# Patient Record
Sex: Male | Born: 1940 | Race: White | Hispanic: No | Marital: Married | Smoking: Former smoker
Health system: Southern US, Community
[De-identification: ages and names within clinical notes are randomized; demographics above are authoritative.]

## PROBLEM LIST (undated history)

## (undated) DIAGNOSIS — I340 Nonrheumatic mitral (valve) insufficiency: Secondary | ICD-10-CM

## (undated) DIAGNOSIS — E785 Hyperlipidemia, unspecified: Secondary | ICD-10-CM

## (undated) DIAGNOSIS — I350 Nonrheumatic aortic (valve) stenosis: Secondary | ICD-10-CM

## (undated) DIAGNOSIS — I2510 Atherosclerotic heart disease of native coronary artery without angina pectoris: Secondary | ICD-10-CM

## (undated) DIAGNOSIS — I10 Essential (primary) hypertension: Secondary | ICD-10-CM

## (undated) DIAGNOSIS — M1990 Unspecified osteoarthritis, unspecified site: Secondary | ICD-10-CM

## (undated) DIAGNOSIS — E119 Type 2 diabetes mellitus without complications: Secondary | ICD-10-CM

## (undated) DIAGNOSIS — K219 Gastro-esophageal reflux disease without esophagitis: Secondary | ICD-10-CM

## (undated) DIAGNOSIS — K259 Gastric ulcer, unspecified as acute or chronic, without hemorrhage or perforation: Secondary | ICD-10-CM

## (undated) DIAGNOSIS — I219 Acute myocardial infarction, unspecified: Secondary | ICD-10-CM

## (undated) DIAGNOSIS — D509 Iron deficiency anemia, unspecified: Secondary | ICD-10-CM

## (undated) DIAGNOSIS — I5030 Unspecified diastolic (congestive) heart failure: Secondary | ICD-10-CM

## (undated) DIAGNOSIS — F419 Anxiety disorder, unspecified: Secondary | ICD-10-CM

## (undated) DIAGNOSIS — K2970 Gastritis, unspecified, without bleeding: Secondary | ICD-10-CM

## (undated) DIAGNOSIS — G8929 Other chronic pain: Secondary | ICD-10-CM

## (undated) DIAGNOSIS — E782 Mixed hyperlipidemia: Secondary | ICD-10-CM

## (undated) DIAGNOSIS — N183 Chronic kidney disease, stage 3 unspecified: Secondary | ICD-10-CM

## (undated) DIAGNOSIS — N200 Calculus of kidney: Secondary | ICD-10-CM

## (undated) HISTORY — PX: OTHER SURGICAL HISTORY: SHX169

## (undated) HISTORY — DX: Nonrheumatic aortic (valve) stenosis: I35.0

## (undated) HISTORY — DX: Nonrheumatic mitral (valve) insufficiency: I34.0

## (undated) HISTORY — DX: Unspecified osteoarthritis, unspecified site: M19.90

## (undated) HISTORY — DX: Atherosclerotic heart disease of native coronary artery without angina pectoris: I25.10

## (undated) HISTORY — DX: Iron deficiency anemia, unspecified: D50.9

## (undated) HISTORY — DX: Gastro-esophageal reflux disease without esophagitis: K21.9

## (undated) HISTORY — DX: Gastritis, unspecified, without bleeding: K29.70

## (undated) HISTORY — PX: APPENDECTOMY: SHX54

## (undated) HISTORY — DX: Mixed hyperlipidemia: E78.2

## (undated) HISTORY — DX: Calculus of kidney: N20.0

## (undated) HISTORY — DX: Essential (primary) hypertension: I10

## (undated) HISTORY — DX: Hyperlipidemia, unspecified: E78.5

## (undated) HISTORY — DX: Unspecified diastolic (congestive) heart failure: I50.30

## (undated) HISTORY — DX: Chronic kidney disease, stage 3 unspecified: N18.30

## (undated) HISTORY — DX: Anxiety disorder, unspecified: F41.9

## (undated) HISTORY — DX: Other chronic pain: G89.29

## (undated) HISTORY — DX: Type 2 diabetes mellitus without complications: E11.9

---

## 2001-12-15 ENCOUNTER — Ambulatory Visit (HOSPITAL_COMMUNITY): Admission: RE | Admit: 2001-12-15 | Discharge: 2001-12-17 | Payer: Self-pay | Admitting: Cardiology

## 2001-12-15 ENCOUNTER — Encounter: Payer: Self-pay | Admitting: Cardiology

## 2001-12-16 ENCOUNTER — Encounter: Payer: Self-pay | Admitting: Cardiology

## 2002-01-02 HISTORY — PX: CARDIAC CATHETERIZATION: SHX172

## 2004-07-04 ENCOUNTER — Ambulatory Visit: Payer: Self-pay | Admitting: Cardiology

## 2005-06-30 ENCOUNTER — Ambulatory Visit: Payer: Self-pay | Admitting: Cardiology

## 2006-12-09 ENCOUNTER — Ambulatory Visit: Payer: Self-pay | Admitting: Cardiology

## 2006-12-20 ENCOUNTER — Encounter: Payer: Self-pay | Admitting: Physician Assistant

## 2006-12-20 ENCOUNTER — Ambulatory Visit: Payer: Self-pay | Admitting: Cardiology

## 2007-05-05 HISTORY — PX: OTHER SURGICAL HISTORY: SHX169

## 2007-12-09 ENCOUNTER — Ambulatory Visit: Payer: Self-pay | Admitting: Cardiology

## 2007-12-26 ENCOUNTER — Inpatient Hospital Stay (HOSPITAL_COMMUNITY): Admission: RE | Admit: 2007-12-26 | Discharge: 2007-12-29 | Payer: Self-pay | Admitting: Orthopedic Surgery

## 2008-02-20 ENCOUNTER — Encounter: Admission: RE | Admit: 2008-02-20 | Discharge: 2008-02-20 | Payer: Self-pay | Admitting: Orthopedic Surgery

## 2008-05-10 ENCOUNTER — Encounter (INDEPENDENT_AMBULATORY_CARE_PROVIDER_SITE_OTHER): Payer: Self-pay | Admitting: Orthopedic Surgery

## 2008-05-10 ENCOUNTER — Ambulatory Visit (HOSPITAL_COMMUNITY): Admission: RE | Admit: 2008-05-10 | Discharge: 2008-05-10 | Payer: Self-pay | Admitting: Orthopedic Surgery

## 2008-05-10 ENCOUNTER — Ambulatory Visit: Payer: Self-pay | Admitting: Vascular Surgery

## 2008-05-16 ENCOUNTER — Encounter: Payer: Self-pay | Admitting: Cardiology

## 2008-05-23 ENCOUNTER — Ambulatory Visit: Payer: Self-pay | Admitting: Gastroenterology

## 2008-06-01 ENCOUNTER — Ambulatory Visit: Payer: Self-pay | Admitting: Internal Medicine

## 2008-06-01 ENCOUNTER — Encounter: Payer: Self-pay | Admitting: Internal Medicine

## 2008-06-01 ENCOUNTER — Ambulatory Visit (HOSPITAL_COMMUNITY): Admission: RE | Admit: 2008-06-01 | Discharge: 2008-06-01 | Payer: Self-pay | Admitting: Internal Medicine

## 2008-06-11 ENCOUNTER — Ambulatory Visit (HOSPITAL_COMMUNITY): Admission: RE | Admit: 2008-06-11 | Discharge: 2008-06-11 | Payer: Self-pay | Admitting: Internal Medicine

## 2008-06-14 ENCOUNTER — Encounter: Payer: Self-pay | Admitting: Cardiology

## 2008-06-28 ENCOUNTER — Inpatient Hospital Stay (HOSPITAL_COMMUNITY): Admission: AD | Admit: 2008-06-28 | Discharge: 2008-06-30 | Payer: Self-pay | Admitting: Orthopedic Surgery

## 2008-07-03 ENCOUNTER — Encounter: Payer: Self-pay | Admitting: Cardiology

## 2008-07-23 ENCOUNTER — Encounter: Payer: Self-pay | Admitting: Cardiology

## 2008-07-26 ENCOUNTER — Encounter: Payer: Self-pay | Admitting: Cardiology

## 2008-07-30 ENCOUNTER — Encounter: Payer: Self-pay | Admitting: Cardiology

## 2008-08-17 ENCOUNTER — Encounter (INDEPENDENT_AMBULATORY_CARE_PROVIDER_SITE_OTHER): Payer: Self-pay | Admitting: *Deleted

## 2008-09-04 ENCOUNTER — Ambulatory Visit: Payer: Self-pay | Admitting: Cardiology

## 2008-11-12 ENCOUNTER — Inpatient Hospital Stay (HOSPITAL_COMMUNITY): Admission: RE | Admit: 2008-11-12 | Discharge: 2008-11-16 | Payer: Self-pay | Admitting: Orthopedic Surgery

## 2008-12-07 ENCOUNTER — Encounter: Payer: Self-pay | Admitting: Internal Medicine

## 2008-12-10 ENCOUNTER — Encounter: Payer: Self-pay | Admitting: Cardiology

## 2008-12-17 ENCOUNTER — Encounter: Payer: Self-pay | Admitting: Cardiology

## 2008-12-19 ENCOUNTER — Encounter: Payer: Self-pay | Admitting: Cardiology

## 2008-12-31 ENCOUNTER — Ambulatory Visit: Payer: Self-pay | Admitting: Infectious Diseases

## 2008-12-31 ENCOUNTER — Inpatient Hospital Stay (HOSPITAL_COMMUNITY): Admission: RE | Admit: 2008-12-31 | Discharge: 2009-01-08 | Payer: Self-pay | Admitting: Orthopedic Surgery

## 2008-12-31 ENCOUNTER — Encounter (INDEPENDENT_AMBULATORY_CARE_PROVIDER_SITE_OTHER): Payer: Self-pay | Admitting: Orthopedic Surgery

## 2009-01-04 ENCOUNTER — Encounter: Payer: Self-pay | Admitting: Internal Medicine

## 2009-01-11 ENCOUNTER — Encounter: Payer: Self-pay | Admitting: Cardiology

## 2009-02-01 ENCOUNTER — Encounter: Payer: Self-pay | Admitting: Cardiology

## 2009-02-04 ENCOUNTER — Encounter: Payer: Self-pay | Admitting: Cardiology

## 2009-02-08 ENCOUNTER — Encounter: Payer: Self-pay | Admitting: Cardiology

## 2009-02-18 DIAGNOSIS — I350 Nonrheumatic aortic (valve) stenosis: Secondary | ICD-10-CM | POA: Insufficient documentation

## 2009-04-03 ENCOUNTER — Ambulatory Visit: Payer: Self-pay | Admitting: Cardiology

## 2009-04-03 DIAGNOSIS — E782 Mixed hyperlipidemia: Secondary | ICD-10-CM | POA: Insufficient documentation

## 2009-04-03 DIAGNOSIS — I2510 Atherosclerotic heart disease of native coronary artery without angina pectoris: Secondary | ICD-10-CM | POA: Insufficient documentation

## 2009-04-03 DIAGNOSIS — I10 Essential (primary) hypertension: Secondary | ICD-10-CM | POA: Insufficient documentation

## 2009-04-05 ENCOUNTER — Encounter: Payer: Self-pay | Admitting: Cardiology

## 2009-04-10 ENCOUNTER — Telehealth (INDEPENDENT_AMBULATORY_CARE_PROVIDER_SITE_OTHER): Payer: Self-pay | Admitting: *Deleted

## 2010-08-08 LAB — CBC
HCT: 22.7 % — ABNORMAL LOW (ref 39.0–52.0)
HCT: 23.4 % — ABNORMAL LOW (ref 39.0–52.0)
HCT: 23.4 % — ABNORMAL LOW (ref 39.0–52.0)
HCT: 28 % — ABNORMAL LOW (ref 39.0–52.0)
HCT: 30.2 % — ABNORMAL LOW (ref 39.0–52.0)
Hemoglobin: 10.4 g/dL — ABNORMAL LOW (ref 13.0–17.0)
Hemoglobin: 7.7 g/dL — CL (ref 13.0–17.0)
Hemoglobin: 8.1 g/dL — ABNORMAL LOW (ref 13.0–17.0)
Hemoglobin: 8.2 g/dL — ABNORMAL LOW (ref 13.0–17.0)
Hemoglobin: 9.5 g/dL — ABNORMAL LOW (ref 13.0–17.0)
MCHC: 33.9 g/dL (ref 30.0–36.0)
MCHC: 34.7 g/dL (ref 30.0–36.0)
MCV: 91 fL (ref 78.0–100.0)
MCV: 91.2 fL (ref 78.0–100.0)
MCV: 93.2 fL (ref 78.0–100.0)
Platelets: 169 10*3/uL (ref 150–400)
Platelets: 187 10*3/uL (ref 150–400)
RBC: 2.79 MIL/uL — ABNORMAL LOW (ref 4.22–5.81)
RBC: 3.08 MIL/uL — ABNORMAL LOW (ref 4.22–5.81)
RDW: 15.3 % (ref 11.5–15.5)
RDW: 15.9 % — ABNORMAL HIGH (ref 11.5–15.5)
RDW: 16.2 % — ABNORMAL HIGH (ref 11.5–15.5)
RDW: 16.4 % — ABNORMAL HIGH (ref 11.5–15.5)
WBC: 4.8 10*3/uL (ref 4.0–10.5)
WBC: 5.7 10*3/uL (ref 4.0–10.5)
WBC: 7.5 10*3/uL (ref 4.0–10.5)

## 2010-08-08 LAB — GLUCOSE, CAPILLARY
Glucose-Capillary: 105 mg/dL — ABNORMAL HIGH (ref 70–99)
Glucose-Capillary: 111 mg/dL — ABNORMAL HIGH (ref 70–99)
Glucose-Capillary: 115 mg/dL — ABNORMAL HIGH (ref 70–99)
Glucose-Capillary: 121 mg/dL — ABNORMAL HIGH (ref 70–99)
Glucose-Capillary: 122 mg/dL — ABNORMAL HIGH (ref 70–99)
Glucose-Capillary: 143 mg/dL — ABNORMAL HIGH (ref 70–99)
Glucose-Capillary: 148 mg/dL — ABNORMAL HIGH (ref 70–99)
Glucose-Capillary: 154 mg/dL — ABNORMAL HIGH (ref 70–99)
Glucose-Capillary: 165 mg/dL — ABNORMAL HIGH (ref 70–99)
Glucose-Capillary: 173 mg/dL — ABNORMAL HIGH (ref 70–99)
Glucose-Capillary: 183 mg/dL — ABNORMAL HIGH (ref 70–99)
Glucose-Capillary: 78 mg/dL (ref 70–99)
Glucose-Capillary: 83 mg/dL (ref 70–99)
Glucose-Capillary: 98 mg/dL (ref 70–99)

## 2010-08-08 LAB — CROSSMATCH: ABO/RH(D): AB NEG

## 2010-08-08 LAB — BASIC METABOLIC PANEL
BUN: 10 mg/dL (ref 6–23)
BUN: 12 mg/dL (ref 6–23)
BUN: 17 mg/dL (ref 6–23)
CO2: 27 mEq/L (ref 19–32)
Calcium: 8.5 mg/dL (ref 8.4–10.5)
Calcium: 8.8 mg/dL (ref 8.4–10.5)
Calcium: 8.8 mg/dL (ref 8.4–10.5)
Chloride: 105 mEq/L (ref 96–112)
Chloride: 107 mEq/L (ref 96–112)
Chloride: 108 mEq/L (ref 96–112)
Creatinine, Ser: 0.87 mg/dL (ref 0.4–1.5)
GFR calc Af Amer: >60 mL/min (ref >60)
GFR calc Af Amer: >60 mL/min (ref >60)
GFR calc Af Amer: >60 mL/min (ref >60)
GFR calc non Af Amer: >60 mL/min (ref >60)
GFR calc non Af Amer: >60 mL/min (ref >60)
GFR calc non Af Amer: >60 mL/min (ref >60)
Glucose, Bld: 100 mg/dL — ABNORMAL HIGH (ref 70–99)
Glucose, Bld: 140 mg/dL — ABNORMAL HIGH (ref 70–99)
Potassium: 4.1 mEq/L (ref 3.5–5.1)
Potassium: 4.1 mEq/L (ref 3.5–5.1)
Potassium: 4.4 mEq/L (ref 3.5–5.1)
Potassium: 4.6 mEq/L (ref 3.5–5.1)
Sodium: 137 mEq/L (ref 135–145)
Sodium: 138 mEq/L (ref 135–145)
Sodium: 139 mEq/L (ref 135–145)
Sodium: 140 mEq/L (ref 135–145)

## 2010-08-08 LAB — PROTIME-INR
INR: 1.1 (ref 0.00–1.49)
INR: 1.1 (ref 0.00–1.49)
INR: 2.9 — ABNORMAL HIGH (ref 0.00–1.49)
Prothrombin Time: 14.2 seconds (ref 11.6–15.2)
Prothrombin Time: 29.9 seconds — ABNORMAL HIGH (ref 11.6–15.2)

## 2010-08-08 LAB — PREPARE RBC (CROSSMATCH)

## 2010-08-08 LAB — APTT: aPTT: 42 seconds — ABNORMAL HIGH (ref 24–37)

## 2010-08-09 LAB — BASIC METABOLIC PANEL
BUN: 26 mg/dL — ABNORMAL HIGH (ref 6–23)
Chloride: 105 mEq/L (ref 96–112)
Creatinine, Ser: 1.19 mg/dL (ref 0.4–1.5)
Glucose, Bld: 120 mg/dL — ABNORMAL HIGH (ref 70–99)

## 2010-08-09 LAB — GLUCOSE, CAPILLARY
Glucose-Capillary: 114 mg/dL — ABNORMAL HIGH (ref 70–99)
Glucose-Capillary: 169 mg/dL — ABNORMAL HIGH (ref 70–99)
Glucose-Capillary: 171 mg/dL — ABNORMAL HIGH (ref 70–99)
Glucose-Capillary: 94 mg/dL (ref 70–99)
Glucose-Capillary: 99 mg/dL (ref 70–99)
Glucose-Capillary: 99 mg/dL (ref 70–99)

## 2010-08-09 LAB — TYPE AND SCREEN

## 2010-08-09 LAB — URINALYSIS, ROUTINE W REFLEX MICROSCOPIC
Ketones, ur: NEGATIVE mg/dL
Nitrite: NEGATIVE
Protein, ur: NEGATIVE mg/dL
Urobilinogen, UA: 0.2 mg/dL (ref 0.0–1.0)
pH: 5.5 (ref 5.0–8.0)

## 2010-08-09 LAB — ANAEROBIC CULTURE

## 2010-08-09 LAB — CBC
HCT: 24.4 % — ABNORMAL LOW (ref 39.0–52.0)
MCHC: 33.7 g/dL (ref 30.0–36.0)
MCHC: 33.7 g/dL (ref 30.0–36.0)
MCV: 89.9 fL (ref 78.0–100.0)
MCV: 90.2 fL (ref 78.0–100.0)
Platelets: 244 10*3/uL (ref 150–400)
RBC: 3.71 MIL/uL — ABNORMAL LOW (ref 4.22–5.81)
RDW: 17 % — ABNORMAL HIGH (ref 11.5–15.5)
WBC: 8.8 10*3/uL (ref 4.0–10.5)

## 2010-08-09 LAB — COMPREHENSIVE METABOLIC PANEL
AST: 21 U/L (ref 0–37)
CO2: 31 mEq/L (ref 19–32)
Calcium: 9.4 mg/dL (ref 8.4–10.5)
Creatinine, Ser: 1.03 mg/dL (ref 0.4–1.5)
GFR calc Af Amer: >60 mL/min (ref >60)
GFR calc non Af Amer: >60 mL/min (ref >60)

## 2010-08-09 LAB — BODY FLUID CULTURE

## 2010-08-09 LAB — APTT: aPTT: 29 seconds (ref 24–37)

## 2010-08-09 LAB — PROTIME-INR: Prothrombin Time: 13 seconds (ref 11.6–15.2)

## 2010-08-09 LAB — GRAM STAIN

## 2010-08-10 LAB — CBC
HCT: 23 % — ABNORMAL LOW (ref 39.0–52.0)
HCT: 28.9 % — ABNORMAL LOW (ref 39.0–52.0)
Hemoglobin: 9.1 g/dL — ABNORMAL LOW (ref 13.0–17.0)
Hemoglobin: 9.6 g/dL — ABNORMAL LOW (ref 13.0–17.0)
MCHC: 33.1 g/dL (ref 30.0–36.0)
MCHC: 33.6 g/dL (ref 30.0–36.0)
MCHC: 34 g/dL (ref 30.0–36.0)
MCV: 84.8 fL (ref 78.0–100.0)
MCV: 85.8 fL (ref 78.0–100.0)
Platelets: 290 10*3/uL (ref 150–400)
Platelets: 341 10*3/uL (ref 150–400)
RBC: 3.46 MIL/uL — ABNORMAL LOW (ref 4.22–5.81)
RDW: 17 % — ABNORMAL HIGH (ref 11.5–15.5)
RDW: 17.3 % — ABNORMAL HIGH (ref 11.5–15.5)
WBC: 7.9 10*3/uL (ref 4.0–10.5)
WBC: 8.3 10*3/uL (ref 4.0–10.5)

## 2010-08-10 LAB — CROSSMATCH

## 2010-08-10 LAB — PROTIME-INR
INR: 1.1 (ref 0.00–1.49)
INR: 2.6 — ABNORMAL HIGH (ref 0.00–1.49)
Prothrombin Time: 33.2 seconds — ABNORMAL HIGH (ref 11.6–15.2)
Prothrombin Time: 14.2 seconds (ref 11.6–15.2)
Prothrombin Time: 15.3 seconds — ABNORMAL HIGH (ref 11.6–15.2)
Prothrombin Time: 29.6 seconds — ABNORMAL HIGH (ref 11.6–15.2)

## 2010-08-10 LAB — URINALYSIS, ROUTINE W REFLEX MICROSCOPIC
Bilirubin Urine: NEGATIVE
Hgb urine dipstick: NEGATIVE
Ketones, ur: NEGATIVE mg/dL
Protein, ur: NEGATIVE mg/dL
Urobilinogen, UA: 0.2 mg/dL (ref 0.0–1.0)
pH: 5.5 (ref 5.0–8.0)

## 2010-08-10 LAB — GLUCOSE, CAPILLARY
Glucose-Capillary: 100 mg/dL — ABNORMAL HIGH (ref 70–99)
Glucose-Capillary: 123 mg/dL — ABNORMAL HIGH (ref 70–99)
Glucose-Capillary: 152 mg/dL — ABNORMAL HIGH (ref 70–99)
Glucose-Capillary: 157 mg/dL — ABNORMAL HIGH (ref 70–99)
Glucose-Capillary: 180 mg/dL — ABNORMAL HIGH (ref 70–99)

## 2010-08-10 LAB — COMPREHENSIVE METABOLIC PANEL
Alkaline Phosphatase: 101 U/L (ref 39–117)
BUN: 26 mg/dL — ABNORMAL HIGH (ref 6–23)
CO2: 29 mEq/L (ref 19–32)
Calcium: 9.2 mg/dL (ref 8.4–10.5)
Chloride: 102 mEq/L (ref 96–112)
Creatinine, Ser: 0.95 mg/dL (ref 0.4–1.5)
GFR calc non Af Amer: >60 mL/min (ref >60)
Glucose, Bld: 226 mg/dL — ABNORMAL HIGH (ref 70–99)
Potassium: 5 mEq/L (ref 3.5–5.1)
Sodium: 139 mEq/L (ref 135–145)
Total Bilirubin: 0.3 mg/dL (ref 0.3–1.2)
Total Protein: 6.9 g/dL (ref 6.0–8.3)

## 2010-08-10 LAB — BASIC METABOLIC PANEL
BUN: 13 mg/dL (ref 6–23)
BUN: 15 mg/dL (ref 6–23)
CO2: 28 mEq/L (ref 19–32)
CO2: 29 mEq/L (ref 19–32)
Calcium: 8.9 mg/dL (ref 8.4–10.5)
Calcium: 9.3 mg/dL (ref 8.4–10.5)
Chloride: 105 mEq/L (ref 96–112)
Creatinine, Ser: 0.78 mg/dL (ref 0.4–1.5)
Creatinine, Ser: 0.97 mg/dL (ref 0.4–1.5)
GFR calc Af Amer: >60 mL/min (ref >60)
GFR calc non Af Amer: >60 mL/min (ref >60)
Glucose, Bld: 144 mg/dL — ABNORMAL HIGH (ref 70–99)
Glucose, Bld: 86 mg/dL (ref 70–99)
Potassium: 4 mEq/L (ref 3.5–5.1)

## 2010-08-10 LAB — ANAEROBIC CULTURE

## 2010-08-10 LAB — CULTURE, ROUTINE-ABSCESS

## 2010-08-10 LAB — TISSUE CULTURE

## 2010-08-10 LAB — VANCOMYCIN, TROUGH: Vancomycin Tr: 11.6 ug/mL (ref 10.0–20.0)

## 2010-08-18 LAB — GLUCOSE, CAPILLARY: Glucose-Capillary: 131 mg/dL — ABNORMAL HIGH (ref 70–99)

## 2010-08-19 LAB — CBC
HCT: 28 % — ABNORMAL LOW (ref 39.0–52.0)
Hemoglobin: 8 g/dL — ABNORMAL LOW (ref 13.0–17.0)
Hemoglobin: 9.3 g/dL — ABNORMAL LOW (ref 13.0–17.0)
Hemoglobin: 9.6 g/dL — ABNORMAL LOW (ref 13.0–17.0)
MCHC: 32.5 g/dL (ref 30.0–36.0)
MCHC: 32.5 g/dL (ref 30.0–36.0)
MCV: 80.5 fL (ref 78.0–100.0)
MCV: 82 fL (ref 78.0–100.0)
Platelets: 314 10*3/uL (ref 150–400)
Platelets: 398 10*3/uL (ref 150–400)
RBC: 3.04 MIL/uL — ABNORMAL LOW (ref 4.22–5.81)
RDW: 19 % — ABNORMAL HIGH (ref 11.5–15.5)
RDW: 19.1 % — ABNORMAL HIGH (ref 11.5–15.5)
RDW: 20.2 % — ABNORMAL HIGH (ref 11.5–15.5)
WBC: 8.1 10*3/uL (ref 4.0–10.5)

## 2010-08-19 LAB — BASIC METABOLIC PANEL
BUN: 14 mg/dL (ref 6–23)
CO2: 28 mEq/L (ref 19–32)
CO2: 29 mEq/L (ref 19–32)
Calcium: 8.7 mg/dL (ref 8.4–10.5)
Calcium: 8.7 mg/dL (ref 8.4–10.5)
Chloride: 101 mEq/L (ref 96–112)
Chloride: 103 mEq/L (ref 96–112)
Creatinine, Ser: 0.85 mg/dL (ref 0.4–1.5)
Creatinine, Ser: 0.88 mg/dL (ref 0.4–1.5)
GFR calc Af Amer: >60 mL/min (ref >60)
GFR calc non Af Amer: >60 mL/min (ref >60)
GFR calc non Af Amer: >60 mL/min (ref >60)
Glucose, Bld: 66 mg/dL — ABNORMAL LOW (ref 70–99)
Glucose, Bld: 82 mg/dL (ref 70–99)
Potassium: 3.9 mEq/L (ref 3.5–5.1)
Sodium: 136 mEq/L (ref 135–145)
Sodium: 136 mEq/L (ref 135–145)

## 2010-08-19 LAB — COMPREHENSIVE METABOLIC PANEL
AST: 15 U/L (ref 0–37)
Albumin: 2.9 g/dL — ABNORMAL LOW (ref 3.5–5.2)
BUN: 18 mg/dL (ref 6–23)
Calcium: 9.4 mg/dL (ref 8.4–10.5)
Creatinine, Ser: 0.84 mg/dL (ref 0.4–1.5)
GFR calc Af Amer: >60 mL/min (ref >60)
GFR calc non Af Amer: >60 mL/min (ref >60)

## 2010-08-19 LAB — WOUND CULTURE: Culture: NO GROWTH

## 2010-08-19 LAB — CROSSMATCH: ABO/RH(D): AB NEG

## 2010-08-19 LAB — URINALYSIS, ROUTINE W REFLEX MICROSCOPIC
Bilirubin Urine: NEGATIVE
Hgb urine dipstick: NEGATIVE
Ketones, ur: NEGATIVE mg/dL
Protein, ur: NEGATIVE mg/dL
Urobilinogen, UA: 0.2 mg/dL (ref 0.0–1.0)

## 2010-08-19 LAB — GLUCOSE, CAPILLARY
Glucose-Capillary: 101 mg/dL — ABNORMAL HIGH (ref 70–99)
Glucose-Capillary: 140 mg/dL — ABNORMAL HIGH (ref 70–99)
Glucose-Capillary: 161 mg/dL — ABNORMAL HIGH (ref 70–99)
Glucose-Capillary: 163 mg/dL — ABNORMAL HIGH (ref 70–99)
Glucose-Capillary: 76 mg/dL (ref 70–99)
Glucose-Capillary: 89 mg/dL (ref 70–99)
Glucose-Capillary: 92 mg/dL (ref 70–99)

## 2010-08-19 LAB — ANAEROBIC CULTURE

## 2010-08-19 LAB — PROTIME-INR: INR: 1.1 (ref 0.00–1.49)

## 2010-08-19 LAB — APTT: aPTT: 46 seconds — ABNORMAL HIGH (ref 24–37)

## 2010-08-19 LAB — HEMOCCULT GUIAC POC 1CARD (OFFICE): Fecal Occult Bld: NEGATIVE

## 2010-08-19 LAB — CREATININE, SERUM
Creatinine, Ser: 0.92 mg/dL (ref 0.4–1.5)
GFR calc Af Amer: >60 mL/min (ref >60)

## 2010-09-16 NOTE — Assessment & Plan Note (Signed)
[facility]                          [facility] OFF[facility]   Bradley Sheppard                     MRN:          [id number]  DATE:12/09/2006                            DOB:          07-Dec-1940    PRIMARY CARDIOLOGIST:  Dr. Simona Huh.   PRIMARY CARE PHYSICIAN:  Dr. Doreen Beam.   REASON FOR APPOINTMENT:  Routine follow up.   HISTORY OF PRESENT ILLNESS:  [name] is a very pleasant 70 year old  male patient with a history of multivessel coronary artery disease  status post drug eluting stent placement to the mid-LAD and proximal RCA  in August 2003, who returns to the office today for follow up. Over the  last year, he has been doing well without any complaints of chest  discomfort or significant shortness of breath. He does not some  occasional indigestion. He denies any exertional chest heaviness or  tightness. He denies any syncope or near-syncope. He denies any  palpitations. He denies any orthopnea, paroxysmal nocturnal dyspnea, or  pedal edema. He does have significant problems with osteoarthritis and  has been told by multiple doctors that he needs a knee replacement on  the left. He is still unsure whether or not he wants to pursue this. He  has had some cramping as well and Dr. Sherril Croon is apparently checking lab  work. Dr. Sherril Croon follows his lipid management. Apparently, the patient has  been told that he may be taken off of his statin should his cholesterol  panel return normal.   CURRENT MEDICATIONS:  1. Metoprolol 50 mg b.i.d.  2. Altace 5 mg daily.  3. Glucotrol XL 5 mg b.i.d.  4. Vytorin 10/40 mg daily.  5. Metformin 500 mg b.i.d.  6. Aspirin 81 mg daily.  7. Actos 45 mg daily.   ALLERGIES:  No known drug allergies.   PHYSICAL EXAMINATION:  GENERAL:  He is a well developed, well nourished  male in no distress.  VITAL SIGNS:  Blood pressure 138/76, pulse 63, weight 222 pounds.  HEENT:  Normal.  NECK:  Without JVD.  LYMPH:  Without lymphadenopathy.  CAROTIDS:  Without bruits bilaterally.  CARDIAC:  Normal S1, S2. Regular rate and rhythm with a short soft  systolic murmur heard best at the right upper sternal border.  LUNGS:  Clear to auscultation bilaterally without wheezing, rhonchi, or  rales.  ABDOMEN:  Soft and nontender with normal active bowel sounds. No  organomegaly.  EXTREMITIES:  Without edema.  CALVES:  Soft and nontender.  SKIN:  Warm and dry.  NEUROLOGIC:  He is awake, alert, and oriented x3. Cranial nerves II-XII  are intact.   Electrocardiogram shows sinus rhythm with a heart rate of 61. Left axis  deviation PACs. First degree AV block with a PR interval of 222  milliseconds.   IMPRESSION:  1. Coronary artery disease.      a.     Status post Cypher drug eluting stent placement to the mid-       LAD and proximal RCA in August 2003.      b.     Residual  CAD at that time, LAD proximal 30%, circumflex       proximal 50% to 60%, RCA mid to distal 30% - treated medically.      c.     Last Cardiolite study 2005: Partially reversible defect in       the anterior apical and intraseptal wall consistent with prior       infarct or scar with some residual ischemia - low risk study -       medical therapy.  2. Preserved LV function.  3. Diabetes mellitus.  4. Treated dyslipidemia.  5. Osteoarthritis.  6. Systolic murmur.   PLAN:  The patient presents to the office today for routine follow up.  Overall, he is doing well. He does have a systolic murmur. He apparently  has had some thickening of his mitral valve in the past. He also tells  me that he has been having some cramping and Dr. Sherril Croon plans on some  blood work to follow up on that. At this point in time we plan to:  1. Proceed with Adenosine Cardiolite testing to ensure patency of his      stents.  2. 2D echocardiogram to further evaluate his systolic murmur.  3. Follow up in one year or sooner prn.  4. I explained to the  patient that he should remain on statin therapy      given his history of coronary disease. If his cramping should stop      once his statin is stopped, then he should follow up with Dr. Sherril Croon      to consider a different type of statin. If his cramping does not      abate      with discontinuation of his statin, then he should go back on the      Vytorin. Further follow up on his lipids will be per Dr. Sherril Croon.      Bradley Newcomer, PA-C  Electronically Signed      Bradley Sidle, MD  Electronically Signed   SW/MedQ  DD: 12/09/2006  DT: 12/10/2006  Job #: [id number]   cc:   Doreen Beam

## 2010-09-16 NOTE — Assessment & Plan Note (Signed)
[facility]                          [facility] NOTE   DAKIN, MADANI                     MRN:          [id number]  DATE:09/04/2008                            DOB:          March 09, 1941    PRIMARY CARE PHYSICIAN:  Doreen Beam, MD.   ORTHOPEDIC SURGEON:  Ollen Gross, M.D.   REASON FOR VISIT:  Routine cardiac followup.   HISTORY OF PRESENT ILLNESS:  [name] was last seen in August 2009.  He has subsequently undergone left total knee replacement including a  followup revision this year due to infection.  He continues to have  close follow up with Dr. Lequita Halt and had some recent swelling in his  knee, although no fevers or chills.  From a cardiac perspective, he is  not reporting any problems of angina or limiting breathlessness.  He has  been taking his medicines regularly.  He denies any major sense of  palpitations or dizziness.  He is due to follow up with labs over the  next few weeks of Dr. Sherril Croon to have his lipids reassessed.  He continues  on Vytorin.   ALLERGIES:  No known drug allergies.   MEDICATIONS:  1. Metoprolol 50 mg p.o. b.i.d.  2. Metformin 500 mg p.o. b.i.d.  3. Aspirin 81 mg p.o. daily.  4. Actos 45 mg p.o. daily.  5. Altace 10 mg p.o. daily.  6. Glucotrol XL 5 mg tabs p.o. q.a.m. and 1 tablet p.o. q.p.m.  7. Vytorin 10/20 mg p.o. daily.  8. Cyclobenzaprine 10 mg p.o. b.i.d.  9. Ferrous sulfate 325 mg p.o. t.i.d.  10.Lorazepam p.r.n.  11.Hydrocodone p.r.n.  12.Sublingual nitroglycerin 0.4 mg p.r.n.  13.Cyclobenzaprine p.r.n.   REVIEW OF SYSTEMS:  As outlined above.  He has had some fatigue.  He  also had some problems with recurrent anemia, mainly in the setting of  his knee infection and reportedly underwent endoscopy and colonoscopy  with Dr. Benard Rink back in January without major bleeding source.  He had  some polyps removed at that time.  He has had no melena or hematochezia.  Otherwise reviewed  negative.   PHYSICAL EXAMINATION:  VITAL SIGNS:  Blood pressure today is 129/64,  heart rate is 61, weight 210 pounds, which is down from 220, his last  visit.  GENERAL:  The patient is comfortable, chronically ill-appearing, in no  acute distress.  NECK:  Reveals no elevated jugular venous pressure.  No loud bruits.  No  thyromegaly is noted.  LUNGS:  Clear with diminished breath sounds.  CARDIAC:  Reveals a regular rate and rhythm, 2/6 systolic murmur at the  base, occasional ectopic beats.  No S3 or gallop.  ABDOMEN:  Soft and  nontender.  EXTREMITIES:  Exhibit trace edema, left greater than right.  Knee joint  edema is noted.  Some venous stasis.  Distal pulses are 1+.  SKIN:  Warm  and dry.  MUSCULOSKELETAL:  No kyphosis noted.  NEUROPSYCHIATRIC:  Alert and oriented x3.  Affect is appropriate.   IMPRESSION AND RECOMMENDATIONS:  1. Multivessel cardiovascular disease status post drug-eluting stent  placement to the mid left anterior descending and proximal right      coronary artery in August 2003.  Cardiolite in August 2008      demonstrated no active ischemia and the patient remained      symptomatically stable.  We will plan to continue medical therapy.      Refills were given for sublingual nitroglycerin as his present      bottle is old (has not used any of these).  Blood pressure and      heart rate are fairly well-controlled at this time.  2. Hyperlipidemia, on Vytorin.  The patient is due for followup lipid      profile and liver function tests.  He is planning to see Dr. Sherril Croon      over the next few weeks.     [name], MD  Electronically Signed    SGM/MedQ  DD: 09/04/2008  DT: 09/05/2008  Job #: [id number]   cc:   Doreen Beam, MD  Ollen Gross, M.D.

## 2010-09-16 NOTE — Assessment & Plan Note (Signed)
[facility]                          [facility] NOTE   Bradley Sheppard, Bradley Sheppard                     MRN:          [id number]  DATE:12/09/2007                            DOB:          [birth year]    PRIMARY CARE PHYSICIAN:  Doreen Beam, MD   ORTHOPEDIC SURGEON:  Ollen Gross, MD   REASON FOR VISIT:  Preoperative evaluation.   HISTORY OF PRESENT ILLNESS:  Bradley Sheppard was last seen in August 2008.  He has a history of multivessel cardiovascular disease, status post drug-  eluting stent placement to the mid left anterior descending and proximal  right coronary artery in August 2003.  Following his last visit, he was  referred for ischemic evaluation and underwent an adenosine Cardiolite,  which demonstrated no significant evidence of ischemia with an overall  normal ejection fraction of 52%.  Echocardiography at that time also  revealed normal ventricular systolic function with mild-to-moderate left  ventricular hypertrophy and mild aortic valve sclerosis without  stenosis.  Symptomatically, he has been stable without any obvious  angina or heart failure symptoms.  He does feel a sense of palpitations  sometimes in the evenings when he is still but nothing with exertion.  He has had no dizziness or syncope.  His electrocardiogram shows sinus  rhythm with atrial bigeminy, and this has been noted previously by prior  tracings.  Otherwise, no significant ST-T wave changes are noted  compared to 2008.  He is being considered for a left total knee  replacement and has anesthesia followup pending with surgery date  scheduled for December 26, 2007.  He has been compliant with his  medications.   ALLERGIES:  No known drug allergies.   MEDICATIONS:  1. Metoprolol 50 mg p.o. b.i.d.  2. Metformin 500 mg p.o. b.i.d.  3. Aspirin 81 mg p.o. daily.  4. Actos 45 mg p.o. daily.  5. Altace 10 mg p.o. daily.  6. Glucotrol XL 10 mg p.o. q.a.m. and 5 mg p.o.  q.p.m.  7. Vytorin 10/20 mg p.o. daily.  8. Sublingual nitroglycerin p.r.n.   REVIEW OF SYSTEMS:  As described in history of present illness.  He has  chronic knee pain which limits his ambulation, general fatigue.  No  dizziness or syncope.  Otherwise negative.   PHYSICAL EXAMINATION:  VITAL SIGNS:  Blood pressure is 138/73, heart  rate is 62, and weight is 222 pounds.  GENERAL:  The patient is comfortable in no acute distress.  HEENT:  Conjunctiva is normal.  Oropharynx clear.  NECK:  Supple.  No loud bruits.  LUNGS:  Clear with diminished breath sounds.  CARDIAC:  Regular rate with ectopic beats, soft systolic murmur,  preserved second heart sound.  No S3 gallop.  ABDOMEN:  Soft, nontender.  EXTREMITIES:  No frank pitting edema.   IMPRESSION AND RECOMMENDATIONS:  1. History of multivessel cardiovascular disease, status post drug-      eluting stent placement to the mid left anterior descending and      proximal right coronary artery in August 2003.  A followup      Cardiolite showed  no ischemia or scar with overall normal ejection      fraction as of August 2008.  He has had no new symptoms since that      time and I would expect that he should be able to proceed with left      knee replacement with an acceptable perioperative cardiac risk.      His medical regimen is reasonable and includes a beta-blocker which      I would clearly continue.  He has had atrial bigeminy noted and      telemetry monitoring could be considered in the perioperative      period particularly if he manifests an irregular heartbeat.  Mode      of anesthesia is not yet determined, I presume either general or      epidural.  Our cardiology service can certainly see him as an      inpatient if required.  His surgery is scheduled for the December 26, 2007, at [facility].  2. Otherwise we will plan general cardiology followup over the next 6      months.     [name], MD   Electronically Signed    SGM/MedQ  DD: 12/09/2007  DT: 12/10/2007  Job #: [id number]   cc:   Doreen Beam, MD  Ollen Gross, M.D.

## 2010-09-16 NOTE — Discharge Summary (Signed)
NAME:  OSCAR, HANK NO.:  [id number]   MEDICAL RECORD NO.:  [id number]          PATIENT TYPE:  INP   LOCATION:  1607                         FACILITY:  [facility]   PHYSICIAN:  Ollen Gross, M.D.    DATE OF BIRTH:  03-Apr-1941   DATE OF ADMISSION:  11/12/2008  DATE OF DISCHARGE:  11/16/2008                               DISCHARGE SUMMARY   ADMITTING DIAGNOSES:  1. Infected left total knee arthroplasty.  2. Hypertension.  3. Multivessel coronary arterial disease.  4. Cardiac murmur.  5. Hypercholesterolemia.  6. Past history of gastric ulcerations secondary to non-steroidal anti-      inflammatory drugs.  7. Diabetes.  8. History of renal calculi.   DISCHARGE DIAGNOSES:  1. Infected left total knee arthroplasty, status post left knee      resection arthroplasty with placement of antibiotic spacer.  2. Postoperative acute blood loss anemia.  3. Status post transfusion without sequelae.   Remaining discharge diagnoses same as admitting diagnoses.   PROCEDURE:  November 12, 2008 left knee resection arthroplasty with a  placement of antibiotic spacer.   SURGEON:  Dr. Lequita Halt.   ASSISTANT:  Avel Peace, PA-C.   Surgery performed under general anesthesia.  Tourniquet time 60 minutes.   CONSULTS:  None.   BRIEF HISTORY:  Mr. Kemler is a 70 year old male well-known to Dr.  Lequita Halt, unfortunately having an infected left total knee.  He had  undergone an I and D and poly swap in the past but had a recurrence of  his infection.  He has now presented for resection arthroplasty.   LABORATORY DATA:  Preoperative CBC showed a hemoglobin low at 9.6,  hematocrit of 28.9, white cell count 7.9, platelets 353.  PT/INR  preoperative 14.2/1 with PTT of 48.  Chem panel on admission:  Elevated  glucose at 226, mildly elevated BUN at 26, remaining Chem panel within  normal limits.  Serial CBCs were followed, hemoglobin dropped down to  9.1 then got as low as 7.8, given 2  units of blood.  Port transfusion  hemoglobin back up to 10.1 with crit of 30.  Serial ProTimes followed  per Coumadin protocol, last known PT/INR 33.2 and 3.  Serial BMETs were  followed, checked for three days, BMETs all within normal limits.  Glucose actually came down to a normal level at 99.  Electrolytes  remained within normal limits.   EKG November 06, 2008:  Normal sinus rhythm, premature atrial complexes, left  anterior fascicular block, no significant change since last tracing,  confirmed by Dr. Rollene Rotunda.   HOSPITAL COURSE:  The patient admitted to [facility], taken to  OR and underwent above-stated procedure without complication.  The  patient tolerated the procedure well, later transferred to the recovery  room orthopedic floor.  Started back on his IV antibiotics  postoperative.  We did order a PICC line to be placed because he would  need home antibiotics.  Blood pressure meds were restarted.  Did have a  history of heart disease, so sublingual nitro was added as a p.r.n.  Was  started back  on his home medications.  We did hold his metformin for at  least 48 hours to make sure he was eating and drinking well.  He had a  history of anemia and the hemoglobin was low coming in but he was stable  postoperative day one, we did put him on iron supplementation.  Because  he is a resection arthroplasty we made sure we did no motion and no  bending since he had an antibiotic spacer in his knee.  Started getting  up out of bed on day one, actually walked about 20 feet on day one.  By  day two the patient was very drowsy, awakens easily but wife states he  usually got that when his blood count was down.  He had a previous  history of anemia and his blood count was low as 7.8 so we did give him  2 units of blood, tolerated the blood well.  Dressing was changed also  that day.  He had some soft tissue subcutaneous swelling but no intra-  articular large effusion.  Incision  was healing well.  __________ drain  and the pain pump which was placed at the time of surgery was moved  without difficulty.  He did receive the blood and tolerated the blood  very well, by day three he looked much better and is back to his  baseline, much more alert and in good spirits.  Hemoglobin is back up to  10 after a transfusion.  Incision looked good, staples intact.  His INR  was already up to therapeutic.  He still having difficulty with his  transfer so we discussed possible need for a short-term skilled  facility.  He was in agreement with this so on postoperative day three  of November 15, 2008 we did order social work to start looking into a  skilled facility.  By the following day of postoperative day four, November 16, 2008, the patient was doing well.  He was ambulating well but still  having difficulty with transfers and initial mobility within the room.  It was felt he would be a good candidate for a skilled nurse facility  and wanted to look into certain facilities.  At time of dictation bed  was being sought.  If a bed did become available on Friday the 16th we  were going to go ahead allow the patient be transferred at that time,  otherwise will hold his transfer pending bed availability.   DISCHARGE PLAN:  Tentative date of discharge today's date, November 16, 2008, pending on bed.   DISCHARGE DIAGNOSES:  Please see above.   DISCHARGE MEDICATIONS:  Current medications include:  1. Vancomycin protocol, currently receiving 1 gram every 12 hours.  He      will need to be on IV antibiotics in the form of vancomycin for 6      weeks via PICC line.  2. Coumadin protocol, please titrate the Coumadin level for a target      INR between 2 and 3.  He needs to be on Coumadin for a minimum of 3      weeks postoperative from the date of surgery of November 12, 2008.  3. Colace 100 mg p.o. b.i.d.  4. Ramipril 10 mg p.o. q.a.m.  5. Metoprolol 50 mg p.o. b.i.d.  6. Vytorin 10/20 p.o.  nightly.  7. Actos 45 mg p.o. daily.  8. Glipizide XL, he is on 10 mg in the a.m. and 5 mg in the  p.m.  9. He can resume his metformin 500 mg p.o. b.i.d.  10.Ativan 1 mg p.o. nightly p.r.n.  11.Nu-Iron 150 mg p.o. daily for 3 weeks then discontinue the Nu-Iron.  12.Prevacid 30 mg p.o. q.a.m.  13.Tylenol 325 one or two every 4 - 6 hours as needed for mild pain,      temp or headache.  14.Laxative of choice.  15.Enema of choice.  16.Robaxin 500 mg p.o. q.6 - 8 hours p.r.n. spasm.  17.Percocet 5 mg one or two every 4 hours as needed for pain.  18.Sublingual nitro 0.4 q.5 minutes p.r.n. chest pain.   DIET:  Low cholesterol, heart healthy diet.   ACTIVITY:  He can be weightbearing as tolerated to the left lower  extremity, although he has an antibiotic spacer in so there is no mobile  knee, so NO MOTION AND NO BENDING TO THE KNEE.  Again no motion and no  bending to the knee.  He must wear the knee immobilizer at all times.  The only reason to loosen that up is for hygiene and bathing.  Do not  submerge the incision under water.  He may shower.  He may be  weightbearing as tolerated to the left lower extremity but again no  bending and no motion.  He may do straight leg raises, bed exercises,  but weightbearing as tolerated within the knee immobilizer itself.   FOLLOWUP:  He needs to follow up with Dr. Lequita Halt in the office next  Thursday on July 22, please contact the office at [phone] to help  arrange appointment, follow-up and transfer this patient over for  further care.   DISPOSITION:  Pending at this time of dictation, we are waiting on bed  offers.   CONDITION ON DISCHARGE:  If bed is available may allow transfer since he  is improving.      [name], P.A.C.      Ollen Gross, M.D.  Electronically Signed    ALP/MEDQ  D:  11/16/2008  T:  11/16/2008  Job:  [id number]   cc:   Ollen Gross, M.D.  Fax: [phone]   [name], MD  [phone]  [place], [place] [address]   Doreen Beam, MD  Fax: [phone]   Skilled Nursing Facility of Choice

## 2010-09-16 NOTE — Op Note (Signed)
NAME:  Bradley Sheppard, Bradley Sheppard NO.:  [id number]   MEDICAL RECORD NO.:  [id number]          PATIENT TYPE:  OIB   LOCATION:  1607                         FACILITY:  [facility]   PHYSICIAN:  Ollen Gross, M.D.    DATE OF BIRTH:  [birth year]   DATE OF PROCEDURE:  06/27/2008  DATE OF DISCHARGE:                               OPERATIVE REPORT   PREOPERATIVE DIAGNOSIS:  Infected left total knee arthroplasty.   POSTOPERATIVE DIAGNOSIS:  Infected left total knee arthroplasty.   PROCEDURE:  Left knee arthrotomy, irrigation and debridement and  polyethylene exchange.   SURGEON:  Dr. Lequita Halt.   ASSISTANT:  Avel Peace, PA-C.   ANESTHESIA:  General.   ESTIMATED BLOOD LOSS:  Minimal.   DRAIN:  Hemovac x1.   TOURNIQUET TIME:  29 minutes at 300 mmHg.   COMPLICATIONS:  None.   CONDITION:  Stable to recovery.   CLINICAL NOTE:  Bradley Sheppard is a 70 year old male who had a left total  knee arthroplasty done on December 26, 2007.  He did very well initially  and recently he has developed pain and swelling.  He has had an  aspiration which was negative for bacteria, but had an elevated white  count.  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  The workup has been completed and he continues to  complain of pain and swelling.  He presents now for arthrotomy,  irrigation and debridement and polyethylene exchange.   PROCEDURE IN DETAIL:  After the successful administration of general  anesthetic, a tourniquet was placed on his left thigh and his left lower  extremity prepped and draped in the usual sterile fashion.  The  extremity was wrapped in Esmarch, knee flexed, tourniquet inflated to  300 mmHg.  A midline incision was made with a 10 blade through  subcutaneous tissue to the level of the extensor mechanism.  A fresh  blade was used make a medial parapatellar arthrotomy.  He had a lot of  yellowish fluid in the joint which was sent for gram stain,  cultures and  sensitivity.  There was also some necrotic debris.  I did a thorough  synovectomy and scar excision medially, laterally and superiorly.  The  soft tissue in proximal and medial tibia was subperiosteally elevated to  the joint line with the knife and into the semimembranosus bursa with a  Cobb elevator.  I then flexed the knee 90 degrees, subluxed the patella  laterally and removed the tibial polyethylene.  I then did a synovectomy  on the posterior tissues of the knee.  All the tissue had a healthy  appearance that was left.  The components were tested and were very  stable in the bone.  There was no loosening.  I then thoroughly  irrigated the joint with 3 liters of saline using pulsatile lavage.  I  then subluxed tibia forward and replaced the polyethylene insert which  was a size 5 with a 15 mm thickness.  We reduced the knee and he had  great stability, anterior-posterior and varus-valgus  throughout full  range of motion.  He had full extension.  I inspected the joint and no  other signs of any inflamed or abnormal tissues.  We then closed the  arthrotomy over a Hemovac drain with interrupted #1 PDS.  Flexion  against gravity was about 125 degrees.  We released the tourniquet for  total time of about 29 minutes.  The subcu was then closed with  interrupted 2-0 Vicryl and the skin with staples.  The drains was hooked  to suction.  The incision cleaned and dried and a bulky sterile dressing  applied.  He was placed into a knee immobilizer, awakened and  transported to recovery in stable condition.      Ollen Gross, M.D.  Electronically Signed     FA/MEDQ  D:  06/27/2008  T:  06/28/2008  Job:  [id number]

## 2010-09-16 NOTE — Op Note (Signed)
NAME:  Bradley Sheppard, Bradley Sheppard NO.:  [id number]   MEDICAL RECORD NO.:  [id number]          PATIENT TYPE:  INP   LOCATION:  0001                         FACILITY:  [facility]   PHYSICIAN:  Ollen Gross, M.D.    DATE OF BIRTH:  12-23-1940   DATE OF PROCEDURE:  12/31/2008  DATE OF DISCHARGE:                               OPERATIVE REPORT   PREOPERATIVE DIAGNOSIS:  Infected left total knee, status post resection  arthroplasty.   POSTOPERATIVE DIAGNOSIS:  Infected left knee.   PROCEDURE:  Repeat irrigation and debridement of left knee, with  exchange of antibiotic spacer.   SURGEON:  Ollen Gross, M.D.   ASSISTANT:  [name], P.A.C.   ANESTHESIA:  General, with postoperative Marcaine pain pump.   BLOOD LOSS:  Minimal.   DRAIN:  Hemovac times one.   TOURNIQUET TIME:  69 minutes at 300 mmHg.   COMPLICATIONS:  None.  Condition stable to recovery room.   BRIEF CLINICAL NOTE:  Bradley Sheppard is a 70 year old male with a long,  complex history regards to his left knee.  Basically he underwent a  resection arthroplasty of his left knee back on November 12, 2008.  Clinically he has eradicated this infection, based on clinical  evaluation and lab work.  He presents now for reimplantation of left  total knee arthroplasty.   PROCEDURE IN DETAIL:  After successful initiation of general anesthetic,  a tourniquet was placed high on his left thigh and the left lower  extremity was prepped and draped in the usual sterile fashion.  The  extremity was wrapped in Esmarch and tourniquet inflated to 300 mmHg.  A  midline incision was made with a 10 blade through subcutaneous tissue to  the level of the extensor mechanism.  A fresh blade was used to make a  medial parapatellar arthrotomy.  There was a very small amount of fluid  in the joint, with the appearance of old hematoma.  There was absolutely  no sign of infection in the joint.  The tissue shows thickened scar, but  no  evidence of any purulent tissue.  The fluid was sent for stat Gram  stain, culture and sensitivity.  We also sent 3 synovial specimens, one  from the medial side joint, one lateral, and one posterior.  The spacer  was easily removed.  There was minimal bone loss.  I resected a lot of  scar from underneath the quad tendon.  I subperiosteally elevated the  tissue off the femur to recreate the medial and lateral gutters.  The  knee was very stiff, but I was able to sublux the patella laterally.  We  then debrided a lot of fibrous tissue down to normal-appearing bone.  I  used a starter reamer to recreate the femoral and tibial canal, and then  thoroughly irrigated both canals.  On the femoral side we reamed to 22  mm; the tibial side to 13 mm.   There was some bone loss centrally in the tibia.  I did ream with the  proximal reamer through a size 5 base plate, in order to  recreate the  proximal entry point to the canal.  I then placed the extramedullary  cutting guide and resected a couple of millimeters off of the tibia back  to normal looking bone.  I placed the sleeve, starting with a 29 sleeve  (which did not have good purchase); but a 37 sleeve had excellent  purchase and rotational stability.  We then did a trial size 5 MBT tray,  with a 13 x 30 stem extension and a 37 sleeve.  This had excellent fit  on the tibia.   The femur was then addressed.  The 22 reamer was placed to serve as  intramedullary cutting guide.  Then 5-degree left alignment guide was  placed, to resect a millimeter off the distal surface.  On the medial  side we achieved bone through the distal portion of the block, we had to  go to 4 mm to get bone on the lateral side, thus needing a 4 mm distal  lateral augment.  The size 5 AP block was then placed in the +2  position, to effectively raise the stem and lower the anterior flange of  the prosthesis down onto the anterior femur.  We based our peritomy  rectangular  flexion space at 90 degrees of flexion.  The anterior and  posterior cuts were made, with minimal bone resected.  A revision block  was then placed and the chamfer cuts were made, as well as the  intercondylar cut at [facility].   We placed a trial prosthesis on the tibial side.  Once again, a size 5  MBT revision tray with 13 x 30 stem extension and 37 sleeve; and on the  femoral side, a size 5 PC3 femur with a 22 x 75 stem extension and a +2  position; 5 degree left valgus and a 4 mm distal lateral augment.  Per  checking with the spacers, we did a 15-mm insert.  This allowed for full  extension with excellent varus-valgus, and the anterior-posterior  balance throughout the full range of motion.  Full extension was  achieved.   We then addressed the patella.  The residual thickness was about 18 mm.  I cut down to 14 mm.  I placed a 41 template through the lug holes;  placed the trial patella, and then I closed the tendon with towel clips  and it tracked normally.   At this point we received the results of the Gram stain.  It showed rare  WBCs and a rare, occasional gram-positive bacteria.  It was not present  in change or clusters.  I called the lab and spoke directly with them.  They said this was a rare bacteria, but they did not see any grouping in  the bacteria.  I requested a second person to review the slide, and that  person concurred with the first.  We also received the frozen section  report back, and the frozen section showed no acute inflammation in any  of the 3 specimens.   Given his clinical data, I felt that the Gram stain result is probably  indicative of  contaminant or incorrect read.  However, given his  history, I decided against implanting the prosthesis in this setting  today.  I decided to redo the spacer and wait until final cultures all  come back.  If the final culture comes back negative, then we will bring  him back later in the week and do the  reimplantation.   We thoroughly irrigated  the joint with 3 liters saline with pulsatile  lavage.  While doing this, the cement was mixed for the spacer, which  was 3 batches of gentamicin-impregnated cement.  Once it was ready for  molding. I molded out the spacer and then placed it into the joint.  This filled up the extension space well.  I irrigated some more and then  closed with a running #1 PDS over a Hemovac drain.  The subcutaneous was  closed with interrupted 2-0 Vicryl and the skin with staples.  Catheter  for the Marcaine pain pump was placed, and the pump was initiated.  The  patient  was then placed into a bulky sterile dressing, knee immobilizer,  awakened and transferred to recovery in stable condition.   Again, plan is to await final cultures, and if they come back negative  we will do the reimplantation later in the week.      Ollen Gross, M.D.  Electronically Signed     FA/MEDQ  D:  12/31/2008  T:  12/31/2008  Job:  [id number]

## 2010-09-16 NOTE — Consult Note (Signed)
NAME:  Bradley Sheppard, Bradley Sheppard              ACCOUNT NO.:  [id number]   MEDICAL RECORD NO.:  [id number]          PATIENT TYPE:  AMB   LOCATION:  DAY                           FACILITY:  [facility]   PHYSICIAN:  R. Roetta Sessions, M.D. DATE OF BIRTH:  [birth year]   DATE OF CONSULTATION:  05/23/2008  DATE OF DISCHARGE:                                 CONSULTATION   REASON FOR CONSULTATION:  Colonoscopy.   PHYSICIAN REQUESTING CONSULTATION:  Doreen Beam, MD.   HISTORY OF PRESENT ILLNESS:  Bradley Sheppard is a 70 year old gentleman who  presents today for consideration of colonoscopy.  He has had anemia  requiring transfusion on 2 separate occasions.  He received 2 units back  on May 18, 2008, and 2 units in December 2009.  The patient states  that he was doing well up until the time of his left knee replacement in  August 2009.  He states for 3-4 weeks, he was recovering nicely but ever  since that time he has had problems with knee pain and swelling.  He has  had fluid drawn off on 1 occasion, and was told that it was negative for  infection.  They have been contemplating taking the prosthesis out and  redoing his knee.  He has had anorexia and a 20-pound weight loss.  Back  in November, he was found to have anemia with hemoglobin 9.4, hematocrit  28.3, MCV 82.8, platelets 630,000.  His BUN was 19, creatinine 1.2,  total bilirubin 0.1, alkaline phosphatase 92, SGOT 21, SGPT 18, albumin  3, TSH 0.76.  Vitamin B12 level normal.  I do not have all the labs, but  apparently in December, his hemoglobin had dropped and he was given 2  units of blood, which brought his hemoglobin back up to 10, according to  the wife.  She states his hemoglobin dropped again prior to his last  transfusion in the 8 range.  On May 18, 2008, his hemoglobin was 9.9  and MCV 79.7.  Platelet count down to 401,000 at the time.  He saw Dr.  Despina Hick last week for a swollen knee and had another tap done.  He is  waiting on the  results and is going to follow up with him tomorrow.   The patient denies any melena or rectal bleeding.  He has not had any  Hemoccult stool.  Denies any abdominal pain.  He complains of loss of  appetite.  Denies nausea, vomiting, dysphagia, odynophagia, or  heartburn.   CURRENT MEDICATIONS:  1. Glucotrol XL 5 mg 2 in the morning and one in the evening.  2. Actos 45 mg daily.  3. Metoprolol 50 mg b.i.d.  4. Oxycodone 5/325 mg every 8 hours as needed.  5. Vytorin 10/20 mg daily.  6. Lorazepam 1 mg nightly.  7. Flexeril 10 mg 1-2 daily.  8. Altace 10 mg daily.  9. Ferrous sulfate 325 mg t.i.d.  10.Nitrostat 0.4 mg sublingual p.r.n.  11.Metformin 500 mg b.i.d.  12.Aspirin 81 mg daily.   ALLERGIES:  No known drug allergies.   PAST MEDICAL HISTORY:  1. Diabetes mellitus.  2. Hypertension.  3. Hypercholesterolemia.  4. Anxiety.  5. History of CAD, status post coronary artery stenting in 2003.  6. History of GI bleed due to peptic ulcer disease.  According to the      patient, it was secondary to NSAIDs and possible H. pylori.  She      describes him taking antibiotics.  7. History of nephrolithiasis.  8. Osteoarthritis, left knee.  9. 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  10.Status post appendectomy.   FAMILY HISTORY:  Negative for colorectal cancer.  Mother died with  aneurysm.  Father had stroke and blood clots.   SOCIAL HISTORY:  He is married with 2 children.  He is retired.  He  chews tobacco.  No alcohol use.   REVIEW OF SYSTEMS:  See HPI for GI.  CONSTITUTIONAL:  See HPI for weight  loss.  CARDIOPULMONARY:  He denies shortness of breath, palpitations,  chest pain, or cough.  GENITOURINARY:  Denies dysuria or hematuria.   PHYSICAL EXAMINATION:  VITAL SIGNS:  Weight 202, height 6 feet, temp  98.5, blood pressure 102/66.  GENERAL:  Pleasant, elderly Caucasian male in no acute distress.  He  appears fatigued.  SKIN:  Warm and dry.  No  jaundice.  HEENT:  Sclerae nonicteric.  Oropharyngeal mucosa moist and pink.  No  lesions, erythema, or exudate.  NECK:  No lymphadenopathy or thyromegaly.  CHEST:  Lungs are clear to auscultation.  CARDIAC:  Regular rate and rhythm.  Normal S1 and S2.  No murmurs, rubs,  or gallops.  ABDOMEN:  Positive bowel sounds.  Abdomen is soft.  Liver edges  palpable.  Six fingerbreadths below the right costal margin and  midclavicular line.  I did not appreciate the spleen.  Abdomen is  nontender.  No abdominal bruits or hernias.  LOWER EXTREMITIES:  Trace edema bilaterally.  Left knee is normal in  color, slightly edematous, and warm to touch.   IMPRESSION:  The patient is a 70 year old gentleman with anemia  requiring 4 units of packed red blood cells over the course of last  couple of months.  Notably, preoperatively in August his hemoglobin was  in the 11 range.  Postoperatively, it was 8.9.  He has had no evidence  of overt GI bleeding.  There is no significant stool to check for  Hemoccult today.  Secretions were negative.  Only GI complaint is  anorexia as well as weight loss.  He has never had a colonoscopy.  He  does have history of peptic ulcer disease with bleeding related to  NSAIDs, plus or minus H. pylori based on description.  Would be  concerned about the possibility of occult GI bleeding.  Other  possibilities include underlying malignancy.  His liver edge was easily  palpable on exam.  No evidence of splenomegaly.  He will likely need to  have a radiological evaluation at some point in the near future.   PLAN:  1. Colonoscopy and EGD with Dr. Jena Gauss.  I have discussed this case      with Dr. Jena Gauss and he is in agreement with this plan.  I have      discussed the risks, alternatives, and benefits with regards to,      but not limited to the risk of reaction to medication, bleeding,      infection, and perforation, and the patient is agreeable to      proceed.  2. Would  advise on PPI therapy.  3. Based on endoscopic evaluation, the patient may need to have CT to      further evaluate weight loss.  His liver edge is somewhat prominent      today, would also recommend minimal abdominal ultrasound.  We will      make further recommendations after procedures.  4. If he develops progressive weakness and shortness of breath, blood      in his stool, black stools, he will go to the emergency department      or notify our office immediately.      Tana Coast, P.AJonathon Bellows, M.D.  Electronically Signed    LL/MEDQ  D:  05/23/2008  T:  05/24/2008  Job:  [id number]

## 2010-09-16 NOTE — Op Note (Signed)
NAME:  Bradley Sheppard, Bradley Sheppard NO.:  [id number]   MEDICAL RECORD NO.:  [id number]          PATIENT TYPE:  INP   LOCATION:  1604                         [facility]ACILITY:  [facility]   PHYSICIAN:  Ollen Gross, M.D.    DATE OF BIRTH:  22-Jul-1940   DATE OF PROCEDURE:  12/26/2007  DATE OF DISCHARGE:                               OPERATIVE REPORT   PREOPERATIVE DIAGNOSIS:  Osteoarthritis left knee.   POSTOPERATIVE DIAGNOSIS:  Osteoarthritis left knee.   PROCEDURE:  Left total knee arthroplasty.   SURGEON:  Ollen Gross, MD.   ASSISTANT:  Arlyn Leak, PA-C.   ANESTHESIA:  Spinal with Duramorph.   ESTIMATED BLOOD LOSS:  Minimal.   DRAIN:  None.   TOURNIQUET TIME:  44 minutes at 300 mmHg.   COMPLICATIONS:  None.   CONDITION:  Stable to recovery.   BRIEF CLINICAL NOTE:  Bradley Sheppard is a 70 year old male with end-stage  arthritis of the left knee with progressively worsening pain and  dysfunction.  He has failed nonoperative management and presents now for  total knee arthroplasty.   PROCEDURE IN DETAIL:  After the successful administration of spinal  anesthetic, a tourniquet is placed high on his left thigh and left lower  extremity prepped and draped in the usual sterile fashion.  Extremities  wrapped in Esmarch, knee flexed, tourniquet inflated to 300 mmHg.  Midline incision is made with a 10-blade through the subcutaneous tissue  to the level of the extensor mechanism.  A medial parapatellar  arthrotomy was made.  Soft tissue over the proximal medial tibia  subperiosteally elevated through the joint line with a knife and into  the semimembranosus bursa with a Cobb elevator.  Soft tissue over the  proximal lateral tibia is elevated with attention being paid to avoid  the patellar tendon on tibial tubercle.  Patella was everted, knee  flexed 90 degrees and ACL and PCL removed.  Drill was used to create a  starting hole on the distal femur and the canal was thoroughly  irrigated.  The 5 degrees left valgus alignment guide is placed  referencing off the posterior condyles, rotation is marked and the block  pinned to remove 11 mm of the distal femur.  I took 11 because of a  preop flexion contracture.  A distal femoral resection was made with an  oscillating saw.  A sizing block is placed, size 5 is most appropriate.  Rotation is marked off the epicondylar axis.  A size 5 cutting block is  placed and the anterior, posterior and chamfer cuts made.   The tibia subluxed forward and the menisci are removed.  The  extramedullary tibial alignment guide is placed referencing proximally  at the medial aspect of the tibial tubercle and distally along the  second metatarsal axis and tibial crest.  A block is pinned to remove 2  mm off the more deficient medial side.  It was a pretty significant  medial defect.  Tibial resection is made with an oscillating saw.  A  size 5 is most appropriate tibial component and the proximal tibia is  prepared with  the modular drill and keel punch for a size 5.  Femoral  preparation is completed with the intercondylar cut.   A size 5 mobile bearing tibial trial, a size 5 posterior stabilized  femoral trial and a 12.5 mm posterior stabilized rotating platform  insert trial are placed.  With the 12.5 there was a tiny bit of varus-  valgus plane extension so we went to a 15 which allowed for full  extension with excellent varus-valgus anterior and posterior balance  throughout full range of motion.  The patella was then everted and  thickness measured to be 27 mm.  A freehand resection is made with an  oscillating saw.  The 41 template is placed, lug holes are drilled,  trial patella is placed and it tracks normally.  Osteophytes are removed  off the posterior femur with the trial in place.  All trials are removed  and the cut bone surfaces prepared with pulsatile lavage.  Cement is  mixed and once ready for implantation the size 5  mobile bearing tibial  tray, size 5 posterior stabilized femur and 41 patella are cemented into  place and then the patella was held with a clamp.  A trial 15-mm insert  is placed, knee held in full extension and all extruded cement removed.  When the cement was fully hardened, then the permanent 15 mm posterior  stabilized rotating platform insert is placed into the tibial tray.  Wounds copiously irrigated saline solution.  FloSeal was injected on the  posterior capsule, medial and lateral gutters and suprapatellar area.  A  moist sponge is placed and the tourniquet released, total time of 44  minutes.  The sponge is held for 2 minutes then removed.  Minimal  bleeding was encountered.  That which is encountered is stopped with  electrocautery.  The wound is again copiously irrigated saline solution  and extensor mechanism closed with interrupted #1 PDS.  Flexion against  gravity is 135 degrees.  Subcu is closed with interrupted #2-0 Vicryl  and subcuticular running #4-0 Monocryl.  The incisions is cleaned and  dried and Steri-Strips and a bulky sterile dressing applied.  He is then  placed into a knee immobilizer, awakened and transported to recovery in  stable condition.      Ollen Gross, M.D.  Electronically Signed     FA/MEDQ  D:  12/26/2007  T:  12/26/2007  Job:  [id number]

## 2010-09-16 NOTE — Op Note (Signed)
NAME:  Bradley Sheppard, Bradley Sheppard NO.:  [id number]   MEDICAL RECORD NO.:  [id number]          PATIENT TYPE:  INP   LOCATION:  1607                         FACILITY:  [facility]   PHYSICIAN:  Ollen Gross, M.D.    DATE OF BIRTH:  1940-11-04   DATE OF PROCEDURE:  11/12/2008  DATE OF DISCHARGE:                               OPERATIVE REPORT   PREOPERATIVE DIAGNOSIS:  Infected left total knee arthroplasty.   POSTOPERATIVE DIAGNOSES:  Infected left total knee arthroplasty.   PROCEDURE:  Left knee resection arthroplasty with antibiotic spacer  placement.   SURGEON:  Dr. Lequita Halt.   ASSISTANT:  Avel Peace, PA-C.   ANESTHESIA:  General.   ESTIMATED BLOOD LOSS:  Minimal.   DRAIN:  None.   TOURNIQUET TIME:  Sixty minutes at 300 mmHg.   COMPLICATIONS:  None.   CONDITION:  Stable to recovery.   CLINICAL NOTE:  Bradley Sheppard is a 70 year old male with a complex history  in regards to his left total knee.  A primary total knee done proximally  a year ago.  He did well initially and then started to develop pain and  swelling.  He had a I and D with antibiotic spacer placement.  He did  fairly well initially and then over the past several months has had  increased pain and swelling, consistent with recurrent infection.  He  presents now for resection arthroplasty.   PROCEDURE IN DETAIL:  After the successful administration of general  anesthetic, a tourniquet was placed high on the left thigh and the left  lower extremity prepped and draped in the usual sterile fashion.  The  extremity was wrapped in an Esmarch, knee flexed and tourniquet inflated  to 300 mmHg.  A midline incision was made with a 10 blade through  subcutaneous tissue to the level of the extensor mechanism.  A fresh  blade was used to make a medial parapatellar arthrotomy.  Minimal fluid  was encountered in the joint.  There was thick scar tissue and some  hypertrophic synovium present.  The scar tissue was  excised under the  medial and lateral gutters.  Superomedial, I incised into the scarring.  A pocket of pus came out.  This was sent for Gram stain.  After thorough  scar debridement was performed, then the soft tissue over the proximal  and medial tibia was subperiosteally elevated to the joint line with the  knife and into the semimembranosus bursa with a Cobb elevator.  The soft  tissue laterally was also elevated.  The scar laterally was removed.  I  was then able to flex the knee and remove the tibial polyethylene.  We  then used osteotomes to disrupt the interface between the femoral  component and bone.  It was disrupted and removed with minimal bone  loss.  The cement was also removed.  We then subluxed the tibia forward  and placed retractors.  The oscillating saw was used to disrupt the  interface between the tibia and the component.  The components then  easily removed.  Under the component there was a large cyst  full of  purulent looking material.  This was also sent for Gram stain, culture  and sensitivity.  We thoroughly curetted out the cyst and removed all  the cement from the tibia.  I then reamed out the tibial and femoral  canals and thoroughly irrigated both.  Soft tissue debridement was then  performed on the posterior synovium.  We then thoroughly irrigated the  wound with 3 liters of saline with pulsatile lavage.  I also removed the  patella component with the saw.  We then mixed three batches of cement  with 3 grams of vancomycin and 3.6 grams of tobramycin.  Once the cement  was ready for molding, then I molded it to the appropriate configuration  of the extension space.  After the cement was fully hardened and the  heat was completely out of the spacer, then I placed the spacer into the  knee.  I thoroughly irrigated again and then placed a Hemovac drain.  The arthrotomy was then closed with interrupted #1 PDS.  There was no  range of motion in the knee with the  spacer in.  It was held in full  extension and remained there.  I then released the tourniquet with a  total time of 60 minutes.  Minimal bleeding was encountered.  The subcu  was then closed with interrupted 2-0 Vicryl and the skin with staples.  The catheter for the Marcaine pain pump was placed and the pump  initiated.  The Hemovac drain was hooked to suction.  The incision was  then cleaned and dried and a bulky sterile dressing applied.  He was  placed into a knee immobilizer, awakened and transported to recovery in  stable condition.      Ollen Gross, M.D.  Electronically Signed     FA/MEDQ  D:  11/12/2008  T:  11/13/2008  Job:  [id number]

## 2010-09-16 NOTE — H&P (Signed)
NAME:  Bradley Sheppard, Bradley Sheppard NO.:  [id number]   MEDICAL RECORD NO.:  [id number]          PATIENT TYPE:  INP   LOCATION:  NA                           FACILITY:  [facility]   PHYSICIAN:  Ollen Gross, M.D.    DATE OF BIRTH:  1940/11/29   DATE OF ADMISSION:  11/12/2008  DATE OF DISCHARGE:                              HISTORY & PHYSICAL   DATE OF OFFICE VISIT HISTORY AND PHYSICAL:  November 08, 2008.   CHIEF COMPLAINT:  Left knee pain.   HISTORY OF PRESENT ILLNESS:  The patient is a 70 year old male well-  known to Dr. Ollen Gross having previously undergone knee replacement  back in August 2009.  He, unfortunately, had to undergo an I and D with  poly exchange back in February earlier this year due to an infection.  He has had a reaccumulation of fluid and continued pain and felt to be  chronically infected at this point.  It is felt he would best be served  by undergoing resection arthroplasty with placement of antibiotic  spacer, subsequently admitted to the hospital.   ALLERGIES:  No known drug allergies.   CURRENT MEDICATIONS:  Did not have list, will bring to the hospital.   PAST MEDICAL HISTORY:  1. Hypertension.  2. Multivessel coronary arterial disease.  3. Cardiac murmur.  4. Hypercholesterolemia.  5. Past history of gastric ulceration secondary to NSAIDs.  6. Diabetes.  7. History of renal calculi.   PAST SURGICAL HISTORY:  Left knee replacement and then an I and D left  knee with poly exchange, appendectomy.  He also had a cardiac  catheterization back in August 2003 with 2 stents.   FAMILY HISTORY:  Father with stroke and blood clots.  Mother with  aneurysm.   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  Past smoker,  chews tobacco now.  Two children.  No alcohol.   REVIEW OF SYSTEMS:  GENERAL:  No fevers, chills, night sweats.  NEURO:  No seizures, syncope, paralysis.  RESPIRATORY:  No shortness of breath,  productive cough or hemoptysis.   CARDIOVASCULAR:  No chest pain, angina  or orthopnea.  GI: A little bit of constipation.  No nausea, vomiting,  no diarrhea.  GU:  No dysuria, hematuria or discharge.  MUSCULOSKELETAL:  Joint pain with infected left knee.   PHYSICAL EXAMINATION:  VITAL SIGNS:  Pulse 60, respirations 14, blood  pressure 128/60.  GENERAL:  A 70 year old white male well-nourished, well-developed, in no  acute distress, alert and oriented, cooperative, accompanied by his  wife.  HEENT:  Normocephalic, atraumatic.  Pupils round, reactive.  EOMs  intact.  NECK:  Supple with a trace left bruit and also a right bruit noted.  CHEST:  Clear anterior and posterior chest walls.  No rhonchi, rales or  wheezing.  HEART:  Regular rhythm with a grade 2/6 systolic ejection murmur best  heard over aortic and pulmonic point.  ABDOMEN:  Soft, nontender.  Bowel sounds present.  RECTAL/BREASTS/GENITALIA:  Not done, not pertinent to present illness.  EXTREMITIES:  Left knee is positive for an infusion, warm to  touch,  range of motion 5-110.   IMPRESSION:  Infected left total knee arthroplasty.   PLAN:  The patient admitted to [facility].  Will undergo a  resection arthroplasty with placement of antibiotic spacer.  The surgery  is to be performed by Ollen Gross.  Patient has been seen preop by Dr.  Sherril Croon and felt to be stable for surgery.      [name], P.A.C.      Ollen Gross, M.D.  Electronically Signed    ALP/MEDQ  D:  11/11/2008  T:  11/11/2008  Job:  [id number]   cc:   Ollen Gross, M.D.  Fax: [phone]   [name], MD  [phone] [address]  [place], [place] [address]   Doreen Beam, MD  Fax: [phone]

## 2010-09-16 NOTE — Op Note (Signed)
NAME:  Bradley Sheppard, Bradley Sheppard NO.:  [id number]   MEDICAL RECORD NO.:  [id number]          PATIENT TYPE:  AMB   LOCATION:  DAY                           FACILITY:  [facility]   PHYSICIAN:  R. Roetta Sessions, M.D. DATE OF BIRTH:  October 21, 1940   DATE OF PROCEDURE:  06/01/2008  DATE OF DISCHARGE:                               OPERATIVE REPORT   PROCEDURE:  Esophagogastroduodenoscopy, diagnostic, followed by  ileocolonoscopy, snare polypectomy biopsy.   INDICATIONS FOR PROCEDURE:  A 70 year old gentleman with recent  anorexia, 20-pound weight-loss and recurrent anemia requiring multiple  units packed red blood cells.  However, there has been no evidence of GI  bleeding.  He has never had his lower GI tract evaluated.  He has a  distant history of peptic ulcer disease.  EGD and colonoscopy now being  done.  Risks and benefits of this approach were discussed with the  patient previously and again at the bedside.  Risks, benefits,  alternatives and limitations have been discussed, questions answered,  all parties agreeable.   PROCEDURE NOTE:  O2 saturation, blood pressure, pulse, respiration  monitored throughout the entire procedure.  Conscious sedation, Versed 7  mg IV, Demerol 125 mg IV in divided doses.  Cetacaine spray for topical  oropharyngeal anesthesia.  Instrument:  Pentax video chip system.   FINDINGS:  Esophagogastroduodenoscopic examination of tubular esophagus  revealed no mucosal abnormalities.  The EG junction was easily  traversed.  On entering the stomach, the gastric cavity was empty,  insufflated well with air.  Thorough examination of the gastric mucosa,  including retroflexion in the proximal stomach.  Esophagogastric  junction demonstrated only a small hiatal hernia.  Pylorus was patent,  easily traversed.  Examination of the bulb, second and third portion  revealed no abnormalities.   Therapeutic/diagnostic maneuvers performed:  None.   The patient tolerated  procedure well and was prepared for colonoscopy.   Digital rectal exam revealed no abnormalities.   FINDINGS:  The prep was suboptimal but adequate.  Colon:  Colonic mucosa  was surveyed from the rectosigmoid junction to the left transverse,  right colon to the appendiceal orifice, ileocecal valve and cecum.  These structures were well-seen and photographed for the record.  Terminal ileum was intubated to 10 cm from this level.  Scope was slowly  and cautiously withdrawn.  All previously-mentioned  mucosal surfaces  were again seen.  The colon was somewhat elongated and tortuous.  The  patient had scattered left-sided diverticula.  The patient had a single  diminutive polyp and a single pedunculated 8-mm polyp in the distal  sigmoid segments.  The diminutive polyp was cold biopsy-removed.  The  large polyp was removed with hot-snare technique, recovered through the  scope.  Remainder of the colonic mucosa and terminal ileal mucosa  appeared normal.  The scope was pulled down to the rectum where thorough  examination of the rectal mucosa, including retroflexed view of the anal  verge, demonstrated no abnormalities.  The patient tolerated the  procedure well and was reacted in endoscopy.   IMPRESSION:  1. Normal rectum.  2. Left-sided  diverticula.  3. Sigmoid polyps, resected as described above.  4. Remainder of colonic mucosa and terminal mucosa appeared normal.  5. The patient did have a long redundant colon.   Patient's findings are somewhat reassuring, but failed to provide an  adequate explanation as to cause of anorexia, weight loss and  transfusion requirements.   RECOMMENDATIONS:  1. From a GI perspective, they wish to go ahead and do abdominal and      pelvic CT scan to further evaluate his symptoms.  2. No aspirin or arthritis medications for 5 days.  3. Follow-up on pathology.  4. Further recommendations to follow.      Jonathon Bellows, M.D.  Electronically  Signed     RMR/MEDQ  D:  06/01/2008  T:  06/01/2008  Job:  [id number]   cc:   Doreen Beam, MD  Fax: [phone]

## 2010-09-19 NOTE — Cardiovascular Report (Signed)
   NAME:  Bradley Sheppard, Bradley Sheppard NO.:  [id number]   MEDICAL RECORD NO.:  [id number]                   PATIENT TYPE:  OIB   LOCATION:  6526                                 FACILITY:  [facility]   PHYSICIAN:  Everardo Beals. Juanda Chance, M.D. [facility]           DATE OF BIRTH:  1940-09-11   DATE OF PROCEDURE:  12/16/2001  DATE OF DISCHARGE:                              CARDIAC CATHETERIZATION   PROCEDURE PERFORMED:  Percutaneous coronary intervention.   CLINICAL HISTORY:  The patient is 70 years old and was referred by Dr.  Doyne Keel to Dr. Andee Lineman with accelerated angina. Dr. Andee Lineman arranged for him  to come down to [facility] and he was studied yesterday by Dr. Diona Browner,  who found a 90% lesion in the mid LAD, 60-70% lesion in the proximal  circumflex artery and 80% lesion in the proximal right coronary artery. We  intervened on the LAD yesterday and brought him back today for staged  intervention on the right coronary artery.   DESCRIPTION OF PROCEDURE:  The procedure was performed via the left femoral  artery using an arterial sheath and 6 French preformed coronary catheters.  A front wall arterial puncture was performed and Omnipaque contrast was  used.  The patient was given Angiomax bolus and infusion. We used a 6 [place] guiding catheter with side holes and a short BMW wire. We crossed the  lesion in the proximal right coronary artery with the wire without much  difficulty. We direct stented with a 3.0 x 13 mm Cypher stent deploying this  with one inflation of 14 atmospheres for 45 seconds.  Repeat diagnostic  studies were then performed through the guiding catheter. The patient  tolerated the procedure well and left the laboratory in satisfactory  condition.   RESULTS:  Initially the stenosis in the proximal right coronary artery was  estimated at 80%.  Following stenting, this improved to less than 10%.  There was no dissection seen.    CONCLUSION:  Successful  stenting of the proximal right coronary artery  stenosis with improvement in percent diameter narrowing from 80% to less  than 10%.   DISPOSITION:  The patient was returned to the postangioplasty unit for  further observation.                                                  Bruce Elvera Lennox Juanda Chance, M.D. [facility]    BRB/MEDQ  D:  12/16/2001  T:  12/19/2001  Job:  [id number]   cc:   Wende Crease, M.D.  [facility]   Learta Codding, M.D. [facility]   Cardiopulmonary Laboratory

## 2010-09-19 NOTE — Cardiovascular Report (Signed)
NAME:  Bradley Sheppard, Bradley Sheppard NO.:  [id number]   MEDICAL RECORD NO.:  [id number]                   PATIENT TYPE:  OIB   LOCATION:  6526                                 FACILITY:  [facility]   PHYSICIAN:  [name], M.D. [facility]        DATE OF BIRTH:  [birth year]   DATE OF PROCEDURE:  12/15/2001  DATE OF DISCHARGE:  12/17/2001                              CARDIAC CATHETERIZATION   PRIMARY CARE PHYSICIAN:  Wende Crease, M.D.   CARDIOLOGIST:  Learta Codding, M.D.   INDICATIONS:  The patient is a 70 year old with a history of dyslipidemia,  type 2 diabetes mellitus, and osteoarthritis, who presents with symptoms  consistent with unstable angina. He is referred for cardiac catheterization  to define the coronary anatomy and access for potential revascularization  options.   PROCEDURES PERFORMED:  1. Left heart catheterization.  2. Selective coronary angiography.  3. Left ventriculography.   ACCESS AND EQUIPMENT:  The area about the right femoral artery was  anesthetized with 1% lidocaine and a 6 French sheath was placed in the right  femoral artery via the modified Seldinger technique. Standard preformed 6  [place] and JR4 catheters were used for selective coronary angiography, a  6 French angled pigtail catheter was used for left heart catheterization and  left ventriculography.  All exchanges were made over a wire.  The patient  tolerated the procedure well without immediate complications.   HEMODYNAMICS:  1. Left ventricle 150/14 mmHg.  2. Aorta 150/75 mmHg.   ANGIOGRAPHIC FINDINGS:  1. Left main coronary artery is free of significant flow-limiting coronary     atherosclerosis.  2. Left anterior descending has a 30% proximal stenosis and a fairly focal     85% mid vessel stenosis. Coronary flow is TIMI-3. There is essentially     one large first diagonal branch.  3. The circumflex coronary artery has a proximal 50-60% stenosis. There is a  proximal obtuse marginal branch that is essentially ramus intermedius.  4. The right coronary artery is dominant with a 60-70% proximal stenosis as     well as 30% more mid to distal stenosis.   LEFT VENTRICULOGRAPHY:  Left ventriculography in the RAO projection reveals  an ejection fraction of 50-55% without focal wall motion abnormalities and  no significant mitral regurgitation.   DIAGNOSES:  1. Multivessel coronary artery disease, most significant in the left     anterior descending and right coronary arteries as outlined above.  2. Normal left ventricular ejection fraction of 50-55%.   RECOMMENDATIONS:  Discussed the films with Dr. Juanda Chance. Will plan  percutaneous intervention to treat the left anterior descending and most  likely the right coronary artery as well.  [name], M.D. [facility]    SGM/MEDQ  D:  12/15/2001  T:  12/19/2001  Job:  [id number]

## 2010-09-19 NOTE — Discharge Summary (Signed)
NAME:  Bradley Sheppard, Bradley Sheppard NO.:  [id number]   MEDICAL RECORD NO.:  [id number]          PATIENT TYPE:  INP   LOCATION:  1607                         FACILITY:  [facility]   PHYSICIAN:  Ollen Gross, M.D.    DATE OF BIRTH:  04-05-1941   DATE OF ADMISSION:  06/27/2008  DATE OF DISCHARGE:  06/30/2008                               DISCHARGE SUMMARY   ADMISSION DIAGNOSES:  1. Infected left total knee.  2. Hypertension.  3. Coronary artery disease.  4. Cardiac murmur.  5. Elevated cholesterol.  6. Gastric ulceration due to NSAID use.  7. Diabetes.  8. History of renal calculi.  9. History of anemia.   DISCHARGE DIAGNOSES:  1. Infected left total knee status post left knee arthrotomy,      irrigation and debridement, poly exchange.  2. Acute blood-loss anemia.  3. Status post transfusion without sequelae.  4. Hypertension.  5. Coronary artery disease.  6. Cardiac murmur.  7. Elevated cholesterol.  8. Gastric ulceration due to NSAID use.  9. Diabetes.  10.History of renal calculi.  11.History of anemia.   PROCEDURE:  June 27, 2008, left knee arthrotomy, irrigation and  debridement, poly exchange.  Surgeon Dr. Lequita Halt, assistance Avel Peace, P.A.-C.  Anesthesia general endotracheal.  Tourniquet time 29  minutes.   CONSULTS:  None.   BRIEF HISTORY:  Bradley Sheppard is a 70 year old male with left knee done in  August 2009 and originally did well but developed severe pain and  swelling.  Aspiration was negative for bacteria but had elevated white  count.  Plan for an I and D workup.   LABORATORY:  Preoperative CBC showed a low hemoglobin at 8.8 with of  hematocrit 27.1, white cell count 8.1, platelets 398.  Postoperative  hemoglobin down to 8, given 2 units of blood, post-transfusion  hemoglobin 9.6, last noted H and H 9.3 and 28.0.  Hemoccult blood was  negative.  PT/PTT preoperative 14.5 and 46, respectively, with INR 1.1.  Chem panel on admission:  Low  albumin of 2.9, slightly elevated glucose  of 123.  Serial BMETS were followed.  Electrolytes remained within  normal limits.  Vancomycin trough taken once on June 29, 2008,  showed a trough level of 10.2.  Preoperative UA negative.  Blood group  type AB negative.  Wound culture:  Smear showed no organisms.  Culture  was no growth.  Anaerobe.  Culture smear showed no organisms, no  anaerobes isolated.   HOSPITAL COURSE:  The patient admitted to [facility], taken to  the OR, underwent above-stated procedure, __________ patient tolerated  the procedure well, later transferred to the recovery room and the  orthopedic floor.  He was given 2 units of blood because of the low  hemoglobin which was noted on admission.  We performed guaiac of his  stools and added GI protective medications.  He had more of an acute on  chronic-type blood-loss anemia following his surgery.  He actually had  good output, though, and his pressure was stable.  We added a PICC line  because of the infected knee, felt  he would need antibiotics.  By day 2,  he was doing a little bit better.  Hemoglobin was back up.  We got  discharge planning involved for home antibiotics.  Blood pressure was  stable.  Incision looked good.  We pulled the drain on day 2 when the  dressing was changed.  Started getting up out of bed, walking with  therapy.  By day 3, he was doing well.  Pressure was stable.  Hemoglobin  was stable and we discharged home.   PLAN:  1. Patient was discharged home on June 30, 2008.  2. Discharge diagnoses:  Please see above.  3. Discharge medications:  Percocet, vancomycin, has cyclobenzaprine      at home, and I told him to take aspirin.   FOLLOW-UP:  On March 9.   ACTIVITY:  Weightbearing as tolerated, total knee protocol.   DISPOSITION:  Home.   CONDITION ON DISCHARGE:  Improved.      [name], P.A.C.      Ollen Gross, M.D.  Electronically Signed     ALP/MEDQ  D:  07/28/2008  T:  07/28/2008  Job:  [id number]

## 2010-09-19 NOTE — Cardiovascular Report (Signed)
NAME:  MALIKYE, REPPOND NO.:  [id number]   MEDICAL RECORD NO.:  [id number]                   PATIENT TYPE:  OIB   LOCATION:  2855                                 FACILITY:  [facility]   PHYSICIAN:  Everardo Beals. Juanda Chance, M.D. [facility]           DATE OF BIRTH:  1940-10-06   DATE OF PROCEDURE:  12/15/2001  DATE OF DISCHARGE:                              CARDIAC CATHETERIZATION   PROCEDURE PERFORMED:  Percutaneous coronary intervention.   CLINICAL HISTORY:  The patient is 70 years old and has no prior history of  known heart disease and presented with symptoms of unstable angina. He was  seen by Dr. Andee Lineman in [place] and transferred down here for evaluation.  Diagnostic study was performed by Dr. Diona Browner, who found a 90% stenosis in  the mid LAD and a 70% stenosis in the circumflex artery and an 80% stenosis  in the proximal right coronary artery. Our plan was to do the LAD today and  stage the right coronary artery for tomorrow.   DESCRIPTION OF PROCEDURE:  The procedure was performed via the right femoral  artery.  The patient was given Angiomax bolus and infusion and was given 300  mg of Plavix. We used a Q4, 7 [place] guiding catheter with side holes and a  short Patriot wire.  We crossed the lesion in the mid LAD with some  difficulty because there was marked tortuosity and the wire went into side  branches.  We pre-dilated with a 2.75 x 12 mm Quantum Maverick performing  one inflation of 4 atmospheres for 30 seconds. We then deployed at 2.5 x 13  mm Cypher stent deploying this with one inflation of 11 atmospheres for 30  seconds. Repeat diagnostic studies were then performed through the guiding  catheter. The patient tolerated the procedure well and left the laboratory  in satisfactory condition.   RESULTS:  Initially the stenosis in the mid LAD was estimated at 90% and was  located just after and just before sharp bends in the vessel. Following  stenting this  improved to 0%. There was no dissection seen.   CONCLUSIONS:  Successful stenting of the mid left anterior descending artery  lesion with a Cypher stent with improvement in percent diameter narrowing  from 90% to 0%.   DISPOSITION:  The patient was returned to the postangioplasty unit.  We will  plan intervention on the right coronary artery tomorrow.                                                     Bruce Elvera Lennox Juanda Chance, M.D. [facility]    BRB/MEDQ  D:  12/15/2001  T:  12/19/2001  Job:  [id number]   cc:   Wende Crease, M.D.  [facility]  Learta Codding, M.D. [facility]   [name], M.D. [facility]   Cardiopulmonary Laboratory

## 2010-09-19 NOTE — H&P (Signed)
NAME:  Bradley Sheppard, BLANK NO.:  [id number]   MEDICAL RECORD NO.:  [id number]          PATIENT TYPE:  INP   LOCATION:  NA                           FACILITY:  [facility]   PHYSICIAN:  Bradley Sheppard, M.D.    DATE OF BIRTH:  Sep 30, 1940   DATE OF ADMISSION:  12/31/2008  DATE OF DISCHARGE:                              HISTORY & PHYSICAL   CHIEF COMPLAINT:  Resection of left total knee secondary to infection.   HISTORY OF PRESENT ILLNESS:  Patient is a 70 year old male, well known  to Dr. Homero Fellers Sheppard.  He has previously undergone a left total knee and  had been seen by Dr. Lequita Sheppard and underwent and I and D in the past.  He  unfortunately continued to have recurrence of his infection and had a  resection done in July of this year.  He has finished his antibiotics  and now presents to have reimplantation of the prosthesis.   ALLERGIES:  NO KNOWN DRUG ALLERGIES.   CURRENT MEDICATIONS:  1. Metoprolol 50 daily.  2. Ramipril 10 mg daily.  3. Glipizide XL 5 mg 2 tabs in the morning, 1 tab at night.  4. Cyclobenzaprine 10 p.o. t.i.d. p.r.n.  5. Actos 45 mg daily.  6. Oxycodone 5 mg as needed for pain.  7. Ferrous sulfate 325 p.o. daily.  8. Vytorin 10/20 p.o. daily.  9. Metformin 500 mg twice a day.  10.Nitro 0.4 sublingual as directed.  11.Aspirin 81 mg daily.  12.Lorazepam 1 mg at night.  13.Prevacid 30 mg p.o. q.a.m.  14.Folic acid 1 mg p.o. q.a.m.   PAST MEDICAL HISTORY:  1. Hypertension.  2. Multivessel coronary arterial disease.  3. Cardiac murmur.  4. Hypercholesterolemia.  5. Past history of gastric ulceration secondary to NSAIDs.  6. Diabetes mellitus.  7. History of renal calculi.   PAST SURGICAL HISTORY:  1. Left total knee arthroplasty.  2. I and D of left total knee with poly exchange.  3. Appendectomy.  4. Cardiac catheterization, August 2003, with 2 stents.  5. Recent resection arthroplasty of left total knee.   FAMILY HISTORY:  Father history of  stroke, blood clots.  Mother with  aneurysm.   SOCIAL HISTORY:  Married.  Worked in Chief Strategy Officer.  Past smoker.  Chews tobacco.  Two children.  No alcohol.   REVIEW OF SYSTEMS:  GENERAL:  No fevers, chills, or night sweats.  Some  fatigue.  NEURO:  No seizure, syncope, or paralysis.  RESPIRATORY:  A  little bit of shortness breath on exertion but no shortness of breath at  rest.  No productive cough or hemoptysis.  CARDIOVASCULAR:  No chest  pain, angina, or orthopnea.  GI:  No nausea, vomiting, diarrhea, or  constipation.  GU:  No dysuria, hematuria, or discharge.  MUSCULOSKELETAL:  Knee pain.   PHYSICAL EXAM:  VITAL SIGNS:  Pulse 52.  Respirations 14.  Blood  pressure 138/60.  GENERAL:  A 70 year old white male, well nourished, well developed, no  acute distress.  He is alert, oriented, and cooperative.  Accompanied by  his wife.  HEENT:  Normocephalic,  atraumatic.  Pupils equal, round, and reactive.  EOMs intact.  NECK:  Supple.  CHEST:  Clear anterior posterior chest wall.  There were no rhonchi,  rales, or wheezing.  HEART:  Regular rhythm but a bradycardic rhythm.  Pulse 52.  Systolic  ejection murmur, grade 2/6, over the left sternal border.  ABDOMEN:  Soft, nontender.  Bowel sounds present.  RECTAL:  Not done, no pertinent to present illness.  BREASTS:  Not done, no pertinent to present illness.  GENITALIA:  Not done, no pertinent to present illness.  EXTREMITIES:  Left knee incision is well healed.  Motor function intact.  Move foot and toes well on exam.  Sensation is intact.   IMPRESSION:  Resection arthroplasty of left total knee secondary to  infection.   PLAN:  Patient admitted to [facility] to undergo a  reimplantation of the left total knee.  Surgery will be performed by Dr.  Ollen Sheppard.      Bradley Sheppard, P.A.C.      Bradley Sheppard, M.D.  Electronically Signed    ALP/MEDQ  D:  12/30/2008  T:  12/30/2008  Job:  [id number]    cc:   Bradley Sheppard, M.D.  Fax: [phone]   [name], MD  [phone] [address]  [place], [place] [address]   Doreen Beam, MD  Fax: [phone]

## 2010-09-19 NOTE — H&P (Signed)
NAME:  Bradley Sheppard, Bradley Sheppard NO.:  [id number]   MEDICAL RECORD NO.:  [id number]          PATIENT TYPE:  INP   LOCATION:  1604                         FACILITY:  [facility]   PHYSICIAN:  Ollen Gross, M.D.    DATE OF BIRTH:  1940/07/04   DATE OF ADMISSION:  12/26/2007  DATE OF DISCHARGE:  12/29/2007                              HISTORY & PHYSICAL   CHIEF COMPLAINT:  Left knee pain.   HISTORY OF PRESENT ILLNESS:  The patient is 70 year old male was seen in  second opinion for left knee pain has been ongoing for quite some time  now.  Is found have end-stage arthritis, progressive in nature.  Felt to  be a good candidate for surgery.  Risks, benefits discussed.  The  patient admitted to hospital.   ALLERGIES:  No known drug allergies.   CURRENT MEDICATIONS:  Metformin, Glucotrol, cyclobenzaprine, lorazepam,  metoprolol, Actos, hydrocodone, Vytorin, Altace, low-dose aspirin.   PAST MEDICAL HISTORY:  Hypertension, multivessel coronary arterial  disease, cardiac murmur, elevated cholesterol.  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   PAST SURGICAL HISTORY:  Appendix, cardiac catheterization with stenting  times 2, EGD for gastrointestinal bleed x2.   FAMILY HISTORY:  Father with stroke and blood clots.  Mother with  aneurysm.   SOCIAL HISTORY:  Married, textile employment, smokeless tobacco use.  No  alcohol.  Two children.   REVIEW OF SYSTEMS:  GENERAL:  No fevers, chills, night sweats.  NEURO:  No seizures, syncope, or paralysis.  RESPIRATORY:  No shortness breath,  productive cough or hemoptysis.  CARDIOVASCULAR:  Occasional  palpitations.  No chest pain, angina, orthopnea.  GI: No nausea,  vomiting or diarrhea, diarrhea, constipation.  GU: No dysuria, hematuria  discharge.  MUSCULOSKELETAL:  Left knee.   PHYSICAL EXAMINATION:  VITAL SIGNS:  Pulse 64, respirations 12, blood  pressure 118/54.  GENERAL:  70 year old  white male well-nourished, well-developed, tall  frame no acute distress, accompanied by his wife.  HEENT: Normocephalic, atraumatic.  Pupils are reactive.  Oropharynx  clear without dentition.  EOMs intact.  NECK:  Supple.  No bruits.  CHEST:  Clear.  HEART:  Regular rate and rhythm with a crescendo-decrescendo type murmur  grade 2/6 to 3/6 systolic ejection murmur noted.  ABDOMEN:  Soft, slightly round.  Bowel sounds present.  RECTAL, BREASTS, GENITALIA:  Not done, not pertinent to present illness.  EXTREMITIES:  Left knee slight varus no effusion.  Range of motion of 5-  150.  No instability.   IMPRESSION:  Osteoarthritis left knee.   PLAN:  The patient admitted to [facility] to undergo a left  total knee arthroplasty.  Surgery will be performed by Dr. Ollen Gross.      [name], P.A.C.      Ollen Gross, M.D.  Electronically Signed    ALP/MEDQ  D:  01/22/2008  T:  01/24/2008  Job:  [id number]   cc:   Doreen Beam, MD  Fax: [phone]   [name], MD  [phone]  [place], [place]  [address]   Ollen Gross, M.D.  Fax: [phone]
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
## 2010-09-19 NOTE — Discharge Summary (Signed)
NAME:  KALUB, MORILLO NO.:  [id number]   MEDICAL RECORD NO.:  [id number]          PATIENT TYPE:  INP   LOCATION:  1604                         FACILITY:  [facility]   PHYSICIAN:  Ollen Gross, M.D.    DATE OF BIRTH:  1941/01/29   DATE OF ADMISSION:  12/26/2007  DATE OF DISCHARGE:  12/29/2007                               DISCHARGE SUMMARY   ADMISSION DIAGNOSES:  1. Osteoarthritis of the left knee.  2. Hypertension.  3. Hypercholesterolemia.  4. Coronary arterial disease status post coronary stenting.  5. History of gastrointestinal ulcerations.  6. History of gastrointestinal bleed secondary to nonsteroidal      antiinflammatory drugs.  7. Diabetes mellitus.  8. History of renal calculi.  9. History of anemia.   DISCHARGE DIAGNOSES:  1. Osteoarthritis of the left knee status post left total knee      replacement arthroplasty.  2. Mild postoperative blood loss anemia.  3. Mild postoperative hyponatremia, improved.  4. Mild hyperkalemia, improved.  5. Hypertension.  6. Hypercholesterolemia.  7. Coronary arterial disease status post coronary stenting.  8. History of gastrointestinal ulcerations.  9. History of gastrointestinal bleed secondary to nonsteroidal      antiinflammatory drugs.  10.Diabetes mellitus.  11.History of renal calculi.  12.History of anemia.   PROCEDURE:  December 26, 2007, left total knee.   SURGEON:  Ollen Gross, M.D.   ASSISTANT:  Jamelle Rushing, P.A.   ANESTHESIA:  Spinal with Duramorph.   CONSULTATIONS:  None.   BRIEF HISTORY:  Mr. Cerone is a 70 year old male with end-stage  arthritis of the left knee, progressive worsening pain and dysfunction,  failed medical management, now presents for total knee arthroplasty.   LABORATORY DATA:  Preop CBC showed hemoglobin 11.1, hematocrit 33.6,  white cell count 6.5, platelets 223, postop hemoglobin 10.1 and then  99.2, last H&H 8.9 and 26.2.  PT/PTT preop 12.8 and 34 respectively.  INR 1.0.  Serial pro-times followed.  PT/INR 30.5 and 2.7.  Chem panel  on admission all within normal limits.  Serial BMETs were followed.  Sodium did drop from 143 down to 132, back up to 137, potassium went up  from 4.1 to 5.2, back down to 5.1, glucose went up from 90 to 250, back  down to 139, creatinine did elevate a little bit from 1.1 to 1.6, back  down to 1.1.  Preop UA was negative.  Blood group type AB negative.   DIAGNOSTICS:  1. Two-view chest December 20, 2007:  No evidence of acute cardiac or      pulmonary process. 2.  EKG dated December 09, 2007:  Sinus rhythm with      marked sinus arrhythmia, left axis deviation, minimal voltage      criteria for LVH, confirmed.   HOSPITAL COURSE:  The patient was admitted to [facility] and  tolerated the procedure well.  Later transferred to the recovery room on  the orthopedic floor.  Started on PCA and p.o. analgesic pain control  following surgery.  Doing pretty well on the evening of surgery.  On the  morning of  day 1, did have a little bit of bloody drainage on his  dressing, so we changed that and the incision looked good.  No active  drainage.  Potassium was up a little bit to 5.2, questionable hemolysis  happened, so we rechecked the BMET.  Potassium came back down.  His  glucose was elevated with known diabetic, so that was monitored.  He had  excellent output.  Started getting up out of bed with therapy by day 2.  Serial glucose was down.  Potassium was back down to a normal level.  Did have a drop in his sodium down to 132 which was felt to be due to  delusional component.  Discontinued the fluids.  Rechecked a BMET.  Iron  was added because the hemoglobin was down to 9.2. Despite his anemia, he  was actually doing pretty well with therapy walking about 60 feet.  Incision was healing well.  Continued to progress well.  By day 3, he  was walking well, meeting goals, tolerating his meds and was discharged  home.    DISPOSITION:  The patient was discharged home by December 29, 2007.   DISCHARGE MEDICATIONS:  1. Coumadin.  2. Nu-Iron.  3. Percocet.  4. Flexeril.   FOLLOW UP:  Follow up in 2 weeks.   ACTIVITY:  Weightbearing as tolerated, total knee protocol left lower  extremity.  Home health PT and home health nursing.   DIET:  Diabetic diet, heart-healthy diet.   CONDITION ON DISCHARGE:  Improving.      [name], P.A.C.      Ollen Gross, M.D.  Electronically Signed    ALP/MEDQ  D:  01/26/2008  T:  01/27/2008  Job:  [id number]   cc:   Ollen Gross, M.D.  Fax: [phone]   [name], MD  [phone]  [place], [place] [address]   Doreen Beam, MD  Fax: [phone]
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
## 2010-11-07 ENCOUNTER — Encounter: Payer: Self-pay | Admitting: Cardiology

## 2012-06-24 ENCOUNTER — Encounter: Payer: Self-pay | Admitting: *Deleted

## 2012-06-24 ENCOUNTER — Telehealth: Payer: Self-pay | Admitting: Cardiology

## 2012-06-24 ENCOUNTER — Ambulatory Visit (INDEPENDENT_AMBULATORY_CARE_PROVIDER_SITE_OTHER): Payer: Medicare Other | Admitting: Cardiology

## 2012-06-24 ENCOUNTER — Other Ambulatory Visit: Payer: Self-pay | Admitting: *Deleted

## 2012-06-24 ENCOUNTER — Encounter: Payer: Self-pay | Admitting: Cardiology

## 2012-06-24 VITALS — BP 160/84 | HR 62 | Ht 73.0 in | Wt 234.0 lb

## 2012-06-24 DIAGNOSIS — I359 Nonrheumatic aortic valve disorder, unspecified: Secondary | ICD-10-CM

## 2012-06-24 DIAGNOSIS — I10 Essential (primary) hypertension: Secondary | ICD-10-CM

## 2012-06-24 DIAGNOSIS — I2510 Atherosclerotic heart disease of native coronary artery without angina pectoris: Secondary | ICD-10-CM

## 2012-06-24 DIAGNOSIS — I200 Unstable angina: Secondary | ICD-10-CM

## 2012-06-24 DIAGNOSIS — I350 Nonrheumatic aortic (valve) stenosis: Secondary | ICD-10-CM

## 2012-06-24 DIAGNOSIS — E782 Mixed hyperlipidemia: Secondary | ICD-10-CM

## 2012-06-24 NOTE — Telephone Encounter (Signed)
Lexiscan Myoview Weight 234  Diagnosis: 414.01, 424.1, 411.1  Thursday, June 30, 2012 @ [facility]

## 2012-06-24 NOTE — Assessment & Plan Note (Signed)
On statin therapy, followed by Dr. Sherril Croon.

## 2012-06-24 NOTE — Assessment & Plan Note (Signed)
Followed by Dr. Sherril Croon. He reports compliance with his medications. Blood pressure is elevated today.

## 2012-06-24 NOTE — Assessment & Plan Note (Signed)
Very mild by recent echocardiogram.

## 2012-06-24 NOTE — Patient Instructions (Addendum)

## 2012-06-24 NOTE — Assessment & Plan Note (Signed)
Recent episodes of angina in January, ECG abnormal but relatively nonspecific. LV function remains normal by recent followup echocardiogram. He has not had any ischemic evaluation since 2008. Nitroglycerin is now refilled and fresh. He has been hesitant to use Imdur. Plan is to proceed with a Lexiscan Cardiolite to better define the presence and distribution of ischemia, then decide whether we continue observation on medical therapy or pursue further invasive evaluation.

## 2012-06-24 NOTE — Progress Notes (Signed)
Clinical Summary [name] is a 72 y.o.male presenting for a followup visit. He has not been seen in the clinic since December 2010. He is here with his wife today. He is referred back by Dr. Sherril Croon, had 3 episodes of angina in January. He did not use nitroglycerin, states he has had no recurrence since then. Somewhat unusual by his report, in that he typically has done well without any angina symptoms at baseline.   Recent echocardiogram at [facility] in January reports LVEF 55-60%, mild left atrial enlargement, thickened mitral valve with mild mitral regurgitation, minimal aortic stenosis, trace tricuspid regurgitation.  Adenosine Cardiolite from August 2008 revealed no significant ST segment change, normal perfusion with LVEF 52%. He has not had any followup ischemic testing since then.  ECG today shows sinus rhythm with left anterior fascicular block, poor anterior R wave progression, rule out anterior infarct pattern.  No Known Allergies  Current Outpatient Prescriptions  Medication Sig Dispense Refill  . aspirin 81 MG tablet Take 81 mg by mouth daily.        Marland Kitchen glipiZIDE (GLUCOTROL XL) 5 MG 24 hr tablet Take 5 mg by mouth daily. 2 tabs every morning; and 1 tab at bedtime       . isosorbide mononitrate (IMDUR) 30 MG 24 hr tablet Take 30 mg by mouth as needed.       Marland Kitchen LORazepam (ATIVAN) 1 MG tablet Take 1 mg by mouth at bedtime.        . metFORMIN (GLUMETZA) 500 MG (MOD) 24 hr tablet Take 500 mg by mouth 2 (two) times daily.        . methocarbamol (ROBAXIN) 500 MG tablet Take 500 mg by mouth 2 (two) times daily as needed.      . metoprolol (LOPRESSOR) 50 MG tablet Take 50 mg by mouth 2 (two) times daily.        . nitroGLYCERIN (NITROSTAT) 0.4 MG SL tablet Place 0.4 mg under the tongue every 5 (five) minutes as needed.        . pioglitazone (ACTOS) 45 MG tablet Take 45 mg by mouth daily.        . ramipril (ALTACE) 10 MG tablet Take 10 mg by mouth 2 (two) times daily.       .  simvastatin (ZOCOR) 40 MG tablet Take 40 mg by mouth every evening.       No current facility-administered medications for this visit.    Past Medical History  Diagnosis Date  . Coronary atherosclerosis of native coronary artery     Multvessel, DES to LAD and RCA 8/03  . DM2 (diabetes mellitus, type 2)   . GERD (gastroesophageal reflux disease)   . HLD (hyperlipidemia)   . Asthma   . Arthritis   . Gastritis     Related to NSAIDs  . Nephrolithiasis     Social History Mr. Oyster reports that he has quit smoking. His smoking use included Cigarettes. He smoked 0.00 packs per day. His smokeless tobacco use includes Chew. Mr. Gallentine reports that he does not drink alcohol.  Review of Systems No palpitations. Has chronic discomfort in his left knee and limited range of motion. Denies any falls, dizziness, syncope. Otherwise negative.  Physical Examination Filed Vitals:   06/24/12 1357  BP: 160/84  Pulse: 62   Filed Weights   06/24/12 1357  Weight: 234 lb (106.142 kg)   No acute distress. HEENT: Conjunctiva and lids normal, oropharynx clear. Neck: Supple, no elevated  JVP or carotid bruits, no thyromegaly. Lungs: Clear to auscultation, nonlabored breathing at rest. Cardiac: Regular rate and rhythm, no S3, 2/6 systolic murmur, no pericardial rub. Abdomen: Soft, nontender, bowel sounds present. Extremities: Trace edema, distal pulses 2+. Limited range of motion left knee. Skin: Warm and dry. Musculoskeletal: No kyphosis. Neuropsychiatric: Alert and oriented x3, affect grossly appropriate.   Problem List and Plan   CORONARY ATHEROSCLEROSIS NATIVE CORONARY ARTERY Recent episodes of angina in January, ECG abnormal but relatively nonspecific. LV function remains normal by recent followup echocardiogram. He has not had any ischemic evaluation since 2008. Nitroglycerin is now refilled and fresh. He has been hesitant to use Imdur. Plan is to proceed with a Lexiscan Cardiolite to  better define the presence and distribution of ischemia, then decide whether we continue observation on medical therapy or pursue further invasive evaluation.  ESSENTIAL HYPERTENSION, BENIGN Followed by Dr. Sherril Croon. He reports compliance with his medications. Blood pressure is elevated today.  MIXED HYPERLIPIDEMIA On statin therapy, followed by Dr. Sherril Croon.  Aortic stenosis Very mild by recent echocardiogram.    [name], M.D., F.A.C.C.

## 2012-06-27 NOTE — Telephone Encounter (Signed)
Auth # [id number] exp 07/26/12

## 2012-06-30 DIAGNOSIS — I2510 Atherosclerotic heart disease of native coronary artery without angina pectoris: Secondary | ICD-10-CM

## 2012-07-11 ENCOUNTER — Telehealth: Payer: Self-pay | Admitting: Cardiology

## 2012-07-11 NOTE — Telephone Encounter (Signed)
Copy forwarded to PMD - Vyas.

## 2012-07-11 NOTE — Telephone Encounter (Signed)
Patient called and asking for results of stress test, ok to leave on answering machine.

## 2012-07-11 NOTE — Telephone Encounter (Signed)
Notes Recorded by Lesle Chris, LPN on 1/61/0960 at 12:02 PM Patient and wife notified. ------  Notes Recorded by [name], MD on 07/06/2012 at 8:30 AM Reviewed. Study is consistent with history of CAD, scar within the inferolateral wall with mild to moderate peri-infarct ischemia and normal LVEF 57%. Expect that he may well have intermittent angina based on this. If he remains stable without progressive symptoms, we can continue medical therapy for now. If his angina is worsening, cardiac catheterization can be discussed.

## 2012-07-11 NOTE — Telephone Encounter (Signed)
Patient also requesting that stress test result be sent to Dr. Sherril Croon his PCP.

## 2015-04-23 ENCOUNTER — Telehealth: Payer: Self-pay | Admitting: Internal Medicine

## 2015-04-23 NOTE — Telephone Encounter (Signed)
RECALL FOR TCS °

## 2015-04-23 NOTE — Telephone Encounter (Signed)
Letter in mail

## 2015-06-18 DIAGNOSIS — E1165 Type 2 diabetes mellitus with hyperglycemia: Secondary | ICD-10-CM | POA: Diagnosis not present

## 2015-06-18 DIAGNOSIS — F419 Anxiety disorder, unspecified: Secondary | ICD-10-CM | POA: Diagnosis not present

## 2015-06-18 DIAGNOSIS — I10 Essential (primary) hypertension: Secondary | ICD-10-CM | POA: Diagnosis not present

## 2015-07-11 DIAGNOSIS — M179 Osteoarthritis of knee, unspecified: Secondary | ICD-10-CM | POA: Diagnosis not present

## 2015-07-11 DIAGNOSIS — E1165 Type 2 diabetes mellitus with hyperglycemia: Secondary | ICD-10-CM | POA: Diagnosis not present

## 2015-09-27 DIAGNOSIS — M159 Polyosteoarthritis, unspecified: Secondary | ICD-10-CM | POA: Diagnosis not present

## 2015-09-27 DIAGNOSIS — E119 Type 2 diabetes mellitus without complications: Secondary | ICD-10-CM | POA: Diagnosis not present

## 2015-09-27 DIAGNOSIS — I10 Essential (primary) hypertension: Secondary | ICD-10-CM | POA: Diagnosis not present

## 2015-09-29 DIAGNOSIS — Z6833 Body mass index (BMI) 33.0-33.9, adult: Secondary | ICD-10-CM | POA: Diagnosis not present

## 2015-09-29 DIAGNOSIS — Z299 Encounter for prophylactic measures, unspecified: Secondary | ICD-10-CM | POA: Diagnosis not present

## 2015-09-29 DIAGNOSIS — Z1211 Encounter for screening for malignant neoplasm of colon: Secondary | ICD-10-CM | POA: Diagnosis not present

## 2015-09-29 DIAGNOSIS — Z1389 Encounter for screening for other disorder: Secondary | ICD-10-CM | POA: Diagnosis not present

## 2015-09-29 DIAGNOSIS — Z7189 Other specified counseling: Secondary | ICD-10-CM | POA: Diagnosis not present

## 2015-09-29 DIAGNOSIS — Z0000 Encounter for general adult medical examination without abnormal findings: Secondary | ICD-10-CM | POA: Diagnosis not present

## 2015-10-14 DIAGNOSIS — Z789 Other specified health status: Secondary | ICD-10-CM | POA: Diagnosis not present

## 2015-10-14 DIAGNOSIS — E1165 Type 2 diabetes mellitus with hyperglycemia: Secondary | ICD-10-CM | POA: Diagnosis not present

## 2015-10-14 DIAGNOSIS — I10 Essential (primary) hypertension: Secondary | ICD-10-CM | POA: Diagnosis not present

## 2015-10-14 DIAGNOSIS — M1710 Unilateral primary osteoarthritis, unspecified knee: Secondary | ICD-10-CM | POA: Diagnosis not present

## 2015-10-30 DIAGNOSIS — Z7189 Other specified counseling: Secondary | ICD-10-CM | POA: Diagnosis not present

## 2015-10-30 DIAGNOSIS — Z0000 Encounter for general adult medical examination without abnormal findings: Secondary | ICD-10-CM | POA: Diagnosis not present

## 2015-10-30 DIAGNOSIS — Z79899 Other long term (current) drug therapy: Secondary | ICD-10-CM | POA: Diagnosis not present

## 2015-10-30 DIAGNOSIS — E7800 Pure hypercholesterolemia, unspecified: Secondary | ICD-10-CM | POA: Diagnosis not present

## 2015-10-30 DIAGNOSIS — Z1389 Encounter for screening for other disorder: Secondary | ICD-10-CM | POA: Diagnosis not present

## 2015-10-30 DIAGNOSIS — R5383 Other fatigue: Secondary | ICD-10-CM | POA: Diagnosis not present

## 2015-10-30 DIAGNOSIS — Z299 Encounter for prophylactic measures, unspecified: Secondary | ICD-10-CM | POA: Diagnosis not present

## 2015-10-30 DIAGNOSIS — Z125 Encounter for screening for malignant neoplasm of prostate: Secondary | ICD-10-CM | POA: Diagnosis not present

## 2015-10-30 DIAGNOSIS — Z1211 Encounter for screening for malignant neoplasm of colon: Secondary | ICD-10-CM | POA: Diagnosis not present

## 2015-10-30 DIAGNOSIS — Z6833 Body mass index (BMI) 33.0-33.9, adult: Secondary | ICD-10-CM | POA: Diagnosis not present

## 2015-11-22 DIAGNOSIS — I10 Essential (primary) hypertension: Secondary | ICD-10-CM | POA: Diagnosis not present

## 2015-11-22 DIAGNOSIS — E119 Type 2 diabetes mellitus without complications: Secondary | ICD-10-CM | POA: Diagnosis not present

## 2015-11-22 DIAGNOSIS — M159 Polyosteoarthritis, unspecified: Secondary | ICD-10-CM | POA: Diagnosis not present

## 2015-12-13 DIAGNOSIS — M79606 Pain in leg, unspecified: Secondary | ICD-10-CM | POA: Diagnosis not present

## 2015-12-13 DIAGNOSIS — E1165 Type 2 diabetes mellitus with hyperglycemia: Secondary | ICD-10-CM | POA: Diagnosis not present

## 2015-12-13 DIAGNOSIS — F419 Anxiety disorder, unspecified: Secondary | ICD-10-CM | POA: Diagnosis not present

## 2015-12-13 DIAGNOSIS — E118 Type 2 diabetes mellitus with unspecified complications: Secondary | ICD-10-CM | POA: Diagnosis not present

## 2016-01-17 DIAGNOSIS — R252 Cramp and spasm: Secondary | ICD-10-CM | POA: Diagnosis not present

## 2016-01-17 DIAGNOSIS — S80811A Abrasion, right lower leg, initial encounter: Secondary | ICD-10-CM | POA: Diagnosis not present

## 2016-01-17 DIAGNOSIS — S90822A Blister (nonthermal), left foot, initial encounter: Secondary | ICD-10-CM | POA: Diagnosis not present

## 2016-01-17 DIAGNOSIS — D649 Anemia, unspecified: Secondary | ICD-10-CM | POA: Diagnosis not present

## 2016-01-21 DIAGNOSIS — D509 Iron deficiency anemia, unspecified: Secondary | ICD-10-CM | POA: Diagnosis not present

## 2016-01-21 DIAGNOSIS — E1165 Type 2 diabetes mellitus with hyperglycemia: Secondary | ICD-10-CM | POA: Diagnosis not present

## 2016-01-21 DIAGNOSIS — D649 Anemia, unspecified: Secondary | ICD-10-CM | POA: Diagnosis not present

## 2016-02-20 ENCOUNTER — Ambulatory Visit (INDEPENDENT_AMBULATORY_CARE_PROVIDER_SITE_OTHER): Payer: PPO | Admitting: Podiatry

## 2016-02-20 ENCOUNTER — Encounter: Payer: Self-pay | Admitting: Podiatry

## 2016-02-20 VITALS — Resp 16 | Ht 73.0 in | Wt 240.0 lb

## 2016-02-20 DIAGNOSIS — L84 Corns and callosities: Secondary | ICD-10-CM | POA: Diagnosis not present

## 2016-02-20 DIAGNOSIS — L600 Ingrowing nail: Secondary | ICD-10-CM | POA: Diagnosis not present

## 2016-02-20 NOTE — Progress Notes (Signed)
   Subjective:    Patient ID: KHA ORBIN, male    DOB: 09-29-1940, 75 y.o.   MRN: [id number]  HPI Chief Complaint  Patient presents with  . Spot on skin    Left foot-medial (near ach); pt stated, "Dr thinks could be a blister; needs to be checked"; pt diabetic type 2; sugar=124 this am; A1C=7.0      Review of Systems  Constitutional: Positive for activity change and fatigue.  HENT: Positive for hearing loss.   Musculoskeletal: Positive for arthralgias, gait problem and myalgias.  Hematological: Bruises/bleeds easily.  All other systems reviewed and are negative.      Objective:   Physical Exam        Assessment & Plan:

## 2016-02-24 NOTE — Progress Notes (Signed)
Subjective:     Patient ID: Bradley Sheppard, male   DOB: [birth year], 75 y.o.   MRN: [id number]  HPI patient presents with a small lesion plantar aspect of the left arch that he is concerned could be something that is a problem because of his health history   Review of Systems  All other systems reviewed and are negative.      Objective:   Physical Exam  Constitutional: He is oriented to person, place, and time.  Cardiovascular: Intact distal pulses.   Musculoskeletal: Normal range of motion.  Neurological: He is oriented to person, place, and time.  Skin: Skin is warm and dry.  Nursing note and vitals reviewed.  neurovascular status found to be intact with muscle strength within normal limits for his age with moderate range of motion loss. There is a small lesion plantar aspect left arch localized in nature with no proximal edema erythema or drainage noted and it measures approximate 4 mm x 4 mm     Assessment:     Small lesion left arch localized in nature    Plan:     H&P x-ray reviewed condition discussed debridement accomplished was found to be surface with no indications of drainage underneath it. I did applied Band-Aid instructions on watching it daily if any changes were to occur to reappoint immediately  X-ray report indicated mild calcification but no indications of abscess formation with moderate arthritis noted

## 2016-03-13 DIAGNOSIS — Z299 Encounter for prophylactic measures, unspecified: Secondary | ICD-10-CM | POA: Diagnosis not present

## 2016-03-13 DIAGNOSIS — D509 Iron deficiency anemia, unspecified: Secondary | ICD-10-CM | POA: Diagnosis not present

## 2016-03-13 DIAGNOSIS — Z6832 Body mass index (BMI) 32.0-32.9, adult: Secondary | ICD-10-CM | POA: Diagnosis not present

## 2016-03-13 DIAGNOSIS — Z713 Dietary counseling and surveillance: Secondary | ICD-10-CM | POA: Diagnosis not present

## 2016-03-13 DIAGNOSIS — M25569 Pain in unspecified knee: Secondary | ICD-10-CM | POA: Diagnosis not present

## 2016-04-06 DIAGNOSIS — M159 Polyosteoarthritis, unspecified: Secondary | ICD-10-CM | POA: Diagnosis not present

## 2016-04-06 DIAGNOSIS — I10 Essential (primary) hypertension: Secondary | ICD-10-CM | POA: Diagnosis not present

## 2016-04-06 DIAGNOSIS — E119 Type 2 diabetes mellitus without complications: Secondary | ICD-10-CM | POA: Diagnosis not present

## 2016-04-30 DIAGNOSIS — Z299 Encounter for prophylactic measures, unspecified: Secondary | ICD-10-CM | POA: Diagnosis not present

## 2016-04-30 DIAGNOSIS — E1165 Type 2 diabetes mellitus with hyperglycemia: Secondary | ICD-10-CM | POA: Diagnosis not present

## 2016-04-30 DIAGNOSIS — D649 Anemia, unspecified: Secondary | ICD-10-CM | POA: Diagnosis not present

## 2016-04-30 DIAGNOSIS — Z6832 Body mass index (BMI) 32.0-32.9, adult: Secondary | ICD-10-CM | POA: Diagnosis not present

## 2016-04-30 DIAGNOSIS — I10 Essential (primary) hypertension: Secondary | ICD-10-CM | POA: Diagnosis not present

## 2016-05-08 DIAGNOSIS — Z6834 Body mass index (BMI) 34.0-34.9, adult: Secondary | ICD-10-CM | POA: Diagnosis not present

## 2016-05-08 DIAGNOSIS — Z299 Encounter for prophylactic measures, unspecified: Secondary | ICD-10-CM | POA: Diagnosis not present

## 2016-05-08 DIAGNOSIS — Z713 Dietary counseling and surveillance: Secondary | ICD-10-CM | POA: Diagnosis not present

## 2016-05-08 DIAGNOSIS — J069 Acute upper respiratory infection, unspecified: Secondary | ICD-10-CM | POA: Diagnosis not present

## 2016-05-08 DIAGNOSIS — Z789 Other specified health status: Secondary | ICD-10-CM | POA: Diagnosis not present

## 2016-05-11 DIAGNOSIS — E119 Type 2 diabetes mellitus without complications: Secondary | ICD-10-CM | POA: Diagnosis not present

## 2016-05-11 DIAGNOSIS — M159 Polyosteoarthritis, unspecified: Secondary | ICD-10-CM | POA: Diagnosis not present

## 2016-05-11 DIAGNOSIS — I10 Essential (primary) hypertension: Secondary | ICD-10-CM | POA: Diagnosis not present

## 2016-06-12 DIAGNOSIS — F419 Anxiety disorder, unspecified: Secondary | ICD-10-CM | POA: Diagnosis not present

## 2016-06-12 DIAGNOSIS — Z299 Encounter for prophylactic measures, unspecified: Secondary | ICD-10-CM | POA: Diagnosis not present

## 2016-06-12 DIAGNOSIS — D509 Iron deficiency anemia, unspecified: Secondary | ICD-10-CM | POA: Diagnosis not present

## 2016-06-12 DIAGNOSIS — E1165 Type 2 diabetes mellitus with hyperglycemia: Secondary | ICD-10-CM | POA: Diagnosis not present

## 2016-06-12 DIAGNOSIS — Z713 Dietary counseling and surveillance: Secondary | ICD-10-CM | POA: Diagnosis not present

## 2016-06-12 DIAGNOSIS — I10 Essential (primary) hypertension: Secondary | ICD-10-CM | POA: Diagnosis not present

## 2016-06-12 DIAGNOSIS — Z6834 Body mass index (BMI) 34.0-34.9, adult: Secondary | ICD-10-CM | POA: Diagnosis not present

## 2016-08-03 DIAGNOSIS — Z6833 Body mass index (BMI) 33.0-33.9, adult: Secondary | ICD-10-CM | POA: Diagnosis not present

## 2016-08-03 DIAGNOSIS — Z299 Encounter for prophylactic measures, unspecified: Secondary | ICD-10-CM | POA: Diagnosis not present

## 2016-08-03 DIAGNOSIS — E1165 Type 2 diabetes mellitus with hyperglycemia: Secondary | ICD-10-CM | POA: Diagnosis not present

## 2016-08-03 DIAGNOSIS — Z713 Dietary counseling and surveillance: Secondary | ICD-10-CM | POA: Diagnosis not present

## 2016-08-03 DIAGNOSIS — I2510 Atherosclerotic heart disease of native coronary artery without angina pectoris: Secondary | ICD-10-CM | POA: Diagnosis not present

## 2016-08-03 DIAGNOSIS — I340 Nonrheumatic mitral (valve) insufficiency: Secondary | ICD-10-CM | POA: Diagnosis not present

## 2016-08-03 DIAGNOSIS — F419 Anxiety disorder, unspecified: Secondary | ICD-10-CM | POA: Diagnosis not present

## 2016-08-03 DIAGNOSIS — R5383 Other fatigue: Secondary | ICD-10-CM | POA: Diagnosis not present

## 2016-08-03 DIAGNOSIS — I10 Essential (primary) hypertension: Secondary | ICD-10-CM | POA: Diagnosis not present

## 2016-08-25 DIAGNOSIS — E119 Type 2 diabetes mellitus without complications: Secondary | ICD-10-CM | POA: Diagnosis not present

## 2016-08-25 DIAGNOSIS — I10 Essential (primary) hypertension: Secondary | ICD-10-CM | POA: Diagnosis not present

## 2016-08-25 DIAGNOSIS — M159 Polyosteoarthritis, unspecified: Secondary | ICD-10-CM | POA: Diagnosis not present

## 2016-09-24 DIAGNOSIS — I10 Essential (primary) hypertension: Secondary | ICD-10-CM | POA: Diagnosis not present

## 2016-09-24 DIAGNOSIS — M159 Polyosteoarthritis, unspecified: Secondary | ICD-10-CM | POA: Diagnosis not present

## 2016-09-24 DIAGNOSIS — E119 Type 2 diabetes mellitus without complications: Secondary | ICD-10-CM | POA: Diagnosis not present

## 2016-11-09 DIAGNOSIS — F419 Anxiety disorder, unspecified: Secondary | ICD-10-CM | POA: Diagnosis not present

## 2016-11-09 DIAGNOSIS — Z125 Encounter for screening for malignant neoplasm of prostate: Secondary | ICD-10-CM | POA: Diagnosis not present

## 2016-11-09 DIAGNOSIS — Z299 Encounter for prophylactic measures, unspecified: Secondary | ICD-10-CM | POA: Diagnosis not present

## 2016-11-09 DIAGNOSIS — I2510 Atherosclerotic heart disease of native coronary artery without angina pectoris: Secondary | ICD-10-CM | POA: Diagnosis not present

## 2016-11-09 DIAGNOSIS — Z6833 Body mass index (BMI) 33.0-33.9, adult: Secondary | ICD-10-CM | POA: Diagnosis not present

## 2016-11-09 DIAGNOSIS — I10 Essential (primary) hypertension: Secondary | ICD-10-CM | POA: Diagnosis not present

## 2016-11-09 DIAGNOSIS — Z1211 Encounter for screening for malignant neoplasm of colon: Secondary | ICD-10-CM | POA: Diagnosis not present

## 2016-11-09 DIAGNOSIS — Z1389 Encounter for screening for other disorder: Secondary | ICD-10-CM | POA: Diagnosis not present

## 2016-11-09 DIAGNOSIS — R5383 Other fatigue: Secondary | ICD-10-CM | POA: Diagnosis not present

## 2016-11-09 DIAGNOSIS — Z7189 Other specified counseling: Secondary | ICD-10-CM | POA: Diagnosis not present

## 2016-11-09 DIAGNOSIS — E1165 Type 2 diabetes mellitus with hyperglycemia: Secondary | ICD-10-CM | POA: Diagnosis not present

## 2016-11-09 DIAGNOSIS — Z0000 Encounter for general adult medical examination without abnormal findings: Secondary | ICD-10-CM | POA: Diagnosis not present

## 2016-11-09 DIAGNOSIS — E7800 Pure hypercholesterolemia, unspecified: Secondary | ICD-10-CM | POA: Diagnosis not present

## 2016-11-09 DIAGNOSIS — Z79899 Other long term (current) drug therapy: Secondary | ICD-10-CM | POA: Diagnosis not present

## 2016-12-11 DIAGNOSIS — Z299 Encounter for prophylactic measures, unspecified: Secondary | ICD-10-CM | POA: Diagnosis not present

## 2016-12-11 DIAGNOSIS — I340 Nonrheumatic mitral (valve) insufficiency: Secondary | ICD-10-CM | POA: Diagnosis not present

## 2016-12-11 DIAGNOSIS — E1165 Type 2 diabetes mellitus with hyperglycemia: Secondary | ICD-10-CM | POA: Diagnosis not present

## 2016-12-11 DIAGNOSIS — Z6833 Body mass index (BMI) 33.0-33.9, adult: Secondary | ICD-10-CM | POA: Diagnosis not present

## 2016-12-11 DIAGNOSIS — I10 Essential (primary) hypertension: Secondary | ICD-10-CM | POA: Diagnosis not present

## 2016-12-11 DIAGNOSIS — F419 Anxiety disorder, unspecified: Secondary | ICD-10-CM | POA: Diagnosis not present

## 2016-12-11 DIAGNOSIS — E7800 Pure hypercholesterolemia, unspecified: Secondary | ICD-10-CM | POA: Diagnosis not present

## 2016-12-11 DIAGNOSIS — M25561 Pain in right knee: Secondary | ICD-10-CM | POA: Diagnosis not present

## 2016-12-11 DIAGNOSIS — I2510 Atherosclerotic heart disease of native coronary artery without angina pectoris: Secondary | ICD-10-CM | POA: Diagnosis not present

## 2016-12-11 DIAGNOSIS — G4700 Insomnia, unspecified: Secondary | ICD-10-CM | POA: Diagnosis not present

## 2016-12-22 DIAGNOSIS — Z299 Encounter for prophylactic measures, unspecified: Secondary | ICD-10-CM | POA: Diagnosis not present

## 2016-12-22 DIAGNOSIS — I10 Essential (primary) hypertension: Secondary | ICD-10-CM | POA: Diagnosis not present

## 2016-12-22 DIAGNOSIS — E7800 Pure hypercholesterolemia, unspecified: Secondary | ICD-10-CM | POA: Diagnosis not present

## 2016-12-22 DIAGNOSIS — E1165 Type 2 diabetes mellitus with hyperglycemia: Secondary | ICD-10-CM | POA: Diagnosis not present

## 2016-12-22 DIAGNOSIS — Z713 Dietary counseling and surveillance: Secondary | ICD-10-CM | POA: Diagnosis not present

## 2016-12-22 DIAGNOSIS — I2510 Atherosclerotic heart disease of native coronary artery without angina pectoris: Secondary | ICD-10-CM | POA: Diagnosis not present

## 2016-12-22 DIAGNOSIS — M1990 Unspecified osteoarthritis, unspecified site: Secondary | ICD-10-CM | POA: Diagnosis not present

## 2016-12-22 DIAGNOSIS — F419 Anxiety disorder, unspecified: Secondary | ICD-10-CM | POA: Diagnosis not present

## 2017-01-12 DIAGNOSIS — I781 Nevus, non-neoplastic: Secondary | ICD-10-CM | POA: Diagnosis not present

## 2017-01-12 DIAGNOSIS — Z299 Encounter for prophylactic measures, unspecified: Secondary | ICD-10-CM | POA: Diagnosis not present

## 2017-01-12 DIAGNOSIS — D649 Anemia, unspecified: Secondary | ICD-10-CM | POA: Diagnosis not present

## 2017-01-12 DIAGNOSIS — D492 Neoplasm of unspecified behavior of bone, soft tissue, and skin: Secondary | ICD-10-CM | POA: Diagnosis not present

## 2017-01-12 DIAGNOSIS — Z6832 Body mass index (BMI) 32.0-32.9, adult: Secondary | ICD-10-CM | POA: Diagnosis not present

## 2017-01-13 DIAGNOSIS — E119 Type 2 diabetes mellitus without complications: Secondary | ICD-10-CM | POA: Diagnosis not present

## 2017-01-13 DIAGNOSIS — I2510 Atherosclerotic heart disease of native coronary artery without angina pectoris: Secondary | ICD-10-CM | POA: Diagnosis not present

## 2017-01-13 DIAGNOSIS — M159 Polyosteoarthritis, unspecified: Secondary | ICD-10-CM | POA: Diagnosis not present

## 2017-01-13 DIAGNOSIS — I10 Essential (primary) hypertension: Secondary | ICD-10-CM | POA: Diagnosis not present

## 2017-03-03 DIAGNOSIS — M79605 Pain in left leg: Secondary | ICD-10-CM | POA: Diagnosis not present

## 2017-03-03 DIAGNOSIS — L723 Sebaceous cyst: Secondary | ICD-10-CM | POA: Diagnosis not present

## 2017-03-03 DIAGNOSIS — E7800 Pure hypercholesterolemia, unspecified: Secondary | ICD-10-CM | POA: Diagnosis not present

## 2017-03-03 DIAGNOSIS — Z6833 Body mass index (BMI) 33.0-33.9, adult: Secondary | ICD-10-CM | POA: Diagnosis not present

## 2017-03-03 DIAGNOSIS — M79604 Pain in right leg: Secondary | ICD-10-CM | POA: Diagnosis not present

## 2017-03-03 DIAGNOSIS — E1165 Type 2 diabetes mellitus with hyperglycemia: Secondary | ICD-10-CM | POA: Diagnosis not present

## 2017-03-03 DIAGNOSIS — I10 Essential (primary) hypertension: Secondary | ICD-10-CM | POA: Diagnosis not present

## 2017-03-03 DIAGNOSIS — B353 Tinea pedis: Secondary | ICD-10-CM | POA: Diagnosis not present

## 2017-03-03 DIAGNOSIS — Z299 Encounter for prophylactic measures, unspecified: Secondary | ICD-10-CM | POA: Diagnosis not present

## 2017-03-03 DIAGNOSIS — F419 Anxiety disorder, unspecified: Secondary | ICD-10-CM | POA: Diagnosis not present

## 2017-03-11 ENCOUNTER — Ambulatory Visit: Payer: PPO | Admitting: Podiatry

## 2017-03-11 ENCOUNTER — Encounter: Payer: Self-pay | Admitting: Podiatry

## 2017-03-11 DIAGNOSIS — L309 Dermatitis, unspecified: Secondary | ICD-10-CM

## 2017-03-11 NOTE — Progress Notes (Signed)
Subjective:    Patient ID: Bradley Sheppard, male   DOB: 76 y.o.   MRN: [id number]   HPI patient presents with wife concerned about discoloration of the right second digit stating he's been taking medicine topically given to him by his family physician    ROS      Objective:  Physical Exam neurovascular status intact with a red irritated area on the medial side of the right second digit with no proximal edema erythema or no drainage associated with it     Assessment:    Probability for dermatitis or mild localized fungal infection of the right second toe     Plan:   Topical includes antifungal and steroidal cream and he will continue to apply that and if it should change all her want him to see dermatologist or it should not improve. If any drainage or proximal swelling should occur he will see me

## 2017-03-15 DIAGNOSIS — M79604 Pain in right leg: Secondary | ICD-10-CM | POA: Diagnosis not present

## 2017-03-17 DIAGNOSIS — I2510 Atherosclerotic heart disease of native coronary artery without angina pectoris: Secondary | ICD-10-CM | POA: Diagnosis not present

## 2017-03-17 DIAGNOSIS — I10 Essential (primary) hypertension: Secondary | ICD-10-CM | POA: Diagnosis not present

## 2017-03-17 DIAGNOSIS — E119 Type 2 diabetes mellitus without complications: Secondary | ICD-10-CM | POA: Diagnosis not present

## 2017-03-17 DIAGNOSIS — M159 Polyosteoarthritis, unspecified: Secondary | ICD-10-CM | POA: Diagnosis not present

## 2017-04-07 DIAGNOSIS — M25511 Pain in right shoulder: Secondary | ICD-10-CM | POA: Diagnosis not present

## 2017-04-07 DIAGNOSIS — D509 Iron deficiency anemia, unspecified: Secondary | ICD-10-CM | POA: Diagnosis not present

## 2017-04-07 DIAGNOSIS — I10 Essential (primary) hypertension: Secondary | ICD-10-CM | POA: Diagnosis not present

## 2017-04-07 DIAGNOSIS — E1165 Type 2 diabetes mellitus with hyperglycemia: Secondary | ICD-10-CM | POA: Diagnosis not present

## 2017-04-07 DIAGNOSIS — Z299 Encounter for prophylactic measures, unspecified: Secondary | ICD-10-CM | POA: Diagnosis not present

## 2017-04-07 DIAGNOSIS — Z6832 Body mass index (BMI) 32.0-32.9, adult: Secondary | ICD-10-CM | POA: Diagnosis not present

## 2017-04-07 DIAGNOSIS — E7800 Pure hypercholesterolemia, unspecified: Secondary | ICD-10-CM | POA: Diagnosis not present

## 2017-07-02 DIAGNOSIS — J069 Acute upper respiratory infection, unspecified: Secondary | ICD-10-CM | POA: Diagnosis not present

## 2017-07-02 DIAGNOSIS — I10 Essential (primary) hypertension: Secondary | ICD-10-CM | POA: Diagnosis not present

## 2017-07-02 DIAGNOSIS — Z6833 Body mass index (BMI) 33.0-33.9, adult: Secondary | ICD-10-CM | POA: Diagnosis not present

## 2017-07-02 DIAGNOSIS — Z299 Encounter for prophylactic measures, unspecified: Secondary | ICD-10-CM | POA: Diagnosis not present

## 2017-07-02 DIAGNOSIS — Z713 Dietary counseling and surveillance: Secondary | ICD-10-CM | POA: Diagnosis not present

## 2017-07-13 DIAGNOSIS — D649 Anemia, unspecified: Secondary | ICD-10-CM | POA: Diagnosis not present

## 2017-07-13 DIAGNOSIS — E7800 Pure hypercholesterolemia, unspecified: Secondary | ICD-10-CM | POA: Diagnosis not present

## 2017-07-13 DIAGNOSIS — Z299 Encounter for prophylactic measures, unspecified: Secondary | ICD-10-CM | POA: Diagnosis not present

## 2017-07-13 DIAGNOSIS — E1165 Type 2 diabetes mellitus with hyperglycemia: Secondary | ICD-10-CM | POA: Diagnosis not present

## 2017-07-13 DIAGNOSIS — F419 Anxiety disorder, unspecified: Secondary | ICD-10-CM | POA: Diagnosis not present

## 2017-07-13 DIAGNOSIS — Z6833 Body mass index (BMI) 33.0-33.9, adult: Secondary | ICD-10-CM | POA: Diagnosis not present

## 2017-07-13 DIAGNOSIS — I2510 Atherosclerotic heart disease of native coronary artery without angina pectoris: Secondary | ICD-10-CM | POA: Diagnosis not present

## 2017-07-13 DIAGNOSIS — I10 Essential (primary) hypertension: Secondary | ICD-10-CM | POA: Diagnosis not present

## 2017-10-18 DIAGNOSIS — I340 Nonrheumatic mitral (valve) insufficiency: Secondary | ICD-10-CM | POA: Diagnosis not present

## 2017-10-18 DIAGNOSIS — I10 Essential (primary) hypertension: Secondary | ICD-10-CM | POA: Diagnosis not present

## 2017-10-18 DIAGNOSIS — M1710 Unilateral primary osteoarthritis, unspecified knee: Secondary | ICD-10-CM | POA: Diagnosis not present

## 2017-10-18 DIAGNOSIS — Z6833 Body mass index (BMI) 33.0-33.9, adult: Secondary | ICD-10-CM | POA: Diagnosis not present

## 2017-10-18 DIAGNOSIS — D649 Anemia, unspecified: Secondary | ICD-10-CM | POA: Diagnosis not present

## 2017-10-18 DIAGNOSIS — Z299 Encounter for prophylactic measures, unspecified: Secondary | ICD-10-CM | POA: Diagnosis not present

## 2017-10-18 DIAGNOSIS — E1165 Type 2 diabetes mellitus with hyperglycemia: Secondary | ICD-10-CM | POA: Diagnosis not present

## 2017-10-28 ENCOUNTER — Ambulatory Visit: Payer: PPO | Admitting: Podiatry

## 2017-10-28 ENCOUNTER — Ambulatory Visit: Payer: PPO | Admitting: Orthotics

## 2017-10-28 DIAGNOSIS — L309 Dermatitis, unspecified: Secondary | ICD-10-CM

## 2017-10-28 DIAGNOSIS — L84 Corns and callosities: Secondary | ICD-10-CM

## 2017-10-28 NOTE — Progress Notes (Signed)
Patient seen today to be evaluated for LLD; however, patient hasn't seen doctor in quite awhile and I could not determine if there was actually a LLD or hip mal alignment.  Talked with Lynn Ito in PT and she suggested his PCP send a script over and she would do complete eval.

## 2017-10-29 NOTE — Progress Notes (Signed)
Patient was seen today for orthotic adjustment

## 2017-11-02 DIAGNOSIS — M25462 Effusion, left knee: Secondary | ICD-10-CM | POA: Diagnosis not present

## 2017-11-02 DIAGNOSIS — M25562 Pain in left knee: Secondary | ICD-10-CM | POA: Diagnosis not present

## 2017-11-02 DIAGNOSIS — R269 Unspecified abnormalities of gait and mobility: Secondary | ICD-10-CM | POA: Diagnosis not present

## 2017-11-02 DIAGNOSIS — M25662 Stiffness of left knee, not elsewhere classified: Secondary | ICD-10-CM | POA: Diagnosis not present

## 2017-11-02 DIAGNOSIS — M6281 Muscle weakness (generalized): Secondary | ICD-10-CM | POA: Diagnosis not present

## 2017-11-02 DIAGNOSIS — R296 Repeated falls: Secondary | ICD-10-CM | POA: Diagnosis not present

## 2017-11-02 DIAGNOSIS — M25561 Pain in right knee: Secondary | ICD-10-CM | POA: Diagnosis not present

## 2017-11-02 DIAGNOSIS — I10 Essential (primary) hypertension: Secondary | ICD-10-CM | POA: Diagnosis not present

## 2017-11-02 DIAGNOSIS — E118 Type 2 diabetes mellitus with unspecified complications: Secondary | ICD-10-CM | POA: Diagnosis not present

## 2017-11-08 DIAGNOSIS — M25462 Effusion, left knee: Secondary | ICD-10-CM | POA: Diagnosis not present

## 2017-11-08 DIAGNOSIS — M25561 Pain in right knee: Secondary | ICD-10-CM | POA: Diagnosis not present

## 2017-11-08 DIAGNOSIS — M25662 Stiffness of left knee, not elsewhere classified: Secondary | ICD-10-CM | POA: Diagnosis not present

## 2017-11-08 DIAGNOSIS — R296 Repeated falls: Secondary | ICD-10-CM | POA: Diagnosis not present

## 2017-11-08 DIAGNOSIS — I10 Essential (primary) hypertension: Secondary | ICD-10-CM | POA: Diagnosis not present

## 2017-11-08 DIAGNOSIS — E118 Type 2 diabetes mellitus with unspecified complications: Secondary | ICD-10-CM | POA: Diagnosis not present

## 2017-11-08 DIAGNOSIS — R269 Unspecified abnormalities of gait and mobility: Secondary | ICD-10-CM | POA: Diagnosis not present

## 2017-11-08 DIAGNOSIS — M6281 Muscle weakness (generalized): Secondary | ICD-10-CM | POA: Diagnosis not present

## 2017-11-08 DIAGNOSIS — M25562 Pain in left knee: Secondary | ICD-10-CM | POA: Diagnosis not present

## 2017-11-09 DIAGNOSIS — E118 Type 2 diabetes mellitus with unspecified complications: Secondary | ICD-10-CM | POA: Diagnosis not present

## 2017-11-09 DIAGNOSIS — M25662 Stiffness of left knee, not elsewhere classified: Secondary | ICD-10-CM | POA: Diagnosis not present

## 2017-11-09 DIAGNOSIS — M6281 Muscle weakness (generalized): Secondary | ICD-10-CM | POA: Diagnosis not present

## 2017-11-09 DIAGNOSIS — M25561 Pain in right knee: Secondary | ICD-10-CM | POA: Diagnosis not present

## 2017-11-09 DIAGNOSIS — M25562 Pain in left knee: Secondary | ICD-10-CM | POA: Diagnosis not present

## 2017-11-09 DIAGNOSIS — R296 Repeated falls: Secondary | ICD-10-CM | POA: Diagnosis not present

## 2017-11-09 DIAGNOSIS — M25462 Effusion, left knee: Secondary | ICD-10-CM | POA: Diagnosis not present

## 2017-11-09 DIAGNOSIS — I10 Essential (primary) hypertension: Secondary | ICD-10-CM | POA: Diagnosis not present

## 2017-11-09 DIAGNOSIS — R269 Unspecified abnormalities of gait and mobility: Secondary | ICD-10-CM | POA: Diagnosis not present

## 2017-11-11 DIAGNOSIS — R296 Repeated falls: Secondary | ICD-10-CM | POA: Diagnosis not present

## 2017-11-11 DIAGNOSIS — I10 Essential (primary) hypertension: Secondary | ICD-10-CM | POA: Diagnosis not present

## 2017-11-11 DIAGNOSIS — M25562 Pain in left knee: Secondary | ICD-10-CM | POA: Diagnosis not present

## 2017-11-11 DIAGNOSIS — E118 Type 2 diabetes mellitus with unspecified complications: Secondary | ICD-10-CM | POA: Diagnosis not present

## 2017-11-11 DIAGNOSIS — M6281 Muscle weakness (generalized): Secondary | ICD-10-CM | POA: Diagnosis not present

## 2017-11-11 DIAGNOSIS — R269 Unspecified abnormalities of gait and mobility: Secondary | ICD-10-CM | POA: Diagnosis not present

## 2017-11-11 DIAGNOSIS — M25561 Pain in right knee: Secondary | ICD-10-CM | POA: Diagnosis not present

## 2017-11-11 DIAGNOSIS — M25462 Effusion, left knee: Secondary | ICD-10-CM | POA: Diagnosis not present

## 2017-11-11 DIAGNOSIS — M25662 Stiffness of left knee, not elsewhere classified: Secondary | ICD-10-CM | POA: Diagnosis not present

## 2017-11-15 DIAGNOSIS — I2510 Atherosclerotic heart disease of native coronary artery without angina pectoris: Secondary | ICD-10-CM | POA: Diagnosis not present

## 2017-11-15 DIAGNOSIS — Z0000 Encounter for general adult medical examination without abnormal findings: Secondary | ICD-10-CM | POA: Diagnosis not present

## 2017-11-15 DIAGNOSIS — Z1211 Encounter for screening for malignant neoplasm of colon: Secondary | ICD-10-CM | POA: Diagnosis not present

## 2017-11-15 DIAGNOSIS — E7800 Pure hypercholesterolemia, unspecified: Secondary | ICD-10-CM | POA: Diagnosis not present

## 2017-11-15 DIAGNOSIS — M25511 Pain in right shoulder: Secondary | ICD-10-CM | POA: Diagnosis not present

## 2017-11-15 DIAGNOSIS — R5383 Other fatigue: Secondary | ICD-10-CM | POA: Diagnosis not present

## 2017-11-15 DIAGNOSIS — Z299 Encounter for prophylactic measures, unspecified: Secondary | ICD-10-CM | POA: Diagnosis not present

## 2017-11-15 DIAGNOSIS — I10 Essential (primary) hypertension: Secondary | ICD-10-CM | POA: Diagnosis not present

## 2017-11-15 DIAGNOSIS — M1710 Unilateral primary osteoarthritis, unspecified knee: Secondary | ICD-10-CM | POA: Diagnosis not present

## 2017-11-15 DIAGNOSIS — Z1331 Encounter for screening for depression: Secondary | ICD-10-CM | POA: Diagnosis not present

## 2017-11-15 DIAGNOSIS — Z7189 Other specified counseling: Secondary | ICD-10-CM | POA: Diagnosis not present

## 2017-11-15 DIAGNOSIS — F17210 Nicotine dependence, cigarettes, uncomplicated: Secondary | ICD-10-CM | POA: Diagnosis not present

## 2017-11-15 DIAGNOSIS — Z1339 Encounter for screening examination for other mental health and behavioral disorders: Secondary | ICD-10-CM | POA: Diagnosis not present

## 2017-11-15 DIAGNOSIS — Z6833 Body mass index (BMI) 33.0-33.9, adult: Secondary | ICD-10-CM | POA: Diagnosis not present

## 2017-11-15 DIAGNOSIS — Z125 Encounter for screening for malignant neoplasm of prostate: Secondary | ICD-10-CM | POA: Diagnosis not present

## 2017-11-15 DIAGNOSIS — Z79899 Other long term (current) drug therapy: Secondary | ICD-10-CM | POA: Diagnosis not present

## 2017-11-16 DIAGNOSIS — E118 Type 2 diabetes mellitus with unspecified complications: Secondary | ICD-10-CM | POA: Diagnosis not present

## 2017-11-16 DIAGNOSIS — I10 Essential (primary) hypertension: Secondary | ICD-10-CM | POA: Diagnosis not present

## 2017-11-16 DIAGNOSIS — M25562 Pain in left knee: Secondary | ICD-10-CM | POA: Diagnosis not present

## 2017-11-16 DIAGNOSIS — M25462 Effusion, left knee: Secondary | ICD-10-CM | POA: Diagnosis not present

## 2017-11-16 DIAGNOSIS — R296 Repeated falls: Secondary | ICD-10-CM | POA: Diagnosis not present

## 2017-11-16 DIAGNOSIS — R269 Unspecified abnormalities of gait and mobility: Secondary | ICD-10-CM | POA: Diagnosis not present

## 2017-11-16 DIAGNOSIS — M25662 Stiffness of left knee, not elsewhere classified: Secondary | ICD-10-CM | POA: Diagnosis not present

## 2017-11-16 DIAGNOSIS — M25561 Pain in right knee: Secondary | ICD-10-CM | POA: Diagnosis not present

## 2017-11-16 DIAGNOSIS — M6281 Muscle weakness (generalized): Secondary | ICD-10-CM | POA: Diagnosis not present

## 2017-11-17 DIAGNOSIS — M6281 Muscle weakness (generalized): Secondary | ICD-10-CM | POA: Diagnosis not present

## 2017-11-17 DIAGNOSIS — R269 Unspecified abnormalities of gait and mobility: Secondary | ICD-10-CM | POA: Diagnosis not present

## 2017-11-17 DIAGNOSIS — R296 Repeated falls: Secondary | ICD-10-CM | POA: Diagnosis not present

## 2017-11-17 DIAGNOSIS — M25462 Effusion, left knee: Secondary | ICD-10-CM | POA: Diagnosis not present

## 2017-11-17 DIAGNOSIS — E118 Type 2 diabetes mellitus with unspecified complications: Secondary | ICD-10-CM | POA: Diagnosis not present

## 2017-11-17 DIAGNOSIS — M25562 Pain in left knee: Secondary | ICD-10-CM | POA: Diagnosis not present

## 2017-11-17 DIAGNOSIS — M25662 Stiffness of left knee, not elsewhere classified: Secondary | ICD-10-CM | POA: Diagnosis not present

## 2017-11-17 DIAGNOSIS — M25561 Pain in right knee: Secondary | ICD-10-CM | POA: Diagnosis not present

## 2017-11-17 DIAGNOSIS — I10 Essential (primary) hypertension: Secondary | ICD-10-CM | POA: Diagnosis not present

## 2017-11-18 DIAGNOSIS — R269 Unspecified abnormalities of gait and mobility: Secondary | ICD-10-CM | POA: Diagnosis not present

## 2017-11-18 DIAGNOSIS — M25662 Stiffness of left knee, not elsewhere classified: Secondary | ICD-10-CM | POA: Diagnosis not present

## 2017-11-18 DIAGNOSIS — M25462 Effusion, left knee: Secondary | ICD-10-CM | POA: Diagnosis not present

## 2017-11-18 DIAGNOSIS — M25562 Pain in left knee: Secondary | ICD-10-CM | POA: Diagnosis not present

## 2017-11-23 DIAGNOSIS — I10 Essential (primary) hypertension: Secondary | ICD-10-CM | POA: Diagnosis not present

## 2017-11-23 DIAGNOSIS — M25462 Effusion, left knee: Secondary | ICD-10-CM | POA: Diagnosis not present

## 2017-11-23 DIAGNOSIS — M25562 Pain in left knee: Secondary | ICD-10-CM | POA: Diagnosis not present

## 2017-11-23 DIAGNOSIS — R269 Unspecified abnormalities of gait and mobility: Secondary | ICD-10-CM | POA: Diagnosis not present

## 2017-11-23 DIAGNOSIS — M6281 Muscle weakness (generalized): Secondary | ICD-10-CM | POA: Diagnosis not present

## 2017-11-23 DIAGNOSIS — M25561 Pain in right knee: Secondary | ICD-10-CM | POA: Diagnosis not present

## 2017-11-23 DIAGNOSIS — M25662 Stiffness of left knee, not elsewhere classified: Secondary | ICD-10-CM | POA: Diagnosis not present

## 2017-11-23 DIAGNOSIS — R296 Repeated falls: Secondary | ICD-10-CM | POA: Diagnosis not present

## 2017-11-23 DIAGNOSIS — E118 Type 2 diabetes mellitus with unspecified complications: Secondary | ICD-10-CM | POA: Diagnosis not present

## 2017-11-25 DIAGNOSIS — R296 Repeated falls: Secondary | ICD-10-CM | POA: Diagnosis not present

## 2017-11-25 DIAGNOSIS — M25662 Stiffness of left knee, not elsewhere classified: Secondary | ICD-10-CM | POA: Diagnosis not present

## 2017-11-25 DIAGNOSIS — M25561 Pain in right knee: Secondary | ICD-10-CM | POA: Diagnosis not present

## 2017-11-25 DIAGNOSIS — I10 Essential (primary) hypertension: Secondary | ICD-10-CM | POA: Diagnosis not present

## 2017-11-25 DIAGNOSIS — R269 Unspecified abnormalities of gait and mobility: Secondary | ICD-10-CM | POA: Diagnosis not present

## 2017-11-25 DIAGNOSIS — M25462 Effusion, left knee: Secondary | ICD-10-CM | POA: Diagnosis not present

## 2017-11-25 DIAGNOSIS — M25562 Pain in left knee: Secondary | ICD-10-CM | POA: Diagnosis not present

## 2017-11-25 DIAGNOSIS — M6281 Muscle weakness (generalized): Secondary | ICD-10-CM | POA: Diagnosis not present

## 2017-11-25 DIAGNOSIS — E118 Type 2 diabetes mellitus with unspecified complications: Secondary | ICD-10-CM | POA: Diagnosis not present

## 2017-11-26 DIAGNOSIS — M6281 Muscle weakness (generalized): Secondary | ICD-10-CM | POA: Diagnosis not present

## 2017-11-26 DIAGNOSIS — M25462 Effusion, left knee: Secondary | ICD-10-CM | POA: Diagnosis not present

## 2017-11-26 DIAGNOSIS — E118 Type 2 diabetes mellitus with unspecified complications: Secondary | ICD-10-CM | POA: Diagnosis not present

## 2017-11-26 DIAGNOSIS — I10 Essential (primary) hypertension: Secondary | ICD-10-CM | POA: Diagnosis not present

## 2017-11-26 DIAGNOSIS — R269 Unspecified abnormalities of gait and mobility: Secondary | ICD-10-CM | POA: Diagnosis not present

## 2017-11-26 DIAGNOSIS — R296 Repeated falls: Secondary | ICD-10-CM | POA: Diagnosis not present

## 2017-11-26 DIAGNOSIS — M25561 Pain in right knee: Secondary | ICD-10-CM | POA: Diagnosis not present

## 2017-11-26 DIAGNOSIS — M25562 Pain in left knee: Secondary | ICD-10-CM | POA: Diagnosis not present

## 2017-11-26 DIAGNOSIS — M25662 Stiffness of left knee, not elsewhere classified: Secondary | ICD-10-CM | POA: Diagnosis not present

## 2017-11-29 DIAGNOSIS — M6281 Muscle weakness (generalized): Secondary | ICD-10-CM | POA: Diagnosis not present

## 2017-11-29 DIAGNOSIS — Z7689 Persons encountering health services in other specified circumstances: Secondary | ICD-10-CM | POA: Diagnosis not present

## 2017-11-29 DIAGNOSIS — M25562 Pain in left knee: Secondary | ICD-10-CM | POA: Diagnosis not present

## 2017-11-29 DIAGNOSIS — R296 Repeated falls: Secondary | ICD-10-CM | POA: Diagnosis not present

## 2017-11-29 DIAGNOSIS — E118 Type 2 diabetes mellitus with unspecified complications: Secondary | ICD-10-CM | POA: Diagnosis not present

## 2017-11-29 DIAGNOSIS — I10 Essential (primary) hypertension: Secondary | ICD-10-CM | POA: Diagnosis not present

## 2017-11-29 DIAGNOSIS — R269 Unspecified abnormalities of gait and mobility: Secondary | ICD-10-CM | POA: Diagnosis not present

## 2017-11-29 DIAGNOSIS — M25462 Effusion, left knee: Secondary | ICD-10-CM | POA: Diagnosis not present

## 2017-11-29 DIAGNOSIS — M25561 Pain in right knee: Secondary | ICD-10-CM | POA: Diagnosis not present

## 2017-11-29 DIAGNOSIS — M25662 Stiffness of left knee, not elsewhere classified: Secondary | ICD-10-CM | POA: Diagnosis not present

## 2017-12-01 DIAGNOSIS — M25561 Pain in right knee: Secondary | ICD-10-CM | POA: Diagnosis not present

## 2017-12-01 DIAGNOSIS — M25462 Effusion, left knee: Secondary | ICD-10-CM | POA: Diagnosis not present

## 2017-12-01 DIAGNOSIS — E118 Type 2 diabetes mellitus with unspecified complications: Secondary | ICD-10-CM | POA: Diagnosis not present

## 2017-12-01 DIAGNOSIS — R296 Repeated falls: Secondary | ICD-10-CM | POA: Diagnosis not present

## 2017-12-01 DIAGNOSIS — M25562 Pain in left knee: Secondary | ICD-10-CM | POA: Diagnosis not present

## 2017-12-01 DIAGNOSIS — I10 Essential (primary) hypertension: Secondary | ICD-10-CM | POA: Diagnosis not present

## 2017-12-01 DIAGNOSIS — R269 Unspecified abnormalities of gait and mobility: Secondary | ICD-10-CM | POA: Diagnosis not present

## 2017-12-01 DIAGNOSIS — M25662 Stiffness of left knee, not elsewhere classified: Secondary | ICD-10-CM | POA: Diagnosis not present

## 2017-12-01 DIAGNOSIS — M6281 Muscle weakness (generalized): Secondary | ICD-10-CM | POA: Diagnosis not present

## 2017-12-06 DIAGNOSIS — M6281 Muscle weakness (generalized): Secondary | ICD-10-CM | POA: Diagnosis not present

## 2017-12-06 DIAGNOSIS — M25562 Pain in left knee: Secondary | ICD-10-CM | POA: Diagnosis not present

## 2017-12-06 DIAGNOSIS — M25462 Effusion, left knee: Secondary | ICD-10-CM | POA: Diagnosis not present

## 2017-12-06 DIAGNOSIS — E118 Type 2 diabetes mellitus with unspecified complications: Secondary | ICD-10-CM | POA: Diagnosis not present

## 2017-12-06 DIAGNOSIS — M25561 Pain in right knee: Secondary | ICD-10-CM | POA: Diagnosis not present

## 2017-12-06 DIAGNOSIS — R269 Unspecified abnormalities of gait and mobility: Secondary | ICD-10-CM | POA: Diagnosis not present

## 2017-12-06 DIAGNOSIS — M25662 Stiffness of left knee, not elsewhere classified: Secondary | ICD-10-CM | POA: Diagnosis not present

## 2017-12-06 DIAGNOSIS — R296 Repeated falls: Secondary | ICD-10-CM | POA: Diagnosis not present

## 2017-12-06 DIAGNOSIS — I10 Essential (primary) hypertension: Secondary | ICD-10-CM | POA: Diagnosis not present

## 2017-12-09 DIAGNOSIS — M25561 Pain in right knee: Secondary | ICD-10-CM | POA: Diagnosis not present

## 2017-12-09 DIAGNOSIS — I10 Essential (primary) hypertension: Secondary | ICD-10-CM | POA: Diagnosis not present

## 2017-12-09 DIAGNOSIS — M25462 Effusion, left knee: Secondary | ICD-10-CM | POA: Diagnosis not present

## 2017-12-09 DIAGNOSIS — R269 Unspecified abnormalities of gait and mobility: Secondary | ICD-10-CM | POA: Diagnosis not present

## 2017-12-09 DIAGNOSIS — M25562 Pain in left knee: Secondary | ICD-10-CM | POA: Diagnosis not present

## 2017-12-09 DIAGNOSIS — R296 Repeated falls: Secondary | ICD-10-CM | POA: Diagnosis not present

## 2017-12-09 DIAGNOSIS — M6281 Muscle weakness (generalized): Secondary | ICD-10-CM | POA: Diagnosis not present

## 2017-12-09 DIAGNOSIS — M25662 Stiffness of left knee, not elsewhere classified: Secondary | ICD-10-CM | POA: Diagnosis not present

## 2017-12-09 DIAGNOSIS — E118 Type 2 diabetes mellitus with unspecified complications: Secondary | ICD-10-CM | POA: Diagnosis not present

## 2017-12-15 DIAGNOSIS — F419 Anxiety disorder, unspecified: Secondary | ICD-10-CM | POA: Diagnosis not present

## 2017-12-15 DIAGNOSIS — I10 Essential (primary) hypertension: Secondary | ICD-10-CM | POA: Diagnosis not present

## 2017-12-15 DIAGNOSIS — Z299 Encounter for prophylactic measures, unspecified: Secondary | ICD-10-CM | POA: Diagnosis not present

## 2017-12-15 DIAGNOSIS — M25561 Pain in right knee: Secondary | ICD-10-CM | POA: Diagnosis not present

## 2017-12-15 DIAGNOSIS — Z6833 Body mass index (BMI) 33.0-33.9, adult: Secondary | ICD-10-CM | POA: Diagnosis not present

## 2017-12-15 DIAGNOSIS — E1165 Type 2 diabetes mellitus with hyperglycemia: Secondary | ICD-10-CM | POA: Diagnosis not present

## 2017-12-15 DIAGNOSIS — E7800 Pure hypercholesterolemia, unspecified: Secondary | ICD-10-CM | POA: Diagnosis not present

## 2018-01-05 DIAGNOSIS — M1710 Unilateral primary osteoarthritis, unspecified knee: Secondary | ICD-10-CM | POA: Diagnosis not present

## 2018-01-05 DIAGNOSIS — I2510 Atherosclerotic heart disease of native coronary artery without angina pectoris: Secondary | ICD-10-CM | POA: Diagnosis not present

## 2018-01-05 DIAGNOSIS — Z299 Encounter for prophylactic measures, unspecified: Secondary | ICD-10-CM | POA: Diagnosis not present

## 2018-01-05 DIAGNOSIS — M25561 Pain in right knee: Secondary | ICD-10-CM | POA: Diagnosis not present

## 2018-01-05 DIAGNOSIS — M1990 Unspecified osteoarthritis, unspecified site: Secondary | ICD-10-CM | POA: Diagnosis not present

## 2018-02-07 DIAGNOSIS — Z713 Dietary counseling and surveillance: Secondary | ICD-10-CM | POA: Diagnosis not present

## 2018-02-07 DIAGNOSIS — D649 Anemia, unspecified: Secondary | ICD-10-CM | POA: Diagnosis not present

## 2018-02-07 DIAGNOSIS — E1165 Type 2 diabetes mellitus with hyperglycemia: Secondary | ICD-10-CM | POA: Diagnosis not present

## 2018-02-07 DIAGNOSIS — Z6833 Body mass index (BMI) 33.0-33.9, adult: Secondary | ICD-10-CM | POA: Diagnosis not present

## 2018-02-07 DIAGNOSIS — I10 Essential (primary) hypertension: Secondary | ICD-10-CM | POA: Diagnosis not present

## 2018-02-25 DIAGNOSIS — Z6833 Body mass index (BMI) 33.0-33.9, adult: Secondary | ICD-10-CM | POA: Diagnosis not present

## 2018-02-25 DIAGNOSIS — J069 Acute upper respiratory infection, unspecified: Secondary | ICD-10-CM | POA: Diagnosis not present

## 2018-02-25 DIAGNOSIS — Z87891 Personal history of nicotine dependence: Secondary | ICD-10-CM | POA: Diagnosis not present

## 2018-02-25 DIAGNOSIS — E1165 Type 2 diabetes mellitus with hyperglycemia: Secondary | ICD-10-CM | POA: Diagnosis not present

## 2018-02-25 DIAGNOSIS — I10 Essential (primary) hypertension: Secondary | ICD-10-CM | POA: Diagnosis not present

## 2018-02-25 DIAGNOSIS — Z299 Encounter for prophylactic measures, unspecified: Secondary | ICD-10-CM | POA: Diagnosis not present

## 2018-03-02 DIAGNOSIS — Z6833 Body mass index (BMI) 33.0-33.9, adult: Secondary | ICD-10-CM | POA: Diagnosis not present

## 2018-03-02 DIAGNOSIS — E1165 Type 2 diabetes mellitus with hyperglycemia: Secondary | ICD-10-CM | POA: Diagnosis not present

## 2018-03-02 DIAGNOSIS — I10 Essential (primary) hypertension: Secondary | ICD-10-CM | POA: Diagnosis not present

## 2018-03-02 DIAGNOSIS — I2510 Atherosclerotic heart disease of native coronary artery without angina pectoris: Secondary | ICD-10-CM | POA: Diagnosis not present

## 2018-03-02 DIAGNOSIS — Z299 Encounter for prophylactic measures, unspecified: Secondary | ICD-10-CM | POA: Diagnosis not present

## 2018-03-02 DIAGNOSIS — J069 Acute upper respiratory infection, unspecified: Secondary | ICD-10-CM | POA: Diagnosis not present

## 2018-03-09 DIAGNOSIS — E1165 Type 2 diabetes mellitus with hyperglycemia: Secondary | ICD-10-CM | POA: Diagnosis not present

## 2018-03-09 DIAGNOSIS — I10 Essential (primary) hypertension: Secondary | ICD-10-CM | POA: Diagnosis not present

## 2018-03-09 DIAGNOSIS — Z299 Encounter for prophylactic measures, unspecified: Secondary | ICD-10-CM | POA: Diagnosis not present

## 2018-03-09 DIAGNOSIS — Z6833 Body mass index (BMI) 33.0-33.9, adult: Secondary | ICD-10-CM | POA: Diagnosis not present

## 2018-03-16 DIAGNOSIS — E1165 Type 2 diabetes mellitus with hyperglycemia: Secondary | ICD-10-CM | POA: Diagnosis not present

## 2018-03-16 DIAGNOSIS — Z299 Encounter for prophylactic measures, unspecified: Secondary | ICD-10-CM | POA: Diagnosis not present

## 2018-03-16 DIAGNOSIS — R011 Cardiac murmur, unspecified: Secondary | ICD-10-CM | POA: Diagnosis not present

## 2018-03-16 DIAGNOSIS — Z6832 Body mass index (BMI) 32.0-32.9, adult: Secondary | ICD-10-CM | POA: Diagnosis not present

## 2018-03-16 DIAGNOSIS — R0989 Other specified symptoms and signs involving the circulatory and respiratory systems: Secondary | ICD-10-CM | POA: Diagnosis not present

## 2018-03-16 DIAGNOSIS — I10 Essential (primary) hypertension: Secondary | ICD-10-CM | POA: Diagnosis not present

## 2018-03-25 DIAGNOSIS — R010 Benign and innocent cardiac murmurs: Secondary | ICD-10-CM | POA: Diagnosis not present

## 2018-03-25 DIAGNOSIS — I6529 Occlusion and stenosis of unspecified carotid artery: Secondary | ICD-10-CM | POA: Diagnosis not present

## 2018-03-25 DIAGNOSIS — Z0389 Encounter for observation for other suspected diseases and conditions ruled out: Secondary | ICD-10-CM | POA: Diagnosis not present

## 2018-03-28 DIAGNOSIS — Z79899 Other long term (current) drug therapy: Secondary | ICD-10-CM | POA: Diagnosis not present

## 2018-03-28 DIAGNOSIS — E7800 Pure hypercholesterolemia, unspecified: Secondary | ICD-10-CM | POA: Diagnosis not present

## 2018-03-28 DIAGNOSIS — I10 Essential (primary) hypertension: Secondary | ICD-10-CM | POA: Diagnosis not present

## 2018-03-28 DIAGNOSIS — E1165 Type 2 diabetes mellitus with hyperglycemia: Secondary | ICD-10-CM | POA: Diagnosis not present

## 2018-03-28 DIAGNOSIS — D509 Iron deficiency anemia, unspecified: Secondary | ICD-10-CM | POA: Diagnosis not present

## 2018-03-28 DIAGNOSIS — Z299 Encounter for prophylactic measures, unspecified: Secondary | ICD-10-CM | POA: Diagnosis not present

## 2018-03-28 DIAGNOSIS — Z6832 Body mass index (BMI) 32.0-32.9, adult: Secondary | ICD-10-CM | POA: Diagnosis not present

## 2018-04-11 DIAGNOSIS — I10 Essential (primary) hypertension: Secondary | ICD-10-CM | POA: Diagnosis not present

## 2018-04-11 DIAGNOSIS — H6120 Impacted cerumen, unspecified ear: Secondary | ICD-10-CM | POA: Diagnosis not present

## 2018-04-11 DIAGNOSIS — Z299 Encounter for prophylactic measures, unspecified: Secondary | ICD-10-CM | POA: Diagnosis not present

## 2018-04-11 DIAGNOSIS — Z2821 Immunization not carried out because of patient refusal: Secondary | ICD-10-CM | POA: Diagnosis not present

## 2018-04-11 DIAGNOSIS — Z6832 Body mass index (BMI) 32.0-32.9, adult: Secondary | ICD-10-CM | POA: Diagnosis not present

## 2018-05-16 DIAGNOSIS — E1165 Type 2 diabetes mellitus with hyperglycemia: Secondary | ICD-10-CM | POA: Diagnosis not present

## 2018-05-16 DIAGNOSIS — Z6832 Body mass index (BMI) 32.0-32.9, adult: Secondary | ICD-10-CM | POA: Diagnosis not present

## 2018-05-16 DIAGNOSIS — Z299 Encounter for prophylactic measures, unspecified: Secondary | ICD-10-CM | POA: Diagnosis not present

## 2018-05-16 DIAGNOSIS — D649 Anemia, unspecified: Secondary | ICD-10-CM | POA: Diagnosis not present

## 2018-05-16 DIAGNOSIS — I10 Essential (primary) hypertension: Secondary | ICD-10-CM | POA: Diagnosis not present

## 2018-05-16 DIAGNOSIS — I340 Nonrheumatic mitral (valve) insufficiency: Secondary | ICD-10-CM | POA: Diagnosis not present

## 2018-06-06 DIAGNOSIS — Z299 Encounter for prophylactic measures, unspecified: Secondary | ICD-10-CM | POA: Diagnosis not present

## 2018-06-06 DIAGNOSIS — I10 Essential (primary) hypertension: Secondary | ICD-10-CM | POA: Diagnosis not present

## 2018-06-06 DIAGNOSIS — Z6832 Body mass index (BMI) 32.0-32.9, adult: Secondary | ICD-10-CM | POA: Diagnosis not present

## 2018-06-06 DIAGNOSIS — K5900 Constipation, unspecified: Secondary | ICD-10-CM | POA: Diagnosis not present

## 2018-06-06 DIAGNOSIS — E1165 Type 2 diabetes mellitus with hyperglycemia: Secondary | ICD-10-CM | POA: Diagnosis not present

## 2018-06-16 DIAGNOSIS — Z6832 Body mass index (BMI) 32.0-32.9, adult: Secondary | ICD-10-CM | POA: Diagnosis not present

## 2018-06-16 DIAGNOSIS — I340 Nonrheumatic mitral (valve) insufficiency: Secondary | ICD-10-CM | POA: Diagnosis not present

## 2018-06-16 DIAGNOSIS — I10 Essential (primary) hypertension: Secondary | ICD-10-CM | POA: Diagnosis not present

## 2018-06-16 DIAGNOSIS — E1165 Type 2 diabetes mellitus with hyperglycemia: Secondary | ICD-10-CM | POA: Diagnosis not present

## 2018-06-16 DIAGNOSIS — Z299 Encounter for prophylactic measures, unspecified: Secondary | ICD-10-CM | POA: Diagnosis not present

## 2018-06-16 DIAGNOSIS — I2510 Atherosclerotic heart disease of native coronary artery without angina pectoris: Secondary | ICD-10-CM | POA: Diagnosis not present

## 2018-06-29 DIAGNOSIS — H905 Unspecified sensorineural hearing loss: Secondary | ICD-10-CM | POA: Diagnosis not present

## 2018-07-12 DIAGNOSIS — H905 Unspecified sensorineural hearing loss: Secondary | ICD-10-CM | POA: Diagnosis not present

## 2018-07-18 DIAGNOSIS — D649 Anemia, unspecified: Secondary | ICD-10-CM | POA: Diagnosis not present

## 2018-09-06 DIAGNOSIS — I340 Nonrheumatic mitral (valve) insufficiency: Secondary | ICD-10-CM | POA: Diagnosis not present

## 2018-09-06 DIAGNOSIS — Z299 Encounter for prophylactic measures, unspecified: Secondary | ICD-10-CM | POA: Diagnosis not present

## 2018-09-06 DIAGNOSIS — I10 Essential (primary) hypertension: Secondary | ICD-10-CM | POA: Diagnosis not present

## 2018-09-06 DIAGNOSIS — I2510 Atherosclerotic heart disease of native coronary artery without angina pectoris: Secondary | ICD-10-CM | POA: Diagnosis not present

## 2018-09-06 DIAGNOSIS — Z6832 Body mass index (BMI) 32.0-32.9, adult: Secondary | ICD-10-CM | POA: Diagnosis not present

## 2018-09-06 DIAGNOSIS — E1165 Type 2 diabetes mellitus with hyperglycemia: Secondary | ICD-10-CM | POA: Diagnosis not present

## 2018-09-06 DIAGNOSIS — D649 Anemia, unspecified: Secondary | ICD-10-CM | POA: Diagnosis not present

## 2018-09-30 DIAGNOSIS — I10 Essential (primary) hypertension: Secondary | ICD-10-CM | POA: Diagnosis not present

## 2018-10-18 DIAGNOSIS — I10 Essential (primary) hypertension: Secondary | ICD-10-CM | POA: Diagnosis not present

## 2018-11-22 DIAGNOSIS — D509 Iron deficiency anemia, unspecified: Secondary | ICD-10-CM | POA: Diagnosis not present

## 2018-11-22 DIAGNOSIS — Z1211 Encounter for screening for malignant neoplasm of colon: Secondary | ICD-10-CM | POA: Diagnosis not present

## 2018-11-22 DIAGNOSIS — Z125 Encounter for screening for malignant neoplasm of prostate: Secondary | ICD-10-CM | POA: Diagnosis not present

## 2018-11-22 DIAGNOSIS — Z1331 Encounter for screening for depression: Secondary | ICD-10-CM | POA: Diagnosis not present

## 2018-11-22 DIAGNOSIS — E1165 Type 2 diabetes mellitus with hyperglycemia: Secondary | ICD-10-CM | POA: Diagnosis not present

## 2018-11-22 DIAGNOSIS — Z6832 Body mass index (BMI) 32.0-32.9, adult: Secondary | ICD-10-CM | POA: Diagnosis not present

## 2018-11-22 DIAGNOSIS — E7800 Pure hypercholesterolemia, unspecified: Secondary | ICD-10-CM | POA: Diagnosis not present

## 2018-11-22 DIAGNOSIS — I10 Essential (primary) hypertension: Secondary | ICD-10-CM | POA: Diagnosis not present

## 2018-11-22 DIAGNOSIS — Z1339 Encounter for screening examination for other mental health and behavioral disorders: Secondary | ICD-10-CM | POA: Diagnosis not present

## 2018-11-22 DIAGNOSIS — Z7189 Other specified counseling: Secondary | ICD-10-CM | POA: Diagnosis not present

## 2018-11-22 DIAGNOSIS — F419 Anxiety disorder, unspecified: Secondary | ICD-10-CM | POA: Diagnosis not present

## 2018-11-22 DIAGNOSIS — Z0000 Encounter for general adult medical examination without abnormal findings: Secondary | ICD-10-CM | POA: Diagnosis not present

## 2018-11-22 DIAGNOSIS — Z79899 Other long term (current) drug therapy: Secondary | ICD-10-CM | POA: Diagnosis not present

## 2018-11-22 DIAGNOSIS — Z299 Encounter for prophylactic measures, unspecified: Secondary | ICD-10-CM | POA: Diagnosis not present

## 2018-12-01 DIAGNOSIS — I10 Essential (primary) hypertension: Secondary | ICD-10-CM | POA: Diagnosis not present

## 2018-12-14 DIAGNOSIS — Z299 Encounter for prophylactic measures, unspecified: Secondary | ICD-10-CM | POA: Diagnosis not present

## 2018-12-14 DIAGNOSIS — N400 Enlarged prostate without lower urinary tract symptoms: Secondary | ICD-10-CM | POA: Diagnosis not present

## 2018-12-14 DIAGNOSIS — I10 Essential (primary) hypertension: Secondary | ICD-10-CM | POA: Diagnosis not present

## 2018-12-14 DIAGNOSIS — E1165 Type 2 diabetes mellitus with hyperglycemia: Secondary | ICD-10-CM | POA: Diagnosis not present

## 2018-12-14 DIAGNOSIS — I2510 Atherosclerotic heart disease of native coronary artery without angina pectoris: Secondary | ICD-10-CM | POA: Diagnosis not present

## 2018-12-14 DIAGNOSIS — Z6832 Body mass index (BMI) 32.0-32.9, adult: Secondary | ICD-10-CM | POA: Diagnosis not present

## 2018-12-29 DIAGNOSIS — I10 Essential (primary) hypertension: Secondary | ICD-10-CM | POA: Diagnosis not present

## 2019-01-02 DIAGNOSIS — E119 Type 2 diabetes mellitus without complications: Secondary | ICD-10-CM | POA: Diagnosis not present

## 2019-01-02 DIAGNOSIS — E7800 Pure hypercholesterolemia, unspecified: Secondary | ICD-10-CM | POA: Diagnosis not present

## 2019-01-02 DIAGNOSIS — F419 Anxiety disorder, unspecified: Secondary | ICD-10-CM | POA: Diagnosis not present

## 2019-01-02 DIAGNOSIS — I10 Essential (primary) hypertension: Secondary | ICD-10-CM | POA: Diagnosis not present

## 2019-01-26 DIAGNOSIS — I10 Essential (primary) hypertension: Secondary | ICD-10-CM | POA: Diagnosis not present

## 2019-01-30 DIAGNOSIS — D649 Anemia, unspecified: Secondary | ICD-10-CM | POA: Diagnosis not present

## 2019-02-08 DIAGNOSIS — Z6832 Body mass index (BMI) 32.0-32.9, adult: Secondary | ICD-10-CM | POA: Diagnosis not present

## 2019-02-08 DIAGNOSIS — E1165 Type 2 diabetes mellitus with hyperglycemia: Secondary | ICD-10-CM | POA: Diagnosis not present

## 2019-02-08 DIAGNOSIS — I10 Essential (primary) hypertension: Secondary | ICD-10-CM | POA: Diagnosis not present

## 2019-02-08 DIAGNOSIS — Z299 Encounter for prophylactic measures, unspecified: Secondary | ICD-10-CM | POA: Diagnosis not present

## 2019-02-08 DIAGNOSIS — E7800 Pure hypercholesterolemia, unspecified: Secondary | ICD-10-CM | POA: Diagnosis not present

## 2019-02-27 DIAGNOSIS — I10 Essential (primary) hypertension: Secondary | ICD-10-CM | POA: Diagnosis not present

## 2019-03-16 DIAGNOSIS — I10 Essential (primary) hypertension: Secondary | ICD-10-CM | POA: Diagnosis not present

## 2019-03-16 DIAGNOSIS — D509 Iron deficiency anemia, unspecified: Secondary | ICD-10-CM | POA: Diagnosis not present

## 2019-03-16 DIAGNOSIS — Z6832 Body mass index (BMI) 32.0-32.9, adult: Secondary | ICD-10-CM | POA: Diagnosis not present

## 2019-03-16 DIAGNOSIS — E1165 Type 2 diabetes mellitus with hyperglycemia: Secondary | ICD-10-CM | POA: Diagnosis not present

## 2019-03-16 DIAGNOSIS — F419 Anxiety disorder, unspecified: Secondary | ICD-10-CM | POA: Diagnosis not present

## 2019-03-16 DIAGNOSIS — R5383 Other fatigue: Secondary | ICD-10-CM | POA: Diagnosis not present

## 2019-03-16 DIAGNOSIS — Z79899 Other long term (current) drug therapy: Secondary | ICD-10-CM | POA: Diagnosis not present

## 2019-03-16 DIAGNOSIS — Z299 Encounter for prophylactic measures, unspecified: Secondary | ICD-10-CM | POA: Diagnosis not present

## 2019-03-27 DIAGNOSIS — B9681 Helicobacter pylori [H. pylori] as the cause of diseases classified elsewhere: Secondary | ICD-10-CM | POA: Insufficient documentation

## 2019-03-27 DIAGNOSIS — D638 Anemia in other chronic diseases classified elsewhere: Secondary | ICD-10-CM | POA: Insufficient documentation

## 2019-03-27 DIAGNOSIS — N183 Chronic kidney disease, stage 3 unspecified: Secondary | ICD-10-CM | POA: Diagnosis not present

## 2019-03-27 DIAGNOSIS — D649 Anemia, unspecified: Secondary | ICD-10-CM | POA: Diagnosis not present

## 2019-03-27 DIAGNOSIS — I10 Essential (primary) hypertension: Secondary | ICD-10-CM | POA: Diagnosis not present

## 2019-03-27 DIAGNOSIS — D126 Benign neoplasm of colon, unspecified: Secondary | ICD-10-CM | POA: Diagnosis not present

## 2019-03-27 DIAGNOSIS — K279 Peptic ulcer, site unspecified, unspecified as acute or chronic, without hemorrhage or perforation: Secondary | ICD-10-CM | POA: Diagnosis not present

## 2019-04-04 DIAGNOSIS — D500 Iron deficiency anemia secondary to blood loss (chronic): Secondary | ICD-10-CM | POA: Diagnosis not present

## 2019-04-05 DIAGNOSIS — D500 Iron deficiency anemia secondary to blood loss (chronic): Secondary | ICD-10-CM | POA: Diagnosis not present

## 2019-04-11 DIAGNOSIS — D500 Iron deficiency anemia secondary to blood loss (chronic): Secondary | ICD-10-CM | POA: Diagnosis not present

## 2019-04-19 DIAGNOSIS — D500 Iron deficiency anemia secondary to blood loss (chronic): Secondary | ICD-10-CM | POA: Diagnosis not present

## 2019-04-19 DIAGNOSIS — I10 Essential (primary) hypertension: Secondary | ICD-10-CM | POA: Diagnosis not present

## 2019-04-19 DIAGNOSIS — D126 Benign neoplasm of colon, unspecified: Secondary | ICD-10-CM | POA: Diagnosis not present

## 2019-04-19 DIAGNOSIS — N183 Chronic kidney disease, stage 3 unspecified: Secondary | ICD-10-CM | POA: Diagnosis not present

## 2019-05-24 DIAGNOSIS — N183 Chronic kidney disease, stage 3 unspecified: Secondary | ICD-10-CM | POA: Diagnosis not present

## 2019-05-24 DIAGNOSIS — I10 Essential (primary) hypertension: Secondary | ICD-10-CM | POA: Diagnosis not present

## 2019-05-24 DIAGNOSIS — D500 Iron deficiency anemia secondary to blood loss (chronic): Secondary | ICD-10-CM | POA: Diagnosis not present

## 2019-05-31 DIAGNOSIS — D638 Anemia in other chronic diseases classified elsewhere: Secondary | ICD-10-CM | POA: Diagnosis not present

## 2019-05-31 DIAGNOSIS — D631 Anemia in chronic kidney disease: Secondary | ICD-10-CM | POA: Diagnosis not present

## 2019-05-31 DIAGNOSIS — N189 Chronic kidney disease, unspecified: Secondary | ICD-10-CM | POA: Diagnosis not present

## 2019-06-02 DIAGNOSIS — I10 Essential (primary) hypertension: Secondary | ICD-10-CM | POA: Diagnosis not present

## 2019-06-08 DIAGNOSIS — F1320 Sedative, hypnotic or anxiolytic dependence, uncomplicated: Secondary | ICD-10-CM | POA: Diagnosis not present

## 2019-06-08 DIAGNOSIS — F419 Anxiety disorder, unspecified: Secondary | ICD-10-CM | POA: Diagnosis not present

## 2019-06-08 DIAGNOSIS — I10 Essential (primary) hypertension: Secondary | ICD-10-CM | POA: Diagnosis not present

## 2019-06-08 DIAGNOSIS — E1165 Type 2 diabetes mellitus with hyperglycemia: Secondary | ICD-10-CM | POA: Diagnosis not present

## 2019-06-08 DIAGNOSIS — Z6832 Body mass index (BMI) 32.0-32.9, adult: Secondary | ICD-10-CM | POA: Diagnosis not present

## 2019-06-08 DIAGNOSIS — Z299 Encounter for prophylactic measures, unspecified: Secondary | ICD-10-CM | POA: Diagnosis not present

## 2019-06-29 DIAGNOSIS — E1165 Type 2 diabetes mellitus with hyperglycemia: Secondary | ICD-10-CM | POA: Diagnosis not present

## 2019-06-29 DIAGNOSIS — Z713 Dietary counseling and surveillance: Secondary | ICD-10-CM | POA: Diagnosis not present

## 2019-06-29 DIAGNOSIS — I10 Essential (primary) hypertension: Secondary | ICD-10-CM | POA: Diagnosis not present

## 2019-06-29 DIAGNOSIS — Z6831 Body mass index (BMI) 31.0-31.9, adult: Secondary | ICD-10-CM | POA: Diagnosis not present

## 2019-06-29 DIAGNOSIS — Z299 Encounter for prophylactic measures, unspecified: Secondary | ICD-10-CM | POA: Diagnosis not present

## 2019-08-01 DIAGNOSIS — I10 Essential (primary) hypertension: Secondary | ICD-10-CM | POA: Diagnosis not present

## 2019-09-01 DIAGNOSIS — I10 Essential (primary) hypertension: Secondary | ICD-10-CM | POA: Diagnosis not present

## 2019-10-01 DIAGNOSIS — I10 Essential (primary) hypertension: Secondary | ICD-10-CM | POA: Diagnosis not present

## 2019-10-04 DIAGNOSIS — E1165 Type 2 diabetes mellitus with hyperglycemia: Secondary | ICD-10-CM | POA: Diagnosis not present

## 2019-10-04 DIAGNOSIS — I10 Essential (primary) hypertension: Secondary | ICD-10-CM | POA: Diagnosis not present

## 2019-10-04 DIAGNOSIS — Z6831 Body mass index (BMI) 31.0-31.9, adult: Secondary | ICD-10-CM | POA: Diagnosis not present

## 2019-10-04 DIAGNOSIS — F1320 Sedative, hypnotic or anxiolytic dependence, uncomplicated: Secondary | ICD-10-CM | POA: Diagnosis not present

## 2019-10-04 DIAGNOSIS — E7800 Pure hypercholesterolemia, unspecified: Secondary | ICD-10-CM | POA: Diagnosis not present

## 2019-10-04 DIAGNOSIS — E1140 Type 2 diabetes mellitus with diabetic neuropathy, unspecified: Secondary | ICD-10-CM | POA: Diagnosis not present

## 2019-10-04 DIAGNOSIS — D649 Anemia, unspecified: Secondary | ICD-10-CM | POA: Diagnosis not present

## 2019-10-04 DIAGNOSIS — Z299 Encounter for prophylactic measures, unspecified: Secondary | ICD-10-CM | POA: Diagnosis not present

## 2019-10-10 DIAGNOSIS — E1159 Type 2 diabetes mellitus with other circulatory complications: Secondary | ICD-10-CM | POA: Diagnosis not present

## 2019-10-10 DIAGNOSIS — M79604 Pain in right leg: Secondary | ICD-10-CM | POA: Diagnosis not present

## 2019-10-10 DIAGNOSIS — M79605 Pain in left leg: Secondary | ICD-10-CM | POA: Diagnosis not present

## 2019-10-10 DIAGNOSIS — E1140 Type 2 diabetes mellitus with diabetic neuropathy, unspecified: Secondary | ICD-10-CM | POA: Diagnosis not present

## 2019-10-25 DIAGNOSIS — E1165 Type 2 diabetes mellitus with hyperglycemia: Secondary | ICD-10-CM | POA: Diagnosis not present

## 2019-10-25 DIAGNOSIS — Z299 Encounter for prophylactic measures, unspecified: Secondary | ICD-10-CM | POA: Diagnosis not present

## 2019-10-25 DIAGNOSIS — Z789 Other specified health status: Secondary | ICD-10-CM | POA: Diagnosis not present

## 2019-10-25 DIAGNOSIS — E1142 Type 2 diabetes mellitus with diabetic polyneuropathy: Secondary | ICD-10-CM | POA: Diagnosis not present

## 2019-10-25 DIAGNOSIS — I10 Essential (primary) hypertension: Secondary | ICD-10-CM | POA: Diagnosis not present

## 2019-10-25 DIAGNOSIS — E1122 Type 2 diabetes mellitus with diabetic chronic kidney disease: Secondary | ICD-10-CM | POA: Diagnosis not present

## 2019-10-25 DIAGNOSIS — N183 Chronic kidney disease, stage 3 unspecified: Secondary | ICD-10-CM | POA: Diagnosis not present

## 2019-11-01 DIAGNOSIS — I10 Essential (primary) hypertension: Secondary | ICD-10-CM | POA: Diagnosis not present

## 2019-12-01 DIAGNOSIS — I10 Essential (primary) hypertension: Secondary | ICD-10-CM | POA: Diagnosis not present

## 2019-12-05 DIAGNOSIS — E7800 Pure hypercholesterolemia, unspecified: Secondary | ICD-10-CM | POA: Diagnosis not present

## 2019-12-05 DIAGNOSIS — Z1331 Encounter for screening for depression: Secondary | ICD-10-CM | POA: Diagnosis not present

## 2019-12-05 DIAGNOSIS — Z125 Encounter for screening for malignant neoplasm of prostate: Secondary | ICD-10-CM | POA: Diagnosis not present

## 2019-12-05 DIAGNOSIS — I10 Essential (primary) hypertension: Secondary | ICD-10-CM | POA: Diagnosis not present

## 2019-12-05 DIAGNOSIS — Z7189 Other specified counseling: Secondary | ICD-10-CM | POA: Diagnosis not present

## 2019-12-05 DIAGNOSIS — Z1339 Encounter for screening examination for other mental health and behavioral disorders: Secondary | ICD-10-CM | POA: Diagnosis not present

## 2019-12-05 DIAGNOSIS — F419 Anxiety disorder, unspecified: Secondary | ICD-10-CM | POA: Diagnosis not present

## 2019-12-05 DIAGNOSIS — R5383 Other fatigue: Secondary | ICD-10-CM | POA: Diagnosis not present

## 2019-12-05 DIAGNOSIS — Z1211 Encounter for screening for malignant neoplasm of colon: Secondary | ICD-10-CM | POA: Diagnosis not present

## 2019-12-05 DIAGNOSIS — Z79899 Other long term (current) drug therapy: Secondary | ICD-10-CM | POA: Diagnosis not present

## 2019-12-05 DIAGNOSIS — Z299 Encounter for prophylactic measures, unspecified: Secondary | ICD-10-CM | POA: Diagnosis not present

## 2019-12-05 DIAGNOSIS — Z6830 Body mass index (BMI) 30.0-30.9, adult: Secondary | ICD-10-CM | POA: Diagnosis not present

## 2019-12-05 DIAGNOSIS — Z0000 Encounter for general adult medical examination without abnormal findings: Secondary | ICD-10-CM | POA: Diagnosis not present

## 2019-12-12 DIAGNOSIS — F419 Anxiety disorder, unspecified: Secondary | ICD-10-CM | POA: Diagnosis not present

## 2019-12-12 DIAGNOSIS — W57XXXA Bitten or stung by nonvenomous insect and other nonvenomous arthropods, initial encounter: Secondary | ICD-10-CM | POA: Diagnosis not present

## 2019-12-12 DIAGNOSIS — I10 Essential (primary) hypertension: Secondary | ICD-10-CM | POA: Diagnosis not present

## 2019-12-12 DIAGNOSIS — E1140 Type 2 diabetes mellitus with diabetic neuropathy, unspecified: Secondary | ICD-10-CM | POA: Diagnosis not present

## 2019-12-12 DIAGNOSIS — I700 Atherosclerosis of aorta: Secondary | ICD-10-CM | POA: Diagnosis not present

## 2019-12-12 DIAGNOSIS — Z299 Encounter for prophylactic measures, unspecified: Secondary | ICD-10-CM | POA: Diagnosis not present

## 2020-01-02 DIAGNOSIS — I10 Essential (primary) hypertension: Secondary | ICD-10-CM | POA: Diagnosis not present

## 2020-01-11 DIAGNOSIS — E1165 Type 2 diabetes mellitus with hyperglycemia: Secondary | ICD-10-CM | POA: Diagnosis not present

## 2020-01-11 DIAGNOSIS — I10 Essential (primary) hypertension: Secondary | ICD-10-CM | POA: Diagnosis not present

## 2020-01-11 DIAGNOSIS — N183 Chronic kidney disease, stage 3 unspecified: Secondary | ICD-10-CM | POA: Diagnosis not present

## 2020-01-11 DIAGNOSIS — Z299 Encounter for prophylactic measures, unspecified: Secondary | ICD-10-CM | POA: Diagnosis not present

## 2020-01-11 DIAGNOSIS — E1122 Type 2 diabetes mellitus with diabetic chronic kidney disease: Secondary | ICD-10-CM | POA: Diagnosis not present

## 2020-01-11 DIAGNOSIS — E1142 Type 2 diabetes mellitus with diabetic polyneuropathy: Secondary | ICD-10-CM | POA: Diagnosis not present

## 2020-02-01 DIAGNOSIS — I10 Essential (primary) hypertension: Secondary | ICD-10-CM | POA: Diagnosis not present

## 2020-03-02 DIAGNOSIS — I10 Essential (primary) hypertension: Secondary | ICD-10-CM | POA: Diagnosis not present

## 2020-03-07 DIAGNOSIS — R079 Chest pain, unspecified: Secondary | ICD-10-CM | POA: Diagnosis not present

## 2020-03-07 DIAGNOSIS — Z299 Encounter for prophylactic measures, unspecified: Secondary | ICD-10-CM | POA: Diagnosis not present

## 2020-03-07 DIAGNOSIS — I10 Essential (primary) hypertension: Secondary | ICD-10-CM | POA: Diagnosis not present

## 2020-04-02 DIAGNOSIS — I10 Essential (primary) hypertension: Secondary | ICD-10-CM | POA: Diagnosis not present

## 2020-04-22 DIAGNOSIS — N183 Chronic kidney disease, stage 3 unspecified: Secondary | ICD-10-CM | POA: Diagnosis not present

## 2020-04-22 DIAGNOSIS — I10 Essential (primary) hypertension: Secondary | ICD-10-CM | POA: Diagnosis not present

## 2020-04-22 DIAGNOSIS — E1122 Type 2 diabetes mellitus with diabetic chronic kidney disease: Secondary | ICD-10-CM | POA: Diagnosis not present

## 2020-04-22 DIAGNOSIS — Z299 Encounter for prophylactic measures, unspecified: Secondary | ICD-10-CM | POA: Diagnosis not present

## 2020-04-22 DIAGNOSIS — E1165 Type 2 diabetes mellitus with hyperglycemia: Secondary | ICD-10-CM | POA: Diagnosis not present

## 2020-04-22 DIAGNOSIS — M79604 Pain in right leg: Secondary | ICD-10-CM | POA: Diagnosis not present

## 2020-05-02 DIAGNOSIS — I10 Essential (primary) hypertension: Secondary | ICD-10-CM | POA: Diagnosis not present

## 2020-05-10 ENCOUNTER — Encounter: Payer: Self-pay | Admitting: *Deleted

## 2020-05-13 ENCOUNTER — Ambulatory Visit: Payer: Self-pay | Admitting: Cardiology

## 2020-05-21 DIAGNOSIS — Z299 Encounter for prophylactic measures, unspecified: Secondary | ICD-10-CM | POA: Diagnosis not present

## 2020-05-21 DIAGNOSIS — U071 COVID-19: Secondary | ICD-10-CM | POA: Diagnosis not present

## 2020-05-21 DIAGNOSIS — J3489 Other specified disorders of nose and nasal sinuses: Secondary | ICD-10-CM | POA: Diagnosis not present

## 2020-05-21 DIAGNOSIS — F17210 Nicotine dependence, cigarettes, uncomplicated: Secondary | ICD-10-CM | POA: Diagnosis not present

## 2020-05-21 DIAGNOSIS — I10 Essential (primary) hypertension: Secondary | ICD-10-CM | POA: Diagnosis not present

## 2020-05-21 DIAGNOSIS — E1165 Type 2 diabetes mellitus with hyperglycemia: Secondary | ICD-10-CM | POA: Diagnosis not present

## 2020-06-03 DIAGNOSIS — B001 Herpesviral vesicular dermatitis: Secondary | ICD-10-CM | POA: Diagnosis not present

## 2020-06-03 DIAGNOSIS — U071 COVID-19: Secondary | ICD-10-CM | POA: Diagnosis not present

## 2020-06-03 DIAGNOSIS — I10 Essential (primary) hypertension: Secondary | ICD-10-CM | POA: Diagnosis not present

## 2020-06-03 DIAGNOSIS — F419 Anxiety disorder, unspecified: Secondary | ICD-10-CM | POA: Diagnosis not present

## 2020-06-03 DIAGNOSIS — Z299 Encounter for prophylactic measures, unspecified: Secondary | ICD-10-CM | POA: Diagnosis not present

## 2020-06-17 ENCOUNTER — Encounter: Payer: Self-pay | Admitting: Cardiology

## 2020-06-17 NOTE — Progress Notes (Signed)
Cardiology Office Note  Date: 06/18/2020   ID: Eberardo, Demello 03/18/1941, MRN [id number]  PCP:  [name], MD  Cardiologist:  Rozann Lesches, MD Electrophysiologist:  None   Chief Complaint  Patient presents with  . Chest Pain    History of Present Illness: THELBERT GARTIN is an 80 y.o. male referred for cardiology consultation by Dr. Woody Seller for the evaluation of atypical chest pain.  He was last seen in our office back in 2014. He is here today with his wife. The episode of chest discomfort he describes occurred after he fell one day while using a walking stick, the walking stick struck him in the chest and he was sore subsequent to that. Symptoms not at all similar to prior angina. This has completely resolved.  I reviewed his cardiac history, detailed below. He did have an ECG done at [facility] which is reviewed below, similar to previous tracings.  I reviewed his medications which are fairly well-rounded from a cardiac perspective. He reports compliance with therapy. Blood pressure and heart rate are well controlled today. His last LDL was 78 in Aug 2021.  Remote echocardiogram from 2014 indicated mildly stenotic aortic valve, mild mitral regurgitation. We discussed obtaining a follow-up study.  Past Medical History:  Diagnosis Date  . Anxiety   . Arthritis   . Asthma   . Chronic pain   . CKD (chronic kidney disease) stage 3, GFR 30-59 ml/min (HCC)   . Coronary atherosclerosis of native coronary artery    Multvessel, DES to LAD and RCA 8/03; EF 65% by echo 11/2014  . DM2 (diabetes mellitus, type 2) ([place])   . Essential hypertension   . Gastritis    Related to NSAIDs  . GERD (gastroesophageal reflux disease)   . Iron deficiency anemia   . Mitral valve regurgitation   . Mixed hyperlipidemia   . Nephrolithiasis     Past Surgical History:  Procedure Laterality Date  . APPENDECTOMY    . CARDIAC CATHETERIZATION  01/2002   90% obstruction in  proximal LAD, 60 % obstruction in proximal circumflex and 70%  in proximal RCA. LAD & RCA stented  . L knee replacement     Subsequent infectino requiring resectino arthroplasty with incision and drainage 8/10, and reimplantizathion arthroplasty, 9/20    Current Outpatient Medications  Medication Sig Dispense Refill  . aspirin 81 MG tablet Take 81 mg by mouth daily.    . Carvedilol (COREG PO) Take by mouth in the morning and at bedtime.    . chlorthalidone (HYGROTON) 25 MG tablet Take 25 mg by mouth daily.    Marland Kitchen glipiZIDE (GLUCOTROL XL) 5 MG 24 hr tablet Take 10 mg by mouth daily. 2 tabs every morning; and 1 tab at bedtime    . IRON PO Take 1 tablet by mouth daily.    Marland Kitchen LORazepam (ATIVAN) 1 MG tablet Take 1 mg by mouth at bedtime.    . metFORMIN (GLUMETZA) 500 MG (MOD) 24 hr tablet Take 500 mg by mouth 2 (two) times daily.    . nitroGLYCERIN (NITROSTAT) 0.4 MG SL tablet Place 0.4 mg under the tongue every 5 (five) minutes as needed.    . pioglitazone (ACTOS) 45 MG tablet Take 45 mg by mouth daily.    . ramipril (ALTACE) 10 MG tablet Take 10 mg by mouth 2 (two) times daily.    . rosuvastatin (CRESTOR) 20 MG tablet Take 20 mg by mouth daily.    Marland Kitchen  tiZANidine (ZANAFLEX) 2 MG tablet      No current facility-administered medications for this visit.   Allergies:  Prednisone   Social History: The patient  reports that he has quit smoking. His smoking use included cigarettes. His smokeless tobacco use includes chew. He reports that he does not drink alcohol and does not use drugs.   Family History: The patient's family history includes Aneurysm in his mother; Diabetes in his mother; Stroke in his father and another family member.   ROS: Unsteady, uses walking sticks and also a walker.  Physical Exam: VS:  BP (!) 122/58   Pulse 68   Ht 6\' 1"  (1.854 m)   Wt 207 lb (93.9 kg)   SpO2 98%   BMI 27.31 kg/m , BMI Body mass index is 27.31 kg/m.  Wt Readings from Last 3 Encounters:  06/18/20 207  lb (93.9 kg)  02/20/16 240 lb (108.9 kg)  06/24/12 234 lb (106.1 kg)    General: Elderly male, appears comfortable at rest. Using a walker today. HEENT: Conjunctiva and lids normal, wearing a mask. Neck: Supple, no elevated JVP or carotid bruits, no thyromegaly. Lungs: Clear to auscultation, nonlabored breathing at rest. Cardiac: Regular rate and rhythm, no S3, 9-4/5 basal systolic murmur, no pericardial rub. Abdomen: Soft, nontender, bowel sounds present. Extremities: Mild ankle edema, right greater than left, distal pulses 2+. Skin: Warm and dry. Musculoskeletal: Kyphosis noted. Neuropsychiatric: Alert and oriented x3, affect grossly appropriate. Hearing loss evident.  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  Rule out old anterior infarct pattern.  Recent Labwork:  August 2021: Hemoglobin 10.3, platelets 234, TSH 2.29, cholesterol 138, triglycerides 136, HDL 36, LDL 78, BUN 49, creatinine 1.61, potassium 5.0, AST 22, ALT 15 January 2020: Hemoglobin A1c 7.3%  Other Studies Reviewed Today:  Echocardiogram 05/09/2012 [facility]): Possible mild LV enlargement with LVEF 55 to 60%, normal RV contraction, mild left atrial enlargement, mildly to moderately thickened mitral valve with mild mitral regurgitation, minimally stenotic aortic valve, trace tricuspid regurgitation, no pericardial effusion.  Assessment and Plan:  1. Transient atypical chest pain as described above, musculoskeletal in etiology and resolved. ECG chronically abnormal.  2. Multivessel CAD status post DES to the LAD and RCA in 2003. Reports no clear-cut angina at this time on medical therapy. Plan to continue medical therapy and observation at this point. He is on aspirin, Coreg, chlorthalidone, Altace, and Crestor.  3. Murmur and history of mild aortic stenosis as of 2014. Follow-up  echocardiogram will be obtained to assess progression.  4. Mixed hyperlipidemia, on Crestor. Last LDL 78.  Medication Adjustments/Labs and Tests Ordered: Current medicines are reviewed at length with the patient today.  Concerns regarding medicines are outlined above.   Tests Ordered: Orders Placed This Encounter  Procedures  . ECHOCARDIOGRAM COMPLETE    Medication Changes: No orders of the defined types were placed in this encounter.   Disposition:  Follow up 6 months in the [place] office.  Signed, Satira Sark, MD, [facility], [facility], [facility] 06/18/2020 9:30 AM    [place] [facility] [place], [place], [place] [address] Phone: [phone]; Fax: [phone]

## 2020-06-18 ENCOUNTER — Encounter: Payer: Self-pay | Admitting: Cardiology

## 2020-06-18 ENCOUNTER — Ambulatory Visit: Payer: PPO | Admitting: Cardiology

## 2020-06-18 VITALS — BP 122/58 | HR 68 | Ht 73.0 in | Wt 207.0 lb

## 2020-06-18 DIAGNOSIS — I25119 Atherosclerotic heart disease of native coronary artery with unspecified angina pectoris: Secondary | ICD-10-CM

## 2020-06-18 DIAGNOSIS — R011 Cardiac murmur, unspecified: Secondary | ICD-10-CM

## 2020-06-18 DIAGNOSIS — E782 Mixed hyperlipidemia: Secondary | ICD-10-CM

## 2020-06-18 NOTE — Patient Instructions (Addendum)

## 2020-07-01 DIAGNOSIS — I10 Essential (primary) hypertension: Secondary | ICD-10-CM | POA: Diagnosis not present

## 2020-07-09 ENCOUNTER — Other Ambulatory Visit: Payer: Self-pay

## 2020-07-09 ENCOUNTER — Ambulatory Visit (INDEPENDENT_AMBULATORY_CARE_PROVIDER_SITE_OTHER): Payer: PPO

## 2020-07-09 DIAGNOSIS — R011 Cardiac murmur, unspecified: Secondary | ICD-10-CM

## 2020-07-09 DIAGNOSIS — I25119 Atherosclerotic heart disease of native coronary artery with unspecified angina pectoris: Secondary | ICD-10-CM

## 2020-07-09 LAB — ECHOCARDIOGRAM COMPLETE
AR max vel: 1.56 cm2
AV Area VTI: 1.69 cm2
AV Area mean vel: 1.59 cm2
AV Mean grad: 13.4 mmHg
AV Peak grad: 26.1 mmHg
Ao pk vel: 2.55 m/s
Area-P 1/2: 2.79 cm2
Calc EF: 63.5 %
MV M vel: 1.33 m/s
MV Peak grad: 7.1 mmHg
S' Lateral: 2.58 cm
Single Plane A2C EF: 56.7 %
Single Plane A4C EF: 69.2 %

## 2020-07-11 ENCOUNTER — Telehealth: Payer: Self-pay | Admitting: *Deleted

## 2020-07-11 NOTE — Telephone Encounter (Signed)
Patient informed. Copy sent to PCP °

## 2020-07-11 NOTE — Telephone Encounter (Signed)
-----   Message from Satira Sark, MD sent at 07/09/2020  5:57 PM EST ----- Results reviewed.  The EF remains normal at 60 to 65%.  Aortic valve calcified and only mildly stenotic.  Continue with current medications and follow-up plan as scheduled.

## 2020-07-29 DIAGNOSIS — E1122 Type 2 diabetes mellitus with diabetic chronic kidney disease: Secondary | ICD-10-CM | POA: Diagnosis not present

## 2020-07-29 DIAGNOSIS — D649 Anemia, unspecified: Secondary | ICD-10-CM | POA: Diagnosis not present

## 2020-07-29 DIAGNOSIS — E1142 Type 2 diabetes mellitus with diabetic polyneuropathy: Secondary | ICD-10-CM | POA: Diagnosis not present

## 2020-07-29 DIAGNOSIS — I10 Essential (primary) hypertension: Secondary | ICD-10-CM | POA: Diagnosis not present

## 2020-07-29 DIAGNOSIS — Z299 Encounter for prophylactic measures, unspecified: Secondary | ICD-10-CM | POA: Diagnosis not present

## 2020-07-29 DIAGNOSIS — M25551 Pain in right hip: Secondary | ICD-10-CM | POA: Diagnosis not present

## 2020-07-29 DIAGNOSIS — E1165 Type 2 diabetes mellitus with hyperglycemia: Secondary | ICD-10-CM | POA: Diagnosis not present

## 2020-08-01 DIAGNOSIS — I10 Essential (primary) hypertension: Secondary | ICD-10-CM | POA: Diagnosis not present

## 2020-08-02 DIAGNOSIS — M25561 Pain in right knee: Secondary | ICD-10-CM | POA: Diagnosis not present

## 2020-08-02 DIAGNOSIS — M25551 Pain in right hip: Secondary | ICD-10-CM | POA: Diagnosis not present

## 2020-08-05 DIAGNOSIS — Z299 Encounter for prophylactic measures, unspecified: Secondary | ICD-10-CM | POA: Diagnosis not present

## 2020-08-05 DIAGNOSIS — I10 Essential (primary) hypertension: Secondary | ICD-10-CM | POA: Diagnosis not present

## 2020-08-05 DIAGNOSIS — H6123 Impacted cerumen, bilateral: Secondary | ICD-10-CM | POA: Diagnosis not present

## 2020-08-30 DIAGNOSIS — I10 Essential (primary) hypertension: Secondary | ICD-10-CM | POA: Diagnosis not present

## 2020-09-09 DIAGNOSIS — E1165 Type 2 diabetes mellitus with hyperglycemia: Secondary | ICD-10-CM | POA: Diagnosis not present

## 2020-09-09 DIAGNOSIS — I10 Essential (primary) hypertension: Secondary | ICD-10-CM | POA: Diagnosis not present

## 2020-09-09 DIAGNOSIS — F1320 Sedative, hypnotic or anxiolytic dependence, uncomplicated: Secondary | ICD-10-CM | POA: Diagnosis not present

## 2020-09-09 DIAGNOSIS — Z299 Encounter for prophylactic measures, unspecified: Secondary | ICD-10-CM | POA: Diagnosis not present

## 2020-09-09 DIAGNOSIS — I25119 Atherosclerotic heart disease of native coronary artery with unspecified angina pectoris: Secondary | ICD-10-CM | POA: Diagnosis not present

## 2020-09-09 DIAGNOSIS — D509 Iron deficiency anemia, unspecified: Secondary | ICD-10-CM | POA: Diagnosis not present

## 2020-10-01 DIAGNOSIS — M2351 Chronic instability of knee, right knee: Secondary | ICD-10-CM | POA: Diagnosis not present

## 2020-10-01 DIAGNOSIS — I10 Essential (primary) hypertension: Secondary | ICD-10-CM | POA: Diagnosis not present

## 2020-10-31 DIAGNOSIS — I10 Essential (primary) hypertension: Secondary | ICD-10-CM | POA: Diagnosis not present

## 2020-11-18 DIAGNOSIS — D509 Iron deficiency anemia, unspecified: Secondary | ICD-10-CM | POA: Diagnosis not present

## 2020-11-18 DIAGNOSIS — Z299 Encounter for prophylactic measures, unspecified: Secondary | ICD-10-CM | POA: Diagnosis not present

## 2020-11-18 DIAGNOSIS — I10 Essential (primary) hypertension: Secondary | ICD-10-CM | POA: Diagnosis not present

## 2020-11-18 DIAGNOSIS — F419 Anxiety disorder, unspecified: Secondary | ICD-10-CM | POA: Diagnosis not present

## 2020-11-18 DIAGNOSIS — E1165 Type 2 diabetes mellitus with hyperglycemia: Secondary | ICD-10-CM | POA: Diagnosis not present

## 2020-11-29 DIAGNOSIS — I10 Essential (primary) hypertension: Secondary | ICD-10-CM | POA: Diagnosis not present

## 2020-12-04 DIAGNOSIS — Z299 Encounter for prophylactic measures, unspecified: Secondary | ICD-10-CM | POA: Diagnosis not present

## 2020-12-04 DIAGNOSIS — E7800 Pure hypercholesterolemia, unspecified: Secondary | ICD-10-CM | POA: Diagnosis not present

## 2020-12-04 DIAGNOSIS — Z6829 Body mass index (BMI) 29.0-29.9, adult: Secondary | ICD-10-CM | POA: Diagnosis not present

## 2020-12-04 DIAGNOSIS — Z125 Encounter for screening for malignant neoplasm of prostate: Secondary | ICD-10-CM | POA: Diagnosis not present

## 2020-12-04 DIAGNOSIS — Z1339 Encounter for screening examination for other mental health and behavioral disorders: Secondary | ICD-10-CM | POA: Diagnosis not present

## 2020-12-04 DIAGNOSIS — Z1331 Encounter for screening for depression: Secondary | ICD-10-CM | POA: Diagnosis not present

## 2020-12-04 DIAGNOSIS — Z0000 Encounter for general adult medical examination without abnormal findings: Secondary | ICD-10-CM | POA: Diagnosis not present

## 2020-12-04 DIAGNOSIS — R5383 Other fatigue: Secondary | ICD-10-CM | POA: Diagnosis not present

## 2020-12-04 DIAGNOSIS — Z79899 Other long term (current) drug therapy: Secondary | ICD-10-CM | POA: Diagnosis not present

## 2020-12-04 DIAGNOSIS — Z7189 Other specified counseling: Secondary | ICD-10-CM | POA: Diagnosis not present

## 2020-12-25 ENCOUNTER — Encounter: Payer: Self-pay | Admitting: Cardiology

## 2020-12-25 ENCOUNTER — Ambulatory Visit: Payer: PPO | Admitting: Cardiology

## 2020-12-25 VITALS — BP 110/60 | HR 88 | Ht 73.0 in | Wt 206.2 lb

## 2020-12-25 DIAGNOSIS — I25119 Atherosclerotic heart disease of native coronary artery with unspecified angina pectoris: Secondary | ICD-10-CM

## 2020-12-25 DIAGNOSIS — R0989 Other specified symptoms and signs involving the circulatory and respiratory systems: Secondary | ICD-10-CM

## 2020-12-25 DIAGNOSIS — I350 Nonrheumatic aortic (valve) stenosis: Secondary | ICD-10-CM | POA: Diagnosis not present

## 2020-12-25 NOTE — Patient Instructions (Signed)
Medication Instructions:  Your physician recommends that you continue on your current medications as directed. Please refer to the Current Medication list given to you today.  Labwork: none  Testing/Procedures: Your physician has requested that you have a carotid duplex. This test is an ultrasound of the carotid arteries in your neck. It looks at blood flow through these arteries that supply the brain with blood. Allow one hour for this exam. There are no restrictions or special instructions.  Follow-Up: Your physician recommends that you schedule a follow-up appointment in: 6 months  Any Other Special Instructions Will Be Listed Below (If Applicable).  If you need a refill on your cardiac medications before your next appointment, please call your pharmacy. 

## 2020-12-25 NOTE — Progress Notes (Signed)
Cardiology Office Note  Date: 12/25/2020   ID: Bradley, Sheppard 11/05/1940, MRN [id number]  PCP:  [name], MD  Cardiologist:  Rozann Lesches, MD Electrophysiologist:  None   Chief Complaint  Patient presents with   Cardiac follow-up    History of Present Illness: Bradley Sheppard is an 80 y.o. male last seen in February.  He is here today with his wife for a follow-up visit.  Since last encounter he has not noted any angina symptoms.  Has a lot of difficulty getting around due to chronic leg and hip pain.  He is using a walker today.  Echocardiogram in March revealed LVEF 60 to 65% with moderate LVH, severe left atrial enlargement, and mild calcific aortic stenosis.  We discussed this today.  I reviewed his medications which are stable from a cardiac perspective and outlined below.  Past Medical History:  Diagnosis Date   Anxiety    Arthritis    Asthma    Chronic pain    CKD (chronic kidney disease) stage 3, GFR 30-59 ml/min (HCC)    Coronary atherosclerosis of native coronary artery    Multvessel, DES to LAD and RCA 8/03; EF 65% by echo 11/2014   DM2 (diabetes mellitus, type 2) ([place])    Essential hypertension    Gastritis    Related to NSAIDs   GERD (gastroesophageal reflux disease)    Iron deficiency anemia    Mitral valve regurgitation    Mixed hyperlipidemia    Nephrolithiasis     Past Surgical History:  Procedure Laterality Date   APPENDECTOMY     CARDIAC CATHETERIZATION  01/2002   90% obstruction in proximal LAD, 60 % obstruction in proximal circumflex and 70%  in proximal RCA. LAD & RCA stented   L knee replacement     Subsequent infectino requiring resectino arthroplasty with incision and drainage 8/10, and reimplantizathion arthroplasty, 9/20    Current Outpatient Medications  Medication Sig Dispense Refill   aspirin 81 MG tablet Take 81 mg by mouth daily.     Carvedilol (COREG PO) Take by mouth in the morning and at bedtime.      carvedilol (COREG) 12.5 MG tablet Take 12.5 mg by mouth 2 (two) times daily.     chlorthalidone (HYGROTON) 25 MG tablet Take 25 mg by mouth daily.     glipiZIDE (GLUCOTROL XL) 10 MG 24 hr tablet Take 10 mg by mouth 2 (two) times daily.     IRON PO Take 1 tablet by mouth 2 (two) times daily.     LORazepam (ATIVAN) 1 MG tablet Take 1 mg by mouth at bedtime.     metFORMIN (GLUMETZA) 500 MG (MOD) 24 hr tablet Take 1,000 mg by mouth 2 (two) times daily.     nitroGLYCERIN (NITROSTAT) 0.4 MG SL tablet Place 0.4 mg under the tongue every 5 (five) minutes as needed.     pioglitazone (ACTOS) 45 MG tablet Take 45 mg by mouth daily.     ramipril (ALTACE) 10 MG tablet Take 10 mg by mouth 2 (two) times daily.     rosuvastatin (CRESTOR) 20 MG tablet Take 20 mg by mouth daily.     Semaglutide (OZEMPIC, 0.25 OR 0.5 MG/DOSE, [place]) Inject 0.25 mLs into the skin once a week.     tiZANidine (ZANAFLEX) 2 MG tablet      valsartan (DIOVAN) 320 MG tablet Take 320 mg by mouth daily.     No current facility-administered medications for this  visit.   Allergies:  Prednisone   ROS: No palpitations or syncope.  Physical Exam: VS:  BP 110/60   Pulse 88   Ht '6\' 1"'$  (1.854 m)   Wt 206 lb 3.2 oz (93.5 kg)   SpO2 98%   BMI 27.20 kg/m , BMI Body mass index is 27.2 kg/m.  Wt Readings from Last 3 Encounters:  12/25/20 206 lb 3.2 oz (93.5 kg)  06/18/20 207 lb (93.9 kg)  02/20/16 240 lb (108.9 kg)    General: Elderly male, appears comfortable at rest. HEENT: Conjunctiva and lids normal, wearing a mask. Neck: Supple, no elevated JVP, soft carotid bruits, no thyromegaly. Lungs: Clear to auscultation, nonlabored breathing at rest. Cardiac: Regular rate and rhythm, no S3, 2/6 systolic murmur, no pericardial rub. Extremities: No pitting edema.  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  Recent Labwork:  August 2021: Hemoglobin 10.3, platelets 234, TSH 2.29, cholesterol 138, triglycerides 136, HDL 36, LDL 78, BUN 49, creatinine 1.61, potassium 5.0, AST 22, ALT 15 January 2020: Hemoglobin A1c 7.3%  Other Studies Reviewed Today:  Echocardiogram 07/09/2020:  1. Left ventricular ejection fraction, by estimation, is 60 to 65%. The  left ventricle has normal function. The left ventricle has no regional  wall motion abnormalities. There is moderate left ventricular hypertrophy.  Left ventricular diastolic  parameters are indeterminate. Elevated left atrial pressure.   2. Right ventricular systolic function is normal. The right ventricular  size is normal.   3. Left atrial size was severely dilated.   4. Right atrial size was mild to moderately dilated.   5. The pericardial effusion is circumferential.   6. The mitral valve is normal in structure. No evidence of mitral valve  regurgitation. No evidence of mitral stenosis.   7. The aortic valve is tricuspid. There is mild calcification of the  aortic valve. There is mild thickening of the aortic valve. Aortic valve  regurgitation is not visualized. Mild aortic valve stenosis. Aortic valve  mean gradient measures 13.4 mmHg.  Aortic valve peak gradient measures 26.1 mmHg. Aortic valve area, by VTI  measures 1.69 cm.   8. The inferior vena cava is normal in size with greater than 50%  respiratory variability, suggesting right atrial pressure of 3 mmHg.   Assessment and Plan:  1.  Mild calcific aortic stenosis by recent echocardiogram with mean gradient 13 mmHg.  Asymptomatic.  Continue observation  2.  Multivessel CAD status post DES to the LAD and RCA in 2003.  No active angina at this time.  Continue medical therapy and observation.  He is on aspirin, Altace, Coreg, chlorthalidone and Crestor.  3.  Mixed hyperlipidemia, continues on Crestor.  4.  Carotid bruits, check carotid Dopplers.  Medication  Adjustments/Labs and Tests Ordered: Current medicines are reviewed at length with the patient today.  Concerns regarding medicines are outlined above.   Tests Ordered: Orders Placed This Encounter  Procedures   VAS US CAROTID     Medication Changes: No orders of the defined types were placed in this encounter.   Disposition:  Follow up  6 months.  Signed, Satira Sark, MD, [facility] 12/25/2020 3:54 PM    [place] [facility] [place], [place], [place] [address] Phone: [phone]; Fax: [phone]

## 2021-01-01 DIAGNOSIS — I10 Essential (primary) hypertension: Secondary | ICD-10-CM | POA: Diagnosis not present

## 2021-01-15 ENCOUNTER — Other Ambulatory Visit: Payer: Self-pay

## 2021-01-15 ENCOUNTER — Ambulatory Visit (INDEPENDENT_AMBULATORY_CARE_PROVIDER_SITE_OTHER): Payer: PPO

## 2021-01-15 DIAGNOSIS — R0989 Other specified symptoms and signs involving the circulatory and respiratory systems: Secondary | ICD-10-CM

## 2021-01-16 ENCOUNTER — Telehealth: Payer: Self-pay | Admitting: *Deleted

## 2021-01-16 NOTE — Telephone Encounter (Signed)
-----   Message from Satira Sark, MD sent at 01/16/2021  9:52 AM EDT ----- Results reviewed.  Mild bilateral ICA atherosclerosis.  Continue statin therapy.

## 2021-01-16 NOTE — Telephone Encounter (Signed)
Patient informed. Copy sent to PCP °

## 2021-01-31 DIAGNOSIS — I10 Essential (primary) hypertension: Secondary | ICD-10-CM | POA: Diagnosis not present

## 2021-02-24 DIAGNOSIS — Z2821 Immunization not carried out because of patient refusal: Secondary | ICD-10-CM | POA: Diagnosis not present

## 2021-02-24 DIAGNOSIS — M79604 Pain in right leg: Secondary | ICD-10-CM | POA: Diagnosis not present

## 2021-02-24 DIAGNOSIS — M79605 Pain in left leg: Secondary | ICD-10-CM | POA: Diagnosis not present

## 2021-02-24 DIAGNOSIS — I10 Essential (primary) hypertension: Secondary | ICD-10-CM | POA: Diagnosis not present

## 2021-02-24 DIAGNOSIS — E1165 Type 2 diabetes mellitus with hyperglycemia: Secondary | ICD-10-CM | POA: Diagnosis not present

## 2021-02-24 DIAGNOSIS — D649 Anemia, unspecified: Secondary | ICD-10-CM | POA: Diagnosis not present

## 2021-02-24 DIAGNOSIS — Z299 Encounter for prophylactic measures, unspecified: Secondary | ICD-10-CM | POA: Diagnosis not present

## 2021-03-03 DIAGNOSIS — I10 Essential (primary) hypertension: Secondary | ICD-10-CM | POA: Diagnosis not present

## 2021-04-02 DIAGNOSIS — I10 Essential (primary) hypertension: Secondary | ICD-10-CM | POA: Diagnosis not present

## 2021-05-02 DIAGNOSIS — I10 Essential (primary) hypertension: Secondary | ICD-10-CM | POA: Diagnosis not present

## 2021-05-26 DIAGNOSIS — Z79899 Other long term (current) drug therapy: Secondary | ICD-10-CM | POA: Diagnosis not present

## 2021-05-26 DIAGNOSIS — M179 Osteoarthritis of knee, unspecified: Secondary | ICD-10-CM | POA: Diagnosis not present

## 2021-05-26 DIAGNOSIS — R5383 Other fatigue: Secondary | ICD-10-CM | POA: Diagnosis not present

## 2021-05-26 DIAGNOSIS — I10 Essential (primary) hypertension: Secondary | ICD-10-CM | POA: Diagnosis not present

## 2021-05-26 DIAGNOSIS — Z299 Encounter for prophylactic measures, unspecified: Secondary | ICD-10-CM | POA: Diagnosis not present

## 2021-05-26 DIAGNOSIS — R296 Repeated falls: Secondary | ICD-10-CM | POA: Diagnosis not present

## 2021-05-30 DIAGNOSIS — L723 Sebaceous cyst: Secondary | ICD-10-CM | POA: Diagnosis not present

## 2021-05-30 DIAGNOSIS — E1142 Type 2 diabetes mellitus with diabetic polyneuropathy: Secondary | ICD-10-CM | POA: Diagnosis not present

## 2021-05-30 DIAGNOSIS — D509 Iron deficiency anemia, unspecified: Secondary | ICD-10-CM | POA: Diagnosis not present

## 2021-05-30 DIAGNOSIS — M6289 Other specified disorders of muscle: Secondary | ICD-10-CM | POA: Diagnosis not present

## 2021-05-30 DIAGNOSIS — F1320 Sedative, hypnotic or anxiolytic dependence, uncomplicated: Secondary | ICD-10-CM | POA: Diagnosis not present

## 2021-05-30 DIAGNOSIS — E1122 Type 2 diabetes mellitus with diabetic chronic kidney disease: Secondary | ICD-10-CM | POA: Diagnosis not present

## 2021-05-30 DIAGNOSIS — M169 Osteoarthritis of hip, unspecified: Secondary | ICD-10-CM | POA: Diagnosis not present

## 2021-05-30 DIAGNOSIS — I340 Nonrheumatic mitral (valve) insufficiency: Secondary | ICD-10-CM | POA: Diagnosis not present

## 2021-05-30 DIAGNOSIS — G4709 Other insomnia: Secondary | ICD-10-CM | POA: Diagnosis not present

## 2021-05-30 DIAGNOSIS — E7800 Pure hypercholesterolemia, unspecified: Secondary | ICD-10-CM | POA: Diagnosis not present

## 2021-05-30 DIAGNOSIS — R296 Repeated falls: Secondary | ICD-10-CM | POA: Diagnosis not present

## 2021-05-30 DIAGNOSIS — I2510 Atherosclerotic heart disease of native coronary artery without angina pectoris: Secondary | ICD-10-CM | POA: Diagnosis not present

## 2021-05-30 DIAGNOSIS — M25562 Pain in left knee: Secondary | ICD-10-CM | POA: Diagnosis not present

## 2021-05-30 DIAGNOSIS — J309 Allergic rhinitis, unspecified: Secondary | ICD-10-CM | POA: Diagnosis not present

## 2021-05-30 DIAGNOSIS — M1711 Unilateral primary osteoarthritis, right knee: Secondary | ICD-10-CM | POA: Diagnosis not present

## 2021-05-30 DIAGNOSIS — F419 Anxiety disorder, unspecified: Secondary | ICD-10-CM | POA: Diagnosis not present

## 2021-05-30 DIAGNOSIS — B353 Tinea pedis: Secondary | ICD-10-CM | POA: Diagnosis not present

## 2021-05-30 DIAGNOSIS — N400 Enlarged prostate without lower urinary tract symptoms: Secondary | ICD-10-CM | POA: Diagnosis not present

## 2021-05-30 DIAGNOSIS — M791 Myalgia, unspecified site: Secondary | ICD-10-CM | POA: Diagnosis not present

## 2021-05-30 DIAGNOSIS — G8929 Other chronic pain: Secondary | ICD-10-CM | POA: Diagnosis not present

## 2021-05-30 DIAGNOSIS — I700 Atherosclerosis of aorta: Secondary | ICD-10-CM | POA: Diagnosis not present

## 2021-05-30 DIAGNOSIS — H6120 Impacted cerumen, unspecified ear: Secondary | ICD-10-CM | POA: Diagnosis not present

## 2021-05-30 DIAGNOSIS — D631 Anemia in chronic kidney disease: Secondary | ICD-10-CM | POA: Diagnosis not present

## 2021-05-30 DIAGNOSIS — N189 Chronic kidney disease, unspecified: Secondary | ICD-10-CM | POA: Diagnosis not present

## 2021-05-30 DIAGNOSIS — I10 Essential (primary) hypertension: Secondary | ICD-10-CM | POA: Diagnosis not present

## 2021-06-01 DIAGNOSIS — I10 Essential (primary) hypertension: Secondary | ICD-10-CM | POA: Diagnosis not present

## 2021-06-03 DIAGNOSIS — F1320 Sedative, hypnotic or anxiolytic dependence, uncomplicated: Secondary | ICD-10-CM | POA: Diagnosis not present

## 2021-06-03 DIAGNOSIS — L723 Sebaceous cyst: Secondary | ICD-10-CM | POA: Diagnosis not present

## 2021-06-03 DIAGNOSIS — I2510 Atherosclerotic heart disease of native coronary artery without angina pectoris: Secondary | ICD-10-CM | POA: Diagnosis not present

## 2021-06-03 DIAGNOSIS — G8929 Other chronic pain: Secondary | ICD-10-CM | POA: Diagnosis not present

## 2021-06-03 DIAGNOSIS — E1122 Type 2 diabetes mellitus with diabetic chronic kidney disease: Secondary | ICD-10-CM | POA: Diagnosis not present

## 2021-06-03 DIAGNOSIS — M169 Osteoarthritis of hip, unspecified: Secondary | ICD-10-CM | POA: Diagnosis not present

## 2021-06-03 DIAGNOSIS — E7800 Pure hypercholesterolemia, unspecified: Secondary | ICD-10-CM | POA: Diagnosis not present

## 2021-06-03 DIAGNOSIS — E1142 Type 2 diabetes mellitus with diabetic polyneuropathy: Secondary | ICD-10-CM | POA: Diagnosis not present

## 2021-06-03 DIAGNOSIS — F419 Anxiety disorder, unspecified: Secondary | ICD-10-CM | POA: Diagnosis not present

## 2021-06-03 DIAGNOSIS — M791 Myalgia, unspecified site: Secondary | ICD-10-CM | POA: Diagnosis not present

## 2021-06-03 DIAGNOSIS — D509 Iron deficiency anemia, unspecified: Secondary | ICD-10-CM | POA: Diagnosis not present

## 2021-06-03 DIAGNOSIS — N400 Enlarged prostate without lower urinary tract symptoms: Secondary | ICD-10-CM | POA: Diagnosis not present

## 2021-06-03 DIAGNOSIS — I700 Atherosclerosis of aorta: Secondary | ICD-10-CM | POA: Diagnosis not present

## 2021-06-03 DIAGNOSIS — J309 Allergic rhinitis, unspecified: Secondary | ICD-10-CM | POA: Diagnosis not present

## 2021-06-03 DIAGNOSIS — G4709 Other insomnia: Secondary | ICD-10-CM | POA: Diagnosis not present

## 2021-06-03 DIAGNOSIS — N189 Chronic kidney disease, unspecified: Secondary | ICD-10-CM | POA: Diagnosis not present

## 2021-06-03 DIAGNOSIS — R296 Repeated falls: Secondary | ICD-10-CM | POA: Diagnosis not present

## 2021-06-03 DIAGNOSIS — I340 Nonrheumatic mitral (valve) insufficiency: Secondary | ICD-10-CM | POA: Diagnosis not present

## 2021-06-03 DIAGNOSIS — H6120 Impacted cerumen, unspecified ear: Secondary | ICD-10-CM | POA: Diagnosis not present

## 2021-06-03 DIAGNOSIS — M1711 Unilateral primary osteoarthritis, right knee: Secondary | ICD-10-CM | POA: Diagnosis not present

## 2021-06-03 DIAGNOSIS — B353 Tinea pedis: Secondary | ICD-10-CM | POA: Diagnosis not present

## 2021-06-03 DIAGNOSIS — M25562 Pain in left knee: Secondary | ICD-10-CM | POA: Diagnosis not present

## 2021-06-03 DIAGNOSIS — D631 Anemia in chronic kidney disease: Secondary | ICD-10-CM | POA: Diagnosis not present

## 2021-06-03 DIAGNOSIS — I10 Essential (primary) hypertension: Secondary | ICD-10-CM | POA: Diagnosis not present

## 2021-06-05 DIAGNOSIS — G8929 Other chronic pain: Secondary | ICD-10-CM | POA: Diagnosis not present

## 2021-06-05 DIAGNOSIS — N400 Enlarged prostate without lower urinary tract symptoms: Secondary | ICD-10-CM | POA: Diagnosis not present

## 2021-06-05 DIAGNOSIS — L723 Sebaceous cyst: Secondary | ICD-10-CM | POA: Diagnosis not present

## 2021-06-05 DIAGNOSIS — M791 Myalgia, unspecified site: Secondary | ICD-10-CM | POA: Diagnosis not present

## 2021-06-05 DIAGNOSIS — E1122 Type 2 diabetes mellitus with diabetic chronic kidney disease: Secondary | ICD-10-CM | POA: Diagnosis not present

## 2021-06-05 DIAGNOSIS — F419 Anxiety disorder, unspecified: Secondary | ICD-10-CM | POA: Diagnosis not present

## 2021-06-05 DIAGNOSIS — J309 Allergic rhinitis, unspecified: Secondary | ICD-10-CM | POA: Diagnosis not present

## 2021-06-05 DIAGNOSIS — M169 Osteoarthritis of hip, unspecified: Secondary | ICD-10-CM | POA: Diagnosis not present

## 2021-06-05 DIAGNOSIS — I2510 Atherosclerotic heart disease of native coronary artery without angina pectoris: Secondary | ICD-10-CM | POA: Diagnosis not present

## 2021-06-05 DIAGNOSIS — B353 Tinea pedis: Secondary | ICD-10-CM | POA: Diagnosis not present

## 2021-06-05 DIAGNOSIS — D631 Anemia in chronic kidney disease: Secondary | ICD-10-CM | POA: Diagnosis not present

## 2021-06-05 DIAGNOSIS — E1142 Type 2 diabetes mellitus with diabetic polyneuropathy: Secondary | ICD-10-CM | POA: Diagnosis not present

## 2021-06-05 DIAGNOSIS — E7800 Pure hypercholesterolemia, unspecified: Secondary | ICD-10-CM | POA: Diagnosis not present

## 2021-06-05 DIAGNOSIS — I340 Nonrheumatic mitral (valve) insufficiency: Secondary | ICD-10-CM | POA: Diagnosis not present

## 2021-06-05 DIAGNOSIS — I10 Essential (primary) hypertension: Secondary | ICD-10-CM | POA: Diagnosis not present

## 2021-06-05 DIAGNOSIS — D509 Iron deficiency anemia, unspecified: Secondary | ICD-10-CM | POA: Diagnosis not present

## 2021-06-05 DIAGNOSIS — R296 Repeated falls: Secondary | ICD-10-CM | POA: Diagnosis not present

## 2021-06-05 DIAGNOSIS — I700 Atherosclerosis of aorta: Secondary | ICD-10-CM | POA: Diagnosis not present

## 2021-06-05 DIAGNOSIS — N189 Chronic kidney disease, unspecified: Secondary | ICD-10-CM | POA: Diagnosis not present

## 2021-06-05 DIAGNOSIS — F1320 Sedative, hypnotic or anxiolytic dependence, uncomplicated: Secondary | ICD-10-CM | POA: Diagnosis not present

## 2021-06-05 DIAGNOSIS — M1711 Unilateral primary osteoarthritis, right knee: Secondary | ICD-10-CM | POA: Diagnosis not present

## 2021-06-05 DIAGNOSIS — H6120 Impacted cerumen, unspecified ear: Secondary | ICD-10-CM | POA: Diagnosis not present

## 2021-06-05 DIAGNOSIS — G4709 Other insomnia: Secondary | ICD-10-CM | POA: Diagnosis not present

## 2021-06-05 DIAGNOSIS — M25562 Pain in left knee: Secondary | ICD-10-CM | POA: Diagnosis not present

## 2021-06-06 DIAGNOSIS — I2510 Atherosclerotic heart disease of native coronary artery without angina pectoris: Secondary | ICD-10-CM | POA: Diagnosis not present

## 2021-06-06 DIAGNOSIS — M1711 Unilateral primary osteoarthritis, right knee: Secondary | ICD-10-CM | POA: Diagnosis not present

## 2021-06-06 DIAGNOSIS — F1320 Sedative, hypnotic or anxiolytic dependence, uncomplicated: Secondary | ICD-10-CM | POA: Diagnosis not present

## 2021-06-06 DIAGNOSIS — G4709 Other insomnia: Secondary | ICD-10-CM | POA: Diagnosis not present

## 2021-06-06 DIAGNOSIS — L723 Sebaceous cyst: Secondary | ICD-10-CM | POA: Diagnosis not present

## 2021-06-06 DIAGNOSIS — N189 Chronic kidney disease, unspecified: Secondary | ICD-10-CM | POA: Diagnosis not present

## 2021-06-06 DIAGNOSIS — I10 Essential (primary) hypertension: Secondary | ICD-10-CM | POA: Diagnosis not present

## 2021-06-06 DIAGNOSIS — D631 Anemia in chronic kidney disease: Secondary | ICD-10-CM | POA: Diagnosis not present

## 2021-06-06 DIAGNOSIS — I340 Nonrheumatic mitral (valve) insufficiency: Secondary | ICD-10-CM | POA: Diagnosis not present

## 2021-06-06 DIAGNOSIS — M791 Myalgia, unspecified site: Secondary | ICD-10-CM | POA: Diagnosis not present

## 2021-06-06 DIAGNOSIS — M169 Osteoarthritis of hip, unspecified: Secondary | ICD-10-CM | POA: Diagnosis not present

## 2021-06-06 DIAGNOSIS — M25562 Pain in left knee: Secondary | ICD-10-CM | POA: Diagnosis not present

## 2021-06-06 DIAGNOSIS — E7800 Pure hypercholesterolemia, unspecified: Secondary | ICD-10-CM | POA: Diagnosis not present

## 2021-06-06 DIAGNOSIS — N400 Enlarged prostate without lower urinary tract symptoms: Secondary | ICD-10-CM | POA: Diagnosis not present

## 2021-06-06 DIAGNOSIS — F419 Anxiety disorder, unspecified: Secondary | ICD-10-CM | POA: Diagnosis not present

## 2021-06-06 DIAGNOSIS — I700 Atherosclerosis of aorta: Secondary | ICD-10-CM | POA: Diagnosis not present

## 2021-06-06 DIAGNOSIS — J309 Allergic rhinitis, unspecified: Secondary | ICD-10-CM | POA: Diagnosis not present

## 2021-06-06 DIAGNOSIS — R296 Repeated falls: Secondary | ICD-10-CM | POA: Diagnosis not present

## 2021-06-06 DIAGNOSIS — G8929 Other chronic pain: Secondary | ICD-10-CM | POA: Diagnosis not present

## 2021-06-06 DIAGNOSIS — E1142 Type 2 diabetes mellitus with diabetic polyneuropathy: Secondary | ICD-10-CM | POA: Diagnosis not present

## 2021-06-06 DIAGNOSIS — E1122 Type 2 diabetes mellitus with diabetic chronic kidney disease: Secondary | ICD-10-CM | POA: Diagnosis not present

## 2021-06-06 DIAGNOSIS — H6120 Impacted cerumen, unspecified ear: Secondary | ICD-10-CM | POA: Diagnosis not present

## 2021-06-06 DIAGNOSIS — D509 Iron deficiency anemia, unspecified: Secondary | ICD-10-CM | POA: Diagnosis not present

## 2021-06-06 DIAGNOSIS — B353 Tinea pedis: Secondary | ICD-10-CM | POA: Diagnosis not present

## 2021-06-12 DIAGNOSIS — D649 Anemia, unspecified: Secondary | ICD-10-CM | POA: Diagnosis not present

## 2021-06-12 DIAGNOSIS — Z79899 Other long term (current) drug therapy: Secondary | ICD-10-CM | POA: Diagnosis not present

## 2021-06-17 DIAGNOSIS — B353 Tinea pedis: Secondary | ICD-10-CM | POA: Diagnosis not present

## 2021-06-17 DIAGNOSIS — D509 Iron deficiency anemia, unspecified: Secondary | ICD-10-CM | POA: Diagnosis not present

## 2021-06-17 DIAGNOSIS — M169 Osteoarthritis of hip, unspecified: Secondary | ICD-10-CM | POA: Diagnosis not present

## 2021-06-17 DIAGNOSIS — M791 Myalgia, unspecified site: Secondary | ICD-10-CM | POA: Diagnosis not present

## 2021-06-17 DIAGNOSIS — G4709 Other insomnia: Secondary | ICD-10-CM | POA: Diagnosis not present

## 2021-06-17 DIAGNOSIS — D631 Anemia in chronic kidney disease: Secondary | ICD-10-CM | POA: Diagnosis not present

## 2021-06-17 DIAGNOSIS — L723 Sebaceous cyst: Secondary | ICD-10-CM | POA: Diagnosis not present

## 2021-06-17 DIAGNOSIS — E7800 Pure hypercholesterolemia, unspecified: Secondary | ICD-10-CM | POA: Diagnosis not present

## 2021-06-17 DIAGNOSIS — N189 Chronic kidney disease, unspecified: Secondary | ICD-10-CM | POA: Diagnosis not present

## 2021-06-17 DIAGNOSIS — I10 Essential (primary) hypertension: Secondary | ICD-10-CM | POA: Diagnosis not present

## 2021-06-17 DIAGNOSIS — N400 Enlarged prostate without lower urinary tract symptoms: Secondary | ICD-10-CM | POA: Diagnosis not present

## 2021-06-17 DIAGNOSIS — J309 Allergic rhinitis, unspecified: Secondary | ICD-10-CM | POA: Diagnosis not present

## 2021-06-17 DIAGNOSIS — M1711 Unilateral primary osteoarthritis, right knee: Secondary | ICD-10-CM | POA: Diagnosis not present

## 2021-06-17 DIAGNOSIS — M25562 Pain in left knee: Secondary | ICD-10-CM | POA: Diagnosis not present

## 2021-06-17 DIAGNOSIS — E1122 Type 2 diabetes mellitus with diabetic chronic kidney disease: Secondary | ICD-10-CM | POA: Diagnosis not present

## 2021-06-17 DIAGNOSIS — F1320 Sedative, hypnotic or anxiolytic dependence, uncomplicated: Secondary | ICD-10-CM | POA: Diagnosis not present

## 2021-06-17 DIAGNOSIS — E1142 Type 2 diabetes mellitus with diabetic polyneuropathy: Secondary | ICD-10-CM | POA: Diagnosis not present

## 2021-06-17 DIAGNOSIS — H6120 Impacted cerumen, unspecified ear: Secondary | ICD-10-CM | POA: Diagnosis not present

## 2021-06-17 DIAGNOSIS — I700 Atherosclerosis of aorta: Secondary | ICD-10-CM | POA: Diagnosis not present

## 2021-06-17 DIAGNOSIS — I340 Nonrheumatic mitral (valve) insufficiency: Secondary | ICD-10-CM | POA: Diagnosis not present

## 2021-06-17 DIAGNOSIS — G8929 Other chronic pain: Secondary | ICD-10-CM | POA: Diagnosis not present

## 2021-06-17 DIAGNOSIS — F419 Anxiety disorder, unspecified: Secondary | ICD-10-CM | POA: Diagnosis not present

## 2021-06-17 DIAGNOSIS — R296 Repeated falls: Secondary | ICD-10-CM | POA: Diagnosis not present

## 2021-06-17 DIAGNOSIS — I2510 Atherosclerotic heart disease of native coronary artery without angina pectoris: Secondary | ICD-10-CM | POA: Diagnosis not present

## 2021-06-18 DIAGNOSIS — H6120 Impacted cerumen, unspecified ear: Secondary | ICD-10-CM | POA: Diagnosis not present

## 2021-06-18 DIAGNOSIS — D631 Anemia in chronic kidney disease: Secondary | ICD-10-CM | POA: Diagnosis not present

## 2021-06-18 DIAGNOSIS — I340 Nonrheumatic mitral (valve) insufficiency: Secondary | ICD-10-CM | POA: Diagnosis not present

## 2021-06-18 DIAGNOSIS — R296 Repeated falls: Secondary | ICD-10-CM | POA: Diagnosis not present

## 2021-06-18 DIAGNOSIS — G8929 Other chronic pain: Secondary | ICD-10-CM | POA: Diagnosis not present

## 2021-06-18 DIAGNOSIS — B353 Tinea pedis: Secondary | ICD-10-CM | POA: Diagnosis not present

## 2021-06-18 DIAGNOSIS — E7800 Pure hypercholesterolemia, unspecified: Secondary | ICD-10-CM | POA: Diagnosis not present

## 2021-06-18 DIAGNOSIS — I10 Essential (primary) hypertension: Secondary | ICD-10-CM | POA: Diagnosis not present

## 2021-06-18 DIAGNOSIS — E1142 Type 2 diabetes mellitus with diabetic polyneuropathy: Secondary | ICD-10-CM | POA: Diagnosis not present

## 2021-06-18 DIAGNOSIS — M1711 Unilateral primary osteoarthritis, right knee: Secondary | ICD-10-CM | POA: Diagnosis not present

## 2021-06-18 DIAGNOSIS — E1122 Type 2 diabetes mellitus with diabetic chronic kidney disease: Secondary | ICD-10-CM | POA: Diagnosis not present

## 2021-06-18 DIAGNOSIS — G4709 Other insomnia: Secondary | ICD-10-CM | POA: Diagnosis not present

## 2021-06-18 DIAGNOSIS — F1320 Sedative, hypnotic or anxiolytic dependence, uncomplicated: Secondary | ICD-10-CM | POA: Diagnosis not present

## 2021-06-18 DIAGNOSIS — M791 Myalgia, unspecified site: Secondary | ICD-10-CM | POA: Diagnosis not present

## 2021-06-18 DIAGNOSIS — J309 Allergic rhinitis, unspecified: Secondary | ICD-10-CM | POA: Diagnosis not present

## 2021-06-18 DIAGNOSIS — D509 Iron deficiency anemia, unspecified: Secondary | ICD-10-CM | POA: Diagnosis not present

## 2021-06-18 DIAGNOSIS — I700 Atherosclerosis of aorta: Secondary | ICD-10-CM | POA: Diagnosis not present

## 2021-06-18 DIAGNOSIS — F419 Anxiety disorder, unspecified: Secondary | ICD-10-CM | POA: Diagnosis not present

## 2021-06-18 DIAGNOSIS — M169 Osteoarthritis of hip, unspecified: Secondary | ICD-10-CM | POA: Diagnosis not present

## 2021-06-18 DIAGNOSIS — M25562 Pain in left knee: Secondary | ICD-10-CM | POA: Diagnosis not present

## 2021-06-18 DIAGNOSIS — N400 Enlarged prostate without lower urinary tract symptoms: Secondary | ICD-10-CM | POA: Diagnosis not present

## 2021-06-18 DIAGNOSIS — L723 Sebaceous cyst: Secondary | ICD-10-CM | POA: Diagnosis not present

## 2021-06-18 DIAGNOSIS — I2510 Atherosclerotic heart disease of native coronary artery without angina pectoris: Secondary | ICD-10-CM | POA: Diagnosis not present

## 2021-06-18 DIAGNOSIS — N189 Chronic kidney disease, unspecified: Secondary | ICD-10-CM | POA: Diagnosis not present

## 2021-06-24 DIAGNOSIS — I340 Nonrheumatic mitral (valve) insufficiency: Secondary | ICD-10-CM | POA: Diagnosis not present

## 2021-06-24 DIAGNOSIS — D631 Anemia in chronic kidney disease: Secondary | ICD-10-CM | POA: Diagnosis not present

## 2021-06-24 DIAGNOSIS — M791 Myalgia, unspecified site: Secondary | ICD-10-CM | POA: Diagnosis not present

## 2021-06-24 DIAGNOSIS — E1142 Type 2 diabetes mellitus with diabetic polyneuropathy: Secondary | ICD-10-CM | POA: Diagnosis not present

## 2021-06-24 DIAGNOSIS — I700 Atherosclerosis of aorta: Secondary | ICD-10-CM | POA: Diagnosis not present

## 2021-06-24 DIAGNOSIS — B353 Tinea pedis: Secondary | ICD-10-CM | POA: Diagnosis not present

## 2021-06-24 DIAGNOSIS — M169 Osteoarthritis of hip, unspecified: Secondary | ICD-10-CM | POA: Diagnosis not present

## 2021-06-24 DIAGNOSIS — H6120 Impacted cerumen, unspecified ear: Secondary | ICD-10-CM | POA: Diagnosis not present

## 2021-06-24 DIAGNOSIS — F419 Anxiety disorder, unspecified: Secondary | ICD-10-CM | POA: Diagnosis not present

## 2021-06-24 DIAGNOSIS — I2510 Atherosclerotic heart disease of native coronary artery without angina pectoris: Secondary | ICD-10-CM | POA: Diagnosis not present

## 2021-06-24 DIAGNOSIS — L723 Sebaceous cyst: Secondary | ICD-10-CM | POA: Diagnosis not present

## 2021-06-24 DIAGNOSIS — N189 Chronic kidney disease, unspecified: Secondary | ICD-10-CM | POA: Diagnosis not present

## 2021-06-24 DIAGNOSIS — J309 Allergic rhinitis, unspecified: Secondary | ICD-10-CM | POA: Diagnosis not present

## 2021-06-24 DIAGNOSIS — F1320 Sedative, hypnotic or anxiolytic dependence, uncomplicated: Secondary | ICD-10-CM | POA: Diagnosis not present

## 2021-06-24 DIAGNOSIS — E1122 Type 2 diabetes mellitus with diabetic chronic kidney disease: Secondary | ICD-10-CM | POA: Diagnosis not present

## 2021-06-24 DIAGNOSIS — E7800 Pure hypercholesterolemia, unspecified: Secondary | ICD-10-CM | POA: Diagnosis not present

## 2021-06-24 DIAGNOSIS — M25562 Pain in left knee: Secondary | ICD-10-CM | POA: Diagnosis not present

## 2021-06-24 DIAGNOSIS — M1711 Unilateral primary osteoarthritis, right knee: Secondary | ICD-10-CM | POA: Diagnosis not present

## 2021-06-24 DIAGNOSIS — R296 Repeated falls: Secondary | ICD-10-CM | POA: Diagnosis not present

## 2021-06-24 DIAGNOSIS — G4709 Other insomnia: Secondary | ICD-10-CM | POA: Diagnosis not present

## 2021-06-24 DIAGNOSIS — G8929 Other chronic pain: Secondary | ICD-10-CM | POA: Diagnosis not present

## 2021-06-24 DIAGNOSIS — N400 Enlarged prostate without lower urinary tract symptoms: Secondary | ICD-10-CM | POA: Diagnosis not present

## 2021-06-24 DIAGNOSIS — I10 Essential (primary) hypertension: Secondary | ICD-10-CM | POA: Diagnosis not present

## 2021-06-24 DIAGNOSIS — D509 Iron deficiency anemia, unspecified: Secondary | ICD-10-CM | POA: Diagnosis not present

## 2021-06-30 ENCOUNTER — Ambulatory Visit (INDEPENDENT_AMBULATORY_CARE_PROVIDER_SITE_OTHER): Payer: PPO | Admitting: Cardiology

## 2021-06-30 ENCOUNTER — Other Ambulatory Visit: Payer: Self-pay

## 2021-06-30 ENCOUNTER — Encounter: Payer: Self-pay | Admitting: Cardiology

## 2021-06-30 VITALS — BP 132/60 | HR 61 | Ht 72.0 in | Wt 214.0 lb

## 2021-06-30 DIAGNOSIS — E782 Mixed hyperlipidemia: Secondary | ICD-10-CM | POA: Diagnosis not present

## 2021-06-30 DIAGNOSIS — N1832 Chronic kidney disease, stage 3b: Secondary | ICD-10-CM

## 2021-06-30 DIAGNOSIS — I25119 Atherosclerotic heart disease of native coronary artery with unspecified angina pectoris: Secondary | ICD-10-CM | POA: Diagnosis not present

## 2021-06-30 DIAGNOSIS — I10 Essential (primary) hypertension: Secondary | ICD-10-CM | POA: Diagnosis not present

## 2021-06-30 MED ORDER — NITROGLYCERIN 0.4 MG SL SUBL: 0.4000 mg | SUBLINGUAL_TABLET | SUBLINGUAL | 3 refills | Status: DC | PRN

## 2021-06-30 NOTE — Patient Instructions (Signed)
Medication Instructions:  Continue all current medications.   Labwork: none  Testing/Procedures: none  Follow-Up: 6 months   Any Other Special Instructions Will Be Listed Below (If Applicable).   If you need a refill on your cardiac medications before your next appointment, please call your pharmacy.  

## 2021-06-30 NOTE — Progress Notes (Signed)
Cardiology Office Note  Date: 06/30/2021   ID: Bradley Sheppard, Bradley Sheppard March 05, 1941, MRN [id number]  PCP:  [name], MD  Cardiologist:  Rozann Lesches, MD Electrophysiologist:  None   Chief Complaint  Patient presents with   Cardiac follow-up    History of Present Illness: Bradley Sheppard is an 81 y.o. male last seen in August 2022.  He is here today with his wife and daughter for a follow-up visit.  He does not report any angina or nitroglycerin use, but has had relative weakness and difficulty ambulating.  Currently in a wheelchair today.  He has had home PT to try to improve his strength.  Compounding the problem is anemia, his most recent hemoglobin was 9.1.  He has dark stools but is on oral iron supplements.  He has been seen by hematology in the past for iron infusions through [facility].  I reviewed his cardiac regimen which is outlined below.  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  I reviewed the remainder of his lab work which is noted below.  Past Medical History:  Diagnosis Date   Anxiety    Arthritis    Asthma    Chronic pain    CKD (chronic kidney disease) stage 3, GFR 30-59 ml/min (HCC)    Coronary atherosclerosis of native coronary artery    Multvessel, DES to LAD and RCA 8/03; EF 65% by echo 11/2014   DM2 (diabetes mellitus, type 2) ([place])    Essential hypertension    Gastritis    Related to NSAIDs   GERD (gastroesophageal reflux disease)    Iron deficiency anemia    Mitral valve regurgitation    Mixed hyperlipidemia    Nephrolithiasis     Past Surgical History:  Procedure Laterality Date   APPENDECTOMY     CARDIAC CATHETERIZATION  01/2002   90% obstruction in proximal LAD, 60 % obstruction in proximal circumflex and 70%  in proximal RCA. LAD & RCA stented   L knee replacement     Subsequent infectino requiring resectino  arthroplasty with incision and drainage 8/10, and reimplantizathion arthroplasty, 9/20    Current Outpatient Medications  Medication Sig Dispense Refill   aspirin 81 MG tablet Take 81 mg by mouth daily.     carvedilol (COREG) 12.5 MG tablet Take 18.75 mg by mouth 2 (two) times daily.     chlorthalidone (HYGROTON) 25 MG tablet Take 25 mg by mouth daily.     glipiZIDE (GLUCOTROL XL) 10 MG 24 hr tablet Take 10 mg by mouth 2 (two) times daily.     IRON PO Take 1 tablet by mouth 2 (two) times daily.     LORazepam (ATIVAN) 1 MG tablet Take 1 mg by mouth at bedtime.     metFORMIN (GLUMETZA) 500 MG (MOD) 24 hr tablet Take 1,000 mg by mouth. Take 2 tabs in the morning and 1 tab at night     pioglitazone (ACTOS) 45 MG tablet Take 45 mg by mouth daily.     ramipril (ALTACE) 10 MG tablet Take 10 mg by mouth 2 (two) times daily.     rosuvastatin (CRESTOR) 20 MG tablet Take 20 mg by mouth daily.     Semaglutide (OZEMPIC, 0.25 OR 0.5 MG/DOSE, [place]) Inject 0.25 mLs into the skin once a week.     tiZANidine (ZANAFLEX) 2 MG tablet      valsartan (  DIOVAN) 320 MG tablet Take 320 mg by mouth daily.     nitroGLYCERIN (NITROSTAT) 0.4 MG SL tablet Place 1 tablet (0.4 mg total) under the tongue every 5 (five) minutes as needed. 25 tablet 3   No current facility-administered medications for this visit.   Allergies:  Prednisone   ROS: Chronic hearing loss.  Physical Exam: VS:  BP 132/60    Pulse 61    Ht 6' (1.829 m)    Wt 214 lb (97.1 kg)    SpO2 97%    BMI 29.02 kg/m , BMI Body mass index is 29.02 kg/m.  Wt Readings from Last 3 Encounters:  06/30/21 214 lb (97.1 kg)  12/25/20 206 lb 3.2 oz (93.5 kg)  06/18/20 207 lb (93.9 kg)    General: Patient appears comfortable at rest. HEENT: Conjunctiva and lids normal, wearing a mask. Neck: Supple, no elevated JVP or carotid bruits, no thyromegaly. Lungs: Clear to auscultation, nonlabored breathing at rest. Cardiac: Regular rate and rhythm, no S3, 2/6 systolic  murmur. Extremities: Chronic appearing leg edema.  ECG:  An ECG dated 03/07/2020 was personally reviewed today and demonstrated:  Sinus rhythm with IVCD/left anterior fascicular block, nonspecific ST changes, rule out old anterior infarct pattern.  Recent Labwork:  August 2021: Hemoglobin 10.3, platelets 234, TSH 2.29, cholesterol 138, triglycerides 136, HDL 36, LDL 78, BUN 49, creatinine 1.61, potassium 5.0, AST 22, ALT 15 January 2020: Hemoglobin A1c 7.3% January 2023: BUN 39, creatinine 1.56, potassium 4.8, AST 20, ALT 10, Hgb 10, platelets 229 February 2023: Hgb 9.1, platelets 218, BUN 36, creatinine 1.40, potassium 4.7  Other Studies Reviewed Today:  Echocardiogram 07/09/2020:  1. Left ventricular ejection fraction, by estimation, is 60 to 65%. The  left ventricle has normal function. The left ventricle has no regional  wall motion abnormalities. There is moderate left ventricular hypertrophy.  Left ventricular diastolic  parameters are indeterminate. Elevated left atrial pressure.   2. Right ventricular systolic function is normal. The right ventricular  size is normal.   3. Left atrial size was severely dilated.   4. Right atrial size was mild to moderately dilated.   5. The pericardial effusion is circumferential.   6. The mitral valve is normal in structure. No evidence of mitral valve  regurgitation. No evidence of mitral stenosis.   7. The aortic valve is tricuspid. There is mild calcification of the  aortic valve. There is mild thickening of the aortic valve. Aortic valve  regurgitation is not visualized. Mild aortic valve stenosis. Aortic valve  mean gradient measures 13.4 mmHg.  Aortic valve peak gradient measures 26.1 mmHg. Aortic valve area, by VTI  measures 1.69 cm.   8. The inferior vena cava is normal in size with greater than 50%  respiratory variability, suggesting right atrial pressure of 3 mmHg.   Carotid Dopplers 01/15/2021: Summary:  Right Carotid:  Velocities in the right ICA are consistent with a 1-39%  stenosis.   Left Carotid: Velocities in the left ICA are consistent with a 1-39%  stenosis.   Vertebrals:  Bilateral vertebral arteries demonstrate antegrade flow.  Subclavians: Normal flow hemodynamics were seen in bilateral subclavian               arteries.   Assessment and Plan:  1.  Multivessel CAD status post DES to the LAD and RCA in 2003.  No reported angina symptoms, or refill for old bottle of nitroglycerin.  ECG reviewed and stable.  Continue aspirin (would take after food), Coreg,  Diovan, and Crestor.  2.  Mild calcific aortic stenosis, no change in cardiac murmur.  Echocardiogram most recently performed in March 2022.  Continue observation for now.  3.  Mixed hyperlipidemia, he continues on Crestor.  4.  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  Medication Adjustments/Labs and Tests Ordered: Current medicines are reviewed at length with the patient today.  Concerns regarding medicines are outlined above.   Tests Ordered: Orders Placed This Encounter  Procedures   EKG 12-Lead    Medication Changes: Meds ordered this encounter  Medications   nitroGLYCERIN (NITROSTAT) 0.4 MG SL tablet    Sig: Place 1 tablet (0.4 mg total) under the tongue every 5 (five) minutes as needed.    Dispense:  25 tablet    Refill:  3    Disposition:  Follow up  6 months.  Signed, Satira Sark, MD, [facility] 06/30/2021 12:01 PM    [place] at [place], [place],  [address] Phone: [phone]; Fax: [phone]

## 2021-07-01 DIAGNOSIS — I10 Essential (primary) hypertension: Secondary | ICD-10-CM | POA: Diagnosis not present

## 2021-07-02 DIAGNOSIS — E1165 Type 2 diabetes mellitus with hyperglycemia: Secondary | ICD-10-CM | POA: Diagnosis not present

## 2021-07-02 DIAGNOSIS — I25119 Atherosclerotic heart disease of native coronary artery with unspecified angina pectoris: Secondary | ICD-10-CM | POA: Diagnosis not present

## 2021-07-02 DIAGNOSIS — I10 Essential (primary) hypertension: Secondary | ICD-10-CM | POA: Diagnosis not present

## 2021-07-02 DIAGNOSIS — Z299 Encounter for prophylactic measures, unspecified: Secondary | ICD-10-CM | POA: Diagnosis not present

## 2021-07-02 DIAGNOSIS — D649 Anemia, unspecified: Secondary | ICD-10-CM | POA: Diagnosis not present

## 2021-07-03 DIAGNOSIS — G4709 Other insomnia: Secondary | ICD-10-CM | POA: Diagnosis not present

## 2021-07-03 DIAGNOSIS — J309 Allergic rhinitis, unspecified: Secondary | ICD-10-CM | POA: Diagnosis not present

## 2021-07-03 DIAGNOSIS — R296 Repeated falls: Secondary | ICD-10-CM | POA: Diagnosis not present

## 2021-07-03 DIAGNOSIS — N400 Enlarged prostate without lower urinary tract symptoms: Secondary | ICD-10-CM | POA: Diagnosis not present

## 2021-07-03 DIAGNOSIS — L723 Sebaceous cyst: Secondary | ICD-10-CM | POA: Diagnosis not present

## 2021-07-03 DIAGNOSIS — F419 Anxiety disorder, unspecified: Secondary | ICD-10-CM | POA: Diagnosis not present

## 2021-07-03 DIAGNOSIS — M25562 Pain in left knee: Secondary | ICD-10-CM | POA: Diagnosis not present

## 2021-07-03 DIAGNOSIS — B353 Tinea pedis: Secondary | ICD-10-CM | POA: Diagnosis not present

## 2021-07-03 DIAGNOSIS — I10 Essential (primary) hypertension: Secondary | ICD-10-CM | POA: Diagnosis not present

## 2021-07-03 DIAGNOSIS — E7800 Pure hypercholesterolemia, unspecified: Secondary | ICD-10-CM | POA: Diagnosis not present

## 2021-07-03 DIAGNOSIS — N189 Chronic kidney disease, unspecified: Secondary | ICD-10-CM | POA: Diagnosis not present

## 2021-07-03 DIAGNOSIS — M791 Myalgia, unspecified site: Secondary | ICD-10-CM | POA: Diagnosis not present

## 2021-07-03 DIAGNOSIS — D631 Anemia in chronic kidney disease: Secondary | ICD-10-CM | POA: Diagnosis not present

## 2021-07-03 DIAGNOSIS — G8929 Other chronic pain: Secondary | ICD-10-CM | POA: Diagnosis not present

## 2021-07-03 DIAGNOSIS — I700 Atherosclerosis of aorta: Secondary | ICD-10-CM | POA: Diagnosis not present

## 2021-07-03 DIAGNOSIS — F1320 Sedative, hypnotic or anxiolytic dependence, uncomplicated: Secondary | ICD-10-CM | POA: Diagnosis not present

## 2021-07-03 DIAGNOSIS — H6120 Impacted cerumen, unspecified ear: Secondary | ICD-10-CM | POA: Diagnosis not present

## 2021-07-03 DIAGNOSIS — D509 Iron deficiency anemia, unspecified: Secondary | ICD-10-CM | POA: Diagnosis not present

## 2021-07-03 DIAGNOSIS — M1711 Unilateral primary osteoarthritis, right knee: Secondary | ICD-10-CM | POA: Diagnosis not present

## 2021-07-03 DIAGNOSIS — E1142 Type 2 diabetes mellitus with diabetic polyneuropathy: Secondary | ICD-10-CM | POA: Diagnosis not present

## 2021-07-03 DIAGNOSIS — I340 Nonrheumatic mitral (valve) insufficiency: Secondary | ICD-10-CM | POA: Diagnosis not present

## 2021-07-03 DIAGNOSIS — M169 Osteoarthritis of hip, unspecified: Secondary | ICD-10-CM | POA: Diagnosis not present

## 2021-07-03 DIAGNOSIS — E1122 Type 2 diabetes mellitus with diabetic chronic kidney disease: Secondary | ICD-10-CM | POA: Diagnosis not present

## 2021-07-03 DIAGNOSIS — I2510 Atherosclerotic heart disease of native coronary artery without angina pectoris: Secondary | ICD-10-CM | POA: Diagnosis not present

## 2021-07-11 ENCOUNTER — Inpatient Hospital Stay (HOSPITAL_COMMUNITY): Payer: PPO | Attending: Hematology | Admitting: Hematology

## 2021-07-11 ENCOUNTER — Other Ambulatory Visit: Payer: Self-pay

## 2021-07-11 ENCOUNTER — Inpatient Hospital Stay (HOSPITAL_COMMUNITY): Payer: PPO

## 2021-07-11 DIAGNOSIS — K921 Melena: Secondary | ICD-10-CM | POA: Insufficient documentation

## 2021-07-11 DIAGNOSIS — N189 Chronic kidney disease, unspecified: Secondary | ICD-10-CM | POA: Insufficient documentation

## 2021-07-11 DIAGNOSIS — D649 Anemia, unspecified: Secondary | ICD-10-CM | POA: Diagnosis not present

## 2021-07-11 DIAGNOSIS — F17220 Nicotine dependence, chewing tobacco, uncomplicated: Secondary | ICD-10-CM | POA: Diagnosis not present

## 2021-07-11 DIAGNOSIS — N182 Chronic kidney disease, stage 2 (mild): Secondary | ICD-10-CM | POA: Insufficient documentation

## 2021-07-11 DIAGNOSIS — K5900 Constipation, unspecified: Secondary | ICD-10-CM | POA: Insufficient documentation

## 2021-07-11 DIAGNOSIS — Z96652 Presence of left artificial knee joint: Secondary | ICD-10-CM | POA: Insufficient documentation

## 2021-07-11 LAB — IRON AND TIBC
Iron: 55 ug/dL (ref 45–182)
Saturation Ratios: 14 % — ABNORMAL LOW (ref 17.9–39.5)
TIBC: 406 ug/dL (ref 250–450)
UIBC: 351 ug/dL

## 2021-07-11 LAB — RETICULOCYTES
Immature Retic Fract: 13.5 % (ref 2.3–15.9)
RBC.: 3.15 MIL/uL — ABNORMAL LOW (ref 4.22–5.81)
Retic Count, Absolute: 31.8 10*3/uL (ref 19.0–186.0)
Retic Ct Pct: 1 % (ref 0.4–3.1)

## 2021-07-11 LAB — CBC WITH DIFFERENTIAL/PLATELET
Abs Immature Granulocytes: 0.02 10*3/uL (ref 0.00–0.07)
Basophils Absolute: 0 10*3/uL (ref 0.0–0.1)
Basophils Relative: 0 %
Eosinophils Absolute: 0.2 10*3/uL (ref 0.0–0.5)
Eosinophils Relative: 2 %
HCT: 29.4 % — ABNORMAL LOW (ref 39.0–52.0)
Hemoglobin: 9.5 g/dL — ABNORMAL LOW (ref 13.0–17.0)
Immature Granulocytes: 0 %
Lymphocytes Relative: 31 %
Lymphs Abs: 2.5 10*3/uL (ref 0.7–4.0)
MCH: 30.7 pg (ref 26.0–34.0)
MCHC: 32.3 g/dL (ref 30.0–36.0)
MCV: 95.1 fL (ref 80.0–100.0)
Monocytes Absolute: 0.5 10*3/uL (ref 0.1–1.0)
Monocytes Relative: 7 %
Neutro Abs: 4.8 10*3/uL (ref 1.7–7.7)
Neutrophils Relative %: 60 %
Platelets: 215 10*3/uL (ref 150–400)
RBC: 3.09 MIL/uL — ABNORMAL LOW (ref 4.22–5.81)
RDW: 14 % (ref 11.5–15.5)
WBC: 8.1 10*3/uL (ref 4.0–10.5)
nRBC: 0 % (ref 0.0–0.2)

## 2021-07-11 LAB — LACTATE DEHYDROGENASE: LDH: 169 U/L (ref 98–192)

## 2021-07-11 LAB — FERRITIN: Ferritin: 13 ng/mL — ABNORMAL LOW (ref 24–336)

## 2021-07-11 LAB — FOLATE: Folate: 11.5 ng/mL (ref >5.9)

## 2021-07-11 LAB — VITAMIN B12: Vitamin B-12: 263 pg/mL (ref 180–914)

## 2021-07-11 NOTE — Progress Notes (Signed)
[place] [address], [place] [address]   CLINIC:  Medical Oncology/Hematology  Patient Care Team: [name], MD as PCP - General (Internal Medicine) Satira Sark, MD as PCP - Cardiology (Cardiology) Derek Jack, MD as Medical Oncologist (Hematology)  CHIEF COMPLAINTS/PURPOSE OF CONSULTATION:  Evaluation of anemia  HISTORY OF PRESENTING ILLNESS:  Bradley Sheppard 81 y.o. male is here because of evaluation of anemia, at the request of Dr. Woody Seller.  Today he reports feeling good, and he is accompanied by his wife. 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 He had a blood transfusion at the time of his hospitalization for his knee replacement in 2009. He denies hematochezia. He is taking an iron tablet BID from which he experiences occasional constipation and black stools. He reports he received 2 previous iron infusions in [place]. Prior to retirement he worked at a [employer]. He currently uses chewing tobacco, and he quit smoking in 1984. He denies family history of cancer.    MEDICAL HISTORY:  Past Medical History:  Diagnosis Date   Anxiety    Arthritis    Asthma    Chronic pain    CKD (chronic kidney disease) stage 3, GFR 30-59 ml/min (HCC)    Coronary atherosclerosis of native coronary artery    Multvessel, DES to LAD and RCA 8/03; EF 65% by echo 11/2014   DM2 (diabetes mellitus, type 2) ([place])    Essential hypertension    Gastritis    Related to NSAIDs   GERD (gastroesophageal reflux disease)    Iron deficiency anemia    Mitral valve regurgitation    Mixed hyperlipidemia    Nephrolithiasis     SURGICAL HISTORY: Past Surgical History:  Procedure Laterality Date   APPENDECTOMY     CARDIAC CATHETERIZATION  01/2002   90% obstruction in proximal LAD, 60 % obstruction in proximal circumflex and 70%  in proximal RCA. LAD & RCA stented    L knee replacement     Subsequent infectino requiring resectino arthroplasty with incision and drainage 8/10, and reimplantizathion arthroplasty, 9/20    SOCIAL HISTORY: Social History   Socioeconomic History   Marital status: Married    Spouse name: Not on file   Number of children: Not on file   Years of education: Not on file   Highest education level: Not on file  Occupational History   Not on file  Tobacco Use   Smoking status: Former    Types: Cigarettes   Smokeless tobacco: Current    Types: Chew  Substance and Sexual Activity   Alcohol use: No   Drug use: No   Sexual activity: Not on file  Other Topics Concern   Not on file  Social History Narrative   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  MEDICATIONS:  Current Outpatient Medications  Medication Sig Dispense Refill   amLODipine (NORVASC) 10 MG tablet Take 10 mg by mouth daily.     aspirin 81 MG tablet Take 81 mg by mouth daily.     carvedilol (COREG) 12.5 MG tablet Take 18.75 mg by mouth 2 (two) times daily.     chlorthalidone (HYGROTON) 25 MG tablet Take 25 mg by mouth daily.     glipiZIDE (GLUCOTROL XL) 10 MG 24 hr tablet Take 10 mg by mouth 2 (two) times daily.     IRON PO Take 1 tablet by mouth 2 (two) times daily.     LORazepam (ATIVAN) 1 MG tablet Take 1 mg by mouth at bedtime.     metFORMIN (GLUMETZA) 500 MG (MOD) 24 hr tablet Take 1,000 mg by mouth. Take 2 tabs in the morning and 1 tab at night     nitroGLYCERIN (NITROSTAT) 0.4 MG SL tablet Place 1  tablet (0.4 mg total) under the tongue every 5 (five) minutes as needed. 25 tablet 3   pioglitazone (ACTOS) 45 MG tablet Take 45 mg by mouth daily.     ramipril (ALTACE) 10 MG tablet Take 10 mg by mouth 2 (two) times daily.     rosuvastatin (CRESTOR) 20 MG tablet Take 20 mg by mouth daily.     Semaglutide (OZEMPIC, 0.25 OR 0.5 MG/DOSE, [place]) Inject 0.25 mLs into the skin once a week.     tiZANidine (ZANAFLEX) 2 MG tablet      valsartan (DIOVAN) 320 MG tablet Take 320 mg by mouth daily.     No current facility-administered medications for this visit.    REVIEW OF SYSTEMS:   Review of Systems  Constitutional:  Negative for appetite change and fatigue.  Musculoskeletal:  Positive for arthralgias (6/10 L + R legs and shoulders).  Neurological:  Positive for numbness.  Hematological:  Bruises/bleeds easily.  All other systems reviewed and are negative.   PHYSICAL EXAMINATION: ECOG PERFORMANCE STATUS: 2 - Symptomatic, <50% confined to bed  Vitals:   07/11/21 1113  BP: (!) 124/58  Pulse: 64  Resp: 18  Temp: 97.6 F (36.4 C)  SpO2: 100%   Filed Weights   07/11/21 1113  Weight: 212 lb 8 oz (96.4 kg)   Physical Exam Vitals reviewed.  Constitutional:      Appearance: Normal appearance.     Comments: In wheelchair  Cardiovascular:     Rate and Rhythm: Normal rate and regular rhythm.     Pulses: Normal pulses.     Heart sounds: Normal heart sounds.  Pulmonary:     Effort: Pulmonary effort is normal.     Breath sounds: Normal breath sounds.  Abdominal:     Palpations: Abdomen is soft. There is no hepatomegaly, splenomegaly or mass.     Tenderness: There is no abdominal tenderness.  Musculoskeletal:     Right lower leg: 2+ Edema present.     Left lower leg: 2+ Edema present.  Lymphadenopathy:     Cervical: No cervical adenopathy.     Right cervical: No superficial cervical adenopathy.    Left cervical: No superficial cervical adenopathy.     Upper Body:     Right upper body:  No supraclavicular adenopathy.     Left upper body: No supraclavicular adenopathy.  Neurological:     General: No focal deficit present.     Mental Status: He is alert and oriented to person, place, and time.  Psychiatric:        Mood and Affect: Mood normal.        Behavior: Behavior normal.     LABORATORY DATA:  I have reviewed the data as listed No results found for this or any previous visit (from the past 2160 hour(s)).  RADIOGRAPHIC STUDIES: I have personally reviewed the radiological images as listed and agreed with the findings in the report. No results found.  ASSESSMENT:  Normocytic anemia: - Patient seen at the request of Dr. Woody Seller - CBC on 06/12/2021 with hemoglobin 9.1, MCV 94 with normal white count and platelet count.  Creatinine was 1.4. - CBC on 05/26/2021: Hemoglobin-10, creatinine 1.56 - CBC on 02/24/2021: Hemoglobin-9.6 - He reports that he has received Feraheme in December 2020 in [place] without any major problems. - He is currently taking iron tablet twice daily.  Mild constipation. - Last colonoscopy in 2010 with polyps.   Social/family history: - He lives at home with his wife.  He walks with help of walker since his left knee replacement and multiple infections following it. 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 - He quit smoking in 1984.  But chews tobacco at this time. - No family history of cancers.   PLAN:  Normocytic anemia: - Normocytic anemia likely from relative iron deficiency and CKD. - We will repeat his CBC today, check ferritin and iron panel.  We will also check K44, folic acid, MMA, copper. - We will check for bone marrow infiltrative process including SPEP, free light chains and immunofixation. - We will follow-up with a phone visit in 1 to 2 weeks.  Based on his iron indices, he might need parenteral iron therapy.   All questions were answered. The patient knows to call the clinic with any problems, questions or  concerns.  Derek Jack, MD 07/11/21 11:40 AM  [place] [phone]   I, Thana Ates, am acting as a scribe for Dr. Derek Jack.  I, Derek Jack MD, have reviewed the above documentation for accuracy and completeness, and I agree with the above.

## 2021-07-11 NOTE — Patient Instructions (Addendum)
[place] at [facility] ?Discharge Instructions ? ?You were seen and examined today by Dr. Delton Coombes. Dr. Delton Coombes is a hematologist, meaning that he specializes in blood abnormalities. Dr. Delton Coombes discussed your past medical history, family history of cancers/blood conditions and the events that led to you being here today. ? ?You were referred to Dr. Delton Coombes due to anemia (low blood counts).  ? ?Dr. Delton Coombes has recommended additional lab work today in an attempt to identify the cause of your anemia. ? ?Follow-up as scheduled. ? ? ?Thank you for choosing [place] at [facility] to provide your oncology and hematology care.  To afford each patient quality time with our provider, please arrive at least 15 minutes before your scheduled appointment time.  ? ?If you have a lab appointment with the [facility] please come in thru the Main Entrance and check in at the main information desk. ? ?You need to re-schedule your appointment should you arrive 10 or more minutes late.  We strive to give you quality time with our providers, and arriving late affects you and other patients whose appointments are after yours.  Also, if you no show three or more times for appointments you may be dismissed from the clinic at the providers discretion.     ?Again, thank you for choosing [facility].  Our hope is that these requests will decrease the amount of time that you wait before being seen by our physicians.       ?_____________________________________________________________ ? ?Should you have questions after your visit to [facility], please contact our office at [phone] and follow the prompts.  Our office hours are 8:00 a.m. and 4:30 p.m. Monday - Friday.  Please note that voicemails left after 4:00 p.m. may not be returned until the following business day.  We are closed weekends and major holidays.  You do have access to a nurse  24-7, just call the main number to the clinic [phone] and do not press any options, hold on the line and a nurse will answer the phone.   ? ?For prescription refill requests, have your pharmacy contact our office and allow 72 hours.   ? ?Due to Covid, you will need to wear a mask upon entering the hospital. If you do not have a mask, a mask will be given to you at the Main Entrance upon arrival. For doctor visits, patients may have 1 support person age 65 or older with them. For treatment visits, patients can not have anyone with them due to social distancing guidelines and our immunocompromised population.  ? ? ? ?

## 2021-07-14 LAB — PROTEIN ELECTROPHORESIS, SERUM
A/G Ratio: 0.9 (ref 0.7–1.7)
Albumin ELP: 3.3 g/dL (ref 2.9–4.4)
Alpha-1-Globulin: 0.2 g/dL (ref 0.0–0.4)
Alpha-2-Globulin: 0.8 g/dL (ref 0.4–1.0)
Beta Globulin: 0.8 g/dL (ref 0.7–1.3)
Gamma Globulin: 1.9 g/dL — ABNORMAL HIGH (ref 0.4–1.8)
Globulin, Total: 3.8 g/dL (ref 2.2–3.9)
M-Spike, %: 1.4 g/dL — ABNORMAL HIGH
Total Protein ELP: 7.1 g/dL (ref 6.0–8.5)

## 2021-07-14 LAB — KAPPA/LAMBDA LIGHT CHAINS
Kappa free light chain: 313.4 mg/L — ABNORMAL HIGH (ref 3.3–19.4)
Kappa, lambda light chain ratio: 17.61 — ABNORMAL HIGH (ref 0.26–1.65)
Lambda free light chains: 17.8 mg/L (ref 5.7–26.3)

## 2021-07-14 LAB — METHYLMALONIC ACID, SERUM: Methylmalonic Acid, Quantitative: 643 nmol/L — ABNORMAL HIGH (ref 0–378)

## 2021-07-14 LAB — COPPER, SERUM: Copper: 103 ug/dL (ref 69–132)

## 2021-07-16 LAB — IMMUNOFIXATION ELECTROPHORESIS
IgA: 74 mg/dL (ref 61–437)
IgG (Immunoglobin G), Serum: 2224 mg/dL — ABNORMAL HIGH (ref 603–1613)
IgM (Immunoglobulin M), Srm: 44 mg/dL (ref 15–143)
Total Protein ELP: 7.3 g/dL (ref 6.0–8.5)

## 2021-07-27 NOTE — Progress Notes (Signed)
? ?[facility] ?[address] ?[place], [place] [address] ? ? ?CLINIC:  ?Medical Oncology/Hematology ? ?PCP:  ?[name], MD ?[place] ?[place] [address] ?[phone] ? ? ?REASON FOR VISIT:  ?Follow-up for normocytic anemia ? ?CURRENT THERAPY: Under work-up ? ?INTERVAL HISTORY:  ?Bradley Sheppard 81 y.o. male returns for routine follow-up of his normocytic anemia.  He was seen for initial consultation by Dr. Delton Coombes on 07/11/2021. ? ?At today's visit, he reports feeling fair.  He has not had any changes since his original visit with Dr. Delton Coombes 2 weeks ago. ? ?He has occasional black stools, which he wonders if they are from his iron tablet.  He denies any frank rectal bleeding.  He is symptomatic with fatigue and dyspnea on exertion.  He denies any pica, lightheadedness, or syncope.  He continues to take iron tablet twice daily. ?He reports bilateral leg pain described as a burning pain.  He has never been diagnosed with any neuropathy.  He denies any fever, chills, night sweats, unintentional weight loss. ? ?He has 40% energy and 100% appetite. He endorses that he is maintaining a stable weight. ? ? ?REVIEW OF SYSTEMS:  ?Review of Systems  ?Constitutional:  Positive for fatigue. Negative for appetite change, chills, diaphoresis, fever and unexpected weight change.  ?HENT:   Negative for lump/mass and nosebleeds.   ?Eyes:  Negative for eye problems.  ?Respiratory:  Positive for shortness of breath (with exeriton). Negative for cough and hemoptysis.   ?Cardiovascular:  Negative for chest pain, leg swelling and palpitations.  ?Gastrointestinal:  Positive for constipation (at times). Negative for abdominal pain, blood in stool, diarrhea, nausea and vomiting.  ?Genitourinary:  Negative for hematuria.   ?Musculoskeletal:  Positive for arthralgias.  ?Skin: Negative.   ?Neurological:  Positive for numbness (tingling in hands at times). Negative for dizziness, headaches and light-headedness.   ?Hematological:  Does not bruise/bleed easily.   ? ? ?PAST MEDICAL/SURGICAL HISTORY:  ?Past Medical History:  ?Diagnosis Date  ? Anxiety   ? Arthritis   ? Asthma   ? Chronic pain   ? CKD (chronic kidney disease) stage 3, GFR 30-59 ml/min (HCC)   ? Coronary atherosclerosis of native coronary artery   ? Multvessel, DES to LAD and RCA 8/03; EF 65% by echo 11/2014  ? DM2 (diabetes mellitus, type 2) ([place])   ? Essential hypertension   ? Gastritis   ? Related to NSAIDs  ? GERD (gastroesophageal reflux disease)   ? Iron deficiency anemia   ? Mitral valve regurgitation   ? Mixed hyperlipidemia   ? Nephrolithiasis   ? ?Past Surgical History:  ?Procedure Laterality Date  ? APPENDECTOMY    ? CARDIAC CATHETERIZATION  01/2002  ? 90% obstruction in proximal LAD, 60 % obstruction in proximal circumflex and 70%  in proximal RCA. LAD & RCA stented  ? L knee replacement    ? Subsequent infectino requiring resectino arthroplasty with incision and drainage 8/10, and reimplantizathion arthroplasty, 9/20  ? ? ? ?SOCIAL HISTORY:  ?Social History  ? ?Socioeconomic History  ? Marital status: Married  ?  Spouse name: Not on file  ? Number of children: Not on file  ? Years of education: Not on file  ? Highest education level: Not on file  ?Occupational History  ? Not on file  ?Tobacco Use  ? Smoking status: Former  ?  Types: Cigarettes  ? Smokeless tobacco: Current  ?  Types: Chew  ?Substance and Sexual Activity  ?  Alcohol use: No  ? Drug use: No  ? Sexual activity: Not on file  ?Other Topics Concern  ? Not on file  ?Social History Narrative  ? Full time- textiles  ?   ?   ? ?Social Determinants of Health  ? ?Financial Resource Strain: Not on file  ?Food Insecurity: Not on file  ?Transportation Needs: Not on file  ?Physical Activity: Not on file  ?Stress: Not on file  ?Social Connections: Not on file  ?Intimate Partner Violence: Not on file  ? ? ?FAMILY HISTORY:  ?Family History  ?Problem Relation Age of Onset  ? Stroke Other   ? Diabetes  Mother   ? Aneurysm Mother   ?     Brain  ? Stroke Father   ? ? ?CURRENT MEDICATIONS:  ?Outpatient Encounter Medications as of 07/28/2021  ?Medication Sig Note  ? amLODipine (NORVASC) 10 MG tablet Take 10 mg by mouth daily.   ? aspirin 81 MG tablet Take 81 mg by mouth daily.   ? carvedilol (COREG) 12.5 MG tablet Take 18.75 mg by mouth 2 (two) times daily.   ? chlorthalidone (HYGROTON) 25 MG tablet Take 25 mg by mouth daily.   ? glipiZIDE (GLUCOTROL XL) 10 MG 24 hr tablet Take 10 mg by mouth 2 (two) times daily.   ? IRON PO Take 1 tablet by mouth 2 (two) times daily.   ? LORazepam (ATIVAN) 1 MG tablet Take 1 mg by mouth at bedtime.   ? metFORMIN (GLUMETZA) 500 MG (MOD) 24 hr tablet Take 1,000 mg by mouth. Take 2 tabs in the morning and 1 tab at night   ? nitroGLYCERIN (NITROSTAT) 0.4 MG SL tablet Place 1 tablet (0.4 mg total) under the tongue every 5 (five) minutes as needed.   ? pioglitazone (ACTOS) 45 MG tablet Take 45 mg by mouth daily.   ? ramipril (ALTACE) 10 MG tablet Take 10 mg by mouth 2 (two) times daily.   ? rosuvastatin (CRESTOR) 20 MG tablet Take 20 mg by mouth daily.   ? Semaglutide (OZEMPIC, 0.25 OR 0.5 MG/DOSE, [place]) Inject 0.25 mLs into the skin once a week.   ? tiZANidine (ZANAFLEX) 2 MG tablet  02/20/2016: Received from: External Pharmacy  ? valsartan (DIOVAN) 320 MG tablet Take 320 mg by mouth daily.   ? ?No facility-administered encounter medications on file as of 07/28/2021.  ? ? ?ALLERGIES:  ?Allergies  ?Allergen Reactions  ? Prednisone   ?  "makes him feel faint"  ? ? ? ?PHYSICAL EXAM:  ?ECOG PERFORMANCE STATUS: 2 - Symptomatic, <50% confined to bed ? ?There were no vitals filed for this visit. ?There were no vitals filed for this visit. ?Physical Exam ?Constitutional:   ?   Appearance: Normal appearance.  ?   Comments: Presents in wheelchair  ?HENT:  ?   Head: Normocephalic and atraumatic.  ?   Ears:  ?   Comments: Hard of hearing ?   Mouth/Throat:  ?   Mouth: Mucous membranes are moist.  ?Eyes:   ?   Extraocular Movements: Extraocular movements intact.  ?   Pupils: Pupils are equal, round, and reactive to light.  ?Cardiovascular:  ?   Rate and Rhythm: Normal rate and regular rhythm.  ?   Pulses: Normal pulses.  ?   Heart sounds: Normal heart sounds.  ?Pulmonary:  ?   Effort: Pulmonary effort is normal.  ?   Breath sounds: Normal breath sounds.  ?Abdominal:  ?   General: Bowel sounds  are normal.  ?   Palpations: Abdomen is soft.  ?   Tenderness: There is no abdominal tenderness.  ?Musculoskeletal:     ?   General: No swelling.  ?   Right lower leg: Edema (2+ pitting edema, chronic) present.  ?   Left lower leg: Edema (2+ pitting edema, chronic) present.  ?Lymphadenopathy:  ?   Cervical: No cervical adenopathy.  ?Skin: ?   General: Skin is warm and dry.  ?Neurological:  ?   General: No focal deficit present.  ?   Mental Status: He is alert and oriented to person, place, and time.  ?Psychiatric:     ?   Mood and Affect: Mood normal.     ?   Behavior: Behavior normal.  ? ? ? ?LABORATORY DATA:  ?I have reviewed the labs as listed.  ?CBC ?   ?Component Value Date/Time  ? WBC 8.1 07/11/2021 1152  ? RBC 3.15 (L) 07/11/2021 1152  ? RBC 3.09 (L) 07/11/2021 1152  ? HGB 9.5 (L) 07/11/2021 1152  ? HCT 29.4 (L) 07/11/2021 1152  ? PLT 215 07/11/2021 1152  ? MCV 95.1 07/11/2021 1152  ? MCH 30.7 07/11/2021 1152  ? MCHC 32.3 07/11/2021 1152  ? RDW 14.0 07/11/2021 1152  ? LYMPHSABS 2.5 07/11/2021 1152  ? MONOABS 0.5 07/11/2021 1152  ? EOSABS 0.2 07/11/2021 1152  ? BASOSABS 0.0 07/11/2021 1152  ? ? ?  Latest Ref Rng & Units 01/06/2009  ?  5:00 AM 01/05/2009  ?  5:20 AM 01/04/2009  ?  6:23 AM  ?CMP  ?Glucose 70 - 99 mg/dL 94   140   100    ?BUN 6 - 23 mg/dL '10   11   12    '$ ?Creatinine 0.4 - 1.5 mg/dL 0.89   0.82   0.85    ?Sodium 135 - 145 mEq/L 140   137   138    ?Potassium 3.5 - 5.1 mEq/L 4.1   4.4   4.1    ?Chloride 96 - 112 mEq/L 105   106   104    ?CO2 19 - 32 mEq/L '29   27   27    '$ ?Calcium 8.4 - 10.5 mg/dL 8.6   8.5   8.8     ? ? ?DIAGNOSTIC IMAGING:  ?I have independently reviewed the relevant imaging and discussed with the patient. ? ? ?ASSESSMENT & PLAN: ?1.  Normocytic anemia: ?- Patient seen at the request of Dr. Woody Seller ?-- Prior labs sho

## 2021-07-28 ENCOUNTER — Other Ambulatory Visit: Payer: Self-pay

## 2021-07-28 ENCOUNTER — Other Ambulatory Visit (HOSPITAL_COMMUNITY): Payer: Self-pay | Admitting: Physician Assistant

## 2021-07-28 ENCOUNTER — Inpatient Hospital Stay (HOSPITAL_BASED_OUTPATIENT_CLINIC_OR_DEPARTMENT_OTHER): Payer: PPO | Admitting: Physician Assistant

## 2021-07-28 ENCOUNTER — Encounter (HOSPITAL_COMMUNITY): Payer: PPO | Admitting: Hematology

## 2021-07-28 ENCOUNTER — Encounter (HOSPITAL_COMMUNITY): Payer: Self-pay | Admitting: Physician Assistant

## 2021-07-28 ENCOUNTER — Inpatient Hospital Stay (HOSPITAL_COMMUNITY): Payer: PPO

## 2021-07-28 VITALS — BP 139/61 | HR 66 | Temp 97.0°F | Resp 18 | Ht 72.0 in | Wt 207.2 lb

## 2021-07-28 DIAGNOSIS — D509 Iron deficiency anemia, unspecified: Secondary | ICD-10-CM | POA: Insufficient documentation

## 2021-07-28 DIAGNOSIS — D631 Anemia in chronic kidney disease: Secondary | ICD-10-CM | POA: Diagnosis not present

## 2021-07-28 DIAGNOSIS — N189 Chronic kidney disease, unspecified: Secondary | ICD-10-CM | POA: Diagnosis not present

## 2021-07-28 DIAGNOSIS — D508 Other iron deficiency anemias: Secondary | ICD-10-CM

## 2021-07-28 DIAGNOSIS — D472 Monoclonal gammopathy: Secondary | ICD-10-CM

## 2021-07-28 DIAGNOSIS — N1832 Chronic kidney disease, stage 3b: Secondary | ICD-10-CM | POA: Diagnosis not present

## 2021-07-28 DIAGNOSIS — E538 Deficiency of other specified B group vitamins: Secondary | ICD-10-CM

## 2021-07-28 HISTORY — DX: Iron deficiency anemia, unspecified: D50.9

## 2021-07-28 LAB — COMPREHENSIVE METABOLIC PANEL
ALT: 13 U/L (ref 0–44)
AST: 24 U/L (ref 15–41)
Albumin: 3.5 g/dL (ref 3.5–5.0)
Alkaline Phosphatase: 65 U/L (ref 38–126)
Anion gap: 6 (ref 5–15)
BUN: 40 mg/dL — ABNORMAL HIGH (ref 8–23)
CO2: 23 mmol/L (ref 22–32)
Calcium: 9.1 mg/dL (ref 8.9–10.3)
Chloride: 110 mmol/L (ref 98–111)
Creatinine, Ser: 1.53 mg/dL — ABNORMAL HIGH (ref 0.61–1.24)
GFR, Estimated: 45 mL/min — ABNORMAL LOW (ref >60)
Glucose, Bld: 234 mg/dL — ABNORMAL HIGH (ref 70–99)
Potassium: 4.3 mmol/L (ref 3.5–5.1)
Sodium: 139 mmol/L (ref 135–145)
Total Bilirubin: 0.4 mg/dL (ref 0.3–1.2)
Total Protein: 7.6 g/dL (ref 6.5–8.1)

## 2021-07-28 LAB — LACTATE DEHYDROGENASE: LDH: 160 U/L (ref 98–192)

## 2021-07-28 NOTE — Patient Instructions (Signed)
[facility] at [facility] ?Discharge Instructions ? ?You were seen today by Tarri Abernethy PA-C for your anemia. ? ?ANEMIA: Your low blood levels are being caused by a variety of different things. ?LOW IRON: Continue taking your iron tablets daily.  We will schedule you for IV iron x2 doses.  We we will also check stool cards x3 to see if you are losing blood in your bowel movements. ?LOW B12: Start taking vitamin B12 over-the-counter supplement (500 mcg daily). ?CHRONIC KIDNEY DISEASE: When your kidneys do not work as well as they used to, it is common for patients to have some anemia as well.  If your blood levels do not get better after taking iron and B12, we may need to start you on shots to improve your blood counts. ? 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  We will check some additional tests, including x-rays of your whole body and 24-hour urine study.  This will help [place] to see if you have any signs of active cancer at this time. ? ?FOLLOW-UP APPOINTMENT: Office visit in 2-3 weeks to discuss results. ? ? ?Thank you for choosing [place] at [facility] to provide your oncology and hematology care.  To afford each patient quality time with our provider, please arrive at least 15 minutes before your scheduled appointment time.  ? ?If you have a lab appointment with the [facility] please come in thru the Main Entrance and check in at the main information desk. ? ?You need to re-schedule your appointment should you arrive 10 or more minutes late.  We strive to give you quality time with our providers, and arriving late affects you and other patients whose appointments are after yours.  Also, if you no show three or more times for appointments you may be dismissed from the clinic at the providers discretion.     ?Again, thank  you for choosing [facility].  Our hope is that these requests will decrease the amount of time that you wait before being seen by our physicians.       ?_____________________________________________________________ ? ?Should you have questions after your visit to [facility], please contact our office at [phone] and follow the prompts.  Our office hours are 8:00 a.m. and 4:30 p.m. Monday - Friday.  Please note that voicemails left after 4:00 p.m. may not be returned until the following business day.  We are closed weekends and major holidays.  You do have access to a nurse 24-7, just call the main number to the clinic [phone] and do not press any options, hold on the line and a nurse will answer the phone.   ? ?For prescription refill requests, have your pharmacy contact our office and allow 72 hours.   ? ?Due to Covid, you will need to wear a mask upon entering the hospital. If you do not have a mask, a mask will be given to you at the Main Entrance upon arrival. For doctor visits, patients may have 1 support person age 32 or older with them. For treatment visits, patients can not have anyone with them due to social distancing guidelines and our immunocompromised population.  ? ? ? ?

## 2021-07-29 LAB — BETA 2 MICROGLOBULIN, SERUM: Beta-2 Microglobulin: 4.4 mg/L — ABNORMAL HIGH (ref 0.6–2.4)

## 2021-07-30 ENCOUNTER — Other Ambulatory Visit (HOSPITAL_COMMUNITY): Payer: Self-pay

## 2021-07-30 DIAGNOSIS — N189 Chronic kidney disease, unspecified: Secondary | ICD-10-CM | POA: Diagnosis not present

## 2021-07-30 DIAGNOSIS — D472 Monoclonal gammopathy: Secondary | ICD-10-CM

## 2021-08-01 ENCOUNTER — Inpatient Hospital Stay (HOSPITAL_COMMUNITY): Payer: PPO

## 2021-08-01 ENCOUNTER — Ambulatory Visit (HOSPITAL_COMMUNITY)
Admission: RE | Admit: 2021-08-01 | Discharge: 2021-08-01 | Disposition: A | Discharge status: Home / Self Care | Payer: PPO | Source: Ambulatory Visit | Attending: Physician Assistant | Admitting: Physician Assistant

## 2021-08-01 VITALS — BP 134/59 | HR 61 | Temp 97.7°F | Resp 16

## 2021-08-01 DIAGNOSIS — D472 Monoclonal gammopathy: Secondary | ICD-10-CM | POA: Insufficient documentation

## 2021-08-01 DIAGNOSIS — N189 Chronic kidney disease, unspecified: Secondary | ICD-10-CM | POA: Diagnosis not present

## 2021-08-01 DIAGNOSIS — R937 Abnormal findings on diagnostic imaging of other parts of musculoskeletal system: Secondary | ICD-10-CM | POA: Diagnosis not present

## 2021-08-01 DIAGNOSIS — D508 Other iron deficiency anemias: Secondary | ICD-10-CM

## 2021-08-01 LAB — UPEP/UIFE/LIGHT CHAINS/TP, 24-HR UR
% BETA, Urine: 21 %
ALPHA 1 URINE: 4.4 %
Albumin, U: 49.4 %
Alpha 2, Urine: 8.9 %
Free Kappa Lt Chains,Ur: 124.81 mg/L — ABNORMAL HIGH (ref 1.17–86.46)
Free Kappa/Lambda Ratio: 18.41 — ABNORMAL HIGH (ref 1.83–14.26)
Free Lambda Lt Chains,Ur: 6.78 mg/L (ref 0.27–15.21)
GAMMA GLOBULIN URINE: 16.3 %
M-SPIKE %, Urine: 4.6 % — ABNORMAL HIGH
M-Spike, Mg/24 Hr: 15 mg/24 hr — ABNORMAL HIGH
Total Protein, Urine-Ur/day: 322 mg/24 hr — ABNORMAL HIGH (ref 30–150)
Total Protein, Urine: 21.8 mg/dL
Total Volume: 1475

## 2021-08-01 MED ORDER — SODIUM CHLORIDE 0.9 % IV SOLN: Freq: Once | INTRAVENOUS | Status: AC

## 2021-08-01 MED ORDER — LORATADINE 10 MG PO TABS
10.0000 mg | ORAL_TABLET | Freq: Once | ORAL | Status: AC
  Administered 2021-08-01: 10 mg via ORAL
  Filled 2021-08-01: qty 1

## 2021-08-01 MED ORDER — ACETAMINOPHEN 325 MG PO TABS
650.0000 mg | ORAL_TABLET | Freq: Once | ORAL | Status: AC
  Administered 2021-08-01: 650 mg via ORAL
  Filled 2021-08-01: qty 2

## 2021-08-01 MED ORDER — SODIUM CHLORIDE 0.9 % IV SOLN
510.0000 mg | Freq: Once | INTRAVENOUS | Status: AC
  Administered 2021-08-01: 510 mg via INTRAVENOUS
  Filled 2021-08-01: qty 510

## 2021-08-01 NOTE — Patient Instructions (Signed)
[facility]  Discharge Instructions: °Thank you for choosing [facility] to provide your oncology and hematology care.  °If you have a lab appointment with the Cancer Center, please come in thru the Main Entrance and check in at the main information desk. ° °Wear comfortable clothing and clothing appropriate for easy access to any Portacath or PICC line.  ° °We strive to give you quality time with your provider. You may need to reschedule your appointment if you arrive late (15 or more minutes).  Arriving late affects you and other patients whose appointments are after yours.  Also, if you miss three or more appointments without notifying the office, you may be dismissed from the clinic at the provider’s discretion.    °  °For prescription refill requests, have your pharmacy contact our office and allow 72 hours for refills to be completed.   ° °Today you received the following chemotherapy and/or immunotherapy agents Feraheme    °  °To help prevent nausea and vomiting after your treatment, we encourage you to take your nausea medication as directed. ° °BELOW ARE SYMPTOMS THAT SHOULD BE REPORTED IMMEDIATELY: °*FEVER GREATER THAN 100.4 F (38 °C) OR HIGHER °*CHILLS OR SWEATING °*NAUSEA AND VOMITING THAT IS NOT CONTROLLED WITH YOUR NAUSEA MEDICATION °*UNUSUAL SHORTNESS OF BREATH °*UNUSUAL BRUISING OR BLEEDING °*URINARY PROBLEMS (pain or burning when urinating, or frequent urination) °*BOWEL PROBLEMS (unusual diarrhea, constipation, pain near the anus) °TENDERNESS IN MOUTH AND THROAT WITH OR WITHOUT PRESENCE OF ULCERS (sore throat, sores in mouth, or a toothache) °UNUSUAL RASH, SWELLING OR PAIN  °UNUSUAL VAGINAL DISCHARGE OR ITCHING  ° °Items with * indicate a potential emergency and should be followed up as soon as possible or go to the Emergency Department if any problems should occur. ° °Please show the CHEMOTHERAPY ALERT CARD or IMMUNOTHERAPY ALERT CARD at check-in to the Emergency  Department and triage nurse. ° °Should you have questions after your visit or need to cancel or reschedule your appointment, please contact [facility] [phone]  and follow the prompts.  Office hours are 8:00 a.m. to 4:30 p.m. Monday - Friday. Please note that voicemails left after 4:00 p.m. may not be returned until the following business day.  We are closed weekends and major holidays. You have access to a nurse at all times for urgent questions. Please call the main number to the clinic [phone] and follow the prompts. ° °For any non-urgent questions, you may also contact your provider using MyChart. We now offer e-Visits for anyone 18 and older to request care online for non-urgent symptoms. For details visit [email or web address]. °  °Also download the MyChart app! Go to the app store, search "MyChart", open the app, select [facility], and log in with your MyChart username and password. ° °Due to Covid, a mask is required upon entering the hospital/clinic. If you do not have a mask, one will be given to you upon arrival. For doctor visits, patients may have 1 support person aged 18 or older with them. For treatment visits, patients cannot have anyone with them due to current Covid guidelines and our immunocompromised population.  °

## 2021-08-01 NOTE — Progress Notes (Signed)
Patient presents today for Feraheme infusion per providers order.  Vital signs WNL.  Patient has no new complaints at this time.    Peripheral IV started and blood return noted pre and post infusion.  Feraheme infusion  given today per MD orders.  Stable during infusion without adverse affects.  Vital signs stable.  No complaints at this time.  Discharge from clinic ambulatory in stable condition.  Alert and oriented X 3.  Follow up with [facility] as scheduled.  

## 2021-08-04 ENCOUNTER — Encounter (HOSPITAL_COMMUNITY): Payer: Self-pay | Admitting: Hematology

## 2021-08-04 ENCOUNTER — Other Ambulatory Visit (HOSPITAL_COMMUNITY): Payer: Self-pay

## 2021-08-04 DIAGNOSIS — D649 Anemia, unspecified: Secondary | ICD-10-CM | POA: Diagnosis not present

## 2021-08-04 DIAGNOSIS — M79605 Pain in left leg: Secondary | ICD-10-CM | POA: Diagnosis not present

## 2021-08-04 DIAGNOSIS — M79604 Pain in right leg: Secondary | ICD-10-CM | POA: Diagnosis not present

## 2021-08-04 DIAGNOSIS — Z87891 Personal history of nicotine dependence: Secondary | ICD-10-CM | POA: Insufficient documentation

## 2021-08-04 DIAGNOSIS — N189 Chronic kidney disease, unspecified: Secondary | ICD-10-CM | POA: Insufficient documentation

## 2021-08-04 DIAGNOSIS — D472 Monoclonal gammopathy: Secondary | ICD-10-CM | POA: Diagnosis not present

## 2021-08-04 DIAGNOSIS — R5383 Other fatigue: Secondary | ICD-10-CM | POA: Diagnosis not present

## 2021-08-04 DIAGNOSIS — D508 Other iron deficiency anemias: Secondary | ICD-10-CM

## 2021-08-04 DIAGNOSIS — E538 Deficiency of other specified B group vitamins: Secondary | ICD-10-CM | POA: Insufficient documentation

## 2021-08-04 LAB — OCCULT BLOOD X 1 CARD TO LAB, STOOL
Fecal Occult Bld: NEGATIVE
Fecal Occult Bld: NEGATIVE
Fecal Occult Bld: POSITIVE — AB

## 2021-08-08 NOTE — Progress Notes (Signed)
? ?[facility] ?[address] ?[place], [place] [address] ? ? ?CLINIC:  ?Medical Oncology/Hematology ? ?PCP:  ?[name], MD ?[place] ?[place] [address] ?[phone] ? ? ?REASON FOR VISIT:  ?Follow-up for normocytic anemia and MGUS ? ?CURRENT THERAPY: Under work-up ? ?INTERVAL HISTORY:  ?Bradley Sheppard 81 y.o. 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  He was last seen by Tarri Abernethy PA-C on 07/28/2021. ? ?At today's visit, he reports that he has not had any major changes since his office visit 2 weeks ago. ?He does report some mildly improved energy after his first IV iron infusion, with energy about 60%. ?He continues to have dyspnea on exertion. ?He has occasional black stools.  He denies any frank rectal bleeding. ?He denies any pica, lightheadedness, or syncope.   ?He continues to take iron tablet twice daily. ?He reports bilateral leg pain described as a burning pain.  He has never been diagnosed with any neuropathy.   ?He denies any fever, chills, night sweats, unintentional weight loss. ? ?He has 60% energy and 100% appetite. He endorses that he is maintaining a stable weight. ? ? ?REVIEW OF SYSTEMS:  ?Review of Systems  ?Constitutional:  Positive for fatigue. Negative for appetite change, chills, diaphoresis, fever and unexpected weight change.  ?HENT:   Negative for lump/mass and nosebleeds.   ?Eyes:  Negative for eye problems.  ?Respiratory:  Positive for shortness of breath (with exertion). Negative for cough and hemoptysis.   ?Cardiovascular:  Negative for chest pain, leg swelling and palpitations.  ?Gastrointestinal:  Negative for abdominal pain, blood in stool, constipation, diarrhea, nausea and vomiting.  ?Genitourinary:  Negative for hematuria.   ?Musculoskeletal:  Positive for arthralgias.  ?Skin: Negative.   ?Neurological:  Positive for numbness. Negative for dizziness, headaches and light-headedness.  ?Hematological:  Does not bruise/bleed easily.    ? ? ?PAST MEDICAL/SURGICAL HISTORY:  ?Past Medical History:  ?Diagnosis Date  ? Anxiety   ? Arthritis   ? Asthma   ? Chronic pain   ? CKD (chronic kidney disease) stage 3, GFR 30-59 ml/min (HCC)   ? Coronary atherosclerosis of native coronary artery   ? Multvessel, DES to LAD and RCA 8/03; EF 65% by echo 11/2014  ? DM2 (diabetes mellitus, type 2) ([place])   ? Essential hypertension   ? Gastritis   ? Related to NSAIDs  ? GERD (gastroesophageal reflux disease)   ? Iron deficiency anemia   ? Iron deficiency anemia 07/28/2021  ? Mitral valve regurgitation   ? Mixed hyperlipidemia   ? Nephrolithiasis   ? ?Past Surgical History:  ?Procedure Laterality Date  ? APPENDECTOMY    ? CARDIAC CATHETERIZATION  01/2002  ? 90% obstruction in proximal LAD, 60 % obstruction in proximal circumflex and 70%  in proximal RCA. LAD & RCA stented  ? L knee replacement    ? Subsequent infectino requiring resectino arthroplasty with incision and drainage 8/10, and reimplantizathion arthroplasty, 9/20  ? ? ? ?SOCIAL HISTORY:  ?Social History  ? ?Socioeconomic History  ? Marital status: Married  ?  Spouse name: Not on file  ? Number of children: Not on file  ? Years of education: Not on file  ? Highest education level: Not on file  ?Occupational History  ? Not on file  ?Tobacco Use  ? Smoking status: Former  ?  Types: Cigarettes  ? Smokeless tobacco: Current  ?  Types: Chew  ?Substance and Sexual Activity  ?  Alcohol use: No  ? Drug use: No  ? Sexual activity: Not on file  ?Other Topics Concern  ? Not on file  ?Social History Narrative  ? Full time- textiles  ?   ?   ? ?Social Determinants of Health  ? ?Financial Resource Strain: Not on file  ?Food Insecurity: Not on file  ?Transportation Needs: Not on file  ?Physical Activity: Not on file  ?Stress: Not on file  ?Social Connections: Not on file  ?Intimate Partner Violence: Not on file  ? ? ?FAMILY HISTORY:  ?Family History  ?Problem Relation Age of Onset  ? Stroke Other   ? Diabetes Mother   ?  Aneurysm Mother   ?     Brain  ? Stroke Father   ? ? ?CURRENT MEDICATIONS:  ?Outpatient Encounter Medications as of 08/11/2021  ?Medication Sig Note  ? amLODipine (NORVASC) 10 MG tablet Take 10 mg by mouth daily.   ? aspirin 81 MG tablet Take 81 mg by mouth daily.   ? carvedilol (COREG) 12.5 MG tablet Take 18.75 mg by mouth 2 (two) times daily.   ? chlorthalidone (HYGROTON) 25 MG tablet Take 25 mg by mouth daily.   ? glipiZIDE (GLUCOTROL XL) 10 MG 24 hr tablet Take 10 mg by mouth 2 (two) times daily.   ? IRON PO Take 1 tablet by mouth 2 (two) times daily.   ? LORazepam (ATIVAN) 1 MG tablet Take 1 mg by mouth at bedtime.   ? metFORMIN (GLUMETZA) 500 MG (MOD) 24 hr tablet Take 1,000 mg by mouth. Take 2 tabs in the morning and 1 tab at night   ? nitroGLYCERIN (NITROSTAT) 0.4 MG SL tablet Place 1 tablet (0.4 mg total) under the tongue every 5 (five) minutes as needed.   ? pioglitazone (ACTOS) 45 MG tablet Take 45 mg by mouth daily.   ? ramipril (ALTACE) 10 MG tablet Take 10 mg by mouth 2 (two) times daily.   ? rosuvastatin (CRESTOR) 20 MG tablet Take 20 mg by mouth daily.   ? Semaglutide (OZEMPIC, 0.25 OR 0.5 MG/DOSE, [place]) Inject 0.25 mLs into the skin once a week.   ? tiZANidine (ZANAFLEX) 2 MG tablet  02/20/2016: Received from: External Pharmacy  ? valsartan (DIOVAN) 320 MG tablet Take 320 mg by mouth daily.   ? ?No facility-administered encounter medications on file as of 08/11/2021.  ? ? ?ALLERGIES:  ?Allergies  ?Allergen Reactions  ? Prednisone   ?  "makes him feel faint"  ? ? ? ?PHYSICAL EXAM:  ?ECOG PERFORMANCE STATUS: 2 - Symptomatic, <50% confined to bed ? ?There were no vitals filed for this visit. ?There were no vitals filed for this visit. ?Physical Exam ?Constitutional:   ?   Appearance: Normal appearance.  ?   Comments: Presents in wheelchair  ?HENT:  ?   Head: Normocephalic and atraumatic.  ?   Ears:  ?   Comments: Hard of hearing ?   Mouth/Throat:  ?   Mouth: Mucous membranes are moist.  ?Eyes:  ?    Extraocular Movements: Extraocular movements intact.  ?   Pupils: Pupils are equal, round, and reactive to light.  ?Cardiovascular:  ?   Rate and Rhythm: Normal rate and regular rhythm.  ?   Pulses: Normal pulses.  ?   Heart sounds: Normal heart sounds.  ?Pulmonary:  ?   Effort: Pulmonary effort is normal.  ?   Breath sounds: Normal breath sounds.  ?Abdominal:  ?   General: Bowel sounds  are normal.  ?   Palpations: Abdomen is soft.  ?   Tenderness: There is no abdominal tenderness.  ?Musculoskeletal:     ?   General: No swelling.  ?   Right lower leg: Edema (2+ pitting edema, chronic) present.  ?   Left lower leg: Edema (2+ pitting edema, chronic) present.  ?Lymphadenopathy:  ?   Cervical: No cervical adenopathy.  ?Skin: ?   General: Skin is warm and dry.  ?Neurological:  ?   General: No focal deficit present.  ?   Mental Status: He is alert and oriented to person, place, and time.  ?Psychiatric:     ?   Mood and Affect: Mood normal.     ?   Behavior: Behavior normal.  ? ? ? ?LABORATORY DATA:  ?I have reviewed the labs as listed.  ?CBC ?   ?Component Value Date/Time  ? WBC 8.1 07/11/2021 1152  ? RBC 3.15 (L) 07/11/2021 1152  ? RBC 3.09 (L) 07/11/2021 1152  ? HGB 9.5 (L) 07/11/2021 1152  ? HCT 29.4 (L) 07/11/2021 1152  ? PLT 215 07/11/2021 1152  ? MCV 95.1 07/11/2021 1152  ? MCH 30.7 07/11/2021 1152  ? MCHC 32.3 07/11/2021 1152  ? RDW 14.0 07/11/2021 1152  ? LYMPHSABS 2.5 07/11/2021 1152  ? MONOABS 0.5 07/11/2021 1152  ? EOSABS 0.2 07/11/2021 1152  ? BASOSABS 0.0 07/11/2021 1152  ? ? ?  Latest Ref Rng & Units 07/28/2021  ?  1:58 PM 01/06/2009  ?  5:00 AM 01/05/2009  ?  5:20 AM  ?CMP  ?Glucose 70 - 99 mg/dL 234   94   140    ?BUN 8 - 23 mg/dL 40   10   11    ?Creatinine 0.61 - 1.24 mg/dL 1.53   0.89   0.82    ?Sodium 135 - 145 mmol/L 139   140   137    ?Potassium 3.5 - 5.1 mmol/L 4.3   4.1   4.4    ?Chloride 98 - 111 mmol/L 110   105   106    ?CO2 22 - 32 mmol/L '23   29   27    '$ ?Calcium 8.9 - 10.3 mg/dL 9.1   8.6   8.5     ?Total Protein 6.5 - 8.1 g/dL 7.6      ?Total Bilirubin 0.3 - 1.2 mg/dL 0.4      ?Alkaline Phos 38 - 126 U/L 65      ?AST 15 - 41 U/L 24      ?ALT 0 - 44 U/L 13      ? ? ?DIAGNOSTIC IMAGING:  ?I have independen

## 2021-08-11 ENCOUNTER — Inpatient Hospital Stay (HOSPITAL_COMMUNITY): Payer: PPO | Attending: Hematology | Admitting: Physician Assistant

## 2021-08-11 ENCOUNTER — Inpatient Hospital Stay (HOSPITAL_COMMUNITY): Payer: PPO

## 2021-08-11 VITALS — BP 133/63 | HR 58 | Temp 96.7°F | Resp 18 | Ht 72.0 in | Wt 198.2 lb

## 2021-08-11 DIAGNOSIS — D508 Other iron deficiency anemias: Secondary | ICD-10-CM | POA: Diagnosis not present

## 2021-08-11 DIAGNOSIS — N189 Chronic kidney disease, unspecified: Secondary | ICD-10-CM | POA: Diagnosis not present

## 2021-08-11 DIAGNOSIS — D472 Monoclonal gammopathy: Secondary | ICD-10-CM | POA: Diagnosis not present

## 2021-08-11 DIAGNOSIS — M899 Disorder of bone, unspecified: Secondary | ICD-10-CM

## 2021-08-11 MED ORDER — SODIUM CHLORIDE 0.9 % IV SOLN
510.0000 mg | Freq: Once | INTRAVENOUS | Status: AC
  Administered 2021-08-11: 510 mg via INTRAVENOUS
  Filled 2021-08-11: qty 17

## 2021-08-11 MED ORDER — LORATADINE 10 MG PO TABS
10.0000 mg | ORAL_TABLET | Freq: Once | ORAL | Status: AC
  Administered 2021-08-11: 10 mg via ORAL
  Filled 2021-08-11: qty 1

## 2021-08-11 MED ORDER — SODIUM CHLORIDE 0.9 % IV SOLN: Freq: Once | INTRAVENOUS | Status: AC

## 2021-08-11 MED ORDER — ACETAMINOPHEN 325 MG PO TABS
650.0000 mg | ORAL_TABLET | Freq: Once | ORAL | Status: AC
  Administered 2021-08-11: 650 mg via ORAL
  Filled 2021-08-11: qty 2

## 2021-08-11 NOTE — Progress Notes (Signed)
Patient presents today for Feraheme infusion and follow up appointment with R. Pennington PA. Vital signs stable. Patient denies any side effects from his last iron infusion. Patient has no complaints today. MAR reviewed.  ? ?Feraheme given today per MD orders. Tolerated infusion without adverse affects. Vital signs stable. No complaints at this time. Discharged from clinic by wheel chair in stable condition. Alert and oriented x 3. F/U with [facility] as scheduled.   ?

## 2021-08-11 NOTE — Patient Instructions (Signed)
[facility]  Discharge Instructions: °Thank you for choosing [facility] to provide your oncology and hematology care.  °If you have a lab appointment with the Cancer Center, please come in thru the Main Entrance and check in at the main information desk. ° °Wear comfortable clothing and clothing appropriate for easy access to any Portacath or PICC line.  ° °We strive to give you quality time with your provider. You may need to reschedule your appointment if you arrive late (15 or more minutes).  Arriving late affects you and other patients whose appointments are after yours.  Also, if you miss three or more appointments without notifying the office, you may be dismissed from the clinic at the provider’s discretion.    °  °For prescription refill requests, have your pharmacy contact our office and allow 72 hours for refills to be completed.   ° °Today you received the following chemotherapy and/or immunotherapy agents Feraheme    °  °To help prevent nausea and vomiting after your treatment, we encourage you to take your nausea medication as directed. ° °BELOW ARE SYMPTOMS THAT SHOULD BE REPORTED IMMEDIATELY: °*FEVER GREATER THAN 100.4 F (38 °C) OR HIGHER °*CHILLS OR SWEATING °*NAUSEA AND VOMITING THAT IS NOT CONTROLLED WITH YOUR NAUSEA MEDICATION °*UNUSUAL SHORTNESS OF BREATH °*UNUSUAL BRUISING OR BLEEDING °*URINARY PROBLEMS (pain or burning when urinating, or frequent urination) °*BOWEL PROBLEMS (unusual diarrhea, constipation, pain near the anus) °TENDERNESS IN MOUTH AND THROAT WITH OR WITHOUT PRESENCE OF ULCERS (sore throat, sores in mouth, or a toothache) °UNUSUAL RASH, SWELLING OR PAIN  °UNUSUAL VAGINAL DISCHARGE OR ITCHING  ° °Items with * indicate a potential emergency and should be followed up as soon as possible or go to the Emergency Department if any problems should occur. ° °Please show the CHEMOTHERAPY ALERT CARD or IMMUNOTHERAPY ALERT CARD at check-in to the Emergency  Department and triage nurse. ° °Should you have questions after your visit or need to cancel or reschedule your appointment, please contact [facility] [phone]  and follow the prompts.  Office hours are 8:00 a.m. to 4:30 p.m. Monday - Friday. Please note that voicemails left after 4:00 p.m. may not be returned until the following business day.  We are closed weekends and major holidays. You have access to a nurse at all times for urgent questions. Please call the main number to the clinic [phone] and follow the prompts. ° °For any non-urgent questions, you may also contact your provider using MyChart. We now offer e-Visits for anyone 18 and older to request care online for non-urgent symptoms. For details visit [email or web address]. °  °Also download the MyChart app! Go to the app store, search "MyChart", open the app, select [facility], and log in with your MyChart username and password. ° °Due to Covid, a mask is required upon entering the hospital/clinic. If you do not have a mask, one will be given to you upon arrival. For doctor visits, patients may have 1 support person aged 18 or older with them. For treatment visits, patients cannot have anyone with them due to current Covid guidelines and our immunocompromised population.  °

## 2021-08-11 NOTE — Patient Instructions (Signed)
[place] at [facility] ?Discharge Instructions ? ?You were seen today by Tarri Abernethy PA-C for your follow-up visit. ? ?Your labs and other tests show some findings that are concerning for possible cancer called multiple myeloma.  We will need to check some additional tests to see if you have this type of cancer (multiple myeloma): ?Bone marrow biopsy ?PET scan ?You will have a follow-up visit with Dr. Delton Coombes after these tests are completed to discuss results, your diagnosis, and next steps in your treatment. ? ?You have completed your IV iron treatments for now.  We will recheck your iron levels in June 2023. ? ?Your stool was positive for trace amounts of blood, which means you may be having some bleeding from your stomach or intestines.  We will refer you to see gastroenterologist (stomach doctor) in the next few months. ? ?Continue to take your B12 supplement.  We will check this again in about 6 months. ? ?-------------------------- ? ? ?Thank you for choosing [place] at [facility] to provide your oncology and hematology care.  To afford each patient quality time with our provider, please arrive at least 15 minutes before your scheduled appointment time.  ? ?If you have a lab appointment with the [facility] please come in thru the Main Entrance and check in at the main information desk. ? ?You need to re-schedule your appointment should you arrive 10 or more minutes late.  We strive to give you quality time with our providers, and arriving late affects you and other patients whose appointments are after yours.  Also, if you no show three or more times for appointments you may be dismissed from the clinic at the providers discretion.     ?Again, thank you for choosing [facility].  Our hope is that these requests will decrease the amount of time that you wait before being seen by our physicians.        ?_____________________________________________________________ ? ?Should you have questions after your visit to [facility], please contact our office at [phone] and follow the prompts.  Our office hours are 8:00 a.m. and 4:30 p.m. Monday - Friday.  Please note that voicemails left after 4:00 p.m. may not be returned until the following business day.  We are closed weekends and major holidays.  You do have access to a nurse 24-7, just call the main number to the clinic [phone] and do not press any options, hold on the line and a nurse will answer the phone.   ? ?For prescription refill requests, have your pharmacy contact our office and allow 72 hours.   ? ?Due to Covid, you will need to wear a mask upon entering the hospital. If you do not have a mask, a mask will be given to you at the Main Entrance upon arrival. For doctor visits, patients may have 1 support person age 79 or older with them. For treatment visits, patients can not have anyone with them due to social distancing guidelines and our immunocompromised population.  ? ? ? ?

## 2021-08-14 ENCOUNTER — Encounter: Payer: Self-pay | Admitting: Internal Medicine

## 2021-08-26 ENCOUNTER — Encounter (HOSPITAL_COMMUNITY): Payer: Self-pay | Admitting: Hematology

## 2021-08-28 ENCOUNTER — Encounter (HOSPITAL_COMMUNITY)
Admission: RE | Admit: 2021-08-28 | Discharge: 2021-08-28 | Disposition: A | Discharge status: Home / Self Care | Payer: PPO | Source: Ambulatory Visit | Attending: Physician Assistant | Admitting: Physician Assistant

## 2021-08-28 DIAGNOSIS — M899 Disorder of bone, unspecified: Secondary | ICD-10-CM | POA: Insufficient documentation

## 2021-08-28 DIAGNOSIS — C9000 Multiple myeloma not having achieved remission: Secondary | ICD-10-CM | POA: Diagnosis not present

## 2021-08-28 DIAGNOSIS — D472 Monoclonal gammopathy: Secondary | ICD-10-CM | POA: Insufficient documentation

## 2021-08-28 MED ORDER — FLUDEOXYGLUCOSE F - 18 (FDG) INJECTION
9.7000 | Freq: Once | INTRAVENOUS | Status: AC | PRN
  Administered 2021-08-28: 9.7 via INTRAVENOUS

## 2021-09-01 ENCOUNTER — Other Ambulatory Visit: Payer: Self-pay | Admitting: Radiology

## 2021-09-02 ENCOUNTER — Other Ambulatory Visit: Payer: Self-pay

## 2021-09-02 ENCOUNTER — Ambulatory Visit (HOSPITAL_COMMUNITY)
Admission: RE | Admit: 2021-09-02 | Discharge: 2021-09-02 | Disposition: A | Discharge status: Home / Self Care | Payer: PPO | Source: Ambulatory Visit | Attending: Physician Assistant | Admitting: Physician Assistant

## 2021-09-02 ENCOUNTER — Encounter (HOSPITAL_COMMUNITY): Payer: Self-pay

## 2021-09-02 DIAGNOSIS — E785 Hyperlipidemia, unspecified: Secondary | ICD-10-CM | POA: Insufficient documentation

## 2021-09-02 DIAGNOSIS — F419 Anxiety disorder, unspecified: Secondary | ICD-10-CM | POA: Diagnosis not present

## 2021-09-02 DIAGNOSIS — I129 Hypertensive chronic kidney disease with stage 1 through stage 4 chronic kidney disease, or unspecified chronic kidney disease: Secondary | ICD-10-CM | POA: Diagnosis not present

## 2021-09-02 DIAGNOSIS — M899 Disorder of bone, unspecified: Secondary | ICD-10-CM | POA: Insufficient documentation

## 2021-09-02 DIAGNOSIS — Z833 Family history of diabetes mellitus: Secondary | ICD-10-CM | POA: Diagnosis not present

## 2021-09-02 DIAGNOSIS — N183 Chronic kidney disease, stage 3 unspecified: Secondary | ICD-10-CM | POA: Insufficient documentation

## 2021-09-02 DIAGNOSIS — D509 Iron deficiency anemia, unspecified: Secondary | ICD-10-CM | POA: Insufficient documentation

## 2021-09-02 DIAGNOSIS — Z794 Long term (current) use of insulin: Secondary | ICD-10-CM | POA: Diagnosis not present

## 2021-09-02 DIAGNOSIS — D649 Anemia, unspecified: Secondary | ICD-10-CM | POA: Diagnosis not present

## 2021-09-02 DIAGNOSIS — H9190 Unspecified hearing loss, unspecified ear: Secondary | ICD-10-CM | POA: Insufficient documentation

## 2021-09-02 DIAGNOSIS — J45909 Unspecified asthma, uncomplicated: Secondary | ICD-10-CM | POA: Diagnosis not present

## 2021-09-02 DIAGNOSIS — D631 Anemia in chronic kidney disease: Secondary | ICD-10-CM | POA: Diagnosis not present

## 2021-09-02 DIAGNOSIS — I340 Nonrheumatic mitral (valve) insufficiency: Secondary | ICD-10-CM | POA: Diagnosis not present

## 2021-09-02 DIAGNOSIS — C9000 Multiple myeloma not having achieved remission: Secondary | ICD-10-CM | POA: Diagnosis not present

## 2021-09-02 DIAGNOSIS — E1140 Type 2 diabetes mellitus with diabetic neuropathy, unspecified: Secondary | ICD-10-CM | POA: Diagnosis not present

## 2021-09-02 DIAGNOSIS — Z7984 Long term (current) use of oral hypoglycemic drugs: Secondary | ICD-10-CM | POA: Insufficient documentation

## 2021-09-02 DIAGNOSIS — Z7985 Long-term (current) use of injectable non-insulin antidiabetic drugs: Secondary | ICD-10-CM | POA: Diagnosis not present

## 2021-09-02 DIAGNOSIS — R531 Weakness: Secondary | ICD-10-CM | POA: Insufficient documentation

## 2021-09-02 DIAGNOSIS — E782 Mixed hyperlipidemia: Secondary | ICD-10-CM | POA: Diagnosis not present

## 2021-09-02 DIAGNOSIS — I2510 Atherosclerotic heart disease of native coronary artery without angina pectoris: Secondary | ICD-10-CM | POA: Diagnosis not present

## 2021-09-02 DIAGNOSIS — K2970 Gastritis, unspecified, without bleeding: Secondary | ICD-10-CM | POA: Diagnosis not present

## 2021-09-02 DIAGNOSIS — C8519 Unspecified B-cell lymphoma, extranodal and solid organ sites: Secondary | ICD-10-CM | POA: Diagnosis not present

## 2021-09-02 DIAGNOSIS — M1990 Unspecified osteoarthritis, unspecified site: Secondary | ICD-10-CM | POA: Insufficient documentation

## 2021-09-02 DIAGNOSIS — Z955 Presence of coronary angioplasty implant and graft: Secondary | ICD-10-CM | POA: Diagnosis not present

## 2021-09-02 DIAGNOSIS — D472 Monoclonal gammopathy: Secondary | ICD-10-CM | POA: Diagnosis not present

## 2021-09-02 DIAGNOSIS — Z87442 Personal history of urinary calculi: Secondary | ICD-10-CM | POA: Diagnosis not present

## 2021-09-02 DIAGNOSIS — K219 Gastro-esophageal reflux disease without esophagitis: Secondary | ICD-10-CM | POA: Insufficient documentation

## 2021-09-02 DIAGNOSIS — M898X8 Other specified disorders of bone, other site: Secondary | ICD-10-CM | POA: Diagnosis not present

## 2021-09-02 DIAGNOSIS — E1122 Type 2 diabetes mellitus with diabetic chronic kidney disease: Secondary | ICD-10-CM | POA: Diagnosis not present

## 2021-09-02 DIAGNOSIS — R600 Localized edema: Secondary | ICD-10-CM | POA: Insufficient documentation

## 2021-09-02 LAB — CBC WITH DIFFERENTIAL/PLATELET
Abs Immature Granulocytes: 0.02 10*3/uL (ref 0.00–0.07)
Basophils Absolute: 0 10*3/uL (ref 0.0–0.1)
Basophils Relative: 1 %
Eosinophils Absolute: 0.2 10*3/uL (ref 0.0–0.5)
Eosinophils Relative: 2 %
HCT: 32.8 % — ABNORMAL LOW (ref 39.0–52.0)
Hemoglobin: 10.7 g/dL — ABNORMAL LOW (ref 13.0–17.0)
Immature Granulocytes: 0 %
Lymphocytes Relative: 37 %
Lymphs Abs: 2.7 10*3/uL (ref 0.7–4.0)
MCH: 30.7 pg (ref 26.0–34.0)
MCHC: 32.6 g/dL (ref 30.0–36.0)
MCV: 94 fL (ref 80.0–100.0)
Monocytes Absolute: 0.6 10*3/uL (ref 0.1–1.0)
Monocytes Relative: 9 %
Neutro Abs: 3.7 10*3/uL (ref 1.7–7.7)
Neutrophils Relative %: 51 %
Platelets: 226 10*3/uL (ref 150–400)
RBC: 3.49 MIL/uL — ABNORMAL LOW (ref 4.22–5.81)
RDW: 15.3 % (ref 11.5–15.5)
WBC: 7.2 10*3/uL (ref 4.0–10.5)
nRBC: 0 % (ref 0.0–0.2)

## 2021-09-02 LAB — GLUCOSE, CAPILLARY: Glucose-Capillary: 92 mg/dL (ref 70–99)

## 2021-09-02 MED ORDER — NALOXONE HCL 0.4 MG/ML IJ SOLN
INTRAMUSCULAR | Status: AC
  Filled 2021-09-02: qty 1

## 2021-09-02 MED ORDER — FENTANYL CITRATE (PF) 100 MCG/2ML IJ SOLN
INTRAMUSCULAR | Status: AC | PRN
  Administered 2021-09-02: 25 ug via INTRAVENOUS

## 2021-09-02 MED ORDER — MIDAZOLAM HCL 2 MG/2ML IJ SOLN
INTRAMUSCULAR | Status: AC
  Filled 2021-09-02: qty 2

## 2021-09-02 MED ORDER — LIDOCAINE HCL 1 % IJ SOLN
INTRAMUSCULAR | Status: AC | PRN
  Administered 2021-09-02: 10 mL via INTRADERMAL

## 2021-09-02 MED ORDER — FLUMAZENIL 0.5 MG/5ML IV SOLN
INTRAVENOUS | Status: DC
Start: 2021-09-02 — End: 2021-09-02
  Filled 2021-09-02: qty 5

## 2021-09-02 MED ORDER — FENTANYL CITRATE (PF) 100 MCG/2ML IJ SOLN
INTRAMUSCULAR | Status: AC
  Filled 2021-09-02: qty 2

## 2021-09-02 MED ORDER — MIDAZOLAM HCL 2 MG/2ML IJ SOLN
INTRAMUSCULAR | Status: AC | PRN
  Administered 2021-09-02: .5 mg via INTRAVENOUS

## 2021-09-02 MED ORDER — SODIUM CHLORIDE 0.9 % IV SOLN: INTRAVENOUS | Status: DC

## 2021-09-02 NOTE — Consult Note (Signed)
? ?Chief Complaint: ?Patient was seen in consultation today for CT guided bone marrow biopsy ? ?Referring Physician(s): ?Katragadda,S ? ?Supervising Physician: Michaelle Birks ? ?Patient Status: [place] ? ?History of Present Illness: ?Bradley Sheppard is an 81 y.o. 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  He presents today for CT-guided bone marrow biopsy for further evaluation/rule out myeloma. ? ?Past Medical History:  ?Diagnosis Date  ? Anxiety   ? Arthritis   ? Asthma   ? Chronic pain   ? CKD (chronic kidney disease) stage 3, GFR 30-59 ml/min (HCC)   ? Coronary atherosclerosis of native coronary artery   ? Multvessel, DES to LAD and RCA 8/03; EF 65% by echo 11/2014  ? DM2 (diabetes mellitus, type 2) ([employer])   ? Essential hypertension   ? Gastritis   ? Related to NSAIDs  ? GERD (gastroesophageal reflux disease)   ? Iron deficiency anemia   ? Iron deficiency anemia 07/28/2021  ? Mitral valve regurgitation   ? Mixed hyperlipidemia   ? Nephrolithiasis   ? ? ?Past Surgical History:  ?Procedure Laterality Date  ? APPENDECTOMY    ? CARDIAC CATHETERIZATION  01/2002  ? 90% obstruction in proximal LAD, 60 % obstruction in proximal circumflex and 70%  in proximal RCA. LAD & RCA stented  ? L knee replacement    ? Subsequent infectino requiring resectino arthroplasty with incision and drainage 8/10, and reimplantizathion arthroplasty, 9/20  ? ? ?Allergies: ?Prednisone ? ?Medications: ?Prior to Admission medications   ?Medication Sig Start Date End Date Taking? Authorizing Provider  ?amLODipine (NORVASC) 10 MG tablet Take 10 mg by mouth daily. 06/13/21  Yes [provider]  ?aspirin 81 MG tablet Take 81 mg by mouth daily.   Yes [provider]  ?carvedilol (COREG) 12.5 MG tablet Take 18.75 mg by mouth 2 (two) times daily. 12/19/20  Yes [provider]  ?chlorthalidone (HYGROTON) 25 MG tablet Take 25 mg by mouth daily.   Yes [provider]  ?glipiZIDE (GLUCOTROL XL) 10 MG 24 hr tablet Take 10 mg by mouth 2 (two) times daily. 12/19/20  Yes [provider]  ?IRON PO Take 1 tablet by mouth 2 (two) times daily.   Yes [provider]  ?Lancets (ONETOUCH DELICA PLUS [id number]) [place] Apply topically. 08/07/21  Yes [provider]  ?LORazepam (ATIVAN) 1 MG tablet Take 1 mg by mouth at bedtime.   Yes [provider]  ?metFORMIN (GLUMETZA) 500 MG (MOD) 24 hr tablet Take 1,000 mg by mouth. Take 2 tabs in the morning and 1 tab at night   Yes [provider]  ?[facility] VERIO test strip 2 (two) times daily. use for testing 08/07/21  Yes [provider]  ?pioglitazone (ACTOS) 45 MG tablet Take 45 mg by mouth daily.   Yes [provider]  ?ramipril (ALTACE) 10 MG tablet Take 10 mg by mouth 2 (two) times daily.   Yes [provider]  ?rosuvastatin (CRESTOR) 20 MG tablet Take 20 mg by mouth daily.   Yes [provider]  ?Semaglutide (OZEMPIC, 0.25 OR 0.5 MG/DOSE, [place]) Inject 0.25 mLs into the skin once a week.   Yes [provider]  ?tiZANidine (ZANAFLEX) 2 MG tablet  01/17/16  Yes [provider]  ?valsartan (DIOVAN) 320 MG  tablet Take 320 mg by mouth daily. 12/19/20  Yes [provider]  ?nitroGLYCERIN (NITROSTAT) 0.4 MG SL tablet Place 1 tablet (0.4 mg total) under the tongue every 5 (five) minutes as needed. 06/30/21   Satira Sark, MD  ?  ? ?Family History  ?Problem Relation Age of Onset  ? Stroke Other   ? Diabetes Mother   ? Aneurysm Mother   ?     Brain  ? Stroke Father   ? ? ?Social History  ? ?Socioeconomic History  ? Marital status: Married  ?  Spouse name: Not on file  ? Number of children: Not on file  ? Years of education: Not on file  ?  Highest education level: Not on file  ?Occupational History  ? Not on file  ?Tobacco Use  ? Smoking status: Former  ?  Types: Cigarettes  ? Smokeless tobacco: Current  ?  Types: Chew  ?Substance and Sexual Activity  ? Alcohol use: No  ? Drug use: No  ? Sexual activity: Not on file  ?Other Topics Concern  ? Not on file  ?Social History Narrative  ? Full time- textiles  ?   ?   ? ?Social Determinants of Health  ? ?Financial Resource Strain: Not on file  ?Food Insecurity: Not on file  ?Transportation Needs: Not on file  ?Physical Activity: Not on file  ?Stress: Not on file  ?Social Connections: Not on file  ? ? ? ? ?Review of Systems currently denies fever, headache, chest pain, dyspnea, cough, back pain, nausea, vomiting or visible bleeding.  He does have visional abdominal/leg/shoulder discomfort; he does have mobility issues-lower extremity weakness-neuropathy and uses a walker to assist with ambulation.  Hard of hearing. ? ?Vital Signs: ?BP (!) 164/80   Pulse 68   Temp 97.6 ?F (36.4 ?C) (Oral)   Resp 18   SpO2 100%  ? ?Physical Exam awake, answering simple questions okay; chest clear to auscultation bilaterally.  Heart with regular rate and rhythm.  Abdomen soft, positive bowel sounds, some mild diffuse tenderness to palpation.  Bilateral pretibial edema noted. ? ?Imaging: ?NM PET Image Initial (PI) Whole Body ? ?Result Date: 08/29/2021 ?CLINICAL DATA:  Initial treatment strategy for multiple myeloma. EXAM: NUCLEAR MEDICINE PET WHOLE BODY TECHNIQUE: 9.7 mCi F-18 FDG was injected intravenously. Full-ring PET imaging was performed from the head to foot after the radiotracer. CT data was obtained and used for attenuation correction and anatomic localization. Fasting blood glucose: 109 mg/dl COMPARISON:  Radiographic bone survey dated 08/01/2021 FINDINGS: Mediastinal blood pool activity: SUV max 1.9 HEAD/NECK: No significant abnormal hypermetabolic activity in this region. The small lucencies noted on radiography  appear to involve the inner table of the calvarial vertex and seem more consistent with benign arachnoid granulations rather than myelomatous lesions. Incidental CT findings: Questionable hypodensity inferiorly in the left frontal lobe for example on image 37 series 3, probably beam hardening artifact, underlying lesion difficult completely exclude. Correlate with patient history in determining whether dedicated imaging of the brain is recommended. Bilateral common carotid atherosclerotic calcification. CHEST: No significant abnormal hypermetabolic activity in this region. Incidental CT findings: Coronary, aortic arch, and branch vessel atherosclerotic vascular disease. Mild cardiomegaly. Aortic valve calcification. Nonspecific subpleural reticulation in both lungs, fibrosis not excluded. ABDOMEN/PELVIS: Abnormal accentuated metabolic activity in the left side of the lower rectum extending to the anorectal junction, appearance concerning for possible tumor although peristaltic activity is not totally excluded. Maximum SUV 34.5. Scattered physiologic activity in large and  small bowel. Incidental CT findings: Substantial atherosclerosis of the abdominal aorta and its branches including the proximal celiac trunk. Sigmoid colon diverticulosis. SKELETON: Accentuated activity associated with these severe arthropathy of both glenohumeral joints. Accentuated activity along some of the margins of the femoral component of the left total knee prosthesis, likely benign. No hypermetabolic lytic lesions characteristic of active myeloma identified. No hypermetabolic activity in the left proximal radial diaphysis discourse spine to the tiny lucent lesions suggested on radiography. No definite hypermetabolic activity along the calvarial vertex. Incidental CT findings: Substantial levoconvex thoracolumbar scoliosis with rotary component. Considerable right greater than left degenerative hip arthropathy. Severe glenohumeral  arthropathy is noted above. EXTREMITIES: No significant abnormal hypermetabolic activity in this region. Incidental CT findings: Lipoma along the margin of the right biceps, image 156 series 3. IMPRESSION: 1. Promine

## 2021-09-02 NOTE — Procedures (Signed)
Vascular and Interventional Radiology Procedure Note ? ?Patient: Bradley Sheppard ?DOB: 1940/09/18 ?Medical Record Number: [id number] ?Note Date/Time: 09/02/21 9:23 AM  ? ?Performing Physician: Michaelle Birks, MD ?Assistant(s): None ? ?Diagnosis: MM ? ?Procedure: BONE MARROW ASPIRATION and BIOPSY ? ?Anesthesia: Conscious Sedation ?Complications: None ?Estimated Blood Loss: Minimal ?Specimens: Sent for Pathology ? ?Findings:  ?Successful CT-guided bone marrow biopsy ?A total of 2 cores were obtained. ?Hemostasis of the tract was achieved using Manual Pressure. ? ?Plan: Bed rest for 1 hours. ? ?See detailed procedure note with images in PACS. ?The patient tolerated the procedure well without incident or complication and was returned to Recovery in stable condition.  ? ? ?Michaelle Birks, MD ?[facility] ?[facility] ? ? ?Pager. [phone] ?Clinic. [phone]  ?

## 2021-09-02 NOTE — Discharge Instructions (Signed)

## 2021-09-04 LAB — SURGICAL PATHOLOGY

## 2021-09-09 ENCOUNTER — Encounter: Payer: Self-pay | Admitting: Gastroenterology

## 2021-09-09 ENCOUNTER — Ambulatory Visit (INDEPENDENT_AMBULATORY_CARE_PROVIDER_SITE_OTHER): Payer: PPO | Admitting: Gastroenterology

## 2021-09-09 VITALS — BP 110/60 | HR 68 | Temp 97.8°F | Ht 72.0 in | Wt 204.0 lb

## 2021-09-09 DIAGNOSIS — R948 Abnormal results of function studies of other organs and systems: Secondary | ICD-10-CM | POA: Insufficient documentation

## 2021-09-09 DIAGNOSIS — D509 Iron deficiency anemia, unspecified: Secondary | ICD-10-CM | POA: Diagnosis not present

## 2021-09-09 DIAGNOSIS — R195 Other fecal abnormalities: Secondary | ICD-10-CM | POA: Diagnosis not present

## 2021-09-09 DIAGNOSIS — K6289 Other specified diseases of anus and rectum: Secondary | ICD-10-CM | POA: Diagnosis not present

## 2021-09-09 NOTE — Patient Instructions (Signed)
On exam today, you have blood in the stool and I can feel abnormal area in the rectum that may correspond with the abnormal area on the PET scan.  ?Keep appointment with Dr. Delton Coombes for next week. I will be in touch with you after that appointment and after I have communicated findings from today's visit with him. I anticipate the need to move forward with a colonoscopy in the near future.  ?

## 2021-09-09 NOTE — Progress Notes (Signed)
? ? ? ?GI Office Note   ? ?Referring Provider: [name], MD ?Primary Care Physician:  [name], MD  ?Primary Gastroenterologist: Garfield Cornea, MD ? ? ?Chief Complaint  ? ?Chief Complaint  ?Patient presents with  ? Anemia  ? ? ? ?History of Present Illness  ? ?Bradley Sheppard is a 81 y.o. male presenting today with request of Tarri Abernethy PA-C for further evaluation of IDA (has received IV iron in the past).  Patient has a history of normocytic anemia and MGUS, previously seen by hematology at [facility], recently transferring care to [facility].  ? ?Labs from March 2023 showed hemoglobin 9.5 (2 years ago was 10.8), hematocrit 29.4, MCV 95.1, iron 55, TIBC 406, iron saturations 14%, ferritin 13.  B12 263, elevated methylmalonic acid of 643, folate 11.5.  Hemoglobin last week was 10.7. Occult blood positive stool.   ? 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 Recent work up with M-spike, [employer], IgG kappa.Metabolic bone survey March 2022: 4 rounded lucencies in the calvarium and 1 possible rounded lucency in the proximal left radial diaphysis concerning for lytic lesions consistent with history of possible myeloma. PET scan April 2023.  Prominent accentuated metabolic activity in the left lower rectum and anorectal region, malignancy not excluded.  No findings of active hypermetabolic lytic lesion in the skeleton to indicate active myeloma.Bone marrow biopsy completed Sep 02, 2021, patient follows up with Dr. Delton Coombes for results next week. 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 Also receiving iv iron infusions.  ? ?Today:  ambulates with walker. Biggest complaints for him are fatigue and limited mobility. 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 Right knee has been given him a fit for a long time. Has had home PT. Has had few falls in the past. Using a regular walker and doing better with stability.   ? ?From GI standpoint, he has had some issues with postprandial stools, can have incontinence. Gets urge to go but can't get there in time. Sometimes feels like has to have BM but then doesn't. BM every day to every few days. No hard stools. Rarely has to take a laxatives. Stools are "black" but he says he was told it was from oral iron. Has been on oral iron for one year and stools black for that length of time. No brbpr. No Pepto use. No heartburn usually unless drinks OJ. No dysphagia. No abdominal pain. Feels weak. No lightheadedness or dizzy. Back in March, DOE with PT, walking inside the house. His weight has been stable over the last one year.  ? ?He states he has not used NSAIDs since he had an ulcer on EGD by Dr. Lindalou Hose years ago.  ? ?  ?EGD January 2010: ?-Small hiatal hernia ? ?Colonoscopy January 2010: ?-Prep suboptimal but adequate ?-Left-sided diverticula ?-Sigmoid polyps removed, tubular adenoma and hyperplastic polyps. ?-Redundant colon ?-Normal TI ?-Surveillance colonoscopy January 2017 ? ? ? ?Medications  ? ?Current Outpatient Medications  ?Medication Sig Dispense Refill  ? amLODipine (NORVASC) 10 MG tablet Take 10 mg by mouth daily.    ? aspirin 81 MG tablet Take 81 mg by mouth daily.    ? carvedilol (COREG) 12.5 MG tablet Take 18.75 mg by mouth 2 (two) times daily.    ? chlorthalidone (HYGROTON) 25 MG tablet Take 25 mg by mouth daily.    ? glipiZIDE (GLUCOTROL XL) 10 MG 24 hr tablet Take 10 mg by mouth 2 (  two) times daily.    ? IRON PO Take 1 tablet by mouth 2 (two) times daily.    ? Lancets (ONETOUCH DELICA PLUS [id number]) [place] Apply topically.    ? LORazepam (ATIVAN) 1 MG tablet Take 1 mg by mouth at bedtime.    ? metFORMIN (GLUMETZA) 500 MG (MOD) 24 hr tablet Take 1,000 mg by mouth. Take 2 tabs in the morning and 1 tab at night    ? nitroGLYCERIN (NITROSTAT) 0.4 MG SL tablet Place 1 tablet (0.4 mg total) under the tongue every 5 (five) minutes as needed. 25 tablet 3  ? ONETOUCH VERIO test  strip 2 (two) times daily. use for testing    ? pioglitazone (ACTOS) 45 MG tablet Take 45 mg by mouth daily.    ? ramipril (ALTACE) 10 MG tablet Take 10 mg by mouth 2 (two) times daily.    ? rosuvastatin (CRESTOR) 20 MG tablet Take 20 mg by mouth daily.    ? Semaglutide (OZEMPIC, 0.25 OR 0.5 MG/DOSE, [place]) Inject 0.25 mLs into the skin once a week.    ? tiZANidine (ZANAFLEX) 2 MG tablet     ? valsartan (DIOVAN) 320 MG tablet Take 320 mg by mouth daily.    ? ?No current facility-administered medications for this visit.  ? ? ?Allergies  ? ?Allergies as of 09/09/2021 - Review Complete 09/09/2021  ?Allergen Reaction Noted  ? Prednisone  06/18/2020  ? ? ?Past Medical History  ? ?Past Medical History:  ?Diagnosis Date  ? Anxiety   ? Arthritis   ? Asthma   ? Chronic pain   ? CKD (chronic kidney disease) stage 3, GFR 30-59 ml/min (HCC)   ? Coronary atherosclerosis of native coronary artery   ? Multvessel, DES to LAD and RCA 8/03; EF 65% by echo 11/2014  ? DM2 (diabetes mellitus, type 2) ([place])   ? Essential hypertension   ? Gastritis   ? Related to NSAIDs  ? GERD (gastroesophageal reflux disease)   ? Iron deficiency anemia   ? Iron deficiency anemia 07/28/2021  ? Mitral valve regurgitation   ? Mixed hyperlipidemia   ? Nephrolithiasis   ? ? ?Past Surgical History  ? ?Past Surgical History:  ?Procedure Laterality Date  ? APPENDECTOMY    ? CARDIAC CATHETERIZATION  01/2002  ? 90% obstruction in proximal LAD, 60 % obstruction in proximal circumflex and 70%  in proximal RCA. LAD & RCA stented  ? L knee replacement    ? Subsequent infectino requiring resectino arthroplasty with incision and drainage 8/10, and reimplantizathion arthroplasty, 9/20  ? ? ?Past Family History  ? ?Family History  ?Problem Relation Age of Onset  ? Stroke Other   ? Diabetes Mother   ? Aneurysm Mother   ?     Brain  ? Stroke Father   ? ? ?Past Social History  ? ?Social History  ? ?Socioeconomic History  ? Marital status: Married  ?  Spouse name: Not on file  ?  Number of children: Not on file  ? Years of education: Not on file  ? Highest education level: Not on file  ?Occupational History  ? Not on file  ?Tobacco Use  ? Smoking status: Former  ?  Types: Cigarettes  ? Smokeless tobacco: Current  ?  Types: Chew  ?Substance and Sexual Activity  ? Alcohol use: No  ? Drug use: No  ? Sexual activity: Not Currently  ?Other Topics Concern  ? Not on file  ?Social History Narrative  ?  Full time- textiles  ?   ?   ? ?Social Determinants of Health  ? ?Financial Resource Strain: Not on file  ?Food Insecurity: Not on file  ?Transportation Needs: Not on file  ?Physical Activity: Not on file  ?Stress: Not on file  ?Social Connections: Not on file  ?Intimate Partner Violence: Not on file  ? ? ?Review of Systems  ? ? ?General: Negative for anorexia, weight loss, fever, chills. See hpi ?Eyes: Negative for vision changes.  ?ENT: Negative for hoarseness, difficulty swallowing , nasal congestion. ?CV: Negative for chest pain, angina, palpitations, dyspnea on exertion, peripheral edema.  ?Respiratory: Negative for dyspnea at rest, dyspnea on exertion, cough, sputum, wheezing.  ?GI: See history of present illness. ?GU:  Negative for dysuria, hematuria, urinary incontinence, urinary frequency, nocturnal urination.  ?MS: joint pain + especially knees. Negative for low back pain.  ?Derm: Negative for rash or itching.  ?Neuro: Negative for weakness, abnormal sensation, seizure, frequent headaches, memory loss,  ?confusion. See hpi ?Psych: Negative for anxiety, depression, suicidal ideation, hallucinations.  ?Endo: Negative for unusual weight change.  ?Heme: Negative for bruising or bleeding. ?Allergy: Negative for rash or hives. ? ?Physical Exam  ? ?BP 110/60 (BP Location: Right Arm, Patient Position: Sitting, Cuff Size: Large)   Pulse 68   Temp 97.8 ?F (36.6 ?C) (Oral)   Ht 6' (1.829 m)   Wt 204 lb (92.5 kg)   SpO2 99%   BMI 27.67 kg/m?  ?  ?General: Frail male, hard of hearing, in no acute  distress. Accompanied by wife.  ?Head: Normocephalic, atraumatic.   ?Eyes: Conjunctiva pale, no icterus. ?Mouth: Oropharyngeal mucosa moist and pink , no lesions erythema or exudate. ?Neck: Supple without thyromega

## 2021-09-09 NOTE — H&P (View-Only) (Signed)
GI Office Note    Referring Provider: [name], MD Primary Care Physician:  [name], MD  Primary Gastroenterologist: Garfield Cornea, MD   Chief Complaint   Chief Complaint  Patient presents with   Anemia     History of Present Illness   Bradley Sheppard is a 81 y.o. male presenting today with request of Tarri Abernethy PA-C for further evaluation of IDA (has received IV iron in the past).  Patient has a history of normocytic anemia and MGUS, previously seen by hematology at [facility], recently transferring care to [facility].   Labs from March 2023 showed hemoglobin 9.5 (2 years ago was 10.8), hematocrit 29.4, MCV 95.1, iron 55, TIBC 406, iron saturations 14%, ferritin 13.  B12 263, elevated methylmalonic acid of 643, folate 11.5.  Hemoglobin last week was 10.7. Occult blood positive stool.    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 Recent work up with M-spike, [employer], IgG kappa.Metabolic bone survey March 2022: 4 rounded lucencies in the calvarium and 1 possible rounded lucency in the proximal left radial diaphysis concerning for lytic lesions consistent with history of possible myeloma. PET scan April 2023.  Prominent accentuated metabolic activity in the left lower rectum and anorectal region, malignancy not excluded.  No findings of active hypermetabolic lytic lesion in the skeleton to indicate active myeloma.Bone marrow biopsy completed Sep 02, 2021, patient follows up with Dr. Delton Coombes for results next week. 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 Also receiving iv iron infusions.   Today:  ambulates with walker. Biggest complaints for him are fatigue and limited mobility. 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 Right knee has been given him a fit for a long time. Has had home PT. Has had few falls in the past. Using a regular walker and doing better with stability.    From GI standpoint, he has had some issues with postprandial stools, can have incontinence. Gets urge to go but can't get there in time. Sometimes feels like has to have BM but then doesn't. BM every day to every few days. No hard stools. Rarely has to take a laxatives. Stools are "black" but he says he was told it was from oral iron. Has been on oral iron for one year and stools black for that length of time. No brbpr. No Pepto use. No heartburn usually unless drinks OJ. No dysphagia. No abdominal pain. Feels weak. No lightheadedness or dizzy. Back in March, DOE with PT, walking inside the house. His weight has been stable over the last one year.   He states he has not used NSAIDs since he had an ulcer on EGD by Dr. Lindalou Hose years ago.     EGD January 2010: -Small hiatal hernia  Colonoscopy January 2010: -Prep suboptimal but adequate -Left-sided diverticula -Sigmoid polyps removed, tubular adenoma and hyperplastic polyps. -Redundant colon -Normal TI -Surveillance colonoscopy January 2017    Medications   Current Outpatient Medications  Medication Sig Dispense Refill   amLODipine (NORVASC) 10 MG tablet Take 10 mg by mouth daily.     aspirin 81 MG tablet Take 81 mg by mouth daily.     carvedilol (COREG) 12.5 MG tablet Take 18.75 mg by mouth 2 (two) times daily.     chlorthalidone (HYGROTON) 25 MG tablet Take 25 mg by mouth daily.     glipiZIDE (GLUCOTROL XL) 10 MG 24 hr tablet Take 10 mg by mouth 2 (  two) times daily.     IRON PO Take 1 tablet by mouth 2 (two) times daily.     Lancets (ONETOUCH DELICA PLUS [id number]) MISC Apply topically.     LORazepam (ATIVAN) 1 MG tablet Take 1 mg by mouth at bedtime.     metFORMIN (GLUMETZA) 500 MG (MOD) 24 hr tablet Take 1,000 mg by mouth. Take 2 tabs in the morning and 1 tab at night     nitroGLYCERIN (NITROSTAT) 0.4 MG SL tablet Place 1 tablet (0.4 mg total) under the tongue every 5 (five) minutes as needed. 25 tablet 3   ONETOUCH VERIO test  strip 2 (two) times daily. use for testing     pioglitazone (ACTOS) 45 MG tablet Take 45 mg by mouth daily.     ramipril (ALTACE) 10 MG tablet Take 10 mg by mouth 2 (two) times daily.     rosuvastatin (CRESTOR) 20 MG tablet Take 20 mg by mouth daily.     Semaglutide (OZEMPIC, 0.25 OR 0.5 MG/DOSE, [place]) Inject 0.25 mLs into the skin once a week.     tiZANidine (ZANAFLEX) 2 MG tablet      valsartan (DIOVAN) 320 MG tablet Take 320 mg by mouth daily.     No current facility-administered medications for this visit.    Allergies   Allergies as of 09/09/2021 - Review Complete 09/09/2021  Allergen Reaction Noted   Prednisone  06/18/2020    Past Medical History   Past Medical History:  Diagnosis Date   Anxiety    Arthritis    Asthma    Chronic pain    CKD (chronic kidney disease) stage 3, GFR 30-59 ml/min (HCC)    Coronary atherosclerosis of native coronary artery    Multvessel, DES to LAD and RCA 8/03; EF 65% by echo 11/2014   DM2 (diabetes mellitus, type 2) ([place])    Essential hypertension    Gastritis    Related to NSAIDs   GERD (gastroesophageal reflux disease)    Iron deficiency anemia    Iron deficiency anemia 07/28/2021   Mitral valve regurgitation    Mixed hyperlipidemia    Nephrolithiasis     Past Surgical History   Past Surgical History:  Procedure Laterality Date   APPENDECTOMY     CARDIAC CATHETERIZATION  01/2002   90% obstruction in proximal LAD, 60 % obstruction in proximal circumflex and 70%  in proximal RCA. LAD & RCA stented   L knee replacement     Subsequent infectino requiring resectino arthroplasty with incision and drainage 8/10, and reimplantizathion arthroplasty, 9/20    Past Family History   Family History  Problem Relation Age of Onset   Stroke Other    Diabetes Mother    Aneurysm Mother        Brain   Stroke Father     Past Social History   Social History   Socioeconomic History   Marital status: Married    Spouse name: Not on file    Number of children: Not on file   Years of education: Not on file   Highest education level: Not on file  Occupational History   Not on file  Tobacco Use   Smoking status: Former    Types: Cigarettes   Smokeless tobacco: Current    Types: Chew  Substance and Sexual Activity   Alcohol use: No   Drug use: No   Sexual activity: Not Currently  Other Topics Concern   Not on file  Social History Narrative  Full time- textiles         Social Determinants of Health   Financial Resource Strain: Not on file  Food Insecurity: Not on file  Transportation Needs: Not on file  Physical Activity: Not on file  Stress: Not on file  Social Connections: Not on file  Intimate Partner Violence: Not on file    Review of Systems    General: Negative for anorexia, weight loss, fever, chills. See hpi Eyes: Negative for vision changes.  ENT: Negative for hoarseness, difficulty swallowing , nasal congestion. CV: Negative for chest pain, angina, palpitations, dyspnea on exertion, peripheral edema.  Respiratory: Negative for dyspnea at rest, dyspnea on exertion, cough, sputum, wheezing.  GI: See history of present illness. GU:  Negative for dysuria, hematuria, urinary incontinence, urinary frequency, nocturnal urination.  MS: joint pain + especially knees. Negative for low back pain.  Derm: Negative for rash or itching.  Neuro: Negative for weakness, abnormal sensation, seizure, frequent headaches, memory loss,  confusion. See hpi Psych: Negative for anxiety, depression, suicidal ideation, hallucinations.  Endo: Negative for unusual weight change.  Heme: Negative for bruising or bleeding. Allergy: Negative for rash or hives.  Physical Exam   BP 110/60 (BP Location: Right Arm, Patient Position: Sitting, Cuff Size: Large)   Pulse 68   Temp 97.8 F (36.6 C) (Oral)   Ht 6' (1.829 m)   Wt 204 lb (92.5 kg)   SpO2 99%   BMI 27.67 kg/m    General: Frail male, hard of hearing, in no acute  distress. Accompanied by wife.  Head: Normocephalic, atraumatic.   Eyes: Conjunctiva pale, no icterus. Mouth: Oropharyngeal mucosa moist and pink , no lesions erythema or exudate. Neck: Supple without thyromegaly, masses, or lymphadenopathy.  Lungs: Clear to auscultation bilaterally.  Heart: Regular rate and rhythm, no rubs or gallops. 2/6 SEM Abdomen: Bowel sounds are normal, nontender, nondistended, no hepatosplenomegaly or masses,  no abdominal bruits or hernia, no rebound or guarding.   Rectal: no external lesions. Upon entering the anal canal patient had discomfort making exam difficult, on the left lateral side, there appeared to be polypoid mass palpated. Secretions are dark and heme +.  Extremities: 1-2+ bilateral lower extremity edema. Limited mobility of left knee.   Neuro: Alert and oriented x 4 , grossly normal neurologically.  Skin: Warm and dry, no rash or jaundice.   Psych: Alert and cooperative, normal mood and affect.  Labs   Lab Results  Component Value Date   WBC 7.2 09/02/2021   HGB 10.7 (L) 09/02/2021   HCT 32.8 (L) 09/02/2021   MCV 94.0 09/02/2021   PLT 226 09/02/2021   Lab Results  Component Value Date   CREATININE 1.53 (H) 07/28/2021   BUN 40 (H) 07/28/2021   NA 139 07/28/2021   K 4.3 07/28/2021   CL 110 07/28/2021   CO2 23 07/28/2021   Lab Results  Component Value Date   ALT 13 07/28/2021   AST 24 07/28/2021   ALKPHOS 65 07/28/2021   BILITOT 0.4 07/28/2021   Lab Results  Component Value Date   IRON 55 07/11/2021   TIBC 406 07/11/2021   FERRITIN 13 (L) 07/11/2021   Lab Results  Component Value Date   VITAMINB12 263 07/11/2021   Lab Results  Component Value Date   FOLATE 11.5 07/11/2021    Imaging Studies   NM PET Image Initial (PI) Whole Body  Result Date: 08/29/2021 CLINICAL DATA:  Initial treatment strategy for multiple myeloma. EXAM: NUCLEAR MEDICINE  PET WHOLE BODY TECHNIQUE: 9.7 mCi F-18 FDG was injected intravenously. Full-ring  PET imaging was performed from the head to foot after the radiotracer. CT data was obtained and used for attenuation correction and anatomic localization. Fasting blood glucose: 109 mg/dl COMPARISON:  Radiographic bone survey dated 08/01/2021 FINDINGS: Mediastinal blood pool activity: SUV max 1.9 HEAD/NECK: No significant abnormal hypermetabolic activity in this region. The small lucencies noted on radiography appear to involve the inner table of the calvarial vertex and seem more consistent with benign arachnoid granulations rather than myelomatous lesions. Incidental CT findings: Questionable hypodensity inferiorly in the left frontal lobe for example on image 37 series 3, probably beam hardening artifact, underlying lesion difficult completely exclude. Correlate with patient history in determining whether dedicated imaging of the brain is recommended. Bilateral common carotid atherosclerotic calcification. CHEST: No significant abnormal hypermetabolic activity in this region. Incidental CT findings: Coronary, aortic arch, and branch vessel atherosclerotic vascular disease. Mild cardiomegaly. Aortic valve calcification. Nonspecific subpleural reticulation in both lungs, fibrosis not excluded. ABDOMEN/PELVIS: Abnormal accentuated metabolic activity in the left side of the lower rectum extending to the anorectal junction, appearance concerning for possible tumor although peristaltic activity is not totally excluded. Maximum SUV 34.5. Scattered physiologic activity in large and small bowel. Incidental CT findings: Substantial atherosclerosis of the abdominal aorta and its branches including the proximal celiac trunk. Sigmoid colon diverticulosis. SKELETON: Accentuated activity associated with these severe arthropathy of both glenohumeral joints. Accentuated activity along some of the margins of the femoral component of the left total knee prosthesis, likely benign. No hypermetabolic lytic lesions characteristic of  active myeloma identified. No hypermetabolic activity in the left proximal radial diaphysis discourse spine to the tiny lucent lesions suggested on radiography. No definite hypermetabolic activity along the calvarial vertex. Incidental CT findings: Substantial levoconvex thoracolumbar scoliosis with rotary component. Considerable right greater than left degenerative hip arthropathy. Severe glenohumeral arthropathy is noted above. EXTREMITIES: No significant abnormal hypermetabolic activity in this region. Incidental CT findings: Lipoma along the margin of the right biceps, image 156 series 3. IMPRESSION: 1. Prominent accentuated metabolic activity in the left lower rectum and anorectal region. Malignancy is not excluded. Assessment for lower rectal malignancy by digital rectal exam and/or endoscopy recommended. 2. No findings of active hypermetabolic lytic lesion in the skeleton to indicate active myeloma. 3. Hypodensity inferiorly in the left frontal lobe is probably from beam hardening artifact on the CT data. An underlying lesion is considered less likely, but if patient has unexplained neurologic symptoms or if otherwise clinically warranted, dedicated brain imaging by dedicated CT or MRI might be considered. 4. Extensive atherosclerosis including the abdominal aorta, coronary arteries, and aortic branches including the celiac trunk. Aortic Atherosclerosis (ICD10-I70.0). 5. Nonspecific subpleural reticulation in both lungs, potentially from fibrosis. 6. 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 7. Levoconvex thoracolumbar scoliosis with rotary component. 8. 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 Electronically Signed   By: Van Clines M.D.   On: 08/29/2021 09:00   CT Biopsy  Result Date: 09/02/2021 INDICATION: Multiple myeloma. EXAM: CT GUIDED BONE MARROW ASPIRATION AND CORE BIOPSY RADIATION DOSE REDUCTION: This exam was performed according to the  departmental dose-optimization program which includes automated exposure control, adjustment of the mA and/or kV according to patient size and/or use of iterative reconstruction technique. MEDICATIONS: None. ANESTHESIA/SEDATION: Moderate (conscious) sedation was employed during this procedure. A total of 1 milligrams versed and 50 micrograms fentanyl were administered intravenously. The patient's level of consciousness and vital signs were  monitored continuously by radiology nursing throughout the procedure under my direct supervision. Total monitored sedation time: 19 minutes FLUOROSCOPY TIME:  CT dose in mGy was not provided. COMPLICATIONS: None immediate. Estimated blood loss: <5 mL PROCEDURE: Informed written consent was obtained from the the patient and/or patient's representative after a thorough discussion of the procedural risks, benefits and alternatives. All questions were addressed. Maximal Sterile Barrier Technique was utilized including caps, mask, sterile gowns, sterile gloves, sterile drape, hand hygiene and skin antiseptic. A timeout was performed prior to the initiation of the procedure. The patient was positioned prone and non-contrast localization CT was performed of the pelvis to demonstrate the iliac marrow spaces. Maximal barrier sterile technique utilized including caps, mask, sterile gowns, sterile gloves, large sterile drape, hand hygiene, and chlorhexidine prep. Under sterile conditions and local anesthesia, an 11 gauge coaxial bone biopsy needle was advanced into the RIGHT iliac marrow space. Needle position was confirmed with CT imaging. Initially, bone marrow aspiration was performed. Next, the 11 gauge outer cannula was utilized to obtain a 2 iliac bone marrow core biopsy. Needle was removed. Hemostasis was obtained with compression. The patient tolerated the procedure well. Samples were prepared with the cytotechnologist. IMPRESSION: Successful CT-guided bone marrow aspiration and  biopsy, as above. Michaelle Birks, MD [facility] Electronically Signed   By: Michaelle Birks M.D.   On: 09/02/2021 17:44   CT BONE MARROW BIOPSY & ASPIRATION  Result Date: 09/02/2021 INDICATION: Multiple myeloma. EXAM: CT GUIDED BONE MARROW ASPIRATION AND CORE BIOPSY RADIATION DOSE REDUCTION: This exam was performed according to the departmental dose-optimization program which includes automated exposure control, adjustment of the mA and/or kV according to patient size and/or use of iterative reconstruction technique. MEDICATIONS: None. ANESTHESIA/SEDATION: Moderate (conscious) sedation was employed during this procedure. A total of 1 milligrams versed and 50 micrograms fentanyl were administered intravenously. The patient's level of consciousness and vital signs were monitored continuously by radiology nursing throughout the procedure under my direct supervision. Total monitored sedation time: 19 minutes FLUOROSCOPY TIME:  CT dose in mGy was not provided. COMPLICATIONS: None immediate. Estimated blood loss: <5 mL PROCEDURE: Informed written consent was obtained from the the patient and/or patient's representative after a thorough discussion of the procedural risks, benefits and alternatives. All questions were addressed. Maximal Sterile Barrier Technique was utilized including caps, mask, sterile gowns, sterile gloves, sterile drape, hand hygiene and skin antiseptic. A timeout was performed prior to the initiation of the procedure. The patient was positioned prone and non-contrast localization CT was performed of the pelvis to demonstrate the iliac marrow spaces. Maximal barrier sterile technique utilized including caps, mask, sterile gowns, sterile gloves, large sterile drape, hand hygiene, and chlorhexidine prep. Under sterile conditions and local anesthesia, an 11 gauge coaxial bone biopsy needle was advanced into the RIGHT iliac marrow space. Needle  position was confirmed with CT imaging. Initially, bone marrow aspiration was performed. Next, the 11 gauge outer cannula was utilized to obtain a 2 iliac bone marrow core biopsy. Needle was removed. Hemostasis was obtained with compression. The patient tolerated the procedure well. Samples were prepared with the cytotechnologist. IMPRESSION: Successful CT-guided bone marrow aspiration and biopsy, as above. Michaelle Birks, MD [facility] Electronically Signed   By: Michaelle Birks M.D.   On: 09/02/2021 17:44    Assessment   Very pleasant 81 year old male with multiple comorbidities including stage III chronic kidney disease, CAD status post remote stents, diabetes, mild calcific  aortic stenosis presenting for further evaluation of IDA, heme positive stools, dark stools on iron, abnormal PET scan involving the rectum/anal rectal region.  Patient followed by Dr. Delton Coombes recently (previously hematology/oncology in [place]) with history of normocytic anemia requiring iron infusions, prior history of MGUS/IgG kappa with suspicious lytic lesions on skeletal survey recently completing PET scan and bone marrow biopsy as outlined above.  Patient has not received bone marrow biopsy results yet, scheduled for follow-up next week with Dr. Delton Coombes.  IDA/heme positive stool/dark stools on iron: Patient has been on oral iron for more than a year per his report.  He has received several iron infusions.  Stools are Hemoccult positive.  Recent PET scan with prominent accentuated metabolic activity in the left lower rectum and anorectal region, malignancy not excluded. Rectal exam concerning for possible mass/polypoid lesion.  Patient noted change in bowels and difficulty controlling stools.    PLAN   Anticipate colonoscopy in the near future to further evaluate iron deficiency, heme positive stool, possible rectal mass, abnormal PET scan of the rectum.  Patient would  like to see Dr. Delton Coombes regarding bone marrow biopsy results before pursuing but he is open to colonoscopy as needed.   Laureen Ochs. Bobby Rumpf, [place], [facility]

## 2021-09-10 ENCOUNTER — Encounter (HOSPITAL_COMMUNITY): Payer: Self-pay | Admitting: Hematology

## 2021-09-11 ENCOUNTER — Encounter (HOSPITAL_COMMUNITY): Payer: Self-pay | Admitting: Hematology

## 2021-09-15 ENCOUNTER — Encounter (HOSPITAL_COMMUNITY): Payer: Self-pay | Admitting: Hematology

## 2021-09-16 ENCOUNTER — Inpatient Hospital Stay (HOSPITAL_COMMUNITY): Payer: PPO | Attending: Hematology | Admitting: Hematology

## 2021-09-16 VITALS — BP 125/57 | HR 71 | Temp 98.3°F | Resp 18 | Ht 72.0 in | Wt 203.5 lb

## 2021-09-16 DIAGNOSIS — N189 Chronic kidney disease, unspecified: Secondary | ICD-10-CM | POA: Diagnosis not present

## 2021-09-16 DIAGNOSIS — R195 Other fecal abnormalities: Secondary | ICD-10-CM | POA: Diagnosis not present

## 2021-09-16 DIAGNOSIS — E538 Deficiency of other specified B group vitamins: Secondary | ICD-10-CM | POA: Diagnosis not present

## 2021-09-16 DIAGNOSIS — G8929 Other chronic pain: Secondary | ICD-10-CM | POA: Diagnosis not present

## 2021-09-16 DIAGNOSIS — D649 Anemia, unspecified: Secondary | ICD-10-CM | POA: Insufficient documentation

## 2021-09-16 DIAGNOSIS — C884 Extranodal marginal zone b-cell lymphoma of mucosa-associated lymphoid tissue (malt-lymphoma) not having achieved remission: Secondary | ICD-10-CM | POA: Insufficient documentation

## 2021-09-16 DIAGNOSIS — R197 Diarrhea, unspecified: Secondary | ICD-10-CM | POA: Diagnosis not present

## 2021-09-16 DIAGNOSIS — D472 Monoclonal gammopathy: Secondary | ICD-10-CM | POA: Diagnosis not present

## 2021-09-16 DIAGNOSIS — Z87891 Personal history of nicotine dependence: Secondary | ICD-10-CM | POA: Insufficient documentation

## 2021-09-16 DIAGNOSIS — M25561 Pain in right knee: Secondary | ICD-10-CM | POA: Insufficient documentation

## 2021-09-16 DIAGNOSIS — M79604 Pain in right leg: Secondary | ICD-10-CM | POA: Insufficient documentation

## 2021-09-16 DIAGNOSIS — R5383 Other fatigue: Secondary | ICD-10-CM | POA: Diagnosis not present

## 2021-09-16 NOTE — Patient Instructions (Signed)
[place] at [facility] ?Discharge Instructions ? ? ?You were seen and examined today by Dr. Delton Coombes. ? ?He reviewed the results of your bone marrow biopsy. It shows low grade lymphoma. ? ?We will refer you for colonoscopy and biopsy of rectal mass. ? ?We will refer you to a bone doctor for right hip and knee pain.  ? ?We will see you back in 4 weeks.  ? ? ?Thank you for choosing [place] at [facility] to provide your oncology and hematology care.  To afford each patient quality time with our provider, please arrive at least 15 minutes before your scheduled appointment time.  ? ?If you have a lab appointment with the [facility] please come in thru the Main Entrance and check in at the main information desk. ? ?You need to re-schedule your appointment should you arrive 10 or more minutes late.  We strive to give you quality time with our providers, and arriving late affects you and other patients whose appointments are after yours.  Also, if you no show three or more times for appointments you may be dismissed from the clinic at the providers discretion.     ?Again, thank you for choosing [facility].  Our hope is that these requests will decrease the amount of time that you wait before being seen by our physicians.       ?_____________________________________________________________ ? ?Should you have questions after your visit to [facility], please contact our office at [phone] and follow the prompts.  Our office hours are 8:00 a.m. and 4:30 p.m. Monday - Friday.  Please note that voicemails left after 4:00 p.m. may not be returned until the following business day.  We are closed weekends and major holidays.  You do have access to a nurse 24-7, just call the main number to the clinic [phone] and do not press any options, hold on the line and a nurse will answer the phone.   ? ?For prescription refill requests, have your  pharmacy contact our office and allow 72 hours.   ? ?Due to Covid, you will need to wear a mask upon entering the hospital. If you do not have a mask, a mask will be given to you at the Main Entrance upon arrival. For doctor visits, patients may have 1 support person age 65 or older with them. For treatment visits, patients can not have anyone with them due to social distancing guidelines and our immunocompromised population.  ? ?   ?

## 2021-09-16 NOTE — Progress Notes (Signed)
? ?[facility] ?[address] ?[place], [place] [address] ? ? ?CLINIC:  ?Medical Oncology/Hematology ? ?PCP:  ?[name], MD ?[place] / [place] [address]  ?[phone] ? ?REASON FOR VISIT:  ?Follow-up for normocytic anemia and MGUS ? ?PRIOR THERAPY: none ? ?CURRENT THERAPY: under work-up ? ?INTERVAL HISTORY:  ?Bradley Sheppard, a 81 y.o. 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 Bradley Sheppard was last seen on 07/11/2021. ? ?Today he reports feeling well. He reports burning pain in his shoulders as wells as his right knee and leg. He also reports diarrhea. His stools is dark and grey, and he denies hematochezia.  ? ?REVIEW OF SYSTEMS:  ?Review of Systems  ?Constitutional:  Positive for fatigue. Negative for appetite change.  ?Cardiovascular:  Positive for leg swelling (feet).  ?Gastrointestinal:  Positive for diarrhea.  ?Musculoskeletal:  Positive for arthralgias (8/10 shoulder).  ?Neurological:  Positive for numbness (fingers @ bedtime).  ?Psychiatric/Behavioral:  Positive for sleep disturbance.   ?All other systems reviewed and are negative. ? ?PAST MEDICAL/SURGICAL HISTORY:  ?Past Medical History:  ?Diagnosis Date  ? Anxiety   ? Arthritis   ? Asthma   ? Chronic pain   ? CKD (chronic kidney disease) stage 3, GFR 30-59 ml/min (HCC)   ? Coronary atherosclerosis of native coronary artery   ? Multvessel, DES to LAD and RCA 8/03; EF 65% by echo 11/2014  ? DM2 (diabetes mellitus, type 2) ([place])   ? Essential hypertension   ? Gastritis   ? Related to NSAIDs  ? GERD (gastroesophageal reflux disease)   ? Iron deficiency anemia   ? Iron deficiency anemia 07/28/2021  ? Mitral valve regurgitation   ? Mixed hyperlipidemia   ? Nephrolithiasis   ? ?Past Surgical History:  ?Procedure Laterality Date  ? APPENDECTOMY    ? CARDIAC CATHETERIZATION  01/2002  ? 90% obstruction in proximal LAD, 60 % obstruction in proximal circumflex and 70%  in proximal RCA. LAD & RCA stented  ? L knee  replacement    ? Subsequent infectino requiring resectino arthroplasty with incision and drainage 8/10, and reimplantizathion arthroplasty, 9/20. 5 total surgeries.  ? ? ?SOCIAL HISTORY:  ?Social History  ? ?Socioeconomic History  ? Marital status: Married  ?  Spouse name: Not on file  ? Number of children: Not on file  ? Years of education: Not on file  ? Highest education level: Not on file  ?Occupational History  ? Not on file  ?Tobacco Use  ? Smoking status: Former  ?  Types: Cigarettes  ? Smokeless tobacco: Current  ?  Types: Chew  ?Substance and Sexual Activity  ? Alcohol use: No  ? Drug use: No  ? Sexual activity: Not Currently  ?Other Topics Concern  ? Not on file  ?Social History Narrative  ? Full time- [employer]iles  ?   ?   ? ?Social Determinants of Health  ? ?Financial Resource Strain: Not on file  ?Food Insecurity: Not on file  ?Transportation Needs: Not on file  ?Physical Activity: Not on file  ?Stress: Not on file  ?Social Connections: Not on file  ?Intimate Partner Violence: Not on file  ? ? ?FAMILY HISTORY:  ?Family History  ?Problem Relation Age of Onset  ? Diabetes Mother   ? Aneurysm Mother   ?     Brain  ? Stroke Father   ? Stroke Other   ? Colon cancer Neg Hx   ? ? ?  CURRENT MEDICATIONS:  ?Current Outpatient Medications  ?Medication Sig Dispense Refill  ? amLODipine (NORVASC) 10 MG tablet Take 10 mg by mouth daily.    ? aspirin 81 MG tablet Take 81 mg by mouth daily.    ? carvedilol (COREG) 12.5 MG tablet Take 18.75 mg by mouth 2 (two) times daily.    ? chlorthalidone (HYGROTON) 25 MG tablet Take 25 mg by mouth daily.    ? glipiZIDE (GLUCOTROL XL) 10 MG 24 hr tablet Take 10 mg by mouth 2 (two) times daily.    ? IRON PO Take 1 tablet by mouth 2 (two) times daily.    ? Lancets (ONETOUCH DELICA PLUS [id number]) MISC Apply topically.    ? LORazepam (ATIVAN) 1 MG tablet Take 1 mg by mouth at bedtime.    ? metFORMIN (GLUMETZA) 500 MG (MOD) 24 hr tablet Take 1,000 mg by mouth. Take 2 tabs in the morning and  1 tab at night    ? nitroGLYCERIN (NITROSTAT) 0.4 MG SL tablet Place 1 tablet (0.4 mg total) under the tongue every 5 (five) minutes as needed. 25 tablet 3  ? ONETOUCH VERIO test strip 2 (two) times daily. use for testing    ? pioglitazone (ACTOS) 45 MG tablet Take 45 mg by mouth daily.    ? ramipril (ALTACE) 10 MG tablet Take 10 mg by mouth 2 (two) times daily.    ? rosuvastatin (CRESTOR) 20 MG tablet Take 20 mg by mouth daily.    ? Semaglutide (OZEMPIC, 0.25 OR 0.5 MG/DOSE, [place]) Inject 0.25 mLs into the skin once a week.    ? tiZANidine (ZANAFLEX) 2 MG tablet     ? valsartan (DIOVAN) 320 MG tablet Take 320 mg by mouth daily.    ? ?No current facility-administered medications for this visit.  ? ? ?ALLERGIES:  ?Allergies  ?Allergen Reactions  ? Prednisone   ?  "makes him feel faint"  ? ? ?PHYSICAL EXAM:  ?Performance status (ECOG): 2 - Symptomatic, <50% confined to bed ? ?There were no vitals filed for this visit. ?Wt Readings from Last 3 Encounters:  ?09/09/21 204 lb (92.5 kg)  ?08/11/21 198 lb 3.2 oz (89.9 kg)  ?07/28/21 207 lb 3.2 oz (94 kg)  ? ?Physical Exam ?Vitals reviewed.  ?Constitutional:   ?   Appearance: Normal appearance.  ?   Comments: In wheelchair  ?Cardiovascular:  ?   Rate and Rhythm: Normal rate and regular rhythm.  ?   Pulses: Normal pulses.  ?   Heart sounds: Normal heart sounds.  ?Pulmonary:  ?   Effort: Pulmonary effort is normal.  ?   Breath sounds: Normal breath sounds.  ?Neurological:  ?   General: No focal deficit present.  ?   Mental Status: He is alert and oriented to person, place, and time.  ?Psychiatric:     ?   Mood and Affect: Mood normal.     ?   Behavior: Behavior normal.  ? ? ?LABORATORY DATA:  ?I have reviewed the labs as listed.  ? ?  Latest Ref Rng & Units 09/02/2021  ?  7:57 AM 07/11/2021  ? 11:52 AM 01/07/2009  ?  4:45 AM  ?CBC  ?WBC 4.0 - 10.5 K/uL 7.2   8.1   5.6    ?Hemoglobin 13.0 - 17.0 g/dL 10.7   9.5   10.4 REPEATED TO VERIFY DELTA CHECK NOTED POST TRANSFUSION SPECIMEN     ?Hematocrit 39.0 - 52.0 % 32.8   29.4   30.2    ?  Platelets 150 - 400 K/uL 226   215   263    ? ? ?  Latest Ref Rng & Units 07/28/2021  ?  1:58 PM 01/06/2009  ?  5:00 AM 01/05/2009  ?  5:20 AM  ?CMP  ?Glucose 70 - 99 mg/dL 234   94   140    ?BUN 8 - 23 mg/dL 40   10   11    ?Creatinine 0.61 - 1.24 mg/dL 1.53   0.89   0.82    ?Sodium 135 - 145 mmol/L 139   140   137    ?Potassium 3.5 - 5.1 mmol/L 4.3   4.1   4.4    ?Chloride 98 - 111 mmol/L 110   105   106    ?CO2 22 - 32 mmol/L 23   29   27     ?Calcium 8.9 - 10.3 mg/dL 9.1   8.6   8.5    ?Total Protein 6.5 - 8.1 g/dL 7.6      ?Total Bilirubin 0.3 - 1.2 mg/dL 0.4      ?Alkaline Phos 38 - 126 U/L 65      ?AST 15 - 41 U/L 24      ?ALT 0 - 44 U/L 13      ? ?   ?Component Value Date/Time  ? RBC 3.49 (L) 09/02/2021 0757  ? MCV 94.0 09/02/2021 0757  ? MCH 30.7 09/02/2021 0757  ? MCHC 32.6 09/02/2021 0757  ? RDW 15.3 09/02/2021 0757  ? LYMPHSABS 2.7 09/02/2021 0757  ? MONOABS 0.6 09/02/2021 0757  ? EOSABS 0.2 09/02/2021 0757  ? BASOSABS 0.0 09/02/2021 0757  ? ? ?DIAGNOSTIC IMAGING:  ?I have independently reviewed the scans and discussed with the patient. ?NM PET Image Initial (PI) Whole Body ? ?Result Date: 08/29/2021 ?CLINICAL DATA:  Initial treatment strategy for multiple myeloma. EXAM: NUCLEAR MEDICINE PET WHOLE BODY TECHNIQUE: 9.7 mCi F-18 FDG was injected intravenously. Full-ring PET imaging was performed from the head to foot after the radiotracer. CT data was obtained and used for attenuation correction and anatomic localization. Fasting blood glucose: 109 mg/dl COMPARISON:  Radiographic bone survey dated 08/01/2021 FINDINGS: Mediastinal blood pool activity: SUV max 1.9 HEAD/NECK: No significant abnormal hypermetabolic activity in this region. The small lucencies noted on radiography appear to involve the inner table of the calvarial vertex and seem more consistent with benign arachnoid granulations rather than myelomatous lesions. Incidental CT findings: Questionable  hypodensity inferiorly in the left frontal lobe for example on image 37 series 3, probably beam hardening artifact, underlying lesion difficult completely exclude. Correlate with patient history in determining whet

## 2021-09-17 ENCOUNTER — Other Ambulatory Visit (HOSPITAL_COMMUNITY): Payer: Self-pay | Admitting: *Deleted

## 2021-09-17 ENCOUNTER — Telehealth: Payer: Self-pay | Admitting: Gastroenterology

## 2021-09-17 DIAGNOSIS — E538 Deficiency of other specified B group vitamins: Secondary | ICD-10-CM

## 2021-09-17 DIAGNOSIS — N1832 Chronic kidney disease, stage 3b: Secondary | ICD-10-CM

## 2021-09-17 DIAGNOSIS — D472 Monoclonal gammopathy: Secondary | ICD-10-CM

## 2021-09-17 MED ORDER — PEG 3350-KCL-NA BICARB-NACL 420 G PO SOLR: 4000.0000 mL | ORAL | 0 refills | Status: DC

## 2021-09-17 NOTE — Telephone Encounter (Signed)
Pre-op appt 10/21/21. Appt letter and procedure instructions mailed. ?

## 2021-09-17 NOTE — Telephone Encounter (Signed)
Routing to [employer] PA. ?

## 2021-09-17 NOTE — Telephone Encounter (Signed)
Called pt, spoke to him and wife, TCS scheduled for 10/23/21 (1st available) at 11:00am. Advised to hold iron and semaglutide for one week. Requested to be placed on cancellation list. Advised them we would call if we had cancellation in time for him to hold iron and semaglutide. Rx for prep sent to pharmacy. Orders entered. ?

## 2021-09-17 NOTE — Telephone Encounter (Addendum)
Please schedule colonoscopy with Dr. Gala Romney ASAP.  ASA 3. Need to be done to move forward with treatment options for lymphoma.  ? ?Dx: rectal mass, positive PET, new diagnosis of non-Hodgkin's lymphoma ? ?Hold iron one week. ?Hold semaglutide one week ?1.5 days of clear liquids ?Dulcolax '10mg'$  daily for 2 days before prep ?Needs trilyte due to renal issues, split all the day before.  ?Tap water enema.  ?Day of prep: glipizide '10mg'$  am only, metformin 1 tab in am only, actos 1/2 tablet. ?Am of prep: hold above ?

## 2021-09-17 NOTE — Telephone Encounter (Signed)
Patient has seen the cancer center physician and is now ready to go ahead with procedures  ?

## 2021-09-17 NOTE — Addendum Note (Signed)
Addended by: Hassan Rowan on: 09/17/2021 02:42 PM ? ? Modules accepted: Orders ? ?

## 2021-09-18 NOTE — Telephone Encounter (Signed)
Wife called back, pt did take iron this morning.   Dr. Gala Romney will it be ok for him to still have TCS 09/25/21? He was moved up few minutes ago and wife thought he had not taken iron for today.

## 2021-09-18 NOTE — Patient Instructions (Signed)
Bradley Sheppard  09/18/2021     '@PREFPERIOPPHARMACY'$ @   Your procedure is scheduled on  09/25/2021.   Report to [facility] at  0600  A.M.   Call this number if you have problems the morning of surgery:  [phone]   Remember:  Follow the diet and prep instructions given to you by the office.    Your last dose of iron and semaglutide should be 09/17/2021.     Take these medicines the morning of surgery with A SIP OF WATER                        amlodipine, carvedilol, zanaflex.     Do not wear jewelry, make-up or nail polish.  Do not wear lotions, powders, or perfumes, or deodorant.  Do not shave 48 hours prior to surgery.  Men may shave face and neck.  Do not bring valuables to the hospital.  [facility] is not responsible for any belongings or valuables.  Contacts, dentures or bridgework may not be worn into surgery.  Leave your suitcase in the car.  After surgery it may be brought to your room.  For patients admitted to the hospital, discharge time will be determined by your treatment team.  Patients discharged the day of surgery will not be allowed to drive home and must have someone with them for 24 hours.    Special instructions:   DO NOT smoke tobacco or vape for 24 hours before your procedure.  Please read over the following fact sheets that you were given. Anesthesia Post-op Instructions and Care and Recovery After Surgery      Colonoscopy, Adult, Care After The following information offers guidance on how to care for yourself after your procedure. Your health care provider may also give you more specific instructions. If you have problems or questions, contact your health care provider. What can I expect after the procedure? After the procedure, it is common to have: A small amount of blood in your stool for 24 hours after the procedure. Some gas. Mild cramping or bloating of your abdomen. Follow these instructions at home: Eating and  drinking  Drink enough fluid to keep your urine pale yellow. Follow instructions from your health care provider about eating or drinking restrictions. Resume your normal diet as told by your health care provider. Avoid heavy or fried foods that are hard to digest. Activity Rest as told by your health care provider. Avoid sitting for a long time without moving. Get up to take short walks every 1-2 hours. This is important to improve blood flow and breathing. Ask for help if you feel weak or unsteady. Return to your normal activities as told by your health care provider. Ask your health care provider what activities are safe for you. Managing cramping and bloating  Try walking around when you have cramps or feel bloated. If directed, apply heat to your abdomen as told by your health care provider. Use the heat source that your health care provider recommends, such as a moist heat pack or a heating pad. Place a towel between your skin and the heat source. Leave the heat on for 20-30 minutes. Remove the heat if your skin turns bright red. This is especially important if you are unable to feel pain, heat, or cold. You have a greater risk of getting burned. General instructions If you were given a sedative during the procedure, it can affect you  for several hours. Do not drive or operate machinery until your health care provider says that it is safe. For the first 24 hours after the procedure: Do not sign important documents. Do not drink alcohol. Do your regular daily activities at a slower pace than normal. Eat soft foods that are easy to digest. Take over-the-counter and prescription medicines only as told by your health care provider. Keep all follow-up visits. This is important. Contact a health care provider if: You have blood in your stool 2-3 days after the procedure. Get help right away if: You have more than a small spotting of blood in your stool. You have large blood clots in your  stool. You have swelling of your abdomen. You have nausea or vomiting. You have a fever. You have increasing pain in your abdomen that is not relieved with medicine. These symptoms may be an emergency. Get help right away. Call 911. Do not wait to see if the symptoms will go away. Do not drive yourself to the hospital. Summary After the procedure, it is common to have a small amount of blood in your stool. You may also have mild cramping and bloating of your abdomen. If you were given a sedative during the procedure, it can affect you for several hours. Do not drive or operate machinery until your health care provider says that it is safe. Get help right away if you have a lot of blood in your stool, nausea or vomiting, a fever, or increased pain in your abdomen. This information is not intended to replace advice given to you by your health care provider. Make sure you discuss any questions you have with your health care provider. Document Revised: 12/11/2020 Document Reviewed: 12/11/2020 Elsevier Patient Education  [place] After This sheet gives you information about how to care for yourself after your procedure. Your health care provider may also give you more specific instructions. If you have problems or questions, contact your health care provider. What can I expect after the procedure? After the procedure, it is common to have: Tiredness. Forgetfulness about what happened after the procedure. Impaired judgment for important decisions. Nausea or vomiting. Some difficulty with balance. Follow these instructions at home: For the time period you were told by your health care provider:     Rest as needed. Do not participate in activities where you could fall or become injured. Do not drive or use machinery. Do not drink alcohol. Do not take sleeping pills or medicines that cause drowsiness. Do not make important decisions or sign legal  documents. Do not take care of children on your own. Eating and drinking Follow the diet that is recommended by your health care provider. Drink enough fluid to keep your urine pale yellow. If you vomit: Drink water, juice, or soup when you can drink without vomiting. Make sure you have little or no nausea before eating solid foods. General instructions Have a responsible adult stay with you for the time you are told. It is important to have someone help care for you until you are awake and alert. Take over-the-counter and prescription medicines only as told by your health care provider. If you have sleep apnea, surgery and certain medicines can increase your risk for breathing problems. Follow instructions from your health care provider about wearing your sleep device: Anytime you are sleeping, including during daytime naps. While taking prescription pain medicines, sleeping medicines, or medicines that make you drowsy. Avoid smoking. Keep all  follow-up visits as told by your health care provider. This is important. Contact a health care provider if: You keep feeling nauseous or you keep vomiting. You feel light-headed. You are still sleepy or having trouble with balance after 24 hours. You develop a rash. You have a fever. You have redness or swelling around the IV site. Get help right away if: You have trouble breathing. You have new-onset confusion at home. Summary For several hours after your procedure, you may feel tired. You may also be forgetful and have poor judgment. Have a responsible adult stay with you for the time you are told. It is important to have someone help care for you until you are awake and alert. Rest as told. Do not drive or operate machinery. Do not drink alcohol or take sleeping pills. Get help right away if you have trouble breathing, or if you suddenly become confused. This information is not intended to replace advice given to you by your health care  provider. Make sure you discuss any questions you have with your health care provider. Document Revised: 03/25/2021 Document Reviewed: 03/23/2019 Elsevier Patient Education  [place].

## 2021-09-18 NOTE — Telephone Encounter (Signed)
Pre-op appt and procedure instructions faxed to [facility].

## 2021-09-18 NOTE — Telephone Encounter (Signed)
Called pt's wife, informed her ok to proceed with TCS. Informed her of pre-op appt 09/23/21 at 9:30am. Informed her he will need to take Dulcolax '10mg'$  09/22/21 and 09/23/21 and use tap water enema.

## 2021-09-18 NOTE — Telephone Encounter (Signed)
Had cancellation for procedure 09/25/21.  Called pt's wife, he hadn't taken iron today. Didn't take semaglutide yesterday d/t sugar being low. Last semaglutide injection was 09/10/21. TCS moved up to 09/25/21 at 7:30am. Advised her he should not take anymore iron or semaglutide until after procedure. Will fax new instructions to pharmacy for them to received when pick up prep. Endo scheduler informed.

## 2021-09-23 ENCOUNTER — Encounter (HOSPITAL_COMMUNITY)
Admission: RE | Admit: 2021-09-23 | Discharge: 2021-09-23 | Disposition: A | Discharge status: Home / Self Care | Payer: PPO | Source: Ambulatory Visit | Attending: Internal Medicine | Admitting: Internal Medicine

## 2021-09-23 ENCOUNTER — Encounter (HOSPITAL_COMMUNITY): Payer: Self-pay

## 2021-09-23 VITALS — BP 141/95 | HR 71 | Temp 98.3°F | Resp 18 | Ht 72.0 in | Wt 204.0 lb

## 2021-09-23 DIAGNOSIS — Z01812 Encounter for preprocedural laboratory examination: Secondary | ICD-10-CM | POA: Diagnosis not present

## 2021-09-23 DIAGNOSIS — D509 Iron deficiency anemia, unspecified: Secondary | ICD-10-CM | POA: Diagnosis not present

## 2021-09-23 DIAGNOSIS — N189 Chronic kidney disease, unspecified: Secondary | ICD-10-CM | POA: Insufficient documentation

## 2021-09-23 DIAGNOSIS — D649 Anemia, unspecified: Secondary | ICD-10-CM | POA: Insufficient documentation

## 2021-09-23 HISTORY — DX: Acute myocardial infarction, unspecified: I21.9

## 2021-09-23 LAB — CBC WITH DIFFERENTIAL/PLATELET
Abs Immature Granulocytes: 0.01 10*3/uL (ref 0.00–0.07)
Basophils Absolute: 0 10*3/uL (ref 0.0–0.1)
Basophils Relative: 0 %
Eosinophils Absolute: 0.2 10*3/uL (ref 0.0–0.5)
Eosinophils Relative: 3 %
HCT: 30.7 % — ABNORMAL LOW (ref 39.0–52.0)
Hemoglobin: 10 g/dL — ABNORMAL LOW (ref 13.0–17.0)
Immature Granulocytes: 0 %
Lymphocytes Relative: 38 %
Lymphs Abs: 2.9 10*3/uL (ref 0.7–4.0)
MCH: 31.1 pg (ref 26.0–34.0)
MCHC: 32.6 g/dL (ref 30.0–36.0)
MCV: 95.3 fL (ref 80.0–100.0)
Monocytes Absolute: 0.6 10*3/uL (ref 0.1–1.0)
Monocytes Relative: 7 %
Neutro Abs: 4 10*3/uL (ref 1.7–7.7)
Neutrophils Relative %: 52 %
Platelets: 232 10*3/uL (ref 150–400)
RBC: 3.22 MIL/uL — ABNORMAL LOW (ref 4.22–5.81)
RDW: 15.2 % (ref 11.5–15.5)
WBC: 7.7 10*3/uL (ref 4.0–10.5)
nRBC: 0 % (ref 0.0–0.2)

## 2021-09-23 LAB — BASIC METABOLIC PANEL
Anion gap: 5 (ref 5–15)
BUN: 42 mg/dL — ABNORMAL HIGH (ref 8–23)
CO2: 24 mmol/L (ref 22–32)
Calcium: 8.8 mg/dL — ABNORMAL LOW (ref 8.9–10.3)
Chloride: 111 mmol/L (ref 98–111)
Creatinine, Ser: 1.3 mg/dL — ABNORMAL HIGH (ref 0.61–1.24)
GFR, Estimated: 55 mL/min — ABNORMAL LOW (ref >60)
Glucose, Bld: 118 mg/dL — ABNORMAL HIGH (ref 70–99)
Potassium: 4.3 mmol/L (ref 3.5–5.1)
Sodium: 140 mmol/L (ref 135–145)

## 2021-09-25 ENCOUNTER — Encounter (HOSPITAL_COMMUNITY)
Admission: RE | Disposition: A | Discharge status: Home / Self Care | Payer: Self-pay | Source: Ambulatory Visit | Attending: Internal Medicine

## 2021-09-25 ENCOUNTER — Ambulatory Visit (HOSPITAL_COMMUNITY)
Admission: RE | Admit: 2021-09-25 | Discharge: 2021-09-25 | Disposition: A | Discharge status: Home / Self Care | Payer: PPO | Source: Ambulatory Visit | Attending: Internal Medicine | Admitting: Internal Medicine

## 2021-09-25 ENCOUNTER — Ambulatory Visit (HOSPITAL_COMMUNITY): Payer: PPO | Admitting: Anesthesiology

## 2021-09-25 ENCOUNTER — Ambulatory Visit (HOSPITAL_BASED_OUTPATIENT_CLINIC_OR_DEPARTMENT_OTHER): Payer: PPO | Admitting: Anesthesiology

## 2021-09-25 ENCOUNTER — Encounter (HOSPITAL_COMMUNITY): Payer: Self-pay | Admitting: Internal Medicine

## 2021-09-25 DIAGNOSIS — K5730 Diverticulosis of large intestine without perforation or abscess without bleeding: Secondary | ICD-10-CM | POA: Diagnosis not present

## 2021-09-25 DIAGNOSIS — R933 Abnormal findings on diagnostic imaging of other parts of digestive tract: Secondary | ICD-10-CM

## 2021-09-25 DIAGNOSIS — Z87891 Personal history of nicotine dependence: Secondary | ICD-10-CM | POA: Insufficient documentation

## 2021-09-25 DIAGNOSIS — D128 Benign neoplasm of rectum: Secondary | ICD-10-CM | POA: Insufficient documentation

## 2021-09-25 DIAGNOSIS — Z1211 Encounter for screening for malignant neoplasm of colon: Secondary | ICD-10-CM | POA: Insufficient documentation

## 2021-09-25 DIAGNOSIS — C8590 Non-Hodgkin lymphoma, unspecified, unspecified site: Secondary | ICD-10-CM | POA: Diagnosis not present

## 2021-09-25 DIAGNOSIS — I129 Hypertensive chronic kidney disease with stage 1 through stage 4 chronic kidney disease, or unspecified chronic kidney disease: Secondary | ICD-10-CM | POA: Insufficient documentation

## 2021-09-25 DIAGNOSIS — Z7984 Long term (current) use of oral hypoglycemic drugs: Secondary | ICD-10-CM | POA: Insufficient documentation

## 2021-09-25 DIAGNOSIS — C884 Extranodal marginal zone b-cell lymphoma of mucosa-associated lymphoid tissue (malt-lymphoma) not having achieved remission: Secondary | ICD-10-CM

## 2021-09-25 DIAGNOSIS — Z955 Presence of coronary angioplasty implant and graft: Secondary | ICD-10-CM | POA: Diagnosis not present

## 2021-09-25 DIAGNOSIS — I2510 Atherosclerotic heart disease of native coronary artery without angina pectoris: Secondary | ICD-10-CM | POA: Diagnosis not present

## 2021-09-25 DIAGNOSIS — M1990 Unspecified osteoarthritis, unspecified site: Secondary | ICD-10-CM | POA: Diagnosis not present

## 2021-09-25 DIAGNOSIS — E1122 Type 2 diabetes mellitus with diabetic chronic kidney disease: Secondary | ICD-10-CM | POA: Insufficient documentation

## 2021-09-25 DIAGNOSIS — R948 Abnormal results of function studies of other organs and systems: Secondary | ICD-10-CM

## 2021-09-25 DIAGNOSIS — D649 Anemia, unspecified: Secondary | ICD-10-CM

## 2021-09-25 DIAGNOSIS — J45909 Unspecified asthma, uncomplicated: Secondary | ICD-10-CM | POA: Insufficient documentation

## 2021-09-25 DIAGNOSIS — D509 Iron deficiency anemia, unspecified: Secondary | ICD-10-CM

## 2021-09-25 DIAGNOSIS — R195 Other fecal abnormalities: Secondary | ICD-10-CM | POA: Diagnosis not present

## 2021-09-25 DIAGNOSIS — I252 Old myocardial infarction: Secondary | ICD-10-CM | POA: Diagnosis not present

## 2021-09-25 DIAGNOSIS — K219 Gastro-esophageal reflux disease without esophagitis: Secondary | ICD-10-CM | POA: Insufficient documentation

## 2021-09-25 DIAGNOSIS — F419 Anxiety disorder, unspecified: Secondary | ICD-10-CM | POA: Insufficient documentation

## 2021-09-25 DIAGNOSIS — I350 Nonrheumatic aortic (valve) stenosis: Secondary | ICD-10-CM

## 2021-09-25 DIAGNOSIS — E782 Mixed hyperlipidemia: Secondary | ICD-10-CM

## 2021-09-25 DIAGNOSIS — N183 Chronic kidney disease, stage 3 unspecified: Secondary | ICD-10-CM | POA: Insufficient documentation

## 2021-09-25 DIAGNOSIS — R194 Change in bowel habit: Secondary | ICD-10-CM | POA: Diagnosis not present

## 2021-09-25 DIAGNOSIS — Z79899 Other long term (current) drug therapy: Secondary | ICD-10-CM | POA: Diagnosis not present

## 2021-09-25 DIAGNOSIS — I10 Essential (primary) hypertension: Secondary | ICD-10-CM | POA: Diagnosis not present

## 2021-09-25 DIAGNOSIS — N189 Chronic kidney disease, unspecified: Secondary | ICD-10-CM

## 2021-09-25 HISTORY — PX: COLONOSCOPY WITH PROPOFOL: SHX5780

## 2021-09-25 HISTORY — PX: BIOPSY: SHX5522

## 2021-09-25 LAB — GLUCOSE, CAPILLARY: Glucose-Capillary: 80 mg/dL (ref 70–99)

## 2021-09-25 SURGERY — COLONOSCOPY WITH PROPOFOL
Anesthesia: General

## 2021-09-25 MED ORDER — PROPOFOL 500 MG/50ML IV EMUL
INTRAVENOUS | Status: AC
  Filled 2021-09-25: qty 50

## 2021-09-25 MED ORDER — LACTATED RINGERS IV SOLN: INTRAVENOUS | Status: DC

## 2021-09-25 MED ORDER — EPHEDRINE SULFATE-NACL 50-0.9 MG/10ML-% IV SOSY
PREFILLED_SYRINGE | INTRAVENOUS | Status: DC | PRN
  Administered 2021-09-25 (×2): 5 mg via INTRAVENOUS

## 2021-09-25 MED ORDER — PROPOFOL 500 MG/50ML IV EMUL
INTRAVENOUS | Status: DC | PRN
Start: 2021-09-25 — End: 2021-09-25
  Administered 2021-09-25: 100 ug/kg/min via INTRAVENOUS

## 2021-09-25 MED ORDER — SIMETHICONE 40 MG/0.6ML PO SUSP
ORAL | Status: DC | PRN
  Administered 2021-09-25: .6 mL

## 2021-09-25 MED ORDER — EPHEDRINE 5 MG/ML INJ
INTRAVENOUS | Status: AC
  Filled 2021-09-25: qty 5

## 2021-09-25 MED ORDER — PROPOFOL 10 MG/ML IV BOLUS
INTRAVENOUS | Status: DC | PRN
  Administered 2021-09-25: 60 mg via INTRAVENOUS

## 2021-09-25 NOTE — Interval H&P Note (Signed)
History and Physical Interval Note:  09/25/2021 7:15 AM  Bradley Sheppard  has presented today for surgery, with the diagnosis of rectal mass, positive PET, new diagnosis of non-Hodgkin's lymphoma.  The various methods of treatment have been discussed with the patient and family. After consideration of risks, benefits and other options for treatment, the patient has consented to  Procedure(s) with comments: COLONOSCOPY WITH PROPOFOL (N/A) - 11:00am as a surgical intervention.  The patient's history has been reviewed, patient examined, no change in status, stable for surgery.  I have reviewed the patient's chart and labs.  Questions were answered to the patient's satisfaction.     Bradley Sheppard  No change.  Diagnostic colonoscopy today per plan.  The risks, benefits, limitations, alternatives and imponderables have been reviewed with the patient. Questions have been answered. All parties are agreeable.

## 2021-09-25 NOTE — Discharge Instructions (Addendum)
  Colonoscopy Discharge Instructions  Read the instructions outlined below and refer to this sheet in the next few weeks. These discharge instructions provide you with general information on caring for yourself after you leave the hospital. Your doctor may also give you specific instructions. While your treatment has been planned according to the most current medical practices available, unavoidable complications occasionally occur. If you have any problems or questions after discharge, call Dr. Gala Romney at [phone]. ACTIVITY You may resume your regular activity, but move at a slower pace for the next 24 hours.  Take frequent rest periods for the next 24 hours.  Walking will help get rid of the air and reduce the bloated feeling in your belly (abdomen).  No driving for 24 hours (because of the medicine (anesthesia) used during the test).   Do not sign any important legal documents or operate any machinery for 24 hours (because of the anesthesia used during the test).  NUTRITION Drink plenty of fluids.  You may resume your normal diet as instructed by your doctor.  Begin with a light meal and progress to your normal diet. Heavy or fried foods are harder to digest and may make you feel sick to your stomach (nauseated).  Avoid alcoholic beverages for 24 hours or as instructed.  MEDICATIONS You may resume your normal medications unless your doctor tells you otherwise.  WHAT YOU CAN EXPECT TODAY Some feelings of bloating in the abdomen.  Passage of more gas than usual.  Spotting of blood in your stool or on the toilet paper.  IF YOU HAD POLYPS REMOVED DURING THE COLONOSCOPY: No aspirin products for 7 days or as instructed.  No alcohol for 7 days or as instructed.  Eat a soft diet for the next 24 hours.  FINDING OUT THE RESULTS OF YOUR TEST Not all test results are available during your visit. If your test results are not back during the visit, make an appointment with your caregiver to find out the  results. Do not assume everything is normal if you have not heard from your caregiver or the medical facility. It is important for you to follow up on all of your test results.  SEEK IMMEDIATE MEDICAL ATTENTION IF: You have more than a spotting of blood in your stool.  Your belly is swollen (abdominal distention).  You are nauseated or vomiting.  You have a temperature over 101.  You have abdominal pain or discomfort that is severe or gets worse throughout the day.    Diverticulosis information provided  Rectal mass found-biopsied   you will experience some rectal bleeding over the next 24 hours or so.  It should not be  much but will be noticeable for your next few bowel movements   further recommendations to follow pending review of pathology report   I discussed my findings in person with the patient's spouse, Dave Mergen   And daughter.

## 2021-09-25 NOTE — Op Note (Addendum)
[facility] Patient Name: Bradley Sheppard Procedure Date: 09/25/2021 7:17 AM MRN: [id number] Date of Birth: [birth year] Attending MD: Norvel Richards , MD CSN: [id number] Age: 81 Admit Type: Outpatient Procedure:                Colonoscopy Indications:              Heme positive stool, Abnormal PET scan of the GI                            tract Providers:                Norvel Richards, MD, Janeece Riggers, RN, Randa Spike, Technician Referring MD:              Medicines:                Propofol per Anesthesia Complications:            No immediate complications. Estimated Blood Loss:     Estimated blood loss was minimal. Procedure:                Pre-Anesthesia Assessment:                           - Prior to the procedure, a History and Physical                            was performed, and patient medications and                            allergies were reviewed. The patient's tolerance of                            previous anesthesia was also reviewed. The risks                            and benefits of the procedure and the sedation                            options and risks were discussed with the patient.                            All questions were answered, and informed consent                            was obtained. Prior Anticoagulants: The patient has                            taken no previous anticoagulant or antiplatelet                            agents. ASA Grade Assessment: III - A patient with  severe systemic disease. After reviewing the risks                            and benefits, the patient was deemed in                            satisfactory condition to undergo the procedure.                           After obtaining informed consent, the colonoscope                            was passed under direct vision. Throughout the                            procedure, the patient's blood  pressure, pulse, and                            oxygen saturations were monitored continuously. The                            [id number]) scope was introduced through the                            anus and advanced to the the cecum, identified by                            appendiceal orifice and ileocecal valve. The                            colonoscopy was performed without difficulty. The                            patient tolerated the procedure well. The quality                            of the bowel preparation was adequate. Scope In: 7:40:41 AM Scope Out: 8:04:53 AM Scope Withdrawal Time: 0 hours 13 minutes 59 seconds  Total Procedure Duration: 0 hours 24 minutes 12 seconds  Findings:      The perianal and digital rectal examinations were normal.      Scattered medium-mouthed diverticula were found in the sigmoid colon and       descending colon. Redundant colon. Changing of the patient's position       and external abdominal pressure required to reach the cecum.      In the rectum, extending down to the anal verge, there was a 3x4       sonometer laterally spreading depressed lesion (please see on?face and       retroflexed views. Biopsies taken. Impression:               - Diverticulosis in the sigmoid colon and in the                            descending colon. Redundant and elongated colon                           -  Laterally spreading, depressed rectal lesion as                            described?status post biopsy Moderate Sedation:      Moderate (conscious) sedation was personally administered by an       anesthesia professional. The following parameters were monitored: oxygen       saturation, heart rate, blood pressure, respiratory rate, EKG, adequacy       of pulmonary ventilation, and response to care. Recommendation:           - Patient has a contact number available for                            emergencies. The signs and symptoms of potential                             delayed complications were discussed with the                            patient. Return to normal activities tomorrow.                            Written discharge instructions were provided to the                            patient.                           - Advance diet as tolerated. Follow-up on                            pathology. Follow-up with oncology. Procedure Code(s):        --- Professional ---                           [id number], Colonoscopy, flexible; diagnostic, including                            collection of specimen(s) by brushing or washing,                            when performed (separate procedure) Diagnosis Code(s):        --- Professional ---                           R19.5, Other fecal abnormalities                           K57.30, Diverticulosis of large intestine without                            perforation or abscess without bleeding                           R93.3, Abnormal findings on diagnostic imaging of  other parts of digestive tract CPT copyright 2019 American Medical Association. All rights reserved. The codes documented in this report are preliminary and upon coder review may  be revised to meet current compliance requirements. Cristopher Estimable. Tomi Paddock, MD Norvel Richards, MD 09/25/2021 8:19:28 AM This report has been signed electronically. Number of Addenda: 0

## 2021-09-25 NOTE — Anesthesia Postprocedure Evaluation (Signed)
Anesthesia Post Note  Patient: Bradley Sheppard  Procedure(s) Performed: COLONOSCOPY WITH PROPOFOL BIOPSY  Patient location during evaluation: Phase II Anesthesia Type: General Level of consciousness: awake and alert and oriented Pain management: pain level controlled Vital Signs Assessment: post-procedure vital signs reviewed and stable Respiratory status: spontaneous breathing, nonlabored ventilation and respiratory function stable Cardiovascular status: blood pressure returned to baseline and stable Postop Assessment: no apparent nausea or vomiting Anesthetic complications: no   No notable events documented.   Last Vitals:  Vitals:   09/25/21 0709 09/25/21 0814  BP: 129/71 (!) 134/49  Pulse: (!) 59 65  Resp: 14 (!) 22  Temp: 36.7 C (!) 36.4 C  SpO2: 100% 99%    Last Pain:  Vitals:   09/25/21 0814  TempSrc: Oral  PainSc: 0-No pain                 [name] C Zebastian Carico

## 2021-09-25 NOTE — Anesthesia Preprocedure Evaluation (Signed)
Anesthesia Evaluation  Patient identified by MRN, date of birth, ID band Patient awake    Reviewed: Allergy & Precautions, NPO status , Patient's Chart, lab work & pertinent test results  Airway Mallampati: II  TM Distance: >3 FB Neck ROM: Full    Dental  (+) Dental Advisory Given, Edentulous Upper, Edentulous Lower   Pulmonary asthma , former smoker,    Pulmonary exam normal breath sounds clear to auscultation       Cardiovascular Exercise Tolerance: Good hypertension, Pt. on medications + CAD, + Past MI and + Cardiac Stents   Rhythm:Regular Rate:Normal + Systolic murmurs    Neuro/Psych Anxiety negative neurological ROS  negative psych ROS   GI/Hepatic Neg liver ROS, GERD  Medicated and Controlled,  Endo/Other  diabetes, Well Controlled, Type 2, Oral Hypoglycemic Agents  Renal/GU Renal InsufficiencyRenal disease  negative genitourinary   Musculoskeletal  (+) Arthritis ,   Abdominal   Peds negative pediatric ROS (+)  Hematology  (+) Blood dyscrasia (MALT lymphoma), anemia ,   Anesthesia Other Findings   Reproductive/Obstetrics negative OB ROS                          Anesthesia Physical Anesthesia Plan  ASA: 3  Anesthesia Plan: General   Post-op Pain Management: Minimal or no pain anticipated   Induction: Intravenous  PONV Risk Score and Plan:   Airway Management Planned: Nasal Cannula and Natural Airway  Additional Equipment:   Intra-op Plan:   Post-operative Plan:   Informed Consent: I have reviewed the patients History and Physical, chart, labs and discussed the procedure including the risks, benefits and alternatives for the proposed anesthesia with the patient or authorized representative who has indicated his/her understanding and acceptance.     Dental advisory given  Plan Discussed with: CRNA and Surgeon  Anesthesia Plan Comments:         Anesthesia Quick  Evaluation

## 2021-09-25 NOTE — Transfer of Care (Signed)
Immediate Anesthesia Transfer of Care Note  Patient: TEL HEVIA  Procedure(s) Performed: COLONOSCOPY WITH PROPOFOL BIOPSY  Patient Location: PACU  Anesthesia Type:General  Level of Consciousness: awake, alert  and oriented  Airway & Oxygen Therapy: Patient Spontanous Breathing  Post-op Assessment: Report given to RN, Post -op Vital signs reviewed and stable, Patient moving all extremities X 4 and Patient able to stick tongue midline  Post vital signs: Reviewed  Last Vitals:  Vitals Value Taken Time  BP 134/49   Temp 97.8   Pulse 62   Resp 15   SpO2 99     Last Pain:  Vitals:   09/25/21 0709  TempSrc: Oral  PainSc: 6       Patients Stated Pain Goal: 7 (34/14/43 6016)  Complications: No notable events documented.

## 2021-09-26 LAB — SURGICAL PATHOLOGY

## 2021-10-01 ENCOUNTER — Other Ambulatory Visit: Payer: Self-pay | Admitting: *Deleted

## 2021-10-01 ENCOUNTER — Other Ambulatory Visit (HOSPITAL_COMMUNITY): Payer: Self-pay

## 2021-10-01 DIAGNOSIS — N1832 Chronic kidney disease, stage 3b: Secondary | ICD-10-CM

## 2021-10-01 DIAGNOSIS — K6289 Other specified diseases of anus and rectum: Secondary | ICD-10-CM

## 2021-10-01 DIAGNOSIS — D472 Monoclonal gammopathy: Secondary | ICD-10-CM

## 2021-10-01 NOTE — Progress Notes (Signed)
Bradley Jack, MD  Candis Musa, LPN; Sherilyn Banker, Shawna Orleans, Nigel Sloop Please order MRI of the pelvis (rectal cancer protocol) and schedule him and call and let him know that me and Dr. Gala Romney recommended.   Order placed per Dr. Tomie China order.

## 2021-10-02 ENCOUNTER — Telehealth: Payer: Self-pay | Admitting: *Deleted

## 2021-10-02 ENCOUNTER — Encounter (HOSPITAL_COMMUNITY): Payer: Self-pay | Admitting: Internal Medicine

## 2021-10-02 NOTE — Telephone Encounter (Signed)
   Pre-operative Risk Assessment    Patient Name: Bradley Sheppard  DOB: [birth year] MRN: [id number]      Request for Surgical Clearance    Procedure:  consider transanal resection   Date of Surgery:  Clearance TBD                                 Surgeon:  Michael Boston MD Surgeon's Group or Practice Name:  [facility] Phone number:  [phone] Fax number:  [phone]   Type of Clearance Requested:   - Medical    Type of Anesthesia:  General    Additional requests/questions:    SignedMarlou Sa   10/02/2021, 9:03 AM

## 2021-10-02 NOTE — Telephone Encounter (Signed)
Dr. Domenic Polite to review.  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  Patient has upcoming transanal resection procedure.  I called and spoke with the patient and his wife.  He is very hard of hearing.  He denies any recent chest pain, however he says he mainly rely on a walker to walk around.  The maximum distance he is able to walk is from room to room before he give out due to weakness and fatigue.  I do not think he can accomplish more than 4 METS of activity.  Patient has a history of CAD s/p DES to LAD and RCA in 2003, mild aortic stenosis, hyperlipidemia and CKD stage III.  He was last seen in February 2023 at which time he did not have any anginal symptom.  Last echocardiogram obtained on 07/09/2020 demonstrated EF 60 to 65%, no regional wall motion abnormality, severe LAE, normal RV, circumferential pericardial effusion, mild aortic stenosis.  Dr. Domenic Polite, does the patient require any further work-up prior to transanal tumor resection?  Please forward your response to P CV DIV PREOP

## 2021-10-02 NOTE — Telephone Encounter (Signed)
Patient is getting appointment to be evaluated for preop clearance.  I will remove the patient from the preop pool.

## 2021-10-02 NOTE — Telephone Encounter (Signed)
-----   Message from Satira Sark, MD sent at 10/02/2021  8:14 AM EDT ----- Regarding: RE: new rectal polrp - transanal excision? We will see if we can arrange a telephone encounter to at least discuss his cardiac situation and any potential symptoms prior to deciding about his surgical risk.  We have managed him conservatively in terms of medical therapy in the absence of progressive angina symptoms, although he has fairly low-level activity at baseline.  I expect his perioperative cardiac risk is at least intermediate which would not preclude surgery.  I will forward this to my nurse to see if we can get a visit set up with me or APP in our perioperative clearance pool. ----- Message ----- From: Michael Boston, MD Sent: 10/01/2021   5:25 PM EDT To: Illene Regulus, Mindy Mickle Asper, CMA, # Subject: new rectal polrp - transanal excision?         Seems reasonable to consider transanal resection under general anesthesia.  Usually outpatient surgery but might be overnight stay for him.  Possible TEM modified resection.  Lars Mage, my CCS CMA, please see if we can help facilitate this  He follows Dr. Domenic Polite with cardiology rather regularly.  Probably be cleared for surgery but would like to double check with Sam that he does not feel there is anything else need to be done.  Patient due for 46-monthfollow-up in August so why not get it now!  ----- Message ----- From: RDaneil Dolin MD Sent: 10/01/2021   2:58 PM EDT To: SMichael Boston MD, MCheron Every CMA, #  Discussed electronically with Dr. KDelton Coombeswho agrees with referral to Dr. GJohney Maine  Query candidate for trans anal resection vs other treatment.  Patient already aware of plan.  Lets please refer this nice gentleman to Dr. GJohney Mainein GCopefor consultation.

## 2021-10-02 NOTE — Telephone Encounter (Signed)
Appointment scheduled with Diona Browner NP at [place] office 10/06/2021 '@10'$ :05 am. Patient informed and verbalized understanding of plan.

## 2021-10-05 NOTE — Progress Notes (Unsigned)
Office Visit    Patient Name: Bradley Sheppard Date of Encounter: 10/06/2021  Primary Care Provider:  [name], MD Primary Cardiologist:  Rozann Lesches, MD  Chief Complaint    81 year old male with a history of CAD, mild aortic stenosis, bilateral carotid artery stenosis,hypertension, hyperlipidemia, CKD stage IIIb, type 2 diabetes, GERD, arthritis, iron deficiency anemia, non-Hodgkin's lymphoma and rectal tumor who presents for follow-up related to CAD, aortic stenosis, and for preoperative cardiac evaluation.  Past Medical History    Past Medical History:  Diagnosis Date   Anxiety    Arthritis    Asthma    Chronic pain    CKD (chronic kidney disease) stage 3, GFR 30-59 ml/min (HCC)    Coronary atherosclerosis of native coronary artery    Multvessel, DES to LAD and RCA 8/03; EF 65% by echo 11/2014   DM2 (diabetes mellitus, type 2) ([place])    Essential hypertension    Gastritis    Related to NSAIDs   GERD (gastroesophageal reflux disease)    Iron deficiency anemia    Iron deficiency anemia 07/28/2021   Mitral valve regurgitation    Mixed hyperlipidemia    Myocardial infarction [facility])    Nephrolithiasis    Past Surgical History:  Procedure Laterality Date   APPENDECTOMY     BIOPSY  09/25/2021   Procedure: BIOPSY;  Surgeon: Daneil Dolin, MD;  Location: [facility];  Service: Endoscopy;;   CARDIAC CATHETERIZATION  01/2002   90% obstruction in proximal LAD, 60 % obstruction in proximal circumflex and 70%  in proximal RCA. LAD & RCA stented  stents x 2   COLONOSCOPY WITH PROPOFOL N/A 09/25/2021   Procedure: COLONOSCOPY WITH PROPOFOL;  Surgeon: Daneil Dolin, MD;  Location: [facility];  Service: Endoscopy;  Laterality: N/A;  11:00am   L knee replacement  2009   Subsequent infectino requiring resectino arthroplasty with incision and drainage 8/10, and reimplantizathion arthroplasty, 9/20. 5 total surgeries.    Allergies  Allergies  Allergen Reactions    Prednisone     "makes him feel faint"    History of Present Illness    82 year old male with the above past medical history including CAD, mild aortic stenosis, bilateral carotid artery stenosis, hypertension, hyperlipidemia, CKD stage IIIb, type 2 diabetes, GERD, arthritis, iron deficiency anemia, non-Hodgkin's lymphoma, and rectal tumor.  He has a history of CAD s/p DES-LAD and RCA in 2003.  Lexiscan Myoview in 2014 consistent with history of CAD, revealed scar within the inferolateral wall with mild to moderate peri-infarct ischemia, EF 57%.  Most recent echocardiogram in March 2022 showed EF 60 to 65%, normal RV function, no RWMA, mild LVH, indeterminate LV diastolic parameters, mild aortic valve stenosis, mean gradient 13.4 mmHg.  Carotid Dopplers in September 2022 showed 1 to 39% B ICA stenosis.  He was last seen in the office on 06/30/2021 and was stable overall from a cardiac standpoint. He denied symptoms concerning for angina, though he did report difficulty ambulating in the setting of generalized weakness, thought to be exacerbated by iron deficiency anemia.  He was recently diagnosed with non-Hodgkin's lymphoma. 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 He is now pending transanal resection procedure with Dr. Remo Lipps gross of [facility] surgery, date TBD.  He presents today for follow-up and for preoperative cardiac evaluation accompanied by his wife and daughter.  Since his last visit he has been stable overall from a cardiac standpoint. He does have some dyspnea on exertion, however  this is not similar to his prior anginal equivalent.  Since October 2022, he has experienced overall physical deconditioning. This appears to be multifactorial in the setting of severe orthopedic issues (right hip lateral knee pain), iron deficiency anemia, non-Hodgkin's lymphoma.  He states that when he walks he feels like he will "give out." Overall, he denies symptoms concerning for angina, denies  edema, PND, orthopnea.   Home Medications    Current Outpatient Medications  Medication Sig Dispense Refill   acetaminophen (TYLENOL) 500 MG tablet Take 500 mg by mouth every 6 (six) hours as needed.     amLODipine (NORVASC) 10 MG tablet Take 10 mg by mouth daily.     aspirin 81 MG tablet Take 81 mg by mouth daily.     carvedilol (COREG) 12.5 MG tablet Take 18.75 mg by mouth 2 (two) times daily.     chlorthalidone (HYGROTON) 25 MG tablet Take 25 mg by mouth daily.     glipiZIDE (GLUCOTROL XL) 10 MG 24 hr tablet Take 10 mg by mouth 2 (two) times daily.     hydrALAZINE (APRESOLINE) 25 MG tablet Take 1 tablet (25 mg total) by mouth 3 (three) times daily. 270 tablet 3   IRON PO Take 1 tablet by mouth 2 (two) times daily.     Lancets (ONETOUCH DELICA PLUS [id number]) MISC Apply topically.     LORazepam (ATIVAN) 1 MG tablet Take 1 mg by mouth at bedtime.     metFORMIN (GLUCOPHAGE) 500 MG tablet Take 500-1,000 mg by mouth See admin instructions. Take 1000 mg in the morning and 500 mg at night     nitroGLYCERIN (NITROSTAT) 0.4 MG SL tablet Place 1 tablet (0.4 mg total) under the tongue every 5 (five) minutes as needed. 25 tablet 3   ONETOUCH VERIO test strip 2 (two) times daily. use for testing     pioglitazone (ACTOS) 45 MG tablet Take 45 mg by mouth daily.     polyethylene glycol-electrolytes (TRILYTE) 420 g solution Take 4,000 mLs by mouth as directed. 4000 mL 0   rosuvastatin (CRESTOR) 20 MG tablet Take 20 mg by mouth daily.     Semaglutide (OZEMPIC, 0.25 OR 0.5 MG/DOSE, [place]) Inject 0.25 mLs into the skin every Wednesday.     tiZANidine (ZANAFLEX) 2 MG tablet Take 2 mg by mouth 2 (two) times daily.     valsartan (DIOVAN) 320 MG tablet Take 320 mg by mouth daily.     vitamin B-12 (CYANOCOBALAMIN) 500 MCG tablet Take 500 mcg by mouth daily.     vitamin C (ASCORBIC ACID) 500 MG tablet Take 500 mg by mouth daily.     No current facility-administered medications for this visit.     Review of  Systems    He denies chest pain, palpitations, dyspnea, pnd, orthopnea, n, v, dizziness, syncope, edema, weight gain, or early satiety. All other systems reviewed and are otherwise negative except as noted above.   Physical Exam    VS:  BP (!) 148/67 (BP Location: Right Arm, Patient Position: Sitting, Cuff Size: Normal)   Pulse 70   Resp 20   Ht 6' (1.829 m)   Wt 207 lb (93.9 kg)   SpO2 99%   BMI 28.07 kg/m   GEN: Well nourished, well developed, in no acute distress. HEENT: normal. Neck: Supple, no JVD, carotid bruits, or masses. Cardiac: RRR, no murmurs, rubs, or gallops. No clubbing, cyanosis, edema.  Radials/DP/PT 2+ and equal bilaterally.  Respiratory:  Respirations regular and unlabored, clear  to auscultation bilaterally. GI: Soft, nontender, nondistended, BS + x 4. MS: no deformity or atrophy. Skin: warm and dry, no rash. Neuro:  Strength and sensation are intact. Psych: Normal affect.  Accessory Clinical Findings    ECG personally reviewed by me today -sinus rhythm, first-degree AV block, 70 bpm, LAD, LBBB- no acute changes.  Lab Results  Component Value Date   WBC 7.7 09/23/2021   HGB 10.0 (L) 09/23/2021   HCT 30.7 (L) 09/23/2021   MCV 95.3 09/23/2021   PLT 232 09/23/2021   Lab Results  Component Value Date   CREATININE 1.30 (H) 09/23/2021   BUN 42 (H) 09/23/2021   NA 140 09/23/2021   K 4.3 09/23/2021   CL 111 09/23/2021   CO2 24 09/23/2021   Lab Results  Component Value Date   ALT 13 07/28/2021   AST 24 07/28/2021   ALKPHOS 65 07/28/2021   BILITOT 0.4 07/28/2021   No results found for: CHOL, HDL, LDLCALC, LDLDIRECT, TRIG, CHOLHDL  No results found for: HGBA1C  Assessment & Plan    1. CAD: He has a history of CAD s/p DES-LAD and RCA in 2003. Lexiscan Myoview in 2014 consistent with history of CAD, revealed scar within the inferolateral wall with mild to moderate peri-infarct ischemia, EF 57%.  EKG today shows sinus rhythm with first-degree AV block,  70 bpm, LAD, LBBB. He does report some dyspnea on exertion, however, this is not similar to his prior anginal equivalent. Furthermore, he has experienced severe physical deconditioning in the setting of orthopedic issues, iron deficiency anemia, and non-Hodgkin's lymphoma. Continue conservative management of CAD with aspirin, amlodipine, carvedilol, chlorthalidone, valsartan, hydralazine as below, and Crestor.  2. Aortic valve stenosis: Echo in March 2022 showed EF 60 to 65%, normal RV function, no RWMA, mild LVH, indeterminate LV diastolic parameters, mild aortic valve stenosis, mean gradient 13.4 mmHg. Dependent bilateral lower extremity edema, otherwise euvolemic and well compensated on exam. Continue current medications as above. Consider repeat echocardiogram in 1 year, sooner if clinically indicated.  3. Carotid artery stenosis: Carotid Dopplers in September 2022 showed 1 to 39% B ICA stenosis. Asymptomatic.  Continue aspirin, Crestor.  No indication for repeat imaging at this time.  4. Hypertension: BP above goal in office today.  He is noted to be taking both ramipril and valsartan.  Will stop ramipril as concomitant therapy with ACEi/ARB not recommended. Will start hydralazine 25 mg tid. Continue to monitor home BP and report BP consistently > 140/80, SBP <110.  Otherwise, continue current antihypertensive regimen.   5. Hyperlipidemia: LDL was 64 in August 2022.  Continue aspirin, Crestor.  6. CKD stage IIIb: Creatinine was 1.30 on 09/23/2021. Stable.   7. Type 2 diabetes: A1c was 8.6 in March 2023.  Monitored and managed per PCP.  8. Preoperative cardiac evaluation: According to the Revised Cardiac Risk Index (RCRI), his Perioperative Risk of Major Cardiac Event is (%): 0.9. His Functional Capacity in METs is: 2.96 according to the [employer] Activity Status Index (DASI). Patient is at intermediate risk for surgery given past medical history, comorbidities. However, this is overall a low risk  surgery. He is unable to complete 4 METS, however, this has been his baseline since October 2023.  Decreased METS likely multifactorial in the setting of physical deconditioning secondary to orthopedic issues, iron deficiency anemia, and non-Hodgkin's lymphoma. He denies symptoms concerning for angina.  Discussed with Dr. Ellyn Hack, DOD, who does not recommend any additional testing prior to surgery. Additionally, per Dr. Myles Gip  telephone encounter dated 10/02/2021, he is at intermediate risk given cardiac history, however, this does "not preclude surgery." Therefore, based on ACC/AHA guidelines, patient would be at acceptable risk for the planned procedure without further cardiovascular testing. I will route this recommendation to the requesting party via Epic fax function.  9. Disposition: Follow-up as scheduled with Dr. Domenic Polite in September 2023.   Lenna Sciara, NP 10/06/2021, 1:23 PM

## 2021-10-06 ENCOUNTER — Encounter: Payer: Self-pay | Admitting: Nurse Practitioner

## 2021-10-06 ENCOUNTER — Ambulatory Visit (INDEPENDENT_AMBULATORY_CARE_PROVIDER_SITE_OTHER): Payer: PPO | Admitting: Nurse Practitioner

## 2021-10-06 VITALS — BP 148/67 | HR 70 | Resp 20 | Ht 72.0 in | Wt 207.0 lb

## 2021-10-06 DIAGNOSIS — E119 Type 2 diabetes mellitus without complications: Secondary | ICD-10-CM

## 2021-10-06 DIAGNOSIS — I350 Nonrheumatic aortic (valve) stenosis: Secondary | ICD-10-CM | POA: Diagnosis not present

## 2021-10-06 DIAGNOSIS — Z01810 Encounter for preprocedural cardiovascular examination: Secondary | ICD-10-CM | POA: Diagnosis not present

## 2021-10-06 DIAGNOSIS — N1832 Chronic kidney disease, stage 3b: Secondary | ICD-10-CM

## 2021-10-06 DIAGNOSIS — E785 Hyperlipidemia, unspecified: Secondary | ICD-10-CM

## 2021-10-06 DIAGNOSIS — I6523 Occlusion and stenosis of bilateral carotid arteries: Secondary | ICD-10-CM

## 2021-10-06 DIAGNOSIS — I10 Essential (primary) hypertension: Secondary | ICD-10-CM | POA: Diagnosis not present

## 2021-10-06 DIAGNOSIS — I2510 Atherosclerotic heart disease of native coronary artery without angina pectoris: Secondary | ICD-10-CM | POA: Diagnosis not present

## 2021-10-06 MED ORDER — HYDRALAZINE HCL 25 MG PO TABS: 25.0000 mg | ORAL_TABLET | Freq: Three times a day (TID) | ORAL | 3 refills | Status: DC

## 2021-10-06 NOTE — Patient Instructions (Signed)
Medication Instructions:  Stop Ramipril. Start Hydralazine 25 mg ( Take 1 Tablet Three Time Daily). *If you need a refill on your cardiac medications before your next appointment, please call your pharmacy*   Lab Work: No Labs If you have labs (blood work) drawn today and your tests are completely normal, you will receive your results only by: [place] (if you have MyChart) OR A paper copy in the mail If you have any lab test that is abnormal or we need to change your treatment, we will call you to review the results.   Testing/Procedures: No Testing   Follow-Up: At [facility], you and your health needs are our priority.  As part of our continuing mission to provide you with exceptional heart care, we have created designated Provider Care Teams.  These Care Teams include your primary Cardiologist (physician) and Advanced Practice Providers (APPs -  Physician Assistants and Nurse Practitioners) who all work together to provide you with the care you need, when you need it.  We recommend signing up for the patient portal called "MyChart".  Sign up information is provided on this After Visit Summary.  MyChart is used to connect with patients for Virtual Visits (Telemedicine).  Patients are able to view lab/test results, encounter notes, upcoming appointments, etc.  Non-urgent messages can be sent to your provider as well.   To learn more about what you can do with MyChart, go to [email or web address].    Your next appointment:   Keep Scheduled Appointment  The format for your next appointment:   In Person  Provider:   Rozann Lesches, MD    Other Instructions Monitor Blood Pressure . Above 140/80 Call office.  Important Information About Sugar

## 2021-10-07 ENCOUNTER — Inpatient Hospital Stay (HOSPITAL_COMMUNITY): Payer: PPO

## 2021-10-10 ENCOUNTER — Other Ambulatory Visit (HOSPITAL_BASED_OUTPATIENT_CLINIC_OR_DEPARTMENT_OTHER): Payer: PPO

## 2021-10-13 ENCOUNTER — Other Ambulatory Visit (HOSPITAL_COMMUNITY): Payer: PPO

## 2021-10-14 ENCOUNTER — Ambulatory Visit (HOSPITAL_COMMUNITY): Payer: PPO | Admitting: Hematology

## 2021-10-15 DIAGNOSIS — E1165 Type 2 diabetes mellitus with hyperglycemia: Secondary | ICD-10-CM | POA: Diagnosis not present

## 2021-10-15 DIAGNOSIS — Z299 Encounter for prophylactic measures, unspecified: Secondary | ICD-10-CM | POA: Diagnosis not present

## 2021-10-15 DIAGNOSIS — I10 Essential (primary) hypertension: Secondary | ICD-10-CM | POA: Diagnosis not present

## 2021-10-15 DIAGNOSIS — G479 Sleep disorder, unspecified: Secondary | ICD-10-CM | POA: Diagnosis not present

## 2021-10-15 DIAGNOSIS — D649 Anemia, unspecified: Secondary | ICD-10-CM | POA: Diagnosis not present

## 2021-10-15 DIAGNOSIS — M62838 Other muscle spasm: Secondary | ICD-10-CM | POA: Diagnosis not present

## 2021-10-15 DIAGNOSIS — I2510 Atherosclerotic heart disease of native coronary artery without angina pectoris: Secondary | ICD-10-CM | POA: Diagnosis not present

## 2021-10-21 ENCOUNTER — Other Ambulatory Visit (HOSPITAL_COMMUNITY): Payer: PPO

## 2021-10-22 ENCOUNTER — Other Ambulatory Visit (HOSPITAL_COMMUNITY): Payer: PPO

## 2021-10-29 ENCOUNTER — Ambulatory Visit (HOSPITAL_COMMUNITY): Payer: PPO | Admitting: Physician Assistant

## 2021-11-03 ENCOUNTER — Other Ambulatory Visit (HOSPITAL_COMMUNITY): Payer: PPO

## 2021-11-05 ENCOUNTER — Ambulatory Visit (HOSPITAL_COMMUNITY): Payer: PPO | Admitting: Hematology

## 2021-11-07 ENCOUNTER — Other Ambulatory Visit (HOSPITAL_BASED_OUTPATIENT_CLINIC_OR_DEPARTMENT_OTHER): Payer: PPO

## 2021-11-07 ENCOUNTER — Inpatient Hospital Stay (HOSPITAL_COMMUNITY): Payer: PPO | Attending: Hematology

## 2021-11-07 ENCOUNTER — Ambulatory Visit (HOSPITAL_COMMUNITY)
Admission: RE | Admit: 2021-11-07 | Discharge: 2021-11-07 | Disposition: A | Discharge status: Home / Self Care | Payer: PPO | Source: Ambulatory Visit | Attending: Hematology | Admitting: Hematology

## 2021-11-07 DIAGNOSIS — E538 Deficiency of other specified B group vitamins: Secondary | ICD-10-CM | POA: Insufficient documentation

## 2021-11-07 DIAGNOSIS — D492 Neoplasm of unspecified behavior of bone, soft tissue, and skin: Secondary | ICD-10-CM | POA: Diagnosis not present

## 2021-11-07 DIAGNOSIS — D472 Monoclonal gammopathy: Secondary | ICD-10-CM | POA: Diagnosis not present

## 2021-11-07 DIAGNOSIS — R590 Localized enlarged lymph nodes: Secondary | ICD-10-CM | POA: Diagnosis not present

## 2021-11-07 DIAGNOSIS — K5730 Diverticulosis of large intestine without perforation or abscess without bleeding: Secondary | ICD-10-CM | POA: Diagnosis not present

## 2021-11-07 DIAGNOSIS — N1832 Chronic kidney disease, stage 3b: Secondary | ICD-10-CM | POA: Insufficient documentation

## 2021-11-07 DIAGNOSIS — D631 Anemia in chronic kidney disease: Secondary | ICD-10-CM | POA: Insufficient documentation

## 2021-11-07 DIAGNOSIS — C20 Malignant neoplasm of rectum: Secondary | ICD-10-CM | POA: Diagnosis not present

## 2021-11-07 LAB — COMPREHENSIVE METABOLIC PANEL
ALT: 16 U/L (ref 0–44)
AST: 21 U/L (ref 15–41)
Albumin: 3.3 g/dL — ABNORMAL LOW (ref 3.5–5.0)
Alkaline Phosphatase: 78 U/L (ref 38–126)
Anion gap: 5 (ref 5–15)
BUN: 40 mg/dL — ABNORMAL HIGH (ref 8–23)
CO2: 23 mmol/L (ref 22–32)
Calcium: 8.8 mg/dL — ABNORMAL LOW (ref 8.9–10.3)
Chloride: 112 mmol/L — ABNORMAL HIGH (ref 98–111)
Creatinine, Ser: 1.28 mg/dL — ABNORMAL HIGH (ref 0.61–1.24)
GFR, Estimated: 56 mL/min — ABNORMAL LOW (ref >60)
Glucose, Bld: 158 mg/dL — ABNORMAL HIGH (ref 70–99)
Potassium: 4.2 mmol/L (ref 3.5–5.1)
Sodium: 140 mmol/L (ref 135–145)
Total Bilirubin: 0.5 mg/dL (ref 0.3–1.2)
Total Protein: 7.5 g/dL (ref 6.5–8.1)

## 2021-11-07 LAB — CBC WITH DIFFERENTIAL/PLATELET
Abs Immature Granulocytes: 0.03 10*3/uL (ref 0.00–0.07)
Basophils Absolute: 0 10*3/uL (ref 0.0–0.1)
Basophils Relative: 0 %
Eosinophils Absolute: 0.3 10*3/uL (ref 0.0–0.5)
Eosinophils Relative: 3 %
HCT: 29.5 % — ABNORMAL LOW (ref 39.0–52.0)
Hemoglobin: 9.5 g/dL — ABNORMAL LOW (ref 13.0–17.0)
Immature Granulocytes: 0 %
Lymphocytes Relative: 29 %
Lymphs Abs: 2.4 10*3/uL (ref 0.7–4.0)
MCH: 31.1 pg (ref 26.0–34.0)
MCHC: 32.2 g/dL (ref 30.0–36.0)
MCV: 96.7 fL (ref 80.0–100.0)
Monocytes Absolute: 0.7 10*3/uL (ref 0.1–1.0)
Monocytes Relative: 8 %
Neutro Abs: 4.8 10*3/uL (ref 1.7–7.7)
Neutrophils Relative %: 60 %
Platelets: 235 10*3/uL (ref 150–400)
RBC: 3.05 MIL/uL — ABNORMAL LOW (ref 4.22–5.81)
RDW: 14.6 % (ref 11.5–15.5)
WBC: 8.1 10*3/uL (ref 4.0–10.5)
nRBC: 0 % (ref 0.0–0.2)

## 2021-11-07 LAB — URIC ACID: Uric Acid, Serum: 7.1 mg/dL (ref 3.7–8.6)

## 2021-11-07 LAB — IRON AND TIBC
Iron: 44 ug/dL — ABNORMAL LOW (ref 45–182)
Saturation Ratios: 14 % — ABNORMAL LOW (ref 17.9–39.5)
TIBC: 312 ug/dL (ref 250–450)
UIBC: 268 ug/dL

## 2021-11-07 LAB — FERRITIN: Ferritin: 66 ng/mL (ref 24–336)

## 2021-11-07 LAB — LACTATE DEHYDROGENASE: LDH: 168 U/L (ref 98–192)

## 2021-11-11 ENCOUNTER — Ambulatory Visit: Payer: Self-pay | Admitting: Surgery

## 2021-11-11 DIAGNOSIS — R739 Hyperglycemia, unspecified: Secondary | ICD-10-CM

## 2021-11-11 DIAGNOSIS — K5900 Constipation, unspecified: Secondary | ICD-10-CM | POA: Diagnosis not present

## 2021-11-11 DIAGNOSIS — D128 Benign neoplasm of rectum: Secondary | ICD-10-CM | POA: Diagnosis not present

## 2021-11-11 DIAGNOSIS — R159 Full incontinence of feces: Secondary | ICD-10-CM | POA: Diagnosis not present

## 2021-11-12 ENCOUNTER — Inpatient Hospital Stay (HOSPITAL_COMMUNITY): Payer: PPO | Admitting: Hematology

## 2021-11-12 VITALS — BP 111/47 | HR 61 | Temp 97.5°F | Resp 16

## 2021-11-12 DIAGNOSIS — D649 Anemia, unspecified: Secondary | ICD-10-CM | POA: Insufficient documentation

## 2021-11-12 DIAGNOSIS — E538 Deficiency of other specified B group vitamins: Secondary | ICD-10-CM | POA: Diagnosis not present

## 2021-11-12 DIAGNOSIS — R5383 Other fatigue: Secondary | ICD-10-CM | POA: Diagnosis not present

## 2021-11-12 DIAGNOSIS — N1832 Chronic kidney disease, stage 3b: Secondary | ICD-10-CM | POA: Diagnosis not present

## 2021-11-12 DIAGNOSIS — M25561 Pain in right knee: Secondary | ICD-10-CM | POA: Insufficient documentation

## 2021-11-12 DIAGNOSIS — R0602 Shortness of breath: Secondary | ICD-10-CM | POA: Insufficient documentation

## 2021-11-12 DIAGNOSIS — R197 Diarrhea, unspecified: Secondary | ICD-10-CM | POA: Diagnosis not present

## 2021-11-12 DIAGNOSIS — N189 Chronic kidney disease, unspecified: Secondary | ICD-10-CM | POA: Insufficient documentation

## 2021-11-12 DIAGNOSIS — D472 Monoclonal gammopathy: Secondary | ICD-10-CM | POA: Insufficient documentation

## 2021-11-12 DIAGNOSIS — M79604 Pain in right leg: Secondary | ICD-10-CM | POA: Insufficient documentation

## 2021-11-12 DIAGNOSIS — Z87891 Personal history of nicotine dependence: Secondary | ICD-10-CM | POA: Diagnosis not present

## 2021-11-12 DIAGNOSIS — G8929 Other chronic pain: Secondary | ICD-10-CM | POA: Insufficient documentation

## 2021-11-12 DIAGNOSIS — D631 Anemia in chronic kidney disease: Secondary | ICD-10-CM | POA: Diagnosis not present

## 2021-11-12 NOTE — Progress Notes (Signed)
[place], [place] [address]   CLINIC:  Medical Oncology/Hematology  PCP:  [name], MD [address] / [place] [address]  [phone]  REASON FOR VISIT:  Follow-up for rectal mass, MALT lymphoma, normocytic anemia, and MGUS  PRIOR THERAPY: none  CURRENT THERAPY: under work-up  INTERVAL HISTORY:  [name], a 81 y.o. male, returns for routine follow-up for his rectal mass, MALT lymphoma, normocytic anemia, and MGUS. Bradley Sheppard was last seen on 09/16/2021.  Today he reports feeling well. He saw Dr. Johney Maine yesterday, and he reports he is being scheduled for surgery to remove the rectal mass. He reports rectal pain. He denies hematochezia.   REVIEW OF SYSTEMS:  Review of Systems  Constitutional:  Positive for fatigue. Negative for appetite change.  Respiratory:  Positive for shortness of breath.   Cardiovascular:  Positive for chest pain and leg swelling.  Gastrointestinal:  Positive for diarrhea and rectal pain. Negative for blood in stool.  Musculoskeletal:  Positive for arthralgias (6/10 legs and shoulders).  Neurological:  Positive for numbness (feet).  Psychiatric/Behavioral:  Positive for depression and sleep disturbance. The patient is nervous/anxious.   All other systems reviewed and are negative.   PAST MEDICAL/SURGICAL HISTORY:  Past Medical History:  Diagnosis Date   Anxiety    Arthritis    Asthma    Chronic pain    CKD (chronic kidney disease) stage 3, GFR 30-59 ml/min (HCC)    Coronary atherosclerosis of native coronary artery    Multvessel, DES to LAD and RCA 8/03; EF 65% by echo 11/2014   DM2 (diabetes mellitus, type 2) ([place])    Essential hypertension    Gastritis    Related to NSAIDs   GERD (gastroesophageal reflux disease)    Iron deficiency anemia    Iron deficiency anemia 07/28/2021   Mitral valve regurgitation    Mixed hyperlipidemia    Myocardial infarction [facility])    Nephrolithiasis    Past  Surgical History:  Procedure Laterality Date   APPENDECTOMY     BIOPSY  09/25/2021   Procedure: BIOPSY;  Surgeon: Daneil Dolin, MD;  Location: [facility];  Service: Endoscopy;;   CARDIAC CATHETERIZATION  01/2002   90% obstruction in proximal LAD, 60 % obstruction in proximal circumflex and 70%  in proximal RCA. LAD & RCA stented  stents x 2   COLONOSCOPY WITH PROPOFOL N/A 09/25/2021   Procedure: COLONOSCOPY WITH PROPOFOL;  Surgeon: Daneil Dolin, MD;  Location: [facility];  Service: Endoscopy;  Laterality: N/A;  11:00am   L knee replacement  2009   Subsequent infectino requiring resectino arthroplasty with incision and drainage 8/10, and reimplantizathion arthroplasty, 9/20. 5 total surgeries.    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         Social Determinants  of Health   Financial Resource Strain: Not on file  Food Insecurity: Not on file  Transportation Needs: Not on file  Physical Activity: Not on file  Stress: Not on file  Social Connections: Not on file  Intimate Partner Violence: Not on file    FAMILY HISTORY:  Family History  Problem Relation Age of Onset   Diabetes Mother    Aneurysm Mother        Brain   Stroke Father    Stroke Other    Colon cancer Neg Hx     CURRENT MEDICATIONS:  Current Outpatient Medications  Medication Sig Dispense Refill   acetaminophen (TYLENOL) 500 MG tablet Take 500 mg by mouth every 6 (six) hours as needed.      amLODipine (NORVASC) 10 MG tablet Take 10 mg by mouth daily.     aspirin 81 MG tablet Take 81 mg by mouth daily.     carvedilol (COREG) 12.5 MG tablet Take 18.75 mg by mouth 2 (two) times daily.     chlorthalidone (HYGROTON) 25 MG tablet Take 25 mg by mouth daily.     glipiZIDE (GLUCOTROL XL) 10 MG 24 hr tablet Take 10 mg by mouth 2 (two) times daily.     hydrALAZINE (APRESOLINE) 25 MG tablet Take 1 tablet (25 mg total) by mouth 3 (three) times daily. 270 tablet 3   IRON PO Take 1 tablet by mouth 2 (two) times daily.     Lancets (ONETOUCH DELICA PLUS [id number]) MISC Apply topically.     LORazepam (ATIVAN) 1 MG tablet Take 1 mg by mouth at bedtime.     metFORMIN (GLUCOPHAGE) 500 MG tablet Take 500-1,000 mg by mouth See admin instructions. Take 1000 mg in the morning and 500 mg at night     nitroGLYCERIN (NITROSTAT) 0.4 MG SL tablet Place 1 tablet (0.4 mg total) under the tongue every 5 (five) minutes as needed. 25 tablet 3   ONETOUCH VERIO test strip 2 (two) times daily. use for testing     pioglitazone (ACTOS) 45 MG tablet Take 45 mg by mouth daily.     polyethylene glycol-electrolytes (TRILYTE) 420 g solution Take 4,000 mLs by mouth as directed. 4000 mL 0   rosuvastatin (CRESTOR) 20 MG tablet Take 20 mg by mouth daily.     Semaglutide (OZEMPIC, 0.25 OR 0.5 MG/DOSE, [place]) Inject 0.25 mLs into the skin every Wednesday.     tiZANidine (ZANAFLEX) 2 MG tablet Take 2 mg by mouth 2 (two) times daily.     valsartan (DIOVAN) 320 MG tablet Take 320 mg by mouth daily.     vitamin B-12 (CYANOCOBALAMIN) 500 MCG tablet Take 500 mcg by mouth daily.     vitamin C (ASCORBIC ACID) 500 MG tablet Take 500 mg by mouth daily.     No current facility-administered medications for this visit.    ALLERGIES:  Allergies  Allergen Reactions   Prednisone     "makes him feel faint"    PHYSICAL EXAM:  Performance status (ECOG): 2 - Symptomatic, <50% confined to bed  Vitals:   11/12/21 1125  BP: (!) 111/47   Pulse: 61  Resp: 16  Temp: (!) 97.5 F (36.4 C)  SpO2: 97%   Wt Readings from Last 3 Encounters:  10/06/21 207 lb (93.9 kg)  09/25/21 203 lb (92.1 kg)  09/23/21 204 lb (92.5 kg)   Physical Exam Vitals reviewed.  Constitutional:      Appearance: Normal appearance.     Comments: In wheelchair  Cardiovascular:     Rate and Rhythm: Normal rate and regular rhythm.     Pulses: Normal pulses.     Heart sounds: Normal heart sounds.  Pulmonary:     Effort: Pulmonary effort is normal.     Breath sounds: Normal breath sounds.  Neurological:     General: No focal deficit present.     Mental Status: He is alert and oriented to person, place, and time.  Psychiatric:        Mood and Affect: Mood normal.        Behavior: Behavior normal.     LABORATORY DATA:  I have reviewed the labs as listed.     Latest Ref Rng & Units 11/07/2021   11:04 AM 09/23/2021    9:28 AM 09/02/2021    7:57 AM  CBC  WBC 4.0 - 10.5 K/uL 8.1  7.7  7.2   Hemoglobin 13.0 - 17.0 g/dL 9.5  10.0  10.7   Hematocrit 39.0 - 52.0 % 29.5  30.7  32.8   Platelets 150 - 400 K/uL 235  232  226       Latest Ref Rng & Units 11/07/2021   11:04 AM 09/23/2021    9:28 AM 07/28/2021    1:58 PM  CMP  Glucose 70 - 99 mg/dL 158  118  234   BUN 8 - 23 mg/dL 40  42  40   Creatinine 0.61 - 1.24 mg/dL 1.28  1.30  1.53   Sodium 135 - 145 mmol/L 140  140  139   Potassium 3.5 - 5.1 mmol/L 4.2  4.3  4.3   Chloride 98 - 111 mmol/L 112  111  110   CO2 22 - 32 mmol/L '23  24  23   '$ Calcium 8.9 - 10.3 mg/dL 8.8  8.8  9.1   Total Protein 6.5 - 8.1 g/dL 7.5   7.6   Total Bilirubin 0.3 - 1.2 mg/dL 0.5   0.4   Alkaline Phos 38 - 126 U/L 78   65   AST 15 - 41 U/L 21   24   ALT 0 - 44 U/L 16   13       Component Value Date/Time   RBC 3.05 (L) 11/07/2021 1104   MCV 96.7 11/07/2021 1104   MCH 31.1 11/07/2021 1104   MCHC 32.2 11/07/2021 1104   RDW 14.6 11/07/2021 1104   LYMPHSABS 2.4 11/07/2021 1104   MONOABS 0.7 11/07/2021 1104   EOSABS  0.3 11/07/2021 1104   BASOSABS 0.0 11/07/2021 1104    DIAGNOSTIC IMAGING:  I have independently reviewed the scans and discussed with the patient. MR PELVIS WO CM RECTAL CA STAGING  Result Date: 11/10/2021 CLINICAL DATA:  Rectal carcinoma.  Local staging. EXAM: MRI PELVIS WITHOUT CONTRAST TECHNIQUE: Multiplanar multisequence MR imaging of the pelvis was performed. No intravenous contrast was administered. Rectal ultrasound gel could not be administered as it was not tolerated by the patient. COMPARISON:  None Available. FINDINGS: TUMOR LOCATION Evaluation limited by lack of rectal distention by ultrasound gel. Tumor distance from Anal Verge/Skin Surface:  3.0 cm Tumor distance to Internal Anal Sphincter: 0 cm; tumor does contact the internal sphincter TUMOR DESCRIPTION Circumferential Extent: 100% Tumor Length: 3.0 cm T - CATEGORY Extension through Muscularis Propria: No= T1/T2 Shortest Distance of any tumor/node from Mesorectal Fascia: N/a Extramural Vascular Invasion/Tumor Thrombus: No Invasion of Anterior Peritoneal Reflection: No Involvement of Adjacent Organs or Pelvic Sidewall: No Levator Ani Involvement: No N -  CATEGORY Mesorectal Lymph Nodes >=45m: None=N0 Extra-mesorectal Lymphadenopathy: 2 mildly enlarged left external iliac lymph nodes are seen which measure 1.3 cm and 1.6 cm in maximum diameter. Other:  Sigmoid diverticulosis, without evidence of diverticulitis. 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 Distance from tumor to the internal anal sphincter is 0 cm. Electronically Signed   By: JMarlaine HindM.D.   On: 11/10/2021 09:22     ASSESSMENT:  Normocytic anemia: - Patient seen at the request of Dr. VWoody Seller- CBC on 06/12/2021 with hemoglobin 9.1, MCV 94 with normal white count and platelet count.  Creatinine was 1.4. - CBC on 05/26/2021:  Hemoglobin-10, creatinine 1.56 - CBC on 02/24/2021: Hemoglobin-9.6 - [place] - He is currently taking iron tablet twice daily.  Mild constipation. - Last colonoscopy in 2010 with polyps. - BMBX (09/02/2021): Non-Hodgkin's lymphoma, favoring MALT lymphoma.  MYD 88 was negative. - Cytogenetics: 420 XY. - Myeloma FISH panel: Negative. - FISH for low-grade lymphoma: Negative for BCL6 rearrangement, MALT1 rearrangement, t(11;14) and t(14;18).    Social/family history: - He lives at home with his wife.  He walks with help of walker since his left knee replacement and multiple infections following it. 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  But chews tobacco at this time. - No family history of cancers.   PLAN:  [name]: - BMBX on 09/02/2021 showed non-Hodgkin's lymphoma with differential highly likely being MALT lymphoma. - PET scan on 08/28/2021: Prominent metabolic activity in the left lower rectum and anorectal region no skeletal lesions no adenopathy. - He is being evaluated for rectal mass.  As he does not have any B symptoms, we will keep the management of more lymphoma on hold at this time until rectal mass issue resolved.  2.  Normocytic anemia: - Anemia from CKD and relative iron deficiency.  Status post Feraheme completed on 08/11/2021.  Latest hemoglobin is 9.5.  Ferritin is 66. - We will consider more parenteral iron after surgery.  3.  IgG kappa monoclonal gammopathy: - He has 1.4 g of IgG kappa monoclonal spike, likely from MALT lymphoma process.  PET scan did not show any lytic lesions.  4.  Rectal mass: - He underwent colonoscopy by Dr. RGala Romney  Rectal mass was seen.  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 - MRI of the pelvis shows rectal adenocarcinoma P stage T1/2 N0.  2 mildly  enlarged left external iliac lymph nodes noted. - He was evaluated by Dr. GJohney Maine  Transanal microsurgery is being planned. - I have recommended follow-up with me in 1 month after surgery.  Orders placed this encounter:  No orders of the defined types were placed in this encounter.    SDerek Jack MD [phone]  I, KThana Ates am acting as a scribe for Dr. SDerek Jack  I, SDerek JackMD, have reviewed the above documentation for accuracy and completeness, and I agree with the above.

## 2021-11-12 NOTE — Patient Instructions (Signed)
[place] at [facility] Discharge Instructions   You were seen and examined today by Dr. Delton Coombes.  He discussed the results of your lab work which are normal/stable.   Dr. Raliegh Ip agrees with the plan to proceed with surgery with Dr. Johney Maine. We will see you back 4 weeks after surgery to discuss treatment of your lymphoma.   Return as scheduled.    Thank you for choosing [place] at [facility] to provide your oncology and hematology care.  To afford each patient quality time with our provider, please arrive at least 15 minutes before your scheduled appointment time.   If you have a lab appointment with the [place] please come in thru the Main Entrance and check in at the main information desk.  You need to re-schedule your appointment should you arrive 10 or more minutes late.  We strive to give you quality time with our providers, and arriving late affects you and other patients whose appointments are after yours.  Also, if you no show three or more times for appointments you may be dismissed from the clinic at the providers discretion.     Again, thank you for choosing [facility].  Our hope is that these requests will decrease the amount of time that you wait before being seen by our physicians.       _____________________________________________________________  Should you have questions after your visit to [facility], please contact our office at [phone] and follow the prompts.  Our office hours are 8:00 a.m. and 4:30 p.m. Monday - Friday.  Please note that voicemails left after 4:00 p.m. may not be returned until the following business day.  We are closed weekends and major holidays.  You do have access to a nurse 24-7, just call the main number to the clinic [phone] and do not press any options, hold on the line and a nurse will answer the phone.    For prescription refill requests, have your  pharmacy contact our office and allow 72 hours.    Due to Covid, you will need to wear a mask upon entering the hospital. If you do not have a mask, a mask will be given to you at the Main Entrance upon arrival. For doctor visits, patients may have 1 support person age 47 or older with them. For treatment visits, patients can not have anyone with them due to social distancing guidelines and our immunocompromised population.

## 2021-11-14 DIAGNOSIS — C884 Extranodal marginal zone B-cell lymphoma of mucosa-associated lymphoid tissue [MALT-lymphoma]: Secondary | ICD-10-CM | POA: Diagnosis not present

## 2021-11-14 DIAGNOSIS — M62838 Other muscle spasm: Secondary | ICD-10-CM | POA: Diagnosis not present

## 2021-11-14 DIAGNOSIS — M179 Osteoarthritis of knee, unspecified: Secondary | ICD-10-CM | POA: Diagnosis not present

## 2021-11-23 ENCOUNTER — Emergency Department (HOSPITAL_COMMUNITY): Payer: PPO

## 2021-11-23 ENCOUNTER — Encounter (HOSPITAL_COMMUNITY): Payer: Self-pay | Admitting: Hematology

## 2021-11-23 ENCOUNTER — Inpatient Hospital Stay (HOSPITAL_COMMUNITY)
Admission: EM | Admit: 2021-11-23 | Discharge: 2021-12-01 | DRG: 291 | Disposition: A | Discharge status: Home Health | Payer: PPO | Attending: Internal Medicine | Admitting: Internal Medicine

## 2021-11-23 ENCOUNTER — Encounter (HOSPITAL_COMMUNITY): Payer: Self-pay

## 2021-11-23 ENCOUNTER — Other Ambulatory Visit: Payer: Self-pay

## 2021-11-23 DIAGNOSIS — I10 Essential (primary) hypertension: Secondary | ICD-10-CM | POA: Diagnosis not present

## 2021-11-23 DIAGNOSIS — D649 Anemia, unspecified: Secondary | ICD-10-CM

## 2021-11-23 DIAGNOSIS — F419 Anxiety disorder, unspecified: Secondary | ICD-10-CM | POA: Diagnosis present

## 2021-11-23 DIAGNOSIS — I339 Acute and subacute endocarditis, unspecified: Secondary | ICD-10-CM | POA: Diagnosis not present

## 2021-11-23 DIAGNOSIS — I472 Ventricular tachycardia, unspecified: Secondary | ICD-10-CM | POA: Diagnosis not present

## 2021-11-23 DIAGNOSIS — R778 Other specified abnormalities of plasma proteins: Secondary | ICD-10-CM

## 2021-11-23 DIAGNOSIS — I440 Atrioventricular block, first degree: Secondary | ICD-10-CM | POA: Diagnosis present

## 2021-11-23 DIAGNOSIS — K6289 Other specified diseases of anus and rectum: Secondary | ICD-10-CM | POA: Diagnosis present

## 2021-11-23 DIAGNOSIS — C884 Extranodal marginal zone b-cell lymphoma of mucosa-associated lymphoid tissue (malt-lymphoma) not having achieved remission: Secondary | ICD-10-CM | POA: Diagnosis present

## 2021-11-23 DIAGNOSIS — N179 Acute kidney failure, unspecified: Secondary | ICD-10-CM | POA: Diagnosis not present

## 2021-11-23 DIAGNOSIS — I700 Atherosclerosis of aorta: Secondary | ICD-10-CM | POA: Diagnosis not present

## 2021-11-23 DIAGNOSIS — J45909 Unspecified asthma, uncomplicated: Secondary | ICD-10-CM | POA: Diagnosis not present

## 2021-11-23 DIAGNOSIS — N182 Chronic kidney disease, stage 2 (mild): Secondary | ICD-10-CM

## 2021-11-23 DIAGNOSIS — I5031 Acute diastolic (congestive) heart failure: Secondary | ICD-10-CM | POA: Diagnosis not present

## 2021-11-23 DIAGNOSIS — N1832 Chronic kidney disease, stage 3b: Secondary | ICD-10-CM

## 2021-11-23 DIAGNOSIS — E8809 Other disorders of plasma-protein metabolism, not elsewhere classified: Secondary | ICD-10-CM | POA: Diagnosis present

## 2021-11-23 DIAGNOSIS — Z7982 Long term (current) use of aspirin: Secondary | ICD-10-CM

## 2021-11-23 DIAGNOSIS — J811 Chronic pulmonary edema: Secondary | ICD-10-CM | POA: Diagnosis not present

## 2021-11-23 DIAGNOSIS — D509 Iron deficiency anemia, unspecified: Secondary | ICD-10-CM | POA: Diagnosis present

## 2021-11-23 DIAGNOSIS — Z888 Allergy status to other drugs, medicaments and biological substances status: Secondary | ICD-10-CM

## 2021-11-23 DIAGNOSIS — I130 Hypertensive heart and chronic kidney disease with heart failure and stage 1 through stage 4 chronic kidney disease, or unspecified chronic kidney disease: Principal | ICD-10-CM | POA: Diagnosis present

## 2021-11-23 DIAGNOSIS — E782 Mixed hyperlipidemia: Secondary | ICD-10-CM | POA: Diagnosis not present

## 2021-11-23 DIAGNOSIS — I25119 Atherosclerotic heart disease of native coronary artery with unspecified angina pectoris: Secondary | ICD-10-CM | POA: Diagnosis not present

## 2021-11-23 DIAGNOSIS — I5033 Acute on chronic diastolic (congestive) heart failure: Secondary | ICD-10-CM | POA: Diagnosis not present

## 2021-11-23 DIAGNOSIS — I3139 Other pericardial effusion (noninflammatory): Secondary | ICD-10-CM | POA: Diagnosis present

## 2021-11-23 DIAGNOSIS — C8590 Non-Hodgkin lymphoma, unspecified, unspecified site: Secondary | ICD-10-CM | POA: Diagnosis not present

## 2021-11-23 DIAGNOSIS — L8990 Pressure ulcer of unspecified site, unspecified stage: Secondary | ICD-10-CM | POA: Insufficient documentation

## 2021-11-23 DIAGNOSIS — N289 Disorder of kidney and ureter, unspecified: Secondary | ICD-10-CM

## 2021-11-23 DIAGNOSIS — E1122 Type 2 diabetes mellitus with diabetic chronic kidney disease: Secondary | ICD-10-CM | POA: Diagnosis not present

## 2021-11-23 DIAGNOSIS — D631 Anemia in chronic kidney disease: Secondary | ICD-10-CM | POA: Diagnosis present

## 2021-11-23 DIAGNOSIS — I248 Other forms of acute ischemic heart disease: Secondary | ICD-10-CM | POA: Diagnosis present

## 2021-11-23 DIAGNOSIS — K219 Gastro-esophageal reflux disease without esophagitis: Secondary | ICD-10-CM | POA: Diagnosis present

## 2021-11-23 DIAGNOSIS — D508 Other iron deficiency anemias: Secondary | ICD-10-CM | POA: Diagnosis not present

## 2021-11-23 DIAGNOSIS — R0602 Shortness of breath: Secondary | ICD-10-CM | POA: Diagnosis not present

## 2021-11-23 DIAGNOSIS — Z955 Presence of coronary angioplasty implant and graft: Secondary | ICD-10-CM

## 2021-11-23 DIAGNOSIS — Z7984 Long term (current) use of oral hypoglycemic drugs: Secondary | ICD-10-CM

## 2021-11-23 DIAGNOSIS — I330 Acute and subacute infective endocarditis: Secondary | ICD-10-CM | POA: Diagnosis present

## 2021-11-23 DIAGNOSIS — I6523 Occlusion and stenosis of bilateral carotid arteries: Secondary | ICD-10-CM | POA: Diagnosis not present

## 2021-11-23 DIAGNOSIS — R590 Localized enlarged lymph nodes: Secondary | ICD-10-CM | POA: Diagnosis present

## 2021-11-23 DIAGNOSIS — M1990 Unspecified osteoarthritis, unspecified site: Secondary | ICD-10-CM | POA: Diagnosis present

## 2021-11-23 DIAGNOSIS — I252 Old myocardial infarction: Secondary | ICD-10-CM

## 2021-11-23 DIAGNOSIS — T502X5A Adverse effect of carbonic-anhydrase inhibitors, benzothiadiazides and other diuretics, initial encounter: Secondary | ICD-10-CM | POA: Diagnosis not present

## 2021-11-23 DIAGNOSIS — Z96652 Presence of left artificial knee joint: Secondary | ICD-10-CM | POA: Diagnosis present

## 2021-11-23 DIAGNOSIS — I110 Hypertensive heart disease with heart failure: Secondary | ICD-10-CM | POA: Diagnosis not present

## 2021-11-23 DIAGNOSIS — R109 Unspecified abdominal pain: Secondary | ICD-10-CM | POA: Diagnosis not present

## 2021-11-23 DIAGNOSIS — I2510 Atherosclerotic heart disease of native coronary artery without angina pectoris: Secondary | ICD-10-CM | POA: Diagnosis present

## 2021-11-23 DIAGNOSIS — R748 Abnormal levels of other serum enzymes: Secondary | ICD-10-CM | POA: Diagnosis not present

## 2021-11-23 DIAGNOSIS — Z823 Family history of stroke: Secondary | ICD-10-CM

## 2021-11-23 DIAGNOSIS — J90 Pleural effusion, not elsewhere classified: Secondary | ICD-10-CM | POA: Diagnosis not present

## 2021-11-23 DIAGNOSIS — Z87891 Personal history of nicotine dependence: Secondary | ICD-10-CM

## 2021-11-23 DIAGNOSIS — E1165 Type 2 diabetes mellitus with hyperglycemia: Secondary | ICD-10-CM | POA: Diagnosis present

## 2021-11-23 DIAGNOSIS — Z79899 Other long term (current) drug therapy: Secondary | ICD-10-CM

## 2021-11-23 DIAGNOSIS — Z87442 Personal history of urinary calculi: Secondary | ICD-10-CM

## 2021-11-23 DIAGNOSIS — J9811 Atelectasis: Secondary | ICD-10-CM | POA: Diagnosis not present

## 2021-11-23 DIAGNOSIS — R7401 Elevation of levels of liver transaminase levels: Secondary | ICD-10-CM | POA: Diagnosis present

## 2021-11-23 DIAGNOSIS — D128 Benign neoplasm of rectum: Secondary | ICD-10-CM | POA: Diagnosis present

## 2021-11-23 DIAGNOSIS — R846 Abnormal cytological findings in specimens from respiratory organs and thorax: Secondary | ICD-10-CM | POA: Diagnosis not present

## 2021-11-23 DIAGNOSIS — E11649 Type 2 diabetes mellitus with hypoglycemia without coma: Secondary | ICD-10-CM

## 2021-11-23 DIAGNOSIS — G8929 Other chronic pain: Secondary | ICD-10-CM | POA: Diagnosis present

## 2021-11-23 DIAGNOSIS — I509 Heart failure, unspecified: Secondary | ICD-10-CM | POA: Diagnosis not present

## 2021-11-23 DIAGNOSIS — R509 Fever, unspecified: Secondary | ICD-10-CM | POA: Diagnosis not present

## 2021-11-23 DIAGNOSIS — I350 Nonrheumatic aortic (valve) stenosis: Secondary | ICD-10-CM | POA: Diagnosis not present

## 2021-11-23 DIAGNOSIS — E119 Type 2 diabetes mellitus without complications: Secondary | ICD-10-CM

## 2021-11-23 DIAGNOSIS — I447 Left bundle-branch block, unspecified: Secondary | ICD-10-CM | POA: Diagnosis present

## 2021-11-23 DIAGNOSIS — I5032 Chronic diastolic (congestive) heart failure: Secondary | ICD-10-CM

## 2021-11-23 DIAGNOSIS — Z833 Family history of diabetes mellitus: Secondary | ICD-10-CM

## 2021-11-23 DIAGNOSIS — Z7401 Bed confinement status: Secondary | ICD-10-CM

## 2021-11-23 LAB — CBC WITH DIFFERENTIAL/PLATELET
Abs Immature Granulocytes: 0.05 10*3/uL (ref 0.00–0.07)
Basophils Absolute: 0 10*3/uL (ref 0.0–0.1)
Basophils Relative: 0 %
Eosinophils Absolute: 0.1 10*3/uL (ref 0.0–0.5)
Eosinophils Relative: 1 %
HCT: 30.1 % — ABNORMAL LOW (ref 39.0–52.0)
Hemoglobin: 9.8 g/dL — ABNORMAL LOW (ref 13.0–17.0)
Immature Granulocytes: 1 %
Lymphocytes Relative: 19 %
Lymphs Abs: 1.8 10*3/uL (ref 0.7–4.0)
MCH: 31.5 pg (ref 26.0–34.0)
MCHC: 32.6 g/dL (ref 30.0–36.0)
MCV: 96.8 fL (ref 80.0–100.0)
Monocytes Absolute: 0.9 10*3/uL (ref 0.1–1.0)
Monocytes Relative: 10 %
Neutro Abs: 6.6 10*3/uL (ref 1.7–7.7)
Neutrophils Relative %: 69 %
Platelets: 257 10*3/uL (ref 150–400)
RBC: 3.11 MIL/uL — ABNORMAL LOW (ref 4.22–5.81)
RDW: 13.8 % (ref 11.5–15.5)
WBC: 9.5 10*3/uL (ref 4.0–10.5)
nRBC: 0 % (ref 0.0–0.2)

## 2021-11-23 LAB — BASIC METABOLIC PANEL
Anion gap: 4 — ABNORMAL LOW (ref 5–15)
BUN: 48 mg/dL — ABNORMAL HIGH (ref 8–23)
CO2: 22 mmol/L (ref 22–32)
Calcium: 8.6 mg/dL — ABNORMAL LOW (ref 8.9–10.3)
Chloride: 111 mmol/L (ref 98–111)
Creatinine, Ser: 1.65 mg/dL — ABNORMAL HIGH (ref 0.61–1.24)
GFR, Estimated: 41 mL/min — ABNORMAL LOW (ref >60)
Glucose, Bld: 220 mg/dL — ABNORMAL HIGH (ref 70–99)
Potassium: 4.5 mmol/L (ref 3.5–5.1)
Sodium: 137 mmol/L (ref 135–145)

## 2021-11-23 LAB — PROTIME-INR
INR: 1.1 (ref 0.8–1.2)
Prothrombin Time: 13.6 seconds (ref 11.4–15.2)

## 2021-11-23 LAB — TROPONIN I (HIGH SENSITIVITY): Troponin I (High Sensitivity): 82 ng/L — ABNORMAL HIGH (ref <18)

## 2021-11-23 LAB — GLUCOSE, CAPILLARY
Glucose-Capillary: 197 mg/dL — ABNORMAL HIGH (ref 70–99)
Glucose-Capillary: 227 mg/dL — ABNORMAL HIGH (ref 70–99)
Glucose-Capillary: 241 mg/dL — ABNORMAL HIGH (ref 70–99)

## 2021-11-23 LAB — BRAIN NATRIURETIC PEPTIDE: B Natriuretic Peptide: 308 pg/mL — ABNORMAL HIGH (ref 0.0–100.0)

## 2021-11-23 LAB — D-DIMER, QUANTITATIVE: D-Dimer, Quant: 2.4 ug/mL-FEU — ABNORMAL HIGH (ref 0.00–0.50)

## 2021-11-23 LAB — HEMOGLOBIN A1C
Hgb A1c MFr Bld: 7.6 % — ABNORMAL HIGH (ref 4.8–5.6)
Mean Plasma Glucose: 171.42 mg/dL

## 2021-11-23 LAB — PHOSPHORUS: Phosphorus: 3.9 mg/dL (ref 2.5–4.6)

## 2021-11-23 LAB — MAGNESIUM: Magnesium: 2.1 mg/dL (ref 1.7–2.4)

## 2021-11-23 MED ORDER — SENNOSIDES-DOCUSATE SODIUM 8.6-50 MG PO TABS: 1.0000 | ORAL_TABLET | Freq: Every evening | ORAL | Status: DC | PRN

## 2021-11-23 MED ORDER — ACETAMINOPHEN 650 MG RE SUPP: 650.0000 mg | Freq: Four times a day (QID) | RECTAL | Status: DC | PRN

## 2021-11-23 MED ORDER — VITAMIN B-12 100 MCG PO TABS
500.0000 ug | ORAL_TABLET | Freq: Every day | ORAL | Status: DC
  Administered 2021-11-23 – 2021-12-01 (×9): 500 ug via ORAL
  Filled 2021-11-23 (×9): qty 5

## 2021-11-23 MED ORDER — SODIUM CHLORIDE 0.9% FLUSH
3.0000 mL | Freq: Two times a day (BID) | INTRAVENOUS | Status: DC
  Administered 2021-11-23: 3 mL via INTRAVENOUS

## 2021-11-23 MED ORDER — SODIUM CHLORIDE 0.9 % IV SOLN: 250.0000 mL | INTRAVENOUS | Status: DC | PRN

## 2021-11-23 MED ORDER — ONDANSETRON HCL 4 MG PO TABS: 4.0000 mg | ORAL_TABLET | Freq: Four times a day (QID) | ORAL | Status: DC | PRN

## 2021-11-23 MED ORDER — FERROUS SULFATE 325 (65 FE) MG PO TABS
325.0000 mg | ORAL_TABLET | Freq: Two times a day (BID) | ORAL | Status: DC
  Administered 2021-11-24 – 2021-12-01 (×15): 325 mg via ORAL
  Filled 2021-11-23 (×15): qty 1

## 2021-11-23 MED ORDER — ALBUMIN HUMAN 25 % IV SOLN
25.0000 g | Freq: Four times a day (QID) | INTRAVENOUS | Status: AC
  Administered 2021-11-23 (×2): 25 g via INTRAVENOUS
  Filled 2021-11-23 (×4): qty 100

## 2021-11-23 MED ORDER — SODIUM CHLORIDE 0.9 % IV SOLN
250.0000 mg | Freq: Once | INTRAVENOUS | Status: AC
  Administered 2021-11-23: 250 mg via INTRAVENOUS
  Filled 2021-11-23: qty 20

## 2021-11-23 MED ORDER — NITROGLYCERIN 0.4 MG SL SUBL: 0.4000 mg | SUBLINGUAL_TABLET | SUBLINGUAL | Status: DC | PRN

## 2021-11-23 MED ORDER — FUROSEMIDE 10 MG/ML IJ SOLN
40.0000 mg | Freq: Two times a day (BID) | INTRAMUSCULAR | Status: DC
  Administered 2021-11-23 – 2021-11-24 (×3): 40 mg via INTRAVENOUS
  Filled 2021-11-23 (×3): qty 4

## 2021-11-23 MED ORDER — INSULIN ASPART 100 UNIT/ML IJ SOLN
0.0000 [IU] | Freq: Three times a day (TID) | INTRAMUSCULAR | Status: DC
  Administered 2021-11-23: 3 [IU] via SUBCUTANEOUS
  Administered 2021-11-24: 5 [IU] via SUBCUTANEOUS
  Administered 2021-11-24: 1 [IU] via SUBCUTANEOUS
  Administered 2021-11-24: 2 [IU] via SUBCUTANEOUS
  Administered 2021-11-25: 5 [IU] via SUBCUTANEOUS
  Administered 2021-11-25: 1 [IU] via SUBCUTANEOUS
  Administered 2021-11-25 – 2021-11-26 (×2): 2 [IU] via SUBCUTANEOUS
  Administered 2021-11-26: 1 [IU] via SUBCUTANEOUS
  Administered 2021-11-26: 5 [IU] via SUBCUTANEOUS
  Administered 2021-11-27: 3 [IU] via SUBCUTANEOUS
  Administered 2021-11-27: 5 [IU] via SUBCUTANEOUS
  Administered 2021-11-27: 2 [IU] via SUBCUTANEOUS
  Administered 2021-11-28: 3 [IU] via SUBCUTANEOUS
  Administered 2021-11-28: 2 [IU] via SUBCUTANEOUS
  Administered 2021-11-28: 1 [IU] via SUBCUTANEOUS
  Administered 2021-11-29: 2 [IU] via SUBCUTANEOUS
  Administered 2021-11-29: 1 [IU] via SUBCUTANEOUS
  Administered 2021-11-29: 2 [IU] via SUBCUTANEOUS
  Administered 2021-11-30: 3 [IU] via SUBCUTANEOUS
  Administered 2021-11-30: 2 [IU] via SUBCUTANEOUS
  Administered 2021-11-30: 1 [IU] via SUBCUTANEOUS
  Administered 2021-12-01: 2 [IU] via SUBCUTANEOUS
  Administered 2021-12-01: 3 [IU] via SUBCUTANEOUS

## 2021-11-23 MED ORDER — HYDRALAZINE HCL 20 MG/ML IJ SOLN: 10.0000 mg | INTRAMUSCULAR | Status: DC | PRN

## 2021-11-23 MED ORDER — SODIUM CHLORIDE 0.9% FLUSH: 3.0000 mL | INTRAVENOUS | Status: DC | PRN

## 2021-11-23 MED ORDER — FLEET ENEMA 7-19 GM/118ML RE ENEM: 1.0000 | ENEMA | Freq: Once | RECTAL | Status: DC | PRN

## 2021-11-23 MED ORDER — ONDANSETRON HCL 4 MG/2ML IJ SOLN: 4.0000 mg | Freq: Four times a day (QID) | INTRAMUSCULAR | Status: DC | PRN

## 2021-11-23 MED ORDER — OXYCODONE HCL 5 MG PO TABS
5.0000 mg | ORAL_TABLET | ORAL | Status: DC | PRN
  Administered 2021-11-27 – 2021-11-28 (×4): 5 mg via ORAL
  Filled 2021-11-23 (×5): qty 1

## 2021-11-23 MED ORDER — IOHEXOL 350 MG/ML SOLN
60.0000 mL | Freq: Once | INTRAVENOUS | Status: AC | PRN
  Administered 2021-11-23: 60 mL via INTRAVENOUS

## 2021-11-23 MED ORDER — IPRATROPIUM BROMIDE 0.02 % IN SOLN
0.5000 mg | Freq: Four times a day (QID) | RESPIRATORY_TRACT | Status: DC | PRN
  Administered 2021-11-23 – 2021-11-24 (×2): 0.5 mg via RESPIRATORY_TRACT
  Filled 2021-11-23 (×2): qty 2.5

## 2021-11-23 MED ORDER — SODIUM CHLORIDE 0.9% FLUSH
3.0000 mL | Freq: Two times a day (BID) | INTRAVENOUS | Status: DC
  Administered 2021-11-23 – 2021-12-01 (×16): 3 mL via INTRAVENOUS

## 2021-11-23 MED ORDER — TRAZODONE HCL 50 MG PO TABS: 25.0000 mg | ORAL_TABLET | Freq: Every evening | ORAL | Status: DC | PRN

## 2021-11-23 MED ORDER — LEVALBUTEROL HCL 0.63 MG/3ML IN NEBU
0.6300 mg | INHALATION_SOLUTION | Freq: Four times a day (QID) | RESPIRATORY_TRACT | Status: DC | PRN
  Administered 2021-11-23 – 2021-11-24 (×2): 0.63 mg via RESPIRATORY_TRACT
  Filled 2021-11-23 (×2): qty 3

## 2021-11-23 MED ORDER — LORAZEPAM 1 MG PO TABS
1.0000 mg | ORAL_TABLET | Freq: Every day | ORAL | Status: DC
  Administered 2021-11-23 – 2021-11-30 (×7): 1 mg via ORAL
  Filled 2021-11-23 (×8): qty 1

## 2021-11-23 MED ORDER — FUROSEMIDE 10 MG/ML IJ SOLN
40.0000 mg | Freq: Once | INTRAMUSCULAR | Status: AC
  Administered 2021-11-23: 40 mg via INTRAVENOUS
  Filled 2021-11-23: qty 4

## 2021-11-23 MED ORDER — AMLODIPINE BESYLATE 5 MG PO TABS
10.0000 mg | ORAL_TABLET | Freq: Every day | ORAL | Status: DC
  Administered 2021-11-23 – 2021-11-28 (×6): 10 mg via ORAL
  Filled 2021-11-23 (×6): qty 2

## 2021-11-23 MED ORDER — HEPARIN SODIUM (PORCINE) 5000 UNIT/ML IJ SOLN
5000.0000 [IU] | Freq: Three times a day (TID) | INTRAMUSCULAR | Status: DC
Start: 2021-11-23 — End: 2021-12-01
  Administered 2021-11-23 – 2021-12-01 (×23): 5000 [IU] via SUBCUTANEOUS
  Filled 2021-11-23 (×23): qty 1

## 2021-11-23 MED ORDER — CARVEDILOL 3.125 MG PO TABS
18.7500 mg | ORAL_TABLET | Freq: Two times a day (BID) | ORAL | Status: DC
  Administered 2021-11-23 – 2021-11-29 (×13): 18.75 mg via ORAL
  Filled 2021-11-23 (×13): qty 2

## 2021-11-23 MED ORDER — HYDROMORPHONE HCL 1 MG/ML IJ SOLN: 0.5000 mg | INTRAMUSCULAR | Status: DC | PRN

## 2021-11-23 MED ORDER — ACETAMINOPHEN 325 MG PO TABS
650.0000 mg | ORAL_TABLET | Freq: Four times a day (QID) | ORAL | Status: DC | PRN
  Administered 2021-11-25 – 2021-11-28 (×3): 650 mg via ORAL
  Filled 2021-11-23 (×3): qty 2

## 2021-11-23 MED ORDER — ROSUVASTATIN CALCIUM 20 MG PO TABS
20.0000 mg | ORAL_TABLET | Freq: Every day | ORAL | Status: DC
Start: 2021-11-23 — End: 2021-11-30
  Administered 2021-11-23 – 2021-11-30 (×8): 20 mg via ORAL
  Filled 2021-11-23 (×8): qty 1

## 2021-11-23 MED ORDER — ASPIRIN 81 MG PO CHEW
81.0000 mg | CHEWABLE_TABLET | Freq: Every day | ORAL | Status: DC
Start: 2021-11-23 — End: 2021-12-01
  Administered 2021-11-23 – 2021-12-01 (×9): 81 mg via ORAL
  Filled 2021-11-23 (×9): qty 1

## 2021-11-23 MED ORDER — GLIPIZIDE ER 5 MG PO TB24
10.0000 mg | ORAL_TABLET | Freq: Two times a day (BID) | ORAL | Status: DC
  Administered 2021-11-23 – 2021-11-28 (×11): 10 mg via ORAL
  Filled 2021-11-23 (×12): qty 2

## 2021-11-23 MED ORDER — BISACODYL 5 MG PO TBEC: 5.0000 mg | DELAYED_RELEASE_TABLET | Freq: Every day | ORAL | Status: DC | PRN

## 2021-11-23 MED ORDER — DOCUSATE SODIUM 100 MG PO CAPS
100.0000 mg | ORAL_CAPSULE | Freq: Every day | ORAL | Status: DC
  Administered 2021-11-23 – 2021-12-01 (×9): 100 mg via ORAL
  Filled 2021-11-23 (×9): qty 1

## 2021-11-23 NOTE — Assessment & Plan Note (Addendum)
-   Stable,  resuming home medication Norvasc and Coreg with exception of hydralazine, valsartan...  -On IV Lasix>>po lasix -decreased dose coreg to 12.5 bid -will not restart hydralazine, ARB

## 2021-11-23 NOTE — Assessment & Plan Note (Addendum)
-  Status post ultrasound-guided thoracentesis 11/24/2021 Yielding 1.6 L of fluid, -Pleural fluid analysis pending  - CT angiogram reviewed moderate right> small left pleural effusion -May be contributing to shortness of breath

## 2021-11-23 NOTE — H&P (Addendum)
History and Physical   Patient: Bradley Sheppard                            PCP: [name], MD                    DOB: 1941-03-29            DOA: 11/23/2021 EZM:[id number]             DOS: 11/23/2021, 1:06 PM  Vyas, Costella Hatcher, MD  Patient coming from:   HOME  I have personally reviewed patient's medical records, in electronic medical records, including:  [place] [facility], and care everywhere.    Chief Complaint:   Chief Complaint  Patient presents with   Shortness of Breath    History of present illness:    Bradley Sheppard is a 81 year old male with extensive history of DM 2, HTN, HLD,MALT lymphoma, CAD presented to ED with chief complaint of shortness of breath -Patient reports shortness of breath has been going on for the past week, progressively getting worse especially with exertion. He denies any chest pain or cough.  Denies of having any recent illnesses States that he has CAD but has not been diagnosed with CHF.  And he is not O2 dependent.    ED course: Blood pressure 133/71, pulse 70, temperature 98.2 F (36.8 C), temperature source Oral, resp. rate 20, height 6' (1.829 m), weight 99.8 kg, SpO2 97 % on 2 L  CBC WBC 9.5, hemoglobin 9.8, CMP within normal limits with exception of BUN 48, creatinine 1.65, calcium 8.6, troponin 82 glucose 220 BNP 308, Total iron 44 D-dimer 2.4,  Chest x-ray: Moderate congestive heart failure CTA: 1. No CT evidence for acute pulmonary embolus. 2. Moderate right and small to moderate left pleural effusions with collapse/consolidation in both lower lobes. 3. Patchy bilateral ground-glass opacities, compatible with edema and/or infection. 4. Aortic Atherosclerosis      Patient Denies having: Fever, Chills, Cough, SOB, Chest Pain, Abd pain, N/V/D, headache, dizziness, lightheadedness,  Dysuria, Joint pain, rash, open wounds      Review of Systems: As per HPI, otherwise 10 point review of systems were negative.    ----------------------------------------------------------------------------------------------------------------------  Allergies  Allergen Reactions   Prednisone     "makes him feel faint"    Home MEDs:  Prior to Admission medications   Medication Sig Start Date End Date Taking? Authorizing Provider  acetaminophen (TYLENOL) 500 MG tablet Take 500 mg by mouth every 6 (six) hours as needed.    [provider]  amLODipine (NORVASC) 10 MG tablet Take 10 mg by mouth daily. 06/13/21   [provider]  aspirin 81 MG tablet Take 81 mg by mouth daily.    [provider]  carvedilol (COREG) 12.5 MG tablet Take 18.75 mg by mouth 2 (two) times daily. 12/19/20   [provider]  chlorthalidone (HYGROTON) 25 MG tablet Take 25 mg by mouth daily.    [provider]  glipiZIDE (GLUCOTROL XL) 10 MG 24 hr tablet Take 10 mg by mouth 2 (two) times daily. 12/19/20   [provider]  hydrALAZINE (APRESOLINE) 25 MG tablet Take 1 tablet (25 mg total) by mouth 3 (three) times daily. 10/06/21 01/04/22  Lenna Sciara, NP  IRON PO Take 1 tablet by mouth 2 (two) times daily.    [provider]  Lancets (ONETOUCH DELICA PLUS [id number]) [place] Apply topically.  08/07/21   [provider]  LORazepam (ATIVAN) 1 MG tablet Take 1 mg by mouth at bedtime.    [provider]  metFORMIN (GLUCOPHAGE) 500 MG tablet Take 500-1,000 mg by mouth See admin instructions. Take 1000 mg in the morning and 500 mg at night 08/16/21   [provider]  nitroGLYCERIN (NITROSTAT) 0.4 MG SL tablet Place 1 tablet (0.4 mg total) under the tongue every 5 (five) minutes as needed. 06/30/21   Satira Sark, MD  [facility] VERIO test strip 2 (two) times daily. use for testing 08/07/21   [provider]  pioglitazone (ACTOS) 45 MG tablet Take 45 mg by mouth daily.    [provider]  polyethylene glycol-electrolytes (TRILYTE) 420 g solution Take 4,000 mLs  by mouth as directed. 09/17/21   Rourk, Cristopher Estimable, MD  rosuvastatin (CRESTOR) 20 MG tablet Take 20 mg by mouth daily.    [provider]  Semaglutide (OZEMPIC, 0.25 OR 0.5 MG/DOSE, [facility]) Inject 0.25 mLs into the skin every Wednesday.    [provider]  valsartan (DIOVAN) 320 MG tablet Take 320 mg by mouth daily. 12/19/20   [provider]  vitamin B-12 (CYANOCOBALAMIN) 500 MCG tablet Take 500 mcg by mouth daily.    [provider]  vitamin C (ASCORBIC ACID) 500 MG tablet Take 500 mg by mouth daily.    [provider]    PRN MEDs: sodium chloride, acetaminophen **OR** acetaminophen, bisacodyl, hydrALAZINE, HYDROmorphone (DILAUDID) injection, ipratropium, levalbuterol, nitroGLYCERIN, ondansetron **OR** ondansetron (ZOFRAN) IV, oxyCODONE, senna-docusate, sodium chloride flush, sodium phosphate, traZODone  Past Medical History:  Diagnosis Date   Anxiety    Arthritis    Asthma    Chronic pain    CKD (chronic kidney disease) stage 3, GFR 30-59 ml/min (HCC)    Coronary atherosclerosis of native coronary artery    Multvessel, DES to LAD and RCA 8/03; EF 65% by echo 11/2014   DM2 (diabetes mellitus, type 2) ( [place])    Essential hypertension    Gastritis    Related to NSAIDs   GERD (gastroesophageal reflux disease)    Iron deficiency anemia    Iron deficiency anemia 07/28/2021   Mitral valve regurgitation    Mixed hyperlipidemia    Myocardial infarction [facility])    Nephrolithiasis     Past Surgical History:  Procedure Laterality Date   APPENDECTOMY     BIOPSY  09/25/2021   Procedure: BIOPSY;  Surgeon: Daneil Dolin, MD;  Location: [facility];  Service: Endoscopy;;   CARDIAC CATHETERIZATION  01/2002   90% obstruction in proximal LAD, 60 % obstruction in proximal circumflex and 70%  in proximal RCA. LAD & RCA stented  stents x 2   COLONOSCOPY WITH PROPOFOL N/A 09/25/2021   Procedure: COLONOSCOPY WITH PROPOFOL;  Surgeon: Daneil Dolin, MD;   Location: [facility];  Service: Endoscopy;  Laterality: N/A;  11:00am   L knee replacement  2009   Subsequent infectino requiring resectino arthroplasty with incision and drainage 8/10, and reimplantizathion arthroplasty, 9/20. 5 total surgeries.     reports that he has quit smoking. His smoking use included cigarettes. His smokeless tobacco use includes chew. He reports that he does not drink alcohol and does not use drugs.   Family History  Problem Relation Age of Onset   Diabetes Mother    Aneurysm Mother        Brain   Stroke Father    Stroke Other    Colon cancer Neg  Hx     Physical Exam:   Vitals:   11/23/21 1000 11/23/21 1015 11/23/21 1030 11/23/21 1120  BP: (!) 147/61  (!) 158/65 (!) 159/95  Pulse: 72 63 83 79  Resp:   20 19  Temp: 98.3 F (36.8 C)   97.9 F (36.6 C)  TempSrc: Oral   Oral  SpO2: 98% 99% 92% 98%  Weight:      Height:       Constitutional: NAD, calm, comfortable Eyes: PERRL, lids and conjunctivae normal ENMT: Mucous membranes are moist. Posterior pharynx clear of any exudate or lesions.Normal dentition.  Neck: normal, supple, no masses, no thyromegaly Respiratory: clear to auscultation bilaterally, no wheezing, no crackles. Normal respiratory effort. No accessory muscle use.  Cardiovascular: Regular rate and rhythm, no murmurs / rubs / gallops. +2 extremity edema. 2+ pedal pulses. No carotid bruits.  Abdomen: no tenderness, no masses palpated. No hepatosplenomegaly. Bowel sounds positive.  Musculoskeletal: no clubbing / cyanosis. No joint deformity upper and lower extremities. Good ROM, no contractures. Normal muscle tone.  Neurologic: CN II-XII grossly intact. Sensation intact, DTR normal. Strength 5/5 in all 4.  Psychiatric: Normal judgment and insight. Alert and oriented x 3. Normal mood.  Skin: no rashes, lesions, ulcers. No induration Decubitus/ulcers:  Wounds: per nursing documentation      Labs on admission:    I have personally  reviewed following labs and imaging studies  CBC: Recent Labs  Lab 11/23/21 0640  WBC 9.5  NEUTROABS 6.6  HGB 9.8*  HCT 30.1*  MCV 96.8  PLT 101   Basic Metabolic Panel: Recent Labs  Lab 11/23/21 0640  NA 137  K 4.5  CL 111  CO2 22  GLUCOSE 220*  BUN 48*  CREATININE 1.65*  CALCIUM 8.6*  MG 2.1  PHOS 3.9    Coagulation Profile: Recent Labs  Lab 11/23/21 0640  INR 1.1    Urine analysis:    Component Value Date/Time   COLORURINE YELLOW 12/26/2008 [place] 12/26/2008 0950   LABSPEC 1.016 12/26/2008 0950   PHURINE 5.5 12/26/2008 0950   GLUCOSEU NEGATIVE 12/26/2008 0950   HGBUR NEGATIVE 12/26/2008 0950   BILIRUBINUR NEGATIVE 12/26/2008 0950   KETONESUR NEGATIVE 12/26/2008 0950   PROTEINUR NEGATIVE 12/26/2008 0950   UROBILINOGEN 0.2 12/26/2008 0950   NITRITE NEGATIVE 12/26/2008 0950   LEUKOCYTESUR  12/26/2008 0950    NEGATIVE MICROSCOPIC NOT DONE ON URINES WITH NEGATIVE PROTEIN, BLOOD, LEUKOCYTES, NITRITE, OR GLUCOSE <1000 mg/dL.    Last A1C:  No results found for: "HGBA1C"   Radiologic Exams on Admission:   CT Angio Chest PE W and/or Wo Contrast  Result Date: 11/23/2021 CLINICAL DATA:  History of MALT lymphoma. Exertional dyspnea. Clinical concern for pulmonary embolus. EXAM: CT ANGIOGRAPHY CHEST WITH CONTRAST TECHNIQUE: Multidetector CT imaging of the chest was performed using the standard protocol during bolus administration of intravenous contrast. Multiplanar CT image reconstructions and MIPs were obtained to evaluate the vascular anatomy. RADIATION DOSE REDUCTION: This exam was performed according to the departmental dose-optimization program which includes automated exposure control, adjustment of the mA and/or kV according to patient size and/or use of iterative reconstruction technique. CONTRAST:  76m OMNIPAQUE IOHEXOL 350 MG/ML SOLN COMPARISON:  PET-CT 08/28/2021 FINDINGS: Cardiovascular: Heart is enlarged. Coronary artery  calcification is evident. Mild atherosclerotic calcification is noted in the wall of the thoracic aorta. There is no filling defect within the opacified pulmonary arteries to suggest the presence of an acute pulmonary embolus. Mediastinum/Nodes: No mediastinal  lymphadenopathy. There is no hilar lymphadenopathy. The esophagus has normal imaging features. There is no axillary lymphadenopathy. Lungs/Pleura: Fine architectural lung detail obscured by substantial breathing motion during image acquisition. Probable component of underlying emphysema. There is collapse/consolidation in both lower lobes with superimposed patchy bilateral ground-glass opacities. Moderate right and small to moderate left pleural effusions evident. Upper Abdomen: Unremarkable. Musculoskeletal: No worrisome lytic or sclerotic osseous abnormality. Review of the MIP images confirms the above findings. IMPRESSION: 1. No CT evidence for acute pulmonary embolus. 2. Moderate right and small to moderate left pleural effusions with collapse/consolidation in both lower lobes. 3. Patchy bilateral ground-glass opacities, compatible with edema and/or infection. 4. Aortic Atherosclerosis (ICD10-I70.0). Electronically Signed   By: Misty Stanley M.D.   On: 11/23/2021 08:59   DG Chest 2 View  Result Date: 11/23/2021 CLINICAL DATA:  Shortness of breath. EXAM: CHEST - 2 VIEW COMPARISON:  11/23/2021 FINDINGS: Stable cardiac enlargement. Aortic atherosclerotic calcifications. Small bilateral pleural effusions noted, right greater than left. Mild diffuse interstitial edema. No airspace consolidation. IMPRESSION: 1. Moderate congestive heart failure. Electronically Signed   By: Kerby Moors M.D.   On: 11/23/2021 07:08    EKG:   Independently reviewed.  Orders placed or performed during the hospital encounter of 11/23/21   ED EKG   ED EKG   EKG 12-Lead   EKG 12-Lead   EKG 12-Lead    ---------------------------------------------------------------------------------------------------------------------------------------    Assessment / Plan:   Principal Problem:   SOB (shortness of breath) Active Problems:   CHF (congestive heart failure), NYHA class I, acute, diastolic (HCC)   Bilateral pleural effusion   CKD (chronic kidney disease), stage II   DMII (diabetes mellitus, type 2) (HCC)   Rectal mass   Mixed hyperlipidemia   Essential hypertension, benign   CORONARY ATHEROSCLEROSIS NATIVE CORONARY ARTERY   Iron deficiency anemia   MALT lymphoma (HCC)   Assessment and Plan: * SOB (shortness of breath) - Likely due to new onset CHF and exacerbation, also bilateral pleural effusion -Elevated proBNP, a on CKD -CTA chest reviewed: No evidence of PE -Continue diuretics, as needed DuoNeb bronchodilators -Echocardiogram from March 2022 reviewed, ejection fraction preserved, LVH with dilated right atrial mild aortic stenosis -Continue supplemental oxygen, with plan to wean off oxygen Not oxygen dependent at baseline   Bilateral pleural effusion - CT angiogram reviewed moderate right> small left pleural effusion -We will attempt thoracentesis -May be contributing to shortness of breath  CHF (congestive heart failure), NYHA class I, acute, diastolic (HCC) -Since the be new onset -Presenting with shortness of breath, lower extremity edema -Elevated proBNP, in the setting of CKD Echo from 07/09/2020 reviewed, LVH, severely dilated right, ejection fraction 60,  mild AAS -Due to new onset of symptoms of CHF, repeating 2D echocardiogram>> -Consulting cardiology for evaluation -Recycle cardiac enzymes -Possible preserved ejection fraction diastolic failure -We will continue with IV diuretics 40 mg of Lasix twice daily -Daily weight -We will monitor I's and O's -We will monitor labs, kidney function - ReDs Clip reading      DMII (diabetes mellitus, type 2)  (HCC) - We will check CBG q. ACH S, with SSI coverage -Home medication of metformin, Ozempic, -Resuming Glucotrol -Anticipating discontinuing his home medication of Actos -Checking A1c  CKD (chronic kidney disease), stage II - Creatinine 1.65 >>> -BUN 48 -GFR 41 -Baseline creatinine this year 1.28- 1.53 Lab Results  Component Value Date   CREATININE 1.65 (H) 11/23/2021   CREATININE 1.28 (H) 11/07/2021   CREATININE 1.30 (  H) 09/23/2021   -Avoiding nephrotoxins -Unfortunately patient needs IV Lasix -We will continue monitor BUN/creatinine and GFR closely  Rectal mass -Being followed closely as an outpatient -Per patient and family planning for resection in August  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 iron supplements   CORONARY ATHEROSCLEROSIS NATIVE CORONARY ARTERY - Stable denies any chest pain -Continue as needed nitroglycerin, continue home medications of statins, beta-blocker and aspirin  Essential hypertension, benign - Stable resuming home medication Norvasc and Coreg with exception of hydralazine, valsartan... This patient will be on IV Lasix, and a on CKD  Mixed hyperlipidemia - Continue statins     Consults called:  None -------------------------------------------------------------------------------------------------------------------------------------------- DVT prophylaxis:  heparin injection 5,000 Units Start: 11/23/21 2200 TED hose Start: 11/23/21 0916 SCDs Start: 11/23/21 0915   Code Status:   Code Status: Full Code   Admission status: Patient will be admitted as Inpatient, with a greater than 2 midnight length of stay. Level of care: Telemetry   Family Communication:  none at bedside  (The above findings and plan of care has been discussed with patient in detail, the patient expressed understanding and agreement of above plan)   -----------------------------------------------------------------------------------------------------------------------------------------------  Disposition Plan:  Anticipated 1-2 days Status is: Inpatient Remains inpatient appropriate because:    --------------------------------------------------------------------------------------------------------------------------------------  Time spent: > than  77  Min.   SIGNED: Deatra James, MD, FHM. [facility],  Pager (Please use [email or web address] to page to text)  If 7PM-7AM, please contact night-coverage [email or web address],  11/23/2021, 1:06 PM

## 2021-11-23 NOTE — Progress Notes (Signed)
Pt arrived to room 339 via stretcher from the ED. Received report from [place], Therapist, sports. See assessment. Will continue to monitor.

## 2021-11-23 NOTE — ED Provider Notes (Signed)
Care transferred to me.  Patient's CT reviewed and there appears to be CHF but no obvious PE.  He is otherwise requiring 2 L of oxygen but is in no distress.  Was given Lasix by Dr. Roxanne Mins.  Will need admission and I have discussed with the hospitalist.   Sherwood Gambler, MD 11/23/21 [phone]

## 2021-11-23 NOTE — Assessment & Plan Note (Addendum)
-   Creatinine 1.65 >>> 1.92 -BUN 48, 49, 61 -GFR 41, 40, 34 -Baseline creatinine this year 1.28- 1.53 Lab Results  Component Value Date   CREATININE 1.92 (H) 11/25/2021   CREATININE 1.68 (H) 11/24/2021   CREATININE 1.65 (H) 11/23/2021    -Avoiding nephrotoxins -Unfortunately patient needs IV Lasix -We will continue monitor BUN/creatinine and GFR closely

## 2021-11-23 NOTE — ED Notes (Signed)
Pt passed swallow screen

## 2021-11-23 NOTE — ED Provider Notes (Signed)
[facility] EMERGENCY DEPARTMENT Provider Note   CSN: [id number] Arrival date & time: 11/23/21  5427     History  Chief Complaint  Patient presents with   Shortness of Breath    Bradley Sheppard is a 81 y.o. male.  The history is provided by the patient.  Shortness of Breath He has history of hypertension, hyperlipidemia, chronic kidney disease, MALT lymphoma, coronary artery disease and comes in complaining of exertional dyspnea over the last week which is getting worse.  He states that he gets out of breath just going from one room to the other.  He has noted some soreness in his chest when he breathes out.  He has had a cough which seems to get stuck in his chest.  He denies any chest heaviness, tightness, pressure.  He denies fever or chills.  He denies nausea or vomiting.  He has chronic edema which he thinks is somewhat worse.   Home Medications Prior to Admission medications   Medication Sig Start Date End Date Taking? Authorizing Provider  acetaminophen (TYLENOL) 500 MG tablet Take 500 mg by mouth every 6 (six) hours as needed.    [provider]  amLODipine (NORVASC) 10 MG tablet Take 10 mg by mouth daily. 06/13/21   [provider]  aspirin 81 MG tablet Take 81 mg by mouth daily.    [provider]  carvedilol (COREG) 12.5 MG tablet Take 18.75 mg by mouth 2 (two) times daily. 12/19/20   [provider]  chlorthalidone (HYGROTON) 25 MG tablet Take 25 mg by mouth daily.    [provider]  glipiZIDE (GLUCOTROL XL) 10 MG 24 hr tablet Take 10 mg by mouth 2 (two) times daily. 12/19/20   [provider]  hydrALAZINE (APRESOLINE) 25 MG tablet Take 1 tablet (25 mg total) by mouth 3 (three) times daily. 10/06/21 01/04/22  Lenna Sciara, NP  IRON PO Take 1 tablet by mouth 2 (two) times daily.    [provider]  Lancets (ONETOUCH DELICA PLUS [id number]) [place] Apply topically. 08/07/21   [provider]  LORazepam (ATIVAN)  1 MG tablet Take 1 mg by mouth at bedtime.    [provider]  metFORMIN (GLUCOPHAGE) 500 MG tablet Take 500-1,000 mg by mouth See admin instructions. Take 1000 mg in the morning and 500 mg at night 08/16/21   [provider]  nitroGLYCERIN (NITROSTAT) 0.4 MG SL tablet Place 1 tablet (0.4 mg total) under the tongue every 5 (five) minutes as needed. 06/30/21   Satira Sark, MD  [facility] VERIO test strip 2 (two) times daily. use for testing 08/07/21   [provider]  pioglitazone (ACTOS) 45 MG tablet Take 45 mg by mouth daily.    [provider]  polyethylene glycol-electrolytes (TRILYTE) 420 g solution Take 4,000 mLs by mouth as directed. 09/17/21   Rourk, Cristopher Estimable, MD  rosuvastatin (CRESTOR) 20 MG tablet Take 20 mg by mouth daily.    [provider]  Semaglutide (OZEMPIC, 0.25 OR 0.5 MG/DOSE, [place]) Inject 0.25 mLs into the skin every Wednesday.    [provider]  tiZANidine (ZANAFLEX) 2 MG tablet Take 2 mg by mouth 2 (two) times daily. 01/17/16   [provider]  valsartan (DIOVAN) 320 MG tablet Take 320 mg by mouth daily. 12/19/20   [provider]  vitamin B-12 (CYANOCOBALAMIN) 500 MCG tablet Take 500 mcg by mouth daily.    [provider]  vitamin C (ASCORBIC ACID) 500  MG tablet Take 500 mg by mouth daily.    [provider]      Allergies    Prednisone    Review of Systems   Review of Systems  Respiratory:  Positive for shortness of breath.   All other systems reviewed and are negative.   Physical Exam Updated Vital Signs BP (!) 157/75   Pulse 76   Temp 98.4 F (36.9 C) (Oral)   Resp 18   Ht 6' (1.829 m)   Wt 99.8 kg   SpO2 94%   BMI 29.84 kg/m  Physical Exam Vitals and nursing note reviewed.   81 year old male, resting comfortably and in no acute distress. Vital signs are significant for elevated blood pressure. Oxygen saturation is 94%, which is normal. Head is normocephalic and  atraumatic. PERRLA, EOMI. Oropharynx is clear. Neck is nontender and supple without adenopathy or JVD. Back is nontender and there is no CVA tenderness.  There is trace presacral edema. Lungs have bibasilar rales with a few scattered expiratory wheezes.  There are no rhonchi. Chest is nontender. Heart has regular rate and rhythm without murmur. Abdomen is soft, flat, nontender without masses or hepatosplenomegaly and peristalsis is normoactive. Extremities have 3+ pretibial edema, full range of motion is present. Skin is warm and dry without rash. Neurologic: Mental status is normal, cranial nerves are intact, moves all extremities equally.  ED Results / Procedures / Treatments   Labs (all labs ordered are listed, but only abnormal results are displayed) Labs Reviewed  CBC WITH DIFFERENTIAL/PLATELET - Abnormal; Notable for the following components:      Result Value   RBC 3.11 (*)    Hemoglobin 9.8 (*)    HCT 30.1 (*)    All other components within normal limits  BASIC METABOLIC PANEL - Abnormal; Notable for the following components:   Glucose, Bld 220 (*)    BUN 48 (*)    Creatinine, Ser 1.65 (*)    Calcium 8.6 (*)    GFR, Estimated 41 (*)    Anion gap 4 (*)    All other components within normal limits  BRAIN NATRIURETIC PEPTIDE - Abnormal; Notable for the following components:   B Natriuretic Peptide 308.0 (*)    All other components within normal limits  D-DIMER, QUANTITATIVE - Abnormal; Notable for the following components:   D-Dimer, Quant 2.40 (*)    All other components within normal limits  TROPONIN I (HIGH SENSITIVITY) - Abnormal; Notable for the following components:   Troponin I (High Sensitivity) 82 (*)    All other components within normal limits    EKG EKG Interpretation  Date/Time:  Sunday November 23 2021 06:35:57 EDT Ventricular Rate:  77 PR Interval:  254 QRS Duration: 128 QT Interval:  424 QTC Calculation: 480 R Axis:   -51 Text Interpretation: Sinus  or ectopic atrial rhythm Prolonged PR interval Left bundle branch block When compared with ECG of 11/06/2008, Premature atrial complexes are no longer present Left bundle branch block is now present First degree A-V block is now present Confirmed by Delora Fuel ([id number]) on 11/23/2021 6:40:26 AM  Radiology DG Chest 2 View  Result Date: 11/23/2021 CLINICAL DATA:  Shortness of breath. EXAM: CHEST - 2 VIEW COMPARISON:  11/23/2021 FINDINGS: Stable cardiac enlargement. Aortic atherosclerotic calcifications. Small bilateral pleural effusions noted, right greater than left. Mild diffuse interstitial edema. No airspace consolidation. IMPRESSION: 1. Moderate congestive heart failure. Electronically Signed   By: Kerby Moors M.D.   On:  11/23/2021 07:08    Procedures Procedures  Cardiac monitor shows normal sinus rhythm, per my interpretation.  Medications Ordered in ED Medications - No data to display  ED Course/ Medical Decision Making/ A&P                           Medical Decision Making Amount and/or Complexity of Data Reviewed Labs: ordered. Radiology: ordered.  Risk Prescription drug management.   Shortness of breath with rales and peripheral edema strongly suggestive of heart failure.  With lymphoma history, need to consider possible pulmonary embolism.  Doubt pneumonia.  I have reviewed his old records, and on 07/09/2020 he had an echocardiogram showing left ventricular ejection fraction of 60 to 33% but diastolic parameters were indeterminate.  Regardless of whether this is a heart failure exacerbation, he is clearly fluid overloaded.  I have ordered intravenous furosemide.  I have ordered a work-up including CBC, basic metabolic panel, troponin, BNP, D-dimer, chest x-ray.  I have reviewed and interpreted his ECG, and my interpretation is left bundle branch block which is new compared with 7/6/6 2010.  Chest x-ray shows cardiomegaly and pulmonary vascular congestion consistent with heart  failure.  I have independently viewed the images, and agree with radiologist's interpretation.  I have reviewed and interpreted the laboratory tests and my interpretation is stable anemia, stable renal insufficiency, elevated BNP consistent with heart failure, mildly elevated troponin which is felt to represent demand ischemia and not ACS.  D-dimer is elevated, I have ordered a CT angiogram of the chest to rule out pulmonary embolism.  Case is signed out to Dr. Regenia Skeeter.  Final Clinical Impression(s) / ED Diagnoses Final diagnoses:  Acute on chronic congestive heart failure, unspecified heart failure type ([place])  Elevated troponin  Renal insufficiency  Normochromic normocytic anemia    Rx / DC Orders ED Discharge Orders     None         Delora Fuel, MD 35/45/62 [phone]

## 2021-11-23 NOTE — Assessment & Plan Note (Signed)
-   Iron deficiency, 1 dose IV iron -P.o. iron supplements

## 2021-11-23 NOTE — Hospital Course (Addendum)
Bradley Sheppard is a 81 year old male with extensive history of DM 2, HTN, HLD,MALT lymphoma, CAD presented to ED with chief complaint of shortness of breath -Patient reports shortness of breath has been going on for the past week, progressively getting worse especially with exertion. He denies any chest pain or cough.  Denies of having any recent illnesses States that he has CAD but has not been diagnosed with CHF.  And he is not O2 dependent.    ED course: Blood pressure 133/71, pulse 70, temperature 98.2 F (36.8 C), temperature source Oral, resp. rate 20, height 6' (1.829 m), weight 99.8 kg, SpO2 97 % on 2 L  CBC WBC 9.5, hemoglobin 9.8, CMP within normal limits with exception of BUN 48, creatinine 1.65, calcium 8.6, troponin 82 glucose 220 BNP 308, Total iron 44 D-dimer 2.4,  Chest x-ray: Moderate congestive heart failure CTA: 1. No CT evidence for acute pulmonary embolus. 2. Moderate right and small to moderate left pleural effusions with collapse/consolidation in both lower lobes. 3. Patchy bilateral ground-glass opacities, compatible with edema and/or infection. 4. Aortic Atherosclerosis  Patient underwent Right thoracocentesis  (1.6L) and was started on IV lasix.  His breathing improved and he was weaned to RA.  He subsequently had left thora on 7/27 which removed 900 cc.  His discharge was delayed due development of abd pain.  CT of abd/pelvis was negative for acute findings.  Unfortunately, his discharge was delayed again due to development of fever up to 101.2 on the evening of 7/28.  He was started on Unasyn after discussion with pulmonary for high WBCs in his pleural fluid.  Pleural fluid cultures remained negative.  Blood cultures were obtained.

## 2021-11-23 NOTE — Assessment & Plan Note (Addendum)
-   We will check CBG q. ACH S, with SSI coverage -Home medication of metformin, Ozempic, -Resuming Glucotrol -Anticipating discontinuing his home medication of Actos -Checking A1c 7.6

## 2021-11-23 NOTE — Plan of Care (Signed)
  Problem: Clinical Measurements: Goal: Respiratory complications will improve Outcome: Progressing   Problem: Pain Managment: Goal: General experience of comfort will improve Outcome: Progressing   Problem: Safety: Goal: Ability to remain free from injury will improve Outcome: Progressing   Problem: Skin Integrity: Goal: Risk for impaired skin integrity will decrease Outcome: Progressing   

## 2021-11-23 NOTE — Assessment & Plan Note (Addendum)
-   Stable denies any chest pain -Continue as needed nitroglycerin, continue home medications of statins, beta-blocker and aspirin

## 2021-11-23 NOTE — ED Notes (Signed)
MD, Shahmehdi, notified of lab glucose level 220

## 2021-11-23 NOTE — Assessment & Plan Note (Signed)
-  Being followed closely as an outpatient -Per patient and family planning for resection in August

## 2021-11-23 NOTE — Assessment & Plan Note (Addendum)
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

## 2021-11-23 NOTE — Assessment & Plan Note (Addendum)
-   Currently stable -per Family request Dr. Delton Coombes was consulted -Appreciate his input and close follow-up

## 2021-11-23 NOTE — Assessment & Plan Note (Addendum)
-   Currently satting 100% on 2 L of oxygen - Likely due to new onset CHF and exacerbation, also bilateral pleural effusion  -Elevated proBNP, in the setting of CKD  -CTA chest reviewed: No evidence of PE -Continue diuretics, as needed DuoNeb bronchodilators  -Echocardiogram from March 2022 reviewed, ejection fraction preserved, LVH with dilated right atrial mild aortic stenosis  -Continue supplemental oxygen, with plan to wean off oxygen Not oxygen dependent at baseline  -Continue IV diuretics, as needed DuoNeb bronchodilators -Proceed with thoracentesis -Echocardiogram

## 2021-11-23 NOTE — ED Triage Notes (Signed)
Pt arrived via [facility] from home c/o worsening SOB on exertion. Pt reports discomfort in sternal chest when coughing. EMS report Pt was 93% on Room Air on scene.

## 2021-11-23 NOTE — ED Notes (Signed)
ED Provider at bedside. 

## 2021-11-23 NOTE — Assessment & Plan Note (Signed)
-  Continue statins ?

## 2021-11-24 ENCOUNTER — Inpatient Hospital Stay (HOSPITAL_COMMUNITY): Payer: PPO

## 2021-11-24 ENCOUNTER — Encounter (HOSPITAL_COMMUNITY): Payer: Self-pay | Admitting: Family Medicine

## 2021-11-24 ENCOUNTER — Other Ambulatory Visit (HOSPITAL_COMMUNITY): Payer: Self-pay | Admitting: *Deleted

## 2021-11-24 DIAGNOSIS — N1832 Chronic kidney disease, stage 3b: Secondary | ICD-10-CM

## 2021-11-24 DIAGNOSIS — R0602 Shortness of breath: Secondary | ICD-10-CM | POA: Diagnosis not present

## 2021-11-24 DIAGNOSIS — I339 Acute and subacute endocarditis, unspecified: Secondary | ICD-10-CM | POA: Diagnosis not present

## 2021-11-24 DIAGNOSIS — D649 Anemia, unspecified: Secondary | ICD-10-CM

## 2021-11-24 DIAGNOSIS — C884 Extranodal marginal zone B-cell lymphoma of mucosa-associated lymphoid tissue [MALT-lymphoma]: Secondary | ICD-10-CM

## 2021-11-24 DIAGNOSIS — K6289 Other specified diseases of anus and rectum: Secondary | ICD-10-CM

## 2021-11-24 DIAGNOSIS — J90 Pleural effusion, not elsewhere classified: Secondary | ICD-10-CM | POA: Diagnosis not present

## 2021-11-24 DIAGNOSIS — I25119 Atherosclerotic heart disease of native coronary artery with unspecified angina pectoris: Secondary | ICD-10-CM

## 2021-11-24 LAB — ECHOCARDIOGRAM COMPLETE
AR max vel: 1.33 cm2
AV Area VTI: 1.37 cm2
AV Area mean vel: 1.35 cm2
AV Mean grad: 18 mmHg
AV Peak grad: 33.4 mmHg
Ao pk vel: 2.89 m/s
Area-P 1/2: 3.03 cm2
Height: 72 in
S' Lateral: 2.7 cm
Weight: 3629.65 oz

## 2021-11-24 LAB — CBC
HCT: 27.1 % — ABNORMAL LOW (ref 39.0–52.0)
Hemoglobin: 8.7 g/dL — ABNORMAL LOW (ref 13.0–17.0)
MCH: 31 pg (ref 26.0–34.0)
MCHC: 32.1 g/dL (ref 30.0–36.0)
MCV: 96.4 fL (ref 80.0–100.0)
Platelets: 227 10*3/uL (ref 150–400)
RBC: 2.81 MIL/uL — ABNORMAL LOW (ref 4.22–5.81)
RDW: 13.6 % (ref 11.5–15.5)
WBC: 7 10*3/uL (ref 4.0–10.5)
nRBC: 0 % (ref 0.0–0.2)

## 2021-11-24 LAB — BODY FLUID CELL COUNT WITH DIFFERENTIAL
Eos, Fluid: 0 %
Lymphs, Fluid: 29 %
Monocyte-Macrophage-Serous Fluid: 8 % — ABNORMAL LOW (ref 50–90)
Neutrophil Count, Fluid: 63 % — ABNORMAL HIGH (ref 0–25)
Total Nucleated Cell Count, Fluid: 6234 cu mm — ABNORMAL HIGH (ref 0–1000)

## 2021-11-24 LAB — GRAM STAIN

## 2021-11-24 LAB — LACTATE DEHYDROGENASE, PLEURAL OR PERITONEAL FLUID: LD, Fluid: 171 U/L — ABNORMAL HIGH (ref 3–23)

## 2021-11-24 LAB — PROTEIN, PLEURAL OR PERITONEAL FLUID: Total protein, fluid: 3.6 g/dL

## 2021-11-24 LAB — LACTIC ACID, PLASMA: Lactic Acid, Venous: 0.6 mmol/L (ref 0.5–1.9)

## 2021-11-24 LAB — BRAIN NATRIURETIC PEPTIDE: B Natriuretic Peptide: 293 pg/mL — ABNORMAL HIGH (ref 0.0–100.0)

## 2021-11-24 LAB — TROPONIN I (HIGH SENSITIVITY): Troponin I (High Sensitivity): 95 ng/L — ABNORMAL HIGH (ref <18)

## 2021-11-24 LAB — BASIC METABOLIC PANEL
Anion gap: 6 (ref 5–15)
BUN: 49 mg/dL — ABNORMAL HIGH (ref 8–23)
CO2: 23 mmol/L (ref 22–32)
Calcium: 8.6 mg/dL — ABNORMAL LOW (ref 8.9–10.3)
Chloride: 109 mmol/L (ref 98–111)
Creatinine, Ser: 1.68 mg/dL — ABNORMAL HIGH (ref 0.61–1.24)
GFR, Estimated: 40 mL/min — ABNORMAL LOW (ref >60)
Glucose, Bld: 161 mg/dL — ABNORMAL HIGH (ref 70–99)
Potassium: 3.9 mmol/L (ref 3.5–5.1)
Sodium: 138 mmol/L (ref 135–145)

## 2021-11-24 LAB — APTT: aPTT: 34 seconds (ref 24–36)

## 2021-11-24 LAB — GLUCOSE, CAPILLARY
Glucose-Capillary: 146 mg/dL — ABNORMAL HIGH (ref 70–99)
Glucose-Capillary: 185 mg/dL — ABNORMAL HIGH (ref 70–99)
Glucose-Capillary: 285 mg/dL — ABNORMAL HIGH (ref 70–99)
Glucose-Capillary: 260 mg/dL — ABNORMAL HIGH (ref 70–99)

## 2021-11-24 LAB — PROTIME-INR
INR: 1.1 (ref 0.8–1.2)
Prothrombin Time: 14.2 seconds (ref 11.4–15.2)

## 2021-11-24 LAB — PROCALCITONIN: Procalcitonin: 0.2 ng/mL

## 2021-11-24 MED ORDER — LIDOCAINE HCL (PF) 2 % IJ SOLN
INTRAMUSCULAR | Status: AC
  Filled 2021-11-24: qty 10

## 2021-11-24 NOTE — Progress Notes (Signed)
*  PRELIMINARY RESULTS* Echocardiogram 2D Echocardiogram has been performed.  Samuel Germany 11/24/2021, 11:33 AM

## 2021-11-24 NOTE — Progress Notes (Signed)
   11/24/21 1445  ReDS Vest / Clip  Station Marker D  Ruler Value 37  ReDS Value Range < 36  ReDS Actual Value 35   Pt tolerated well, MD notified.

## 2021-11-24 NOTE — Inpatient Diabetes Management (Signed)
Inpatient Diabetes Program Recommendations  AACE/ADA: New Consensus Statement on Inpatient Glycemic Control   Target Ranges:  Prepandial:   less than 140 mg/dL      Peak postprandial:   less than 180 mg/dL (1-2 hours)      Critically ill patients:  140 - 180 mg/dL    Latest Reference Range & Units 11/24/21 07:30 11/24/21 11:16  Glucose-Capillary 70 - 99 mg/dL 146 (H) 185 (H)   Review of Glycemic Control  Diabetes history: DM2 Outpatient Diabetes medications: Glipizide XL 10 mg BID, Metformin 1000 mg AM, Metformin 500 mg QPM, Actos 45 mg QD Current orders for Inpatient glycemic control: Glipizide XL 10 mg BID, Novolog 0-9 units TID with meals  Inpatient Diabetes Program Recommendations:    Oral DM medications: Would recommend to discontinue Actos outpatient due to side effect of fluid retention and patient dx of CHF.  Thanks, Barnie Alderman, RN, MSN, [place] [facility] Coordinator Inpatient Diabetes Program [phone] (Team Pager from 8am to [place])

## 2021-11-24 NOTE — Progress Notes (Signed)
PT Cancellation Note  Patient Details Name: Bradley Sheppard MRN: [id number] DOB: 12/30/1940   Cancelled Treatment:    Reason Eval/Treat Not Completed: Patient not medically ready; spoke with RN who stated that patient had a busy day with testing/procedures and to hold PT evaluation for today.  2:06 PM, 11/24/21 Mearl Latin PT, DPT Physical Therapist at [facility]

## 2021-11-24 NOTE — Progress Notes (Signed)
PROGRESS NOTE    Patient: Bradley Sheppard                            PCP: [name], MD                    DOB: 1941/02/06            DOA: 11/23/2021 JME:[id number]             DOS: 11/24/2021, 10:19 AM   LOS: 1 day   Date of Service: The patient was seen and examined on 11/24/2021  Subjective:   The patient was seen and examined this morning, stable no acute distress, still on supplemental oxygen 2 L satting 100%.  Complain of shortness of breath with minimal exertion, still complaining of lower extremity edema  Brief Narrative:   Bradley Sheppard is a 81 year old male with extensive history of DM 2, HTN, HLD,MALT lymphoma, CAD presented to ED with chief complaint of shortness of breath -Patient reports shortness of breath has been going on for the past week, progressively getting worse especially with exertion. He denies any chest pain or cough.  Denies of having any recent illnesses States that he has CAD but has not been diagnosed with CHF.  And he is not O2 dependent.    ED course: Blood pressure 133/71, pulse 70, temperature 98.2 F (36.8 C), temperature source Oral, resp. rate 20, height 6' (1.829 m), weight 99.8 kg, SpO2 97 % on 2 L  CBC WBC 9.5, hemoglobin 9.8, CMP within normal limits with exception of BUN 48, creatinine 1.65, calcium 8.6, troponin 82 glucose 220 BNP 308, Total iron 44 D-dimer 2.4,  Chest x-ray: Moderate congestive heart failure CTA: 1. No CT evidence for acute pulmonary embolus. 2. Moderate right and small to moderate left pleural effusions with collapse/consolidation in both lower lobes. 3. Patchy bilateral ground-glass opacities, compatible with edema and/or infection. 4. Aortic Atherosclerosis      Assessment & Plan:   Principal Problem:   SOB (shortness of breath) Active Problems:   CHF (congestive heart failure), NYHA class I, acute, diastolic (HCC)   Bilateral pleural effusion   CKD (chronic kidney disease), stage II   DMII  (diabetes mellitus, type 2) (HCC)   Rectal mass   Mixed hyperlipidemia   Essential hypertension, benign   CORONARY ATHEROSCLEROSIS NATIVE CORONARY ARTERY   Iron deficiency anemia   MALT lymphoma (HCC)     Assessment and Plan: * SOB (shortness of breath) - Currently satting 100% on 2 L of oxygen - Likely due to new onset CHF and exacerbation, also bilateral pleural effusion  -Elevated proBNP, in the setting of CKD  -CTA chest reviewed: No evidence of PE -Continue diuretics, as needed DuoNeb bronchodilators  -Echocardiogram from March 2022 reviewed, ejection fraction preserved, LVH with dilated right atrial mild aortic stenosis  -Continue supplemental oxygen, with plan to wean off oxygen Not oxygen dependent at baseline  -Continue IV diuretics, as needed DuoNeb bronchodilators -Proceed with thoracentesis -Echocardiogram  Bilateral pleural effusion -Planning for ultrasound-guided thoracentesis today 11/24/2021 -With fluid analysis - CT angiogram reviewed moderate right> small left pleural effusion -May be contributing to shortness of breath  CHF (congestive heart failure), NYHA class I, acute, diastolic (HCC) -Possible new onset -appears to be preserved diastolic failure  -Presenting with shortness of breath, lower extremity edema -Elevated proBNP, in the setting of CKD Echo from 07/09/2020 reviewed, LVH, severely dilated right,  ejection fraction 60,  mild AAS  -Due to new onset of symptoms of CHF, repeating 2D - Echocardiogram>> -Consulting cardiology for evaluation -appreciate input -Recycle cardiac enzymes -Possible preserved ejection fraction diastolic failure -We will continue with IV diuretics 40 mg of Lasix twice daily -Daily weight: 102.9 kg -We will monitor I's and O's -We will monitor labs, kidney function - ReDs Clip reading      DMII (diabetes mellitus, type 2) (HCC) - We will check CBG q. ACH S, with SSI coverage -Home medication of metformin,  Ozempic, -Resuming Glucotrol -Anticipating discontinuing his home medication of Actos -Checking A1c 7.6  CKD (chronic kidney disease), stage II - Creatinine 1.65 >>> 1.68 -BUN 48, 49 -GFR 41, 40 -Baseline creatinine this year 1.28- 1.53 Lab Results  Component Value Date   CREATININE 1.68 (H) 11/24/2021   CREATININE 1.65 (H) 11/23/2021   CREATININE 1.28 (H) 11/07/2021    -Avoiding nephrotoxins -Unfortunately patient needs IV Lasix -We will continue monitor BUN/creatinine and GFR closely  Rectal mass -Being followed closely as an outpatient -Per patient and family planning for resection in August  MALT lymphoma (Bradley Sheppard) - Currently stable -Family requested Dr. Raliegh Sheppard to be consulted Consultation has been placed  Iron deficiency anemia - Iron deficiency, 1 dose IV iron -P.o. iron supplements   CORONARY ATHEROSCLEROSIS NATIVE CORONARY ARTERY - Stable denies any chest pain -Continue as needed nitroglycerin, continue home medications of statins, beta-blocker and aspirin  Essential hypertension, benign - Stable resuming home medication Norvasc and Coreg with exception of hydralazine, valsartan...  -will be on IV Lasix, and a on CKD  Mixed hyperlipidemia - Continue statins             ----------------------------------------------------------------------------------------------------------------------------------------------- Nutritional status:  The patient's BMI is: Body mass index is 30.77 kg/m. I agree with the assessment and plan as outlined   DVT prophylaxis:  heparin injection 5,000 Units Start: 11/23/21 2200 TED hose Start: 11/23/21 0916 SCDs Start: 11/23/21 0915   Code Status:   Code Status: Full Code  Family Communication: Daughter, wife present at bedside updated The above findings and plan of care has been discussed with patient (and family)  in detail,  they expressed understanding and agreement of above. -Advance care planning has been  discussed.   Admission status:   Status is: Inpatient Remains inpatient appropriate because: Needing supplemental oxygen, evaluation for hypoxia, edema, likely new onset CHF, will need echocardiogram, thoracentesis--evaluation consulted, cardiology     Procedures:   No admission procedures for hospital encounter.   Antimicrobials:  Anti-infectives (From admission, onward)    None        Medication:   amLODipine  10 mg Oral Daily   aspirin  81 mg Oral Daily   carvedilol  18.75 mg Oral BID WC   docusate sodium  100 mg Oral Daily   ferrous sulfate  325 mg Oral BID WC   furosemide  40 mg Intravenous Q12H   glipiZIDE  10 mg Oral BID WC   heparin  5,000 Units Subcutaneous Q8H   insulin aspart  0-9 Units Subcutaneous TID WC   LORazepam  1 mg Oral QHS   rosuvastatin  20 mg Oral Daily   sodium chloride flush  3 mL Intravenous Q12H   cyanocobalamin  500 mcg Oral Daily    acetaminophen **OR** acetaminophen, bisacodyl, hydrALAZINE, HYDROmorphone (DILAUDID) injection, ipratropium, levalbuterol, nitroGLYCERIN, ondansetron **OR** ondansetron (ZOFRAN) IV, oxyCODONE, senna-docusate, sodium phosphate, traZODone   Objective:   Vitals:   11/23/21 2313 11/23/21 2314 11/24/21  0416 11/24/21 0540  BP: (!) 141/62   (!) 141/59  Pulse: 70 72  70  Resp: 17   19  Temp: 98.8 F (37.1 C)   98.2 F (36.8 C)  TempSrc: Oral   Oral  SpO2: 93% 94% 95% 100%  Weight:   102.9 kg   Height:        Intake/Output Summary (Last 24 hours) at 11/24/2021 1019 Last data filed at 11/24/2021 0400 Gross per 24 hour  Intake 604.91 ml  Output 1600 ml  Net -995.09 ml   Filed Weights   11/23/21 9798 11/24/21 0416  Weight: 99.8 kg 102.9 kg     Examination:   Physical Exam  Constitution:  SOB but Alert, cooperative, no distress,  Appears calm and comfortable  Psychiatric:   Normal and stable mood and affect, cognition intact,   HEENT:        Normocephalic, PERRL, otherwise with in Normal limits   Chest:         Chest symmetric Cardio vascular:  S1/S2, RRR, No murmure, No Rubs or Gallops  pulmonary: Clear to auscultation bilaterally, respirations unlabored, negative wheezes / crackles Abdomen: Soft, non-tender, non-distended, bowel sounds,no masses, no organomegaly Muscular skeletal: Limited exam - in bed, able to move all 4 extremities,   Neuro: CNII-XII intact. , normal motor and sensation, reflexes intact  Extremities: +2  pitting edema lower extremities, +2 pulses  Skin: Dry, warm to touch, negative for any Rashes, No open wounds Wounds: per nursing documentation   ------------------------------------------------------------------------------------------------------------------------------------------    LABs:     Latest Ref Rng & Units 11/24/2021    4:53 AM 11/23/2021    6:40 AM 11/07/2021   11:04 AM  CBC  WBC 4.0 - 10.5 K/uL 7.0  9.5  8.1   Hemoglobin 13.0 - 17.0 g/dL 8.7  9.8  9.5   Hematocrit 39.0 - 52.0 % 27.1  30.1  29.5   Platelets 150 - 400 K/uL 227  257  235       Latest Ref Rng & Units 11/24/2021    4:53 AM 11/23/2021    6:40 AM 11/07/2021   11:04 AM  CMP  Glucose 70 - 99 mg/dL 161  220  158   BUN 8 - 23 mg/dL 49  48  40   Creatinine 0.61 - 1.24 mg/dL 1.68  1.65  1.28   Sodium 135 - 145 mmol/L 138  137  140   Potassium 3.5 - 5.1 mmol/L 3.9  4.5  4.2   Chloride 98 - 111 mmol/L 109  111  112   CO2 22 - 32 mmol/L '23  22  23   '$ Calcium 8.9 - 10.3 mg/dL 8.6  8.6  8.8   Total Protein 6.5 - 8.1 g/dL   7.5   Total Bilirubin 0.3 - 1.2 mg/dL   0.5   Alkaline Phos 38 - 126 U/L   78   AST 15 - 41 U/L   21   ALT 0 - 44 U/L   16        Micro Results No results found for this or any previous visit (from the past 240 hour(s)).  Radiology Reports DG CHEST PORT 1 VIEW  Result Date: 11/24/2021 CLINICAL DATA:  Worsening shortness of breath with exertion. EXAM: PORTABLE CHEST 1 VIEW COMPARISON:  Chest radiograph 1 day prior FINDINGS: The cardiomediastinal silhouette  is stable. Moderate right larger than left pleural effusions are again seen with patchy opacities in the lower lobes, right worse  than left. There is vascular congestion and mild pulmonary interstitial edema. Overall, aeration appears slightly worsened in the right lower lobe compared to the study from 1 day prior. There is no pneumothorax The bones are stable. IMPRESSION: Moderate right larger than left pleural effusions with adjacent opacities in the lower lobes, worsened in the right base compared to the study from 1 day prior. Electronically Signed   By: Valetta Mole M.D.   On: 11/24/2021 08:16    SIGNED: Deatra James, MD, FHM. [facility],  Pager (please use [email or web address] to page/text) Please use Epic Secure Chat for non-urgent communication (7AM-7PM)  If 7PM-7AM, please contact night-coverage [email or web address], 11/24/2021, 10:19 AM

## 2021-11-24 NOTE — Consult Note (Signed)
[facility] Consultation Oncology  Name: Bradley Sheppard      MRN: [id number]    Location: V893/Y101-75  Date: 11/24/2021 Time:7:06 PM   REFERRING PHYSICIAN: Dr. Roger Shelter  REASON FOR CONSULT: MALT lymphoma and rectal mass    HISTORY OF PRESENT ILLNESS: Bradley Sheppard is a 81 year old very pleasant white male who is known to me from office visits.  He was initially seen by [place] for normocytic anemia and a bone marrow biopsy on 09/02/2021 showed non-Hodgkin's lymphoma favoring MALT lymphoma.  Further staging PET scan on 08/28/2021 showed prominent metabolic activity in the left lower rectum and anorectal region with no other adenopathy or bone lesions.  He is being scheduled for surgery for rectal lesion by Dr. Johney Maine.  Patient was admitted to the hospital on 11/23/2021 with difficulty breathing.  Chest x-ray on admission showed moderate congestive heart failure.  CT angiogram showed no evidence of pulmonary embolism.  Moderate right and small to moderate left pleural effusion with collapse/consolidation in both lower lobes.  Patchy bilateral groundglass opacities compatible with edema/infection.  He was started on diuresis.  He had thoracentesis done today with 1.6 L of fluid removed.  His son is at bedside.  Patient reports that he has been feeling slightly better.  He remains on oxygen via nasal cannula.  PAST MEDICAL HISTORY:   Past Medical History:  Diagnosis Date   Anxiety    Arthritis    Asthma    Chronic pain    CKD (chronic kidney disease) stage 3, GFR 30-59 ml/min (HCC)    Coronary atherosclerosis of native coronary artery    Multvessel, DES to LAD and RCA 8/03; EF 65% by echo 11/2014   DM2 (diabetes mellitus, type 2) ([place])    Essential hypertension    Gastritis    Related to NSAIDs   GERD (gastroesophageal reflux disease)    Iron deficiency anemia    Iron deficiency anemia 07/28/2021   Mitral valve regurgitation    Mixed hyperlipidemia    Myocardial infarction ([place])     Nephrolithiasis     ALLERGIES: Allergies  Allergen Reactions   Prednisone Other (See Comments)    "makes him feel faint"      MEDICATIONS: I have reviewed the patient's current medications.     PAST SURGICAL HISTORY Past Surgical History:  Procedure Laterality Date   APPENDECTOMY     BIOPSY  09/25/2021   Procedure: BIOPSY;  Surgeon: Daneil Dolin, MD;  [facility]: [facility];  Service: Endoscopy;;   CARDIAC CATHETERIZATION  01/2002   90% obstruction in proximal LAD, 60 % obstruction in proximal circumflex and 70%  in proximal RCA. LAD & RCA stented  stents x 2   COLONOSCOPY WITH PROPOFOL N/A 09/25/2021   Procedure: COLONOSCOPY WITH PROPOFOL;  Surgeon: Daneil Dolin, MD;  Location: [facility];  Service: Endoscopy;  Laterality: N/A;  11:00am   L knee replacement  2009   Subsequent infectino requiring resectino arthroplasty with incision and drainage 8/10, and reimplantizathion arthroplasty, 9/20. 5 total surgeries.    FAMILY HISTORY: Family History  Problem Relation Age of Onset   Diabetes Mother    Aneurysm Mother        Brain   Stroke Father    Stroke Other    Colon cancer Neg Hx     SOCIAL HISTORY:  reports that he has quit smoking. His smoking use included cigarettes. His smokeless tobacco use includes chew. He reports that he does not drink alcohol and  does not use drugs.  PERFORMANCE STATUS: The patient's performance status is 2 - Symptomatic, <50% confined to bed  PHYSICAL EXAM: Most Recent Vital Signs: Blood pressure (!) 130/55, pulse 66, temperature 98.4 F (36.9 C), resp. rate 18, height 6' (1.829 m), weight 226 lb 13.7 oz (102.9 kg), SpO2 98 %. BP (!) 130/55 (BP Location: Left Arm)   Pulse 66   Temp 98.4 F (36.9 C)   Resp 18   Ht 6' (1.829 m)   Wt 226 lb 13.7 oz (102.9 kg)   SpO2 98%   BMI 30.77 kg/m  General appearance: alert, cooperative, and appears stated age Extremities:  2+ edema bilaterally.  LABORATORY DATA:  Results for orders  placed or performed during the hospital encounter of 11/23/21 (from the past 48 hour(s))  CBC with Differential     Status: Abnormal   Collection Time: 11/23/21  6:40 AM  Result Value Ref Range   WBC 9.5 4.0 - 10.5 K/uL   RBC 3.11 (L) 4.22 - 5.81 MIL/uL   Hemoglobin 9.8 (L) 13.0 - 17.0 g/dL   HCT 30.1 (L) 39.0 - 52.0 %   MCV 96.8 80.0 - 100.0 fL   MCH 31.5 26.0 - 34.0 pg   MCHC 32.6 30.0 - 36.0 g/dL   RDW 13.8 11.5 - 15.5 %   Platelets 257 150 - 400 K/uL   nRBC 0.0 0.0 - 0.2 %   Neutrophils Relative % 69 %   Neutro Abs 6.6 1.7 - 7.7 K/uL   Lymphocytes Relative 19 %   Lymphs Abs 1.8 0.7 - 4.0 K/uL   Monocytes Relative 10 %   Monocytes Absolute 0.9 0.1 - 1.0 K/uL   Eosinophils Relative 1 %   Eosinophils Absolute 0.1 0.0 - 0.5 K/uL   Basophils Relative 0 %   Basophils Absolute 0.0 0.0 - 0.1 K/uL   Immature Granulocytes 1 %   Abs Immature Granulocytes 0.05 0.00 - 0.07 K/uL    Comment: Performed at [facility], [address], [place], [place] [address]  Basic metabolic panel     Status: Abnormal   Collection Time: 11/23/21  6:40 AM  Result Value Ref Range   Sodium 137 135 - 145 mmol/L   Potassium 4.5 3.5 - 5.1 mmol/L   Chloride 111 98 - 111 mmol/L   CO2 22 22 - 32 mmol/L   Glucose, Bld 220 (H) 70 - 99 mg/dL    Comment: Glucose reference range applies only to samples taken after fasting for at least 8 hours.   BUN 48 (H) 8 - 23 mg/dL   Creatinine, Ser 1.65 (H) 0.61 - 1.24 mg/dL   Calcium 8.6 (L) 8.9 - 10.3 mg/dL   GFR, Estimated 41 (L) >60 mL/min    Comment: (NOTE) Calculated using the CKD-EPI Creatinine Equation (2021)    Anion gap 4 (L) 5 - 15    Comment: Performed at [facility], [address], [place], [place] [address]  Troponin I (High Sensitivity)     Status: Abnormal   Collection Time: 11/23/21  6:40 AM  Result Value Ref Range   Troponin I (High Sensitivity) 82 (H) <18 ng/L    Comment: (NOTE) Elevated high sensitivity troponin I (hsTnI) values and significant   changes across serial measurements may suggest ACS but many other  chronic and acute conditions are known to elevate hsTnI results.  Refer to the "Links" section for chest pain algorithms and additional  guidance. Performed at [facility], [address], [place],  [place] [address]   Brain natriuretic peptide     Status: Abnormal   Collection Time: 11/23/21  6:40 AM  Result Value Ref Range   B Natriuretic Peptide 308.0 (H) 0.0 - 100.0 pg/mL    Comment: Performed at [facility], [address], [place], [place] [address]  D-dimer, quantitative     Status: Abnormal   Collection Time: 11/23/21  6:40 AM  Result Value Ref Range   D-Dimer, Quant 2.40 (H) 0.00 - 0.50 ug/mL-FEU    Comment: (NOTE) At the manufacturer cut-off value of 0.5 g/mL FEU, this assay has a negative predictive value of 95-100%.This assay is intended for use in conjunction with a clinical pretest probability (PTP) assessment model to exclude pulmonary embolism (PE) and deep venous thrombosis (DVT) in outpatients suspected of PE or DVT. Results should be correlated with clinical presentation. Performed at [facility], [address], [place], [place] [address]   Magnesium     Status: None   Collection Time: 11/23/21  6:40 AM  Result Value Ref Range   Magnesium 2.1 1.7 - 2.4 mg/dL    Comment: Performed at [facility], [address] [place], [place] [address]  Phosphorus     Status: None   Collection Time: 11/23/21  6:40 AM  Result Value Ref Range   Phosphorus 3.9 2.5 - 4.6 mg/dL    Comment: Performed at [facility], [address] [place], [place] [address]  Protime-INR     Status: None   Collection Time: 11/23/21  6:40 AM  Result Value Ref Range   Prothrombin Time 13.6 11.4 - 15.2 seconds   INR 1.1 0.8 - 1.2    Comment: (NOTE) INR goal varies based on device and disease states. Performed at [facility], [address], [place], [place] [address]   Hemoglobin A1c     Status: Abnormal   Collection Time:  11/23/21  6:40 AM  Result Value Ref Range   Hgb A1c MFr Bld 7.6 (H) 4.8 - 5.6 %    Comment: (NOTE) Pre diabetes:          5.7%-6.4%  Diabetes:              >6.4%  Glycemic control for   <7.0% adults with diabetes    Mean Plasma Glucose 171.42 mg/dL    Comment: Performed at [facility] [address] [place], [place] [address]  Glucose, capillary     Status: Abnormal   Collection Time: 11/23/21 11:56 AM  Result Value Ref Range   Glucose-Capillary 197 (H) 70 - 99 mg/dL    Comment: Glucose reference range applies only to samples taken after fasting for at least 8 hours.  Glucose, capillary     Status: Abnormal   Collection Time: 11/23/21  3:57 PM  Result Value Ref Range   Glucose-Capillary 227 (H) 70 - 99 mg/dL    Comment: Glucose reference range applies only to samples taken after fasting for at least 8 hours.  Glucose, capillary     Status: Abnormal   Collection Time: 11/23/21  9:47 PM  Result Value Ref Range   Glucose-Capillary 241 (H) 70 - 99 mg/dL    Comment: Glucose reference range applies only to samples taken after fasting for at least 8 hours.  Basic metabolic panel     Status: Abnormal   Collection Time: 11/24/21  4:53 AM  Result Value Ref Range   Sodium 138 135 - 145 mmol/L   Potassium 3.9 3.5 - 5.1 mmol/L  Chloride 109 98 - 111 mmol/L   CO2 23 22 - 32 mmol/L   Glucose, Bld 161 (H) 70 - 99 mg/dL    Comment: Glucose reference range applies only to samples taken after fasting for at least 8 hours.   BUN 49 (H) 8 - 23 mg/dL   Creatinine, Ser 1.68 (H) 0.61 - 1.24 mg/dL   Calcium 8.6 (L) 8.9 - 10.3 mg/dL   GFR, Estimated 40 (L) >60 mL/min    Comment: (NOTE) Calculated using the CKD-EPI Creatinine Equation (2021)    Anion gap 6 5 - 15    Comment: Performed at [facility], [address], [place], [place] [address]  CBC     Status: Abnormal   Collection Time: 11/24/21  4:53 AM  Result Value Ref Range   WBC 7.0 4.0 - 10.5 K/uL   RBC 2.81 (L) 4.22 - 5.81  MIL/uL   Hemoglobin 8.7 (L) 13.0 - 17.0 g/dL   HCT 27.1 (L) 39.0 - 52.0 %   MCV 96.4 80.0 - 100.0 fL   MCH 31.0 26.0 - 34.0 pg   MCHC 32.1 30.0 - 36.0 g/dL   RDW 13.6 11.5 - 15.5 %   Platelets 227 150 - 400 K/uL   nRBC 0.0 0.0 - 0.2 %    Comment: Performed at [facility], [address], [place], [place] [address]  Protime-INR     Status: None   Collection Time: 11/24/21  4:53 AM  Result Value Ref Range   Prothrombin Time 14.2 11.4 - 15.2 seconds   INR 1.1 0.8 - 1.2    Comment: (NOTE) INR goal varies based on device and disease states. Performed at [facility], [address], [place], [place] [address]   APTT     Status: None   Collection Time: 11/24/21  4:53 AM  Result Value Ref Range   aPTT 34 24 - 36 seconds    Comment: Performed at [facility], [address] [place],  [address]  Procalcitonin - Baseline     Status: None   Collection Time: 11/24/21  4:53 AM  Result Value Ref Range   Procalcitonin 0.20 ng/mL    Comment:        Interpretation: PCT (Procalcitonin) <= 0.5 ng/mL: Systemic infection (sepsis) is not likely. Local bacterial infection is possible. (NOTE)       Sepsis PCT Algorithm           Lower Respiratory Tract                                      Infection PCT Algorithm    ----------------------------     ----------------------------         PCT < 0.25 ng/mL                PCT < 0.10 ng/mL          Strongly encourage             Strongly discourage   discontinuation of antibiotics    initiation of antibiotics    ----------------------------     -----------------------------       PCT 0.25 - 0.50 ng/mL            PCT 0.10 - 0.25 ng/mL               OR       >80% decrease in PCT  Discourage initiation of                                            antibiotics      Encourage discontinuation           of antibiotics    ----------------------------     -----------------------------         PCT >= 0.50 ng/mL              PCT 0.26 - 0.50  ng/mL               AND        <80% decrease in PCT             Encourage initiation of                                             antibiotics       Encourage continuation           of antibiotics    ----------------------------     -----------------------------        PCT >= 0.50 ng/mL                  PCT > 0.50 ng/mL               AND         increase in PCT                  Strongly encourage                                      initiation of antibiotics    Strongly encourage escalation           of antibiotics                                     -----------------------------                                           PCT <= 0.25 ng/mL                                                 OR                                        > 80% decrease in PCT                                      Discontinue / Do not initiate  antibiotics  Performed at [facility], [address], [place], [place] [address]   Brain natriuretic peptide     Status: Abnormal   Collection Time: 11/24/21  4:54 AM  Result Value Ref Range   B Natriuretic Peptide 293.0 (H) 0.0 - 100.0 pg/mL    Comment: Performed at [facility], [address] [place], [place] [address]  Glucose, capillary     Status: Abnormal   Collection Time: 11/24/21  7:30 AM  Result Value Ref Range   Glucose-Capillary 146 (H) 70 - 99 mg/dL    Comment: Glucose reference range applies only to samples taken after fasting for at least 8 hours.  Lactic acid, plasma     Status: None   Collection Time: 11/24/21  7:41 AM  Result Value Ref Range   Lactic Acid, Venous 0.6 0.5 - 1.9 mmol/L    Comment: Performed at [facility], [address], [place], [place] [address]  Glucose, capillary     Status: Abnormal   Collection Time: 11/24/21 11:16 AM  Result Value Ref Range   Glucose-Capillary 185 (H) 70 - 99 mg/dL    Comment: Glucose reference range applies only to samples taken after fasting for at least 8  hours.  Troponin I (High Sensitivity)     Status: Abnormal   Collection Time: 11/24/21 11:35 AM  Result Value Ref Range   Troponin I (High Sensitivity) 95 (H) <18 ng/L    Comment: (NOTE) Elevated high sensitivity troponin I (hsTnI) values and significant  changes across serial measurements may suggest ACS but many other  chronic and acute conditions are known to elevate hsTnI results.  Refer to the "Links" section for chest pain algorithms and additional  guidance. Performed at [facility], [address] [place], [place] [address]   Body fluid cell count with differential     Status: Abnormal   Collection Time: 11/24/21 12:40 PM  Result Value Ref Range   Fluid Type-FCT PLEURAL    Color, Fluid YELLOW (A) YELLOW   Appearance, Fluid HAZY (A) CLEAR   Total Nucleated Cell Count, Fluid 6,234 (H) 0 - 1,000 cu mm   Neutrophil Count, Fluid 63 (H) 0 - 25 %   Lymphs, Fluid 29 %   Monocyte-Macrophage-Serous Fluid 8 (L) 50 - 90 %   Eos, Fluid 0 %   Other Cells, Fluid OTHER CELLS IDENTIFIED AS MESOTHELIAL CELLS %    Comment: Performed at [facility], [address], [place], [place] [address]  Gram stain     Status: None   Collection Time: 11/24/21 12:40 PM   Specimen: Pleura  Result Value Ref Range   Specimen Description PLEURAL    Special Requests PLEURAL    Gram Stain      WBC PRESENT,BOTH PMN AND MONONUCLEAR NO ORGANISMS SEEN CYTOSPIN SMEAR Performed at [facility], [address], [place], [place] [address]    Report Status 11/24/2021 FINAL   Culture, body fluid w Gram Stain-bottle     Status: None (Preliminary result)   Collection Time: 11/24/21 12:40 PM   Specimen: Pleura  Result Value Ref Range   Specimen Description PLEURAL    Special Requests      BOTTLES DRAWN AEROBIC AND ANAEROBIC Blood Culture results may not be optimal due to an excessive volume of blood received in culture bottles Performed at [facility], [address] [place], [place] [address]    Culture  PENDING    Report Status PENDING   Glucose, capillary  Status: Abnormal   Collection Time: 11/24/21  4:30 PM  Result Value Ref Range   Glucose-Capillary 285 (H) 70 - 99 mg/dL    Comment: Glucose reference range applies only to samples taken after fasting for at least 8 hours.      RADIOGRAPHY: DG Chest 1 View  Result Date: 11/24/2021 CLINICAL DATA:  RIGHT pleural effusion post thoracentesis EXAM: CHEST  1 VIEW COMPARISON:  11/24/2021 FINDINGS: Enlargement of cardiac silhouette with pulmonary vascular congestion. Markedly decreased RIGHT pleural effusion and basilar atelectasis. Residual small LEFT pleural effusion and basilar atelectasis. Interstitial infiltrate consistent with pulmonary edema/CHF. Atherosclerotic calcification aorta. No pneumothorax following thoracentesis. Osseous demineralization. IMPRESSION: Mild CHF with decreased RIGHT pleural effusion and basilar atelectasis post thoracentesis. No pneumothorax following RIGHT thoracentesis. Electronically Signed   By: Lavonia Dana M.D.   On: 11/24/2021 13:20   US THORACENTESIS ASP PLEURAL SPACE W/IMG GUIDE  Result Date: 11/24/2021 Lavonia Dana, MD     11/24/2021  1:19 PM PreOperative Dx: RIGHT pleural effusion Postoperative Dx: RIGHT pleural effusion Procedure:   US guided RIGHT thoracentesis Radiologist:  Thornton Papas Anesthesia:  10 ml of 1% lidocaine Specimen:  1.6 L of sl cloudy yellow colored fluid EBL:   < 1 ml Complications: None    ECHOCARDIOGRAM COMPLETE  Result Date: 11/24/2021    ECHOCARDIOGRAM REPORT   Patient Name:   Bradley Sheppard Date of Exam: 11/24/2021 Medical Rec #:  [id number]        Height:       72.0 in Accession #:    [id number]       Weight:       226.9 lb Date of Birth:  29-Apr-1941        BSA:          2.248 m Patient Age:    65 years         BP:           141/59 mmHg Patient Gender: M                HR:           62 bpm. Exam Location:  [facility] Procedure: 2D Echo, Cardiac Doppler and Color Doppler Indications:    I33.9  SBE  History:        Patient has prior history of Echocardiogram examinations, most                 recent 07/09/2020. CHF, CAD and Previous Myocardial Infarction,                 Aortic Valve Disease, Signs/Symptoms:Murmur; Risk                 Factors:Hypertension, Dyslipidemia, Former Smoker and Diabetes.                 Hx of CKD.  Sonographer:    Alvino Chapel RCS Referring Phys: [place]  1. Left ventricular ejection fraction, by estimation, is 65 to 70%. The left ventricle has normal function. The left ventricle has no regional wall motion abnormalities. There is moderate concentric left ventricular hypertrophy. Left ventricular diastolic parameters are consistent with Grade II diastolic dysfunction (pseudonormalization). Elevated left ventricular end-diastolic pressure.  2. Right ventricular systolic function is normal. The right ventricular size is normal. Tricuspid regurgitation signal is inadequate for assessing PA pressure.  3. Left atrial size was severely dilated.  4. Right atrial size was moderately dilated.  5. A small pericardial effusion  is present. The pericardial effusion is posterior to the left ventricle and anterior to the right ventricle. There is no evidence of cardiac tamponade.  6. The mitral valve is degenerative. Trivial mitral valve regurgitation.  7. The aortic valve is tricuspid with restricted motion of the left coronary cusp. There is moderate calcification of the aortic valve. Moderate aortic valve stenosis. Aortic valve mean gradient measures 18.0 mmHg. Dimentionless index 0.44.  8. The inferior vena cava is dilated in size with >50% respiratory variability, suggesting right atrial pressure of 8 mmHg. Comparison(s): Prior images reviewed side by side. LVEF vigorous with moderate diastolic dysfunction and increased LV filling pressure. Moderate aortic stenosis, mean gradient has increased from 13 mmHg to 18 mmHg. FINDINGS  Left Ventricle: Left ventricular  ejection fraction, by estimation, is 65 to 70%. The left ventricle has normal function. The left ventricle has no regional wall motion abnormalities. The left ventricular internal cavity size was normal in size. There is  moderate concentric left ventricular hypertrophy. Left ventricular diastolic parameters are consistent with Grade II diastolic dysfunction (pseudonormalization). Elevated left ventricular end-diastolic pressure. Right Ventricle: The right ventricular size is normal. No increase in right ventricular wall thickness. Right ventricular systolic function is normal. Tricuspid regurgitation signal is inadequate for assessing PA pressure. Left Atrium: Left atrial size was severely dilated. Right Atrium: Right atrial size was moderately dilated. Pericardium: A small pericardial effusion is present. The pericardial effusion is posterior to the left ventricle and anterior to the right ventricle. There is no evidence of cardiac tamponade. Mitral Valve: The mitral valve is degenerative in appearance. There is mild thickening of the mitral valve leaflet(s). There is mild calcification of the mitral valve leaflet(s). Trivial mitral valve regurgitation. Tricuspid Valve: The tricuspid valve is grossly normal. Tricuspid valve regurgitation is trivial. Aortic Valve: The aortic valve is tricuspid. There is moderate calcification of the aortic valve. There is mild to moderate aortic valve annular calcification. Aortic valve regurgitation is not visualized. Moderate aortic stenosis is present. Aortic valve mean gradient measures 18.0 mmHg. Aortic valve peak gradient measures 33.4 mmHg. Aortic valve area, by VTI measures 1.37 cm. Pulmonic Valve: The pulmonic valve was grossly normal. Pulmonic valve regurgitation is trivial. Aorta: The aortic root is normal in size and structure. Venous: The inferior vena cava is dilated in size with greater than 50% respiratory variability, suggesting right atrial pressure of 8 mmHg.  IAS/Shunts: No atrial level shunt detected by color flow Doppler.  LEFT VENTRICLE PLAX 2D LVIDd:         4.40 cm   Diastology LVIDs:         2.70 cm   LV e' medial:    7.29 cm/s LV PW:         1.50 cm   LV E/e' medial:  18.9 LV IVS:        1.70 cm   LV e' lateral:   6.74 cm/s LVOT diam:     2.00 cm   LV E/e' lateral: 20.5 LV SV:         92 LV SV Index:   41 LVOT Area:     3.14 cm  RIGHT VENTRICLE RV S prime:     15.80 cm/s TAPSE (M-mode): 2.8 cm LEFT ATRIUM              Index        RIGHT ATRIUM           Index LA diam:  4.30 cm  1.91 cm/m   RA Area:     26.20 cm LA Vol (A2C):   99.3 ml  44.18 ml/m  RA Volume:   90.00 ml  40.04 ml/m LA Vol (A4C):   138.0 ml 61.39 ml/m LA Biplane Vol: 119.0 ml 52.94 ml/m  AORTIC VALVE AV Area (Vmax):    1.33 cm AV Area (Vmean):   1.35 cm AV Area (VTI):     1.37 cm AV Vmax:           289.00 cm/s AV Vmean:          199.000 cm/s AV VTI:            0.669 m AV Peak Grad:      33.4 mmHg AV Mean Grad:      18.0 mmHg LVOT Vmax:         122.33 cm/s LVOT Vmean:        85.400 cm/s LVOT VTI:          0.292 m LVOT/AV VTI ratio: 0.44  AORTA Ao Root diam: 3.70 cm MITRAL VALVE MV Area (PHT): 3.03 cm     SHUNTS MV Decel Time: 250 msec     Systemic VTI:  0.29 m MV E velocity: 138.00 cm/s  Systemic Diam: 2.00 cm MV A velocity: 77.10 cm/s MV E/A ratio:  1.79 Rozann Lesches MD Electronically signed by Rozann Lesches MD Signature Date/Time: 11/24/2021/11:51:56 AM    Final    DG CHEST PORT 1 VIEW  Result Date: 11/24/2021 CLINICAL DATA:  Worsening shortness of breath with exertion. EXAM: PORTABLE CHEST 1 VIEW COMPARISON:  Chest radiograph 1 day prior FINDINGS: The cardiomediastinal silhouette is stable. Moderate right larger than left pleural effusions are again seen with patchy opacities in the lower lobes, right worse than left. There is vascular congestion and mild pulmonary interstitial edema. Overall, aeration appears slightly worsened in the right lower lobe compared to the study  from 1 day prior. There is no pneumothorax The bones are stable. IMPRESSION: Moderate right larger than left pleural effusions with adjacent opacities in the lower lobes, worsened in the right base compared to the study from 1 day prior. Electronically Signed   By: Valetta Mole M.D.   On: 11/24/2021 08:16   CT Angio Chest PE W and/or Wo Contrast  Result Date: 11/23/2021 CLINICAL DATA:  History of MALT lymphoma. Exertional dyspnea. Clinical concern for pulmonary embolus. EXAM: CT ANGIOGRAPHY CHEST WITH CONTRAST TECHNIQUE: Multidetector CT imaging of the chest was performed using the standard protocol during bolus administration of intravenous contrast. Multiplanar CT image reconstructions and MIPs were obtained to evaluate the vascular anatomy. RADIATION DOSE REDUCTION: This exam was performed according to the departmental dose-optimization program which includes automated exposure control, adjustment of the mA and/or kV according to patient size and/or use of iterative reconstruction technique. CONTRAST:  6m OMNIPAQUE IOHEXOL 350 MG/ML SOLN COMPARISON:  PET-CT 08/28/2021 FINDINGS: Cardiovascular: Heart is enlarged. Coronary artery calcification is evident. Mild atherosclerotic calcification is noted in the wall of the thoracic aorta. There is no filling defect within the opacified pulmonary arteries to suggest the presence of an acute pulmonary embolus. Mediastinum/Nodes: No mediastinal lymphadenopathy. There is no hilar lymphadenopathy. The esophagus has normal imaging features. There is no axillary lymphadenopathy. Lungs/Pleura: Fine architectural lung detail obscured by substantial breathing motion during image acquisition. Probable component of underlying emphysema. There is collapse/consolidation in both lower lobes with superimposed patchy bilateral ground-glass opacities. Moderate right and small to moderate left pleural  effusions evident. Upper Abdomen: Unremarkable. Musculoskeletal: No worrisome lytic  or sclerotic osseous abnormality. Review of the MIP images confirms the above findings. IMPRESSION: 1. No CT evidence for acute pulmonary embolus. 2. Moderate right and small to moderate left pleural effusions with collapse/consolidation in both lower lobes. 3. Patchy bilateral ground-glass opacities, compatible with edema and/or infection. 4. Aortic Atherosclerosis (ICD10-I70.0). Electronically Signed   By: Misty Stanley M.D.   On: 11/23/2021 08:59   DG Chest 2 View  Result Date: 11/23/2021 CLINICAL DATA:  Shortness of breath. EXAM: CHEST - 2 VIEW COMPARISON:  11/23/2021 FINDINGS: Stable cardiac enlargement. Aortic atherosclerotic calcifications. Small bilateral pleural effusions noted, right greater than left. Mild diffuse interstitial edema. No airspace consolidation. IMPRESSION: 1. Moderate congestive heart failure. Electronically Signed   By: Kerby Moors M.D.   On: 11/23/2021 07:08        ASSESSMENT and PLAN:  1.  Bilateral pleural effusions: - He did not have any effusions on PET scan from 08/28/2021. - CT angiogram revealed moderate right and small left pleural effusion. - Right thoracentesis with 1.6 L of cloudy yellow-colored fluid removed.  Further testing is pending.  2.  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  3.  Rectal mass: - Colonoscopy by Dr. 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  2 mildly enlarged left external iliac lymph nodes noted. - He is scheduled for transanal microsurgery by Dr. Johney Maine end of August.  4.  Normocytic anemia: - Anemia from CKD and relative iron deficiency. - Last Feraheme on 08/11/2021.  Latest hemoglobin is 8.7. - We will do anemia work-up with labs tomorrow.  All questions were answered. The patient knows to call the clinic with any problems, questions or concerns. We can  certainly see the patient much sooner if necessary.    Derek Jack

## 2021-11-24 NOTE — Procedures (Signed)
PreOperative Dx: RIGHT pleural effusion Postoperative Dx: RIGHT pleural effusion Procedure:   US guided RIGHT thoracentesis Radiologist:  Thornton Papas Anesthesia:  10 ml of 1% lidocaine Specimen:  1.6 L of sl cloudy yellow colored fluid EBL:   < 1 ml Complications: None

## 2021-11-24 NOTE — Consult Note (Addendum)
Cardiology Consultation:   Patient ID: Bradley Sheppard MRN: [id number]; DOB: 03-27-1941  Admit date: 11/23/2021 Date of Consult: 11/24/2021  PCP:  [name], MD   [facility] Providers Cardiologist:  Rozann Lesches, MD        Patient Profile:   Bradley Sheppard is a 81 y.o. male with a hx of CAD, mild AS, bilateral carotid artery stenosis, HTN, HLD, CKD stage 3b, DM-2, GERD, arthritis, iron def anemia, non hodgkin's lymphoma and rectal tumor needing surgery who is being seen 11/24/2021 for the evaluation of shortness of breath at the request of Dr. Roger Shelter.  History of Present Illness:   Bradley Sheppard with above hx and CAD s/p DES-LAD and RCA in 2003.  Lexiscan Myoview in 2014 consistent with history of CAD, revealed scar within the inferolateral wall with mild to moderate peri-infarct ischemia, EF 57%.  Most recent echocardiogram in March 2022 showed EF 60 to 65%, normal RV function, no RWMA, mild LVH, indeterminate LV diastolic parameters, mild aortic valve stenosis, mean gradient 13.4 mmHg.  Carotid Dopplers in September 2022 showed 1 to 39% B ICA stenosis.   He was last seen in the office 10/06/21 for surgical clearance for rectal tumor.  On that visit he discussed weakness and difficulty ambulating with Rt hip and knee pain.  With walking he felt like he could give out.  He was cleared for surgery as intermediate risk though this would not preclude surgery.     On that visit was noted to be on ramipril and valsartan so the ramipril was stopped and hydralazine added.    Pt presented to ER 11/23/21 with increased DOE and cough with chest pain with coughing.     BNP 308, ddimer 2.4 Iron 44  CTA of chest neg for PE.  Mod Rt and small to mod Lt pl effusions Patchy bilateral ground-glass opacities, compatible with edema and/or infection.  PCXR today with  IMPRESSION: Moderate right larger than left pleural effusions with adjacent opacities in the lower lobes, worsened in the right  base compared to the study from 1 day prior.  EKG:  The EKG was personally reviewed and demonstrates:  SR with LBBB which was present 10/2021 and with incomplete LBBB on piror EKGs and 1st degree AV block with PR 254 ms Telemetry:  Telemetry was personally reviewed and demonstrates:  SR with rare PVCs and couplets.  Na 138 K+ 3.9 Cr 1.68 GFR 40   LFTs WNL though albumin 3.3  BNP 293  Hgb 8.7 WBC 7 plts 227  HS troponin 82 A1c 7.6  Lactic acid 0.6  BP 141/59 P 70 T 98.2 R 19 No chest pain except with cough but his family notes some chest discomfort shoulder to shoulder across chest.   He was so SOB he could not do much activity NYHA 3-4.  Today is better though still a lot of edema.  Past Medical History:  Diagnosis Date   Anxiety    Arthritis    Asthma    Chronic pain    CKD (chronic kidney disease) stage 3, GFR 30-59 ml/min (HCC)    Coronary atherosclerosis of native coronary artery    Multvessel, DES to LAD and RCA 8/03; EF 65% by echo 11/2014   DM2 (diabetes mellitus, type 2) ([place])    Essential hypertension    Gastritis    Related to NSAIDs   GERD (gastroesophageal reflux disease)    Iron deficiency anemia    Iron deficiency anemia 07/28/2021  Mitral valve regurgitation    Mixed hyperlipidemia    Myocardial infarction [facility])    Nephrolithiasis     Past Surgical History:  Procedure Laterality Date   APPENDECTOMY     BIOPSY  09/25/2021   Procedure: BIOPSY;  Surgeon: Daneil Dolin, MD;  Location: [facility];  Service: Endoscopy;;   CARDIAC CATHETERIZATION  01/2002   90% obstruction in proximal LAD, 60 % obstruction in proximal circumflex and 70%  in proximal RCA. LAD & RCA stented  stents x 2   COLONOSCOPY WITH PROPOFOL N/A 09/25/2021   Procedure: COLONOSCOPY WITH PROPOFOL;  Surgeon: Daneil Dolin, MD;  Location: [facility];  Service: Endoscopy;  Laterality: N/A;  11:00am   L knee replacement  2009   Subsequent infectino requiring resectino arthroplasty with  incision and drainage 8/10, and reimplantizathion arthroplasty, 9/20. 5 total surgeries.     Home Medications:  Prior to Admission medications   Medication Sig Start Date End Date Taking? Authorizing Provider  acetaminophen (TYLENOL) 500 MG tablet Take 500 mg by mouth every 6 (six) hours as needed.   Yes [provider]  amLODipine (NORVASC) 10 MG tablet Take 10 mg by mouth daily. 06/13/21  Yes [provider]  aspirin 81 MG tablet Take 81 mg by mouth daily.   Yes [provider]  carvedilol (COREG) 12.5 MG tablet Take 18.75 mg by mouth 2 (two) times daily. 12/19/20  Yes [provider]  chlorthalidone (HYGROTON) 25 MG tablet Take 25 mg by mouth daily.   Yes [provider]  glipiZIDE (GLUCOTROL XL) 10 MG 24 hr tablet Take 10 mg by mouth 2 (two) times daily. 12/19/20  Yes [provider]  hydrALAZINE (APRESOLINE) 25 MG tablet Take 1 tablet (25 mg total) by mouth 3 (three) times daily. 10/06/21 01/04/22 Yes Monge, Helane Gunther, NP  IRON PO Take 1 tablet by mouth 2 (two) times daily.   Yes [provider]  LORazepam (ATIVAN) 1 MG tablet Take 1 mg by mouth at bedtime.   Yes [provider]  metFORMIN (GLUCOPHAGE) 500 MG tablet Take 500-1,000 mg by mouth See admin instructions. Take 1000 mg in the morning and 500 mg at night 08/16/21  Yes [provider]  nitroGLYCERIN (NITROSTAT) 0.4 MG SL tablet Place 1 tablet (0.4 mg total) under the tongue every 5 (five) minutes as needed. 06/30/21  Yes Satira Sark, MD  pioglitazone (ACTOS) 45 MG tablet Take 45 mg by mouth daily.   Yes [provider]  ramipril (ALTACE) 10 MG capsule Take 10 mg by mouth 2 (two) times daily.   Yes [provider]  rosuvastatin (CRESTOR) 20 MG tablet Take 20 mg by mouth daily.   Yes [provider]  tiZANidine (ZANAFLEX) 2 MG tablet Take 2 mg by mouth 2 (two) times daily.   Yes [provider]  valsartan (DIOVAN) 320 MG  tablet Take 320 mg by mouth daily. 12/19/20  Yes [provider]  vitamin B-12 (CYANOCOBALAMIN) 500 MCG tablet Take 500 mcg by mouth daily.   Yes [provider]  vitamin C (ASCORBIC ACID) 500 MG tablet Take 500 mg by mouth daily.   Yes [provider]  polyethylene glycol-electrolytes (TRILYTE) 420 g solution Take 4,000 mLs by mouth as directed. Patient not taking: Reported on 11/23/2021 09/17/21   Daneil Dolin, MD  Semaglutide (OZEMPIC, 0.25 OR 0.5 MG/DOSE, [place]) Inject 0.25 mLs into the skin every Wednesday. Patient not taking: Reported on 11/23/2021  [provider]    Inpatient Medications: Scheduled Meds:  amLODipine  10 mg Oral Daily   aspirin  81 mg Oral Daily   carvedilol  18.75 mg Oral BID WC   docusate sodium  100 mg Oral Daily   ferrous sulfate  325 mg Oral BID WC   furosemide  40 mg Intravenous Q12H   glipiZIDE  10 mg Oral BID WC   heparin  5,000 Units Subcutaneous Q8H   insulin aspart  0-9 Units Subcutaneous TID WC   LORazepam  1 mg Oral QHS   rosuvastatin  20 mg Oral Daily   sodium chloride flush  3 mL Intravenous Q12H   cyanocobalamin  500 mcg Oral Daily   Continuous Infusions:  PRN Meds: acetaminophen **OR** acetaminophen, bisacodyl, hydrALAZINE, HYDROmorphone (DILAUDID) injection, ipratropium, levalbuterol, nitroGLYCERIN, ondansetron **OR** ondansetron (ZOFRAN) IV, oxyCODONE, senna-docusate, sodium phosphate, traZODone  Allergies:    Allergies  Allergen Reactions   Prednisone Other (See Comments)    "makes him feel faint"    Social History:   Social History   Tobacco Use   Smoking status: Former    Types: Cigarettes   Smokeless tobacco: Current    Types: Chew  Substance Use Topics   Alcohol use: No     Family History:    Family History  Problem Relation Age of Onset   Diabetes Mother    Aneurysm Mother        Brain   Stroke Father    Stroke Other    Colon cancer Neg Hx      ROS:  Please see the history  of present illness.  General:no colds or fevers, no weight changes Skin:no rashes or ulcers HEENT:no blurred vision, no congestion CV:see HPI PUL:see HPI GI:no diarrhea constipation or melena, no indigestion GU:no hematuria, no dysuria MS:no joint pain, no claudication Neuro:no syncope, no lightheadedness Endo:+ diabetes, no thyroid disease  All other ROS reviewed and negative.     Physical Exam/Data:   Vitals:   11/23/21 2313 11/23/21 2314 11/24/21 0416 11/24/21 0540  BP: (!) 141/62   (!) 141/59  Pulse: 70 72  70  Resp: 17   19  Temp: 98.8 F (37.1 C)   98.2 F (36.8 C)  TempSrc: Oral   Oral  SpO2: 93% 94% 95% 100%  Weight:   102.9 kg   Height:        Intake/Output Summary (Last 24 hours) at 11/24/2021 0907 Last data filed at 11/24/2021 0400 Gross per 24 hour  Intake 604.91 ml  Output 1600 ml  Net -995.09 ml      11/24/2021    4:16 AM 11/23/2021    6:32 AM 10/06/2021   10:09 AM  Last 3 Weights  Weight (lbs) 226 lb 13.7 oz 220 lb 207 lb  Weight (kg) 102.9 kg 99.791 kg 93.895 kg     Body mass index is 30.77 kg/m.  General:  Well nourished, well developed, in no acute distress sitting up in bed, + cough non productive now HEENT: normal Neck: no JVD sitting up Vascular: No carotid bruits; Distal pulses 2+ bilaterally Cardiac:  normal S1, S2; RRR; soft murmur, no gallup  Lungs:  clear to auscultation bilaterally, no wheezing, rhonchi or rales  Abd: soft, nontender, no hepatomegaly  Ext: 3-4+ lower ext edema Musculoskeletal:  No deformities, BUE and BLE strength normal and equal Skin: warm and dry  Neuro:  alert and oriented X 3, no focal abnormalities noted Psych:  Normal affect  Relevant CV Studies:  Echocardiogram 07/09/2020: IMPRESSIONS    1. Left ventricular ejection fraction, by estimation, is 60 to 65%. The  left ventricle has normal function. The left ventricle has no regional  wall motion abnormalities. There is moderate left ventricular hypertrophy.   Left ventricular diastolic  parameters are indeterminate. Elevated left atrial pressure.   2. Right ventricular systolic function is normal. The right ventricular  size is normal.   3. Left atrial size was severely dilated.   4. Right atrial size was mild to moderately dilated.   5. The pericardial effusion is circumferential.   6. The mitral valve is normal in structure. No evidence of mitral valve  regurgitation. No evidence of mitral stenosis.   7. The aortic valve is tricuspid. There is mild calcification of the  aortic valve. There is mild thickening of the aortic valve. Aortic valve  regurgitation is not visualized. Mild aortic valve stenosis. Aortic valve  mean gradient measures 13.4 mmHg.  Aortic valve peak gradient measures 26.1 mmHg. Aortic valve area, by VTI  measures 1.69 cm.   8. The inferior vena cava is normal in size with greater than 50%  respiratory variability, suggesting right atrial pressure of 3 mmHg.   Comparison(s): Echocardiogram done 05/20/12 showed an EF of 55-60% with  mild AS.   FINDINGS   Left Ventricle: Left ventricular ejection fraction, by estimation, is 60  to 65%. The left ventricle has normal function. The left ventricle has no  regional wall motion abnormalities. Global longitudinal strain performed  but not reported based on  interpreter judgement due to suboptimal tracking. The left ventricular  internal cavity size was normal in size. There is moderate left  ventricular hypertrophy. Left ventricular diastolic parameters are  indeterminate. Elevated left atrial pressure.   Right Ventricle: The right ventricular size is normal. No increase in  right ventricular wall thickness. Right ventricular systolic function is  normal.   Left Atrium: Left atrial size was severely dilated.   Right Atrium: Right atrial size was mild to moderately dilated.   Pericardium: Trivial pericardial effusion is present. The pericardial  effusion is  circumferential.   Mitral Valve: The mitral valve is normal in structure. No evidence of  mitral valve regurgitation. No evidence of mitral valve stenosis.   Tricuspid Valve: The tricuspid valve is normal in structure. Tricuspid  valve regurgitation is not demonstrated. No evidence of tricuspid  stenosis.   Aortic Valve: The aortic valve is tricuspid. There is mild calcification  of the aortic valve. There is mild thickening of the aortic valve. There  is mild aortic valve annular calcification. Aortic valve regurgitation is  not visualized. Mild aortic  stenosis is present. Aortic valve mean gradient measures 13.4 mmHg. Aortic  valve peak gradient measures 26.1 mmHg. Aortic valve area, by VTI measures  1.69 cm.   Pulmonic Valve: The pulmonic valve was not well visualized. Pulmonic valve  regurgitation is not visualized. No evidence of pulmonic stenosis.   Aorta: The aortic root is normal in size and structure.   Pulmonary Artery: Indeterminant PASP, inadequate TR jet.   Venous: The inferior vena cava is normal in size with greater than 50%  respiratory variability, suggesting right atrial pressure of 3 mmHg.   IAS/Shunts: No atrial level shunt detected by color flow Doppler.   Cardiac cath 2003   ANGIOGRAPHIC FINDINGS:  1. Left main coronary artery is free of significant flow-limiting coronary     atherosclerosis.  2. Left anterior descending has a 30%  proximal stenosis and a fairly focal     85% mid vessel stenosis. Coronary flow is TIMI-3. There is essentially     one large first diagonal branch.  3. The circumflex coronary artery has a proximal 50-60% stenosis. There is a     proximal obtuse marginal branch that is essentially ramus intermedius.  4. The right coronary artery is dominant with a 60-70% proximal stenosis as     well as 30% more mid to distal stenosis.    LEFT VENTRICULOGRAPHY:  Left ventriculography in the RAO projection reveals  an ejection fraction of  50-55% without focal wall motion abnormalities and  no significant mitral regurgitation.    DIAGNOSES:  1. Multivessel coronary artery disease, most significant in the left     anterior descending and right coronary arteries as outlined above.  2. Normal left ventricular ejection fraction of 50-55%.     RESULTS:  Initially the stenosis in the mid LAD was estimated at 90% and was  located just after and just before sharp bends in the vessel. Following  stenting this improved to 0%. There was no dissection seen.    CONCLUSIONS:  Successful stenting of the mid left anterior descending artery  lesion with a Cypher stent with improvement in percent diameter narrowing  from 90% to 0%.    DISPOSITION:  The patient was returned to the postangioplasty unit.  We will  plan intervention on the right coronary artery tomorrow.    CONCLUSION:  Successful stenting of the proximal right coronary artery  stenosis with improvement in percent diameter narrowing from 80% to less  than 10%.    DISPOSITION:  The patient was returned to the postangioplasty unit for  further observation  Laboratory Data:  High Sensitivity Troponin:   Recent Labs  Lab 11/23/21 0640  TROPONINIHS 82*     Chemistry Recent Labs  Lab 11/23/21 0640 11/24/21 0453  NA 137 138  K 4.5 3.9  CL 111 109  CO2 22 23  GLUCOSE 220* 161*  BUN 48* 49*  CREATININE 1.65* 1.68*  CALCIUM 8.6* 8.6*  MG 2.1  --   GFRNONAA 41* 40*  ANIONGAP 4* 6     Hematology Recent Labs  Lab 11/23/21 0640 11/24/21 0453  WBC 9.5 7.0  RBC 3.11* 2.81*  HGB 9.8* 8.7*  HCT 30.1* 27.1*  MCV 96.8 96.4  MCH 31.5 31.0  MCHC 32.6 32.1  RDW 13.8 13.6  PLT 257 227    BNP Recent Labs  Lab 11/23/21 0640 11/24/21 0454  BNP 308.0* 293.0*    DDimer  Recent Labs  Lab 11/23/21 0640  DDIMER 2.40*     Radiology/Studies:  DG CHEST PORT 1 VIEW  Result Date: 11/24/2021 CLINICAL DATA:  Worsening shortness of breath with exertion. EXAM:  PORTABLE CHEST 1 VIEW COMPARISON:  Chest radiograph 1 day prior FINDINGS: The cardiomediastinal silhouette is stable. Moderate right larger than left pleural effusions are again seen with patchy opacities in the lower lobes, right worse than left. There is vascular congestion and mild pulmonary interstitial edema. Overall, aeration appears slightly worsened in the right lower lobe compared to the study from 1 day prior. There is no pneumothorax The bones are stable. IMPRESSION: Moderate right larger than left pleural effusions with adjacent opacities in the lower lobes, worsened in the right base compared to the study from 1 day prior. Electronically Signed   By: Valetta Mole M.D.   On: 11/24/2021 08:16   CT Angio Chest PE W and/or  Wo Contrast  Result Date: 11/23/2021 CLINICAL DATA:  History of MALT lymphoma. Exertional dyspnea. Clinical concern for pulmonary embolus. EXAM: CT ANGIOGRAPHY CHEST WITH CONTRAST TECHNIQUE: Multidetector CT imaging of the chest was performed using the standard protocol during bolus administration of intravenous contrast. Multiplanar CT image reconstructions and MIPs were obtained to evaluate the vascular anatomy. RADIATION DOSE REDUCTION: This exam was performed according to the departmental dose-optimization program which includes automated exposure control, adjustment of the mA and/or kV according to patient size and/or use of iterative reconstruction technique. CONTRAST:  13m OMNIPAQUE IOHEXOL 350 MG/ML SOLN COMPARISON:  PET-CT 08/28/2021 FINDINGS: Cardiovascular: Heart is enlarged. Coronary artery calcification is evident. Mild atherosclerotic calcification is noted in the wall of the thoracic aorta. There is no filling defect within the opacified pulmonary arteries to suggest the presence of an acute pulmonary embolus. Mediastinum/Nodes: No mediastinal lymphadenopathy. There is no hilar lymphadenopathy. The esophagus has normal imaging features. There is no axillary  lymphadenopathy. Lungs/Pleura: Fine architectural lung detail obscured by substantial breathing motion during image acquisition. Probable component of underlying emphysema. There is collapse/consolidation in both lower lobes with superimposed patchy bilateral ground-glass opacities. Moderate right and small to moderate left pleural effusions evident. Upper Abdomen: Unremarkable. Musculoskeletal: No worrisome lytic or sclerotic osseous abnormality. Review of the MIP images confirms the above findings. IMPRESSION: 1. No CT evidence for acute pulmonary embolus. 2. Moderate right and small to moderate left pleural effusions with collapse/consolidation in both lower lobes. 3. Patchy bilateral ground-glass opacities, compatible with edema and/or infection. 4. Aortic Atherosclerosis (ICD10-I70.0). Electronically Signed   By: EMisty StanleyM.D.   On: 11/23/2021 08:59   DG Chest 2 View  Result Date: 11/23/2021 CLINICAL DATA:  Shortness of breath. EXAM: CHEST - 2 VIEW COMPARISON:  11/23/2021 FINDINGS: Stable cardiac enlargement. Aortic atherosclerotic calcifications. Small bilateral pleural effusions noted, right greater than left. Mild diffuse interstitial edema. No airspace consolidation. IMPRESSION: 1. Moderate congestive heart failure. Electronically Signed   By: TKerby MoorsM.D.   On: 11/23/2021 07:08     Assessment and Plan:   Acute on chronic shortness of breath with mildly elevated BNP and pleural effusions, echo pending, could be diastolic HF.  He is responding to lasix 40 mg BID IV--negative 995 since admit - wt went up, not sure how accurate. Continue lasix.  Still with large amount of lower ext edema.  Monitor Cr currently stable at 1.68  REDs measurement has been ordered.  Bilateral pl effusion for thoracentesis today.  CAD with hx in 2003 of stents to LAD and RCA.  He does have chest pain more with cough and increase of troponin to 85 which most likely reflects demand ischemia.  Will recheck one  more troponin. Continue ASA and BB CKD stage 3b, monitor with diuresis.  DM-2/Malt lymphoma/ rectal mass/Iron def anemia per IM (had been cleared for rectal mass dissection end of August will reassess by end of stay.  HLD continue statin.    Risk Assessment/Risk Scores:     HEAR Score (for undifferentiated chest pain):     New York Heart Association (NYHA) Functional Class NYHA Class III        For questions or updates, please contact [facility] Please consult [email or web address] for contact info under    Signed, LCecilie Kicks NP  11/24/2021 9:07 AM    Attending note:  Patient seen and examined.  I reviewed his recent records and discussed the case with Ms. Ingold NP, agree with her above findings.  Mr.  Jaggers presents with worsening shortness of breath over the last 6 months, acutely worse in the last few weeks and associated with weight gain, orthopnea, and intermittent cough.  He has a history of CAD status post DES to the LAD and RCA in 2003, normal LVEF as of March 2022, and mild aortic stenosis.  History also complicated by rectal mass pending surgical excision, multiple lymphoma, MGUS, and chronic anemia.  On examination this morning he is in no distress.  Afebrile, systolic blood pressure in the 140s.  Oxygen saturation mid to high 90s on 2 L nasal cannula.  Lungs exhibit decreased breath sounds at the bases.  Cardiac exam with RRR and 2/6 to 3/6 systolic murmur.  He has 2-3+ peripheral edema.  Pertinent lab work includes potassium 3.9, BUN 49, creatinine 1.68, BNP 293, high-sensitivity troponin I 82, hemoglobin 8.7, platelets 227.  Chest CTA shows no pulmonary embolus, moderate right sided and small to moderate left-sided pleural effusions with collapse/consolidation of the lower lobes, patchy bilateral groundglass opacities.  ECG shows sinus rhythm with prolonged PR interval and IVCD of left bundle branch block type.  Would plan to repeat echocardiogram to reassess  cardiac structure and function.  Possibly acute on chronic HFpEF uness there has been progressive LV dysfunction since last assessment, also need to evaluate RV function and pressures.  Doubt that aortic stenosis has progressed to the point of causing symptoms since last year.  Patient proceeding with thoracentesis, would assess for transudate versus exudate.  Continue IV Lasix for now with repeat BMET tomorrow.  Our service will continue to follow with you.  Satira Sark, M.D., F.A.C.C.

## 2021-11-24 NOTE — Progress Notes (Signed)
Thoracentesis complete no signs of distress. Patient being taken for post-thoracentesis chest xray.

## 2021-11-25 DIAGNOSIS — I350 Nonrheumatic aortic (valve) stenosis: Secondary | ICD-10-CM | POA: Diagnosis not present

## 2021-11-25 DIAGNOSIS — R0602 Shortness of breath: Secondary | ICD-10-CM | POA: Diagnosis not present

## 2021-11-25 DIAGNOSIS — I5031 Acute diastolic (congestive) heart failure: Secondary | ICD-10-CM

## 2021-11-25 DIAGNOSIS — J90 Pleural effusion, not elsewhere classified: Secondary | ICD-10-CM | POA: Diagnosis not present

## 2021-11-25 LAB — GLUCOSE, CAPILLARY
Glucose-Capillary: 142 mg/dL — ABNORMAL HIGH (ref 70–99)
Glucose-Capillary: 287 mg/dL — ABNORMAL HIGH (ref 70–99)
Glucose-Capillary: 186 mg/dL — ABNORMAL HIGH (ref 70–99)
Glucose-Capillary: 173 mg/dL — ABNORMAL HIGH (ref 70–99)

## 2021-11-25 LAB — CBC
HCT: 25.7 % — ABNORMAL LOW (ref 39.0–52.0)
Hemoglobin: 8.4 g/dL — ABNORMAL LOW (ref 13.0–17.0)
MCH: 31.5 pg (ref 26.0–34.0)
MCHC: 32.7 g/dL (ref 30.0–36.0)
MCV: 96.3 fL (ref 80.0–100.0)
Platelets: 209 10*3/uL (ref 150–400)
RBC: 2.67 MIL/uL — ABNORMAL LOW (ref 4.22–5.81)
RDW: 13.3 % (ref 11.5–15.5)
WBC: 6.5 10*3/uL (ref 4.0–10.5)
nRBC: 0 % (ref 0.0–0.2)

## 2021-11-25 LAB — BASIC METABOLIC PANEL
Anion gap: 6 (ref 5–15)
BUN: 61 mg/dL — ABNORMAL HIGH (ref 8–23)
CO2: 23 mmol/L (ref 22–32)
Calcium: 8.3 mg/dL — ABNORMAL LOW (ref 8.9–10.3)
Chloride: 108 mmol/L (ref 98–111)
Creatinine, Ser: 1.92 mg/dL — ABNORMAL HIGH (ref 0.61–1.24)
GFR, Estimated: 34 mL/min — ABNORMAL LOW (ref >60)
Glucose, Bld: 164 mg/dL — ABNORMAL HIGH (ref 70–99)
Potassium: 3.9 mmol/L (ref 3.5–5.1)
Sodium: 137 mmol/L (ref 135–145)

## 2021-11-25 LAB — LACTATE DEHYDROGENASE: LDH: 130 U/L (ref 98–192)

## 2021-11-25 LAB — RETICULOCYTES
Immature Retic Fract: 15.6 % (ref 2.3–15.9)
RBC.: 2.72 MIL/uL — ABNORMAL LOW (ref 4.22–5.81)
Retic Count, Absolute: 31.3 10*3/uL (ref 19.0–186.0)
Retic Ct Pct: 1.2 % (ref 0.4–3.1)

## 2021-11-25 LAB — ALBUMIN: Albumin: 2.9 g/dL — ABNORMAL LOW (ref 3.5–5.0)

## 2021-11-25 LAB — IRON AND TIBC
Iron: 22 ug/dL — ABNORMAL LOW (ref 45–182)
Saturation Ratios: 10 % — ABNORMAL LOW (ref 17.9–39.5)
TIBC: 222 ug/dL — ABNORMAL LOW (ref 250–450)
UIBC: 200 ug/dL

## 2021-11-25 LAB — BRAIN NATRIURETIC PEPTIDE: B Natriuretic Peptide: 222 pg/mL — ABNORMAL HIGH (ref 0.0–100.0)

## 2021-11-25 LAB — FERRITIN: Ferritin: 246 ng/mL (ref 24–336)

## 2021-11-25 MED ORDER — IRON DEXTRAN 50 MG/ML IJ SOLN
100.0000 mg | Freq: Once | INTRAMUSCULAR | Status: DC
Start: 2021-11-25 — End: 2021-11-25

## 2021-11-25 MED ORDER — SODIUM CHLORIDE 0.9 % IV SOLN
250.0000 mg | Freq: Once | INTRAVENOUS | Status: AC
  Administered 2021-11-25: 250 mg via INTRAVENOUS
  Filled 2021-11-25: qty 20

## 2021-11-25 MED ORDER — FUROSEMIDE 10 MG/ML IJ SOLN
40.0000 mg | Freq: Once | INTRAMUSCULAR | Status: AC
  Administered 2021-11-25: 40 mg via INTRAVENOUS
  Filled 2021-11-25: qty 4

## 2021-11-25 MED ORDER — FUROSEMIDE 40 MG PO TABS
40.0000 mg | ORAL_TABLET | Freq: Every day | ORAL | Status: DC
  Administered 2021-11-26 – 2021-12-01 (×6): 40 mg via ORAL
  Filled 2021-11-25 (×6): qty 1

## 2021-11-25 NOTE — Progress Notes (Signed)
PROGRESS NOTE    Patient: Bradley Sheppard                            PCP: [name], MD                    DOB: [birth year]            DOA: 11/23/2021 VVO:[id number]             DOS: 11/25/2021, 10:50 AM   LOS: 2 days   Date of Service: The patient was seen and examined on 11/25/2021  Subjective:   The patient was seen and examined this morning, improved shortness of breath, status post thoracentesis yesterday tolerated procedure well, 1.6 L of fluid from a right was removed Patient was as high as 3 L of oxygen continue to sat 98%, currently on room air satting 94%   Brief Narrative:   Bradley Sheppard is a 81 year old male with extensive history of DM 2, HTN, HLD,MALT lymphoma, CAD presented to ED with chief complaint of shortness of breath -Patient reports shortness of breath has been going on for the past week, progressively getting worse especially with exertion. He denies any chest pain or cough.  Denies of having any recent illnesses States that he has CAD but has not been diagnosed with CHF.  And he is not O2 dependent.    ED course: Blood pressure 133/71, pulse 70, temperature 98.2 F (36.8 C), temperature source Oral, resp. rate 20, height 6' (1.829 m), weight 99.8 kg, SpO2 97 % on 2 L  CBC WBC 9.5, hemoglobin 9.8, CMP within normal limits with exception of BUN 48, creatinine 1.65, calcium 8.6, troponin 82 glucose 220 BNP 308, Total iron 44 D-dimer 2.4,  Chest x-ray: Moderate congestive heart failure CTA: 1. No CT evidence for acute pulmonary embolus. 2. Moderate right and small to moderate left pleural effusions with collapse/consolidation in both lower lobes. 3. Patchy bilateral ground-glass opacities, compatible with edema and/or infection. 4. Aortic Atherosclerosis      Assessment & Plan:   Principal Problem:   SOB (shortness of breath) Active Problems:   CHF (congestive heart failure), NYHA class I, acute, diastolic (HCC)   Bilateral pleural  effusion   CKD (chronic kidney disease), stage II   DMII (diabetes mellitus, type 2) (HCC)   Rectal mass   Mixed hyperlipidemia   Essential hypertension, benign   CORONARY ATHEROSCLEROSIS NATIVE CORONARY ARTERY   Normochromic normocytic anemia   Iron deficiency anemia   MALT lymphoma (HCC)     Assessment and Plan: * SOB (shortness of breath) - Currently satting 100% on 2 L of oxygen - Likely due to new onset CHF and exacerbation, also bilateral pleural effusion  -Elevated proBNP, in the setting of CKD  -CTA chest reviewed: No evidence of PE -Continue diuretics, as needed DuoNeb bronchodilators  -Echocardiogram from March 2022 reviewed, ejection fraction preserved, LVH with dilated right atrial mild aortic stenosis  -Continue supplemental oxygen, with plan to wean off oxygen Not oxygen dependent at baseline  -Continue IV diuretics, as needed DuoNeb bronchodilators -Proceed with thoracentesis -Echocardiogram  Bilateral pleural effusion -Planning for ultrasound-guided thoracentesis today 11/24/2021 -With fluid analysis - CT angiogram reviewed moderate right> small left pleural effusion -May be contributing to shortness of breath  CHF (congestive heart failure), NYHA class I, acute, diastolic ([place]) -Possible new onset -appears to be preserved diastolic failure  -Presenting with shortness of breath,  lower extremity edema -Elevated proBNP, in the setting of CKD Echo from 07/09/2020 reviewed, LVH, severely dilated right, ejection fraction 60,  mild AAS  -Due to new onset of symptoms of CHF, repeating 2D - Echocardiogram>> -Consulting cardiology for evaluation -appreciate input -Recycle cardiac enzymes -Possible preserved ejection fraction diastolic failure -We will continue with IV diuretics 40 mg of Lasix twice daily -Daily weight: 102.9 kg -We will monitor I's and O's -We will monitor labs, kidney function - ReDs Clip reading      DMII (diabetes mellitus, type 2)  (HCC) - We will check CBG q. ACH S, with SSI coverage -Home medication of metformin, Ozempic, -Resuming Glucotrol -Anticipating discontinuing his home medication of Actos -Checking A1c 7.6  CKD (chronic kidney disease), stage II - Creatinine 1.65 >>> 1.68 -BUN 48, 49 -GFR 41, 40 -Baseline creatinine this year 1.28- 1.53 Lab Results  Component Value Date   CREATININE 1.68 (H) 11/24/2021   CREATININE 1.65 (H) 11/23/2021   CREATININE 1.28 (H) 11/07/2021    -Avoiding nephrotoxins -Unfortunately patient needs IV Lasix -We will continue monitor BUN/creatinine and GFR closely  Rectal mass -Being followed closely as an outpatient -Per patient and family planning for resection in August  MALT lymphoma ([place]) - Currently stable -Family requested Dr. Raliegh Ip to be consulted Consultation has been placed  Iron deficiency anemia - Iron deficiency, 1 dose IV iron -P.o. iron supplements   CORONARY ATHEROSCLEROSIS NATIVE CORONARY ARTERY - Stable denies any chest pain -Continue as needed nitroglycerin, continue home medications of statins, beta-blocker and aspirin  Essential hypertension, benign - Stable resuming home medication Norvasc and Coreg with exception of hydralazine, valsartan...  -will be on IV Lasix, and a on CKD  Mixed hyperlipidemia - Continue statins     ----------------------------------------------------------------------------------------------------------------------------------------------- Nutritional status:  The patient's BMI is: Body mass index is 29.78 kg/m. I agree with the assessment and plan as outlined   DVT prophylaxis:  heparin injection 5,000 Units Start: 11/23/21 2200 TED hose Start: 11/23/21 0916 SCDs Start: 11/23/21 0915   Code Status:   Code Status: Full Code  Family Communication: Daughter, wife present at bedside updated The above findings and plan of care has been discussed with patient (and family)  in detail,  they expressed  understanding and agreement of above. -Advance care planning has been discussed.   Admission status:   Status is: Inpatient Remains inpatient appropriate because: Needing supplemental oxygen, evaluation for hypoxia, edema, likely new onset CHF,   Patient remain in hospital for 1 more day due to SOB, volume overload from CHF, needing further diuresing -medication adjustment per cardiology recommendation, monitoring kidney function.  Disposition: Likely home in next 24 hours if patient remains stable   Procedures:   No admission procedures for hospital encounter.   Antimicrobials:  Anti-infectives (From admission, onward)    None        Medication:   amLODipine  10 mg Oral Daily   aspirin  81 mg Oral Daily   carvedilol  18.75 mg Oral BID WC   docusate sodium  100 mg Oral Daily   ferrous sulfate  325 mg Oral BID WC   furosemide  40 mg Intravenous Once   [START ON 11/26/2021] furosemide  40 mg Oral Daily   glipiZIDE  10 mg Oral BID WC   heparin  5,000 Units Subcutaneous Q8H   insulin aspart  0-9 Units Subcutaneous TID WC   LORazepam  1 mg Oral QHS   rosuvastatin  20 mg Oral Daily  sodium chloride flush  3 mL Intravenous Q12H   cyanocobalamin  500 mcg Oral Daily    acetaminophen **OR** acetaminophen, bisacodyl, hydrALAZINE, HYDROmorphone (DILAUDID) injection, ipratropium, levalbuterol, nitroGLYCERIN, ondansetron **OR** ondansetron (ZOFRAN) IV, oxyCODONE, senna-docusate, sodium phosphate, traZODone   Objective:   Vitals:   11/24/21 1403 11/24/21 2200 11/25/21 0500 11/25/21 1007  BP: (!) 130/55   136/60  Pulse: 66   68  Resp: 18   16  Temp: 98.4 F (36.9 C)   98 F (36.7 C)  TempSrc:      SpO2: 98% 98%  94%  Weight:   99.6 kg   Height:        Intake/Output Summary (Last 24 hours) at 11/25/2021 1050 Last data filed at 11/24/2021 2000 Gross per 24 hour  Intake --  Output 1500 ml  Net -1500 ml   Filed Weights   11/23/21 6269 11/24/21 0416 11/25/21 0500   Weight: 99.8 kg 102.9 kg 99.6 kg     Examination:   Physical Exam  Constitution:  SOB but Alert, cooperative, no distress,  Appears calm and comfortable  Psychiatric:   Normal and stable mood and affect, cognition intact,   HEENT:        Normocephalic, PERRL, otherwise with in Normal limits  Chest:         Chest symmetric Cardio vascular:  S1/S2, RRR, No murmure, No Rubs or Gallops  pulmonary: Clear to auscultation bilaterally, respirations unlabored, negative wheezes / crackles Abdomen: Soft, non-tender, non-distended, bowel sounds,no masses, no organomegaly Muscular skeletal: Limited exam - in bed, able to move all 4 extremities,   Neuro: CNII-XII intact. , normal motor and sensation, reflexes intact  Extremities: +2  pitting edema lower extremities, +2 pulses  Skin: Dry, warm to touch, negative for any Rashes, No open wounds Wounds: per nursing documentation   ------------------------------------------------------------------------------------------------------------------------------------------    LABs:     Latest Ref Rng & Units 11/25/2021    5:52 AM 11/24/2021    4:53 AM 11/23/2021    6:40 AM  CBC  WBC 4.0 - 10.5 K/uL 6.5  7.0  9.5   Hemoglobin 13.0 - 17.0 g/dL 8.4  8.7  9.8   Hematocrit 39.0 - 52.0 % 25.7  27.1  30.1   Platelets 150 - 400 K/uL 209  227  257       Latest Ref Rng & Units 11/25/2021    5:52 AM 11/24/2021    4:53 AM 11/23/2021    6:40 AM  CMP  Glucose 70 - 99 mg/dL 164  161  220   BUN 8 - 23 mg/dL 61  49  48   Creatinine 0.61 - 1.24 mg/dL 1.92  1.68  1.65   Sodium 135 - 145 mmol/L 137  138  137   Potassium 3.5 - 5.1 mmol/L 3.9  3.9  4.5   Chloride 98 - 111 mmol/L 108  109  111   CO2 22 - 32 mmol/L '23  23  22   '$ Calcium 8.9 - 10.3 mg/dL 8.3  8.6  8.6        Micro Results Recent Results (from the past 240 hour(s))  Gram stain     Status: None   Collection Time: 11/24/21 12:40 PM   Specimen: Pleura  Result Value Ref Range Status   Specimen  Description PLEURAL  Final   Special Requests PLEURAL  Final   Gram Stain   Final    WBC PRESENT,BOTH PMN AND MONONUCLEAR NO ORGANISMS SEEN CYTOSPIN SMEAR Performed  at [facility], [address] [place], [place] [address]    Report Status 11/24/2021 FINAL  Final  Culture, body fluid w Gram Stain-bottle     Status: None (Preliminary result)   Collection Time: 11/24/21 12:40 PM   Specimen: Pleura  Result Value Ref Range Status   Specimen Description PLEURAL  Final   Special Requests   Final    BOTTLES DRAWN AEROBIC AND ANAEROBIC Blood Culture results may not be optimal due to an excessive volume of blood received in culture bottles   Culture   Final    NO GROWTH < 12 HOURS Performed at [facility], [address], [place], [place] [address]    Report Status PENDING  Incomplete    Radiology Reports DG Chest 1 View  Result Date: 11/24/2021 CLINICAL DATA:  RIGHT pleural effusion post thoracentesis EXAM: CHEST  1 VIEW COMPARISON:  11/24/2021 FINDINGS: Enlargement of cardiac silhouette with pulmonary vascular congestion. Markedly decreased RIGHT pleural effusion and basilar atelectasis. Residual small LEFT pleural effusion and basilar atelectasis. Interstitial infiltrate consistent with pulmonary edema/CHF. Atherosclerotic calcification aorta. No pneumothorax following thoracentesis. Osseous demineralization. IMPRESSION: Mild CHF with decreased RIGHT pleural effusion and basilar atelectasis post thoracentesis. No pneumothorax following RIGHT thoracentesis. Electronically Signed   By: Lavonia Dana M.D.   On: 11/24/2021 13:20   US THORACENTESIS ASP PLEURAL SPACE W/IMG GUIDE  Result Date: 11/24/2021 Lavonia Dana, MD     11/24/2021  1:19 PM PreOperative Dx: RIGHT pleural effusion Postoperative Dx: RIGHT pleural effusion Procedure:   US guided RIGHT thoracentesis Radiologist:  Thornton Papas Anesthesia:  10 ml of 1% lidocaine Specimen:  1.6 L of sl cloudy yellow colored fluid EBL:   < 1 ml Complications:  None    ECHOCARDIOGRAM COMPLETE  Result Date: 11/24/2021    ECHOCARDIOGRAM REPORT   Patient Name:   TANIELA FELTUS Date of Exam: 11/24/2021 Medical Rec #:  [id number]        Height:       72.0 in Accession #:    [id number]       Weight:       226.9 lb Date of Birth:  August 09, 1940        BSA:          2.248 m Patient Age:    72 years         BP:           141/59 mmHg Patient Gender: M                HR:           62 bpm. Exam Location:  [facility] Procedure: 2D Echo, Cardiac Doppler and Color Doppler Indications:    I33.9 SBE  History:        Patient has prior history of Echocardiogram examinations, most                 recent 07/09/2020. CHF, CAD and Previous Myocardial Infarction,                 Aortic Valve Disease, Signs/Symptoms:Murmur; Risk                 Factors:Hypertension, Dyslipidemia, Former Smoker and Diabetes.                 Hx of CKD.  Sonographer:    Alvino Chapel RCS Referring Phys: [facility]  1. Left ventricular ejection fraction, by estimation, is 65 to 70%. The left ventricle has  normal function. The left ventricle has no regional wall motion abnormalities. There is moderate concentric left ventricular hypertrophy. Left ventricular diastolic parameters are consistent with Grade II diastolic dysfunction (pseudonormalization). Elevated left ventricular end-diastolic pressure.  2. Right ventricular systolic function is normal. The right ventricular size is normal. Tricuspid regurgitation signal is inadequate for assessing PA pressure.  3. Left atrial size was severely dilated.  4. Right atrial size was moderately dilated.  5. A small pericardial effusion is present. The pericardial effusion is posterior to the left ventricle and anterior to the right ventricle. There is no evidence of cardiac tamponade.  6. The mitral valve is degenerative. Trivial mitral valve regurgitation.  7. The aortic valve is tricuspid with restricted motion of the left coronary cusp. There is  moderate calcification of the aortic valve. Moderate aortic valve stenosis. Aortic valve mean gradient measures 18.0 mmHg. Dimentionless index 0.44.  8. The inferior vena cava is dilated in size with >50% respiratory variability, suggesting right atrial pressure of 8 mmHg. Comparison(s): Prior images reviewed side by side. LVEF vigorous with moderate diastolic dysfunction and increased LV filling pressure. Moderate aortic stenosis, mean gradient has increased from 13 mmHg to 18 mmHg. FINDINGS  Left Ventricle: Left ventricular ejection fraction, by estimation, is 65 to 70%. The left ventricle has normal function. The left ventricle has no regional wall motion abnormalities. The left ventricular internal cavity size was normal in size. There is  moderate concentric left ventricular hypertrophy. Left ventricular diastolic parameters are consistent with Grade II diastolic dysfunction (pseudonormalization). Elevated left ventricular end-diastolic pressure. Right Ventricle: The right ventricular size is normal. No increase in right ventricular wall thickness. Right ventricular systolic function is normal. Tricuspid regurgitation signal is inadequate for assessing PA pressure. Left Atrium: Left atrial size was severely dilated. Right Atrium: Right atrial size was moderately dilated. Pericardium: A small pericardial effusion is present. The pericardial effusion is posterior to the left ventricle and anterior to the right ventricle. There is no evidence of cardiac tamponade. Mitral Valve: The mitral valve is degenerative in appearance. There is mild thickening of the mitral valve leaflet(s). There is mild calcification of the mitral valve leaflet(s). Trivial mitral valve regurgitation. Tricuspid Valve: The tricuspid valve is grossly normal. Tricuspid valve regurgitation is trivial. Aortic Valve: The aortic valve is tricuspid. There is moderate calcification of the aortic valve. There is mild to moderate aortic valve annular  calcification. Aortic valve regurgitation is not visualized. Moderate aortic stenosis is present. Aortic valve mean gradient measures 18.0 mmHg. Aortic valve peak gradient measures 33.4 mmHg. Aortic valve area, by VTI measures 1.37 cm. Pulmonic Valve: The pulmonic valve was grossly normal. Pulmonic valve regurgitation is trivial. Aorta: The aortic root is normal in size and structure. Venous: The inferior vena cava is dilated in size with greater than 50% respiratory variability, suggesting right atrial pressure of 8 mmHg. IAS/Shunts: No atrial level shunt detected by color flow Doppler.  LEFT VENTRICLE PLAX 2D LVIDd:         4.40 cm   Diastology LVIDs:         2.70 cm   LV e' medial:    7.29 cm/s LV PW:         1.50 cm   LV E/e' medial:  18.9 LV IVS:        1.70 cm   LV e' lateral:   6.74 cm/s LVOT diam:     2.00 cm   LV E/e' lateral: 20.5 LV SV:  92 LV SV Index:   41 LVOT Area:     3.14 cm  RIGHT VENTRICLE RV S prime:     15.80 cm/s TAPSE (M-mode): 2.8 cm LEFT ATRIUM              Index        RIGHT ATRIUM           Index LA diam:        4.30 cm  1.91 cm/m   RA Area:     26.20 cm LA Vol (A2C):   99.3 ml  44.18 ml/m  RA Volume:   90.00 ml  40.04 ml/m LA Vol (A4C):   138.0 ml 61.39 ml/m LA Biplane Vol: 119.0 ml 52.94 ml/m  AORTIC VALVE AV Area (Vmax):    1.33 cm AV Area (Vmean):   1.35 cm AV Area (VTI):     1.37 cm AV Vmax:           289.00 cm/s AV Vmean:          199.000 cm/s AV VTI:            0.669 m AV Peak Grad:      33.4 mmHg AV Mean Grad:      18.0 mmHg LVOT Vmax:         122.33 cm/s LVOT Vmean:        85.400 cm/s LVOT VTI:          0.292 m LVOT/AV VTI ratio: 0.44  AORTA Ao Root diam: 3.70 cm MITRAL VALVE MV Area (PHT): 3.03 cm     SHUNTS MV Decel Time: 250 msec     Systemic VTI:  0.29 m MV E velocity: 138.00 cm/s  Systemic Diam: 2.00 cm MV A velocity: 77.10 cm/s MV E/A ratio:  1.79 Rozann Lesches MD Electronically signed by Rozann Lesches MD Signature Date/Time: 11/24/2021/11:51:56 AM     Final     SIGNED: Deatra James, MD, FHM. [facility],  Pager (please use [email or web address] to page/text) Please use Epic Secure Chat for non-urgent communication (7AM-7PM)  If 7PM-7AM, please contact night-coverage [email or web address], 11/25/2021, 10:50 AM

## 2021-11-25 NOTE — Progress Notes (Signed)
DAILY PROGRESS NOTE   Patient Name: Bradley Sheppard Date of Encounter: 11/25/2021 Cardiologist: Rozann Lesches, MD  Chief Complaint   Breathing better  Patient Profile   Bradley Sheppard is a 81 y.o. male with a hx of CAD, mild AS, bilateral carotid artery stenosis, HTN, HLD, CKD stage 3b, DM-2, GERD, arthritis, iron def anemia, non hodgkin's lymphoma and rectal tumor needing surgery who is being seen 11/24/2021 for the evaluation of shortness of breath at the request of Dr. Roger Shelter.  Subjective   S/p thoracentesis yesterday - net negative 4L (1.6L thoracentesis + 1.5L urine).  REDS vest 35 today. Weight is down to 99.8 kg (from 102.9 kg). BNP 222 today - creatinine 1.92 (up from 1.68). Noted to have an iron deficiency anemia. Echo yesterday shows LVEF 65-70%, grade 2 DD, severe LAE, moderate RAE, small pericardial effusion without tamponade. Moderate aortic stenosis - mean gradient 18 mmHg. Noted to have 7 beat run of NSVT overnight, asymptomatic.  Objective   Vitals:   11/24/21 1246 11/24/21 1403 11/24/21 2200 11/25/21 0500  BP: 134/63 (!) 130/55    Pulse: 62 66    Resp: 20 18    Temp:  98.4 F (36.9 C)    TempSrc:      SpO2: 98% 98% 98%   Weight:    99.6 kg  Height:        Intake/Output Summary (Last 24 hours) at 11/25/2021 0843 Last data filed at 11/24/2021 2000 Gross per 24 hour  Intake --  Output 1500 ml  Net -1500 ml   Filed Weights   11/23/21 6734 11/24/21 0416 11/25/21 0500  Weight: 99.8 kg 102.9 kg 99.6 kg    Physical Exam   General appearance: alert and no distress Neck: JVD - 3 cm above sternal notch, no carotid bruit, and thyroid not enlarged, symmetric, no tenderness/mass/nodules Lungs: diminished breath sounds bilaterally and rales RLL Heart: regular rate and rhythm Abdomen: soft, non-tender; bowel sounds normal; no masses,  no organomegaly Extremities: edema 2+ bilateral LE Pulses: 2+ and symmetric Skin: pale, warm, dry Neurologic: Grossly  normal Psych: Pleasant  Inpatient Medications    Scheduled Meds:  amLODipine  10 mg Oral Daily   aspirin  81 mg Oral Daily   carvedilol  18.75 mg Oral BID WC   docusate sodium  100 mg Oral Daily   ferrous sulfate  325 mg Oral BID WC   furosemide  40 mg Intravenous Q12H   glipiZIDE  10 mg Oral BID WC   heparin  5,000 Units Subcutaneous Q8H   insulin aspart  0-9 Units Subcutaneous TID WC   LORazepam  1 mg Oral QHS   rosuvastatin  20 mg Oral Daily   sodium chloride flush  3 mL Intravenous Q12H   cyanocobalamin  500 mcg Oral Daily    Continuous Infusions:  ferric gluconate (FERRLECIT) IVPB      PRN Meds: acetaminophen **OR** acetaminophen, bisacodyl, hydrALAZINE, HYDROmorphone (DILAUDID) injection, ipratropium, levalbuterol, nitroGLYCERIN, ondansetron **OR** ondansetron (ZOFRAN) IV, oxyCODONE, senna-docusate, sodium phosphate, traZODone   Labs   Results for orders placed or performed during the hospital encounter of 11/23/21 (from the past 48 hour(s))  Glucose, capillary     Status: Abnormal   Collection Time: 11/23/21 11:56 AM  Result Value Ref Range   Glucose-Capillary 197 (H) 70 - 99 mg/dL    Comment: Glucose reference range applies only to samples taken after fasting for at least 8 hours.  Glucose, capillary     Status: Abnormal  Collection Time: 11/23/21  3:57 PM  Result Value Ref Range   Glucose-Capillary 227 (H) 70 - 99 mg/dL    Comment: Glucose reference range applies only to samples taken after fasting for at least 8 hours.  Glucose, capillary     Status: Abnormal   Collection Time: 11/23/21  9:47 PM  Result Value Ref Range   Glucose-Capillary 241 (H) 70 - 99 mg/dL    Comment: Glucose reference range applies only to samples taken after fasting for at least 8 hours.  Basic metabolic panel     Status: Abnormal   Collection Time: 11/24/21  4:53 AM  Result Value Ref Range   Sodium 138 135 - 145 mmol/L   Potassium 3.9 3.5 - 5.1 mmol/L   Chloride 109 98 - 111 mmol/L    CO2 23 22 - 32 mmol/L   Glucose, Bld 161 (H) 70 - 99 mg/dL    Comment: Glucose reference range applies only to samples taken after fasting for at least 8 hours.   BUN 49 (H) 8 - 23 mg/dL   Creatinine, Ser 1.68 (H) 0.61 - 1.24 mg/dL   Calcium 8.6 (L) 8.9 - 10.3 mg/dL   GFR, Estimated 40 (L) >60 mL/min    Comment: (NOTE) Calculated using the CKD-EPI Creatinine Equation (2021)    Anion gap 6 5 - 15    Comment: Performed at [facility], [address] [place], [place] [address]  CBC     Status: Abnormal   Collection Time: 11/24/21  4:53 AM  Result Value Ref Range   WBC 7.0 4.0 - 10.5 K/uL   RBC 2.81 (L) 4.22 - 5.81 MIL/uL   Hemoglobin 8.7 (L) 13.0 - 17.0 g/dL   HCT 27.1 (L) 39.0 - 52.0 %   MCV 96.4 80.0 - 100.0 fL   MCH 31.0 26.0 - 34.0 pg   MCHC 32.1 30.0 - 36.0 g/dL   RDW 13.6 11.5 - 15.5 %   Platelets 227 150 - 400 K/uL   nRBC 0.0 0.0 - 0.2 %    Comment: Performed at [facility], [address] [place], [place] [address]  Protime-INR     Status: None   Collection Time: 11/24/21  4:53 AM  Result Value Ref Range   Prothrombin Time 14.2 11.4 - 15.2 seconds   INR 1.1 0.8 - 1.2    Comment: (NOTE) INR goal varies based on device and disease states. Performed at [facility], [address] [place], [place] [address]   APTT     Status: None   Collection Time: 11/24/21  4:53 AM  Result Value Ref Range   aPTT 34 24 - 36 seconds    Comment: Performed at [facility], [address] [place], [place] [address]  Procalcitonin - Baseline     Status: None   Collection Time: 11/24/21  4:53 AM  Result Value Ref Range   Procalcitonin 0.20 ng/mL    Comment:        Interpretation: PCT (Procalcitonin) <= 0.5 ng/mL: Systemic infection (sepsis) is not likely. Local bacterial infection is possible. (NOTE)       Sepsis PCT Algorithm           Lower Respiratory Tract                                      Infection PCT Algorithm    ----------------------------      ----------------------------  PCT < 0.25 ng/mL                PCT < 0.10 ng/mL          Strongly encourage             Strongly discourage   discontinuation of antibiotics    initiation of antibiotics    ----------------------------     -----------------------------       PCT 0.25 - 0.50 ng/mL            PCT 0.10 - 0.25 ng/mL               OR       >80% decrease in PCT            Discourage initiation of                                            antibiotics      Encourage discontinuation           of antibiotics    ----------------------------     -----------------------------         PCT >= 0.50 ng/mL              PCT 0.26 - 0.50 ng/mL               AND        <80% decrease in PCT             Encourage initiation of                                             antibiotics       Encourage continuation           of antibiotics    ----------------------------     -----------------------------        PCT >= 0.50 ng/mL                  PCT > 0.50 ng/mL               AND         increase in PCT                  Strongly encourage                                      initiation of antibiotics    Strongly encourage escalation           of antibiotics                                     -----------------------------                                           PCT <= 0.25 ng/mL  OR                                        > 80% decrease in PCT                                      Discontinue / Do not initiate                                             antibiotics  Performed at [facility], [address], [place], [place] [address]   Brain natriuretic peptide     Status: Abnormal   Collection Time: 11/24/21  4:54 AM  Result Value Ref Range   B Natriuretic Peptide 293.0 (H) 0.0 - 100.0 pg/mL    Comment: Performed at [facility], [address], [place], [place] [address]  Glucose, capillary     Status: Abnormal   Collection Time:  11/24/21  7:30 AM  Result Value Ref Range   Glucose-Capillary 146 (H) 70 - 99 mg/dL    Comment: Glucose reference range applies only to samples taken after fasting for at least 8 hours.  Lactic acid, plasma     Status: None   Collection Time: 11/24/21  7:41 AM  Result Value Ref Range   Lactic Acid, Venous 0.6 0.5 - 1.9 mmol/L    Comment: Performed at [facility], [address], [place], [place] [address]  Glucose, capillary     Status: Abnormal   Collection Time: 11/24/21 11:16 AM  Result Value Ref Range   Glucose-Capillary 185 (H) 70 - 99 mg/dL    Comment: Glucose reference range applies only to samples taken after fasting for at least 8 hours.  Troponin I (High Sensitivity)     Status: Abnormal   Collection Time: 11/24/21 11:35 AM  Result Value Ref Range   Troponin I (High Sensitivity) 95 (H) <18 ng/L    Comment: (NOTE) Elevated high sensitivity troponin I (hsTnI) values and significant  changes across serial measurements may suggest ACS but many other  chronic and acute conditions are known to elevate hsTnI results.  Refer to the "Links" section for chest pain algorithms and additional  guidance. Performed at [facility], [address], [place], [place] [address]   Body fluid cell count with differential     Status: Abnormal   Collection Time: 11/24/21 12:40 PM  Result Value Ref Range   Fluid Type-FCT PLEURAL    Color, Fluid YELLOW (A) YELLOW   Appearance, Fluid HAZY (A) CLEAR   Total Nucleated Cell Count, Fluid 6,234 (H) 0 - 1,000 cu mm   Neutrophil Count, Fluid 63 (H) 0 - 25 %   Lymphs, Fluid 29 %   Monocyte-Macrophage-Serous Fluid 8 (L) 50 - 90 %   Eos, Fluid 0 %   Other Cells, Fluid OTHER CELLS IDENTIFIED AS MESOTHELIAL CELLS %    Comment: Performed at [facility], [address], [place], [place] [address]  Lactate dehydrogenase (pleural or peritoneal fluid)     Status: Abnormal   Collection Time: 11/24/21 12:40 PM  Result Value Ref Range   LD, Fluid 171 (H) 3 - 23  U/L    Comment: (NOTE) Results should be  evaluated in conjunction with serum values    Fluid Type-FLDH PLEURAL     Comment: Performed at [facility], [address] [place], [place] [address] CORRECTED ON 07/24 AT 1257: PREVIOUSLY REPORTED AS Pleural Fld   Gram stain     Status: None   Collection Time: 11/24/21 12:40 PM   Specimen: Pleura  Result Value Ref Range   Specimen Description PLEURAL    Special Requests PLEURAL    Gram Stain      WBC PRESENT,BOTH PMN AND MONONUCLEAR NO ORGANISMS SEEN CYTOSPIN SMEAR Performed at [facility], [address], [place], [place] [address]    Report Status 11/24/2021 FINAL   Culture, body fluid w Gram Stain-bottle     Status: None (Preliminary result)   Collection Time: 11/24/21 12:40 PM   Specimen: Pleura  Result Value Ref Range   Specimen Description PLEURAL    Special Requests      BOTTLES DRAWN AEROBIC AND ANAEROBIC Blood Culture results may not be optimal due to an excessive volume of blood received in culture bottles   Culture      NO GROWTH < 12 HOURS Performed at [facility], [address], [place], [place] [address]    Report Status PENDING   Protein, pleural or peritoneal fluid     Status: None   Collection Time: 11/24/21 12:40 PM  Result Value Ref Range   Total protein, fluid 3.6 g/dL    Comment: (NOTE) No normal range established for this test Results should be evaluated in conjunction with serum values    Fluid Type-FTP PLEURAL     Comment: Performed at [facility], [address], [place], [place] [address]  Glucose, capillary     Status: Abnormal   Collection Time: 11/24/21  4:30 PM  Result Value Ref Range   Glucose-Capillary 285 (H) 70 - 99 mg/dL    Comment: Glucose reference range applies only to samples taken after fasting for at least 8 hours.  Glucose, capillary     Status: Abnormal   Collection Time: 11/24/21  9:27 PM  Result Value Ref Range   Glucose-Capillary 260 (H) 70 - 99 mg/dL    Comment: Glucose  reference range applies only to samples taken after fasting for at least 8 hours.  Basic metabolic panel     Status: Abnormal   Collection Time: 11/25/21  5:52 AM  Result Value Ref Range   Sodium 137 135 - 145 mmol/L   Potassium 3.9 3.5 - 5.1 mmol/L   Chloride 108 98 - 111 mmol/L   CO2 23 22 - 32 mmol/L   Glucose, Bld 164 (H) 70 - 99 mg/dL    Comment: Glucose reference range applies only to samples taken after fasting for at least 8 hours.   BUN 61 (H) 8 - 23 mg/dL   Creatinine, Ser 1.92 (H) 0.61 - 1.24 mg/dL   Calcium 8.3 (L) 8.9 - 10.3 mg/dL   GFR, Estimated 34 (L) >60 mL/min    Comment: (NOTE) Calculated using the CKD-EPI Creatinine Equation (2021)    Anion gap 6 5 - 15    Comment: Performed at [facility], [address], [place], [place] [address]  CBC     Status: Abnormal   Collection Time: 11/25/21  5:52 AM  Result Value Ref Range   WBC 6.5 4.0 - 10.5 K/uL   RBC 2.67 (L) 4.22 - 5.81 MIL/uL   Hemoglobin 8.4 (L) 13.0 - 17.0 g/dL   HCT 25.7 (L) 39.0 - 52.0 %  MCV 96.3 80.0 - 100.0 fL   MCH 31.5 26.0 - 34.0 pg   MCHC 32.7 30.0 - 36.0 g/dL   RDW 13.3 11.5 - 15.5 %   Platelets 209 150 - 400 K/uL   nRBC 0.0 0.0 - 0.2 %    Comment: Performed at [facility], [address] [place], [place] [address]  Brain natriuretic peptide     Status: Abnormal   Collection Time: 11/25/21  5:52 AM  Result Value Ref Range   B Natriuretic Peptide 222.0 (H) 0.0 - 100.0 pg/mL    Comment: Performed at [facility], [address] [place], [place] [address]  Lactate dehydrogenase     Status: None   Collection Time: 11/25/21  5:52 AM  Result Value Ref Range   LDH 130 98 - 192 U/L    Comment: Performed at [facility], [address], [place], [place] [address]  Reticulocytes     Status: Abnormal   Collection Time: 11/25/21  5:52 AM  Result Value Ref Range   Retic Ct Pct 1.2 0.4 - 3.1 %   RBC. 2.72 (L) 4.22 - 5.81 MIL/uL   Retic Count, Absolute 31.3 19.0 - 186.0 K/uL   Immature Retic Fract  15.6 2.3 - 15.9 %    Comment: Performed at [facility], [address] [place], [place] [address]  Ferritin     Status: None   Collection Time: 11/25/21  5:52 AM  Result Value Ref Range   Ferritin 246 24 - 336 ng/mL    Comment: Performed at [facility], [address] [place], [place] [address]  Iron and TIBC     Status: Abnormal   Collection Time: 11/25/21  5:52 AM  Result Value Ref Range   Iron 22 (L) 45 - 182 ug/dL   TIBC 222 (L) 250 - 450 ug/dL   Saturation Ratios 10 (L) 17.9 - 39.5 %   UIBC 200 ug/dL    Comment: Performed at [facility], [address], [place], [place] [address]  Albumin     Status: Abnormal   Collection Time: 11/25/21  5:52 AM  Result Value Ref Range   Albumin 2.9 (L) 3.5 - 5.0 g/dL    Comment: Performed at [facility], [address], [place], [place] [address]  Glucose, capillary     Status: Abnormal   Collection Time: 11/25/21  7:56 AM  Result Value Ref Range   Glucose-Capillary 142 (H) 70 - 99 mg/dL    Comment: Glucose reference range applies only to samples taken after fasting for at least 8 hours.    ECG   N/A  Telemetry   Sinus rhythm with NSVT - Personally Reviewed  Radiology    DG Chest 1 View  Result Date: 11/24/2021 CLINICAL DATA:  RIGHT pleural effusion post thoracentesis EXAM: CHEST  1 VIEW COMPARISON:  11/24/2021 FINDINGS: Enlargement of cardiac silhouette with pulmonary vascular congestion. Markedly decreased RIGHT pleural effusion and basilar atelectasis. Residual small LEFT pleural effusion and basilar atelectasis. Interstitial infiltrate consistent with pulmonary edema/CHF. Atherosclerotic calcification aorta. No pneumothorax following thoracentesis. Osseous demineralization. IMPRESSION: Mild CHF with decreased RIGHT pleural effusion and basilar atelectasis post thoracentesis. No pneumothorax following RIGHT thoracentesis. Electronically Signed   By: Lavonia Dana M.D.   On: 11/24/2021 13:20   US THORACENTESIS ASP PLEURAL SPACE W/IMG  GUIDE  Result Date: 11/24/2021 Lavonia Dana, MD     11/24/2021  1:19 PM PreOperative Dx: RIGHT pleural effusion Postoperative Dx: RIGHT pleural effusion Procedure:   US guided  RIGHT thoracentesis Radiologist:  Thornton Papas Anesthesia:  10 ml of 1% lidocaine Specimen:  1.6 L of sl cloudy yellow colored fluid EBL:   < 1 ml Complications: None    ECHOCARDIOGRAM COMPLETE  Result Date: 11/24/2021    ECHOCARDIOGRAM REPORT   Patient Name:   Bradley Sheppard Date of Exam: 11/24/2021 Medical Rec #:  [id number]        Height:       72.0 in Accession #:    [id number]       Weight:       226.9 lb Date of Birth:  06-24-1940        BSA:          2.248 m Patient Age:    25 years         BP:           141/59 mmHg Patient Gender: M                HR:           62 bpm. Exam Location:  [facility] Procedure: 2D Echo, Cardiac Doppler and Color Doppler Indications:    I33.9 SBE  History:        Patient has prior history of Echocardiogram examinations, most                 recent 07/09/2020. CHF, CAD and Previous Myocardial Infarction,                 Aortic Valve Disease, Signs/Symptoms:Murmur; Risk                 Factors:Hypertension, Dyslipidemia, Former Smoker and Diabetes.                 Hx of CKD.  Sonographer:    Alvino Chapel RCS Referring Phys: [place]  1. Left ventricular ejection fraction, by estimation, is 65 to 70%. The left ventricle has normal function. The left ventricle has no regional wall motion abnormalities. There is moderate concentric left ventricular hypertrophy. Left ventricular diastolic parameters are consistent with Grade II diastolic dysfunction (pseudonormalization). Elevated left ventricular end-diastolic pressure.  2. Right ventricular systolic function is normal. The right ventricular size is normal. Tricuspid regurgitation signal is inadequate for assessing PA pressure.  3. Left atrial size was severely dilated.  4. Right atrial size was moderately dilated.  5. A small pericardial  effusion is present. The pericardial effusion is posterior to the left ventricle and anterior to the right ventricle. There is no evidence of cardiac tamponade.  6. The mitral valve is degenerative. Trivial mitral valve regurgitation.  7. The aortic valve is tricuspid with restricted motion of the left coronary cusp. There is moderate calcification of the aortic valve. Moderate aortic valve stenosis. Aortic valve mean gradient measures 18.0 mmHg. Dimentionless index 0.44.  8. The inferior vena cava is dilated in size with >50% respiratory variability, suggesting right atrial pressure of 8 mmHg. Comparison(s): Prior images reviewed side by side. LVEF vigorous with moderate diastolic dysfunction and increased LV filling pressure. Moderate aortic stenosis, mean gradient has increased from 13 mmHg to 18 mmHg. FINDINGS  Left Ventricle: Left ventricular ejection fraction, by estimation, is 65 to 70%. The left ventricle has normal function. The left ventricle has no regional wall motion abnormalities. The left ventricular internal cavity size was normal in size. There is  moderate concentric left ventricular hypertrophy. Left ventricular diastolic parameters are consistent with Grade II diastolic dysfunction (pseudonormalization). Elevated  left ventricular end-diastolic pressure. Right Ventricle: The right ventricular size is normal. No increase in right ventricular wall thickness. Right ventricular systolic function is normal. Tricuspid regurgitation signal is inadequate for assessing PA pressure. Left Atrium: Left atrial size was severely dilated. Right Atrium: Right atrial size was moderately dilated. Pericardium: A small pericardial effusion is present. The pericardial effusion is posterior to the left ventricle and anterior to the right ventricle. There is no evidence of cardiac tamponade. Mitral Valve: The mitral valve is degenerative in appearance. There is mild thickening of the mitral valve leaflet(s). There is  mild calcification of the mitral valve leaflet(s). Trivial mitral valve regurgitation. Tricuspid Valve: The tricuspid valve is grossly normal. Tricuspid valve regurgitation is trivial. Aortic Valve: The aortic valve is tricuspid. There is moderate calcification of the aortic valve. There is mild to moderate aortic valve annular calcification. Aortic valve regurgitation is not visualized. Moderate aortic stenosis is present. Aortic valve mean gradient measures 18.0 mmHg. Aortic valve peak gradient measures 33.4 mmHg. Aortic valve area, by VTI measures 1.37 cm. Pulmonic Valve: The pulmonic valve was grossly normal. Pulmonic valve regurgitation is trivial. Aorta: The aortic root is normal in size and structure. Venous: The inferior vena cava is dilated in size with greater than 50% respiratory variability, suggesting right atrial pressure of 8 mmHg. IAS/Shunts: No atrial level shunt detected by color flow Doppler.  LEFT VENTRICLE PLAX 2D LVIDd:         4.40 cm   Diastology LVIDs:         2.70 cm   LV e' medial:    7.29 cm/s LV PW:         1.50 cm   LV E/e' medial:  18.9 LV IVS:        1.70 cm   LV e' lateral:   6.74 cm/s LVOT diam:     2.00 cm   LV E/e' lateral: 20.5 LV SV:         92 LV SV Index:   41 LVOT Area:     3.14 cm  RIGHT VENTRICLE RV S prime:     15.80 cm/s TAPSE (M-mode): 2.8 cm LEFT ATRIUM              Index        RIGHT ATRIUM           Index LA diam:        4.30 cm  1.91 cm/m   RA Area:     26.20 cm LA Vol (A2C):   99.3 ml  44.18 ml/m  RA Volume:   90.00 ml  40.04 ml/m LA Vol (A4C):   138.0 ml 61.39 ml/m LA Biplane Vol: 119.0 ml 52.94 ml/m  AORTIC VALVE AV Area (Vmax):    1.33 cm AV Area (Vmean):   1.35 cm AV Area (VTI):     1.37 cm AV Vmax:           289.00 cm/s AV Vmean:          199.000 cm/s AV VTI:            0.669 m AV Peak Grad:      33.4 mmHg AV Mean Grad:      18.0 mmHg LVOT Vmax:         122.33 cm/s LVOT Vmean:        85.400 cm/s LVOT VTI:          0.292 m LVOT/AV VTI ratio: 0.44   AORTA Ao Root  diam: 3.70 cm MITRAL VALVE MV Area (PHT): 3.03 cm     SHUNTS MV Decel Time: 250 msec     Systemic VTI:  0.29 m MV E velocity: 138.00 cm/s  Systemic Diam: 2.00 cm MV A velocity: 77.10 cm/s MV E/A ratio:  1.79 Rozann Lesches MD Electronically signed by Rozann Lesches MD Signature Date/Time: 11/24/2021/11:51:56 AM    Final    DG CHEST PORT 1 VIEW  Result Date: 11/24/2021 CLINICAL DATA:  Worsening shortness of breath with exertion. EXAM: PORTABLE CHEST 1 VIEW COMPARISON:  Chest radiograph 1 day prior FINDINGS: The cardiomediastinal silhouette is stable. Moderate right larger than left pleural effusions are again seen with patchy opacities in the lower lobes, right worse than left. There is vascular congestion and mild pulmonary interstitial edema. Overall, aeration appears slightly worsened in the right lower lobe compared to the study from 1 day prior. There is no pneumothorax The bones are stable. IMPRESSION: Moderate right larger than left pleural effusions with adjacent opacities in the lower lobes, worsened in the right base compared to the study from 1 day prior. Electronically Signed   By: Valetta Mole M.D.   On: 11/24/2021 08:16   CT Angio Chest PE W and/or Wo Contrast  Result Date: 11/23/2021 CLINICAL DATA:  History of MALT lymphoma. Exertional dyspnea. Clinical concern for pulmonary embolus. EXAM: CT ANGIOGRAPHY CHEST WITH CONTRAST TECHNIQUE: Multidetector CT imaging of the chest was performed using the standard protocol during bolus administration of intravenous contrast. Multiplanar CT image reconstructions and MIPs were obtained to evaluate the vascular anatomy. RADIATION DOSE REDUCTION: This exam was performed according to the departmental dose-optimization program which includes automated exposure control, adjustment of the mA and/or kV according to patient size and/or use of iterative reconstruction technique. CONTRAST:  1m OMNIPAQUE IOHEXOL 350 MG/ML SOLN COMPARISON:  PET-CT  08/28/2021 FINDINGS: Cardiovascular: Heart is enlarged. Coronary artery calcification is evident. Mild atherosclerotic calcification is noted in the wall of the thoracic aorta. There is no filling defect within the opacified pulmonary arteries to suggest the presence of an acute pulmonary embolus. Mediastinum/Nodes: No mediastinal lymphadenopathy. There is no hilar lymphadenopathy. The esophagus has normal imaging features. There is no axillary lymphadenopathy. Lungs/Pleura: Fine architectural lung detail obscured by substantial breathing motion during image acquisition. Probable component of underlying emphysema. There is collapse/consolidation in both lower lobes with superimposed patchy bilateral ground-glass opacities. Moderate right and small to moderate left pleural effusions evident. Upper Abdomen: Unremarkable. Musculoskeletal: No worrisome lytic or sclerotic osseous abnormality. Review of the MIP images confirms the above findings. IMPRESSION: 1. No CT evidence for acute pulmonary embolus. 2. Moderate right and small to moderate left pleural effusions with collapse/consolidation in both lower lobes. 3. Patchy bilateral ground-glass opacities, compatible with edema and/or infection. 4. Aortic Atherosclerosis (ICD10-I70.0). Electronically Signed   By: EMisty StanleyM.D.   On: 11/23/2021 08:59    Cardiac Studies   See echo  Assessment   Principal Problem:   SOB (shortness of breath) Active Problems:   Mixed hyperlipidemia   Essential hypertension, benign   CORONARY ATHEROSCLEROSIS NATIVE CORONARY ARTERY   Normochromic normocytic anemia   CKD (chronic kidney disease), stage II   Iron deficiency anemia   MALT lymphoma (HCC)   CHF (congestive heart failure), NYHA class I, acute, diastolic (HCC)   DMII (diabetes mellitus, type 2) (HCC)   Rectal mass   Bilateral pleural effusion  Acute diastolic congestive heart failure  Plan   Breathing is much better today - had 4L  negative yesterday  between urine and thoracentesis output. Creatinine is up - still volume overloaded - will decrease lasix to 40 mg daily x 1 this afternoon and transition to oral lasix 40 mg tomorrow. Echo shows normal LVEF, grade 2 DD and moderate AS - would be at increased risk for surgery, but not prohibitive given his rectal mass. Advised that they proceed as scheduled. Agree with IV iron for fe-deficiency anemia.   Time Spent Directly with Patient:  I have spent a total of 25 minutes with the patient reviewing hospital notes, telemetry, EKGs, labs and examining the patient as well as establishing an assessment and plan that was discussed personally with the patient.  > 50% of time was spent in direct patient care.  Length of Stay:  LOS: 2 days   Pixie Casino, MD, [facility], [place] Director of the [facility]  Cardi[facility] Reduction [facility] Diplomate of the American Board of Clinical Lipidology Attending Cardiologist  Direct Dial: [phone]  Fax: [phone]  Website:  [email or web address] 11/25/2021, 8:43 AM

## 2021-11-25 NOTE — Plan of Care (Signed)
  Problem: Acute Rehab PT Goals(only PT should resolve) Goal: Pt Will Go Supine/Side To Sit Outcome: Progressing Flowsheets (Taken 11/25/2021 1423) Pt will go Supine/Side to Sit: with min guard assist Goal: Pt Will Go Sit To Supine/Side Outcome: Progressing Flowsheets (Taken 11/25/2021 1423) Pt will go Sit to Supine/Side: with min guard assist Goal: Patient Will Transfer Sit To/From Stand Outcome: Progressing Flowsheets (Taken 11/25/2021 1423) Patient will transfer sit to/from stand: with minimal assist Goal: Pt Will Transfer Bed To Chair/Chair To Bed Outcome: Progressing Flowsheets (Taken 11/25/2021 1423) Pt will Transfer Bed to Chair/Chair to Bed: min guard assist Goal: Pt Will Ambulate Outcome: Progressing Flowsheets (Taken 11/25/2021 1423) Pt will Ambulate:  50 feet  with min guard assist  with rolling walker   Pamala Hurry D. Hartnett-Rands, MS, PT Per [place] [phone] 11/25/2021

## 2021-11-25 NOTE — Plan of Care (Signed)
  Problem: Acute Rehab OT Goals (only OT should resolve) Goal: Pt. Will Perform Grooming Flowsheets (Taken 11/25/2021 0958) Pt Will Perform Grooming:  with supervision  standing  with min guard assist Goal: Pt. Will Perform Upper Body Dressing Flowsheets (Taken 11/25/2021 0958) Pt Will Perform Upper Body Dressing:  with modified independence  sitting Goal: Pt. Will Transfer To Toilet Flowsheets (Taken 11/25/2021 [phone]) Pt Will Transfer to Toilet:  with min guard assist  stand pivot transfer Goal: Pt/Caregiver Will Perform Home Exercise Program Flowsheets (Taken 11/25/2021 [phone]) Pt/caregiver will Perform Home Exercise Program:  Increased ROM  Increased strength  Right Upper extremity  Left upper extremity  Independently  Bradley Sheppard OT, MOT

## 2021-11-25 NOTE — Care Management Important Message (Signed)
Important Message  Patient Details  Name: Bradley Sheppard MRN: [id number] Date of Birth: 29-Oct-1940   Medicare Important Message Given:  N/A - LOS <3 / Initial given by admissions     Tommy Medal 11/25/2021, 9:15 AM

## 2021-11-25 NOTE — TOC Initial Note (Signed)
Transition of Care [facility]) - Initial/Assessment Note    Patient Details  Name: Bradley Sheppard MRN: [id number] Date of Birth: 1940/10/16  Transition of Care [facility]) CM/SW Contact:    Ihor Gully, LCSW Phone Number: 11/25/2021, 2:07 PM  Clinical Narrative:                 Patient from home with wife. Uses a walker and cane in the home.Uses a wc in the community. Family takes medica appointments. PCP is with [facility]. Has been advised during this hospitalization to follow a heart healthy diet. Ms. Massenburg states that she will try to ensure that patient follows it.   Expected Discharge Plan: [place] Barriers to Discharge: Continued Medical Work up   Patient Goals and CMS Choice Patient states their goals for this hospitalization and ongoing recovery are:: return home.      Expected Discharge Plan and Services Expected Discharge Plan: [place] arrangements for the past 2 months: Single Family Home                                      Prior Living Arrangements/Services Living arrangements for the past 2 months: Single Family Home Lives with:: Spouse Patient language and need for interpreter reviewed:: Yes        Need for Family Participation in Patient Care: Yes (Comment) Care giver support system in place?: Yes (comment)   Criminal Activity/Legal Involvement Pertinent to Current Situation/Hospitalization: No - Comment as needed  Activities of Daily Living      Permission Sought/Granted Permission sought to share information with : Family Supports    Share Information with NAME: Peter Congo, wife           Emotional Assessment         Alcohol / Substance Use: Not Applicable Psych Involvement: No (comment)  Admission diagnosis:  SOB (shortness of breath) [R06.02] Renal insufficiency [N28.9] Elevated troponin [R77.8] Normochromic normocytic anemia [D64.9] Acute on chronic congestive  heart failure, unspecified heart failure type ([place]) [I50.9] Patient Active Problem List   Diagnosis Date Noted   SOB (shortness of breath) 11/23/2021   CHF (congestive heart failure), NYHA class I, acute, diastolic ([place]) 94/76/5465    Class: Acute   DMII (diabetes mellitus, type 2) ([place]) 11/23/2021   Rectal mass 11/23/2021    Class: Chronic   Bilateral pleural effusion 11/23/2021    Class: Acute   MALT lymphoma ([place]) 09/16/2021   Iron deficiency anemia 07/28/2021   CKD (chronic kidney disease), stage II 07/11/2021   Mixed hyperlipidemia 04/03/2009   Essential hypertension, benign 04/03/2009   CORONARY ATHEROSCLEROSIS NATIVE CORONARY ARTERY 04/03/2009   Aortic stenosis 02/18/2009   PCP:  [name], MD Pharmacy:   [facility], [place] [address] [place] [address] Phone: [phone] Fax: [phone]     Social Determinants of Health (SDOH) Interventions    Readmission Risk Interventions     No data to display

## 2021-11-25 NOTE — Evaluation (Signed)
Physical Therapy Evaluation Patient Details Name: Bradley Sheppard MRN: [id number] DOB: 05-18-1940 Today's Date: 11/25/2021  History of Present Illness  Bradley Sheppard is a 81 year old male with extensive history of DM 2, HTN, HLD,MALT lymphoma, CAD presented to ED with chief complaint of shortness of breath  -Patient reports shortness of breath has been going on for the past week, progressively getting worse especially with exertion.  He denies any chest pain or cough.  Denies of having any recent illnesses  States that he has CAD but has not been diagnosed with CHF.  And he is not O2 dependent. (per MD).   Clinical Impression  Patient sitting in chair with daughter present upon therapist arrival. Patient agreeable to participating in PT evaluation today. Patient requires min/mod assist to complete power up from chair and cues for step sequencing and hand placement with use of RW. Left knee flexion max approximately 40 degrees so staggered stance for sit to/from stand. Patient ambulates with slow, labored cadence with RW and moderate gait deviations. Patient limited by fatigue and patient's fear of his legs giving out on him unexpectedly as they have in the past resulting in 3 falls. Patient required min assist for bilateral lower extremities to be lifted in to bed with supine to sit. Patient on room air throughout session.  Patient would continue to benefit from skilled physical therapy in current environment and next venue to continue return to prior function and increase strength, endurance, balance, coordination, and functional mobility and gait skills.       Recommendations for follow up therapy are one component of a multi-disciplinary discharge planning process, led by the attending physician.  Recommendations may be updated based on patient status, additional functional criteria and insurance authorization.  Follow Up Recommendations Skilled nursing-short term rehab (<3 hours/day)       Assistance Recommended at Discharge Intermittent Supervision/Assistance  Patient can return home with the following  A lot of help with bathing/dressing/bathroom;Assistance with cooking/housework;Assist for transportation;Help with stairs or ramp for entrance;A lot of help with walking and/or transfers    Equipment Recommendations None recommended by PT  Recommendations for Other Services       Functional Status Assessment Patient has had a recent decline in their functional status and demonstrates the ability to make significant improvements in function in a reasonable and predictable amount of time.     Precautions / Restrictions Precautions Precautions: Fall Restrictions Weight Bearing Restrictions: No      Mobility  Bed Mobility   Bed Mobility: Sit to Supine       Sit to supine: Min assist   General bed mobility comments: assist for B LEs    Transfers Overall transfer level: Needs assistance Equipment used: Rolling walker (2 wheels) Transfers: Sit to/from Stand, Bed to chair/wheelchair/BSC Sit to Stand: Mod assist, Min assist   Step pivot transfers: Min assist       General transfer comment: min/mod assist to complete power up from chair; cues for step sequencing and hand placement with use of RW; left knee flexion max approximately 40 degrees so staggered stance for sit to/from stand    Ambulation/Gait Ambulation/Gait assistance: Min guard Gait Distance (Feet): 20 Feet Assistive device: Rolling walker (2 wheels) Gait Pattern/deviations: Step-to pattern, Decreased step length - right, Decreased stride length, Decreased stance time - left, Decreased weight shift to left, Trunk flexed, Wide base of support Gait velocity: decreased     General Gait Details: slow, labored cadence with RW; moderate gait deviations  as noted above; limited by fatigue and patient's fear of his legs giving out on him unexpectedly; on room air throughout  Stairs             Wheelchair Mobility    Modified Rankin (Stroke Patients Only)       Balance Overall balance assessment: Needs assistance Sitting-balance support: Bilateral upper extremity supported, Feet supported Sitting balance-Leahy Scale: Good Sitting balance - Comments: seated EOB   Standing balance support: Bilateral upper extremity supported, During functional activity, Reliant on assistive device for balance Standing balance-Leahy Scale: Poor Standing balance comment: fair/poor using RW         Pertinent Vitals/Pain Pain Assessment Pain Assessment: No/denies pain    Home Living Family/patient expects to be discharged to:: Private residence Living Arrangements: Spouse/significant other Available Help at Discharge: Available 24 hours/day;Family Type of Home: House Home Access: Stairs to enter Entrance Stairs-Rails:  (Post on R side going up) Entrance Stairs-Number of Steps: 1   Home Layout: One level Home Equipment: Conservation officer, nature (2 wheels);Rollator (4 wheels);BSC/3in1;Tub bench;Grab bars - toilet;Transport chair Additional Comments: Pt lives with wife with kids living by. Recently pt has been sleeping in recliner chair.    Prior Function Prior Level of Function : Needs assist;History of Falls (last six months)       Physical Assist : ADLs (physical)   ADLs (physical): Dressing;Toileting;IADLs;Bathing Mobility Comments: [place] ambulation with RW with PRN assistance from spouse. ADLs Comments: Assisted for bathing and dressing. Uses depends at home currently. Able to dress shirt but assist needed for most other areas.     Hand Dominance   Dominant Hand: Right    Extremity/Trunk Assessment   Upper Extremity Assessment Upper Extremity Assessment: Defer to OT evaluation    Lower Extremity Assessment Lower Extremity Assessment: Generalized weakness;LLE deficits/detail LLE Deficits / Details: knee max 40 degrees flexion; s/p 5 TKAs    Cervical / Trunk  Assessment Cervical / Trunk Assessment: Kyphotic  Communication   Communication: HOH  Cognition Arousal/Alertness: Awake/alert Behavior During Therapy: WFL for tasks assessed/performed Overall Cognitive Status: Within Functional Limits for tasks assessed          General Comments      Exercises     Assessment/Plan    PT Assessment Patient needs continued PT services  PT Problem List Decreased strength;Decreased mobility;Decreased activity tolerance;Decreased balance;Decreased knowledge of use of DME       PT Treatment Interventions DME instruction;Therapeutic exercise;Gait training;Balance training;Neuromuscular re-education;Functional mobility training;Cognitive remediation;Therapeutic activities;Patient/family education    PT Goals (Current goals can be found in the Care Plan section)  Acute Rehab PT Goals Patient Stated Goal: Get better and go home. PT Goal Formulation: With patient/family Time For Goal Achievement: 12/09/21 Potential to Achieve Goals: Fair    Frequency Min 3X/week        AM-PAC PT "6 Clicks" Mobility  Outcome Measure Help needed turning from your back to your side while in a flat bed without using bedrails?: A Little Help needed moving from lying on your back to sitting on the side of a flat bed without using bedrails?: A Little Help needed moving to and from a bed to a chair (including a wheelchair)?: A Little Help needed standing up from a chair using your arms (e.g., wheelchair or bedside chair)?: A Lot Help needed to walk in hospital room?: A Little Help needed climbing 3-5 steps with a railing? : A Lot 6 Click Score: 16    End of Session Equipment Utilized During Treatment:  Gait belt Activity Tolerance: Patient tolerated treatment well;Patient limited by fatigue Patient left: in bed;with call bell/phone within reach;with bed alarm set;with family/visitor present Nurse Communication: Mobility status PT Visit Diagnosis: Unsteadiness on feet  (R26.81);Other abnormalities of gait and mobility (R26.89);Repeated falls (R29.6);Muscle weakness (generalized) (M62.81)    Time: 1325-1400 PT Time Calculation (min) (ACUTE ONLY): 35 min   Charges:   PT Evaluation $PT Eval Low Complexity: 1 Low PT Treatments $Therapeutic Activity: 8-22 mins       Floria Raveling. Hartnett-Rands, MS, PT Per [place] [phone]  Pamala Hurry  Hartnett-Rands 11/25/2021, 2:17 PM

## 2021-11-25 NOTE — TOC Progression Note (Signed)
Transition of Care [facility]) - Progression Note    Patient Details  Name: Bradley Sheppard MRN: [id number] Date of Birth: 25-Feb-1941  [facility] CM/SW Contact  Ihor Gully, LCSW Phone Number: 11/25/2021, 3:41 PM  Clinical Narrative:    PT recommends SNF. Discussed recommendation with wife. She plans on speaking with patient prior to making a decision. TOC to follow up with spouse regarding discharge plan.   Expected Discharge Plan: [place] Barriers to Discharge: Continued Medical Work up  Expected Discharge Plan and Services Expected Discharge Plan: [place] arrangements for the past 2 months: Single Family Home                                       Social Determinants of Health (SDOH) Interventions    Readmission Risk Interventions     No data to display

## 2021-11-25 NOTE — Evaluation (Signed)
Occupational Therapy Evaluation Patient Details Name: Bradley Sheppard MRN: [id number] DOB: [birth year] Today's Date: 11/25/2021   History of Present Illness Bradley Sheppard is a 81 year old male with extensive history of DM 2, HTN, HLD,MALT lymphoma, CAD presented to ED with chief complaint of shortness of breath  -Patient reports shortness of breath has been going on for the past week, progressively getting worse especially with exertion.  He denies any chest pain or cough.  Denies of having any recent illnesses  States that he has CAD but has not been diagnosed with CHF.  And he is not O2 dependent. (per MD)   Clinical Impression   Pt agreeable to OT evaluation. Pt assist by spouse at baseline but reportedly able to transfer with RW and PRN assistance. This date pt needed Min A to pull to sit from supine position in bed. Moderate assist needed to boost to stand from EOB with RW. Pt removed from supplemental O2 and was able to stay at 95% SpO2 or above for duration of session. Once standing pt requires less assist but demonstrates very slow labored movement. SNF recommended due to pt fall history and level of assist specifically to boost and transfer from EOB to chair. If pt mobility status improves home health may be appropriate. Pt left in chair with call bell within reach and family present. Pt will benefit from continued OT in the hospital and recommended venue below to increase strength, balance, and endurance for safe ADL's.        Recommendations for follow up therapy are one component of a multi-disciplinary discharge planning process, led by the attending physician.  Recommendations may be updated based on patient status, additional functional criteria and insurance authorization.   Follow Up Recommendations  Skilled nursing-short term rehab (<3 hours/day)    Assistance Recommended at Discharge Frequent or constant Supervision/Assistance  Patient can return home with the following A  lot of help with walking and/or transfers;A lot of help with bathing/dressing/bathroom;Assistance with cooking/housework;Assist for transportation;Help with stairs or ramp for entrance    Functional Status Assessment  Patient has had a recent decline in their functional status and demonstrates the ability to make significant improvements in function in a reasonable and predictable amount of time.  Equipment Recommendations  None recommended by OT    Recommendations for Other Services       Precautions / Restrictions Precautions Precautions: Fall Restrictions Weight Bearing Restrictions: No      Mobility Bed Mobility Overal bed mobility: Needs Assistance Bed Mobility: Supine to Sit     Supine to sit: HOB elevated, Min assist     General bed mobility comments: HOB slightly elevated today. Single hand held assist to pull to sit.    Transfers Overall transfer level: Needs assistance Equipment used: Rolling walker (2 wheels) Transfers: Sit to/from Stand, Bed to chair/wheelchair/BSC Sit to Stand: Mod assist     Step pivot transfers: Min assist, Mod assist     General transfer comment: Moderate assist to boost from EOB with RW. Once in standing pt able to pivot to chair with more min to mod A via labored movement and extended time.      Balance Overall balance assessment: Needs assistance Sitting-balance support: Bilateral upper extremity supported, Feet supported Sitting balance-Leahy Scale: Good Sitting balance - Comments: seated EOB   Standing balance support: Bilateral upper extremity supported, During functional activity, Reliant on assistive device for balance Standing balance-Leahy Scale: Poor Standing balance comment: using RW  ADL either performed or assessed with clinical judgement   ADL Overall ADL's : Needs assistance/impaired     Grooming: Set up;Supervision/safety;Sitting   Upper Body Bathing: Min  guard;Sitting;Set up   Lower Body Bathing: Maximal assistance;Sitting/lateral leans   Upper Body Dressing : Supervision/safety;Set up   Lower Body Dressing: Maximal assistance;Sitting/lateral leans Lower Body Dressing Details (indicate cue type and reason): Assited in donning socks while seated at EOB. Toilet Transfer: Moderate assistance;Stand-pivot;Rolling walker (2 wheels) Toilet Transfer Details (indicate cue type and reason): Simulated via EOB to chair transfer using RW. Mod A for sit to stand and min to mod A for step pivot to chair. Toileting- Clothing Manipulation and Hygiene: Total assistance;Bed level Toileting - Clothing Manipulation Details (indicate cue type and reason): Pt wearing depends at home. [place] today.     Functional mobility during ADLs: Minimal assistance;Moderate assistance;Rolling walker (2 wheels) General ADL Comments: Generally weak with labored movement in standing.     Vision Baseline Vision/History: 1 Wears glasses Ability to See in Adequate Light: 1 Impaired Patient Visual Report: No change from baseline Vision Assessment?: No apparent visual deficits                Pertinent Vitals/Pain Pain Assessment Pain Assessment: Faces Faces Pain Scale: Hurts a little bit Pain Location: R abdominal area; B shoulders) Pain Descriptors / Indicators: Discomfort Pain Intervention(s): Limited activity within patient's tolerance, Monitored during session, Repositioned     Hand Dominance Right   Extremity/Trunk Assessment Upper Extremity Assessment Upper Extremity Assessment: Generalized weakness;LUE deficits/detail (History of arthritis. 3-/5 B shoulder flexion. History of arthritis.) LUE Deficits / Details: P/ROM shoulder flexion ~75% available range.   Lower Extremity Assessment Lower Extremity Assessment: Defer to PT evaluation   Cervical / Trunk Assessment Cervical / Trunk Assessment:  (Wife reports pt has scoliosis.)   Communication  Communication Communication: HOH   Cognition Arousal/Alertness: Awake/alert Behavior During Therapy: WFL for tasks assessed/performed Overall Cognitive Status: Within Functional Limits for tasks assessed                                                        Home Living Family/patient expects to be discharged to:: Private residence Living Arrangements: Spouse/significant other Available Help at Discharge: Available 24 hours/day;Family Type of Home: House Home Access: Stairs to enter [employer] of Steps: 1 Entrance Stairs-Rails:  (Post on R side going up) Home Layout: One level     Bathroom Shower/Tub: Teacher, early years/pre: Standard Bathroom Accessibility: No   Home Equipment: Conservation officer, nature (2 wheels);Rollator (4 wheels);BSC/3in1;Tub bench;Grab bars - toilet;Transport chair   Additional Comments: Pt lives with wife with kids living by. Recently pt has been sleeping in recliner chair.      Prior Functioning/Environment Prior Level of Function : Needs assist;History of Falls (last six months)       Physical Assist : ADLs (physical)   ADLs (physical): Dressing;Toileting;IADLs;Bathing Mobility Comments: [place] ambulation with RW with PRN assitance from spouse. ADLs Comments: Assisted for bathing and dressing. Uses depends at home currently. Able to dress shirt but assist needed for most other areas.        OT Problem List: Decreased strength;Decreased activity tolerance      OT Treatment/Interventions: Self-care/ADL training;Therapeutic exercise;Therapeutic activities;Patient/family education;Balance training;DME and/or AE instruction    OT Goals(Current goals can  be found in the care plan section) Acute Rehab OT Goals Patient Stated Goal: return home OT Goal Formulation: With patient Time For Goal Achievement: 12/09/21 Potential to Achieve Goals: Fair  OT Frequency: Min 2X/week                                    End of Session Equipment Utilized During Treatment: Rolling walker (2 wheels);Gait belt Nurse Communication: Mobility status  Activity Tolerance: Patient tolerated treatment well Patient left: in chair;with call bell/phone within reach;with family/visitor present  OT Visit Diagnosis: Unsteadiness on feet (R26.81);Other abnormalities of gait and mobility (R26.89);History of falling (Z91.81);Muscle weakness (generalized) (M62.81)                Time: 0825-0900 OT Time Calculation (min): 35 min Charges:  OT General Charges $OT Visit: 1 Visit OT Evaluation $OT Eval Low Complexity: 1 Low  Krysta [place]field OT, MOT  Larey Seat 11/25/2021, 9:53 AM

## 2021-11-26 DIAGNOSIS — I509 Heart failure, unspecified: Secondary | ICD-10-CM | POA: Diagnosis not present

## 2021-11-26 DIAGNOSIS — I5033 Acute on chronic diastolic (congestive) heart failure: Secondary | ICD-10-CM

## 2021-11-26 DIAGNOSIS — I5031 Acute diastolic (congestive) heart failure: Secondary | ICD-10-CM | POA: Diagnosis not present

## 2021-11-26 DIAGNOSIS — I2510 Atherosclerotic heart disease of native coronary artery without angina pectoris: Secondary | ICD-10-CM | POA: Diagnosis not present

## 2021-11-26 DIAGNOSIS — J90 Pleural effusion, not elsewhere classified: Secondary | ICD-10-CM | POA: Diagnosis not present

## 2021-11-26 DIAGNOSIS — N1832 Chronic kidney disease, stage 3b: Secondary | ICD-10-CM | POA: Diagnosis not present

## 2021-11-26 DIAGNOSIS — C884 Extranodal marginal zone B-cell lymphoma of mucosa-associated lymphoid tissue [MALT-lymphoma]: Secondary | ICD-10-CM | POA: Diagnosis not present

## 2021-11-26 DIAGNOSIS — R0602 Shortness of breath: Secondary | ICD-10-CM | POA: Diagnosis not present

## 2021-11-26 DIAGNOSIS — I5032 Chronic diastolic (congestive) heart failure: Secondary | ICD-10-CM

## 2021-11-26 LAB — CBC
HCT: 26.7 % — ABNORMAL LOW (ref 39.0–52.0)
Hemoglobin: 8.6 g/dL — ABNORMAL LOW (ref 13.0–17.0)
MCH: 30.9 pg (ref 26.0–34.0)
MCHC: 32.2 g/dL (ref 30.0–36.0)
MCV: 96 fL (ref 80.0–100.0)
Platelets: 210 10*3/uL (ref 150–400)
RBC: 2.78 MIL/uL — ABNORMAL LOW (ref 4.22–5.81)
RDW: 13.1 % (ref 11.5–15.5)
WBC: 6.4 10*3/uL (ref 4.0–10.5)
nRBC: 0 % (ref 0.0–0.2)

## 2021-11-26 LAB — BASIC METABOLIC PANEL
Anion gap: 9 (ref 5–15)
Anion gap: 7 (ref 5–15)
BUN: 67 mg/dL — ABNORMAL HIGH (ref 8–23)
BUN: 70 mg/dL — ABNORMAL HIGH (ref 8–23)
CO2: 22 mmol/L (ref 22–32)
CO2: 24 mmol/L (ref 22–32)
Calcium: 8.3 mg/dL — ABNORMAL LOW (ref 8.9–10.3)
Calcium: 8.3 mg/dL — ABNORMAL LOW (ref 8.9–10.3)
Chloride: 106 mmol/L (ref 98–111)
Chloride: 103 mmol/L (ref 98–111)
Creatinine, Ser: 1.99 mg/dL — ABNORMAL HIGH (ref 0.61–1.24)
Creatinine, Ser: 2.17 mg/dL — ABNORMAL HIGH (ref 0.61–1.24)
GFR, Estimated: 33 mL/min — ABNORMAL LOW (ref >60)
GFR, Estimated: 29 mL/min — ABNORMAL LOW (ref >60)
Glucose, Bld: 149 mg/dL — ABNORMAL HIGH (ref 70–99)
Glucose, Bld: 319 mg/dL — ABNORMAL HIGH (ref 70–99)
Potassium: 3.9 mmol/L (ref 3.5–5.1)
Potassium: 4.3 mmol/L (ref 3.5–5.1)
Sodium: 137 mmol/L (ref 135–145)
Sodium: 134 mmol/L — ABNORMAL LOW (ref 135–145)

## 2021-11-26 LAB — GLUCOSE, CAPILLARY
Glucose-Capillary: 190 mg/dL — ABNORMAL HIGH (ref 70–99)
Glucose-Capillary: 149 mg/dL — ABNORMAL HIGH (ref 70–99)
Glucose-Capillary: 197 mg/dL — ABNORMAL HIGH (ref 70–99)
Glucose-Capillary: 286 mg/dL — ABNORMAL HIGH (ref 70–99)
Glucose-Capillary: 288 mg/dL — ABNORMAL HIGH (ref 70–99)

## 2021-11-26 LAB — BRAIN NATRIURETIC PEPTIDE: B Natriuretic Peptide: 177 pg/mL — ABNORMAL HIGH (ref 0.0–100.0)

## 2021-11-26 LAB — CYTOLOGY - NON PAP

## 2021-11-26 MED ORDER — LIVING BETTER WITH HEART FAILURE BOOK: Freq: Once | Status: AC

## 2021-11-26 NOTE — Progress Notes (Signed)
Occupational Therapy Treatment Patient Details Name: Bradley Sheppard MRN: [id number] DOB: 05-13-1940 Today's Date: 11/26/2021   History of present illness Bradley Sheppard is a 81 year old male with extensive history of DM 2, HTN, HLD,MALT lymphoma, CAD presented to ED with chief complaint of shortness of breath  -Patient reports shortness of breath has been going on for the past week, progressively getting worse especially with exertion.  He denies any chest pain or cough.  Denies of having any recent illnesses  States that he has CAD but has not been diagnosed with CHF.  And he is not O2 dependent.   OT comments  Pt demonstrated improved bed mobility today with Min G assist needed overall. Mod A still needed to boost from EOB but sit to stand from recliner was improved to more min A using RW for a few reps today. Pt continues to demonstrate labored movement and extended time for step pivot to and from chair. Pt reported having a bowel movement while in the chair resulting in being placed back in bed with NT notified and arriving at end of session to assist cleaning up pt. Pt will benefit from continued OT in the hospital and recommended venue below to increase strength, balance, and endurance for safe ADL's.      Recommendations for follow up therapy are one component of a multi-disciplinary discharge planning process, led by the attending physician.  Recommendations may be updated based on patient status, additional functional criteria and insurance authorization.    Follow Up Recommendations  Skilled nursing-short term rehab (<3 hours/day)    Assistance Recommended at Discharge Frequent or constant Supervision/Assistance  Patient can return home with the following  A lot of help with walking and/or transfers;A lot of help with bathing/dressing/bathroom;Assistance with cooking/housework;Assist for transportation;Help with stairs or ramp for entrance   Equipment Recommendations  None  recommended by OT    Recommendations for Other Services      Precautions / Restrictions Precautions Precautions: Fall Restrictions Weight Bearing Restrictions: No       Mobility Bed Mobility   Bed Mobility: Sit to Supine, Supine to Sit     Supine to sit: HOB elevated, Min guard Sit to supine: Min guard   General bed mobility comments: Labored movement to lift B LE into bed.    Transfers Overall transfer level: Needs assistance Equipment used: Rolling walker (2 wheels) Transfers: Sit to/from Stand, Bed to chair/wheelchair/BSC Sit to Stand: Mod assist, Min assist     Step pivot transfers: Min assist     General transfer comment: Mod A to power up form EOB. More min A to power up from recliner for 3 reps today.     Balance Overall balance assessment: Needs assistance Sitting-balance support: Bilateral upper extremity supported, Feet supported Sitting balance-Bradley Sheppard Scale: Good Sitting balance - Comments: seated EOB   Standing balance support: Bilateral upper extremity supported, During functional activity, Reliant on assistive device for balance Standing balance-Bradley Sheppard Scale: Poor Standing balance comment: fair/poor using RW                           ADL either performed or assessed with clinical judgement   ADL                       Lower Body Dressing: Maximal assistance;Bed level Lower Body Dressing Details (indicate cue type and reason): Wife donned socks with pt lifting B LE to assist. Toilet Transfer:  Moderate assistance;Stand-pivot;Rolling walker (2 wheels) Toilet Transfer Details (indicate cue type and reason): Simulated via EOB to chair transfer using RW. Mod A for sit to stand and min to mod A for step pivot to chair.                  Cognition Arousal/Alertness: Awake/alert Behavior During Therapy: WFL for tasks assessed/performed Overall Cognitive Status: Within Functional Limits for tasks assessed                                                              Pertinent Vitals/ Pain       Pain Assessment Pain Assessment: Faces Faces Pain Scale: Hurts a little bit Pain Location: L abdominal area Pain Descriptors / Indicators: Discomfort Pain Intervention(s): Limited activity within patient's tolerance, Monitored during session, Repositioned                                                          Frequency  Min 2X/week        Progress Toward Goals  OT Goals(current goals can now be found in the care plan section)  Progress towards OT goals: Progressing toward goals  Acute Rehab OT Goals Patient Stated Goal: return home OT Goal Formulation: With patient Time For Goal Achievement: 12/09/21 Potential to Achieve Goals: Fair ADL Goals Pt Will Perform Grooming: with supervision;standing;with min guard assist Pt Will Perform Upper Body Dressing: with modified independence;sitting Pt Will Transfer to Toilet: with min guard assist;stand pivot transfer Pt/caregiver will Perform Home Exercise Program: Increased ROM;Increased strength;Right Upper extremity;Left upper extremity;Independently  Plan Discharge plan remains appropriate                                    End of Session Equipment Utilized During Treatment: Rolling walker (2 wheels);Gait belt  OT Visit Diagnosis: Unsteadiness on feet (R26.81);Other abnormalities of gait and mobility (R26.89);History of falling (Z91.81);Muscle weakness (generalized) (M62.81)   Activity Tolerance Patient tolerated treatment well   Patient Left in bed;with family/visitor present;with nursing/sitter in room   Nurse Communication Other (comment) (NT notfied that pt had a bowel movement when in chair and was placed on bed for easier clean up.)        Time: 2423-5361 OT Time Calculation (min): 17 min  Charges: OT General Charges $OT Visit: 1 Visit OT Treatments $Self Care/Home Management : 8-22  mins  Bradley Sheppard OT, MOT  Larey Seat 11/26/2021, 11:02 AM

## 2021-11-26 NOTE — Progress Notes (Signed)
Physical Therapy Treatment Patient Details Name: Bradley Sheppard MRN: [id number] DOB: 06-28-1940 Today's Date: 11/26/2021   History of Present Illness Bradley Sheppard is a 81 year old male with extensive history of DM 2, HTN, HLD,MALT lymphoma, CAD presented to ED with chief complaint of shortness of breath  -Patient reports shortness of breath has been going on for the past week, progressively getting worse especially with exertion.  He denies any chest pain or cough.  Denies of having any recent illnesses  States that he has CAD but has not been diagnosed with CHF.  And he is not O2 dependent.    PT Comments    PT and family does not want to go to SNF.    Recommendations for follow up therapy are one component of a multi-disciplinary discharge planning process, led by the attending physician.  Recommendations may be updated based on patient status, additional functional criteria and insurance authorization.  Follow Up Recommendations  Home health PT     Assistance Recommended at Discharge Intermittent Supervision/Assistance  Patient can return home with the following Assistance with cooking/housework;Assist for transportation;Help with stairs or ramp for entrance;A little help with walking and/or transfers;A little help with bathing/dressing/bathroom   Equipment Recommendations  None recommended by PT    Recommendations for Other Services  OT     Precautions / Restrictions Precautions Precautions: Fall Restrictions Weight Bearing Restrictions: No     Mobility  Bed Mobility Overal bed mobility: Needs Assistance Bed Mobility: Supine to Sit     Supine to sit: Supervision          Transfers Overall transfer level: Needs assistance Equipment used: Rolling walker (2 wheels) Transfers: Sit to/from Stand, Bed to chair/wheelchair/BSC Sit to Stand: Min assist   Step pivot transfers: Min guard            Ambulation/Gait Ambulation/Gait assistance: Min guard Gait  Distance (Feet): 24 Feet Assistive device: Rolling walker (2 wheels) Gait Pattern/deviations: Step-to pattern, Decreased step length - right, Decreased stride length, Decreased stance time - left, Decreased weight shift to left, Trunk flexed, Wide base of support              Cognition Arousal/Alertness: Awake/alert Behavior During Therapy: WFL for tasks assessed/performed Overall Cognitive Status: Within Functional Limits for tasks assessed                                          Exercises General Exercises - Lower Extremity Ankle Circles/Pumps: Both, 10 reps, Seated Long Arc Quad: Both, 10 reps Heel Slides: Seated Hip ABduction/ADduction: Both, 10 reps, Standing Mini-Sqauts: 10 reps        Pertinent Vitals/Pain Pain Assessment Faces Pain Scale: No hurt    Home Living    Lives with wife at home.  Minimal household ambulation with RW                      Prior Function   See above         PT Goals (current goals can now be found in the care plan section) Acute Rehab PT Goals Patient Stated Goal: Get better and go home. PT Goal Formulation: With patient/family Time For Goal Achievement: 12/09/21 Potential to Achieve Goals: Fair Progress towards PT goals: Progressing toward goals    Frequency    Min 3X/week      PT Plan Current plan remains  appropriate    Co-evaluation              AM-PAC PT "6 Clicks" Mobility   Outcome Measure  Help needed turning from your back to your side while in a flat bed without using bedrails?: A Little Help needed moving from lying on your back to sitting on the side of a flat bed without using bedrails?: A Little Help needed moving to and from a bed to a chair (including a wheelchair)?: A Little Help needed standing up from a chair using your arms (e.g., wheelchair or bedside chair)?: A Little Help needed to walk in hospital room?: A Little Help needed climbing 3-5 steps with a railing? : A  Lot 6 Click Score: 17    End of Session Equipment Utilized During Treatment: Gait belt Activity Tolerance: Patient tolerated treatment well;Patient limited by fatigue Patient left: with call bell/phone within reach;with family/visitor present;in chair Nurse Communication: Mobility status PT Visit Diagnosis: Unsteadiness on feet (R26.81);Other abnormalities of gait and mobility (R26.89);Repeated falls (R29.6);Muscle weakness (generalized) (M62.81)     Time: 1600-1640 PT Time Calculation (min) (ACUTE ONLY): 40 min  Charges:  $Gait Training: 8-22 mins $Therapeutic Exercise: 8-22 mins                      Bradley Sheppard, PT CLT [phone]  11/26/2021, 4:47 PM

## 2021-11-26 NOTE — TOC Progression Note (Signed)
Transition of Care [facility]) - Progression Note    Patient Details  Name: Bradley Sheppard MRN: [id number] Date of Birth: [birth year]  Transition of Care [facility]) CM/SW Contact  Ihor Gully, LCSW Phone Number: 11/26/2021, 12:56 PM  Clinical Narrative:    After much discussion with patient and family, they have decided that patient will go home with [facility] this time because he has rectal surgery planned for the end of next month and the physician advised that he may need rehab then. He does not want to use rehab days now and not have them after the surgery. Requests RW, BSC, and [place] services. Marjory Lies with [place] accepts for [facility] RN, PT, OT, aide and [facility] accepts referral for 3n1 and RW./    Expected Discharge Plan: [place] Barriers to Discharge: Continued Medical Work up  Expected Discharge Plan and Services Expected Discharge Plan: [place] arrangements for the past 2 months: [place]                 DME Arranged: 3-N-1, Walker rolling DME Agency: [employer] Date DME Agency Contacted: 11/26/21 Time DME Agency Contacted: [phone] Representative spoke with at DME Agency: [facility] M[facility] HH Arranged: RN, PT, OT, Nurse's Aide [place] Agency: [place] ([place]) Date [place]: 11/26/21 Time [place]: 1254 Representative spoke with at [place]: [place] ([place]) Interventions    Readmission Risk Interventions     No data to display

## 2021-11-26 NOTE — Progress Notes (Signed)
Progress Note  Patient Name: Bradley Sheppard Date of Encounter: 11/26/2021  [facility] Cardiologist: Rozann Lesches, MD   Subjective   Dyspnea and lower extremity edema have improved. No exertional chest pain or palpitations.   Inpatient Medications    Scheduled Meds:  amLODipine  10 mg Oral Daily   aspirin  81 mg Oral Daily   carvedilol  18.75 mg Oral BID WC   docusate sodium  100 mg Oral Daily   ferrous sulfate  325 mg Oral BID WC   furosemide  40 mg Oral Daily   glipiZIDE  10 mg Oral BID WC   heparin  5,000 Units Subcutaneous Q8H   insulin aspart  0-9 Units Subcutaneous TID WC   LORazepam  1 mg Oral QHS   rosuvastatin  20 mg Oral Daily   sodium chloride flush  3 mL Intravenous Q12H   cyanocobalamin  500 mcg Oral Daily   Continuous Infusions:  PRN Meds: acetaminophen **OR** acetaminophen, bisacodyl, hydrALAZINE, HYDROmorphone (DILAUDID) injection, ipratropium, levalbuterol, nitroGLYCERIN, ondansetron **OR** ondansetron (ZOFRAN) IV, oxyCODONE, senna-docusate, sodium phosphate, traZODone   Vital Signs    Vitals:   11/26/21 0439 11/26/21 0855 11/26/21 0857 11/26/21 0857  BP:   (!) 137/59 (!) 137/59  Pulse:  75  75  Resp:      Temp:      TempSrc:      SpO2:    94%  Weight: 100 kg     Height:        Intake/Output Summary (Last 24 hours) at 11/26/2021 0940 Last data filed at 11/26/2021 0434 Gross per 24 hour  Intake 270.14 ml  Output 1900 ml  Net -1629.86 ml      11/26/2021    4:39 AM 11/25/2021    5:00 AM 11/24/2021    4:16 AM  Last 3 Weights  Weight (lbs) 220 lb 7.4 oz 219 lb 9.3 oz 226 lb 13.7 oz  Weight (kg) 100 kg 99.6 kg 102.9 kg      Telemetry    NSR with intermittent sinus bradycardia, HR in 50's to 60's.  - Personally Reviewed  ECG    No new tracings.   Physical Exam   GEN: Appearing in no acute distress.   Neck: No JVD Cardiac: RRR. 2/6 SEM along RUSB.  Respiratory: Mild rales along bases.  GI: Soft, nontender, non-distended   MS: 1+ pitting edema; No deformity. Neuro:  Nonfocal  Psych: Normal affect   Labs    High Sensitivity Troponin:   Recent Labs  Lab 11/23/21 0640 11/24/21 1135  TROPONINIHS 82* 95*     Chemistry Recent Labs  Lab 11/23/21 0640 11/24/21 0453 11/25/21 0552 11/26/21 0602  NA 137 138 137 137  K 4.5 3.9 3.9 3.9  CL 111 109 108 106  CO2 '22 23 23 22  '$ GLUCOSE 220* 161* 164* 149*  BUN 48* 49* 61* 67*  CREATININE 1.65* 1.68* 1.92* 1.99*  CALCIUM 8.6* 8.6* 8.3* 8.3*  MG 2.1  --   --   --   ALBUMIN  --   --  2.9*  --   GFRNONAA 41* 40* 34* 33*  ANIONGAP 4* '6 6 9    '$ Lipids No results for input(s): "CHOL", "TRIG", "HDL", "LABVLDL", "LDLCALC", "CHOLHDL" in the last 168 hours.  Hematology Recent Labs  Lab 11/24/21 0453 11/25/21 0552 11/26/21 0602  WBC 7.0 6.5 6.4  RBC 2.81* 2.67*  2.72* 2.78*  HGB 8.7* 8.4* 8.6*  HCT 27.1* 25.7* 26.7*  MCV 96.4  96.3 96.0  MCH 31.0 31.5 30.9  MCHC 32.1 32.7 32.2  RDW 13.6 13.3 13.1  PLT 227 209 210   Thyroid No results for input(s): "TSH", "FREET4" in the last 168 hours.  BNP Recent Labs  Lab 11/24/21 0454 11/25/21 0552 11/26/21 0602  BNP 293.0* 222.0* 177.0*    DDimer  Recent Labs  Lab 11/23/21 0640  DDIMER 2.40*     Radiology    DG Chest 1 View  Result Date: 11/24/2021 CLINICAL DATA:  RIGHT pleural effusion post thoracentesis EXAM: CHEST  1 VIEW COMPARISON:  11/24/2021 FINDINGS: Enlargement of cardiac silhouette with pulmonary vascular congestion. Markedly decreased RIGHT pleural effusion and basilar atelectasis. Residual small LEFT pleural effusion and basilar atelectasis. Interstitial infiltrate consistent with pulmonary edema/CHF. Atherosclerotic calcification aorta. No pneumothorax following thoracentesis. Osseous demineralization. IMPRESSION: Mild CHF with decreased RIGHT pleural effusion and basilar atelectasis post thoracentesis. No pneumothorax following RIGHT thoracentesis. Electronically Signed   By: Lavonia Dana M.D.    On: 11/24/2021 13:20   US THORACENTESIS ASP PLEURAL SPACE W/IMG GUIDE  Result Date: 11/24/2021 INDICATION: RIGHT pleural effusion EXAM: ULTRASOUND GUIDED DIAGNOSTIC AND THERAPEUTIC RIGHT spine THORACENTESIS MEDICATIONS: None. COMPLICATIONS: None immediate. PROCEDURE: An ultrasound guided thoracentesis was thoroughly discussed with the patient and questions answered. The benefits, risks, alternatives and complications were also discussed. The patient understands and wishes to proceed with the procedure. Written consent was obtained. Ultrasound was performed to localize and mark an adequate pocket of fluid in the right chest. The area was then prepped and draped in the normal sterile fashion. 1% Lidocaine was used for local anesthesia. Under ultrasound guidance a 6 Fr Safe-T-Centesis catheter was introduced. Thoracentesis was performed. The catheter was removed and a dressing applied. FINDINGS: A total of approximately 1.6 L of slightly cloudy yellow RIGHT pleural fluid was removed. Samples were sent to the laboratory as requested by the clinical team. IMPRESSION: Successful ultrasound guided RIGHT thoracentesis yielding 1.6 L of pleural fluid. Electronically Signed   By: Lavonia Dana M.D.   On: 11/24/2021 13:19    Cardiac Studies   Echocardiogram: 11/24/2021 IMPRESSIONS     1. Left ventricular ejection fraction, by estimation, is 65 to 70%. The  left ventricle has normal function. The left ventricle has no regional  wall motion abnormalities. There is moderate concentric left ventricular  hypertrophy. Left ventricular  diastolic parameters are consistent with Grade II diastolic dysfunction  (pseudonormalization). Elevated left ventricular end-diastolic pressure.   2. Right ventricular systolic function is normal. The right ventricular  size is normal. Tricuspid regurgitation signal is inadequate for assessing  PA pressure.   3. Left atrial size was severely dilated.   4. Right atrial size was  moderately dilated.   5. A small pericardial effusion is present. The pericardial effusion is  posterior to the left ventricle and anterior to the right ventricle. There  is no evidence of cardiac tamponade.   6. The mitral valve is degenerative. Trivial mitral valve regurgitation.   7. The aortic valve is tricuspid with restricted motion of the left  coronary cusp. There is moderate calcification of the aortic valve.  Moderate aortic valve stenosis. Aortic valve mean gradient measures 18.0  mmHg. Dimentionless index 0.44.   8. The inferior vena cava is dilated in size with >50% respiratory  variability, suggesting right atrial pressure of 8 mmHg.   Patient Profile     81 y.o. male w/ PMH of CAD (s/p DES to LAD and RCA in 2003, NST in 2014 showing  mild to moderate pero-infarct ischemia), aortic stenosis, HTN, HLD, carotid artery stenosis, Stage 3 CKD, anemia and non-Hodgkin's lymphoma who is currently admitted for an acute CHF exacerbation.   Assessment & Plan   1. Acute HFpEF - BNP was elevated to 308 on admission and CXR showed moderate pleural effusions with CTA being negative for a PE. Echo this admission shows a preserved EF of 65-70% with no regional WMA but noted to have a small pericardial effusion.  - He did undergo a right thoracentesis on 7/24 with 1.6L of fluid removed. Has also responded well to IV Lasix with a recorded net output of -4.1 L thus far and weight at 220 lbs on most recent check. He has been switched to PO Lasix '40mg'$  daily and given he was on Chlorthalidone prior to admission, would remain off Chlorthalidone at discharge to avoid dual-diuretic therapy.   2. CAD - He had prior stents in 2003 and most recent ischemic evaluation was in 2014 as outlined above. Repeat echo this admission shows a preserved EF with no regional WMA. He was previously having chest pain with coughing but no specific angina. No plans for further ischemic testing this admission. - Remains on ASA  '81mg'$  daily, Coreg 18.'75mg'$  BID and Crestor '20mg'$  daily.   3. Aortic Stenosis - Moderate by repeat echo this admission. Continue to follow as an outpatient.   4. HLD - Followed by his PCP as an outpatient. Remains on Crestor '20mg'$  daily.   5. HTN - BP has been stable, at 137/59 on most recent check. Continue Amlodipine and Coreg. Would not further titrate Coreg given HR in the 50's at times. Chlorthalidone has been discontinued given initiation of Lasix.   6. Stage 3 CKD - Baseline creatinine ~ 1.4  - 1.5. Elevated to 1.99 today and he did receive IV Lasix yesterday. Has now been switched to PO dosing. Will need a repeat BMET at follow-up.   7. Anemia/MALT Lymphoma - Hgb at 8.6 today. Hematology following.    In discussion with Dr. Johnsie Cancel, no additional changes from a cardiac perspective at this time. Will arrange for follow-up with Dr. Domenic Polite or an APP prior to his surgery on 12/31/2021 for reassessment.      For questions or updates, please contact [place] Please consult [email or web address] for contact info under        Signed, Erma Heritage, PA-C  11/26/2021, 9:40 AM

## 2021-11-26 NOTE — Consult Note (Signed)
NAME:  Bradley Sheppard, MRN:  [id number], DOB:  1940-09-14, LOS: 3 ADMISSION DATE:  11/23/2021, CONSULTATION DATE:  11/26/2021  REFERRING MD:  Wilber Bihari CHIEF COMPLAINT: Exudative pleural effusion  History of Present Illness:  81 year old man admitted with shortness of breath for 1 week, especially on exertion , chest discomfort with coughing, 93% saturation on room air. Initial labs showed D-dimer 2.4, BNP 308, troponin 82, BUN/creatinine was 48/1.6. 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 Johney Maine Diabetes type 2 Hypertension  Significant Hospital Events: Including procedures, antibiotic start and stop dates in addition to other pertinent events   7/24 right thoracentesis, 1.6 L fluid 7/23 CT angiogram chest moderate right and small to moderate left effusion with patchy bilateral groundglass opacities  Interim History / Subjective:  Sleeping.  Wife at bedside Shortness of breath much improved   Objective   Blood pressure (!) 121/56, pulse 69, temperature 98.7 F (37.1 C), temperature source Oral, resp. rate 18, height 6' (1.829 m), weight 100 kg, SpO2 95 %.        Intake/Output Summary (Last 24 hours) at 11/26/2021 1449 Last data filed at 11/26/2021 1300 Gross per 24 hour  Intake 390.14 ml  Output 1900 ml  Net -1509.86 ml   Filed Weights   11/24/21 0416 11/25/21 0500 11/26/21 0439  Weight: 102.9 kg 99.6 kg 100 kg    Examination: General: Elderly man, resting in bed, no distress HENT: Mild pallor, no icterus, no lymphadenopathy, no JVD Lungs: Decreased breath sounds right  base, clear on left, no accessory muscle use Cardiovascular: S1-S2 regular, no murmur Abdomen: Soft, nontender, no paraspinal megaly Extremities: 1+ edema, no deformity Neuro: Alert, interactive, nonfoca   Labs show normal electrolytes, BNP 177, no leukocytosis, stable anemia  Chest x-ray shows improved bilateral effusions  Resolved Hospital Problem list     Assessment & Plan:  Bilateral pleural effusions right more than left -Appears to be neutrophilic exudate on thoracentesis -Cytology showed reactive mesothelial cells -Pleural fluid cultures negative -?  Pulmonary fibrosis  Unclear etiology , interestingly PET scan 08/2021 did not show any evidence of pleural hypermetabolism , so malignancy seems unlikely , he did have nonspecific subpleural reticulation raising the possibility of underlying fibrosis -At this point I do not think we have enough evidence to subject him to a pleural biopsy.  Would suggest follow-up with serial imaging to see if effusion recurs  HFrEF -Diuresis seems to be limited by renal function  New diagnosis of lymphoma/MALT Rectal polyp?  New primary, surgery is planned  Discussed plan of care with wife at the bedside  Best Practice (right click and "Reselect all SmartList Selections" daily)    Code Status:  full code Last date of multidisciplinary goals of care discussion [`NA]  Labs   CBC: Recent Labs  Lab 11/23/21 0640 11/24/21 0453 11/25/21 0552 11/26/21 0602  WBC 9.5 7.0 6.5 6.4  NEUTROABS 6.6  --   --   --   HGB 9.8* 8.7* 8.4* 8.6*  HCT 30.1* 27.1* 25.7* 26.7*  MCV 96.8 96.4 96.3 96.0  PLT 257 227 209 210  Basic Metabolic Panel: Recent Labs  Lab 11/23/21 0640 11/24/21 0453 11/25/21 0552 11/26/21 0602  NA 137 138 137 137  K 4.5 3.9 3.9 3.9  CL 111 109 108 106  CO2 22 23 23 22   GLUCOSE 220* 161* 164* 149*  BUN 48* 49* 61* 67*  CREATININE 1.65* 1.68* 1.92* 1.99*  CALCIUM 8.6* 8.6* 8.3* 8.3*  MG 2.1  --   --   --   PHOS  3.9  --   --   --    GFR: Estimated Creatinine Clearance: 34.5 mL/min (A) (by C-G formula based on SCr of 1.99 mg/dL (H)). Recent Labs  Lab 11/23/21 0640 11/24/21 0453 11/24/21 0741 11/25/21 0552 11/26/21 0602  PROCALCITON  --  0.20  --   --   --   WBC 9.5 7.0  --  6.5 6.4  LATICACIDVEN  --   --  0.6  --   --     Liver Function Tests: Recent Labs  Lab 11/25/21 0552  ALBUMIN 2.9*   No results for input(s): "LIPASE", "AMYLASE" in the last 168 hours. No results for input(s): "AMMONIA" in the last 168 hours.  ABG No results found for: "PHART", "PCO2ART", "PO2ART", "HCO3", "TCO2", "ACIDBASEDEF", "O2SAT"   Coagulation Profile: Recent Labs  Lab 11/23/21 0640 11/24/21 0453  INR 1.1 1.1    Cardiac Enzymes: No results for input(s): "CKTOTAL", "CKMB", "CKMBINDEX", "TROPONINI" in the last 168 hours.  HbA1C: Hgb A1c MFr Bld  Date/Time Value Ref Range Status  11/23/2021 06:40 AM 7.6 (H) 4.8 - 5.6 % Final    Comment:    (NOTE) Pre diabetes:          5.7%-6.4%  Diabetes:              >6.4%  Glycemic control for   <7.0% adults with diabetes     CBG: Recent Labs  Lab 11/25/21 1725 11/25/21 2109 11/26/21 0059 11/26/21 0732 11/26/21 1205  GLUCAP 186* 173* 190* 149* 197*    Review of Systems:   Shortness of breath Pedal edema Generalized weakness and fatigue  Past Medical History:  He,  has a past medical history of Anxiety, Arthritis, Asthma, Chronic pain, CKD (chronic kidney disease) stage 3, GFR 30-59 ml/min ([place]), Coronary atherosclerosis of native coronary artery, DM2 (diabetes mellitus, type 2) ([place]), Essential hypertension, Gastritis, GERD (gastroesophageal reflux disease), Iron deficiency anemia, Iron deficiency anemia (07/28/2021), Mitral valve regurgitation, Mixed hyperlipidemia, Myocardial infarction ([place]), and Nephrolithiasis.   Surgical History:   Past Surgical History:  Procedure Laterality Date   APPENDECTOMY     BIOPSY  09/25/2021   Procedure:  BIOPSY;  Surgeon: Daneil Dolin, MD;  Location: [facility];  Service: Endoscopy;;   CARDIAC CATHETERIZATION  01/2002   90% obstruction in proximal LAD, 60 % obstruction in proximal circumflex and 70%  in proximal RCA. LAD & RCA stented  stents x 2   COLONOSCOPY WITH PROPOFOL N/A 09/25/2021   Procedure: COLONOSCOPY WITH PROPOFOL;  Surgeon: Daneil Dolin, MD;  Location: [facility];  Service: Endoscopy;  Laterality: N/A;  11:00am   L knee replacement  2009   Subsequent infectino requiring resectino arthroplasty with incision and drainage 8/10, and reimplantizathion arthroplasty, 9/20. 5 total surgeries.     Social History:   reports that he has quit smoking. His smoking use included cigarettes. His smokeless tobacco use includes chew. He reports that he does not drink alcohol and does not use drugs.   Family History:  His family history  includes Aneurysm in his mother; Diabetes in his mother; Stroke in his father and another family member. There is no history of Colon cancer.   Allergies Allergies  Allergen Reactions   Prednisone Other (See Comments)    "makes him feel faint"     Home Medications  Prior to Admission medications   Medication Sig Start Date End Date Taking? Authorizing Provider  acetaminophen (TYLENOL) 500 MG tablet Take 500 mg by mouth every 6 (six) hours as needed.   Yes [provider]  amLODipine (NORVASC) 10 MG tablet Take 10 mg by mouth daily. 06/13/21  Yes [provider]  aspirin 81 MG tablet Take 81 mg by mouth daily.   Yes [provider]  carvedilol (COREG) 12.5 MG tablet Take 18.75 mg by mouth 2 (two) times daily. 12/19/20  Yes [provider]  chlorthalidone (HYGROTON) 25 MG tablet Take 25 mg by mouth daily.   Yes [provider]  glipiZIDE (GLUCOTROL XL) 10 MG 24 hr tablet Take 10 mg by mouth 2 (two) times daily. 12/19/20  Yes [provider]  hydrALAZINE (APRESOLINE) 25 MG tablet Take 1 tablet (25 mg  total) by mouth 3 (three) times daily. 10/06/21 01/04/22 Yes Monge, Helane Gunther, NP  IRON PO Take 1 tablet by mouth 2 (two) times daily.   Yes [provider]  LORazepam (ATIVAN) 1 MG tablet Take 1 mg by mouth at bedtime.   Yes [provider]  metFORMIN (GLUCOPHAGE) 500 MG tablet Take 500-1,000 mg by mouth See admin instructions. Take 1000 mg in the morning and 500 mg at night 08/16/21  Yes [provider]  nitroGLYCERIN (NITROSTAT) 0.4 MG SL tablet Place 1 tablet (0.4 mg total) under the tongue every 5 (five) minutes as needed. 06/30/21  Yes Satira Sark, MD  pioglitazone (ACTOS) 45 MG tablet Take 45 mg by mouth daily.   Yes [provider]  ramipril (ALTACE) 10 MG capsule Take 10 mg by mouth 2 (two) times daily.   Yes [provider]  rosuvastatin (CRESTOR) 20 MG tablet Take 20 mg by mouth daily.   Yes [provider]  tiZANidine (ZANAFLEX) 2 MG tablet Take 2 mg by mouth 2 (two) times daily.   Yes [provider]  valsartan (DIOVAN) 320 MG tablet Take 320 mg by mouth daily. 12/19/20  Yes [provider]  vitamin B-12 (CYANOCOBALAMIN) 500 MCG tablet Take 500 mcg by mouth daily.   Yes [provider]  vitamin C (ASCORBIC ACID) 500 MG tablet Take 500 mg by mouth daily.   Yes [provider]  polyethylene glycol-electrolytes (TRILYTE) 420 g solution Take 4,000 mLs by mouth as directed. Patient not taking: Reported on 11/23/2021 09/17/21   Daneil Dolin, MD  Semaglutide (OZEMPIC, 0.25 OR 0.5 MG/DOSE, [place]) Inject 0.25 mLs into the skin every Wednesday. Patient not taking: Reported on 11/23/2021    [provider]     Kara Mead MD. FCCP. [facility] Pager : [phone]  If no response to pager , please call [phone] until 7 pm After 7:00 pm call Elink  [phone]   11/26/2021

## 2021-11-26 NOTE — Progress Notes (Signed)
PROGRESS NOTE  Bradley Sheppard NID:[id number] DOB: 19-Apr-1941 DOA: 11/23/2021 PCP: [name], MD  Brief History:  Bradley Sheppard is a 81 year old male with extensive history of DM 2, HTN, HLD,MALT lymphoma, CAD presented to ED with chief complaint of shortness of breath -Patient reports shortness of breath has been going on for the past week, progressively getting worse especially with exertion. He denies any chest pain or cough.  Denies of having any recent illnesses States that he has CAD but has not been diagnosed with CHF.  And he is not O2 dependent.    ED course: Blood pressure 133/71, pulse 70, temperature 98.2 F (36.8 C), temperature source Oral, resp. rate 20, height 6' (1.829 m), weight 99.8 kg, SpO2 97 % on 2 L  CBC WBC 9.5, hemoglobin 9.8, CMP within normal limits with exception of BUN 48, creatinine 1.65, calcium 8.6, troponin 82 glucose 220 BNP 308, Total iron 44 D-dimer 2.4,  Chest x-ray: Moderate congestive heart failure CTA: 1. No CT evidence for acute pulmonary embolus. 2. Moderate right and small to moderate left pleural effusions with collapse/consolidation in both lower lobes. 3. Patchy bilateral ground-glass opacities, compatible with edema and/or infection. 4. Aortic Atherosclerosis     Assessment/Plan: Acute HFpEF - BNP was elevated to 308 on admission and CXR showed moderate pleural effusions with CTA being negative for a PE. 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 -Dr. 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 -Family requested Dr. Raliegh Ip to be consulted -appreciate input  HTN -continue coreg, amlodipine  HLD - Followed by his PCP as an outpatient. Remains on Crestor '20mg'$  daily.  CAD - prior stents in 2003 and most recent ischemic evaluation was in 2014 as outlined above. Repeat echo this admission shows a preserved EF with no regional WMA. He was previously having chest pain with coughing but no specific angina. No plans for further ischemic testing this admission. - Remains on ASA '81mg'$  daily, Coreg 18.'75mg'$  BID and Crestor '20mg'$  daily.   Aortic Stenosis - Moderate by repeat echo this admission. Continue to follow as an outpatient.       Family Communication:   spouse updated at bedside 7/26  Consultants:  med/onc, cardiology, pulm  Code Status:  FULL / DNR  DVT Prophylaxis:  Hopkins Heparin   Procedures: As Listed in Progress Note Above  Antibiotics: None      Subjective: Complains of generalized weakness.  Denies f/,c cp, n/v/d, abd pain  Objective: Vitals:   11/26/21 0855 11/26/21 0857 11/26/21 0857 11/26/21 1314  BP:  (!) 137/59 (!) 137/59 (!) 121/56  Pulse: 75  75 69  Resp:    18  Temp:    98.7 F (37.1 C)  TempSrc:    Oral  SpO2:   94% 95%  Weight:      Height:        Intake/Output Summary (Last 24 hours) at 11/26/2021 1438 Last data filed at 11/26/2021 1300 Gross per 24 hour  Intake 390.14 ml  Output 1900 ml  Net -1509.86 ml   Weight change: 0.4 kg Exam:  General:  Pt is alert, follows commands appropriately, not in acute distress HEENT: No icterus, No thrush, No neck mass,  [place]/AT Cardiovascular: RRR, S1/S2, no rubs, no gallops Respiratory: bibasilar crackles. No wheeze Abdomen: Soft/+BS, non tender, non distended, no guarding Extremities: 1  +LE  edema, No lymphangitis, No petechiae, No rashes, no synovitis   Data Reviewed: I have personally reviewed following labs and imaging studies Basic Metabolic Panel: Recent Labs  Lab 11/23/21 0640 11/24/21 0453 11/25/21 0552 11/26/21 0602  NA 137 138 137 137  K 4.5 3.9 3.9 3.9  CL 111 109 108 106  CO2 '22 23 23 22  '$ GLUCOSE 220* 161* 164* 149*  BUN 48* 49* 61* 67*  CREATININE 1.65* 1.68* 1.92* 1.99*  CALCIUM 8.6* 8.6* 8.3* 8.3*  MG 2.1  --   --   --   PHOS 3.9  --   --   --    Liver Function Tests: Recent Labs  Lab 11/25/21 0552  ALBUMIN 2.9*   No results for input(s): "LIPASE", "AMYLASE" in the last 168 hours. No results for input(s): "AMMONIA" in the last 168 hours. Coagulation Profile: Recent Labs  Lab 11/23/21 0640 11/24/21 0453  INR 1.1 1.1   CBC: Recent Labs  Lab 11/23/21 0640 11/24/21 0453 11/25/21 0552 11/26/21 0602  WBC 9.5 7.0 6.5 6.4  NEUTROABS 6.6  --   --   --   HGB 9.8* 8.7* 8.4* 8.6*  HCT 30.1* 27.1* 25.7* 26.7*  MCV 96.8 96.4 96.3 96.0  PLT 257 227 209 210   Cardiac Enzymes: No results for input(s): "CKTOTAL", "CKMB", "CKMBINDEX", "TROPONINI" in the last 168 hours. BNP: Invalid input(s): "POCBNP" CBG: Recent Labs  Lab 11/25/21 1725 11/25/21 2109 11/26/21 0059 11/26/21 0732 11/26/21 1205  GLUCAP 186* 173* 190* 149* 197*   HbA1C: No results for input(s): "HGBA1C" in the last 72 hours. Urine analysis:    Component Value Date/Time   COLORURINE YELLOW 12/26/2008 0950   APPEARANCEUR CLEAR 12/26/2008 0950   LABSPEC 1.016 12/26/2008 0950   PHURINE 5.5 12/26/2008 0950   GLUCOSEU NEGATIVE 12/26/2008 0950   HGBUR NEGATIVE 12/26/2008 0950   BILIRUBINUR NEGATIVE 12/26/2008 0950   KETONESUR NEGATIVE 12/26/2008 0950   PROTEINUR NEGATIVE 12/26/2008 0950    UROBILINOGEN 0.2 12/26/2008 0950   NITRITE NEGATIVE 12/26/2008 0950   LEUKOCYTESUR  12/26/2008 0950    NEGATIVE MICROSCOPIC NOT DONE ON URINES WITH NEGATIVE PROTEIN, BLOOD, LEUKOCYTES, NITRITE, OR GLUCOSE <1000 mg/dL.   Sepsis Labs: '@LABRCNTIP'$ (procalcitonin:4,lacticidven:4) ) Recent Results (from the past 240 hour(s))  Gram stain     Status: None   Collection Time: 11/24/21 12:40 PM   Specimen: Pleura  Result Value Ref Range Status   Specimen Description PLEURAL  Final   Special Requests PLEURAL  Final   Gram Stain   Final    WBC PRESENT,BOTH PMN AND MONONUCLEAR NO ORGANISMS SEEN CYTOSPIN SMEAR Performed at [facility], [address] [place], [place] [address]    Report Status 11/24/2021 FINAL  Final  Culture, body fluid w Gram Stain-bottle     Status: None (Preliminary result)   Collection Time: 11/24/21 12:40 PM  Specimen: Pleura  Result Value Ref Range Status   Specimen Description PLEURAL  Final   Special Requests   Final    BOTTLES DRAWN AEROBIC AND ANAEROBIC Blood Culture results may not be optimal due to an excessive volume of blood received in culture bottles   Culture   Final    NO GROWTH 2 DAYS Performed at [facility], [address], [place], [place] [address]    Report Status PENDING  Incomplete     Scheduled Meds:  amLODipine  10 mg Oral Daily   aspirin  81 mg Oral Daily   carvedilol  18.75 mg Oral BID WC   docusate sodium  100 mg Oral Daily   ferrous sulfate  325 mg Oral BID WC   furosemide  40 mg Oral Daily   glipiZIDE  10 mg Oral BID WC   heparin  5,000 Units Subcutaneous Q8H   insulin aspart  0-9 Units Subcutaneous TID WC   LORazepam  1 mg Oral QHS   rosuvastatin  20 mg Oral Daily   sodium chloride flush  3 mL Intravenous Q12H   cyanocobalamin  500 mcg Oral Daily   Continuous Infusions:  Procedures/Studies: DG Chest 1 View  Result Date: 11/24/2021 CLINICAL DATA:  RIGHT pleural effusion post thoracentesis EXAM: CHEST  1 VIEW COMPARISON:   11/24/2021 FINDINGS: Enlargement of cardiac silhouette with pulmonary vascular congestion. Markedly decreased RIGHT pleural effusion and basilar atelectasis. Residual small LEFT pleural effusion and basilar atelectasis. Interstitial infiltrate consistent with pulmonary edema/CHF. Atherosclerotic calcification aorta. No pneumothorax following thoracentesis. Osseous demineralization. IMPRESSION: Mild CHF with decreased RIGHT pleural effusion and basilar atelectasis post thoracentesis. No pneumothorax following RIGHT thoracentesis. Electronically Signed   By: Lavonia Dana M.D.   On: 11/24/2021 13:20   US THORACENTESIS ASP PLEURAL SPACE W/IMG GUIDE  Result Date: 11/24/2021 INDICATION: RIGHT pleural effusion EXAM: ULTRASOUND GUIDED DIAGNOSTIC AND THERAPEUTIC RIGHT spine THORACENTESIS MEDICATIONS: None. COMPLICATIONS: None immediate. PROCEDURE: An ultrasound guided thoracentesis was thoroughly discussed with the patient and questions answered. The benefits, risks, alternatives and complications were also discussed. The patient understands and wishes to proceed with the procedure. Written consent was obtained. Ultrasound was performed to localize and mark an adequate pocket of fluid in the right chest. The area was then prepped and draped in the normal sterile fashion. 1% Lidocaine was used for local anesthesia. Under ultrasound guidance a 6 Fr Safe-T-Centesis catheter was introduced. Thoracentesis was performed. The catheter was removed and a dressing applied. FINDINGS: A total of approximately 1.6 L of slightly cloudy yellow RIGHT pleural fluid was removed. Samples were sent to the laboratory as requested by the clinical team. IMPRESSION: Successful ultrasound guided RIGHT thoracentesis yielding 1.6 L of pleural fluid. Electronically Signed   By: Lavonia Dana M.D.   On: 11/24/2021 13:19   ECHOCARDIOGRAM COMPLETE  Result Date: 11/24/2021    ECHOCARDIOGRAM REPORT   Patient Name:   Bradley Sheppard Date of Exam:  11/24/2021 Medical Rec #:  [id number]        Height:       72.0 in Accession #:    [id number]       Weight:       226.9 lb Date of Birth:  07-26-1940        BSA:          2.248 m Patient Age:    20 years         BP:           141/59 mmHg  Patient Gender: M                HR:           62 bpm. Exam Location:  [facility] Procedure: 2D Echo, Cardiac Doppler and Color Doppler Indications:    I33.9 SBE  History:        Patient has prior history of Echocardiogram examinations, most                 recent 07/09/2020. CHF, CAD and Previous Myocardial Infarction,                 Aortic Valve Disease, Signs/Symptoms:Murmur; Risk                 Factors:Hypertension, Dyslipidemia, Former Smoker and Diabetes.                 Hx of CKD.  Sonographer:    Alvino Chapel RCS Referring Phys: [place]  1. Left ventricular ejection fraction, by estimation, is 65 to 70%. The left ventricle has normal function. The left ventricle has no regional wall motion abnormalities. There is moderate concentric left ventricular hypertrophy. Left ventricular diastolic parameters are consistent with Grade II diastolic dysfunction (pseudonormalization). Elevated left ventricular end-diastolic pressure.  2. Right ventricular systolic function is normal. The right ventricular size is normal. Tricuspid regurgitation signal is inadequate for assessing PA pressure.  3. Left atrial size was severely dilated.  4. Right atrial size was moderately dilated.  5. A small pericardial effusion is present. The pericardial effusion is posterior to the left ventricle and anterior to the right ventricle. There is no evidence of cardiac tamponade.  6. The mitral valve is degenerative. Trivial mitral valve regurgitation.  7. The aortic valve is tricuspid with restricted motion of the left coronary cusp. There is moderate calcification of the aortic valve. Moderate aortic valve stenosis. Aortic valve mean gradient measures 18.0 mmHg. Dimentionless  index 0.44.  8. The inferior vena cava is dilated in size with >50% respiratory variability, suggesting right atrial pressure of 8 mmHg. Comparison(s): Prior images reviewed side by side. LVEF vigorous with moderate diastolic dysfunction and increased LV filling pressure. Moderate aortic stenosis, mean gradient has increased from 13 mmHg to 18 mmHg. FINDINGS  Left Ventricle: Left ventricular ejection fraction, by estimation, is 65 to 70%. The left ventricle has normal function. The left ventricle has no regional wall motion abnormalities. The left ventricular internal cavity size was normal in size. There is  moderate concentric left ventricular hypertrophy. Left ventricular diastolic parameters are consistent with Grade II diastolic dysfunction (pseudonormalization). Elevated left ventricular end-diastolic pressure. Right Ventricle: The right ventricular size is normal. No increase in right ventricular wall thickness. Right ventricular systolic function is normal. Tricuspid regurgitation signal is inadequate for assessing PA pressure. Left Atrium: Left atrial size was severely dilated. Right Atrium: Right atrial size was moderately dilated. Pericardium: A small pericardial effusion is present. The pericardial effusion is posterior to the left ventricle and anterior to the right ventricle. There is no evidence of cardiac tamponade. Mitral Valve: The mitral valve is degenerative in appearance. There is mild thickening of the mitral valve leaflet(s). There is mild calcification of the mitral valve leaflet(s). Trivial mitral valve regurgitation. Tricuspid Valve: The tricuspid valve is grossly normal. Tricuspid valve regurgitation is trivial. Aortic Valve: The aortic valve is tricuspid. There is moderate calcification of the aortic valve. There is mild to moderate aortic valve annular calcification. Aortic valve regurgitation is  not visualized. Moderate aortic stenosis is present. Aortic valve mean gradient measures  18.0 mmHg. Aortic valve peak gradient measures 33.4 mmHg. Aortic valve area, by VTI measures 1.37 cm. Pulmonic Valve: The pulmonic valve was grossly normal. Pulmonic valve regurgitation is trivial. Aorta: The aortic root is normal in size and structure. Venous: The inferior vena cava is dilated in size with greater than 50% respiratory variability, suggesting right atrial pressure of 8 mmHg. IAS/Shunts: No atrial level shunt detected by color flow Doppler.  LEFT VENTRICLE PLAX 2D LVIDd:         4.40 cm   Diastology LVIDs:         2.70 cm   LV e' medial:    7.29 cm/s LV PW:         1.50 cm   LV E/e' medial:  18.9 LV IVS:        1.70 cm   LV e' lateral:   6.74 cm/s LVOT diam:     2.00 cm   LV E/e' lateral: 20.5 LV SV:         92 LV SV Index:   41 LVOT Area:     3.14 cm  RIGHT VENTRICLE RV S prime:     15.80 cm/s TAPSE (M-mode): 2.8 cm LEFT ATRIUM              Index        RIGHT ATRIUM           Index LA diam:        4.30 cm  1.91 cm/m   RA Area:     26.20 cm LA Vol (A2C):   99.3 ml  44.18 ml/m  RA Volume:   90.00 ml  40.04 ml/m LA Vol (A4C):   138.0 ml 61.39 ml/m LA Biplane Vol: 119.0 ml 52.94 ml/m  AORTIC VALVE AV Area (Vmax):    1.33 cm AV Area (Vmean):   1.35 cm AV Area (VTI):     1.37 cm AV Vmax:           289.00 cm/s AV Vmean:          199.000 cm/s AV VTI:            0.669 m AV Peak Grad:      33.4 mmHg AV Mean Grad:      18.0 mmHg LVOT Vmax:         122.33 cm/s LVOT Vmean:        85.400 cm/s LVOT VTI:          0.292 m LVOT/AV VTI ratio: 0.44  AORTA Ao Root diam: 3.70 cm MITRAL VALVE MV Area (PHT): 3.03 cm     SHUNTS MV Decel Time: 250 msec     Systemic VTI:  0.29 m MV E velocity: 138.00 cm/s  Systemic Diam: 2.00 cm MV A velocity: 77.10 cm/s MV E/A ratio:  1.79 Rozann Lesches MD Electronically signed by Rozann Lesches MD Signature Date/Time: 11/24/2021/11:51:56 AM    Final    DG CHEST PORT 1 VIEW  Result Date: 11/24/2021 CLINICAL DATA:  Worsening shortness of breath with exertion. EXAM: PORTABLE  CHEST 1 VIEW COMPARISON:  Chest radiograph 1 day prior FINDINGS: The cardiomediastinal silhouette is stable. Moderate right larger than left pleural effusions are again seen with patchy opacities in the lower lobes, right worse than left. There is vascular congestion and mild pulmonary interstitial edema. Overall, aeration appears slightly worsened in the right lower lobe compared to the study from 1  day prior. There is no pneumothorax The bones are stable. IMPRESSION: Moderate right larger than left pleural effusions with adjacent opacities in the lower lobes, worsened in the right base compared to the study from 1 day prior. Electronically Signed   By: Valetta Mole M.D.   On: 11/24/2021 08:16   CT Angio Chest PE W and/or Wo Contrast  Result Date: 11/23/2021 CLINICAL DATA:  History of MALT lymphoma. Exertional dyspnea. Clinical concern for pulmonary embolus. EXAM: CT ANGIOGRAPHY CHEST WITH CONTRAST TECHNIQUE: Multidetector CT imaging of the chest was performed using the standard protocol during bolus administration of intravenous contrast. Multiplanar CT image reconstructions and MIPs were obtained to evaluate the vascular anatomy. RADIATION DOSE REDUCTION: This exam was performed according to the departmental dose-optimization program which includes automated exposure control, adjustment of the mA and/or kV according to patient size and/or use of iterative reconstruction technique. CONTRAST:  30m OMNIPAQUE IOHEXOL 350 MG/ML SOLN COMPARISON:  PET-CT 08/28/2021 FINDINGS: Cardiovascular: Heart is enlarged. Coronary artery calcification is evident. Mild atherosclerotic calcification is noted in the wall of the thoracic aorta. There is no filling defect within the opacified pulmonary arteries to suggest the presence of an acute pulmonary embolus. Mediastinum/Nodes: No mediastinal lymphadenopathy. There is no hilar lymphadenopathy. The esophagus has normal imaging features. There is no axillary lymphadenopathy.  Lungs/Pleura: Fine architectural lung detail obscured by substantial breathing motion during image acquisition. Probable component of underlying emphysema. There is collapse/consolidation in both lower lobes with superimposed patchy bilateral ground-glass opacities. Moderate right and small to moderate left pleural effusions evident. Upper Abdomen: Unremarkable. Musculoskeletal: No worrisome lytic or sclerotic osseous abnormality. Review of the MIP images confirms the above findings. IMPRESSION: 1. No CT evidence for acute pulmonary embolus. 2. Moderate right and small to moderate left pleural effusions with collapse/consolidation in both lower lobes. 3. Patchy bilateral ground-glass opacities, compatible with edema and/or infection. 4. Aortic Atherosclerosis (ICD10-I70.0). Electronically Signed   By: EMisty StanleyM.D.   On: 11/23/2021 08:59   DG Chest 2 View  Result Date: 11/23/2021 CLINICAL DATA:  Shortness of breath. EXAM: CHEST - 2 VIEW COMPARISON:  11/23/2021 FINDINGS: Stable cardiac enlargement. Aortic atherosclerotic calcifications. Small bilateral pleural effusions noted, right greater than left. Mild diffuse interstitial edema. No airspace consolidation. IMPRESSION: 1. Moderate congestive heart failure. Electronically Signed   By: TKerby MoorsM.D.   On: 11/23/2021 07:08   MR PELVIS WO CM RECTAL CA STAGING  Result Date: 11/10/2021 CLINICAL DATA:  Rectal carcinoma.  Local staging. EXAM: MRI PELVIS WITHOUT CONTRAST TECHNIQUE: Multiplanar multisequence MR imaging of the pelvis was performed. No intravenous contrast was administered. Rectal ultrasound gel could not be administered as it was not tolerated by the patient. COMPARISON:  None Available. FINDINGS: TUMOR LOCATION Evaluation limited by lack of rectal distention by ultrasound gel. Tumor distance from Anal Verge/Skin Surface:  3.0 cm Tumor distance to Internal Anal Sphincter: 0 cm; tumor does contact the internal sphincter TUMOR DESCRIPTION  Circumferential Extent: 100% Tumor Length: 3.0 cm T - CATEGORY Extension through Muscularis Propria: No= T1/T2 Shortest Distance of any tumor/node from Mesorectal Fascia: N/a Extramural Vascular Invasion/Tumor Thrombus: No Invasion of Anterior Peritoneal Reflection: No Involvement of Adjacent Organs or Pelvic Sidewall: No Levator Ani Involvement: No N - CATEGORY Mesorectal Lymph Nodes >=561m None=N0 Extra-mesorectal Lymphadenopathy: 2 mildly enlarged left external iliac lymph nodes are seen which measure 1.3 cm and 1.6 cm in maximum diameter. Other:  Sigmoid diverticulosis, without evidence of diverticulitis. 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 Distance from tumor to the internal anal sphincter is 0 cm. Electronically Signed   By: Marlaine Hind M.D.   On: 11/10/2021 09:22    Orson Eva, DO  [facility]  If 7PM-7AM, please contact night-coverage [email or web address] Password [facility] 11/26/2021, 2:38 PM   LOS: 3 days

## 2021-11-27 ENCOUNTER — Inpatient Hospital Stay (HOSPITAL_COMMUNITY): Payer: PPO

## 2021-11-27 ENCOUNTER — Encounter (HOSPITAL_COMMUNITY): Payer: Self-pay | Admitting: Family Medicine

## 2021-11-27 ENCOUNTER — Other Ambulatory Visit: Payer: Self-pay

## 2021-11-27 DIAGNOSIS — D508 Other iron deficiency anemias: Secondary | ICD-10-CM

## 2021-11-27 DIAGNOSIS — I2510 Atherosclerotic heart disease of native coronary artery without angina pectoris: Secondary | ICD-10-CM | POA: Diagnosis not present

## 2021-11-27 DIAGNOSIS — J90 Pleural effusion, not elsewhere classified: Secondary | ICD-10-CM

## 2021-11-27 DIAGNOSIS — I5031 Acute diastolic (congestive) heart failure: Secondary | ICD-10-CM | POA: Diagnosis not present

## 2021-11-27 DIAGNOSIS — N182 Chronic kidney disease, stage 2 (mild): Secondary | ICD-10-CM | POA: Diagnosis not present

## 2021-11-27 LAB — GRAM STAIN: Gram Stain: NONE SEEN

## 2021-11-27 LAB — LACTATE DEHYDROGENASE, PLEURAL OR PERITONEAL FLUID: LD, Fluid: 275 U/L — ABNORMAL HIGH (ref 3–23)

## 2021-11-27 LAB — CBC
HCT: 25.7 % — ABNORMAL LOW (ref 39.0–52.0)
Hemoglobin: 8.3 g/dL — ABNORMAL LOW (ref 13.0–17.0)
MCH: 31 pg (ref 26.0–34.0)
MCHC: 32.3 g/dL (ref 30.0–36.0)
MCV: 95.9 fL (ref 80.0–100.0)
Platelets: 231 10*3/uL (ref 150–400)
RBC: 2.68 MIL/uL — ABNORMAL LOW (ref 4.22–5.81)
RDW: 13.2 % (ref 11.5–15.5)
WBC: 6.6 10*3/uL (ref 4.0–10.5)
nRBC: 0 % (ref 0.0–0.2)

## 2021-11-27 LAB — BODY FLUID CELL COUNT WITH DIFFERENTIAL
Eos, Fluid: 0 %
Lymphs, Fluid: 17 %
Monocyte-Macrophage-Serous Fluid: 26 % — ABNORMAL LOW (ref 50–90)
Neutrophil Count, Fluid: 57 % — ABNORMAL HIGH (ref 0–25)
Total Nucleated Cell Count, Fluid: 11500 cu mm — ABNORMAL HIGH (ref 0–1000)

## 2021-11-27 LAB — BASIC METABOLIC PANEL
Anion gap: 7 (ref 5–15)
BUN: 68 mg/dL — ABNORMAL HIGH (ref 8–23)
CO2: 24 mmol/L (ref 22–32)
Calcium: 8.3 mg/dL — ABNORMAL LOW (ref 8.9–10.3)
Chloride: 106 mmol/L (ref 98–111)
Creatinine, Ser: 1.97 mg/dL — ABNORMAL HIGH (ref 0.61–1.24)
GFR, Estimated: 33 mL/min — ABNORMAL LOW (ref >60)
Glucose, Bld: 173 mg/dL — ABNORMAL HIGH (ref 70–99)
Potassium: 3.7 mmol/L (ref 3.5–5.1)
Sodium: 137 mmol/L (ref 135–145)

## 2021-11-27 LAB — GLUCOSE, CAPILLARY
Glucose-Capillary: 172 mg/dL — ABNORMAL HIGH (ref 70–99)
Glucose-Capillary: 242 mg/dL — ABNORMAL HIGH (ref 70–99)
Glucose-Capillary: 251 mg/dL — ABNORMAL HIGH (ref 70–99)
Glucose-Capillary: 209 mg/dL — ABNORMAL HIGH (ref 70–99)

## 2021-11-27 LAB — PROTEIN, PLEURAL OR PERITONEAL FLUID: Total protein, fluid: 3.8 g/dL

## 2021-11-27 MED ORDER — IOHEXOL 9 MG/ML PO SOLN
ORAL | Status: AC
  Filled 2021-11-27: qty 1000

## 2021-11-27 MED ORDER — LIDOCAINE HCL (PF) 2 % IJ SOLN
INTRAMUSCULAR | Status: AC
  Filled 2021-11-27: qty 5

## 2021-11-27 MED ORDER — LIDOCAINE HCL (PF) 2 % IJ SOLN
INTRAMUSCULAR | Status: AC
  Administered 2021-11-27: 10 mL
  Filled 2021-11-27: qty 10

## 2021-11-27 NOTE — Inpatient Diabetes Management (Signed)
Inpatient Diabetes Program Recommendations  AACE/ADA: New Consensus Statement on Inpatient Glycemic Control   Target Ranges:  Prepandial:   less than 140 mg/dL      Peak postprandial:   less than 180 mg/dL (1-2 hours)      Critically ill patients:  140 - 180 mg/dL    Latest Reference Range & Units 11/26/21 07:32 11/26/21 12:05 11/26/21 16:29 11/26/21 21:31  Glucose-Capillary 70 - 99 mg/dL 149 (H) 197 (H) 286 (H) 288 (H)   Review of Glycemic Control  Diabetes history: DM2 Outpatient Diabetes medications: Glipizide XL 10 mg BID, Metformin 1000 mg AM, Metformin 500 mg QPM, Actos 45 mg QD Current orders for Inpatient glycemic control: Glipizide XL 10 mg BID, Novolog 0-9 units TID with meals  Inpatient Diabetes Program Recommendations:    Insulin: Please consider increasing Novolog correction to 0-15 units TID with meals and adding Novolog 0-5 units QHS.  Thanks, Barnie Alderman, RN, MSN, [place] Diabetes Coordinator Inpatient Diabetes Program [phone] (Team Pager from 8am to [place])

## 2021-11-27 NOTE — Progress Notes (Signed)
Left sided thoracentesis and post chest xray completed at this time and read by radiologist. 900 ML of left pleural fluid removed and sent to lab for processing. PT returned to inpatient bed assignment this time and verbalized understanding of post procedure instructions.

## 2021-11-27 NOTE — Care Management Important Message (Signed)
Important Message  Patient Details  Name: Bradley Sheppard MRN: [id number] Date of Birth: 1940/12/04   Medicare Important Message Given:  Yes     Tommy Medal 11/27/2021, 12:09 PM

## 2021-11-27 NOTE — Procedures (Signed)
PreOperative Dx: LEFT pleural effusion Postoperative Dx: LEFT pleural effusion Procedure:   US guided LEFt thoracentesis Radiologist:  Thornton Papas Anesthesia:  10 ml of 1% lidocaine Specimen:  900 mL of yellow colored fluid EBL:   < 1 ml Complications: None

## 2021-11-27 NOTE — Progress Notes (Signed)
PROGRESS NOTE  Bradley Sheppard PNT:[id number] DOB: [birth year] DOA: 11/23/2021 PCP: [name], MD  Brief History:  Bradley Sheppard is a 81 year old male with extensive history of DM 2, HTN, HLD,MALT lymphoma, CAD presented to ED with chief complaint of shortness of breath -Patient reports shortness of breath has been going on for the past week, progressively getting worse especially with exertion. He denies any chest pain or cough.  Denies of having any recent illnesses States that he has CAD but has not been diagnosed with CHF.  And he is not O2 dependent.    ED course: Blood pressure 133/71, pulse 70, temperature 98.2 F (36.8 C), temperature source Oral, resp. rate 20, height 6' (1.829 m), weight 99.8 kg, SpO2 97 % on 2 L  CBC WBC 9.5, hemoglobin 9.8, CMP within normal limits with exception of BUN 48, creatinine 1.65, calcium 8.6, troponin 82 glucose 220 BNP 308, Total iron 44 D-dimer 2.4,  Chest x-ray: Moderate congestive heart failure CTA: 1. No CT evidence for acute pulmonary embolus. 2. Moderate right and small to moderate left pleural effusions with collapse/consolidation in both lower lobes. 3. Patchy bilateral ground-glass opacities, compatible with edema and/or infection. 4. Aortic Atherosclerosis     Assessment/Plan: Acute HFpEF - BNP was elevated to 308 on admission and CXR showed moderate pleural effusions with CTA being negative for a PE. 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 -Dr. 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 -Family requested Dr. Raliegh Ip to be consulted -appreciate input   HTN -continue coreg, amlodipine   HLD - Followed by his PCP as an outpatient. Remains on Crestor '20mg'$  daily.   CAD - prior stents in 2003 and most recent ischemic evaluation was in 2014 as outlined above. Repeat echo this admission shows a preserved EF with no regional WMA. He was previously having chest pain with coughing but no specific angina. No plans for further ischemic testing this admission. - Remains on ASA '81mg'$  daily, Coreg 18.'75mg'$  BID and Crestor '20mg'$  daily.    Aortic Stenosis - Moderate by repeat echo this admission. Continue to follow as an outpatient.   Abdominal pain -CT abd/pelvis         Family Communication:   spouse, daughter, son updated 7/27  Consultants:  pulm  Code Status:  FULL   DVT Prophylaxis:  [place] Heparin    Procedures: As Listed in Progress Note Above  Antibiotics: None   Total time spent 55 minutes.  Greater than 50% spent face to face counseling and coordinating care.    Subjective: Patient denies fevers, chills, headache, chest pain, dyspnea, nausea, vomiting, diarrhea, abdominal pain, dysuria,  hematuria, hematochezia, and melena.   Objective: Vitals:   11/27/21 0829 11/27/21 0830 11/27/21 0831 11/27/21 1411  BP:  140/60 140/60 (!) 124/59  Pulse: 67 67  69  Resp:      Temp:      TempSrc:      SpO2:    92%  Weight:      Height:        Intake/Output Summary (Last 24  hours) at 11/27/2021 1538 Last data filed at 11/27/2021 1348 Gross per 24 hour  Intake 580 ml  Output --  Net 580 ml   Weight change: -8.464 kg Exam:  General:  Pt is alert, follows commands appropriately, not in acute distress HEENT: No icterus, No thrush, No neck mass, [place]/AT Cardiovascular: RRR, S1/S2, no rubs, no gallops Respiratory: bibasilar crackles. No wheeze Abdomen: Soft/+BS, non tender, non distended, no guarding Extremities: 1+LE edema, No lymphangitis, No petechiae, No rashes, no synovitis   Data Reviewed: I have personally reviewed following labs and imaging studies Basic Metabolic Panel: Recent Labs  Lab 11/23/21 0640 11/24/21 0453 11/25/21 0552 11/26/21 0602 11/26/21 1751 11/27/21 0605  NA 137 138 137 137 134* 137  K 4.5 3.9 3.9 3.9 4.3 3.7  CL 111 109 108 106 103 106  CO2 '22 23 23 22 24 24  '$ GLUCOSE 220* 161* 164* 149* 319* 173*  BUN 48* 49* 61* 67* 70* 68*  CREATININE 1.65* 1.68* 1.92* 1.99* 2.17* 1.97*  CALCIUM 8.6* 8.6* 8.3* 8.3* 8.3* 8.3*  MG 2.1  --   --   --   --   --   PHOS 3.9  --   --   --   --   --    Liver Function Tests: Recent Labs  Lab 11/25/21 0552  ALBUMIN 2.9*   No results for input(s): "LIPASE", "AMYLASE" in the last 168 hours. No results for input(s): "AMMONIA" in the last 168 hours. Coagulation Profile: Recent Labs  Lab 11/23/21 0640 11/24/21 0453  INR 1.1 1.1   CBC: Recent Labs  Lab 11/23/21 0640 11/24/21 0453 11/25/21 0552 11/26/21 0602 11/27/21 0605  WBC 9.5 7.0 6.5 6.4 6.6  NEUTROABS 6.6  --   --   --   --   HGB 9.8* 8.7* 8.4* 8.6* 8.3*  HCT 30.1* 27.1* 25.7* 26.7* 25.7*  MCV 96.8 96.4 96.3 96.0 95.9  PLT 257 227 209 210 231   Cardiac Enzymes: No results for input(s): "CKTOTAL", "CKMB", "CKMBINDEX", "TROPONINI" in the last 168 hours. BNP: Invalid input(s): "POCBNP" CBG: Recent Labs  Lab 11/26/21 1205 11/26/21 1629 11/26/21 2131 11/27/21 0733 11/27/21 1137  GLUCAP 197* 286* 288* 172* 242*    HbA1C: No results for input(s): "HGBA1C" in the last 72 hours. Urine analysis:    Component Value Date/Time   COLORURINE YELLOW 12/26/2008 0950   APPEARANCEUR CLEAR 12/26/2008 0950   LABSPEC 1.016 12/26/2008 0950   PHURINE 5.5 12/26/2008 0950   GLUCOSEU NEGATIVE 12/26/2008 0950   HGBUR NEGATIVE 12/26/2008 0950   BILIRUBINUR NEGATIVE 12/26/2008 0950   KETONESUR NEGATIVE 12/26/2008 0950   PROTEINUR NEGATIVE 12/26/2008 0950   UROBILINOGEN 0.2 12/26/2008 0950   NITRITE NEGATIVE 12/26/2008 0950   LEUKOCYTESUR  12/26/2008 0950    NEGATIVE MICROSCOPIC NOT DONE ON URINES WITH NEGATIVE PROTEIN, BLOOD, LEUKOCYTES, NITRITE, OR GLUCOSE <1000 mg/dL.   Sepsis Labs: '@LABRCNTIP'$ (procalcitonin:4,lacticidven:4) ) Recent Results (from the past 240 hour(s))  Gram stain     Status: None   Collection Time: 11/24/21 12:40 PM   Specimen: Pleura  Result Value Ref Range Status   Specimen Description PLEURAL  Final   Special Requests PLEURAL  Final   Gram Stain   Final    WBC PRESENT,BOTH PMN AND MONONUCLEAR NO ORGANISMS SEEN CYTOSPIN SMEAR Performed at [facility], [address] [place], [place] [address]    Report Status 11/24/2021 FINAL  Final  Culture, body fluid w Gram Stain-bottle     Status: None (Preliminary result)   Collection Time: 11/24/21 12:40 PM   Specimen: Pleura  Result Value Ref Range Status   Specimen Description PLEURAL  Final   Special Requests   Final    BOTTLES DRAWN AEROBIC AND ANAEROBIC Blood Culture results may not be optimal due to an excessive volume of blood received in culture bottles   Culture   Final    NO GROWTH 3 DAYS Performed at [facility], [address], [place], [place] [address]    Report Status PENDING  Incomplete     Scheduled Meds:  amLODipine  10 mg Oral Daily   aspirin  81 mg Oral Daily   carvedilol  18.75 mg Oral BID WC   docusate sodium  100 mg Oral Daily   ferrous sulfate  325 mg Oral BID WC   furosemide  40 mg Oral Daily   glipiZIDE   10 mg Oral BID WC   heparin  5,000 Units Subcutaneous Q8H   insulin aspart  0-9 Units Subcutaneous TID WC   iohexol       lidocaine HCl (PF)       lidocaine HCl (PF)       LORazepam  1 mg Oral QHS   rosuvastatin  20 mg Oral Daily   sodium chloride flush  3 mL Intravenous Q12H   cyanocobalamin  500 mcg Oral Daily   Continuous Infusions:  Procedures/Studies: DG CHEST PORT 1 VIEW  Result Date: 11/27/2021 CLINICAL DATA:  Pleural effusion. EXAM: PORTABLE CHEST 1 VIEW COMPARISON:  One-view chest x-ray 11/24/2021 FINDINGS: Heart is enlarged. Lung volumes are low. Bilateral pleural effusions are noted, left greater than right. The left pleural effusion has increased. Asymmetric left lower lobe airspace disease is present. Degenerative changes are noted at the shoulders. IMPRESSION: 1. Cardiomegaly without failure. 2. Bilateral pleural effusions, left greater than right. 3. Asymmetric left lower lobe airspace disease likely reflects atelectasis. Infection is not excluded. Electronically Signed   By: [place] Morelle M.D.   On: 11/27/2021 09:59   DG Chest 1 View  Result Date: 11/24/2021 CLINICAL DATA:  RIGHT pleural effusion post thoracentesis EXAM: CHEST  1 VIEW COMPARISON:  11/24/2021 FINDINGS: Enlargement of cardiac silhouette with pulmonary vascular congestion. Markedly decreased RIGHT pleural effusion and basilar atelectasis. Residual small LEFT pleural effusion and basilar atelectasis. Interstitial infiltrate consistent with pulmonary edema/CHF. Atherosclerotic calcification aorta. No pneumothorax following thoracentesis. Osseous demineralization. IMPRESSION: Mild CHF with decreased RIGHT pleural effusion and basilar atelectasis post thoracentesis. No pneumothorax following RIGHT thoracentesis. Electronically Signed   By: Lavonia Dana M.D.   On: 11/24/2021 13:20   US THORACENTESIS ASP PLEURAL SPACE W/IMG GUIDE  Result Date: 11/24/2021 INDICATION: RIGHT pleural effusion EXAM: ULTRASOUND GUIDED  DIAGNOSTIC AND THERAPEUTIC RIGHT spine THORACENTESIS MEDICATIONS: None. COMPLICATIONS: None immediate. PROCEDURE: An ultrasound guided thoracentesis was thoroughly discussed with the patient and questions answered. The benefits, risks, alternatives and complications were also discussed. The patient understands and wishes to proceed with the procedure. Written consent was obtained. Ultrasound was performed to localize and mark an adequate pocket of fluid in the right chest.  The area was then prepped and draped in the normal sterile fashion. 1% Lidocaine was used for local anesthesia. Under ultrasound guidance a 6 Fr Safe-T-Centesis catheter was introduced. Thoracentesis was performed. The catheter was removed and a dressing applied. FINDINGS: A total of approximately 1.6 L of slightly cloudy yellow RIGHT pleural fluid was removed. Samples were sent to the laboratory as requested by the clinical team. IMPRESSION: Successful ultrasound guided RIGHT thoracentesis yielding 1.6 L of pleural fluid. Electronically Signed   By: Lavonia Dana M.D.   On: 11/24/2021 13:19   ECHOCARDIOGRAM COMPLETE  Result Date: 11/24/2021    ECHOCARDIOGRAM REPORT   Patient Name:   MAGGIE DWORKIN Date of Exam: 11/24/2021 Medical Rec #:  [id number]        Height:       72.0 in Accession #:    [id number]       Weight:       226.9 lb Date of Birth:  [birth year]        BSA:          2.248 m Patient Age:    81 years         BP:           141/59 mmHg Patient Gender: M                HR:           62 bpm. Exam Location:  [facility] Procedure: 2D Echo, Cardiac Doppler and Color Doppler Indications:    I33.9 SBE  History:        Patient has prior history of Echocardiogram examinations, most                 recent 07/09/2020. CHF, CAD and Previous Myocardial Infarction,                 Aortic Valve Disease, Signs/Symptoms:Murmur; Risk                 Factors:Hypertension, Dyslipidemia, Former Smoker and Diabetes.                 Hx of CKD.  Sonographer:     Alvino Chapel RCS Referring Phys: [place]  1. Left ventricular ejection fraction, by estimation, is 65 to 70%. The left ventricle has normal function. The left ventricle has no regional wall motion abnormalities. There is moderate concentric left ventricular hypertrophy. Left ventricular diastolic parameters are consistent with Grade II diastolic dysfunction (pseudonormalization). Elevated left ventricular end-diastolic pressure.  2. Right ventricular systolic function is normal. The right ventricular size is normal. Tricuspid regurgitation signal is inadequate for assessing PA pressure.  3. Left atrial size was severely dilated.  4. Right atrial size was moderately dilated.  5. A small pericardial effusion is present. The pericardial effusion is posterior to the left ventricle and anterior to the right ventricle. There is no evidence of cardiac tamponade.  6. The mitral valve is degenerative. Trivial mitral valve regurgitation.  7. The aortic valve is tricuspid with restricted motion of the left coronary cusp. There is moderate calcification of the aortic valve. Moderate aortic valve stenosis. Aortic valve mean gradient measures 18.0 mmHg. Dimentionless index 0.44.  8. The inferior vena cava is dilated in size with >50% respiratory variability, suggesting right atrial pressure of 8 mmHg. Comparison(s): Prior images reviewed side by side. LVEF vigorous with moderate diastolic dysfunction and increased LV filling pressure. Moderate aortic stenosis, mean gradient has increased from 13 mmHg  to 18 mmHg. FINDINGS  Left Ventricle: Left ventricular ejection fraction, by estimation, is 65 to 70%. The left ventricle has normal function. The left ventricle has no regional wall motion abnormalities. The left ventricular internal cavity size was normal in size. There is  moderate concentric left ventricular hypertrophy. Left ventricular diastolic parameters are consistent with Grade II diastolic  dysfunction (pseudonormalization). Elevated left ventricular end-diastolic pressure. Right Ventricle: The right ventricular size is normal. No increase in right ventricular wall thickness. Right ventricular systolic function is normal. Tricuspid regurgitation signal is inadequate for assessing PA pressure. Left Atrium: Left atrial size was severely dilated. Right Atrium: Right atrial size was moderately dilated. Pericardium: A small pericardial effusion is present. The pericardial effusion is posterior to the left ventricle and anterior to the right ventricle. There is no evidence of cardiac tamponade. Mitral Valve: The mitral valve is degenerative in appearance. There is mild thickening of the mitral valve leaflet(s). There is mild calcification of the mitral valve leaflet(s). Trivial mitral valve regurgitation. Tricuspid Valve: The tricuspid valve is grossly normal. Tricuspid valve regurgitation is trivial. Aortic Valve: The aortic valve is tricuspid. There is moderate calcification of the aortic valve. There is mild to moderate aortic valve annular calcification. Aortic valve regurgitation is not visualized. Moderate aortic stenosis is present. Aortic valve mean gradient measures 18.0 mmHg. Aortic valve peak gradient measures 33.4 mmHg. Aortic valve area, by VTI measures 1.37 cm. Pulmonic Valve: The pulmonic valve was grossly normal. Pulmonic valve regurgitation is trivial. Aorta: The aortic root is normal in size and structure. Venous: The inferior vena cava is dilated in size with greater than 50% respiratory variability, suggesting right atrial pressure of 8 mmHg. IAS/Shunts: No atrial level shunt detected by color flow Doppler.  LEFT VENTRICLE PLAX 2D LVIDd:         4.40 cm   Diastology LVIDs:         2.70 cm   LV e' medial:    7.29 cm/s LV PW:         1.50 cm   LV E/e' medial:  18.9 LV IVS:        1.70 cm   LV e' lateral:   6.74 cm/s LVOT diam:     2.00 cm   LV E/e' lateral: 20.5 LV SV:         92 LV SV  Index:   41 LVOT Area:     3.14 cm  RIGHT VENTRICLE RV S prime:     15.80 cm/s TAPSE (M-mode): 2.8 cm LEFT ATRIUM              Index        RIGHT ATRIUM           Index LA diam:        4.30 cm  1.91 cm/m   RA Area:     26.20 cm LA Vol (A2C):   99.3 ml  44.18 ml/m  RA Volume:   90.00 ml  40.04 ml/m LA Vol (A4C):   138.0 ml 61.39 ml/m LA Biplane Vol: 119.0 ml 52.94 ml/m  AORTIC VALVE AV Area (Vmax):    1.33 cm AV Area (Vmean):   1.35 cm AV Area (VTI):     1.37 cm AV Vmax:           289.00 cm/s AV Vmean:          199.000 cm/s AV VTI:            0.669 m AV  Peak Grad:      33.4 mmHg AV Mean Grad:      18.0 mmHg LVOT Vmax:         122.33 cm/s LVOT Vmean:        85.400 cm/s LVOT VTI:          0.292 m LVOT/AV VTI ratio: 0.44  AORTA Ao Root diam: 3.70 cm MITRAL VALVE MV Area (PHT): 3.03 cm     SHUNTS MV Decel Time: 250 msec     Systemic VTI:  0.29 m MV E velocity: 138.00 cm/s  Systemic Diam: 2.00 cm MV A velocity: 77.10 cm/s MV E/A ratio:  1.79 Rozann Lesches MD Electronically signed by Rozann Lesches MD Signature Date/Time: 11/24/2021/11:51:56 AM    Final    DG CHEST PORT 1 VIEW  Result Date: 11/24/2021 CLINICAL DATA:  Worsening shortness of breath with exertion. EXAM: PORTABLE CHEST 1 VIEW COMPARISON:  Chest radiograph 1 day prior FINDINGS: The cardiomediastinal silhouette is stable. Moderate right larger than left pleural effusions are again seen with patchy opacities in the lower lobes, right worse than left. There is vascular congestion and mild pulmonary interstitial edema. Overall, aeration appears slightly worsened in the right lower lobe compared to the study from 1 day prior. There is no pneumothorax The bones are stable. IMPRESSION: Moderate right larger than left pleural effusions with adjacent opacities in the lower lobes, worsened in the right base compared to the study from 1 day prior. Electronically Signed   By: Valetta Mole M.D.   On: 11/24/2021 08:16   CT Angio Chest PE W and/or Wo  Contrast  Result Date: 11/23/2021 CLINICAL DATA:  History of MALT lymphoma. Exertional dyspnea. Clinical concern for pulmonary embolus. EXAM: CT ANGIOGRAPHY CHEST WITH CONTRAST TECHNIQUE: Multidetector CT imaging of the chest was performed using the standard protocol during bolus administration of intravenous contrast. Multiplanar CT image reconstructions and MIPs were obtained to evaluate the vascular anatomy. RADIATION DOSE REDUCTION: This exam was performed according to the departmental dose-optimization program which includes automated exposure control, adjustment of the mA and/or kV according to patient size and/or use of iterative reconstruction technique. CONTRAST:  80m OMNIPAQUE IOHEXOL 350 MG/ML SOLN COMPARISON:  PET-CT 08/28/2021 FINDINGS: Cardiovascular: Heart is enlarged. Coronary artery calcification is evident. Mild atherosclerotic calcification is noted in the wall of the thoracic aorta. There is no filling defect within the opacified pulmonary arteries to suggest the presence of an acute pulmonary embolus. Mediastinum/Nodes: No mediastinal lymphadenopathy. There is no hilar lymphadenopathy. The esophagus has normal imaging features. There is no axillary lymphadenopathy. Lungs/Pleura: Fine architectural lung detail obscured by substantial breathing motion during image acquisition. Probable component of underlying emphysema. There is collapse/consolidation in both lower lobes with superimposed patchy bilateral ground-glass opacities. Moderate right and small to moderate left pleural effusions evident. Upper Abdomen: Unremarkable. Musculoskeletal: No worrisome lytic or sclerotic osseous abnormality. Review of the MIP images confirms the above findings. IMPRESSION: 1. No CT evidence for acute pulmonary embolus. 2. Moderate right and small to moderate left pleural effusions with collapse/consolidation in both lower lobes. 3. Patchy bilateral ground-glass opacities, compatible with edema and/or  infection. 4. Aortic Atherosclerosis (ICD10-I70.0). Electronically Signed   By: EMisty StanleyM.D.   On: 11/23/2021 08:59   DG Chest 2 View  Result Date: 11/23/2021 CLINICAL DATA:  Shortness of breath. EXAM: CHEST - 2 VIEW COMPARISON:  11/23/2021 FINDINGS: Stable cardiac enlargement. Aortic atherosclerotic calcifications. Small bilateral pleural effusions noted, right greater than left. Mild diffuse interstitial edema. No  airspace consolidation. IMPRESSION: 1. Moderate congestive heart failure. Electronically Signed   By: Kerby Moors M.D.   On: 11/23/2021 07:08   MR PELVIS WO CM RECTAL CA STAGING  Result Date: 11/10/2021 CLINICAL DATA:  Rectal carcinoma.  Local staging. EXAM: MRI PELVIS WITHOUT CONTRAST TECHNIQUE: Multiplanar multisequence MR imaging of the pelvis was performed. No intravenous contrast was administered. Rectal ultrasound gel could not be administered as it was not tolerated by the patient. COMPARISON:  None Available. FINDINGS: TUMOR LOCATION Evaluation limited by lack of rectal distention by ultrasound gel. Tumor distance from Anal Verge/Skin Surface:  3.0 cm Tumor distance to Internal Anal Sphincter: 0 cm; tumor does contact the internal sphincter TUMOR DESCRIPTION Circumferential Extent: 100% Tumor Length: 3.0 cm T - CATEGORY Extension through Muscularis Propria: No= T1/T2 Shortest Distance of any tumor/node from Mesorectal Fascia: N/a Extramural Vascular Invasion/Tumor Thrombus: No Invasion of Anterior Peritoneal Reflection: No Involvement of Adjacent Organs or Pelvic Sidewall: No Levator Ani Involvement: No N - CATEGORY Mesorectal Lymph Nodes >=59m: None=N0 Extra-mesorectal Lymphadenopathy: 2 mildly enlarged left external iliac lymph nodes are seen which measure 1.3 cm and 1.6 cm in maximum diameter. Other:  Sigmoid diverticulosis, without evidence of diverticulitis. 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 Distance from tumor to the internal anal sphincter is 0 cm. Electronically Signed   By: JMarlaine HindM.D.   On: 11/10/2021 09:22    DOrson Eva DO  [facility]  If 7PM-7AM, please contact night-coverage [email or web address] Password [facility]7/27/2023, 3:38 PM   LOS: 4 days

## 2021-11-28 ENCOUNTER — Inpatient Hospital Stay (HOSPITAL_COMMUNITY): Payer: PPO

## 2021-11-28 DIAGNOSIS — R0602 Shortness of breath: Secondary | ICD-10-CM | POA: Diagnosis not present

## 2021-11-28 DIAGNOSIS — J90 Pleural effusion, not elsewhere classified: Secondary | ICD-10-CM | POA: Diagnosis not present

## 2021-11-28 DIAGNOSIS — C884 Extranodal marginal zone B-cell lymphoma of mucosa-associated lymphoid tissue [MALT-lymphoma]: Secondary | ICD-10-CM | POA: Diagnosis not present

## 2021-11-28 DIAGNOSIS — N182 Chronic kidney disease, stage 2 (mild): Secondary | ICD-10-CM | POA: Diagnosis not present

## 2021-11-28 LAB — BASIC METABOLIC PANEL
Anion gap: 6 (ref 5–15)
BUN: 70 mg/dL — ABNORMAL HIGH (ref 8–23)
CO2: 24 mmol/L (ref 22–32)
Calcium: 8.2 mg/dL — ABNORMAL LOW (ref 8.9–10.3)
Chloride: 107 mmol/L (ref 98–111)
Creatinine, Ser: 1.92 mg/dL — ABNORMAL HIGH (ref 0.61–1.24)
GFR, Estimated: 34 mL/min — ABNORMAL LOW (ref >60)
Glucose, Bld: 142 mg/dL — ABNORMAL HIGH (ref 70–99)
Potassium: 3.9 mmol/L (ref 3.5–5.1)
Sodium: 137 mmol/L (ref 135–145)

## 2021-11-28 LAB — CBC
HCT: 25.6 % — ABNORMAL LOW (ref 39.0–52.0)
Hemoglobin: 8.4 g/dL — ABNORMAL LOW (ref 13.0–17.0)
MCH: 31.2 pg (ref 26.0–34.0)
MCHC: 32.8 g/dL (ref 30.0–36.0)
MCV: 95.2 fL (ref 80.0–100.0)
Platelets: 215 10*3/uL (ref 150–400)
RBC: 2.69 MIL/uL — ABNORMAL LOW (ref 4.22–5.81)
RDW: 13.1 % (ref 11.5–15.5)
WBC: 6.2 10*3/uL (ref 4.0–10.5)
nRBC: 0 % (ref 0.0–0.2)

## 2021-11-28 LAB — GLUCOSE, CAPILLARY
Glucose-Capillary: 135 mg/dL — ABNORMAL HIGH (ref 70–99)
Glucose-Capillary: 177 mg/dL — ABNORMAL HIGH (ref 70–99)
Glucose-Capillary: 226 mg/dL — ABNORMAL HIGH (ref 70–99)
Glucose-Capillary: 188 mg/dL — ABNORMAL HIGH (ref 70–99)

## 2021-11-28 LAB — MAGNESIUM: Magnesium: 2.1 mg/dL (ref 1.7–2.4)

## 2021-11-28 MED ORDER — AMPICILLIN-SULBACTAM SODIUM 3 (2-1) G IJ SOLR
3.0000 g | Freq: Four times a day (QID) | INTRAMUSCULAR | Status: DC
Start: 2021-11-28 — End: 2021-12-01
  Administered 2021-11-28 – 2021-12-01 (×12): 3 g via INTRAVENOUS
  Filled 2021-11-28 (×12): qty 8

## 2021-11-28 MED ORDER — POLYETHYLENE GLYCOL 3350 17 G PO PACK
17.0000 g | PACK | Freq: Every day | ORAL | Status: DC
  Administered 2021-11-28 – 2021-12-01 (×4): 17 g via ORAL
  Filled 2021-11-28 (×4): qty 1

## 2021-11-28 MED ORDER — AMPICILLIN-SULBACTAM SODIUM 1.5 (1-0.5) G IJ SOLR: 1.5000 g | Freq: Four times a day (QID) | INTRAMUSCULAR | Status: DC

## 2021-11-28 MED ORDER — IOHEXOL 9 MG/ML PO SOLN
ORAL | Status: AC
  Filled 2021-11-28: qty 1000

## 2021-11-28 NOTE — TOC Transition Note (Signed)
Transition of Care [facility]) - CM/SW Discharge Note   Patient Details  Name: Bradley Sheppard MRN: [id number] Date of Birth: [birth year]  [facility] CM/SW Contact:  Ihor Gully, LCSW Phone Number: 11/28/2021, 2:58 PM   Clinical Narrative:    [name] requests a hospital bed. Discussed options for equipment. Hospital bed ordered via [employer].      Barriers to Discharge: Continued Medical Work up   Patient Goals and CMS Choice Patient states their goals for this hospitalization and ongoing recovery are:: return home.      Discharge Placement                       Discharge Plan and Services                DME Arranged: Hospital bed DME Agency: [employer] Date DME Agency Contacted: 11/26/21 Time DME Agency Contacted: [phone] Representative spoke with at DME Agency: [place]: RN, PT, OT, Nurse's Aide [place] Agency: [place] ([place]) Date [place]: 11/26/21 Time [place]: 1254 Representative spoke with at [place]: [place] ([place]) Interventions     Readmission Risk Interventions     No data to display

## 2021-11-28 NOTE — Progress Notes (Signed)
Patient requires frequent re-positioning of the body in ways that cannot be achieved with an ordinary bed or wedge pillow to eliminate pain and the head of the bed needs to be evaluated more than 30 degrees most of the time 

## 2021-11-28 NOTE — Progress Notes (Signed)
NAME:  Bradley Sheppard, MRN:  [id number], DOB:  11/22/1940, LOS: 5 ADMISSION DATE:  11/23/2021, CONSULTATION DATE:  11/28/2021  REFERRING MD:  Wilber Bihari CHIEF COMPLAINT: Exudative pleural effusion  History of Present Illness:  81 year old man admitted with shortness of breath for 1 week, especially on exertion , chest discomfort with coughing, 93% saturation on room air. Initial labs showed D-dimer 2.4, BNP 308, troponin 82, BUN/creatinine was 48/1.6. 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 Johney Maine Diabetes type 2 Hypertension  Significant Hospital Events: Including procedures, antibiotic start and stop dates in addition to other pertinent events   7/24 right thoracentesis, 1.6 L fluid , neutrophilic exudate 6/60 CT angiogram chest moderate right and small to moderate left effusion with patchy bilateral groundglass opacities 7/27 thoracentesis left >> 900 cc fluid , neutrophilic exudate  Interim History / Subjective:  He had gone down to CT scan. Wife and daughter in his room Low-grade febrile 99 8 overnight   Objective   Blood pressure (!) 130/59, pulse 71, temperature 99.5 F (37.5 C), resp. rate 18, height 6' (1.829 m), weight 96.9 kg, SpO2 92 %.        Intake/Output Summary (Last 24 hours) at 11/28/2021 1410 Last data filed at 11/28/2021 1100 Gross per 24 hour  Intake 360 ml  Output 1600 ml  Net -1240 ml    Filed Weights   11/26/21 0439 11/27/21 0616 11/28/21 0640  Weight: 100 kg 91.5 kg 96.9 kg    Examination: Unable to examine  Labs show normal electrolytes, BNP  177, no leukocytosis, stable anemia  Chest x-ray shows improved bilateral effusions Pleural fluid shows 11.5 K cells, predominant neutrophils, Gram stain negative  Resolved Hospital Problem list     Assessment & Plan:  Bilateral pleural effusions right more than left -Appears to be neutrophilic exudate on bilateral thoracentesis -Cytology showed reactive mesothelial cells -Pleural fluid cultures negative -?  Pulmonary fibrosis  Unclear etiology , interestingly PET scan 08/2021 did not show any evidence of pleural hypermetabolism , so malignancy seems unlikely , he did have nonspecific subpleural reticulation raising the possibility of underlying fibrosis -At this point I do not think we have enough evidence to subject him to a pleural biopsy.  Would suggest empiric course of antibiotics such as Unasyn and complete with Augmentin as outpatient, follow-up with serial imaging to see if effusion recurs  HFrEF -Diuresis was limited by renal function  New diagnosis of lymphoma/MALT Rectal polyp?  New primary, surgery is planned for 8/30 Due to abdominal pain, CT abdomen and pelvis is being obtained and this can be reviewed for pleural pathology  Discussed plan of care with wife and daughter at the bedside  Best Practice (right click and "Reselect all SmartList Selections" daily)    Code Status:  full code Last date of multidisciplinary goals of care discussion [`NA]  Labs   CBC: Recent Labs  Lab 11/23/21 0640 11/24/21 0453 11/25/21 0552 11/26/21 0602 11/27/21 0605 11/28/21 0553  WBC 9.5 7.0 6.5 6.4 6.6 6.2  NEUTROABS 6.6  --   --   --   --   --  HGB 9.8* 8.7* 8.4* 8.6* 8.3* 8.4*  HCT 30.1* 27.1* 25.7* 26.7* 25.7* 25.6*  MCV 96.8 96.4 96.3 96.0 95.9 95.2  PLT 257 227 209 210 231 215     Basic Metabolic Panel: Recent Labs  Lab 11/23/21 0640 11/24/21 0453 11/25/21 0552 11/26/21 0602 11/26/21 1751 11/27/21 0605 11/28/21 0553  NA 137   < > 137 137 134* 137 137  K  4.5   < > 3.9 3.9 4.3 3.7 3.9  CL 111   < > 108 106 103 106 107  CO2 22   < > 23 22 24 24 24   GLUCOSE 220*   < > 164* 149* 319* 173* 142*  BUN 48*   < > 61* 67* 70* 68* 70*  CREATININE 1.65*   < > 1.92* 1.99* 2.17* 1.97* 1.92*  CALCIUM 8.6*   < > 8.3* 8.3* 8.3* 8.3* 8.2*  MG 2.1  --   --   --   --   --  2.1  PHOS 3.9  --   --   --   --   --   --    < > = values in this interval not displayed.    GFR: Estimated Creatinine Clearance: 35.2 mL/min (A) (by C-G formula based on SCr of 1.92 mg/dL (H)). Recent Labs  Lab 11/24/21 0453 11/24/21 0741 11/25/21 0552 11/26/21 0602 11/27/21 0605 11/28/21 0553  PROCALCITON 0.20  --   --   --   --   --   WBC 7.0  --  6.5 6.4 6.6 6.2  LATICACIDVEN  --  0.6  --   --   --   --      Liver Function Tests: Recent Labs  Lab 11/25/21 0552  ALBUMIN 2.9*    No results for input(s): "LIPASE", "AMYLASE" in the last 168 hours. No results for input(s): "AMMONIA" in the last 168 hours.  ABG No results found for: "PHART", "PCO2ART", "PO2ART", "HCO3", "TCO2", "ACIDBASEDEF", "O2SAT"   Coagulation Profile: Recent Labs  Lab 11/23/21 0640 11/24/21 0453  INR 1.1 1.1     Cardiac Enzymes: No results for input(s): "CKTOTAL", "CKMB", "CKMBINDEX", "TROPONINI" in the last 168 hours.  HbA1C: Hgb A1c MFr Bld  Date/Time Value Ref Range Status  11/23/2021 06:40 AM 7.6 (H) 4.8 - 5.6 % Final    Comment:    (NOTE) Pre diabetes:          5.7%-6.4%  Diabetes:              >6.4%  Glycemic control for   <7.0% adults with diabetes     CBG: Recent Labs  Lab 11/27/21 1137 11/27/21 1627 11/27/21 2047 11/28/21 0712 11/28/21 1108  GLUCAP 242* 251* 209* [place] MD. FCCP.  Pulmonary & Critical care Pager : [phone]  If no response to pager , please call [phone] until 7 pm After 7:00 pm call Elink  [phone]   11/28/2021

## 2021-11-28 NOTE — Progress Notes (Addendum)
PROGRESS NOTE  [name] ION:[id number] DOB: [birth year] DOA: 11/23/2021 PCP: [name], MD  Brief History:  Bradley Sheppard is a 81 year old male with extensive history of DM 2, HTN, HLD,MALT lymphoma, CAD presented to ED with chief complaint of shortness of breath -Patient reports shortness of breath has been going on for the past week, progressively getting worse especially with exertion. He denies any chest pain or cough.  Denies of having any recent illnesses States that he has CAD but has not been diagnosed with CHF.  And he is not O2 dependent.    ED course: Blood pressure 133/71, pulse 70, temperature 98.2 F (36.8 C), temperature source Oral, resp. rate 20, height 6' (1.829 m), weight 99.8 kg, SpO2 97 % on 2 L  CBC WBC 9.5, hemoglobin 9.8, CMP within normal limits with exception of BUN 48, creatinine 1.65, calcium 8.6, troponin 82 glucose 220 BNP 308, Total iron 44 D-dimer 2.4,  Chest x-ray: Moderate congestive heart failure CTA: 1. No CT evidence for acute pulmonary embolus. 2. Moderate right and small to moderate left pleural effusions with collapse/consolidation in both lower lobes. 3. Patchy bilateral ground-glass opacities, compatible with edema and/or infection. 4. Aortic Atherosclerosis      Assessment and Plan: Acute HFpEF - BNP was elevated to 308 on admission and CXR showed moderate pleural effusions with CTA being negative for a PE. -7/24 Echo--EF 65-70%, G2DD -7/24 Right thora--1.6L removed -responded well to IV lasix -7/26 now on po lasix -appreciate cardiology -I/Os incomplete   Pleural Effusion -exudative by Light's criteria -7/24Right Thora--WBC 6234, LDH 171 (63 N, 8 Mono) -cytology negative for malignant cells -fluid cultures neg -appreciate pulm consult -PET scan 08/2021 did not show any evidence of pleural hypermetabolism, so malignancy seems unlikely -follow-up with serial imaging to see if effusion recurs -7/23 CTA  chest neg for PE 7/27 CXR--personally reviewed--left pleural effusion>>request thora 7/27--Left thora--900 cc removed (WBC 11.5K) 7/28--discussed with Dr. 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 -Dr. 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 -Family requested Dr. Raliegh Ip to be consulted -appreciate input   HTN -continue coreg, amlodipine   HLD - Followed by his PCP as an outpatient. Remains on Crestor '20mg'$  daily.   CAD - prior stents in 2003 and most recent ischemic evaluation was in 2014 as outlined above. Repeat echo this admission shows a preserved EF with no regional WMA. He was previously having chest pain with coughing but no specific angina. No plans for further ischemic testing this admission. - Remains on ASA '81mg'$  daily, Coreg 18.'75mg'$  BID and Crestor '20mg'$  daily.    Aortic Stenosis - Moderate by repeat echo this admission. Continue to follow as an outpatient.    Abdominal pain -CT abd/pelvis--neg for acute findins  Diabetes mellitus type 2 with hyperglycemia 11/23/21 A1C--7.6 Novolog sliding scale   Family Communication:   spouse, daughter, son updated 7/28   Consultants:  pulm   Code Status:  FULL    DVT Prophylaxis:  [place] Heparin      Procedures: As Listed in  Progress Note Above   Antibiotics: Unasyn 7/28>>      Subjective: Pt complains of LUQ, LLQ pain.  Denies f/c, cp, n/v/d.  Eating well.  Overall breathing better  Objective: Vitals:   11/28/21 0640 11/28/21 0824 11/28/21 0826 11/28/21 1323  BP: (!) 139/59  (!)  145/58 (!) 130/59  Pulse: 77 73  71  Resp: 18   18  Temp: 99.8 F (37.7 C)   99.5 F (37.5 C)  TempSrc: Oral     SpO2: 92%   92%  Weight: 96.9 kg     Height:        Intake/Output Summary (Last 24 hours) at 11/28/2021 1611 Last data filed at 11/28/2021 1100 Gross per 24 hour  Intake 360 ml  Output 1600 ml  Net -1240 ml   Weight change: 5.398 kg Exam:  General:  Pt is alert, follows commands appropriately, not in acute distress HEENT: No icterus, No thrush, No neck mass, [place]/AT Cardiovascular: RRR, S1/S2, no rubs, no gallops Respiratory: bibasilar crackles. No wheeze Abdomen: Soft/+BS, non tender, non distended, no guarding Extremities: No edema, No lymphangitis, No petechiae, No rashes, no synovitis   Data Reviewed: I have personally reviewed following labs and imaging studies Basic Metabolic Panel: Recent Labs  Lab 11/23/21 0640 11/24/21 0453 11/25/21 0552 11/26/21 0602 11/26/21 1751 11/27/21 0605 11/28/21 0553  NA 137   < > 137 137 134* 137 137  K 4.5   < > 3.9 3.9 4.3 3.7 3.9  CL 111   < > 108 106 103 106 107  CO2 22   < > '23 22 24 24 24  '$ GLUCOSE 220*   < > 164* 149* 319* 173* 142*  BUN 48*   < > 61* 67* 70* 68* 70*  CREATININE 1.65*   < > 1.92* 1.99* 2.17* 1.97* 1.92*  CALCIUM 8.6*   < > 8.3* 8.3* 8.3* 8.3* 8.2*  MG 2.1  --   --   --   --   --  2.1  PHOS 3.9  --   --   --   --   --   --    < > = values in this interval not displayed.   Liver Function Tests: Recent Labs  Lab 11/25/21 0552  ALBUMIN 2.9*   No results for input(s): "LIPASE", "AMYLASE" in the last 168 hours. No results for input(s): "AMMONIA" in the last 168 hours. Coagulation Profile: Recent Labs  Lab 11/23/21 0640 11/24/21 0453  INR 1.1 1.1   CBC: Recent Labs  Lab 11/23/21 0640 11/24/21 0453 11/25/21 0552 11/26/21 0602 11/27/21 0605 11/28/21 0553  WBC 9.5 7.0 6.5 6.4 6.6 6.2  NEUTROABS 6.6  --   --   --   --   --   HGB 9.8* 8.7* 8.4* 8.6* 8.3* 8.4*  HCT 30.1* 27.1* 25.7*  26.7* 25.7* 25.6*  MCV 96.8 96.4 96.3 96.0 95.9 95.2  PLT 257 227 209 210 231 215   Cardiac Enzymes: No results for input(s): "CKTOTAL", "CKMB", "CKMBINDEX", "TROPONINI" in the last 168 hours. BNP: Invalid input(s): "POCBNP" CBG: Recent Labs  Lab 11/27/21 1137 11/27/21 1627 11/27/21 2047 11/28/21 0712 11/28/21 1108  GLUCAP 242* 251* 209* 135* 177*   HbA1C: No results for input(s): "HGBA1C" in the last 72 hours. Urine analysis:    Component Value Date/Time   COLORURINE YELLOW 12/26/2008 0950   APPEARANCEUR CLEAR 12/26/2008 0950   LABSPEC 1.016 12/26/2008 0950   PHURINE 5.5 12/26/2008 0950   GLUCOSEU NEGATIVE 12/26/2008  [place] 12/26/2008 0950   BILIRUBINUR NEGATIVE 12/26/2008 0950   KETONESUR NEGATIVE 12/26/2008 0950   PROTEINUR NEGATIVE 12/26/2008 0950   UROBILINOGEN 0.2 12/26/2008 0950   NITRITE NEGATIVE 12/26/2008 0950   LEUKOCYTESUR  12/26/2008 0950    NEGATIVE MICROSCOPIC NOT DONE ON URINES WITH NEGATIVE PROTEIN, BLOOD, LEUKOCYTES, NITRITE, OR GLUCOSE <1000 mg/dL.   Sepsis Labs: '@LABRCNTIP'$ (procalcitonin:4,lacticidven:4) ) Recent Results (from the past 240 hour(s))  Gram stain     Status: None   Collection Time: 11/24/21 12:40 PM   Specimen: Pleura  Result Value Ref Range Status   Specimen Description PLEURAL  Final   Special Requests PLEURAL  Final   Gram Stain   Final    WBC PRESENT,BOTH PMN AND MONONUCLEAR NO ORGANISMS SEEN CYTOSPIN SMEAR Performed at [facility], [address], [place], [place] [address]    Report Status 11/24/2021 FINAL  Final  Culture, body fluid w Gram Stain-bottle     Status: None (Preliminary result)   Collection Time: 11/24/21 12:40 PM   Specimen: Pleura  Result Value Ref Range Status   Specimen Description PLEURAL  Final   Special Requests   Final    BOTTLES DRAWN AEROBIC AND ANAEROBIC Blood Culture results may not be optimal due to an excessive volume of blood received in culture bottles   Culture   Final     NO GROWTH 4 DAYS Performed at [facility], [address] [place], [place] [address]    Report Status PENDING  Incomplete  Gram stain     Status: None   Collection Time: 11/27/21  3:45 PM   Specimen: Pleura  Result Value Ref Range Status   Specimen Description PLEURAL LEFT  Final   Special Requests NONE  Final   Gram Stain   Final    NO ORGANISMS SEEN WBC PRESENT,BOTH PMN AND MONONUCLEAR CYTOSPIN SMEAR PERFORMED AT [facility] Performed at [facility], [address] [place], [place] [address]    Report Status 11/27/2021 FINAL  Final  Culture, body fluid w Gram Stain-bottle     Status: None (Preliminary result)   Collection Time: 11/27/21  3:45 PM   Specimen: Pleura  Result Value Ref Range Status   Specimen Description   Final    PLEURAL LEFT Performed at [facility], [address], [place], [place] [address]    Special Requests   Final    BOTTLES DRAWN AEROBIC AND ANAEROBIC Blood Culture adequate volume Performed at [facility], [place] [address] [place], [place] [address]    Culture   Final    NO GROWTH < 12 HOURS Performed at [facility], [address], [place], [place] [address]    Report Status PENDING  Incomplete     Scheduled Meds:  amLODipine  10 mg Oral Daily   aspirin  81 mg Oral Daily   carvedilol  18.75 mg Oral BID WC   docusate sodium  100 mg Oral Daily   ferrous sulfate  325 mg Oral BID WC   furosemide  40 mg Oral Daily   glipiZIDE  10 mg Oral BID WC   heparin  5,000 Units Subcutaneous Q8H   insulin aspart  0-9 Units Subcutaneous TID WC   LORazepam  1 mg Oral QHS   rosuvastatin  20 mg Oral Daily   sodium chloride flush  3 mL Intravenous Q12H   cyanocobalamin  500 mcg Oral Daily   Continuous Infusions:  ampicillin-sulbactam (UNASYN) IV      Procedures/Studies:  CT ABDOMEN PELVIS WO CONTRAST  Result Date: 11/28/2021 CLINICAL DATA:  LEFT lower quadrant abdominal pain. EXAM: CT ABDOMEN AND PELVIS WITHOUT CONTRAST TECHNIQUE: Multidetector CT imaging  of the abdomen and pelvis was performed following the standard protocol without IV contrast. RADIATION DOSE REDUCTION: This exam was performed according to the departmental dose-optimization program which includes automated exposure control, adjustment of the mA and/or kV according to patient size and/or use of iterative reconstruction technique. COMPARISON:  None Available. FINDINGS: Lower chest: Bilateral pleural effusions and dense bibasilar atelectasis. Decrease in RIGHT pleural effusion compared to CT 5 days prior. There is high-density within the periphery of the atelectatic lungs . Hepatobiliary: No focal hepatic lesion. No biliary duct dilatation. Common bile duct is normal. Gallbladder normal Pancreas: Pancreas is normal. No ductal dilatation. No pancreatic inflammation. Spleen: Normal spleen Adrenals/urinary tract: Adrenal glands and kidneys are normal. The ureters and bladder normal. Stomach/Bowel: Stomach, small bowel, appendix, and cecum are normal. The colon and rectosigmoid colon are normal. Moderate volume stool in the colon. Vascular/Lymphatic: Abdominal aorta is normal caliber with atherosclerotic calcification. There is no retroperitoneal or periportal lymphadenopathy. No pelvic lymphadenopathy. Reproductive: Prostate unremarkable Other: No free fluid. Musculoskeletal: No aggressive osseous lesion. Scoliosis with disc osteophytic disease. IMPRESSION: 1. No explanation for LEFT lower quadrant pain. No diverticulitis or hernia. No ureterolithiasis or obstructive uropathy. 2. Bilateral pleural effusions and dense bibasilar atelectasis. Decrease in RIGHT pleural effusion. 3. High-density within the atelectatic lungs unchanged. Electronically Signed   By: Suzy Bouchard M.D.   On: 11/28/2021 14:36   DG Chest 1 View  Result Date: 11/27/2021 CLINICAL DATA:  LEFT pleural effusion post thoracentesis EXAM: CHEST  1 VIEW COMPARISON:  Repeat exam 1603 hours compared to 0947 hours FINDINGS: Enlargement  of cardiac silhouette with slight vascular congestion. Atherosclerotic calcification aorta. Minimal bibasilar effusions and atelectasis slightly greater on LEFT. No pneumothorax following thoracentesis. Bones demineralized with BILATERAL glenohumeral degenerative changes and skin dextroconvex scoliosis thoracic spine. IMPRESSION: No pneumothorax following LEFT thoracentesis. Otherwise no change. Electronically Signed   By: Lavonia Dana M.D.   On: 11/27/2021 16:44   US THORACENTESIS ASP PLEURAL SPACE W/IMG GUIDE  Result Date: 11/27/2021 INDICATION: LEFT pleural effusion, LEFT chest pain EXAM: ULTRASOUND GUIDED DIAGNOSTIC AND THERAPEUTIC LEFT THORACENTESIS MEDICATIONS: None. COMPLICATIONS: None immediate. PROCEDURE: An ultrasound guided thoracentesis was thoroughly discussed with the patient and questions answered. The benefits, risks, alternatives and complications were also discussed. The patient understands and wishes to proceed with the procedure. Written consent was obtained. Ultrasound was performed to localize and mark an adequate pocket of fluid in the LEFT chest. The area was then prepped and draped in the normal sterile fashion. 1% Lidocaine was used for local anesthesia. Under ultrasound guidance a 6 Fr Safe-T-Centesis catheter was introduced. Thoracentesis was performed. The catheter was removed and a dressing applied. FINDINGS: A total of approximately 900 mL of yellow fluid was removed. Samples were sent to the laboratory as requested by the clinical team. IMPRESSION: Successful ultrasound guided LEFT thoracentesis yielding 900 mL of pleural fluid. Electronically Signed   By: Lavonia Dana M.D.   On: 11/27/2021 16:40   DG CHEST PORT 1 VIEW  Result Date: 11/27/2021 CLINICAL DATA:  Pleural effusion. EXAM: PORTABLE CHEST 1 VIEW COMPARISON:  One-view chest x-ray 11/24/2021 FINDINGS: Heart is enlarged. Lung volumes are low. Bilateral pleural effusions are noted, left greater than right. The left pleural  effusion has increased. Asymmetric left lower lobe airspace disease is present. Degenerative changes are noted at  the shoulders. IMPRESSION: 1. Cardiomegaly without failure. 2. Bilateral pleural effusions, left greater than right. 3. Asymmetric left lower lobe airspace disease likely reflects atelectasis. Infection is not excluded. Electronically Signed   By: San Morelle M.D.   On: 11/27/2021 09:59   DG Chest 1 View  Result Date: 11/24/2021 CLINICAL DATA:  RIGHT pleural effusion post thoracentesis EXAM: CHEST  1 VIEW COMPARISON:  11/24/2021 FINDINGS: Enlargement of cardiac silhouette with pulmonary vascular congestion. Markedly decreased RIGHT pleural effusion and basilar atelectasis. Residual small LEFT pleural effusion and basilar atelectasis. Interstitial infiltrate consistent with pulmonary edema/CHF. Atherosclerotic calcification aorta. No pneumothorax following thoracentesis. Osseous demineralization. IMPRESSION: Mild CHF with decreased RIGHT pleural effusion and basilar atelectasis post thoracentesis. No pneumothorax following RIGHT thoracentesis. Electronically Signed   By: Lavonia Dana M.D.   On: 11/24/2021 13:20   US THORACENTESIS ASP PLEURAL SPACE W/IMG GUIDE  Result Date: 11/24/2021 INDICATION: RIGHT pleural effusion EXAM: ULTRASOUND GUIDED DIAGNOSTIC AND THERAPEUTIC RIGHT spine THORACENTESIS MEDICATIONS: None. COMPLICATIONS: None immediate. PROCEDURE: An ultrasound guided thoracentesis was thoroughly discussed with the patient and questions answered. The benefits, risks, alternatives and complications were also discussed. The patient understands and wishes to proceed with the procedure. Written consent was obtained. Ultrasound was performed to localize and mark an adequate pocket of fluid in the right chest. The area was then prepped and draped in the normal sterile fashion. 1% Lidocaine was used for local anesthesia. Under ultrasound guidance a 6 Fr Safe-T-Centesis catheter was  introduced. Thoracentesis was performed. The catheter was removed and a dressing applied. FINDINGS: A total of approximately 1.6 L of slightly cloudy yellow RIGHT pleural fluid was removed. Samples were sent to the laboratory as requested by the clinical team. IMPRESSION: Successful ultrasound guided RIGHT thoracentesis yielding 1.6 L of pleural fluid. Electronically Signed   By: Lavonia Dana M.D.   On: 11/24/2021 13:19   ECHOCARDIOGRAM COMPLETE  Result Date: 11/24/2021    ECHOCARDIOGRAM REPORT   Patient Name:   Bradley SCHLABACH Date of Exam: 11/24/2021 Medical Rec #:  [id number]        Height:       72.0 in Accession #:    [id number]       Weight:       226.9 lb Date of Birth:  08-16-1940        BSA:          2.248 m Patient Age:    70 years         BP:           141/59 mmHg Patient Gender: M                HR:           62 bpm. Exam Location:  [facility] Procedure: 2D Echo, Cardiac Doppler and Color Doppler Indications:    I33.9 SBE  History:        Patient has prior history of Echocardiogram examinations, most                 recent 07/09/2020. CHF, CAD and Previous Myocardial Infarction,                 Aortic Valve Disease, Signs/Symptoms:Murmur; Risk                 Factors:Hypertension, Dyslipidemia, Former Smoker and Diabetes.                 Hx of CKD.  Sonographer:    Alvino Chapel  RCS Referring Phys: [id number] SEYED A SHAHMEHDI IMPRESSIONS  1. Left ventricular ejection fraction, by estimation, is 65 to 70%. The left ventricle has normal function. The left ventricle has no regional wall motion abnormalities. There is moderate concentric left ventricular hypertrophy. Left ventricular diastolic parameters are consistent with Grade II diastolic dysfunction (pseudonormalization). Elevated left ventricular end-diastolic pressure.  2. Right ventricular systolic function is normal. The right ventricular size is normal. Tricuspid regurgitation signal is inadequate for assessing PA pressure.  3. Left atrial size was  severely dilated.  4. Right atrial size was moderately dilated.  5. A small pericardial effusion is present. The pericardial effusion is posterior to the left ventricle and anterior to the right ventricle. There is no evidence of cardiac tamponade.  6. The mitral valve is degenerative. Trivial mitral valve regurgitation.  7. The aortic valve is tricuspid with restricted motion of the left coronary cusp. There is moderate calcification of the aortic valve. Moderate aortic valve stenosis. Aortic valve mean gradient measures 18.0 mmHg. Dimentionless index 0.44.  8. The inferior vena cava is dilated in size with >50% respiratory variability, suggesting right atrial pressure of 8 mmHg. Comparison(s): Prior images reviewed side by side. LVEF vigorous with moderate diastolic dysfunction and increased LV filling pressure. Moderate aortic stenosis, mean gradient has increased from 13 mmHg to 18 mmHg. FINDINGS  Left Ventricle: Left ventricular ejection fraction, by estimation, is 65 to 70%. The left ventricle has normal function. The left ventricle has no regional wall motion abnormalities. The left ventricular internal cavity size was normal in size. There is  moderate concentric left ventricular hypertrophy. Left ventricular diastolic parameters are consistent with Grade II diastolic dysfunction (pseudonormalization). Elevated left ventricular end-diastolic pressure. Right Ventricle: The right ventricular size is normal. No increase in right ventricular wall thickness. Right ventricular systolic function is normal. Tricuspid regurgitation signal is inadequate for assessing PA pressure. Left Atrium: Left atrial size was severely dilated. Right Atrium: Right atrial size was moderately dilated. Pericardium: A small pericardial effusion is present. The pericardial effusion is posterior to the left ventricle and anterior to the right ventricle. There is no evidence of cardiac tamponade. Mitral Valve: The mitral valve is  degenerative in appearance. There is mild thickening of the mitral valve leaflet(s). There is mild calcification of the mitral valve leaflet(s). Trivial mitral valve regurgitation. Tricuspid Valve: The tricuspid valve is grossly normal. Tricuspid valve regurgitation is trivial. Aortic Valve: The aortic valve is tricuspid. There is moderate calcification of the aortic valve. There is mild to moderate aortic valve annular calcification. Aortic valve regurgitation is not visualized. Moderate aortic stenosis is present. Aortic valve mean gradient measures 18.0 mmHg. Aortic valve peak gradient measures 33.4 mmHg. Aortic valve area, by VTI measures 1.37 cm. Pulmonic Valve: The pulmonic valve was grossly normal. Pulmonic valve regurgitation is trivial. Aorta: The aortic root is normal in size and structure. Venous: The inferior vena cava is dilated in size with greater than 50% respiratory variability, suggesting right atrial pressure of 8 mmHg. IAS/Shunts: No atrial level shunt detected by color flow Doppler.  LEFT VENTRICLE PLAX 2D LVIDd:         4.40 cm   Diastology LVIDs:         2.70 cm   LV e' medial:    7.29 cm/s LV PW:         1.50 cm   LV E/e' medial:  18.9 LV IVS:        1.70 cm   LV e' lateral:  6.74 cm/s LVOT diam:     2.00 cm   LV E/e' lateral: 20.5 LV SV:         92 LV SV Index:   41 LVOT Area:     3.14 cm  RIGHT VENTRICLE RV S prime:     15.80 cm/s TAPSE (M-mode): 2.8 cm LEFT ATRIUM              Index        RIGHT ATRIUM           Index LA diam:        4.30 cm  1.91 cm/m   RA Area:     26.20 cm LA Vol (A2C):   99.3 ml  44.18 ml/m  RA Volume:   90.00 ml  40.04 ml/m LA Vol (A4C):   138.0 ml 61.39 ml/m LA Biplane Vol: 119.0 ml 52.94 ml/m  AORTIC VALVE AV Area (Vmax):    1.33 cm AV Area (Vmean):   1.35 cm AV Area (VTI):     1.37 cm AV Vmax:           289.00 cm/s AV Vmean:          199.000 cm/s AV VTI:            0.669 m AV Peak Grad:      33.4 mmHg AV Mean Grad:      18.0 mmHg LVOT Vmax:          122.33 cm/s LVOT Vmean:        85.400 cm/s LVOT VTI:          0.292 m LVOT/AV VTI ratio: 0.44  AORTA Ao Root diam: 3.70 cm MITRAL VALVE MV Area (PHT): 3.03 cm     SHUNTS MV Decel Time: 250 msec     Systemic VTI:  0.29 m MV E velocity: 138.00 cm/s  Systemic Diam: 2.00 cm MV A velocity: 77.10 cm/s MV E/A ratio:  1.79 Rozann Lesches MD Electronically signed by Rozann Lesches MD Signature Date/Time: 11/24/2021/11:51:56 AM    Final    DG CHEST PORT 1 VIEW  Result Date: 11/24/2021 CLINICAL DATA:  Worsening shortness of breath with exertion. EXAM: PORTABLE CHEST 1 VIEW COMPARISON:  Chest radiograph 1 day prior FINDINGS: The cardiomediastinal silhouette is stable. Moderate right larger than left pleural effusions are again seen with patchy opacities in the lower lobes, right worse than left. There is vascular congestion and mild pulmonary interstitial edema. Overall, aeration appears slightly worsened in the right lower lobe compared to the study from 1 day prior. There is no pneumothorax The bones are stable. IMPRESSION: Moderate right larger than left pleural effusions with adjacent opacities in the lower lobes, worsened in the right base compared to the study from 1 day prior. Electronically Signed   By: Valetta Mole M.D.   On: 11/24/2021 08:16   CT Angio Chest PE W and/or Wo Contrast  Result Date: 11/23/2021 CLINICAL DATA:  History of MALT lymphoma. Exertional dyspnea. Clinical concern for pulmonary embolus. EXAM: CT ANGIOGRAPHY CHEST WITH CONTRAST TECHNIQUE: Multidetector CT imaging of the chest was performed using the standard protocol during bolus administration of intravenous contrast. Multiplanar CT image reconstructions and MIPs were obtained to evaluate the vascular anatomy. RADIATION DOSE REDUCTION: This exam was performed according to the departmental dose-optimization program which includes automated exposure control, adjustment of the mA and/or kV according to patient size and/or use of iterative  reconstruction technique. CONTRAST:  68m OMNIPAQUE IOHEXOL 350 MG/ML SOLN COMPARISON:  PET-CT 08/28/2021 FINDINGS: Cardiovascular: Heart is enlarged. Coronary artery calcification is evident. Mild atherosclerotic calcification is noted in the wall of the thoracic aorta. There is no filling defect within the opacified pulmonary arteries to suggest the presence of an acute pulmonary embolus. Mediastinum/Nodes: No mediastinal lymphadenopathy. There is no hilar lymphadenopathy. The esophagus has normal imaging features. There is no axillary lymphadenopathy. Lungs/Pleura: Fine architectural lung detail obscured by substantial breathing motion during image acquisition. Probable component of underlying emphysema. There is collapse/consolidation in both lower lobes with superimposed patchy bilateral ground-glass opacities. Moderate right and small to moderate left pleural effusions evident. Upper Abdomen: Unremarkable. Musculoskeletal: No worrisome lytic or sclerotic osseous abnormality. Review of the MIP images confirms the above findings. IMPRESSION: 1. No CT evidence for acute pulmonary embolus. 2. Moderate right and small to moderate left pleural effusions with collapse/consolidation in both lower lobes. 3. Patchy bilateral ground-glass opacities, compatible with edema and/or infection. 4. Aortic Atherosclerosis (ICD10-I70.0). Electronically Signed   By: Misty Stanley M.D.   On: 11/23/2021 08:59   DG Chest 2 View  Result Date: 11/23/2021 CLINICAL DATA:  Shortness of breath. EXAM: CHEST - 2 VIEW COMPARISON:  11/23/2021 FINDINGS: Stable cardiac enlargement. Aortic atherosclerotic calcifications. Small bilateral pleural effusions noted, right greater than left. Mild diffuse interstitial edema. No airspace consolidation. IMPRESSION: 1. Moderate congestive heart failure. Electronically Signed   By: Kerby Moors M.D.   On: 11/23/2021 07:08   MR PELVIS WO CM RECTAL CA STAGING  Result Date: 11/10/2021 CLINICAL DATA:   Rectal carcinoma.  Local staging. EXAM: MRI PELVIS WITHOUT CONTRAST TECHNIQUE: Multiplanar multisequence MR imaging of the pelvis was performed. No intravenous contrast was administered. Rectal ultrasound gel could not be administered as it was not tolerated by the patient. COMPARISON:  None Available. FINDINGS: TUMOR LOCATION Evaluation limited by lack of rectal distention by ultrasound gel. Tumor distance from Anal Verge/Skin Surface:  3.0 cm Tumor distance to Internal Anal Sphincter: 0 cm; tumor does contact the internal sphincter TUMOR DESCRIPTION Circumferential Extent: 100% Tumor Length: 3.0 cm T - CATEGORY Extension through Muscularis Propria: No= T1/T2 Shortest Distance of any tumor/node from Mesorectal Fascia: N/a Extramural Vascular Invasion/Tumor Thrombus: No Invasion of Anterior Peritoneal Reflection: No Involvement of Adjacent Organs or Pelvic Sidewall: No Levator Ani Involvement: No N - CATEGORY Mesorectal Lymph Nodes >=91m: None=N0 Extra-mesorectal Lymphadenopathy: 2 mildly enlarged left external iliac lymph nodes are seen which measure 1.3 cm and 1.6 cm in maximum diameter. Other:  Sigmoid diverticulosis, without evidence of diverticulitis. 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 Distance from tumor to the internal anal sphincter is 0 cm. Electronically Signed   By: JMarlaine HindM.D.   On: 11/10/2021 09:22    DOrson Eva DO  [facility]  If 7PM-7AM, please contact night-coverage [email or web address] Password [email or web address] 11/28/2021, 4:11 PM   LOS: 5 days

## 2021-11-29 ENCOUNTER — Inpatient Hospital Stay (HOSPITAL_COMMUNITY): Payer: PPO

## 2021-11-29 DIAGNOSIS — K6289 Other specified diseases of anus and rectum: Secondary | ICD-10-CM | POA: Diagnosis not present

## 2021-11-29 DIAGNOSIS — E782 Mixed hyperlipidemia: Secondary | ICD-10-CM | POA: Diagnosis not present

## 2021-11-29 DIAGNOSIS — L8990 Pressure ulcer of unspecified site, unspecified stage: Secondary | ICD-10-CM | POA: Insufficient documentation

## 2021-11-29 DIAGNOSIS — C884 Extranodal marginal zone B-cell lymphoma of mucosa-associated lymphoid tissue [MALT-lymphoma]: Secondary | ICD-10-CM | POA: Diagnosis not present

## 2021-11-29 DIAGNOSIS — J90 Pleural effusion, not elsewhere classified: Secondary | ICD-10-CM | POA: Diagnosis not present

## 2021-11-29 LAB — URINALYSIS, COMPLETE (UACMP) WITH MICROSCOPIC
Bilirubin Urine: NEGATIVE
Glucose, UA: NEGATIVE mg/dL
Hgb urine dipstick: NEGATIVE
Ketones, ur: NEGATIVE mg/dL
Nitrite: NEGATIVE
Protein, ur: NEGATIVE mg/dL
Specific Gravity, Urine: 1.012 (ref 1.005–1.030)
pH: 5 (ref 5.0–8.0)

## 2021-11-29 LAB — CBC
HCT: 26.9 % — ABNORMAL LOW (ref 39.0–52.0)
Hemoglobin: 8.9 g/dL — ABNORMAL LOW (ref 13.0–17.0)
MCH: 31.6 pg (ref 26.0–34.0)
MCHC: 33.1 g/dL (ref 30.0–36.0)
MCV: 95.4 fL (ref 80.0–100.0)
Platelets: 212 10*3/uL (ref 150–400)
RBC: 2.82 MIL/uL — ABNORMAL LOW (ref 4.22–5.81)
RDW: 12.9 % (ref 11.5–15.5)
WBC: 4.5 10*3/uL (ref 4.0–10.5)
nRBC: 0 % (ref 0.0–0.2)

## 2021-11-29 LAB — GLUCOSE, CAPILLARY
Glucose-Capillary: 125 mg/dL — ABNORMAL HIGH (ref 70–99)
Glucose-Capillary: 163 mg/dL — ABNORMAL HIGH (ref 70–99)
Glucose-Capillary: 173 mg/dL — ABNORMAL HIGH (ref 70–99)
Glucose-Capillary: 206 mg/dL — ABNORMAL HIGH (ref 70–99)

## 2021-11-29 LAB — PROCALCITONIN: Procalcitonin: 1 ng/mL

## 2021-11-29 LAB — MRSA NEXT GEN BY PCR, NASAL: MRSA by PCR Next Gen: NOT DETECTED

## 2021-11-29 LAB — CULTURE, BODY FLUID W GRAM STAIN -BOTTLE: Culture: NO GROWTH

## 2021-11-29 LAB — LACTIC ACID, PLASMA: Lactic Acid, Venous: 0.8 mmol/L (ref 0.5–1.9)

## 2021-11-29 MED ORDER — CARVEDILOL 3.125 MG PO TABS
6.2500 mg | ORAL_TABLET | Freq: Two times a day (BID) | ORAL | Status: DC
  Administered 2021-11-30 – 2021-12-01 (×3): 6.25 mg via ORAL
  Filled 2021-11-29 (×4): qty 2

## 2021-11-29 MED ORDER — ACETAMINOPHEN 500 MG PO TABS
1000.0000 mg | ORAL_TABLET | Freq: Four times a day (QID) | ORAL | Status: DC
  Administered 2021-11-29 – 2021-12-01 (×8): 1000 mg via ORAL
  Filled 2021-11-29 (×8): qty 2

## 2021-11-29 NOTE — Progress Notes (Addendum)
Went back to room to update patient on CT chest results 1. Decreased, RIGHT-sided pleural effusion with persistent small sub pulmonic component over the RIGHT hemidiaphragm and improved aeration at the RIGHT lung base when compared to previous imaging. 2. Persistent near complete collapse of LEFT lower lobe and moderate size sub pulmonic and dependent LEFT-sided pleural effusion.  PCT 1.00 Lactate 0.8  Tried to call spouse--left message with grandson Updated patient's son and grand daughter  Plan remains to continue antibiotics, follow cultures and clinical response  Shanon Brow Alexy Heldt, DO

## 2021-11-29 NOTE — Progress Notes (Signed)
HS BP 122/59 and HR 56-61.  Discussed with Dr. Clearence Ped and will hold tonight's dose of Coreg. Ayesha Mohair BSN RN [place] 11/29/2021, 11:07 PM

## 2021-11-29 NOTE — Plan of Care (Signed)
Pt alert and oriented x 4. Wife at bedside. Pt had temp of 101.2 at beginning of shift. Tylenol given and temp improved to 99. SCD placed and legs elevated decrease in edema noted. Foam changed to sacrum.  Problem: Education: Goal: Understanding of discharge needs will improve 11/29/2021 0651 by Nicholes Calamity, RN Outcome: Progressing  Goal: Verbalization of understanding of the causes of altered bowel function will improve 11/29/2021 0651 by Nicholes Calamity, RN Outcome: Progressing    Problem: Activity: Goal: Ability to tolerate increased activity will improve 11/29/2021 0651 by Nicholes Calamity, RN Outcome: Progressing    Problem: Bowel/Gastric: Goal: Gastrointestinal status for postoperative course will improve 11/29/2021 0651 by Nicholes Calamity, RN Outcome: Progressing  Problem: Health Behavior/Discharge Planning: Goal: Identification of community resources to assist with postoperative recovery needs will improve 11/29/2021 0651 by Nicholes Calamity, RN Outcome: Progressing    Problem: Nutritional: Goal: Will attain and maintain optimal nutritional status will improve 11/29/2021 0651 by Nicholes Calamity, RN Outcome: Progressing    Problem: Clinical Measurements: Goal: Postoperative complications will be avoided or minimized 11/29/2021 0651 by Nicholes Calamity, RN Outcome: Progressing    Problem: Respiratory: Goal: Respiratory status will improve 11/29/2021 0651 by Nicholes Calamity, RN Outcome: Progressing    Problem: Skin Integrity: Goal: Will show signs of wound healing 11/29/2021 0651 by Nicholes Calamity, RN Outcome: Progressing  Problem: Education: Goal: Knowledge of General Education information will improve Description: Including pain rating scale, medication(s)/side effects and non-pharmacologic comfort measures 11/29/2021 0651 by Nicholes Calamity, RN Outcome: Progressing    Problem: Health Behavior/Discharge Planning: Goal: Ability to manage  health-related needs will improve 11/29/2021 0651 by Nicholes Calamity, RN Outcome: Progressing    Problem: Clinical Measurements: Goal: Ability to maintain clinical measurements within normal limits will improve 11/29/2021 0651 by Nicholes Calamity, RN Outcome: Progressing  Goal: Will remain free from infection 11/29/2021 0651 by Nicholes Calamity, RN Outcome: Progressing  Goal: Diagnostic test results will improve 11/29/2021 0651 by Nicholes Calamity, RN Outcome: Progressing  Goal: Respiratory complications will improve 11/29/2021 0651 by Nicholes Calamity, RN Outcome: Progressing  Goal: Cardiovascular complication will be avoided 11/29/2021 1610 by Nicholes Calamity, RN Outcome: Progressing    Problem: Activity: Goal: Risk for activity intolerance will decrease 11/29/2021 0651 by Nicholes Calamity, RN Outcome: Progressing    Problem: Nutrition: Goal: Adequate nutrition will be maintained 11/29/2021 0651 by Nicholes Calamity, RN Outcome: Progressing  Problem: Coping: Goal: Level of anxiety will decrease 11/29/2021 0651 by Nicholes Calamity, RN Outcome: Progressing    Problem: Elimination: Goal: Will not experience complications related to bowel motility 11/29/2021 0651 by Nicholes Calamity, RN Outcome: Progressing  Goal: Will not experience complications related to urinary retention 11/29/2021 0651 by Nicholes Calamity, RN Outcome: Progressing    Problem: Pain Managment: Goal: General experience of comfort will improve 11/29/2021 0651 by Nicholes Calamity, RN Outcome: Progressing    Problem: Safety: Goal: Ability to remain free from injury will improve 11/29/2021 0651 by Nicholes Calamity, RN Outcome: Progressing    Problem: Skin Integrity: Goal: Risk for impaired skin integrity will decrease 11/29/2021 0651 by Nicholes Calamity, RN Outcome: Progressing    Problem: Education: Goal: Ability to describe self-care measures that may prevent or decrease complications  (Diabetes Survival Skills Education) will improve 11/29/2021 0651 by Nicholes Calamity, RN Outcome: Progressing  Goal: Individualized Educational Video(s) 11/29/2021 0651 by Nicholes Calamity, RN Outcome: Progressing  Problem: Coping: Goal: Ability to adjust to condition or change in health will improve 11/29/2021 0651 by Nicholes Calamity, RN Outcome: Progressing    Problem: Fluid Volume: Goal: Ability to maintain a balanced intake and output will improve 11/29/2021 0651 by Nicholes Calamity, RN Outcome: Progressing    Problem: Health Behavior/Discharge Planning: Goal: Ability to identify and utilize available resources and services will improve 11/29/2021 0651 by Nicholes Calamity, RN Outcome: Progressing  Goal: Ability to manage health-related needs will improve 11/29/2021 0651 by Nicholes Calamity, RN Outcome: Progressing  Problem: Metabolic: Goal: Ability to maintain appropriate glucose levels will improve 11/29/2021 0651 by Nicholes Calamity, RN Outcome: Progressing    Problem: Nutritional: Goal: Maintenance of adequate nutrition will improve 11/29/2021 0651 by Nicholes Calamity, RN Outcome: Progressing  Goal: Progress toward achieving an optimal weight will improve 11/29/2021 0651 by Nicholes Calamity, RN Outcome: Progressing  Problem: Skin Integrity: Goal: Risk for impaired skin integrity will decrease 11/29/2021 0651 by Nicholes Calamity, RN Outcome: Progressing    Problem: Tissue Perfusion: Goal: Adequacy of tissue perfusion will improve 11/29/2021 0651 by Nicholes Calamity, RN Outcome: Progressing

## 2021-11-29 NOTE — Progress Notes (Signed)
PROGRESS NOTE  MAE CIANCI RSW:[id number] DOB: October 04, 1940 DOA: 11/23/2021 PCP: [name], MD  Brief History:  Bradley Sheppard is a 81 year old male with extensive history of DM 2, HTN, HLD,MALT lymphoma, CAD presented to ED with chief complaint of shortness of breath -Patient reports shortness of breath has been going on for the past week, progressively getting worse especially with exertion. He denies any chest pain or cough.  Denies of having any recent illnesses States that he has CAD but has not been diagnosed with CHF.  And he is not O2 dependent.    ED course: Blood pressure 133/71, pulse 70, temperature 98.2 F (36.8 C), temperature source Oral, resp. rate 20, height 6' (1.829 m), weight 99.8 kg, SpO2 97 % on 2 L  CBC WBC 9.5, hemoglobin 9.8, CMP within normal limits with exception of BUN 48, creatinine 1.65, calcium 8.6, troponin 82 glucose 220 BNP 308, Total iron 44 D-dimer 2.4,  Chest x-ray: Moderate congestive heart failure CTA: 1. No CT evidence for acute pulmonary embolus. 2. Moderate right and small to moderate left pleural effusions with collapse/consolidation in both lower lobes. 3. Patchy bilateral ground-glass opacities, compatible with edema and/or infection. 4. Aortic Atherosclerosis     Assessment/Plan: Acute HFpEF - BNP was elevated to 308 on admission and CXR showed moderate pleural effusions with CTA being negative for a PE. -7/24 Echo--EF 65-70%, G2DD -7/24 Right thora--1.6L removed -responded well to IV lasix -7/26 now on po lasix -appreciate cardiology -I/Os incomplete   Pleural Effusion -exudative by Light's criteria -7/24Right Thora--WBC 6234, LDH 171 (63 N, 8 Mono) -cytology negative for malignant cells -fluid cultures neg -appreciate pulm consult -PET scan 08/2021 did not show any evidence of pleural hypermetabolism, so malignancy seems unlikely -follow-up with serial imaging to see if effusion recurs -7/23 CTA chest  neg for PE 7/27 CXR--personally reviewed--left pleural effusion>>request thora 7/27--Left thora--900 cc removed (WBC 11.5K) 7/28--discussed with Dr. 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 -Dr. 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 -Family requested Dr. Raliegh Ip to be consulted -appreciate input   HTN -continue coreg,  -amlodipine discontinued due to soft BP   HLD - Followed by his PCP as an outpatient. Remains on Crestor '20mg'$  daily.   CAD - prior stents in 2003 and most recent ischemic evaluation was in 2014 as outlined above. Repeat echo this admission shows a preserved EF with no regional WMA. He was previously having chest pain with coughing but no specific angina. No plans for further ischemic testing this admission. - Remains on ASA '81mg'$  daily, Coreg 18.'75mg'$  BID and Crestor '20mg'$  daily.    Aortic Stenosis - Moderate by repeat echo this admission. Continue to follow as an outpatient.    Abdominal pain -CT abd/pelvis--neg for acute findings   Diabetes mellitus type 2 with hyperglycemia 11/23/21 A1C--7.6 Novolog sliding scale   Family Communication:   spouse, son updated 7/29   Consultants:  pulm  Code Status:  FULL    DVT Prophylaxis:  [place]bert Heparin      Procedures: As Listed in Progress Note Above   Antibiotics: Unasyn 7/28>>          Subjective: Patient complains of LLQ, LUQ abdominal pain.   Denies f/c, n/v/d, sob, cp.   Objective: Vitals:   11/28/21 2200 11/29/21 0411 11/29/21 0500 11/29/21 0859  BP:  106/74  130/60  Pulse:  78  70  Resp:  18    Temp: 99 F (37.2 C) 99.4 F (37.4 C)    TempSrc: Oral     SpO2:  93%    Weight:   101.1 kg   Height:        Intake/Output Summary (Last 24 hours) at 11/29/2021 1029 Last data filed at 11/29/2021 0500 Gross per 24 hour  Intake 440 ml  Output 2200 ml  Net -1760 ml   Weight change: 4.166 kg Exam:  General:  Pt is alert, follows commands appropriately, not in acute distress HEENT: No icterus, No thrush, No neck mass, [place]/AT Cardiovascular: RRR, S1/S2, no rubs, no gallops Respiratory: bibasilar rales. No wheeze Abdomen: Soft/+BS, non tender, non distended, no guarding Extremities: 1 + LE edema, No lymphangitis, No petechiae, No rashes, no synovitis   Data Reviewed: I have personally reviewed following labs and imaging studies Basic Metabolic Panel: Recent Labs  Lab 11/23/21 0640 11/24/21 0453 11/25/21 0552 11/26/21 0602 11/26/21 1751 11/27/21 0605 11/28/21 0553  NA 137   < > 137 137 134* 137 137  K 4.5   < > 3.9 3.9 4.3 3.7 3.9  CL 111   < > 108 106 103 106 107  CO2 22   < > '23 22 24 24 24  '$ GLUCOSE 220*   < > 164* 149* 319* 173* 142*  BUN 48*   < > 61* 67* 70* 68* 70*  CREATININE 1.65*   < > 1.92* 1.99* 2.17* 1.97* 1.92*  CALCIUM 8.6*   < > 8.3* 8.3* 8.3* 8.3* 8.2*  MG 2.1  --   --   --   --   --  2.1  PHOS 3.9  --   --   --   --   --   --    < > = values in this interval not displayed.   Liver Function Tests: Recent Labs  Lab 11/25/21 0552  ALBUMIN 2.9*   No results for input(s): "LIPASE", "AMYLASE" in the last 168 hours. No results for input(s): "AMMONIA" in the last 168 hours. Coagulation Profile: Recent Labs  Lab 11/23/21 0640 11/24/21 0453  INR 1.1 1.1   CBC: Recent Labs  Lab 11/23/21 0640 11/24/21 0453 11/25/21 0552 11/26/21 0602 11/27/21 0605 11/28/21 0553 11/29/21 0716  WBC 9.5    < > 6.5 6.4 6.6 6.2 4.5  NEUTROABS 6.6  --   --   --   --   --   --   HGB 9.8*   < > 8.4* 8.6* 8.3* 8.4* 8.9*  HCT 30.1*   < > 25.7* 26.7* 25.7* 25.6* 26.9*  MCV 96.8   < > 96.3 96.0 95.9 95.2 95.4  PLT 257   < > 209 210 231 215 212   < > = values in this interval not displayed.   Cardiac Enzymes: No results for input(s): "CKTOTAL", "CKMB", "CKMBINDEX", "TROPONINI" in the last 168 hours. BNP: Invalid input(s): "POCBNP" CBG: Recent Labs  Lab 11/28/21 6301 11/28/21 1108 11/28/21 1627 11/28/21 2113 11/29/21 6010  GLUCAP 135* 177* 226* 188* 125*   HbA1C: No results for input(s): "HGBA1C" in the last 72 hours. Urine analysis:    Component Value Date/Time   COLORURINE YELLOW 12/26/2008 0950   APPEARANCEUR CLEAR 12/26/2008 0950   LABSPEC 1.016 12/26/2008 0950   PHURINE 5.5 12/26/2008 0950   GLUCOSEU NEGATIVE 12/26/2008 0950   HGBUR NEGATIVE 12/26/2008 0950   BILIRUBINUR NEGATIVE 12/26/2008 0950   KETONESUR NEGATIVE 12/26/2008 0950   PROTEINUR NEGATIVE 12/26/2008 0950   UROBILINOGEN 0.2 12/26/2008 0950   NITRITE NEGATIVE 12/26/2008 0950   LEUKOCYTESUR  12/26/2008 0950    NEGATIVE MICROSCOPIC NOT DONE ON URINES WITH NEGATIVE PROTEIN, BLOOD, LEUKOCYTES, NITRITE, OR GLUCOSE <1000 mg/dL.   Sepsis Labs: '@LABRCNTIP'$ (procalcitonin:4,lacticidven:4) ) Recent Results (from the past 240 hour(s))  Gram stain     Status: None   Collection Time: 11/24/21 12:40 PM   Specimen: Pleura  Result Value Ref Range Status   Specimen Description PLEURAL  Final   Special Requests PLEURAL  Final   Gram Stain   Final    WBC PRESENT,BOTH PMN AND MONONUCLEAR NO ORGANISMS SEEN CYTOSPIN SMEAR Performed at [facility], [address], [place], [place] [address]    Report Status 11/24/2021 FINAL  Final  Culture, body fluid w Gram Stain-bottle     Status: None   Collection Time: 11/24/21 12:40 PM   Specimen: Pleura  Result Value Ref Range Status   Specimen Description PLEURAL  Final   Special  Requests   Final    BOTTLES DRAWN AEROBIC AND ANAEROBIC Blood Culture results may not be optimal due to an excessive volume of blood received in culture bottles   Culture   Final    NO GROWTH 5 DAYS Performed at [facility], [address], [place], [place] [address]    Report Status 11/29/2021 FINAL  Final  Gram stain     Status: None   Collection Time: 11/27/21  3:45 PM   Specimen: Pleura  Result Value Ref Range Status   Specimen Description PLEURAL LEFT  Final   Special Requests NONE  Final   Gram Stain   Final    NO ORGANISMS SEEN WBC PRESENT,BOTH PMN AND MONONUCLEAR CYTOSPIN SMEAR PERFORMED AT [facility] Performed at [facility], [address] [place], [place] [address]    Report Status 11/27/2021 FINAL  Final  Culture, body fluid w Gram Stain-bottle     Status: None (Preliminary result)   Collection Time: 11/27/21  3:45 PM   Specimen: Pleura  Result Value Ref Range Status   Specimen Description   Final    PLEURAL LEFT Performed at [facility], [address], [place], [place] [address]    Special Requests   Final    BOTTLES DRAWN AEROBIC AND ANAEROBIC Blood Culture adequate volume Performed at [facility], [place] [address] [place], [place] [address]    Culture   Final    NO GROWTH 2 DAYS Performed at [facility], [address] [place], [place] [address]    Report Status PENDING  Incomplete  Culture, blood (Routine X 2) w Reflex to ID Panel     Status: None (Preliminary result)   Collection Time: 11/29/21  7:03 AM   Specimen: Right Antecubital; Blood  Result Value Ref Range Status   Specimen Description   Final    RIGHT ANTECUBITAL BOTTLES DRAWN AEROBIC AND ANAEROBIC   Special Requests   Final    Blood Culture results may not be optimal due to an excessive volume  of blood received in culture bottles Performed at [facility], [address], [place], [place] [address]    Culture PENDING  Incomplete   Report Status PENDING  Incomplete  Culture, blood (Routine X  2) w Reflex to ID Panel     Status: None (Preliminary result)   Collection Time: 11/29/21  7:15 AM   Specimen: BLOOD RIGHT HAND  Result Value Ref Range Status   Specimen Description   Final    BLOOD RIGHT HAND BOTTLES DRAWN AEROBIC AND ANAEROBIC   Special Requests   Final    Blood Culture results may not be optimal due to an excessive volume of blood received in culture bottles Performed at [facility], [address] [place], [place] [address]    Culture PENDING  Incomplete   Report Status PENDING  Incomplete     Scheduled Meds:  acetaminophen  1,000 mg Oral Q6H   aspirin  81 mg Oral Daily   carvedilol  18.75 mg Oral BID WC   docusate sodium  100 mg Oral Daily   ferrous sulfate  325 mg Oral BID WC   furosemide  40 mg Oral Daily   heparin  5,000 Units Subcutaneous Q8H   insulin aspart  0-9 Units Subcutaneous TID WC   LORazepam  1 mg Oral QHS   polyethylene glycol  17 g Oral Daily   rosuvastatin  20 mg Oral Daily   sodium chloride flush  3 mL Intravenous Q12H   cyanocobalamin  500 mcg Oral Daily   Continuous Infusions:  ampicillin-sulbactam (UNASYN) IV 3 g (11/29/21 0525)    Procedures/Studies: CT ABDOMEN PELVIS WO CONTRAST  Result Date: 11/28/2021 CLINICAL DATA:  LEFT lower quadrant abdominal pain. EXAM: CT ABDOMEN AND PELVIS WITHOUT CONTRAST TECHNIQUE: Multidetector CT imaging of the abdomen and pelvis was performed following the standard protocol without IV contrast. RADIATION DOSE REDUCTION: This exam was performed according to the departmental dose-optimization program which includes automated exposure control, adjustment of the mA and/or kV according to patient size and/or use of iterative reconstruction technique. COMPARISON:  None Available. FINDINGS: Lower chest: Bilateral pleural effusions and dense bibasilar atelectasis. Decrease in RIGHT pleural effusion compared to CT 5 days prior. There is high-density within the periphery of the atelectatic lungs . Hepatobiliary: No  focal hepatic lesion. No biliary duct dilatation. Common bile duct is normal. Gallbladder normal Pancreas: Pancreas is normal. No ductal dilatation. No pancreatic inflammation. Spleen: Normal spleen Adrenals/urinary tract: Adrenal glands and kidneys are normal. The ureters and bladder normal. Stomach/Bowel: Stomach, small bowel, appendix, and cecum are normal. The colon and rectosigmoid colon are normal. Moderate volume stool in the colon. Vascular/Lymphatic: Abdominal aorta is normal caliber with atherosclerotic calcification. There is no retroperitoneal or periportal lymphadenopathy. No pelvic lymphadenopathy. Reproductive: Prostate unremarkable Other: No free fluid. Musculoskeletal: No aggressive osseous lesion. Scoliosis with disc osteophytic disease. IMPRESSION: 1. No explanation for LEFT lower quadrant pain. No diverticulitis or hernia. No ureterolithiasis or obstructive uropathy. 2. Bilateral pleural effusions and dense bibasilar atelectasis. Decrease in RIGHT pleural effusion. 3. High-density within the atelectatic lungs unchanged. Electronically Signed   By: Suzy Bouchard M.D.   On: 11/28/2021 14:36   DG Chest 1 View  Result Date: 11/27/2021 CLINICAL DATA:  LEFT pleural effusion post thoracentesis EXAM: CHEST  1 VIEW COMPARISON:  Repeat exam 1603 hours compared to 0947 hours FINDINGS: Enlargement of cardiac silhouette with slight vascular congestion. Atherosclerotic calcification aorta. Minimal bibasilar effusions and atelectasis slightly greater on LEFT. No pneumothorax following thoracentesis. Bones  demineralized with BILATERAL glenohumeral degenerative changes and skin dextroconvex scoliosis thoracic spine. IMPRESSION: No pneumothorax following LEFT thoracentesis. Otherwise no change. Electronically Signed   By: Lavonia Dana M.D.   On: 11/27/2021 16:44   US THORACENTESIS ASP PLEURAL SPACE W/IMG GUIDE  Result Date: 11/27/2021 INDICATION: LEFT pleural effusion, LEFT chest pain EXAM: ULTRASOUND  GUIDED DIAGNOSTIC AND THERAPEUTIC LEFT THORACENTESIS MEDICATIONS: None. COMPLICATIONS: None immediate. PROCEDURE: An ultrasound guided thoracentesis was thoroughly discussed with the patient and questions answered. The benefits, risks, alternatives and complications were also discussed. The patient understands and wishes to proceed with the procedure. Written consent was obtained. Ultrasound was performed to localize and mark an adequate pocket of fluid in the LEFT chest. The area was then prepped and draped in the normal sterile fashion. 1% Lidocaine was used for local anesthesia. Under ultrasound guidance a 6 Fr Safe-T-Centesis catheter was introduced. Thoracentesis was performed. The catheter was removed and a dressing applied. FINDINGS: A total of approximately 900 mL of yellow fluid was removed. Samples were sent to the laboratory as requested by the clinical team. IMPRESSION: Successful ultrasound guided LEFT thoracentesis yielding 900 mL of pleural fluid. Electronically Signed   By: Lavonia Dana M.D.   On: 11/27/2021 16:40   DG CHEST PORT 1 VIEW  Result Date: 11/27/2021 CLINICAL DATA:  Pleural effusion. EXAM: PORTABLE CHEST 1 VIEW COMPARISON:  One-view chest x-ray 11/24/2021 FINDINGS: Heart is enlarged. Lung volumes are low. Bilateral pleural effusions are noted, left greater than right. The left pleural effusion has increased. Asymmetric left lower lobe airspace disease is present. Degenerative changes are noted at the shoulders. IMPRESSION: 1. Cardiomegaly without failure. 2. Bilateral pleural effusions, left greater than right. 3. Asymmetric left lower lobe airspace disease likely reflects atelectasis. Infection is not excluded. Electronically Signed   By: San Morelle M.D.   On: 11/27/2021 09:59   DG Chest 1 View  Result Date: 11/24/2021 CLINICAL DATA:  RIGHT pleural effusion post thoracentesis EXAM: CHEST  1 VIEW COMPARISON:  11/24/2021 FINDINGS: Enlargement of cardiac silhouette with  pulmonary vascular congestion. Markedly decreased RIGHT pleural effusion and basilar atelectasis. Residual small LEFT pleural effusion and basilar atelectasis. Interstitial infiltrate consistent with pulmonary edema/CHF. Atherosclerotic calcification aorta. No pneumothorax following thoracentesis. Osseous demineralization. IMPRESSION: Mild CHF with decreased RIGHT pleural effusion and basilar atelectasis post thoracentesis. No pneumothorax following RIGHT thoracentesis. Electronically Signed   By: Lavonia Dana M.D.   On: 11/24/2021 13:20   US THORACENTESIS ASP PLEURAL SPACE W/IMG GUIDE  Result Date: 11/24/2021 INDICATION: RIGHT pleural effusion EXAM: ULTRASOUND GUIDED DIAGNOSTIC AND THERAPEUTIC RIGHT spine THORACENTESIS MEDICATIONS: None. COMPLICATIONS: None immediate. PROCEDURE: An ultrasound guided thoracentesis was thoroughly discussed with the patient and questions answered. The benefits, risks, alternatives and complications were also discussed. The patient understands and wishes to proceed with the procedure. Written consent was obtained. Ultrasound was performed to localize and mark an adequate pocket of fluid in the right chest. The area was then prepped and draped in the normal sterile fashion. 1% Lidocaine was used for local anesthesia. Under ultrasound guidance a 6 Fr Safe-T-Centesis catheter was introduced. Thoracentesis was performed. The catheter was removed and a dressing applied. FINDINGS: A total of approximately 1.6 L of slightly cloudy yellow RIGHT pleural fluid was removed. Samples were sent to the laboratory as requested by the clinical team. IMPRESSION: Successful ultrasound guided RIGHT thoracentesis yielding 1.6 L of pleural fluid. Electronically Signed   By: Lavonia Dana M.D.   On: 11/24/2021 13:19   ECHOCARDIOGRAM COMPLETE  Result Date: 11/24/2021    ECHOCARDIOGRAM REPORT   Patient Name:   Bradley Sheppard Date of Exam: 11/24/2021 Medical Rec #:  [id number]        Height:       72.0 in  Accession #:    [id number]       Weight:       226.9 lb Date of Birth:  [birth year]        BSA:          2.248 m Patient Age:    47 years         BP:           141/59 mmHg Patient Gender: M                HR:           62 bpm. Exam Location:  [facility] Procedure: 2D Echo, Cardiac Doppler and Color Doppler Indications:    I33.9 SBE  History:        Patient has prior history of Echocardiogram examinations, most                 recent 07/09/2020. CHF, CAD and Previous Myocardial Infarction,                 Aortic Valve Disease, Signs/Symptoms:Murmur; Risk                 Factors:Hypertension, Dyslipidemia, Former Smoker and Diabetes.                 Hx of CKD.  Sonographer:    Alvino Chapel RCS Referring Phys: [place]  1. Left ventricular ejection fraction, by estimation, is 65 to 70%. The left ventricle has normal function. The left ventricle has no regional wall motion abnormalities. There is moderate concentric left ventricular hypertrophy. Left ventricular diastolic parameters are consistent with Grade II diastolic dysfunction (pseudonormalization). Elevated left ventricular end-diastolic pressure.  2. Right ventricular systolic function is normal. The right ventricular size is normal. Tricuspid regurgitation signal is inadequate for assessing PA pressure.  3. Left atrial size was severely dilated.  4. Right atrial size was moderately dilated.  5. A small pericardial effusion is present. The pericardial effusion is posterior to the left ventricle and anterior to the right ventricle. There is no evidence of cardiac tamponade.  6. The mitral valve is degenerative. Trivial mitral valve regurgitation.  7. The aortic valve is tricuspid with restricted motion of the left coronary cusp. There is moderate calcification of the aortic valve. Moderate aortic valve stenosis. Aortic valve mean gradient measures 18.0 mmHg. Dimentionless index 0.44.  8. The inferior vena cava is dilated in size with  >50% respiratory variability, suggesting right atrial pressure of 8 mmHg. Comparison(s): Prior images reviewed side by side. LVEF vigorous with moderate diastolic dysfunction and increased LV filling pressure. Moderate aortic stenosis, mean gradient has increased from 13 mmHg to 18 mmHg. FINDINGS  Left Ventricle: Left ventricular ejection fraction, by estimation, is 65 to 70%. The left ventricle has normal function. The left ventricle has no regional wall motion abnormalities. The left ventricular internal cavity size was normal in size. There is  moderate concentric left ventricular hypertrophy. Left ventricular diastolic parameters are consistent with Grade II diastolic dysfunction (pseudonormalization). Elevated left ventricular end-diastolic pressure. Right Ventricle: The right ventricular size is normal. No increase in right ventricular wall thickness. Right ventricular systolic function is normal. Tricuspid regurgitation signal is inadequate for assessing PA pressure. Left  Atrium: Left atrial size was severely dilated. Right Atrium: Right atrial size was moderately dilated. Pericardium: A small pericardial effusion is present. The pericardial effusion is posterior to the left ventricle and anterior to the right ventricle. There is no evidence of cardiac tamponade. Mitral Valve: The mitral valve is degenerative in appearance. There is mild thickening of the mitral valve leaflet(s). There is mild calcification of the mitral valve leaflet(s). Trivial mitral valve regurgitation. Tricuspid Valve: The tricuspid valve is grossly normal. Tricuspid valve regurgitation is trivial. Aortic Valve: The aortic valve is tricuspid. There is moderate calcification of the aortic valve. There is mild to moderate aortic valve annular calcification. Aortic valve regurgitation is not visualized. Moderate aortic stenosis is present. Aortic valve mean gradient measures 18.0 mmHg. Aortic valve peak gradient measures 33.4 mmHg. Aortic  valve area, by VTI measures 1.37 cm. Pulmonic Valve: The pulmonic valve was grossly normal. Pulmonic valve regurgitation is trivial. Aorta: The aortic root is normal in size and structure. Venous: The inferior vena cava is dilated in size with greater than 50% respiratory variability, suggesting right atrial pressure of 8 mmHg. IAS/Shunts: No atrial level shunt detected by color flow Doppler.  LEFT VENTRICLE PLAX 2D LVIDd:         4.40 cm   Diastology LVIDs:         2.70 cm   LV e' medial:    7.29 cm/s LV PW:         1.50 cm   LV E/e' medial:  18.9 LV IVS:        1.70 cm   LV e' lateral:   6.74 cm/s LVOT diam:     2.00 cm   LV E/e' lateral: 20.5 LV SV:         92 LV SV Index:   41 LVOT Area:     3.14 cm  RIGHT VENTRICLE RV S prime:     15.80 cm/s TAPSE (M-mode): 2.8 cm LEFT ATRIUM              Index        RIGHT ATRIUM           Index LA diam:        4.30 cm  1.91 cm/m   RA Area:     26.20 cm LA Vol (A2C):   99.3 ml  44.18 ml/m  RA Volume:   90.00 ml  40.04 ml/m LA Vol (A4C):   138.0 ml 61.39 ml/m LA Biplane Vol: 119.0 ml 52.94 ml/m  AORTIC VALVE AV Area (Vmax):    1.33 cm AV Area (Vmean):   1.35 cm AV Area (VTI):     1.37 cm AV Vmax:           289.00 cm/s AV Vmean:          199.000 cm/s AV VTI:            0.669 m AV Peak Grad:      33.4 mmHg AV Mean Grad:      18.0 mmHg LVOT Vmax:         122.33 cm/s LVOT Vmean:        85.400 cm/s LVOT VTI:          0.292 m LVOT/AV VTI ratio: 0.44  AORTA Ao Root diam: 3.70 cm MITRAL VALVE MV Area (PHT): 3.03 cm     SHUNTS MV Decel Time: 250 msec     Systemic VTI:  0.29 m MV E velocity: 138.00 cm/s  Systemic  Diam: 2.00 cm MV A velocity: 77.10 cm/s MV E/A ratio:  1.79 Rozann Lesches MD Electronically signed by Rozann Lesches MD Signature Date/Time: 11/24/2021/11:51:56 AM    Final    DG CHEST PORT 1 VIEW  Result Date: 11/24/2021 CLINICAL DATA:  Worsening shortness of breath with exertion. EXAM: PORTABLE CHEST 1 VIEW COMPARISON:  Chest radiograph 1 day prior FINDINGS:  The cardiomediastinal silhouette is stable. Moderate right larger than left pleural effusions are again seen with patchy opacities in the lower lobes, right worse than left. There is vascular congestion and mild pulmonary interstitial edema. Overall, aeration appears slightly worsened in the right lower lobe compared to the study from 1 day prior. There is no pneumothorax The bones are stable. IMPRESSION: Moderate right larger than left pleural effusions with adjacent opacities in the lower lobes, worsened in the right base compared to the study from 1 day prior. Electronically Signed   By: Valetta Mole M.D.   On: 11/24/2021 08:16   CT Angio Chest PE W and/or Wo Contrast  Result Date: 11/23/2021 CLINICAL DATA:  History of MALT lymphoma. Exertional dyspnea. Clinical concern for pulmonary embolus. EXAM: CT ANGIOGRAPHY CHEST WITH CONTRAST TECHNIQUE: Multidetector CT imaging of the chest was performed using the standard protocol during bolus administration of intravenous contrast. Multiplanar CT image reconstructions and MIPs were obtained to evaluate the vascular anatomy. RADIATION DOSE REDUCTION: This exam was performed according to the departmental dose-optimization program which includes automated exposure control, adjustment of the mA and/or kV according to patient size and/or use of iterative reconstruction technique. CONTRAST:  34m OMNIPAQUE IOHEXOL 350 MG/ML SOLN COMPARISON:  PET-CT 08/28/2021 FINDINGS: Cardiovascular: Heart is enlarged. Coronary artery calcification is evident. Mild atherosclerotic calcification is noted in the wall of the thoracic aorta. There is no filling defect within the opacified pulmonary arteries to suggest the presence of an acute pulmonary embolus. Mediastinum/Nodes: No mediastinal lymphadenopathy. There is no hilar lymphadenopathy. The esophagus has normal imaging features. There is no axillary lymphadenopathy. Lungs/Pleura: Fine architectural lung detail obscured by substantial  breathing motion during image acquisition. Probable component of underlying emphysema. There is collapse/consolidation in both lower lobes with superimposed patchy bilateral ground-glass opacities. Moderate right and small to moderate left pleural effusions evident. Upper Abdomen: Unremarkable. Musculoskeletal: No worrisome lytic or sclerotic osseous abnormality. Review of the MIP images confirms the above findings. IMPRESSION: 1. No CT evidence for acute pulmonary embolus. 2. Moderate right and small to moderate left pleural effusions with collapse/consolidation in both lower lobes. 3. Patchy bilateral ground-glass opacities, compatible with edema and/or infection. 4. Aortic Atherosclerosis (ICD10-I70.0). Electronically Signed   By: EMisty StanleyM.D.   On: 11/23/2021 08:59   DG Chest 2 View  Result Date: 11/23/2021 CLINICAL DATA:  Shortness of breath. EXAM: CHEST - 2 VIEW COMPARISON:  11/23/2021 FINDINGS: Stable cardiac enlargement. Aortic atherosclerotic calcifications. Small bilateral pleural effusions noted, right greater than left. Mild diffuse interstitial edema. No airspace consolidation. IMPRESSION: 1. Moderate congestive heart failure. Electronically Signed   By: TKerby MoorsM.D.   On: 11/23/2021 07:08   MR PELVIS WO CM RECTAL CA STAGING  Result Date: 11/10/2021 CLINICAL DATA:  Rectal carcinoma.  Local staging. EXAM: MRI PELVIS WITHOUT CONTRAST TECHNIQUE: Multiplanar multisequence MR imaging of the pelvis was performed. No intravenous contrast was administered. Rectal ultrasound gel could not be administered as it was not tolerated by the patient. COMPARISON:  None Available. FINDINGS: TUMOR LOCATION Evaluation limited by lack of rectal distention by ultrasound gel. Tumor distance from Anal  Verge/Skin Surface:  3.0 cm Tumor distance to Internal Anal Sphincter: 0 cm; tumor does contact the internal sphincter TUMOR DESCRIPTION Circumferential Extent: 100% Tumor Length: 3.0 cm T - CATEGORY  Extension through Muscularis Propria: No= T1/T2 Shortest Distance of any tumor/node from Mesorectal Fascia: N/a Extramural Vascular Invasion/Tumor Thrombus: No Invasion of Anterior Peritoneal Reflection: No Involvement of Adjacent Organs or Pelvic Sidewall: No Levator Ani Involvement: No N - CATEGORY Mesorectal Lymph Nodes >=51m: None=N0 Extra-mesorectal Lymphadenopathy: 2 mildly enlarged left external iliac lymph nodes are seen which measure 1.3 cm and 1.6 cm in maximum diameter. Other:  Sigmoid diverticulosis, without evidence of diverticulitis. 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 Distance from tumor to the internal anal sphincter is 0 cm. Electronically Signed   By: JMarlaine HindM.D.   On: 11/10/2021 09:22    DOrson Eva DO  [facility]  If 7PM-7AM, please contact night-coverage [email or web address] Password [email or web address] 11/29/2021, 10:29 AM   LOS: 6 days

## 2021-11-30 DIAGNOSIS — J90 Pleural effusion, not elsewhere classified: Secondary | ICD-10-CM | POA: Diagnosis not present

## 2021-11-30 DIAGNOSIS — I5031 Acute diastolic (congestive) heart failure: Secondary | ICD-10-CM | POA: Diagnosis not present

## 2021-11-30 DIAGNOSIS — N1832 Chronic kidney disease, stage 3b: Secondary | ICD-10-CM | POA: Diagnosis not present

## 2021-11-30 DIAGNOSIS — I5033 Acute on chronic diastolic (congestive) heart failure: Secondary | ICD-10-CM | POA: Diagnosis not present

## 2021-11-30 LAB — COMPREHENSIVE METABOLIC PANEL
ALT: 91 U/L — ABNORMAL HIGH (ref 0–44)
AST: 120 U/L — ABNORMAL HIGH (ref 15–41)
Albumin: 2.3 g/dL — ABNORMAL LOW (ref 3.5–5.0)
Alkaline Phosphatase: 113 U/L (ref 38–126)
Anion gap: 7 (ref 5–15)
BUN: 58 mg/dL — ABNORMAL HIGH (ref 8–23)
CO2: 25 mmol/L (ref 22–32)
Calcium: 8.2 mg/dL — ABNORMAL LOW (ref 8.9–10.3)
Chloride: 106 mmol/L (ref 98–111)
Creatinine, Ser: 1.77 mg/dL — ABNORMAL HIGH (ref 0.61–1.24)
GFR, Estimated: 38 mL/min — ABNORMAL LOW (ref >60)
Glucose, Bld: 109 mg/dL — ABNORMAL HIGH (ref 70–99)
Potassium: 4.1 mmol/L (ref 3.5–5.1)
Sodium: 138 mmol/L (ref 135–145)
Total Bilirubin: 0.7 mg/dL (ref 0.3–1.2)
Total Protein: 6.2 g/dL — ABNORMAL LOW (ref 6.5–8.1)

## 2021-11-30 LAB — CBC
HCT: 27.3 % — ABNORMAL LOW (ref 39.0–52.0)
Hemoglobin: 8.8 g/dL — ABNORMAL LOW (ref 13.0–17.0)
MCH: 30.7 pg (ref 26.0–34.0)
MCHC: 32.2 g/dL (ref 30.0–36.0)
MCV: 95.1 fL (ref 80.0–100.0)
Platelets: 232 10*3/uL (ref 150–400)
RBC: 2.87 MIL/uL — ABNORMAL LOW (ref 4.22–5.81)
RDW: 13 % (ref 11.5–15.5)
WBC: 4.7 10*3/uL (ref 4.0–10.5)
nRBC: 0 % (ref 0.0–0.2)

## 2021-11-30 LAB — GLUCOSE, CAPILLARY
Glucose-Capillary: 126 mg/dL — ABNORMAL HIGH (ref 70–99)
Glucose-Capillary: 204 mg/dL — ABNORMAL HIGH (ref 70–99)
Glucose-Capillary: 191 mg/dL — ABNORMAL HIGH (ref 70–99)
Glucose-Capillary: 202 mg/dL — ABNORMAL HIGH (ref 70–99)

## 2021-11-30 LAB — PROCALCITONIN: Procalcitonin: 1.15 ng/mL

## 2021-11-30 NOTE — Progress Notes (Signed)
Pt noted to have diffuse rash to back. Not painful or blistering. Wife at bedside ans said it was not his baseline. Pt had just finished IV infusion not long before. Dr Tat informed via epic chat and he said he will assess when he sees the patient.

## 2021-11-30 NOTE — Progress Notes (Signed)
PROGRESS NOTE  Bradley Sheppard VQQ:[id number] DOB: [birth year] DOA: 11/23/2021 PCP: [name], MD  Brief History:  Bradley Sheppard is a 81 year old male with extensive history of DM 2, HTN, HLD,MALT lymphoma, CAD presented to ED with chief complaint of shortness of breath -Patient reports shortness of breath has been going on for the past week, progressively getting worse especially with exertion. He denies any chest pain or cough.  Denies of having any recent illnesses States that he has CAD but has not been diagnosed with CHF.  And he is not O2 dependent.    ED course: Blood pressure 133/71, pulse 70, temperature 98.2 F (36.8 C), temperature source Oral, resp. rate 20, height 6' (1.829 m), weight 99.8 kg, SpO2 97 % on 2 L  CBC WBC 9.5, hemoglobin 9.8, CMP within normal limits with exception of BUN 48, creatinine 1.65, calcium 8.6, troponin 82 glucose 220 BNP 308, Total iron 44 D-dimer 2.4,  Chest x-ray: Moderate congestive heart failure CTA: 1. No CT evidence for acute pulmonary embolus. 2. Moderate right and small to moderate left pleural effusions with collapse/consolidation in both lower lobes. 3. Patchy bilateral ground-glass opacities, compatible with edema and/or infection. 4. Aortic Atherosclerosis  Patient underwent Right thoracocentesis  (1.6L) and was started on IV lasix.  His breathing improved and he was weaned to RA.  He subsequently had left thora on 7/27 which removed 900 cc.  His discharge was delayed due development of abd pain.  CT of abd/pelvis was negative for acute findings.  Unfortunately, his discharge was delayed again due to development of fever up to 101.2 on the evening of 7/28.  He was started on Unasyn after discussion with pulmonary for high WBCs in his pleural fluid.  Pleural fluid cultures remained negative.  Blood cultures were obtained.   Assessment/Plan: Acute HFpEF - BNP was elevated to 308 on admission and CXR showed moderate  pleural effusions with CTA being negative for a PE. -7/24 Echo--EF 65-70%, G2DD -7/24 Right thora--1.6L removed -responded well to IV lasix -7/26 now on po lasix -appreciate cardiology -I/Os incomplete   Pleural Effusion -exudative by Light's criteria -7/24Right Thora--WBC 6234, LDH 171 (63 N, 8 Mono) -cytology negative for malignant cells -fluid cultures neg -appreciate pulm consult -PET scan 08/2021 did not show any evidence of pleural hypermetabolism, so malignancy seems unlikely -follow-up with serial imaging to see if effusion recurs -7/23 CTA chest neg for PE 7/27 CXR--personally reviewed--left pleural effusion>>request thora 7/27--Left thora--900 cc removed (WBC 11.5K) 7/28--discussed with Dr. 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 -Dr. 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 -Family requested Dr. Raliegh Ip to be consulted -appreciate input   HTN -continue coreg>>dose decreased -amlodipine discontinued due to soft BP   HLD - Followed by his  PCP as an outpatient. Remains on Crestor '20mg'$  daily.   CAD - prior stents in 2003 and most recent ischemic evaluation was in 2014 as outlined  above. Repeat echo this admission shows a preserved EF with no regional WMA. He was previously having chest pain with coughing but no specific angina. No plans for further ischemic testing this admission. - Remains on ASA '81mg'$  daily, Coreg 18.'75mg'$  BID and Crestor '20mg'$  daily.    Aortic Stenosis - Moderate by repeat echo this admission. Continue to follow as an outpatient.    Abdominal pain -CT abd/pelvis--neg for acute findings -suspect combination of constipation and Early injections   Diabetes mellitus type 2 with hyperglycemia 11/23/21 A1C--7.6 Novolog sliding scale   Family Communication:   spouse, son, daughter updated 7/30   Consultants:  pulm   Code Status:  FULL    DVT Prophylaxis:  [place] Heparin      Procedures: As Listed in Progress Note Above   Antibiotics: Unasyn 7/28>>         Subjective: Patient denies fevers, chills, headache, chest pain, dyspnea, nausea, vomiting, diarrhea, abdominal pain, dysuria, hematuria, hematochezia, and melena.   Objective: Vitals:   11/30/21 0438 11/30/21 0500 11/30/21 0823 11/30/21 1357  BP: (!) 143/71  (!) 137/58 128/61  Pulse: 70  72 66  Resp: '16  17 17  '$ Temp: 97.6 F (36.4 C)  98 F (36.7 C) 97.8 F (36.6 C)  TempSrc:   Oral Oral  SpO2: 94%  96% 98%  Weight:  96.8 kg    Height:        Intake/Output Summary (Last 24 hours) at 11/30/2021 1525 Last data filed at 11/30/2021 1401 Gross per 24 hour  Intake 1760 ml  Output 2800 ml  Net -1040 ml   Weight change: -4.3 kg Exam:  General:  Pt is alert, follows commands appropriately, not in acute distress HEENT: No icterus, No thrush, No neck mass, [name]/AT Cardiovascular: RRR, S1/S2, no rubs, no gallops Respiratory: bibasilar crackles. No wheeze Abdomen: Soft/+BS, non tender, non distended, no guarding Extremities:  trace LE edema, No lymphangitis, No petechiae, No rashes, no synovitis   Data Reviewed: I have personally reviewed following labs and imaging studies Basic  Metabolic Panel: Recent Labs  Lab 11/26/21 0602 11/26/21 1751 11/27/21 0605 11/28/21 0553 11/30/21 0530  NA 137 134* 137 137 138  K 3.9 4.3 3.7 3.9 4.1  CL 106 103 106 107 106  CO2 '22 24 24 24 25  '$ GLUCOSE 149* 319* 173* 142* 109*  BUN 67* 70* 68* 70* 58*  CREATININE 1.99* 2.17* 1.97* 1.92* 1.77*  CALCIUM 8.3* 8.3* 8.3* 8.2* 8.2*  MG  --   --   --  2.1  --    Liver Function Tests: Recent Labs  Lab 11/25/21 0552 11/30/21 0530  AST  --  120*  ALT  --  91*  ALKPHOS  --  113  BILITOT  --  0.7  PROT  --  6.2*  ALBUMIN 2.9* 2.3*   No results for input(s): "LIPASE", "AMYLASE" in the last 168 hours. No results for input(s): "AMMONIA" in the last 168 hours. Coagulation Profile: Recent Labs  Lab 11/24/21 0453  INR 1.1   CBC: Recent Labs  Lab 11/26/21 0602 11/27/21 0605 11/28/21 0553 11/29/21 0716 11/30/21 0530  WBC 6.4 6.6 6.2 4.5 4.7  HGB 8.6* 8.3* 8.4* 8.9* 8.8*  HCT 26.7* 25.7* 25.6* 26.9* 27.3*  MCV 96.0 95.9 95.2 95.4 95.1  PLT 210 231 215 212 232  Cardiac Enzymes: No results for input(s): "CKTOTAL", "CKMB", "CKMBINDEX", "TROPONINI" in the last 168 hours. BNP: Invalid input(s): "POCBNP" CBG: Recent Labs  Lab 11/29/21 1114 11/29/21 1648 11/29/21 2054 11/30/21 0718 11/30/21 1111  GLUCAP 163* 173* 206* 126* 204*   HbA1C: No results for input(s): "HGBA1C" in the last 72 hours. Urine analysis:    Component Value Date/Time   COLORURINE YELLOW 11/29/2021 1149   APPEARANCEUR HAZY (A) 11/29/2021 1149   LABSPEC 1.012 11/29/2021 1149   PHURINE 5.0 11/29/2021 1149   GLUCOSEU NEGATIVE 11/29/2021 1149   HGBUR NEGATIVE 11/29/2021 1149   BILIRUBINUR NEGATIVE 11/29/2021 1149   KETONESUR NEGATIVE 11/29/2021 1149   PROTEINUR NEGATIVE 11/29/2021 1149   UROBILINOGEN 0.2 12/26/2008 0950   NITRITE NEGATIVE 11/29/2021 1149   LEUKOCYTESUR SMALL (A) 11/29/2021 1149   Sepsis Labs: '@LABRCNTIP'$ (procalcitonin:4,lacticidven:4) ) Recent Results (from the past 240  hour(s))  Gram stain     Status: None   Collection Time: 11/24/21 12:40 PM   Specimen: Pleura  Result Value Ref Range Status   Specimen Description PLEURAL  Final   Special Requests PLEURAL  Final   Gram Stain   Final    WBC PRESENT,BOTH PMN AND MONONUCLEAR NO ORGANISMS SEEN CYTOSPIN SMEAR Performed at [facility], [address], [place], [place] [address]    Report Status 11/24/2021 FINAL  Final  Culture, body fluid w Gram Stain-bottle     Status: None   Collection Time: 11/24/21 12:40 PM   Specimen: Pleura  Result Value Ref Range Status   Specimen Description PLEURAL  Final   Special Requests   Final    BOTTLES DRAWN AEROBIC AND ANAEROBIC Blood Culture results may not be optimal due to an excessive volume of blood received in culture bottles   Culture   Final    NO GROWTH 5 DAYS Performed at [facility], [address] [place], [place] [address]    Report Status 11/29/2021 FINAL  Final  Gram stain     Status: None   Collection Time: 11/27/21  3:45 PM   Specimen: Pleura  Result Value Ref Range Status   Specimen Description PLEURAL LEFT  Final   Special Requests NONE  Final   Gram Stain   Final    NO ORGANISMS SEEN WBC PRESENT,BOTH PMN AND MONONUCLEAR CYTOSPIN SMEAR PERFORMED AT [facility] Performed at [facility], [address] [place], [place] [address]    Report Status 11/27/2021 FINAL  Final  Culture, body fluid w Gram Stain-bottle     Status: None (Preliminary result)   Collection Time: 11/27/21  3:45 PM   Specimen: Pleura  Result Value Ref Range Status   Specimen Description   Final    PLEURAL LEFT Performed at [facility], [address], [place], [place] [address]    Special Requests   Final    BOTTLES DRAWN AEROBIC AND ANAEROBIC Blood Culture adequate volume Performed at [facility], [place] [address], [place], [place] [address]    Culture   Final    NO GROWTH 3 DAYS Performed at [facility], [address] [place], [place] [address]    Report Status  PENDING  Incomplete  Culture, blood (Routine X 2) w Reflex to ID Panel     Status: None (Preliminary result)   Collection Time: 11/29/21  7:03 AM   Specimen: Right Antecubital; Blood  Result Value Ref Range Status   Specimen Description   Final    RIGHT ANTECUBITAL BOTTLES DRAWN AEROBIC AND ANAEROBIC   Special  Requests   Final    Blood Culture results may not be optimal due to an excessive volume of blood received in culture bottles   Culture   Final    NO GROWTH < 24 HOURS Performed at [facility], [address] [place], [place] [address]    Report Status PENDING  Incomplete  Culture, blood (Routine X 2) w Reflex to ID Panel     Status: None (Preliminary result)   Collection Time: 11/29/21  7:15 AM   Specimen: BLOOD RIGHT HAND  Result Value Ref Range Status   Specimen Description   Final    BLOOD RIGHT HAND BOTTLES DRAWN AEROBIC AND ANAEROBIC   Special Requests   Final    Blood Culture results may not be optimal due to an excessive volume of blood received in culture bottles   Culture   Final    NO GROWTH < 24 HOURS Performed at [facility], [address], [place], [place] [address]    Report Status PENDING  Incomplete  MRSA Next Gen by PCR, Nasal     Status: None   Collection Time: 11/29/21  1:17 PM   Specimen: Nasal Mucosa; Nasal Swab  Result Value Ref Range Status   MRSA by PCR Next Gen NOT DETECTED NOT DETECTED Final    Comment: (NOTE) The GeneXpert MRSA Assay (FDA approved for NASAL specimens only), is one component of a comprehensive MRSA colonization surveillance program. It is not intended to diagnose MRSA infection nor to guide or monitor treatment for MRSA infections. Test performance is not FDA approved in patients less than 72 years old. Performed at [facility], [place] [address], [place], [place] [address]      Scheduled Meds:  acetaminophen  1,000 mg Oral Q6H   aspirin  81 mg Oral Daily   carvedilol  6.25 mg Oral BID WC   docusate sodium  100 mg  Oral Daily   ferrous sulfate  325 mg Oral BID WC   furosemide  40 mg Oral Daily   heparin  5,000 Units Subcutaneous Q8H   insulin aspart  0-9 Units Subcutaneous TID WC   LORazepam  1 mg Oral QHS   polyethylene glycol  17 g Oral Daily   sodium chloride flush  3 mL Intravenous Q12H   cyanocobalamin  500 mcg Oral Daily   Continuous Infusions:  ampicillin-sulbactam (UNASYN) IV Stopped (11/30/21 1131)    Procedures/Studies: CT CHEST WO CONTRAST  Result Date: 11/29/2021 CLINICAL DATA:  History of pleural effusion in a patient with multiple lymph [place]. * Tracking Code: BO * EXAM: CT CHEST WITHOUT CONTRAST TECHNIQUE: Multidetector CT imaging of the chest was performed following the standard protocol without IV contrast. RADIATION DOSE REDUCTION: This exam was performed according to the departmental dose-optimization program which includes automated exposure control, adjustment of the mA and/or kV according to patient size and/or use of iterative reconstruction technique. COMPARISON:  November 23, 2021. FINDINGS: Cardiovascular: Calcified aortic atherosclerosis. Calcified coronary artery disease, three-vessel disease. Stable heart size with signs of small pericardial effusion. Heart size moderately enlarged. Mediastinum/Nodes: No signs of adenopathy in the mediastinum or in the LEFT or RIGHT hilum. No internal mammary adenopathy. No axillary lymphadenopathy. No thoracic inlet lymphadenopathy. Lungs/Pleura: Subpleural reticulation in the chest bilaterally. Calcified parenchymal changes at the lung bases with similar appearance. Decreased, RIGHT-sided pleural effusion with persistent small sub pulmonic component over the RIGHT hemidiaphragm and improved aeration at the RIGHT lung base when compared to previous imaging. Persistent near  complete collapse of LEFT lower lobe and moderate size sub pulmonic and dependent LEFT-sided pleural effusion. Airways are patent. Upper Abdomen: Incidental imaging of upper abdominal  contents with unremarkable appearance of imaged portions the liver, pancreas, spleen, adrenal glands and gastrointestinal tract to the extent evaluated. Kidneys with cortical scarring are incompletely evaluated. Musculoskeletal: No sign of acute musculoskeletal finding. Degenerative changes of the glenohumeral joints and of the spine. Spinal curvature with levoconvex curvature at the thoracolumbar transition with similar appearance to prior imaging. IMPRESSION: 1. Decreased, RIGHT-sided pleural effusion with persistent small sub pulmonic component over the RIGHT hemidiaphragm and improved aeration at the RIGHT lung base when compared to previous imaging. 2. Persistent near complete collapse of LEFT lower lobe and moderate size sub pulmonic and dependent LEFT-sided pleural effusion. 3. Parenchymal calcifications of lung parenchyma in collapsed lung with stippled calcifications. No visible discrete lesion. 4. Three-vessel coronary artery disease. 5. Small pericardial effusion. 6. Aortic atherosclerosis. Aortic Atherosclerosis (ICD10-I70.0). Electronically Signed   By: Zetta Bills M.D.   On: 11/29/2021 13:55   CT ABDOMEN PELVIS WO CONTRAST  Result Date: 11/28/2021 CLINICAL DATA:  LEFT lower quadrant abdominal pain. EXAM: CT ABDOMEN AND PELVIS WITHOUT CONTRAST TECHNIQUE: Multidetector CT imaging of the abdomen and pelvis was performed following the standard protocol without IV contrast. RADIATION DOSE REDUCTION: This exam was performed according to the departmental dose-optimization program which includes automated exposure control, adjustment of the mA and/or kV according to patient size and/or use of iterative reconstruction technique. COMPARISON:  None Available. FINDINGS: Lower chest: Bilateral pleural effusions and dense bibasilar atelectasis. Decrease in RIGHT pleural effusion compared to CT 5 days prior. There is high-density within the periphery of the atelectatic lungs . Hepatobiliary: No focal hepatic  lesion. No biliary duct dilatation. Common bile duct is normal. Gallbladder normal Pancreas: Pancreas is normal. No ductal dilatation. No pancreatic inflammation. Spleen: Normal spleen Adrenals/urinary tract: Adrenal glands and kidneys are normal. The ureters and bladder normal. Stomach/Bowel: Stomach, small bowel, appendix, and cecum are normal. The colon and rectosigmoid colon are normal. Moderate volume stool in the colon. Vascular/Lymphatic: Abdominal aorta is normal caliber with atherosclerotic calcification. There is no retroperitoneal or periportal lymphadenopathy. No pelvic lymphadenopathy. Reproductive: Prostate unremarkable Other: No free fluid. Musculoskeletal: No aggressive osseous lesion. Scoliosis with disc osteophytic disease. IMPRESSION: 1. No explanation for LEFT lower quadrant pain. No diverticulitis or hernia. No ureterolithiasis or obstructive uropathy. 2. Bilateral pleural effusions and dense bibasilar atelectasis. Decrease in RIGHT pleural effusion. 3. High-density within the atelectatic lungs unchanged. Electronically Signed   By: Suzy Bouchard M.D.   On: 11/28/2021 14:36   DG Chest 1 View  Result Date: 11/27/2021 CLINICAL DATA:  LEFT pleural effusion post thoracentesis EXAM: CHEST  1 VIEW COMPARISON:  Repeat exam 1603 hours compared to 0947 hours FINDINGS: Enlargement of cardiac silhouette with slight vascular congestion. Atherosclerotic calcification aorta. Minimal bibasilar effusions and atelectasis slightly greater on LEFT. No pneumothorax following thoracentesis. Bones demineralized with BILATERAL glenohumeral degenerative changes and skin dextroconvex scoliosis thoracic spine. IMPRESSION: No pneumothorax following LEFT thoracentesis. Otherwise no change. Electronically Signed   By: Lavonia Dana M.D.   On: 11/27/2021 16:44   US THORACENTESIS ASP PLEURAL SPACE W/IMG GUIDE  Result Date: 11/27/2021 INDICATION: LEFT pleural effusion, LEFT chest pain EXAM: ULTRASOUND GUIDED  DIAGNOSTIC AND THERAPEUTIC LEFT THORACENTESIS MEDICATIONS: None. COMPLICATIONS: None immediate. PROCEDURE: An ultrasound guided thoracentesis was thoroughly discussed with the patient and questions answered. The benefits, risks, alternatives and complications were also discussed. The patient understands  and wishes to proceed with the procedure. Written consent was obtained. Ultrasound was performed to localize and mark an adequate pocket of fluid in the LEFT chest. The area was then prepped and draped in the normal sterile fashion. 1% Lidocaine was used for local anesthesia. Under ultrasound guidance a 6 Fr Safe-T-Centesis catheter was introduced. Thoracentesis was performed. The catheter was removed and a dressing applied. FINDINGS: A total of approximately 900 mL of yellow fluid was removed. Samples were sent to the laboratory as requested by the clinical team. IMPRESSION: Successful ultrasound guided LEFT thoracentesis yielding 900 mL of pleural fluid. Electronically Signed   By: Lavonia Dana M.D.   On: 11/27/2021 16:40   DG CHEST PORT 1 VIEW  Result Date: 11/27/2021 CLINICAL DATA:  Pleural effusion. EXAM: PORTABLE CHEST 1 VIEW COMPARISON:  One-view chest x-ray 11/24/2021 FINDINGS: Heart is enlarged. Lung volumes are low. Bilateral pleural effusions are noted, left greater than right. The left pleural effusion has increased. Asymmetric left lower lobe airspace disease is present. Degenerative changes are noted at the shoulders. IMPRESSION: 1. Cardiomegaly without failure. 2. Bilateral pleural effusions, left greater than right. 3. Asymmetric left lower lobe airspace disease likely reflects atelectasis. Infection is not excluded. Electronically Signed   By: [place] Morelle M.D.   On: 11/27/2021 09:59   DG Chest 1 View  Result Date: 11/24/2021 CLINICAL DATA:  RIGHT pleural effusion post thoracentesis EXAM: CHEST  1 VIEW COMPARISON:  11/24/2021 FINDINGS: Enlargement of cardiac silhouette with pulmonary  vascular congestion. Markedly decreased RIGHT pleural effusion and basilar atelectasis. Residual small LEFT pleural effusion and basilar atelectasis. Interstitial infiltrate consistent with pulmonary edema/CHF. Atherosclerotic calcification aorta. No pneumothorax following thoracentesis. Osseous demineralization. IMPRESSION: Mild CHF with decreased RIGHT pleural effusion and basilar atelectasis post thoracentesis. No pneumothorax following RIGHT thoracentesis. Electronically Signed   By: Lavonia Dana M.D.   On: 11/24/2021 13:20   US THORACENTESIS ASP PLEURAL SPACE W/IMG GUIDE  Result Date: 11/24/2021 INDICATION: RIGHT pleural effusion EXAM: ULTRASOUND GUIDED DIAGNOSTIC AND THERAPEUTIC RIGHT spine THORACENTESIS MEDICATIONS: None. COMPLICATIONS: None immediate. PROCEDURE: An ultrasound guided thoracentesis was thoroughly discussed with the patient and questions answered. The benefits, risks, alternatives and complications were also discussed. The patient understands and wishes to proceed with the procedure. Written consent was obtained. Ultrasound was performed to localize and mark an adequate pocket of fluid in the right chest. The area was then prepped and draped in the normal sterile fashion. 1% Lidocaine was used for local anesthesia. Under ultrasound guidance a 6 Fr Safe-T-Centesis catheter was introduced. Thoracentesis was performed. The catheter was removed and a dressing applied. FINDINGS: A total of approximately 1.6 L of slightly cloudy yellow RIGHT pleural fluid was removed. Samples were sent to the laboratory as requested by the clinical team. IMPRESSION: Successful ultrasound guided RIGHT thoracentesis yielding 1.6 L of pleural fluid. Electronically Signed   By: Lavonia Dana M.D.   On: 11/24/2021 13:19   ECHOCARDIOGRAM COMPLETE  Result Date: 11/24/2021    ECHOCARDIOGRAM REPORT   Patient Name:   JAYDENCE ARNESEN Date of Exam: 11/24/2021 Medical Rec #:  [id number]        Height:       72.0 in Accession  #:    [id number]       Weight:       226.9 lb Date of Birth:  1941/04/27        BSA:          2.248 m Patient Age:    24 years  BP:           141/59 mmHg Patient Gender: M                HR:           62 bpm. Exam Location:  [facility] Procedure: 2D Echo, Cardiac Doppler and Color Doppler Indications:    I33.9 SBE  History:        Patient has prior history of Echocardiogram examinations, most                 recent 07/09/2020. CHF, CAD and Previous Myocardial Infarction,                 Aortic Valve Disease, Signs/Symptoms:Murmur; Risk                 Factors:Hypertension, Dyslipidemia, Former Smoker and Diabetes.                 Hx of CKD.  Sonographer:    Alvino Chapel RCS Referring Phys: [place]  1. Left ventricular ejection fraction, by estimation, is 65 to 70%. The left ventricle has normal function. The left ventricle has no regional wall motion abnormalities. There is moderate concentric left ventricular hypertrophy. Left ventricular diastolic parameters are consistent with Grade II diastolic dysfunction (pseudonormalization). Elevated left ventricular end-diastolic pressure.  2. Right ventricular systolic function is normal. The right ventricular size is normal. Tricuspid regurgitation signal is inadequate for assessing PA pressure.  3. Left atrial size was severely dilated.  4. Right atrial size was moderately dilated.  5. A small pericardial effusion is present. The pericardial effusion is posterior to the left ventricle and anterior to the right ventricle. There is no evidence of cardiac tamponade.  6. The mitral valve is degenerative. Trivial mitral valve regurgitation.  7. The aortic valve is tricuspid with restricted motion of the left coronary cusp. There is moderate calcification of the aortic valve. Moderate aortic valve stenosis. Aortic valve mean gradient measures 18.0 mmHg. Dimentionless index 0.44.  8. The inferior vena cava is dilated in size with >50%  respiratory variability, suggesting right atrial pressure of 8 mmHg. Comparison(s): Prior images reviewed side by side. LVEF vigorous with moderate diastolic dysfunction and increased LV filling pressure. Moderate aortic stenosis, mean gradient has increased from 13 mmHg to 18 mmHg. FINDINGS  Left Ventricle: Left ventricular ejection fraction, by estimation, is 65 to 70%. The left ventricle has normal function. The left ventricle has no regional wall motion abnormalities. The left ventricular internal cavity size was normal in size. There is  moderate concentric left ventricular hypertrophy. Left ventricular diastolic parameters are consistent with Grade II diastolic dysfunction (pseudonormalization). Elevated left ventricular end-diastolic pressure. Right Ventricle: The right ventricular size is normal. No increase in right ventricular wall thickness. Right ventricular systolic function is normal. Tricuspid regurgitation signal is inadequate for assessing PA pressure. Left Atrium: Left atrial size was severely dilated. Right Atrium: Right atrial size was moderately dilated. Pericardium: A small pericardial effusion is present. The pericardial effusion is posterior to the left ventricle and anterior to the right ventricle. There is no evidence of cardiac tamponade. Mitral Valve: The mitral valve is degenerative in appearance. There is mild thickening of the mitral valve leaflet(s). There is mild calcification of the mitral valve leaflet(s). Trivial mitral valve regurgitation. Tricuspid Valve: The tricuspid valve is grossly normal. Tricuspid valve regurgitation is trivial. Aortic Valve: The aortic valve is tricuspid. There is moderate calcification of the aortic valve.  There is mild to moderate aortic valve annular calcification. Aortic valve regurgitation is not visualized. Moderate aortic stenosis is present. Aortic valve mean gradient measures 18.0 mmHg. Aortic valve peak gradient measures 33.4 mmHg. Aortic valve  area, by VTI measures 1.37 cm. Pulmonic Valve: The pulmonic valve was grossly normal. Pulmonic valve regurgitation is trivial. Aorta: The aortic root is normal in size and structure. Venous: The inferior vena cava is dilated in size with greater than 50% respiratory variability, suggesting right atrial pressure of 8 mmHg. IAS/Shunts: No atrial level shunt detected by color flow Doppler.  LEFT VENTRICLE PLAX 2D LVIDd:         4.40 cm   Diastology LVIDs:         2.70 cm   LV e' medial:    7.29 cm/s LV PW:         1.50 cm   LV E/e' medial:  18.9 LV IVS:        1.70 cm   LV e' lateral:   6.74 cm/s LVOT diam:     2.00 cm   LV E/e' lateral: 20.5 LV SV:         92 LV SV Index:   41 LVOT Area:     3.14 cm  RIGHT VENTRICLE RV S prime:     15.80 cm/s TAPSE (M-mode): 2.8 cm LEFT ATRIUM              Index        RIGHT ATRIUM           Index LA diam:        4.30 cm  1.91 cm/m   RA Area:     26.20 cm LA Vol (A2C):   99.3 ml  44.18 ml/m  RA Volume:   90.00 ml  40.04 ml/m LA Vol (A4C):   138.0 ml 61.39 ml/m LA Biplane Vol: 119.0 ml 52.94 ml/m  AORTIC VALVE AV Area (Vmax):    1.33 cm AV Area (Vmean):   1.35 cm AV Area (VTI):     1.37 cm AV Vmax:           289.00 cm/s AV Vmean:          199.000 cm/s AV VTI:            0.669 m AV Peak Grad:      33.4 mmHg AV Mean Grad:      18.0 mmHg LVOT Vmax:         122.33 cm/s LVOT Vmean:        85.400 cm/s LVOT VTI:          0.292 m LVOT/AV VTI ratio: 0.44  AORTA Ao Root diam: 3.70 cm MITRAL VALVE MV Area (PHT): 3.03 cm     SHUNTS MV Decel Time: 250 msec     Systemic VTI:  0.29 m MV E velocity: 138.00 cm/s  Systemic Diam: 2.00 cm MV A velocity: 77.10 cm/s MV E/A ratio:  1.79 Rozann Lesches MD Electronically signed by Rozann Lesches MD Signature Date/Time: 11/24/2021/11:51:56 AM    Final    DG CHEST PORT 1 VIEW  Result Date: 11/24/2021 CLINICAL DATA:  Worsening shortness of breath with exertion. EXAM: PORTABLE CHEST 1 VIEW COMPARISON:  Chest radiograph 1 day prior FINDINGS: The  cardiomediastinal silhouette is stable. Moderate right larger than left pleural effusions are again seen with patchy opacities in the lower lobes, right worse than left. There is vascular congestion and mild pulmonary interstitial edema. Overall, aeration appears  slightly worsened in the right lower lobe compared to the study from 1 day prior. There is no pneumothorax The bones are stable. IMPRESSION: Moderate right larger than left pleural effusions with adjacent opacities in the lower lobes, worsened in the right base compared to the study from 1 day prior. Electronically Signed   By: Valetta Mole M.D.   On: 11/24/2021 08:16   CT Angio Chest PE W and/or Wo Contrast  Result Date: 11/23/2021 CLINICAL DATA:  History of MALT lymphoma. Exertional dyspnea. Clinical concern for pulmonary embolus. EXAM: CT ANGIOGRAPHY CHEST WITH CONTRAST TECHNIQUE: Multidetector CT imaging of the chest was performed using the standard protocol during bolus administration of intravenous contrast. Multiplanar CT image reconstructions and MIPs were obtained to evaluate the vascular anatomy. RADIATION DOSE REDUCTION: This exam was performed according to the departmental dose-optimization program which includes automated exposure control, adjustment of the mA and/or kV according to patient size and/or use of iterative reconstruction technique. CONTRAST:  35m OMNIPAQUE IOHEXOL 350 MG/ML SOLN COMPARISON:  PET-CT 08/28/2021 FINDINGS: Cardiovascular: Heart is enlarged. Coronary artery calcification is evident. Mild atherosclerotic calcification is noted in the wall of the thoracic aorta. There is no filling defect within the opacified pulmonary arteries to suggest the presence of an acute pulmonary embolus. Mediastinum/Nodes: No mediastinal lymphadenopathy. There is no hilar lymphadenopathy. The esophagus has normal imaging features. There is no axillary lymphadenopathy. Lungs/Pleura: Fine architectural lung detail obscured by substantial  breathing motion during image acquisition. Probable component of underlying emphysema. There is collapse/consolidation in both lower lobes with superimposed patchy bilateral ground-glass opacities. Moderate right and small to moderate left pleural effusions evident. Upper Abdomen: Unremarkable. Musculoskeletal: No worrisome lytic or sclerotic osseous abnormality. Review of the MIP images confirms the above findings. IMPRESSION: 1. No CT evidence for acute pulmonary embolus. 2. Moderate right and small to moderate left pleural effusions with collapse/consolidation in both lower lobes. 3. Patchy bilateral ground-glass opacities, compatible with edema and/or infection. 4. Aortic Atherosclerosis (ICD10-I70.0). Electronically Signed   By: EMisty StanleyM.D.   On: 11/23/2021 08:59   DG Chest 2 View  Result Date: 11/23/2021 CLINICAL DATA:  Shortness of breath. EXAM: CHEST - 2 VIEW COMPARISON:  11/23/2021 FINDINGS: Stable cardiac enlargement. Aortic atherosclerotic calcifications. Small bilateral pleural effusions noted, right greater than left. Mild diffuse interstitial edema. No airspace consolidation. IMPRESSION: 1. Moderate congestive heart failure. Electronically Signed   By: TKerby MoorsM.D.   On: 11/23/2021 07:08   MR PELVIS WO CM RECTAL CA STAGING  Result Date: 11/10/2021 CLINICAL DATA:  Rectal carcinoma.  Local staging. EXAM: MRI PELVIS WITHOUT CONTRAST TECHNIQUE: Multiplanar multisequence MR imaging of the pelvis was performed. No intravenous contrast was administered. Rectal ultrasound gel could not be administered as it was not tolerated by the patient. COMPARISON:  None Available. FINDINGS: TUMOR LOCATION Evaluation limited by lack of rectal distention by ultrasound gel. Tumor distance from Anal Verge/Skin Surface:  3.0 cm Tumor distance to Internal Anal Sphincter: 0 cm; tumor does contact the internal sphincter TUMOR DESCRIPTION Circumferential Extent: 100% Tumor Length: 3.0 cm T - CATEGORY  Extension through Muscularis Propria: No= T1/T2 Shortest Distance of any tumor/node from Mesorectal Fascia: N/a Extramural Vascular Invasion/Tumor Thrombus: No Invasion of Anterior Peritoneal Reflection: No Involvement of Adjacent Organs or Pelvic Sidewall: No Levator Ani Involvement: No N - CATEGORY Mesorectal Lymph Nodes >=577m None=N0 Extra-mesorectal Lymphadenopathy: 2 mildly enlarged left external iliac lymph nodes are seen which measure 1.3 cm and 1.6 cm in maximum diameter. Other:  Sigmoid  diverticulosis, without evidence of diverticulitis. 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 Distance from tumor to the internal anal sphincter is 0 cm. Electronically Signed   By: Marlaine Hind M.D.   On: 11/10/2021 09:22    Orson Eva, DO  [facility]  If 7PM-7AM, please contact night-coverage [email or web address] Password [email or web address] 11/30/2021, 3:25 PM   LOS: 7 days

## 2021-12-01 ENCOUNTER — Inpatient Hospital Stay (HOSPITAL_COMMUNITY): Payer: PPO

## 2021-12-01 DIAGNOSIS — J90 Pleural effusion, not elsewhere classified: Secondary | ICD-10-CM | POA: Diagnosis not present

## 2021-12-01 DIAGNOSIS — I5031 Acute diastolic (congestive) heart failure: Secondary | ICD-10-CM | POA: Diagnosis not present

## 2021-12-01 DIAGNOSIS — N1832 Chronic kidney disease, stage 3b: Secondary | ICD-10-CM | POA: Diagnosis not present

## 2021-12-01 DIAGNOSIS — C884 Extranodal marginal zone B-cell lymphoma of mucosa-associated lymphoid tissue [MALT-lymphoma]: Secondary | ICD-10-CM | POA: Diagnosis not present

## 2021-12-01 LAB — COMPREHENSIVE METABOLIC PANEL
ALT: 85 U/L — ABNORMAL HIGH (ref 0–44)
AST: 99 U/L — ABNORMAL HIGH (ref 15–41)
Albumin: 2.4 g/dL — ABNORMAL LOW (ref 3.5–5.0)
Alkaline Phosphatase: 142 U/L — ABNORMAL HIGH (ref 38–126)
Anion gap: 7 (ref 5–15)
BUN: 55 mg/dL — ABNORMAL HIGH (ref 8–23)
CO2: 27 mmol/L (ref 22–32)
Calcium: 8.3 mg/dL — ABNORMAL LOW (ref 8.9–10.3)
Chloride: 108 mmol/L (ref 98–111)
Creatinine, Ser: 1.69 mg/dL — ABNORMAL HIGH (ref 0.61–1.24)
GFR, Estimated: 40 mL/min — ABNORMAL LOW (ref >60)
Glucose, Bld: 153 mg/dL — ABNORMAL HIGH (ref 70–99)
Potassium: 3.9 mmol/L (ref 3.5–5.1)
Sodium: 142 mmol/L (ref 135–145)
Total Bilirubin: 0.6 mg/dL (ref 0.3–1.2)
Total Protein: 6.6 g/dL (ref 6.5–8.1)

## 2021-12-01 LAB — GLUCOSE, CAPILLARY
Glucose-Capillary: 160 mg/dL — ABNORMAL HIGH (ref 70–99)
Glucose-Capillary: 231 mg/dL — ABNORMAL HIGH (ref 70–99)

## 2021-12-01 LAB — URINE CULTURE: Culture: NO GROWTH

## 2021-12-01 LAB — HEPATITIS B SURFACE ANTIGEN: Hepatitis B Surface Ag: NONREACTIVE

## 2021-12-01 LAB — HEPATITIS C ANTIBODY: HCV Ab: NONREACTIVE

## 2021-12-01 LAB — CYTOLOGY - NON PAP

## 2021-12-01 MED ORDER — AMOXICILLIN-POT CLAVULANATE 875-125 MG PO TABS: 1.0000 | ORAL_TABLET | Freq: Two times a day (BID) | ORAL | 0 refills | Status: DC

## 2021-12-01 MED ORDER — FUROSEMIDE 40 MG PO TABS: 40.0000 mg | ORAL_TABLET | Freq: Every day | ORAL | 1 refills | Status: DC

## 2021-12-01 MED ORDER — AMOXICILLIN-POT CLAVULANATE 875-125 MG PO TABS: 1.0000 | ORAL_TABLET | Freq: Two times a day (BID) | ORAL | Status: DC

## 2021-12-01 MED ORDER — CARVEDILOL 12.5 MG PO TABS: 12.5000 mg | ORAL_TABLET | Freq: Two times a day (BID) | ORAL | 1 refills | Status: DC

## 2021-12-01 MED ORDER — CARVEDILOL 12.5 MG PO TABS: 12.5000 mg | ORAL_TABLET | Freq: Two times a day (BID) | ORAL | Status: DC

## 2021-12-01 NOTE — Care Management Important Message (Signed)
Important Message  Patient Details  Name: Bradley Sheppard MRN: [id number] Date of Birth: 23-Oct-1940   Medicare Important Message Given:  Yes     Tommy Medal 12/01/2021, 2:14 PM

## 2021-12-01 NOTE — Progress Notes (Signed)
Physical Therapy Treatment Patient Details Name: Bradley Sheppard MRN: [id number] DOB: 11/24/1940 Today's Date: 12/01/2021   History of Present Illness Bradley Sheppard is a 81 year old male with extensive history of DM 2, HTN, HLD,MALT lymphoma, CAD presented to ED with chief complaint of shortness of breath  -Patient reports shortness of breath has been going on for the past week, progressively getting worse especially with exertion.  He denies any chest pain or cough.  Denies of having any recent illnesses  States that he has CAD but has not been diagnosed with CHF.  And he is not O2 dependent.    PT Comments    Pt continues to fatigue quickly.  Complains of Lt thigh pain with SLR but not with ambulation.    Recommendations for follow up therapy are one component of a multi-disciplinary discharge planning process, led by the attending physician.  Recommendations may be updated based on patient status, additional functional criteria and insurance authorization.  Follow Up Recommendations  Home health PT     Assistance Recommended at Discharge Intermittent Supervision/Assistance  Patient can return home with the following Assistance with cooking/housework;Assist for transportation;Help with stairs or ramp for entrance;A little help with walking and/or transfers;A little help with bathing/dressing/bathroom   Equipment Recommendations  None recommended by PT    Recommendations for Other Services       Precautions / Restrictions Precautions Precautions: Fall Restrictions Weight Bearing Restrictions: No     Mobility  Bed Mobility Overal bed mobility: Needs Assistance Bed Mobility: Supine to Sit     Supine to sit: Min assist          Transfers Overall transfer level: Needs assistance Equipment used: Rolling walker (2 wheels) Transfers: Sit to/from Stand, Bed to chair/wheelchair/BSC Sit to Stand: Mod assist   Step pivot transfers: Min guard             Ambulation/Gait Ambulation/Gait assistance: Min guard Gait Distance (Feet): 28 Feet   Gait Pattern/deviations: Step-to pattern, Decreased step length - right, Decreased stride length, Decreased stance time - left, Decreased weight shift to left, Trunk flexed, Wide base of support Gait velocity: decreased     General Gait Details: slow, labored cadence with RW; moderate gait deviations as noted above; limited by fatigue and patient's fear of his legs giving out on him unexpectedly; on room air throughout   Stairs             Wheelchair Mobility    Modified Rankin (Stroke Patients Only)          Cognition Alert and oriented                                               Exercises General Exercises - Lower Extremity Ankle Circles/Pumps: Both, 10 reps, Seated Long Arc Quad: Both, 10 reps Heel Slides: Supine, 5 reps, Both Hip ABduction/ADduction: Both, 10 reps, Standing Straight Leg Raises: Both, 5 reps Mini-Sqauts: 10 reps    General Comments        Pertinent Vitals/Pain Pain Assessment Pain Assessment: Faces Faces Pain Scale: Hurts even more Pain Location: Lt thigh with SLR Pain Descriptors / Indicators: Grimacing, Spasm Pain Intervention(s): Limited activity within patient's tolerance       Prior Function   Household ambulation with Rollator         PT Goals (current goals can now be  found in the care plan section)      Frequency    Min 3X/week      PT Plan  HH          End of Session Equipment Utilized During Treatment: Gait belt Activity Tolerance: Patient limited by fatigue Patient left: with call bell/phone within reach;with family/visitor present;in chair Nurse Communication: Mobility status PT Visit Diagnosis: Unsteadiness on feet (R26.81);Other abnormalities of gait and mobility (R26.89);Repeated falls (R29.6);Muscle weakness (generalized) (M62.81)     Time: 4081-4481 PT Time Calculation (min) (ACUTE  ONLY): 32 min  Charges:  $Gait Training: 8-22 mins $Therapeutic Exercise: 8-22 mins                    Rayetta Humphrey, PT CLT [phone]  12/01/2021, 11:33 AM

## 2021-12-01 NOTE — Progress Notes (Signed)
Pt has discharge orders, discharge teaching given to pt and wife and daughter who is bedside, no further questions at this time. Pt transferred out via w/c by staff to vehicle accompanied by daughter.

## 2021-12-01 NOTE — Discharge Summary (Signed)
Physician Discharge Summary   Patient: Bradley Sheppard MRN: [id number] DOB: 09/25/1940  Admit date:     11/23/2021  Discharge date: 12/01/21  Discharge Physician: Shanon Brow Lenka Zhao   PCP: [name], MD   Recommendations at discharge:   Please follow up with primary care provider within 1-2 weeks  Please repeat BMP, LFTs and CBC in one week    Hospital Course: Bradley Sheppard is a 81 year old male with extensive history of DM 2, HTN, HLD,MALT lymphoma, CAD presented to ED with chief complaint of shortness of breath -Patient reports shortness of breath has been going on for the past week, progressively getting worse especially with exertion. He denies any chest pain or cough.  Denies of having any recent illnesses States that he has CAD but has not been diagnosed with CHF.  And he is not O2 dependent.    ED course: Blood pressure 133/71, pulse 70, temperature 98.2 F (36.8 C), temperature source Oral, resp. rate 20, height 6' (1.829 m), weight 99.8 kg, SpO2 97 % on 2 L  CBC WBC 9.5, hemoglobin 9.8, CMP within normal limits with exception of BUN 48, creatinine 1.65, calcium 8.6, troponin 82 glucose 220 BNP 308, Total iron 44 D-dimer 2.4,  Chest x-ray: Moderate congestive heart failure CTA: 1. No CT evidence for acute pulmonary embolus. 2. Moderate right and small to moderate left pleural effusions with collapse/consolidation in both lower lobes. 3. Patchy bilateral ground-glass opacities, compatible with edema and/or infection. 4. Aortic Atherosclerosis  Patient underwent Right thoracocentesis  (1.6L) and was started on IV lasix.  His breathing improved and he was weaned to RA.  He subsequently had left thora on 7/27 which removed 900 cc.  His discharge was delayed due development of abd pain.  CT of abd/pelvis was negative for acute findings.  Unfortunately, his discharge was delayed again due to development of fever up to 101.2 on the evening of 7/28.  He was started on Unasyn  after discussion with pulmonary for high WBCs in his pleural fluid.  Pleural fluid cultures remained negative.  Blood cultures were obtained.  Assessment and Plan: Acute HFpEF - BNP was elevated to 308 on admission and CXR showed moderate pleural effusions with CTA being negative for a PE. -7/24 Echo--EF 65-70%, G2DD -7/24 Right thora--1.6L removed -responded well to IV lasix -7/26 now on po lasix -appreciate cardiology -I/Os incomplete   Pleural Effusion -exudative by Light's criteria -7/24Right Thora--WBC 6234, LDH 171 (63 N, 8 Mono) -cytology negative for malignant cells -fluid cultures neg -appreciate pulm consult -PET scan 08/2021 did not show any evidence of pleural hypermetabolism, so malignancy seems unlikely -follow-up with serial imaging to see if effusion recurs -7/23 CTA chest neg for PE 7/27 CXR--personally reviewed--left pleural effusion>>request thora 7/27--Left thora--900 cc removed (WBC 11.5K) 7/28--discussed with Dr. 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 -Dr. 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 -Family requested Dr. Raliegh Ip to be consulted -appreciate input   HTN -continue coreg>>dose decreased -amlodipine discontinued due to soft BP -will not restart ARB or amlodipine or hydralazine   HLD - Followed by his PCP as an outpatient. Remains on Crestor '20mg'$  daily>>held for elevated LFTs -hold crestor until repeat LFTs as outpt.   CAD - prior stents in 2003 and most recent ischemic evaluation was in 2014 as outlined above. Repeat echo this admission shows a preserved EF with no regional WMA. He was previously having chest pain with coughing but no specific angina. No plans for further ischemic testing this admission. - Remains on ASA '81mg'$  daily, Coreg 18.'75mg'$  BID and Crestor '20mg'$  daily.    Aortic Stenosis - Moderate by repeat echo this admission. Continue to follow as an outpatient.    Abdominal pain -CT abd/pelvis--neg for acute findings -suspect combination of constipation and [place] injections -overall improved -tolerating diet and having BMs   Diabetes mellitus type 2 with hyperglycemia 11/23/21 A1C--7.6 Novolog sliding scale       Consultants: pulm Procedures performed: thoracocentesis  Disposition: Home Diet recommendation:  Cardiac diet DISCHARGE MEDICATION: Allergies as of 12/01/2021       Reactions   Prednisone Other (See Comments)   "makes him feel faint"        Medication List     STOP taking these medications    amLODipine 10 MG tablet Commonly known as: NORVASC   chlorthalidone 25 MG tablet Commonly known as: HYGROTON   hydrALAZINE 25 MG tablet Commonly known as: APRESOLINE   OZEMPIC (0.25 OR 0.5 MG/DOSE) [place]   ramipril 10 MG capsule Commonly known as: ALTACE   rosuvastatin 20 MG tablet Commonly known as: CRESTOR   valsartan 320 MG tablet Commonly known as: DIOVAN       TAKE these medications    acetaminophen 500 MG  tablet Commonly known as: TYLENOL Take 500 mg by mouth every 6 (six) hours as needed.   amoxicillin-clavulanate 875-125 MG tablet Commonly known as: AUGMENTIN Take 1 tablet by mouth every 12 (twelve) hours.   ascorbic acid 500 MG tablet Commonly known as: VITAMIN C Take 500 mg by mouth daily.   aspirin 81 MG tablet Take 81 mg by mouth daily.   carvedilol 12.5 MG tablet Commonly known as: COREG Take 1 tablet (12.5 mg total) by mouth 2 (two) times daily with a meal. What changed:  how much to take when to take this   cyanocobalamin 500 MCG tablet Commonly known as: VITAMIN B12 Take 500 mcg by mouth daily.   furosemide 40 MG tablet Commonly known as: LASIX Take 1 tablet (40 mg total) by mouth daily. Start taking on: December 02, 2021   glipiZIDE 10 MG 24 hr tablet Commonly known as: GLUCOTROL XL Take 10 mg by mouth 2 (two) times daily.   IRON PO Take 1 tablet by mouth 2 (two) times daily.   LORazepam 1 MG tablet Commonly known as: ATIVAN Take 1 mg by mouth at bedtime.   metFORMIN 500 MG tablet Commonly known as: GLUCOPHAGE Take 500-1,000 mg by mouth See admin instructions. Take 1000 mg in the morning and 500 mg at night   nitroGLYCERIN 0.4 MG SL tablet Commonly known as: NITROSTAT Place 1  tablet (0.4 mg total) under the tongue every 5 (five) minutes as needed.   pioglitazone 45 MG tablet Commonly known as: ACTOS Take 45 mg by mouth daily.   polyethylene glycol-electrolytes 420 g solution Commonly known as: TriLyte Take 4,000 mLs by mouth as directed.   tiZANidine 2 MG tablet Commonly known as: ZANAFLEX Take 2 mg by mouth 2 (two) times daily.               Durable Medical Equipment  (From admission, onward)           Start     Ordered   11/29/21 0905  For home use only DME Hospital bed  Once       Question Answer Comment  Length of Need 6 Months   The above medical condition requires: Patient requires the ability to reposition immediately    Bed type Semi-electric      11/29/21 0904   11/26/21 1250  For home use only DME Bedside commode  Once       Question:  Patient needs a bedside commode to treat with the following condition  Answer:  Physical deconditioning   11/26/21 1249   11/26/21 1250  For home use only DME Walker rolling  Once       Question Answer Comment  Walker: With [place]   Patient needs a walker to treat with the following condition Gait instability      11/26/21 1250            Follow-up Information     Imogene Burn, PA-C Follow up on 12/15/2021.   Specialty: Cardiology Why: Cardiology Follow-up on 12/15/2021 at 1:00 PM with Ermalinda Barrios, PA-C (works with Dr. Domenic Polite). Will be in the [place] OFFICE due to availability. Contact information: [place] [address] [phone]         [facility] Follow up.   Why: [facility] will provide home health services. They will contact you within 48 hours of discharge.        [facility] Follow up.   Why: [facility] will provide a provide a bed side commode and rolling walker.               Discharge Exam: Filed Weights   11/29/21 0500 11/30/21 0500 12/01/21 0510  Weight: 101.1 kg 96.8 kg 93.3 kg   HEENT:  [place]/AT, No thrush, no icterus CV:  RRR, no rub, no S3, no S4 Lung:  bibasilar crackles. No wheeze Abd:  soft/+BS, NT Ext:  trace LE edema, no lymphangitis, no synovitis, no rash   Condition at discharge: stable  The results of significant diagnostics from this hospitalization (including imaging, microbiology, ancillary and laboratory) are listed below for reference.   Imaging Studies: US Abdomen Limited RUQ (LIVER/GB)  Result Date: 12/01/2021 CLINICAL DATA:  Transaminitis EXAM: ULTRASOUND ABDOMEN LIMITED RIGHT UPPER QUADRANT COMPARISON:  CT abdomen pelvis 11/28/2021 FINDINGS: Gallbladder: Gallstones: None Sludge: None Gallbladder Wall: Within normal limits Pericholecystic fluid: None  Sonographic Murphy's Sign: Negative per technologist Common bile duct: Diameter: 2 mm Liver: Parenchymal echogenicity: Within normal limits Contours: Normal Lesions: None Portal vein: Patent.  Hepatopetal flow Other: Trace right pleural effusion partially visualized. IMPRESSION: No significant sonographic abnormality of the liver or gallbladder. Electronically Signed   By: Miachel Roux M.D.   On: 12/01/2021 09:41   CT CHEST WO CONTRAST  Result Date: 11/29/2021 CLINICAL DATA:  History of pleural effusion in a patient with multiple lymph [place]. *  Tracking Code: BO * EXAM: CT CHEST WITHOUT CONTRAST TECHNIQUE: Multidetector CT imaging of the chest was performed following the standard protocol without IV contrast. RADIATION DOSE REDUCTION: This exam was performed according to the departmental dose-optimization program which includes automated exposure control, adjustment of the mA and/or kV according to patient size and/or use of iterative reconstruction technique. COMPARISON:  November 23, 2021. FINDINGS: Cardiovascular: Calcified aortic atherosclerosis. Calcified coronary artery disease, three-vessel disease. Stable heart size with signs of small pericardial effusion. Heart size moderately enlarged. Mediastinum/Nodes: No signs of adenopathy in the mediastinum or in the LEFT or RIGHT hilum. No internal mammary adenopathy. No axillary lymphadenopathy. No thoracic inlet lymphadenopathy. Lungs/Pleura: Subpleural reticulation in the chest bilaterally. Calcified parenchymal changes at the lung bases with similar appearance. Decreased, RIGHT-sided pleural effusion with persistent small sub pulmonic component over the RIGHT hemidiaphragm and improved aeration at the RIGHT lung base when compared to previous imaging. Persistent near complete collapse of LEFT lower lobe and moderate size sub pulmonic and dependent LEFT-sided pleural effusion. Airways are patent. Upper Abdomen: Incidental imaging of upper abdominal contents with  unremarkable appearance of imaged portions the liver, pancreas, spleen, adrenal glands and gastrointestinal tract to the extent evaluated. Kidneys with cortical scarring are incompletely evaluated. Musculoskeletal: No sign of acute musculoskeletal finding. Degenerative changes of the glenohumeral joints and of the spine. Spinal curvature with levoconvex curvature at the thoracolumbar transition with similar appearance to prior imaging. IMPRESSION: 1. Decreased, RIGHT-sided pleural effusion with persistent small sub pulmonic component over the RIGHT hemidiaphragm and improved aeration at the RIGHT lung base when compared to previous imaging. 2. Persistent near complete collapse of LEFT lower lobe and moderate size sub pulmonic and dependent LEFT-sided pleural effusion. 3. Parenchymal calcifications of lung parenchyma in collapsed lung with stippled calcifications. No visible discrete lesion. 4. Three-vessel coronary artery disease. 5. Small pericardial effusion. 6. Aortic atherosclerosis. Aortic Atherosclerosis (ICD10-I70.0). Electronically Signed   By: Zetta Bills M.D.   On: 11/29/2021 13:55   CT ABDOMEN PELVIS WO CONTRAST  Result Date: 11/28/2021 CLINICAL DATA:  LEFT lower quadrant abdominal pain. EXAM: CT ABDOMEN AND PELVIS WITHOUT CONTRAST TECHNIQUE: Multidetector CT imaging of the abdomen and pelvis was performed following the standard protocol without IV contrast. RADIATION DOSE REDUCTION: This exam was performed according to the departmental dose-optimization program which includes automated exposure control, adjustment of the mA and/or kV according to patient size and/or use of iterative reconstruction technique. COMPARISON:  None Available. FINDINGS: Lower chest: Bilateral pleural effusions and dense bibasilar atelectasis. Decrease in RIGHT pleural effusion compared to CT 5 days prior. There is high-density within the periphery of the atelectatic lungs . Hepatobiliary: No focal hepatic lesion. No  biliary duct dilatation. Common bile duct is normal. Gallbladder normal Pancreas: Pancreas is normal. No ductal dilatation. No pancreatic inflammation. Spleen: Normal spleen Adrenals/urinary tract: Adrenal glands and kidneys are normal. The ureters and bladder normal. Stomach/Bowel: Stomach, small bowel, appendix, and cecum are normal. The colon and rectosigmoid colon are normal. Moderate volume stool in the colon. Vascular/Lymphatic: Abdominal aorta is normal caliber with atherosclerotic calcification. There is no retroperitoneal or periportal lymphadenopathy. No pelvic lymphadenopathy. Reproductive: Prostate unremarkable Other: No free fluid. Musculoskeletal: No aggressive osseous lesion. Scoliosis with disc osteophytic disease. IMPRESSION: 1. No explanation for LEFT lower quadrant pain. No diverticulitis or hernia. No ureterolithiasis or obstructive uropathy. 2. Bilateral pleural effusions and dense bibasilar atelectasis. Decrease in RIGHT pleural effusion. 3. High-density within the atelectatic lungs unchanged. Electronically Signed   By: Suzy Bouchard  M.D.   On: 11/28/2021 14:36   DG Chest 1 View  Result Date: 11/27/2021 CLINICAL DATA:  LEFT pleural effusion post thoracentesis EXAM: CHEST  1 VIEW COMPARISON:  Repeat exam 1603 hours compared to 0947 hours FINDINGS: Enlargement of cardiac silhouette with slight vascular congestion. Atherosclerotic calcification aorta. Minimal bibasilar effusions and atelectasis slightly greater on LEFT. No pneumothorax following thoracentesis. Bones demineralized with BILATERAL glenohumeral degenerative changes and skin dextroconvex scoliosis thoracic spine. IMPRESSION: No pneumothorax following LEFT thoracentesis. Otherwise no change. Electronically Signed   By: Lavonia Dana M.D.   On: 11/27/2021 16:44   US THORACENTESIS ASP PLEURAL SPACE W/IMG GUIDE  Result Date: 11/27/2021 INDICATION: LEFT pleural effusion, LEFT chest pain EXAM: ULTRASOUND GUIDED DIAGNOSTIC AND  THERAPEUTIC LEFT THORACENTESIS MEDICATIONS: None. COMPLICATIONS: None immediate. PROCEDURE: An ultrasound guided thoracentesis was thoroughly discussed with the patient and questions answered. The benefits, risks, alternatives and complications were also discussed. The patient understands and wishes to proceed with the procedure. Written consent was obtained. Ultrasound was performed to localize and mark an adequate pocket of fluid in the LEFT chest. The area was then prepped and draped in the normal sterile fashion. 1% Lidocaine was used for local anesthesia. Under ultrasound guidance a 6 Fr Safe-T-Centesis catheter was introduced. Thoracentesis was performed. The catheter was removed and a dressing applied. FINDINGS: A total of approximately 900 mL of yellow fluid was removed. Samples were sent to the laboratory as requested by the clinical team. IMPRESSION: Successful ultrasound guided LEFT thoracentesis yielding 900 mL of pleural fluid. Electronically Signed   By: Lavonia Dana M.D.   On: 11/27/2021 16:40   DG CHEST PORT 1 VIEW  Result Date: 11/27/2021 CLINICAL DATA:  Pleural effusion. EXAM: PORTABLE CHEST 1 VIEW COMPARISON:  One-view chest x-ray 11/24/2021 FINDINGS: Heart is enlarged. Lung volumes are low. Bilateral pleural effusions are noted, left greater than right. The left pleural effusion has increased. Asymmetric left lower lobe airspace disease is present. Degenerative changes are noted at the shoulders. IMPRESSION: 1. Cardiomegaly without failure. 2. Bilateral pleural effusions, left greater than right. 3. Asymmetric left lower lobe airspace disease likely reflects atelectasis. Infection is not excluded. Electronically Signed   By: [place] Morelle M.D.   On: 11/27/2021 09:59   DG Chest 1 View  Result Date: 11/24/2021 CLINICAL DATA:  RIGHT pleural effusion post thoracentesis EXAM: CHEST  1 VIEW COMPARISON:  11/24/2021 FINDINGS: Enlargement of cardiac silhouette with pulmonary vascular  congestion. Markedly decreased RIGHT pleural effusion and basilar atelectasis. Residual small LEFT pleural effusion and basilar atelectasis. Interstitial infiltrate consistent with pulmonary edema/CHF. Atherosclerotic calcification aorta. No pneumothorax following thoracentesis. Osseous demineralization. IMPRESSION: Mild CHF with decreased RIGHT pleural effusion and basilar atelectasis post thoracentesis. No pneumothorax following RIGHT thoracentesis. Electronically Signed   By: Lavonia Dana M.D.   On: 11/24/2021 13:20   US THORACENTESIS ASP PLEURAL SPACE W/IMG GUIDE  Result Date: 11/24/2021 INDICATION: RIGHT pleural effusion EXAM: ULTRASOUND GUIDED DIAGNOSTIC AND THERAPEUTIC RIGHT spine THORACENTESIS MEDICATIONS: None. COMPLICATIONS: None immediate. PROCEDURE: An ultrasound guided thoracentesis was thoroughly discussed with the patient and questions answered. The benefits, risks, alternatives and complications were also discussed. The patient understands and wishes to proceed with the procedure. Written consent was obtained. Ultrasound was performed to localize and mark an adequate pocket of fluid in the right chest. The area was then prepped and draped in the normal sterile fashion. 1% Lidocaine was used for local anesthesia. Under ultrasound guidance a 6 Fr Safe-T-Centesis catheter was introduced. Thoracentesis was performed. The catheter  was removed and a dressing applied. FINDINGS: A total of approximately 1.6 L of slightly cloudy yellow RIGHT pleural fluid was removed. Samples were sent to the laboratory as requested by the clinical team. IMPRESSION: Successful ultrasound guided RIGHT thoracentesis yielding 1.6 L of pleural fluid. Electronically Signed   By: Lavonia Dana M.D.   On: 11/24/2021 13:19   ECHOCARDIOGRAM COMPLETE  Result Date: 11/24/2021    ECHOCARDIOGRAM REPORT   Patient Name:   KWADWO TARAS Date of Exam: 11/24/2021 Medical Rec #:  [id number]        Height:       72.0 in Accession #:     [id number]       Weight:       226.9 lb Date of Birth:  [birth year]        BSA:          2.248 m Patient Age:    81 years         BP:           141/59 mmHg Patient Gender: M                HR:           62 bpm. Exam Location:  [facility] Procedure: 2D Echo, Cardiac Doppler and Color Doppler Indications:    I33.9 SBE  History:        Patient has prior history of Echocardiogram examinations, most                 recent 07/09/2020. CHF, CAD and Previous Myocardial Infarction,                 Aortic Valve Disease, Signs/Symptoms:Murmur; Risk                 Factors:Hypertension, Dyslipidemia, Former Smoker and Diabetes.                 Hx of CKD.  Sonographer:    Alvino Chapel RCS Referring Phys: [place]  1. Left ventricular ejection fraction, by estimation, is 65 to 70%. The left ventricle has normal function. The left ventricle has no regional wall motion abnormalities. There is moderate concentric left ventricular hypertrophy. Left ventricular diastolic parameters are consistent with Grade II diastolic dysfunction (pseudonormalization). Elevated left ventricular end-diastolic pressure.  2. Right ventricular systolic function is normal. The right ventricular size is normal. Tricuspid regurgitation signal is inadequate for assessing PA pressure.  3. Left atrial size was severely dilated.  4. Right atrial size was moderately dilated.  5. A small pericardial effusion is present. The pericardial effusion is posterior to the left ventricle and anterior to the right ventricle. There is no evidence of cardiac tamponade.  6. The mitral valve is degenerative. Trivial mitral valve regurgitation.  7. The aortic valve is tricuspid with restricted motion of the left coronary cusp. There is moderate calcification of the aortic valve. Moderate aortic valve stenosis. Aortic valve mean gradient measures 18.0 mmHg. Dimentionless index 0.44.  8. The inferior vena cava is dilated in size with >50% respiratory  variability, suggesting right atrial pressure of 8 mmHg. Comparison(s): Prior images reviewed side by side. LVEF vigorous with moderate diastolic dysfunction and increased LV filling pressure. Moderate aortic stenosis, mean gradient has increased from 13 mmHg to 18 mmHg. FINDINGS  Left Ventricle: Left ventricular ejection fraction, by estimation, is 65 to 70%. The left ventricle has normal function. The left ventricle has no regional wall motion abnormalities. The  left ventricular internal cavity size was normal in size. There is  moderate concentric left ventricular hypertrophy. Left ventricular diastolic parameters are consistent with Grade II diastolic dysfunction (pseudonormalization). Elevated left ventricular end-diastolic pressure. Right Ventricle: The right ventricular size is normal. No increase in right ventricular wall thickness. Right ventricular systolic function is normal. Tricuspid regurgitation signal is inadequate for assessing PA pressure. Left Atrium: Left atrial size was severely dilated. Right Atrium: Right atrial size was moderately dilated. Pericardium: A small pericardial effusion is present. The pericardial effusion is posterior to the left ventricle and anterior to the right ventricle. There is no evidence of cardiac tamponade. Mitral Valve: The mitral valve is degenerative in appearance. There is mild thickening of the mitral valve leaflet(s). There is mild calcification of the mitral valve leaflet(s). Trivial mitral valve regurgitation. Tricuspid Valve: The tricuspid valve is grossly normal. Tricuspid valve regurgitation is trivial. Aortic Valve: The aortic valve is tricuspid. There is moderate calcification of the aortic valve. There is mild to moderate aortic valve annular calcification. Aortic valve regurgitation is not visualized. Moderate aortic stenosis is present. Aortic valve mean gradient measures 18.0 mmHg. Aortic valve peak gradient measures 33.4 mmHg. Aortic valve area, by VTI  measures 1.37 cm. Pulmonic Valve: The pulmonic valve was grossly normal. Pulmonic valve regurgitation is trivial. Aorta: The aortic root is normal in size and structure. Venous: The inferior vena cava is dilated in size with greater than 50% respiratory variability, suggesting right atrial pressure of 8 mmHg. IAS/Shunts: No atrial level shunt detected by color flow Doppler.  LEFT VENTRICLE PLAX 2D LVIDd:         4.40 cm   Diastology LVIDs:         2.70 cm   LV e' medial:    7.29 cm/s LV PW:         1.50 cm   LV E/e' medial:  18.9 LV IVS:        1.70 cm   LV e' lateral:   6.74 cm/s LVOT diam:     2.00 cm   LV E/e' lateral: 20.5 LV SV:         92 LV SV Index:   41 LVOT Area:     3.14 cm  RIGHT VENTRICLE RV S prime:     15.80 cm/s TAPSE (M-mode): 2.8 cm LEFT ATRIUM              Index        RIGHT ATRIUM           Index LA diam:        4.30 cm  1.91 cm/m   RA Area:     26.20 cm LA Vol (A2C):   99.3 ml  44.18 ml/m  RA Volume:   90.00 ml  40.04 ml/m LA Vol (A4C):   138.0 ml 61.39 ml/m LA Biplane Vol: 119.0 ml 52.94 ml/m  AORTIC VALVE AV Area (Vmax):    1.33 cm AV Area (Vmean):   1.35 cm AV Area (VTI):     1.37 cm AV Vmax:           289.00 cm/s AV Vmean:          199.000 cm/s AV VTI:            0.669 m AV Peak Grad:      33.4 mmHg AV Mean Grad:      18.0 mmHg LVOT Vmax:         122.33 cm/s LVOT Vmean:  85.400 cm/s LVOT VTI:          0.292 m LVOT/AV VTI ratio: 0.44  AORTA Ao Root diam: 3.70 cm MITRAL VALVE MV Area (PHT): 3.03 cm     SHUNTS MV Decel Time: 250 msec     Systemic VTI:  0.29 m MV E velocity: 138.00 cm/s  Systemic Diam: 2.00 cm MV A velocity: 77.10 cm/s MV E/A ratio:  1.79 Rozann Lesches MD Electronically signed by Rozann Lesches MD Signature Date/Time: 11/24/2021/11:51:56 AM    Final    DG CHEST PORT 1 VIEW  Result Date: 11/24/2021 CLINICAL DATA:  Worsening shortness of breath with exertion. EXAM: PORTABLE CHEST 1 VIEW COMPARISON:  Chest radiograph 1 day prior FINDINGS: The  cardiomediastinal silhouette is stable. Moderate right larger than left pleural effusions are again seen with patchy opacities in the lower lobes, right worse than left. There is vascular congestion and mild pulmonary interstitial edema. Overall, aeration appears slightly worsened in the right lower lobe compared to the study from 1 day prior. There is no pneumothorax The bones are stable. IMPRESSION: Moderate right larger than left pleural effusions with adjacent opacities in the lower lobes, worsened in the right base compared to the study from 1 day prior. Electronically Signed   By: Valetta Mole M.D.   On: 11/24/2021 08:16   CT Angio Chest PE W and/or Wo Contrast  Result Date: 11/23/2021 CLINICAL DATA:  History of MALT lymphoma. Exertional dyspnea. Clinical concern for pulmonary embolus. EXAM: CT ANGIOGRAPHY CHEST WITH CONTRAST TECHNIQUE: Multidetector CT imaging of the chest was performed using the standard protocol during bolus administration of intravenous contrast. Multiplanar CT image reconstructions and MIPs were obtained to evaluate the vascular anatomy. RADIATION DOSE REDUCTION: This exam was performed according to the departmental dose-optimization program which includes automated exposure control, adjustment of the mA and/or kV according to patient size and/or use of iterative reconstruction technique. CONTRAST:  43m OMNIPAQUE IOHEXOL 350 MG/ML SOLN COMPARISON:  PET-CT 08/28/2021 FINDINGS: Cardiovascular: Heart is enlarged. Coronary artery calcification is evident. Mild atherosclerotic calcification is noted in the wall of the thoracic aorta. There is no filling defect within the opacified pulmonary arteries to suggest the presence of an acute pulmonary embolus. Mediastinum/Nodes: No mediastinal lymphadenopathy. There is no hilar lymphadenopathy. The esophagus has normal imaging features. There is no axillary lymphadenopathy. Lungs/Pleura: Fine architectural lung detail obscured by substantial  breathing motion during image acquisition. Probable component of underlying emphysema. There is collapse/consolidation in both lower lobes with superimposed patchy bilateral ground-glass opacities. Moderate right and small to moderate left pleural effusions evident. Upper Abdomen: Unremarkable. Musculoskeletal: No worrisome lytic or sclerotic osseous abnormality. Review of the MIP images confirms the above findings. IMPRESSION: 1. No CT evidence for acute pulmonary embolus. 2. Moderate right and small to moderate left pleural effusions with collapse/consolidation in both lower lobes. 3. Patchy bilateral ground-glass opacities, compatible with edema and/or infection. 4. Aortic Atherosclerosis (ICD10-I70.0). Electronically Signed   By: EMisty StanleyM.D.   On: 11/23/2021 08:59   DG Chest 2 View  Result Date: 11/23/2021 CLINICAL DATA:  Shortness of breath. EXAM: CHEST - 2 VIEW COMPARISON:  11/23/2021 FINDINGS: Stable cardiac enlargement. Aortic atherosclerotic calcifications. Small bilateral pleural effusions noted, right greater than left. Mild diffuse interstitial edema. No airspace consolidation. IMPRESSION: 1. Moderate congestive heart failure. Electronically Signed   By: TKerby MoorsM.D.   On: 11/23/2021 07:08   MR PELVIS WO CM RECTAL CA STAGING  Result Date: 11/10/2021 CLINICAL DATA:  Rectal carcinoma.  Local staging. EXAM: MRI PELVIS WITHOUT CONTRAST TECHNIQUE: Multiplanar multisequence MR imaging of the pelvis was performed. No intravenous contrast was administered. Rectal ultrasound gel could not be administered as it was not tolerated by the patient. COMPARISON:  None Available. FINDINGS: TUMOR LOCATION Evaluation limited by lack of rectal distention by ultrasound gel. Tumor distance from Anal Verge/Skin Surface:  3.0 cm Tumor distance to Internal Anal Sphincter: 0 cm; tumor does contact the internal sphincter TUMOR DESCRIPTION Circumferential Extent: 100% Tumor Length: 3.0 cm T - CATEGORY  Extension through Muscularis Propria: No= T1/T2 Shortest Distance of any tumor/node from Mesorectal Fascia: N/a Extramural Vascular Invasion/Tumor Thrombus: No Invasion of Anterior Peritoneal Reflection: No Involvement of Adjacent Organs or Pelvic Sidewall: No Levator Ani Involvement: No N - CATEGORY Mesorectal Lymph Nodes >=69m: None=N0 Extra-mesorectal Lymphadenopathy: 2 mildly enlarged left external iliac lymph nodes are seen which measure 1.3 cm and 1.6 cm in maximum diameter. Other:  Sigmoid diverticulosis, without evidence of diverticulitis. 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 Distance from tumor to the internal anal sphincter is 0 cm. Electronically Signed   By: JMarlaine HindM.D.   On: 11/10/2021 09:22    Microbiology: Results for orders placed or performed during the hospital encounter of 11/23/21  Gram stain     Status: None   Collection Time: 11/24/21 12:40 PM   Specimen: Pleura  Result Value Ref Range Status   Specimen Description PLEURAL  Final   Special Requests PLEURAL  Final   Gram Stain   Final    WBC PRESENT,BOTH PMN AND MONONUCLEAR NO ORGANISMS SEEN CYTOSPIN SMEAR [facility] at [facility] [address], [place] [address]   Report Status 11/24/2021 FINAL  Final  Culture, body fluid w Gram Stain-bottle     Status: None   Collection Time: 11/24/21 12:40 PM   Specimen: Pleura  Result Value Ref Range Status   Specimen Description PLEURAL  Final   Special Requests   Final    BOTTLES DRAWN AEROBIC AND ANAEROBIC Blood Culture results may not be optimal due to an excessive volume of blood received in culture bottles   Culture   Final    NO GROWTH 5 DAYS [facility]formed at [facility] [address], [place] [address]   Report Status 11/29/2021 FINAL  Final  Gram stain     Status: None   Collection  Time: 11/27/21  3:45 PM   Specimen: Pleura  Result Value Ref Range Status   Specimen Description PLEURAL LEFT  Final   Special Requests NONE  Final   Gram Stain   Final    NO ORGANISMS SEEN WBC PRESENT,BOTH PMN AND MONONUCLEAR CYTOSPIN SMEAR [facility] [address] [place] [address]   Report Status 11/27/2021 FINAL  Final  Culture, body fluid w Gram Stain-bottle     Status: None (Preliminary result)   Collection Time: 11/27/21  3:45 PM   Specimen: Pleura  Result Value Ref Range Status   Specimen Description   Final    PLEURAL LEFT [facility] [address] [place] [address]   Special Requests   Final    BOTTLES DRAWN AEROBIC AND ANAEROBIC Blood Culture adequate volume Performed at [facility] [place] [address]   Culture  Final    NO GROWTH 4 DAYS Performed at [facility], [address], [place], [place] [address]    Report Status PENDING  Incomplete  Culture, blood (Routine X 2) w Reflex to ID Panel     Status: None (Preliminary result)   Collection Time: 11/29/21  7:03 AM   Specimen: Right Antecubital; Blood  Result Value Ref Range Status   Specimen Description   Final    RIGHT ANTECUBITAL BOTTLES DRAWN AEROBIC AND ANAEROBIC   Special Requests   Final    Blood Culture results may not be optimal due to an excessive volume of blood received in culture bottles   Culture   Final    NO GROWTH 2 DAYS Performed at [facility], [address], [place], [place] [address]    Report Status PENDING  Incomplete  Culture, blood (Routine X 2) w Reflex to ID Panel     Status: None (Preliminary result)   Collection Time: 11/29/21  7:15 AM   Specimen: BLOOD RIGHT HAND  Result Value Ref Range Status   Specimen Description   Final    BLOOD RIGHT HAND BOTTLES DRAWN AEROBIC AND ANAEROBIC   Special Requests   Final    Blood Culture results may not be optimal due to an excessive  volume of blood received in culture bottles   Culture   Final    NO GROWTH 2 DAYS Performed at [facility], [address], [place], [place] [address]    Report Status PENDING  Incomplete  Urine Culture     Status: None   Collection Time: 11/29/21 11:49 AM   Specimen: Urine, Clean Catch  Result Value Ref Range Status   Specimen Description   Final    URINE, CLEAN CATCH Performed at [facility], [address], [place], [place] [address]    Special Requests   Final    NONE Performed at [facility], [address] [place], [place] [address]    Culture   Final    NO GROWTH Performed at [facility], [place] [address], [place], [place] [address]    Report Status 12/01/2021 FINAL  Final  MRSA Next Gen by PCR, Nasal     Status: None   Collection Time: 11/29/21  1:17 PM   Specimen: Nasal Mucosa; Nasal Swab  Result Value Ref Range Status   MRSA by PCR Next Gen NOT DETECTED NOT DETECTED Final    Comment: (NOTE) The GeneXpert MRSA Assay (FDA approved for NASAL specimens only), is one component of a comprehensive MRSA colonization surveillance program. It is not intended to diagnose MRSA infection nor to guide or monitor treatment for MRSA infections. Test performance is not FDA approved in patients less than 45 years old. Performed at [facility], [place] [address], [place], [place] [address]     Labs: CBC: Recent Labs  Lab 11/26/21 0602 11/27/21 3016 11/28/21 0553 11/29/21 0716 11/30/21 0530  WBC 6.4 6.6 6.2 4.5 4.7  HGB 8.6* 8.3* 8.4* 8.9* 8.8*  HCT 26.7* 25.7* 25.6* 26.9* 27.3*  MCV 96.0 95.9 95.2 95.4 95.1  PLT 210 231 215 212 010   Basic Metabolic Panel: Recent Labs  Lab 11/26/21 1751 11/27/21 0605 11/28/21 0553 11/30/21 0530 12/01/21 0702  NA 134* 137 137 138 142  K 4.3 3.7 3.9 4.1 3.9  CL 103 106 107 106 108  CO2 '24 24 24 25 27  '$ GLUCOSE 319* 173* 142* 109* 153*  BUN 70* 68* 70* 58* 55*  CREATININE 2.17* 1.97* 1.92* 1.77* 1.69*  CALCIUM 8.3*  8.3* 8.2* 8.2* 8.3*  MG  --   --  2.1  --   --    Liver Function Tests: Recent Labs  Lab 11/25/21 0552 11/30/21 0530 12/01/21 0702  AST  --  120* 99*  ALT  --  91* 85*  ALKPHOS  --  113 142*  BILITOT  --  0.7 0.6  PROT  --  6.2* 6.6  ALBUMIN 2.9* 2.3* 2.4*   CBG: Recent Labs  Lab 11/30/21 1111 11/30/21 1609 11/30/21 2100 12/01/21 0734 12/01/21 1116  GLUCAP 204* 191* 202* 160* 231*    Discharge time spent: greater than 30 minutes.  Signed: Orson Eva, MD [facility] 12/01/2021

## 2021-12-02 DIAGNOSIS — E1140 Type 2 diabetes mellitus with diabetic neuropathy, unspecified: Secondary | ICD-10-CM | POA: Diagnosis not present

## 2021-12-02 DIAGNOSIS — I10 Essential (primary) hypertension: Secondary | ICD-10-CM | POA: Diagnosis not present

## 2021-12-02 DIAGNOSIS — I5032 Chronic diastolic (congestive) heart failure: Secondary | ICD-10-CM | POA: Diagnosis not present

## 2021-12-02 DIAGNOSIS — I509 Heart failure, unspecified: Secondary | ICD-10-CM | POA: Diagnosis not present

## 2021-12-02 DIAGNOSIS — I25119 Atherosclerotic heart disease of native coronary artery with unspecified angina pectoris: Secondary | ICD-10-CM | POA: Diagnosis not present

## 2021-12-02 DIAGNOSIS — Z299 Encounter for prophylactic measures, unspecified: Secondary | ICD-10-CM | POA: Diagnosis not present

## 2021-12-02 DIAGNOSIS — R0602 Shortness of breath: Secondary | ICD-10-CM | POA: Diagnosis not present

## 2021-12-02 DIAGNOSIS — L8990 Pressure ulcer of unspecified site, unspecified stage: Secondary | ICD-10-CM | POA: Diagnosis not present

## 2021-12-02 DIAGNOSIS — I5031 Acute diastolic (congestive) heart failure: Secondary | ICD-10-CM | POA: Diagnosis not present

## 2021-12-02 LAB — CULTURE, BODY FLUID W GRAM STAIN -BOTTLE
Culture: NO GROWTH
Special Requests: ADEQUATE

## 2021-12-03 DIAGNOSIS — N179 Acute kidney failure, unspecified: Secondary | ICD-10-CM | POA: Diagnosis not present

## 2021-12-03 DIAGNOSIS — J45909 Unspecified asthma, uncomplicated: Secondary | ICD-10-CM | POA: Diagnosis not present

## 2021-12-03 DIAGNOSIS — R7401 Elevation of levels of liver transaminase levels: Secondary | ICD-10-CM | POA: Diagnosis not present

## 2021-12-03 DIAGNOSIS — N200 Calculus of kidney: Secondary | ICD-10-CM | POA: Diagnosis not present

## 2021-12-03 DIAGNOSIS — I080 Rheumatic disorders of both mitral and aortic valves: Secondary | ICD-10-CM | POA: Diagnosis not present

## 2021-12-03 DIAGNOSIS — E782 Mixed hyperlipidemia: Secondary | ICD-10-CM | POA: Diagnosis not present

## 2021-12-03 DIAGNOSIS — I130 Hypertensive heart and chronic kidney disease with heart failure and stage 1 through stage 4 chronic kidney disease, or unspecified chronic kidney disease: Secondary | ICD-10-CM | POA: Diagnosis not present

## 2021-12-03 DIAGNOSIS — K6289 Other specified diseases of anus and rectum: Secondary | ICD-10-CM | POA: Diagnosis not present

## 2021-12-03 DIAGNOSIS — D509 Iron deficiency anemia, unspecified: Secondary | ICD-10-CM | POA: Diagnosis not present

## 2021-12-03 DIAGNOSIS — Z792 Long term (current) use of antibiotics: Secondary | ICD-10-CM | POA: Diagnosis not present

## 2021-12-03 DIAGNOSIS — M1990 Unspecified osteoarthritis, unspecified site: Secondary | ICD-10-CM | POA: Diagnosis not present

## 2021-12-03 DIAGNOSIS — N1832 Chronic kidney disease, stage 3b: Secondary | ICD-10-CM | POA: Diagnosis not present

## 2021-12-03 DIAGNOSIS — F419 Anxiety disorder, unspecified: Secondary | ICD-10-CM | POA: Diagnosis not present

## 2021-12-03 DIAGNOSIS — E1122 Type 2 diabetes mellitus with diabetic chronic kidney disease: Secondary | ICD-10-CM | POA: Diagnosis not present

## 2021-12-03 DIAGNOSIS — R32 Unspecified urinary incontinence: Secondary | ICD-10-CM | POA: Diagnosis not present

## 2021-12-03 DIAGNOSIS — I2510 Atherosclerotic heart disease of native coronary artery without angina pectoris: Secondary | ICD-10-CM | POA: Diagnosis not present

## 2021-12-03 DIAGNOSIS — Z7982 Long term (current) use of aspirin: Secondary | ICD-10-CM | POA: Diagnosis not present

## 2021-12-03 DIAGNOSIS — I252 Old myocardial infarction: Secondary | ICD-10-CM | POA: Diagnosis not present

## 2021-12-03 DIAGNOSIS — Z7984 Long term (current) use of oral hypoglycemic drugs: Secondary | ICD-10-CM | POA: Diagnosis not present

## 2021-12-03 DIAGNOSIS — Z96652 Presence of left artificial knee joint: Secondary | ICD-10-CM | POA: Diagnosis not present

## 2021-12-03 DIAGNOSIS — L89151 Pressure ulcer of sacral region, stage 1: Secondary | ICD-10-CM | POA: Diagnosis not present

## 2021-12-03 DIAGNOSIS — I5031 Acute diastolic (congestive) heart failure: Secondary | ICD-10-CM | POA: Diagnosis not present

## 2021-12-03 DIAGNOSIS — G8929 Other chronic pain: Secondary | ICD-10-CM | POA: Diagnosis not present

## 2021-12-03 DIAGNOSIS — C884 Extranodal marginal zone B-cell lymphoma of mucosa-associated lymphoid tissue [MALT-lymphoma]: Secondary | ICD-10-CM | POA: Diagnosis not present

## 2021-12-04 ENCOUNTER — Telehealth: Payer: Self-pay | Admitting: Cardiology

## 2021-12-04 NOTE — Telephone Encounter (Signed)
   Patient Name: Bradley Sheppard  DOB: Jan 25, 1941 MRN: [id number]  Primary Cardiologist: Rozann Lesches, MD  Chart reviewed as part of pre-operative protocol coverage.   Patient has upcoming appointment scheduled with Diona Browner, NP when preoperative clearance will be discussed.  Patient will be removed from preoperative clearance pool at this time.  Mable Fill, Marissa Nestle, NP 12/04/2021, 2:30 PM

## 2021-12-04 NOTE — Telephone Encounter (Signed)
   Pre-operative Risk Assessment    Patient Name: Bradley Sheppard  DOB: Dec 28, 1940 MRN: [id number]      Request for Surgical Clearance    Procedure:   Partial Proctectomy   Date of Surgery:  Clearance 12/31/21                                 Surgeon:  Dr. Michael Boston Surgeon's Group or Practice Name:  [facility]  Phone number:  [phone] Fax number:  [phone]   Type of Clearance Requested:   - Medical    Type of Anesthesia:  Not Indicated   Additional requests/questions:    Sandrea Hammond   12/04/2021, 2:12 PM

## 2021-12-05 LAB — CULTURE, BLOOD (ROUTINE X 2)
Culture: NO GROWTH
Culture: NO GROWTH

## 2021-12-08 NOTE — Progress Notes (Unsigned)
Office Visit    Patient Name: Bradley Sheppard Date of Encounter: 12/09/2021  Primary Care Provider:  [name], MD Primary Cardiologist:  Rozann Lesches, MD  Chief Complaint    81 year old male with a history of CAD, chronic diastolic heart failure, mild aortic stenosis, bilateral carotid artery stenosis, hypertension, hyperlipidemia, CKD stage IIIb, type 2 diabetes, GERD, arthritis, iron deficiency anemia, non-Hodgkin's lymphoma, and rectal tumor who presents for post hospital follow-up related to heart failure and for preoperative cardiac evaluation.  Past Medical History    Past Medical History:  Diagnosis Date   Anxiety    Arthritis    Asthma    Chronic pain    CKD (chronic kidney disease) stage 3, GFR 30-59 ml/min (HCC)    Coronary atherosclerosis of native coronary artery    Multvessel, DES to LAD and RCA 8/03; EF 65% by echo 11/2014   DM2 (diabetes mellitus, type 2) ([place])    Essential hypertension    Gastritis    Related to NSAIDs   GERD (gastroesophageal reflux disease)    Iron deficiency anemia    Iron deficiency anemia 07/28/2021   Mitral valve regurgitation    Mixed hyperlipidemia    Myocardial infarction [facility])    Nephrolithiasis    Past Surgical History:  Procedure Laterality Date   APPENDECTOMY     BIOPSY  09/25/2021   Procedure: BIOPSY;  Surgeon: Daneil Dolin, MD;  Location: [facility];  Service: Endoscopy;;   CARDIAC CATHETERIZATION  01/2002   90% obstruction in proximal LAD, 60 % obstruction in proximal circumflex and 70%  in proximal RCA. LAD & RCA stented  stents x 2   COLONOSCOPY WITH PROPOFOL N/A 09/25/2021   Procedure: COLONOSCOPY WITH PROPOFOL;  Surgeon: Daneil Dolin, MD;  Location: [facility];  Service: Endoscopy;  Laterality: N/A;  11:00am   L knee replacement  2009   Subsequent infectino requiring resectino arthroplasty with incision and drainage 8/10, and reimplantizathion arthroplasty, 9/20. 5 total surgeries.     Allergies  Allergies  Allergen Reactions   Prednisone Other (See Comments)    "makes him feel faint"    History of Present Illness    81 year old male with the above past medical history including CAD, chronic diastolic heart failure, mild aortic stenosis, bilateral carotid artery stenosis, tension, hyperlipidemia, CKD stage IIIb, type 2 diabetes, GERD, arthritis, iron deficiency anemia, non-Hodgkin's lymphoma, and rectal tumor.  He has a history of CAD s/p DES-LAD and RCA in 2003.  Skin Myoview in 2014 was consistent with history of CAD, revealing scar within the inferolateral wall with mild to moderate peri-infarct ischemia, EF 57%.  Echocardiogram in March 2022 showed EF 60 to 65%, normal RV function, no RWMA, mild LVH, indeterminate LV diastolic parameters, mild aortic valve stenosis, mean gradient 13.4 mmHg.  Rotted Dopplers in September 2022 showed 1 to 39% B ICA stenosis.  He was recently diagnosed with non-Hodgkin's lymphoma. 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 He was seen in the office on 10/06/2021 for surgical clearance for rectal tumor.  On that visit he discussed weakness and difficulty ambulating with right hip and knee pain.  He was cleared for surgery as intermediate risk not to preclude surgery. Additionally, he was noted to be on ramipril and valsartan so the ramipril was stopped and hydralazine added.    He presented to the ED on 11/23/2021 with increased dyspnea on exertion and cough with associated chest discomfort.  BNP was 308, D-dimer was 2.4.  CTA of chest was negative for PE but showed moderate right and small to moderate left pleural effusions, she bilateral groundglass opacities, compatible with edema and/or infection.  He was hospitalized from 11/23/2021 to 12/01/2021 in the setting of acute diastolic heart failure, pleural effusion.  Repeat echocardiogram showed EF 65 to 70%, moderate concentric LVH, G2 DD, weighted LVEDP, normal RV systolic function, severely  dilated left atrium, moderately dilated right atrium, small pericardial effusion evidence of cardiac tamponade, trivial mitral valve regurgitation, moderate aortic valve stenosis, mean gradient 18 mmHg.  He underwent right thoracentesis on 11/24/2021 and left thoracentesis on 11/27/2021. He was started on IV Lasix.  Serum creatinine peaked at 2.17.  His breathing quickly improved however, his discharge was delayed due to development of abdominal pain.  CT of the abdomen/pelvis was negative for acute findings.  His discharge was again delayed due to the amount of a fever.  He was started on empiric antibiotics with resolution of fever.  Blood cultures were negative, UA was negative.  Creatinine improved to 1.69 at the time of discharge.  Crestor was discontinued in the setting of transaminitis.  Right upper quadrant ultrasound was negative.  Hep B surface antigen and hep C antibody were negative.  Carvedilol dose was decreased amlodipine and hydralazine were discontinued in the setting of hypotension.  He was discharged home in stable condition on 12/01/2021.  He presents today for follow-up accompanied by his wife and daughter and for preoperative cardiac evaluation for partial proctectomy on 12/31/2021 with Dr. Michael Boston of [facility] surgery.  Since his hospitalization he has been stable from a cardiac standpoint.  His weight has been stable, he denies worsening edema, denies shortness of breath.  His activity has been somewhat decreased since his hospitalization due to overall physical deconditioning, generalized weakness.  He is working with home health PT.  He denies any symptoms concerning for angina.  Overall, he has been stable and denies any new concerns today.   Home Medications    Current Outpatient Medications  Medication Sig Dispense Refill   acetaminophen (TYLENOL) 500 MG tablet Take 500 mg by mouth every 6 (six) hours as needed.     amoxicillin-clavulanate (AUGMENTIN) 875-125 MG tablet  Take 1 tablet by mouth every 12 (twelve) hours. 20 tablet 0   carvedilol (COREG) 12.5 MG tablet Take 1 tablet (12.5 mg total) by mouth 2 (two) times daily with a meal. 60 tablet 1   furosemide (LASIX) 40 MG tablet Take 1 tablet (40 mg total) by mouth daily. 30 tablet 1   glipiZIDE (GLUCOTROL XL) 10 MG 24 hr tablet Take 10 mg by mouth 2 (two) times daily.     IRON PO Take 1 tablet by mouth 2 (two) times daily.     LORazepam (ATIVAN) 1 MG tablet Take 1 mg by mouth at bedtime.     metFORMIN (GLUCOPHAGE) 500 MG tablet Take 500-1,000 mg by mouth See admin instructions. Take 1000 mg in the morning and 500 mg at night     nitroGLYCERIN (NITROSTAT) 0.4 MG SL tablet Place 1 tablet (0.4 mg total) under the tongue every 5 (five) minutes as needed. 25 tablet 3   pioglitazone (ACTOS) 45 MG tablet Take 45 mg by mouth daily.     rosuvastatin (CRESTOR) 5 MG tablet Take 5 mg by mouth once a week.     tiZANidine (ZANAFLEX) 2 MG tablet Take 2 mg by mouth 2 (two) times daily.     vitamin B-12 (CYANOCOBALAMIN) 500 MCG tablet  Take 500 mcg by mouth daily.     vitamin C (ASCORBIC ACID) 500 MG tablet Take 500 mg by mouth daily.     polyethylene glycol-electrolytes (TRILYTE) 420 g solution Take 4,000 mLs by mouth as directed. (Patient not taking: Reported on 11/23/2021) 4000 mL 0   No current facility-administered medications for this visit.     Review of Systems    He denies chest pain, palpitations, dyspnea, pnd, orthopnea, n, v, dizziness, syncope, edema, weight gain, or early satiety. All other systems reviewed and are otherwise negative except as noted above.   Physical Exam    VS:  BP (!) 140/70   Pulse 68   Ht 6' (1.829 m)   Wt 201 lb 12.8 oz (91.5 kg)   SpO2 97%   BMI 27.37 kg/m   GEN: Well nourished, well developed, in no acute distress. HEENT: normal. Neck: Supple, no JVD, carotid bruits, or masses. Cardiac: RRR, 2/6 murmur, no rubs, or gallops. No clubbing, cyanosis, nonpitting bilateral lower  extremity edema.  Radials/DP/PT 2+ and equal bilaterally.  Respiratory:  Respirations regular and unlabored, clear to auscultation bilaterally. GI: Soft, nontender, nondistended, BS + x 4. MS: no deformity or atrophy. Skin: warm and dry, no rash. Neuro:  Strength and sensation are intact. Psych: Normal affect.  Accessory Clinical Findings    ECG personally reviewed by me today -no EKG in office today.  Lab Results  Component Value Date   WBC 4.7 11/30/2021   HGB 8.8 (L) 11/30/2021   HCT 27.3 (L) 11/30/2021   MCV 95.1 11/30/2021   PLT 232 11/30/2021   Lab Results  Component Value Date   CREATININE 1.69 (H) 12/01/2021   BUN 55 (H) 12/01/2021   NA 142 12/01/2021   K 3.9 12/01/2021   CL 108 12/01/2021   CO2 27 12/01/2021   Lab Results  Component Value Date   ALT 85 (H) 12/01/2021   AST 99 (H) 12/01/2021   ALKPHOS 142 (H) 12/01/2021   BILITOT 0.6 12/01/2021   No results found for: "CHOL", "HDL", "LDLCALC", "LDLDIRECT", "TRIG", "CHOLHDL"  Lab Results  Component Value Date   HGBA1C 7.6 (H) 11/23/2021    Assessment & Plan    1.  Acute on chronic diastolic heart failure:  Echo during recent hospitalization in 11/2021 showed EF 65 to 70%, moderate concentric LVH, G2 DD, weighted LVEDP, normal RV systolic function, severely dilated left atrium, moderately dilated right atrium, small pericardial effusion evidence of cardiac tamponade, trivial mitral valve regurgitation, moderate aortic valve stenosis, mean gradient 18 mmHg. Nonpitting bilateral lower extremity edema. Otherwise, euvolemic and well compensated on exam.  Weight has been stable.  Encouraged ongoing monitoring with daily weights.  Continue carvedilol, Lasix.   2.  Bilateral pleural effusions: S/p bilateral thoracentesis. Denies dyspnea. Stable.  3. CAD: He has a history of CAD s/p DES-LAD and RCA in 2003. Lexiscan Myoview in 2014 consistent with history of CAD, revealed scar within the inferolateral wall with mild to  moderate peri-infarct ischemia, EF 57%.  EKG today shows sinus rhythm with first-degree AV block, 70 bpm, LAD, LBBB. He has stable mild dyspnea on exertion, however, this is not similar to his prior anginal equivalent. He has experienced severe physical deconditioning in the setting of orthopedic issues, iron deficiency anemia, and non-Hodgkin's lymphoma. Continue conservative management of CAD with aspirin, carvedilol, and Crestor.   4. Aortic valve stenosis: Most recent echo with moderate AS, mean gradient 18 mmHg.  Nonpitting bilateral lower extremity edema, otherwise euvolemic  and well compensated on exam. Continue current medications as above. Consider repeat echocardiogram in 1 year, sooner if clinically indicated.   5. Carotid artery stenosis: Carotid Dopplers in September 2022 showed 1 to 39% B ICA stenosis. Asymptomatic.  Continue aspirin, Crestor.  No indication for repeat imaging at this time.   6. Hypertension: BP well controlled.  Amlodipine, hydralazine, chlorthalidone, and valsartan recently discontinued in the setting of hypotension.  Continue to monitor home BP and report BP consistently > 140/80, SBP <110.  Otherwise, continue current antihypertensive regimen.   7. Hyperlipidemia/transaminitis: LDL was 64 in August 2022.   Recent transaminitis during hospitalization in July 2023.  Crestor discontinued and subsequently restarted per PCP at 5 mg once weekly. Will check CMET today.    8. CKD stage IIIb: Creatinine was 1.69 on 12/01/2021. Stable.  CMET today as above.  9. Type 2 diabetes: A1c was 8.6 in March 2023.  Monitored and managed per PCP.   10. Iron deficiency anemia: Hemoglobin was 10.5 on 12/04/2021.  He states he is due to have repeat labs by his PCP, however, given it is difficult for him to travel to doctors appointments, will check CBC today per patient request.  11. Preoperative cardiac evaluation:  According to the Revised Cardiac Risk Index (RCRI), his Perioperative Risk  of Major Cardiac Event is (%): 6.6. His Functional Capacity in METs is: 3.3 according to the [employer] Activity Status Index (DASI). Patient is at intermediate risk for surgery given past medical history, comorbidities. However, this is overall a low risk surgery. He is unable to complete 4 METS, however, this has been his baseline since October 2023.  Decreased METS likely multifactorial in the setting of physical deconditioning secondary to orthopedic issues, iron deficiency anemia, and non-Hodgkin's lymphoma. He denies symptoms concerning for angina.  Euvolemic and well compensated on exam. Per Dr. Myles Gip telephone encounter dated 10/02/2021, he is at intermediate risk given cardiac history, however, this does "not preclude surgery."  Per Dr. Domenic Polite, primary cardiologist, "he is intermediate to high risk overall for surgery in light of his comorbidities and recent heart failure admission.  This is not a significantly modifiable risk however and given the situation, he should be able to proceed with resection of rectal mass as long as patient, surgeon, and anesthesiologist are comfortable proceeding." Therefore, based on ACC/AHA guidelines, patient would be at acceptable risk for the planned procedure without further cardiovascular testing. I will route this recommendation to the requesting party via Epic fax function.  12. Disposition: Follow-up as scheduled with Dr. Domenic Polite in September 2023.   Lenna Sciara, NP 12/09/2021, 9:42 AM

## 2021-12-09 ENCOUNTER — Encounter: Payer: Self-pay | Admitting: Nurse Practitioner

## 2021-12-09 ENCOUNTER — Ambulatory Visit (INDEPENDENT_AMBULATORY_CARE_PROVIDER_SITE_OTHER): Payer: PPO | Admitting: Nurse Practitioner

## 2021-12-09 VITALS — BP 140/70 | HR 68 | Ht 72.0 in | Wt 201.8 lb

## 2021-12-09 DIAGNOSIS — J90 Pleural effusion, not elsewhere classified: Secondary | ICD-10-CM

## 2021-12-09 DIAGNOSIS — N1832 Chronic kidney disease, stage 3b: Secondary | ICD-10-CM

## 2021-12-09 DIAGNOSIS — I6523 Occlusion and stenosis of bilateral carotid arteries: Secondary | ICD-10-CM

## 2021-12-09 DIAGNOSIS — R7401 Elevation of levels of liver transaminase levels: Secondary | ICD-10-CM | POA: Diagnosis not present

## 2021-12-09 DIAGNOSIS — I5033 Acute on chronic diastolic (congestive) heart failure: Secondary | ICD-10-CM

## 2021-12-09 DIAGNOSIS — E119 Type 2 diabetes mellitus without complications: Secondary | ICD-10-CM

## 2021-12-09 DIAGNOSIS — I350 Nonrheumatic aortic (valve) stenosis: Secondary | ICD-10-CM

## 2021-12-09 DIAGNOSIS — D649 Anemia, unspecified: Secondary | ICD-10-CM | POA: Diagnosis not present

## 2021-12-09 DIAGNOSIS — D509 Iron deficiency anemia, unspecified: Secondary | ICD-10-CM | POA: Diagnosis not present

## 2021-12-09 DIAGNOSIS — E782 Mixed hyperlipidemia: Secondary | ICD-10-CM | POA: Diagnosis not present

## 2021-12-09 DIAGNOSIS — I2510 Atherosclerotic heart disease of native coronary artery without angina pectoris: Secondary | ICD-10-CM | POA: Diagnosis not present

## 2021-12-09 DIAGNOSIS — Z01810 Encounter for preprocedural cardiovascular examination: Secondary | ICD-10-CM | POA: Diagnosis not present

## 2021-12-09 DIAGNOSIS — I10 Essential (primary) hypertension: Secondary | ICD-10-CM

## 2021-12-09 NOTE — Patient Instructions (Addendum)
Medication Instructions:  Your physician recommends that you continue on your current medications as directed. Please refer to the Current Medication list given to you today.   *If you need a refill on your cardiac medications before your next appointment, please call your pharmacy*   Lab Work: Your physician recommends that you complete labs today CBC, CMET  If you have labs (blood work) drawn today and your tests are completely normal, you will receive your results only by: [place] (if you have MyChart) OR A paper copy in the mail If you have any lab test that is abnormal or we need to change your treatment, we will call you to review the results.   Testing/Procedures: NONE ordered at this time of appointment    Follow-Up: At [facility], you and your health needs are our priority.  As part of our continuing mission to provide you with exceptional heart care, we have created designated Provider Care Teams.  These Care Teams include your primary Cardiologist (physician) and Advanced Practice Providers (APPs -  Physician Assistants and Nurse Practitioners) who all work together to provide you with the care you need, when you need it.  We recommend signing up for the patient portal called "MyChart".  Sign up information is provided on this After Visit Summary.  MyChart is used to connect with patients for Virtual Visits (Telemedicine).  Patients are able to view lab/test results, encounter notes, upcoming appointments, etc.  Non-urgent messages can be sent to your provider as well.   To learn more about what you can do with MyChart, go to [email or web address].    Your next appointment:   Keep 01/06/2022 Appointment   The format for your next appointment:   In Person  Provider:   Dr. Domenic Polite    Other Instructions   Important Information About Sugar

## 2021-12-10 LAB — COMPREHENSIVE METABOLIC PANEL
ALT: 45 IU/L — ABNORMAL HIGH (ref 0–44)
AST: 34 IU/L (ref 0–40)
Albumin/Globulin Ratio: 1 — ABNORMAL LOW (ref 1.2–2.2)
Albumin: 3.7 g/dL (ref 3.7–4.7)
Alkaline Phosphatase: 118 IU/L (ref 44–121)
BUN/Creatinine Ratio: 26 — ABNORMAL HIGH (ref 10–24)
BUN: 36 mg/dL — ABNORMAL HIGH (ref 8–27)
Bilirubin Total: 0.4 mg/dL (ref 0.0–1.2)
CO2: 25 mmol/L (ref 20–29)
Calcium: 9.3 mg/dL (ref 8.6–10.2)
Chloride: 105 mmol/L (ref 96–106)
Creatinine, Ser: 1.41 mg/dL — ABNORMAL HIGH (ref 0.76–1.27)
Globulin, Total: 3.8 g/dL (ref 1.5–4.5)
Glucose: 109 mg/dL — ABNORMAL HIGH (ref 70–99)
Potassium: 5.1 mmol/L (ref 3.5–5.2)
Sodium: 143 mmol/L (ref 134–144)
Total Protein: 7.5 g/dL (ref 6.0–8.5)
eGFR: 49 mL/min/{1.73_m2} — ABNORMAL LOW (ref >59)

## 2021-12-10 LAB — CBC
Hematocrit: 30 % — ABNORMAL LOW (ref 37.5–51.0)
Hemoglobin: 9.8 g/dL — ABNORMAL LOW (ref 13.0–17.7)
MCH: 31.2 pg (ref 26.6–33.0)
MCHC: 32.7 g/dL (ref 31.5–35.7)
MCV: 96 fL (ref 79–97)
Platelets: 498 10*3/uL — ABNORMAL HIGH (ref 150–450)
RBC: 3.14 x10E6/uL — ABNORMAL LOW (ref 4.14–5.80)
RDW: 12.8 % (ref 11.6–15.4)
WBC: 11.2 10*3/uL — ABNORMAL HIGH (ref 3.4–10.8)

## 2021-12-11 ENCOUNTER — Telehealth: Payer: Self-pay

## 2021-12-11 NOTE — Telephone Encounter (Signed)
Spoke with pt and pts spouse. They were notified of lab results and recommendations. Pt will continue his current medications and follow up as planned. Lab results faxed to pts PCP Dr. Woody Seller.

## 2021-12-15 ENCOUNTER — Ambulatory Visit: Payer: PPO | Admitting: Physician Assistant

## 2021-12-15 DIAGNOSIS — M62838 Other muscle spasm: Secondary | ICD-10-CM | POA: Diagnosis not present

## 2021-12-15 DIAGNOSIS — N1832 Chronic kidney disease, stage 3b: Secondary | ICD-10-CM | POA: Diagnosis not present

## 2021-12-15 DIAGNOSIS — C884 Extranodal marginal zone B-cell lymphoma of mucosa-associated lymphoid tissue [MALT-lymphoma]: Secondary | ICD-10-CM | POA: Diagnosis not present

## 2021-12-15 DIAGNOSIS — I5031 Acute diastolic (congestive) heart failure: Secondary | ICD-10-CM | POA: Diagnosis not present

## 2021-12-15 DIAGNOSIS — I130 Hypertensive heart and chronic kidney disease with heart failure and stage 1 through stage 4 chronic kidney disease, or unspecified chronic kidney disease: Secondary | ICD-10-CM | POA: Diagnosis not present

## 2021-12-15 DIAGNOSIS — E1122 Type 2 diabetes mellitus with diabetic chronic kidney disease: Secondary | ICD-10-CM | POA: Diagnosis not present

## 2021-12-15 DIAGNOSIS — M179 Osteoarthritis of knee, unspecified: Secondary | ICD-10-CM | POA: Diagnosis not present

## 2021-12-18 DIAGNOSIS — Z7982 Long term (current) use of aspirin: Secondary | ICD-10-CM | POA: Diagnosis not present

## 2021-12-18 DIAGNOSIS — L89151 Pressure ulcer of sacral region, stage 1: Secondary | ICD-10-CM | POA: Diagnosis not present

## 2021-12-18 DIAGNOSIS — G8929 Other chronic pain: Secondary | ICD-10-CM | POA: Diagnosis not present

## 2021-12-18 DIAGNOSIS — E1122 Type 2 diabetes mellitus with diabetic chronic kidney disease: Secondary | ICD-10-CM | POA: Diagnosis not present

## 2021-12-18 DIAGNOSIS — I252 Old myocardial infarction: Secondary | ICD-10-CM | POA: Diagnosis not present

## 2021-12-18 DIAGNOSIS — Z792 Long term (current) use of antibiotics: Secondary | ICD-10-CM | POA: Diagnosis not present

## 2021-12-18 DIAGNOSIS — F419 Anxiety disorder, unspecified: Secondary | ICD-10-CM | POA: Diagnosis not present

## 2021-12-18 DIAGNOSIS — C884 Extranodal marginal zone B-cell lymphoma of mucosa-associated lymphoid tissue [MALT-lymphoma]: Secondary | ICD-10-CM | POA: Diagnosis not present

## 2021-12-18 DIAGNOSIS — I130 Hypertensive heart and chronic kidney disease with heart failure and stage 1 through stage 4 chronic kidney disease, or unspecified chronic kidney disease: Secondary | ICD-10-CM | POA: Diagnosis not present

## 2021-12-18 DIAGNOSIS — K6289 Other specified diseases of anus and rectum: Secondary | ICD-10-CM | POA: Diagnosis not present

## 2021-12-18 DIAGNOSIS — I2510 Atherosclerotic heart disease of native coronary artery without angina pectoris: Secondary | ICD-10-CM | POA: Diagnosis not present

## 2021-12-18 DIAGNOSIS — I080 Rheumatic disorders of both mitral and aortic valves: Secondary | ICD-10-CM | POA: Diagnosis not present

## 2021-12-18 DIAGNOSIS — I5031 Acute diastolic (congestive) heart failure: Secondary | ICD-10-CM | POA: Diagnosis not present

## 2021-12-18 DIAGNOSIS — J45909 Unspecified asthma, uncomplicated: Secondary | ICD-10-CM | POA: Diagnosis not present

## 2021-12-18 DIAGNOSIS — R32 Unspecified urinary incontinence: Secondary | ICD-10-CM | POA: Diagnosis not present

## 2021-12-18 DIAGNOSIS — Z96652 Presence of left artificial knee joint: Secondary | ICD-10-CM | POA: Diagnosis not present

## 2021-12-18 DIAGNOSIS — D509 Iron deficiency anemia, unspecified: Secondary | ICD-10-CM | POA: Diagnosis not present

## 2021-12-18 DIAGNOSIS — N179 Acute kidney failure, unspecified: Secondary | ICD-10-CM | POA: Diagnosis not present

## 2021-12-18 DIAGNOSIS — E782 Mixed hyperlipidemia: Secondary | ICD-10-CM | POA: Diagnosis not present

## 2021-12-18 DIAGNOSIS — R7401 Elevation of levels of liver transaminase levels: Secondary | ICD-10-CM | POA: Diagnosis not present

## 2021-12-18 DIAGNOSIS — Z7984 Long term (current) use of oral hypoglycemic drugs: Secondary | ICD-10-CM | POA: Diagnosis not present

## 2021-12-18 DIAGNOSIS — N200 Calculus of kidney: Secondary | ICD-10-CM | POA: Diagnosis not present

## 2021-12-18 DIAGNOSIS — M1990 Unspecified osteoarthritis, unspecified site: Secondary | ICD-10-CM | POA: Diagnosis not present

## 2021-12-18 DIAGNOSIS — N1832 Chronic kidney disease, stage 3b: Secondary | ICD-10-CM | POA: Diagnosis not present

## 2021-12-19 NOTE — Patient Instructions (Addendum)
DUE TO SPACE LIMITATIONS, ONLY TWO VISITORS  (aged 83 and older) ARE ALLOWED TO COME WITH YOU AND STAY IN THE WAITING ROOM DURING YOUR PRE OP AND PROCEDURE.   **NO VISITORS ARE ALLOWED IN THE SHORT STAY AREA OR RECOVERY ROOM!!**  IF YOU WILL BE ADMITTED INTO THE HOSPITAL YOU ARE ALLOWED ONLY FOUR SUPPORT PEOPLE DURING VISITATION HOURS (7 AM -8PM)   The support person(s) must pass our screening, and use Hand sanitizing gel. Visitors GUEST BADGE MUST BE WORN VISIBLY  One adult visitor may remain with you overnight and MUST be in the room by 8 P.M.   You are not required to quarantine at this time prior to your surgery. However, you must do this: Hand Hygiene often Do NOT share personal items Notify your provider if you are in close contact with someone who has COVID or you develop fever 100.4 or greater, new onset of sneezing, cough, sore throat, shortness of breath or body aches.       Your procedure is scheduled on:  Wednesday  December 31, 2021  Report to [facility] Main Entrance.  Report to admitting at:  10:15   AM  +++++Call this number if you have any questions or problems the morning of surgery [phone]  Do not eat food :After Midnight the night prior to your surgery/procedure.  After Midnight you may have the following liquids until  09:30  AM DAY OF SURGERY  Clear Liquid Diet Water Black Coffee (sugar ok, NO MILK/CREAM OR CREAMERS)  Tea (sugar ok, NO MILK/CREAM OR CREAMERS) regular and decaf                             Plain Jell-O (NO RED)                                           Fruit ices (not with fruit pulp, NO RED)                                     Popsicles (NO RED)                                                                  Juice: apple, WHITE grape, WHITE cranberry Sports drinks like Gatorade (NO RED)                      The evening before surgery drink two (2)  bottles of G2.      The day of surgery:  Drink ONE (1) Pre-Surgery G2 at   09:30 AM the morning of surgery. Drink in one sitting. Do not sip.  This drink was given to you during your hospital pre-op appointment visit. Nothing else to drink after completing the Pre-Surgery Clear Ensure or G2.    FOLLOW BOWEL PREP AND ANY ADDITIONAL PRE OP INSTRUCTIONS YOU RECEIVED FROM YOUR SURGEON'S OFFICE!!!  Dulcolax 20 mg - Take one (1) tablet with water at 09:00 am the day prior to surgery.  Miralax 255 g -  Mix with 64 oz Gatorade/Powerade.  Starting at 10:00 am ,Drink this gradually over the next few hours (8 oz glass every 15-30 minutes) until gone the day prior to surgery You should finish in 4 hours-6 hours.    Neomycin 1000 mg - At 2 pm, 3 pm and 10 pm after Miralax  bowel prep the day prior to surgery.  Metronidazole 1000 mg - At 2 pm, 3 pm and 10 pm after Miralax bowel prep the day prior to surgery.   Drink plenty of clear liquids all evening to avoid getting dehydrated.      Oral Hygiene is also important to reduce your risk of infection.        Remember - BRUSH YOUR TEETH THE MORNING OF SURGERY WITH YOUR REGULAR TOOTHPASTE  Do NOT smoke or chew tobacco the day before surgery.  Take ONLY these medicines the morning of surgery with A SIP OF WATER: Carvedilol, and Tizanidine (Zanaflex) if needed  How to Manage Your Diabetes Before and After Surgery  Why is it important to control my blood sugar before and after surgery? Improving blood sugar levels before and after surgery helps healing and can limit problems. A way of improving blood sugar control is eating a healthy diet by:  Eating less sugar and carbohydrates  Increasing activity/exercise  Talking with your doctor about reaching your blood sugar goals High blood sugars (greater than 180 mg/dL) can raise your risk of infections and slow your recovery, so you will need to focus on controlling your diabetes during the weeks before surgery. Make sure that the doctor who takes care of your diabetes knows about  your planned surgery including the date and location.  How do I manage my blood sugar before surgery? Check your blood sugar at least 4 times a day, starting 2 days before surgery, to make sure that the level is not too high or low. Check your blood sugar the morning of your surgery when you wake up and every 2 hours until you get to the Short Stay unit. If your blood sugar is less than 70 mg/dL, you will need to treat for low blood sugar: Do not take insulin. Treat a low blood sugar (less than 70 mg/dL) with  cup of clear juice (cranberry or apple), 4 glucose tablets, OR glucose gel. Recheck blood sugar in 15 minutes after treatment (to make sure it is greater than 70 mg/dL). If your blood sugar is not greater than 70 mg/dL on recheck, call [phone] for further instructions. Report your blood sugar to the short stay nurse when you get to Short Stay.  If you are admitted to the hospital after surgery: Your blood sugar will be checked by the staff and you will probably be given insulin after surgery (instead of oral diabetes medicines) to make sure you have good blood sugar levels. The goal for blood sugar control after surgery is 80-180 mg/dL.   WHAT DO I DO ABOUT MY DIABETES MEDICATION?  THE DAY BEFORE SURGERY, Glipizide -  Take morning dose only, no evening dose                                                             Metformin - take usual doses in morning and evening  Actos - Take usual doses   THE MORNING OF SURGERY, Do not take any of these diabetic medications.    IF you have any questions, call the nurse at [phone]    Bring CPAP mask and tubing day of surgery.                   You may not have any metal on your body including  jewelry, and body piercing  Do not wear lotions, powders, cologne, or deodorant  Men may shave face and neck.  Contacts, Hearing Aids, dentures or bridgework may not be worn into surgery.   You may bring a small overnight bag with  you on the day of surgery, only pack items that are not valuable .[facility] IS NOT RESPONSIBLE   FOR VALUABLES THAT ARE LOST OR STOLEN.   DO NOT [place]. PHARMACY WILL DISPENSE MEDICATIONS LISTED ON YOUR MEDICATION LIST TO YOU DURING YOUR ADMISSION [place]!    Special Instructions: Bring a copy of your healthcare power of attorney and living will documents the day of surgery, if you wish to have them scanned into your [facility] Records- EPIC  Please read over the following fact sheets you were given: IF YOU HAVE QUESTIONS ABOUT YOUR PRE-OP INSTRUCTIONS, PLEASE CALL [phone]  ([place])   [facility] - Preparing for Surgery Before surgery, you can play an important role.  Because skin is not sterile, your skin needs to be as free of germs as possible.  You can reduce the number of germs on your skin by washing with CHG (chlorahexidine gluconate) soap before surgery.  CHG is an antiseptic cleaner which kills germs and bonds with the skin to continue killing germs even after washing. Please DO NOT use if you have an allergy to CHG or antibacterial soaps.  If your skin becomes reddened/irritated stop using the CHG and inform your nurse when you arrive at Short Stay. Do not shave (including legs and underarms) for at least 48 hours prior to the first CHG shower.  You may shave your face/neck.  Please follow these instructions carefully:  1.  Shower with CHG Soap the night before surgery and the  morning of surgery.  2.  If you choose to wash your hair, wash your hair first as usual with your normal  shampoo.  3.  After you shampoo, rinse your hair and body thoroughly to remove the shampoo.                             4.  Use CHG as you would any other liquid soap.  You can apply chg directly to the skin and wash.  Gently with a scrungie or clean washcloth.  5.  Apply the CHG Soap to your body ONLY FROM THE NECK DOWN.   Do not use on face/ open                            Wound or open sores. Avoid contact with eyes, ears mouth and genitals (private parts).                       Wash face,  Genitals (private parts) with your normal soap.             6.  Wash thoroughly, paying special attention to the area where your  surgery  will be performed.  7.  Thoroughly rinse your body with warm water from the neck down.  8.  DO NOT shower/wash with your normal soap after using and rinsing off the CHG Soap.            9.  Pat yourself dry with a clean towel.            10.  Wear clean pajamas.            11.  Place clean sheets on your bed the night of your first shower and do not  sleep with pets.  ON THE DAY OF SURGERY : Do not apply any lotions/deodorants the morning of surgery.  Please wear clean clothes to the hospital/surgery center.    FAILURE TO FOLLOW THESE INSTRUCTIONS MAY RESULT IN THE CANCELLATION OF YOUR SURGERY  PATIENT SIGNATURE_________________________________  NURSE SIGNATURE__________________________________  ________________________________________________________________________   Adam Phenix    An incentive spirometer is a tool that can help keep your lungs clear and active. This tool measures how well you are filling your lungs with each breath. Taking long deep breaths may help reverse or decrease the chance of developing breathing (pulmonary) problems (especially infection) following: A long period of time when you are unable to move or be active. BEFORE THE PROCEDURE  If the spirometer includes an indicator to show your best effort, your nurse or respiratory therapist will set it to a desired goal. If possible, sit up straight or lean slightly forward. Try not to slouch. Hold the incentive spirometer in an upright position. INSTRUCTIONS FOR USE  Sit on the edge of your bed if possible, or sit up as far as you can in bed or on a chair. Hold the incentive spirometer in an upright position. Breathe out  normally. Place the mouthpiece in your mouth and seal your lips tightly around it. Breathe in slowly and as deeply as possible, raising the piston or the ball toward the top of the column. Hold your breath for 3-5 seconds or for as long as possible. Allow the piston or ball to fall to the bottom of the column. Remove the mouthpiece from your mouth and breathe out normally. Rest for a few seconds and repeat Steps 1 through 7 at least 10 times every 1-2 hours when you are awake. Take your time and take a few normal breaths between deep breaths. The spirometer may include an indicator to show your best effort. Use the indicator as a goal to work toward during each repetition. After each set of 10 deep breaths, practice coughing to be sure your lungs are clear. If you have an incision (the cut made at the time of surgery), support your incision when coughing by placing a pillow or rolled up towels firmly against it. Once you are able to get out of bed, walk around indoors and cough well. You may stop using the incentive spirometer when instructed by your caregiver.  RISKS AND COMPLICATIONS Take your time so you do not get dizzy or light-headed. If you are in pain, you may need to take or ask for pain medication before doing incentive spirometry. It is harder to take a deep breath if you are having pain. AFTER USE Rest and breathe slowly and easily. It can be helpful to keep track of a log of your progress. Your caregiver can provide you with a simple table to help with this. If you are using the spirometer at home, follow these instructions: [facility]  CARE IF:  You are having difficultly using the spirometer. You have trouble using the spirometer as often as instructed. Your pain medication is not giving enough relief while using the spirometer. You develop fever of 100.5 F (38.1 C) or higher.                                                                                                     SEEK IMMEDIATE MEDICAL CARE IF:  You cough up bloody sputum that had not been present before. You develop fever of 102 F (38.9 C) or greater. You develop worsening pain at or near the incision site. MAKE SURE YOU:  Understand these instructions. Will watch your condition. Will get help right away if you are not doing well or get worse. Document Released: 08/31/2006 Document Revised: 07/13/2011 Document Reviewed: 11/01/2006 [facility] Patient Information 2014 [place], [place].

## 2021-12-19 NOTE — Progress Notes (Signed)
COVID Vaccine received:  '[x]'$  No '[]'$  Yes Date of any COVID positive Test in last 90 days: none  PCP - Jerene Bears, MD Cardiologist - Rozann Lesches, MD   Cardiac clearance by Diona Browner, NP on 12-09-21 EPIC  Chest x-ray - n/a EKG - 11-27-21  Epic  Stress Test - 2014 Epic ECHO - 11-24-21  Epic Cardiac Cath - 01-2002  DES x 2 placed  Pacemaker/ICD device     '[x]'$  N/A Spinal Cord Stimulator:'[x]'$  No '[]'$  Yes   Other Implants:   Bowel Prep - clears, Dulcolax, Miralax, Neomycin, Flagyl  History of Sleep Apnea? '[x]'$  No '[]'$  Yes   Sleep Study Date:   CPAP used?- '[x]'$  No '[]'$  Yes  (Instruct to bring their mask & Tubing)  Does the patient monitor blood sugar? '[]'$  No '[x]'$  Yes  '[]'$  N/A Does patient have a [employer] or Dexacom? '[x]'$  No '[]'$  Yes   Fasting Blood Sugar Ranges- 72-120 Checks Blood Sugar _2____ times a day  Blood Thinner Instructions:  none Aspirin Instructions: 81 mg Last Dose:  ERAS Protocol Ordered: '[]'$  No  '[x]'$  Yes PRE-SURGERY '[]'$  ENSURE  '[x]'$  G2 X 3 bottles given to patient at PST appt  Comments: Mild cognitive impairment, wife or daughter needs to be with the patient  Activity level: Patient can not climb a flight of stairs without difficulty;  '[x]'$  No CP   but would have SOB, back/_leg pain__   Anesthesia review: CHF, CAD- DES placed, Hx MALT lymphoma, moderate AS, chronic anemia( hgb 9.8 on 12-09-21) , CKD3b,  Hx Pleural effusions.  Patient denies shortness of breath, fever, cough and chest pain at PAT appointment.  Patient verbalized understanding and agreement to the Pre-Surgical Instructions that were given to them at this PAT appointment. Patient was also educated of the need to review these PAT instructions again prior to his/her surgery.I reviewed the appropriate phone numbers to call if they have any and questions or concerns.

## 2021-12-22 ENCOUNTER — Encounter (HOSPITAL_COMMUNITY): Payer: Self-pay

## 2021-12-22 ENCOUNTER — Other Ambulatory Visit: Payer: Self-pay

## 2021-12-22 ENCOUNTER — Encounter (HOSPITAL_COMMUNITY)
Admission: RE | Admit: 2021-12-22 | Discharge: 2021-12-22 | Disposition: A | Discharge status: Home / Self Care | Payer: PPO | Source: Ambulatory Visit | Attending: Surgery | Admitting: Surgery

## 2021-12-22 VITALS — BP 154/69 | HR 58 | Temp 97.9°F | Resp 20 | Ht 72.0 in | Wt 200.0 lb

## 2021-12-22 DIAGNOSIS — J45909 Unspecified asthma, uncomplicated: Secondary | ICD-10-CM | POA: Insufficient documentation

## 2021-12-22 DIAGNOSIS — E1122 Type 2 diabetes mellitus with diabetic chronic kidney disease: Secondary | ICD-10-CM | POA: Diagnosis not present

## 2021-12-22 DIAGNOSIS — I129 Hypertensive chronic kidney disease with stage 1 through stage 4 chronic kidney disease, or unspecified chronic kidney disease: Secondary | ICD-10-CM | POA: Insufficient documentation

## 2021-12-22 DIAGNOSIS — Z01818 Encounter for other preprocedural examination: Secondary | ICD-10-CM | POA: Insufficient documentation

## 2021-12-22 DIAGNOSIS — E119 Type 2 diabetes mellitus without complications: Secondary | ICD-10-CM

## 2021-12-22 DIAGNOSIS — K621 Rectal polyp: Secondary | ICD-10-CM | POA: Diagnosis not present

## 2021-12-22 DIAGNOSIS — I10 Essential (primary) hypertension: Secondary | ICD-10-CM

## 2021-12-22 HISTORY — DX: Gastric ulcer, unspecified as acute or chronic, without hemorrhage or perforation: K25.9

## 2021-12-22 LAB — GLUCOSE, CAPILLARY: Glucose-Capillary: 104 mg/dL — ABNORMAL HIGH (ref 70–99)

## 2021-12-23 ENCOUNTER — Encounter: Payer: Self-pay | Admitting: Cardiology

## 2021-12-23 DIAGNOSIS — Z96652 Presence of left artificial knee joint: Secondary | ICD-10-CM | POA: Diagnosis not present

## 2021-12-23 DIAGNOSIS — I252 Old myocardial infarction: Secondary | ICD-10-CM | POA: Diagnosis not present

## 2021-12-23 DIAGNOSIS — K6289 Other specified diseases of anus and rectum: Secondary | ICD-10-CM | POA: Diagnosis not present

## 2021-12-23 DIAGNOSIS — I080 Rheumatic disorders of both mitral and aortic valves: Secondary | ICD-10-CM | POA: Diagnosis not present

## 2021-12-23 DIAGNOSIS — R32 Unspecified urinary incontinence: Secondary | ICD-10-CM | POA: Diagnosis not present

## 2021-12-23 DIAGNOSIS — Z792 Long term (current) use of antibiotics: Secondary | ICD-10-CM | POA: Diagnosis not present

## 2021-12-23 DIAGNOSIS — R7401 Elevation of levels of liver transaminase levels: Secondary | ICD-10-CM | POA: Diagnosis not present

## 2021-12-23 DIAGNOSIS — M1990 Unspecified osteoarthritis, unspecified site: Secondary | ICD-10-CM | POA: Diagnosis not present

## 2021-12-23 DIAGNOSIS — C884 Extranodal marginal zone B-cell lymphoma of mucosa-associated lymphoid tissue [MALT-lymphoma]: Secondary | ICD-10-CM | POA: Diagnosis not present

## 2021-12-23 DIAGNOSIS — N200 Calculus of kidney: Secondary | ICD-10-CM | POA: Diagnosis not present

## 2021-12-23 DIAGNOSIS — J45909 Unspecified asthma, uncomplicated: Secondary | ICD-10-CM | POA: Diagnosis not present

## 2021-12-23 DIAGNOSIS — E1122 Type 2 diabetes mellitus with diabetic chronic kidney disease: Secondary | ICD-10-CM | POA: Diagnosis not present

## 2021-12-23 DIAGNOSIS — E782 Mixed hyperlipidemia: Secondary | ICD-10-CM | POA: Diagnosis not present

## 2021-12-23 DIAGNOSIS — L89151 Pressure ulcer of sacral region, stage 1: Secondary | ICD-10-CM | POA: Diagnosis not present

## 2021-12-23 DIAGNOSIS — G8929 Other chronic pain: Secondary | ICD-10-CM | POA: Diagnosis not present

## 2021-12-23 DIAGNOSIS — I5031 Acute diastolic (congestive) heart failure: Secondary | ICD-10-CM | POA: Diagnosis not present

## 2021-12-23 DIAGNOSIS — I130 Hypertensive heart and chronic kidney disease with heart failure and stage 1 through stage 4 chronic kidney disease, or unspecified chronic kidney disease: Secondary | ICD-10-CM | POA: Diagnosis not present

## 2021-12-23 DIAGNOSIS — I2510 Atherosclerotic heart disease of native coronary artery without angina pectoris: Secondary | ICD-10-CM | POA: Diagnosis not present

## 2021-12-23 DIAGNOSIS — Z7982 Long term (current) use of aspirin: Secondary | ICD-10-CM | POA: Diagnosis not present

## 2021-12-23 DIAGNOSIS — N179 Acute kidney failure, unspecified: Secondary | ICD-10-CM | POA: Diagnosis not present

## 2021-12-23 DIAGNOSIS — N1832 Chronic kidney disease, stage 3b: Secondary | ICD-10-CM | POA: Diagnosis not present

## 2021-12-23 DIAGNOSIS — F419 Anxiety disorder, unspecified: Secondary | ICD-10-CM | POA: Diagnosis not present

## 2021-12-23 DIAGNOSIS — D509 Iron deficiency anemia, unspecified: Secondary | ICD-10-CM | POA: Diagnosis not present

## 2021-12-23 DIAGNOSIS — Z7984 Long term (current) use of oral hypoglycemic drugs: Secondary | ICD-10-CM | POA: Diagnosis not present

## 2021-12-23 NOTE — Anesthesia Preprocedure Evaluation (Addendum)
Anesthesia Evaluation  Patient identified by MRN, date of birth, ID band Patient awake    Reviewed: Allergy & Precautions, NPO status , Patient's Chart, lab work & pertinent test results, reviewed documented beta blocker date and time   Airway Mallampati: II  TM Distance: >3 FB Neck ROM: Full    Dental  (+) Dental Advisory Given, Edentulous Upper, Edentulous Lower   Pulmonary asthma , former smoker,    Pulmonary exam normal breath sounds clear to auscultation       Cardiovascular hypertension, Pt. on home beta blockers and Pt. on medications + CAD, + Past MI, + Cardiac Stents, + Peripheral Vascular Disease and +CHF  + Valvular Problems/Murmurs AS and MR  Rhythm:Regular Rate:Normal + Systolic murmurs Echo 3/71/69: 1. Left ventricular ejection fraction, by estimation, is 65 to 70%. The  left ventricle has normal function. The left ventricle has no regional  wall motion abnormalities. There is moderate concentric left ventricular  hypertrophy. Left ventricular  diastolic parameters are consistent with Grade II diastolic dysfunction  (pseudonormalization). Elevated left ventricular end-diastolic pressure.  2. Right ventricular systolic function is normal. The right ventricular  size is normal. Tricuspid regurgitation signal is inadequate for assessing  PA pressure.  3. Left atrial size was severely dilated.  4. Right atrial size was moderately dilated.  5. A small pericardial effusion is present. The pericardial effusion is  posterior to the left ventricle and anterior to the right ventricle. There  is no evidence of cardiac tamponade.  6. The mitral valve is degenerative. Trivial mitral valve regurgitation.  7. The aortic valve is tricuspid with restricted motion of the left  coronary cusp. There is moderate calcification of the aortic valve.  Moderate aortic valve stenosis. Aortic valve mean gradient measures 18.0  mmHg.  Dimentionless index 0.44.  8. The inferior vena cava is dilated in size with >50% respiratory  variability, suggesting right atrial pressure of 8 mmHg.    Neuro/Psych PSYCHIATRIC DISORDERS Anxiety    GI/Hepatic Neg liver ROS, PUD, GERD  ,RECTAL POLYP   Endo/Other  diabetes, Type 2, Oral Hypoglycemic Agents  Renal/GU Renal InsufficiencyRenal disease     Musculoskeletal  (+) Arthritis ,   Abdominal   Peds  Hematology non-Hodgkin's lymphoma   Anesthesia Other Findings   Reproductive/Obstetrics                            Anesthesia Physical Anesthesia Plan  ASA: 4  Anesthesia Plan: General   Post-op Pain Management: Tylenol PO (pre-op)*   Induction: Intravenous  PONV Risk Score and Plan: 2 and Dexamethasone and Ondansetron  Airway Management Planned: Oral ETT  Additional Equipment:   Intra-op Plan:   Post-operative Plan: Extubation in OR  Informed Consent: I have reviewed the patients History and Physical, chart, labs and discussed the procedure including the risks, benefits and alternatives for the proposed anesthesia with the patient or authorized representative who has indicated his/her understanding and acceptance.     Dental advisory given  Plan Discussed with: CRNA  Anesthesia Plan Comments: (See PAT note 12/22/2021)       Anesthesia Quick Evaluation

## 2021-12-23 NOTE — Telephone Encounter (Signed)
Error

## 2021-12-23 NOTE — Progress Notes (Signed)
Anesthesia Chart Review   Case: [id number] Date/Time: 12/31/21 1215   Procedure: TEM PARTIAL PROTECTOMY   Anesthesia type: General   Pre-op diagnosis: RECTAL POLYP   Location: [facility] ROOM 01 / [facility]   Surgeons: Michael Boston, MD       DISCUSSION:81 y.o. former smoker with h/o asthma, HTN, CAD, moderate AS (mean gradient 18 mmHg, valve area 1.37 cm2), DM II, CKD Stage III, non-Hodgkin's lymphoma, rectal polyp scheduled for above procedure 12/31/2021 with Dr. Michael Boston.   Per cardiology note, "he is intermediate to high risk overall for surgery in light of his comorbidities and recent heart failure admission.  This is not a significantly modifiable risk however and given the situation, he should be able to proceed with resection of rectal mass as long as patient, surgeon, and anesthesiologist are comfortable proceeding."  Anticipate pt can proceed with planned procedure barring acute status change and after evaluation DOS>  VS: BP (!) 154/69   Pulse (!) 58   Temp 36.6 C (Oral)   Resp 20   Ht 6' (1.829 m)   Wt 90.7 kg   SpO2 100%   BMI 27.12 kg/m   PROVIDERS: [name], MD is PCP   Cardiologist - Rozann Lesches, MD LABS: Labs reviewed: Acceptable for surgery. (all labs ordered are listed, but only abnormal results are displayed)  Labs Reviewed  GLUCOSE, CAPILLARY - Abnormal; Notable for the following components:      Result Value   Glucose-Capillary 104 (*)    All other components within normal limits     IMAGES:   EKG:   CV: Echo 11/24/2021 1. Left ventricular ejection fraction, by estimation, is 65 to 70%. The  left ventricle has normal function. The left ventricle has no regional  wall motion abnormalities. There is moderate concentric left ventricular  hypertrophy. Left ventricular  diastolic parameters are consistent with Grade II diastolic dysfunction  (pseudonormalization). Elevated left ventricular end-diastolic pressure.   2. Right ventricular systolic  function is normal. The right ventricular  size is normal. Tricuspid regurgitation signal is inadequate for assessing  PA pressure.   3. Left atrial size was severely dilated.   4. Right atrial size was moderately dilated.   5. A small pericardial effusion is present. The pericardial effusion is  posterior to the left ventricle and anterior to the right ventricle. There  is no evidence of cardiac tamponade.   6. The mitral valve is degenerative. Trivial mitral valve regurgitation.   7. The aortic valve is tricuspid with restricted motion of the left  coronary cusp. There is moderate calcification of the aortic valve.  Moderate aortic valve stenosis. Aortic valve mean gradient measures 18.0  mmHg. Dimentionless index 0.44.   8. The inferior vena cava is dilated in size with >50% respiratory  variability, suggesting right atrial pressure of 8 mmHg.  Past Medical History:  Diagnosis Date   Anxiety    Arthritis    Asthma    Chronic pain    CKD (chronic kidney disease) stage 3, GFR 30-59 ml/min (HCC)    Coronary atherosclerosis of native coronary artery    Multvessel, DES to LAD and RCA 8/03; EF 65% by echo 11/2014   DM2 (diabetes mellitus, type 2) (HCC)    Essential hypertension    Gastric ulcer    Gastritis    Related to NSAIDs   GERD (gastroesophageal reflux disease)    Iron deficiency anemia    Iron deficiency anemia 07/28/2021   Mitral valve regurgitation  Mixed hyperlipidemia    Myocardial infarction [facility])    Nephrolithiasis     Past Surgical History:  Procedure Laterality Date   APPENDECTOMY     BIOPSY  09/25/2021   Procedure: BIOPSY;  Surgeon: Daneil Dolin, MD;  Location[facility];  Service: Endoscopy;;   CARDIAC CATHETERIZATION  01/2002   90% obstruction in proximal LAD, 60 % obstruction in proximal circumflex and 70%  in proximal RCA. LAD & RCA stented  stents x 2   COLONOSCOPY WITH PROPOFOL N/A 09/25/2021   Procedure: COLONOSCOPY WITH PROPOFOL;  Surgeon:  Daneil Dolin, MD;  Location: [facility];  Service: Endoscopy;  Laterality: N/A;  11:00am   L knee replacement  2009   Subsequent infectino requiring resectino arthroplasty with incision and drainage 8/10, and reimplantizathion arthroplasty, 9/20. 5 total surgeries.    MEDICATIONS:  acetaminophen (TYLENOL) 500 MG tablet   amoxicillin-clavulanate (AUGMENTIN) 875-125 MG tablet   aspirin EC 81 MG tablet   carvedilol (COREG) 12.5 MG tablet   ferrous sulfate 324 MG TBEC   furosemide (LASIX) 40 MG tablet   glipiZIDE (GLUCOTROL XL) 10 MG 24 hr tablet   LORazepam (ATIVAN) 1 MG tablet   metFORMIN (GLUCOPHAGE) 500 MG tablet   nitroGLYCERIN (NITROSTAT) 0.4 MG SL tablet   pioglitazone (ACTOS) 45 MG tablet   polyethylene glycol-electrolytes (TRILYTE) 420 g solution   rosuvastatin (CRESTOR) 5 MG tablet   tiZANidine (ZANAFLEX) 2 MG tablet   vitamin B-12 (CYANOCOBALAMIN) 500 MCG tablet   vitamin C (ASCORBIC ACID) 500 MG tablet   No current facility-administered medications for this encounter.    Konrad Felix Ward, PA-C [facility]-Surgical Testing [phone]

## 2021-12-24 ENCOUNTER — Ambulatory Visit (INDEPENDENT_AMBULATORY_CARE_PROVIDER_SITE_OTHER): Payer: PPO | Admitting: Cardiology

## 2021-12-24 ENCOUNTER — Encounter: Payer: Self-pay | Admitting: Cardiology

## 2021-12-24 VITALS — BP 120/60 | HR 66 | Ht 72.0 in | Wt 201.6 lb

## 2021-12-24 DIAGNOSIS — I350 Nonrheumatic aortic (valve) stenosis: Secondary | ICD-10-CM | POA: Diagnosis not present

## 2021-12-24 DIAGNOSIS — I5032 Chronic diastolic (congestive) heart failure: Secondary | ICD-10-CM

## 2021-12-24 DIAGNOSIS — N1832 Chronic kidney disease, stage 3b: Secondary | ICD-10-CM

## 2021-12-24 DIAGNOSIS — I25119 Atherosclerotic heart disease of native coronary artery with unspecified angina pectoris: Secondary | ICD-10-CM | POA: Diagnosis not present

## 2021-12-24 NOTE — Patient Instructions (Signed)
Medication Instructions:  Your physician recommends that you continue on your current medications as directed. Please refer to the Current Medication list given to you today.  Labwork: none  Testing/Procedures: none  Follow-Up: Your physician recommends that you schedule a follow-up appointment in: 2 months  Any Other Special Instructions Will Be Listed Below (If Applicable).  If you need a refill on your cardiac medications before your next appointment, please call your pharmacy. 

## 2021-12-24 NOTE — Progress Notes (Signed)
Cardiology Office Note  Date: 12/24/2021   ID: Bradley, Sheppard 1940-11-12, MRN [id number]  PCP:  [name], MD  Cardiologist:  Rozann Lesches, MD Electrophysiologist:  None   Chief Complaint  Patient presents with   Cardiac follow-up    History of Present Illness: Bradley Sheppard is an 81 y.o. male seen recently on August 8 by Ms. Monge NP, I reviewed the note and prior interval hospital stay.  He is here today with family members for a follow-up visit.  In a wheelchair today, has PT at home twice a week in anticipation of upcoming surgery.  He reports NYHA class II dyspnea, no chest pain or palpitations at this time.  No orthopnea or PND.  His weight is stable.  He is scheduled to undergo partial proctectomy on August 30 with Dr. Johney Maine.  His overall perioperative cardiac risk is intermediate to high range in light of comorbid illnesses and recent heart failure admission, although not a substantially modifiable risk.  He is clinically stable at this point on present regimen.  I reviewed his recent echocardiogram as noted below.  LVEF 65 to 70% with moderate diastolic dysfunction, normal RV contraction, and moderate calcific aortic stenosis.  Blood pressure is normal today.  Past Medical History:  Diagnosis Date   (HFpEF) heart failure with preserved ejection fraction (HCC)    Anxiety    Aortic stenosis    Arthritis    Asthma    Chronic pain    CKD (chronic kidney disease) stage 3, GFR 30-59 ml/min (HCC)    Coronary atherosclerosis of native coronary artery    Multvessel, DES to LAD and RCA 8/03; EF 65% by echo 11/2014   DM2 (diabetes mellitus, type 2) ([place])    Essential hypertension    Gastric ulcer    Gastritis    Related to NSAIDs   GERD (gastroesophageal reflux disease)    Iron deficiency anemia    Mitral valve regurgitation    Mixed hyperlipidemia    Myocardial infarction [facility])    Nephrolithiasis     Past Surgical History:  Procedure Laterality  Date   APPENDECTOMY     BIOPSY  09/25/2021   Procedure: BIOPSY;  Surgeon: Daneil Dolin, MD;  [facility];  Service: Endoscopy;;   CARDIAC CATHETERIZATION  01/2002   90% obstruction in proximal LAD, 60 % obstruction in proximal circumflex and 70%  in proximal RCA. LAD & RCA stented  stents x 2   COLONOSCOPY WITH PROPOFOL N/A 09/25/2021   Procedure: COLONOSCOPY WITH PROPOFOL;  Surgeon: Daneil Dolin, MD;  [facility];  Service: Endoscopy;  Laterality: N/A;  11:00am   L knee replacement  2009   Subsequent infectino requiring resectino arthroplasty with incision and drainage 8/10, and reimplantizathion arthroplasty, 9/20. 5 total surgeries.    Current Outpatient Medications  Medication Sig Dispense Refill   acetaminophen (TYLENOL) 500 MG tablet Take 1,000 mg by mouth every 6 (six) hours as needed for moderate pain.     aspirin EC 81 MG tablet Take 81 mg by mouth daily. Swallow whole.     carvedilol (COREG) 12.5 MG tablet Take 1 tablet (12.5 mg total) by mouth 2 (two) times daily with a meal. 60 tablet 1   ferrous sulfate 324 MG TBEC Take 324 mg by mouth 2 (two) times daily.     furosemide (LASIX) 40 MG tablet Take 1 tablet (40 mg total) by mouth daily. 30 tablet 1  glipiZIDE (GLUCOTROL XL) 10 MG 24 hr tablet Take 10 mg by mouth 2 (two) times daily.     LORazepam (ATIVAN) 1 MG tablet Take 1 mg by mouth at bedtime.     metFORMIN (GLUCOPHAGE) 500 MG tablet Take 500-1,000 mg by mouth See admin instructions. Take 1000 mg in the morning and 500 mg at night     nitroGLYCERIN (NITROSTAT) 0.4 MG SL tablet Place 1 tablet (0.4 mg total) under the tongue every 5 (five) minutes as needed. 25 tablet 3   pioglitazone (ACTOS) 45 MG tablet Take 45 mg by mouth daily.     rosuvastatin (CRESTOR) 5 MG tablet Take 5 mg by mouth every Wednesday.     tiZANidine (ZANAFLEX) 2 MG tablet Take 2 mg by mouth 2 (two) times daily.     vitamin B-12 (CYANOCOBALAMIN) 500 MCG tablet Take 500 mcg by  mouth daily.     vitamin C (ASCORBIC ACID) 500 MG tablet Take 500 mg by mouth at bedtime.     No current facility-administered medications for this visit.   Allergies:  Prednisone   ROS: Hearing loss.  Physical Exam: VS:  BP 120/60 (BP Location: Left Arm, Patient Position: Sitting, Cuff Size: Normal)   Pulse 66   Ht 6' (1.829 m)   Wt 201 lb 9.6 oz (91.4 kg)   SpO2 97%   BMI 27.34 kg/m , BMI Body mass index is 27.34 kg/m.  Wt Readings from Last 3 Encounters:  12/24/21 201 lb 9.6 oz (91.4 kg)  12/22/21 200 lb (90.7 kg)  12/09/21 201 lb 12.8 oz (91.5 kg)    General: Patient appears comfortable at rest. HEENT: Conjunctiva and lids normal, oropharynx clear with moist mucosa. Neck: Supple, no elevated JVP or carotid bruits, no thyromegaly. Lungs: Clear to auscultation, nonlabored breathing at rest. Cardiac: Regular rate and rhythm, no S3, 2/6 systolic murmur, no pericardial rub. Abdomen: Soft, nontender, bowel sounds present. Extremities: Chronic appearing edema.  ECG:  An ECG dated 11/27/2021 was personally reviewed today and demonstrated:  Sinus rhythm with prolonged PR interval, LVH and IVCD.  Recent Labwork: 11/26/2021: B Natriuretic Peptide 177.0 11/28/2021: Magnesium 2.1 12/09/2021: ALT 45; AST 34; BUN 36; Creatinine, Ser 1.41; Hemoglobin 9.8; Platelets 498; Potassium 5.1; Sodium 143   Other Studies Reviewed Today:  Echocardiogram 11/24/2021:  1. Left ventricular ejection fraction, by estimation, is 65 to 70%. The  left ventricle has normal function. The left ventricle has no regional  wall motion abnormalities. There is moderate concentric left ventricular  hypertrophy. Left ventricular  diastolic parameters are consistent with Grade II diastolic dysfunction  (pseudonormalization). Elevated left ventricular end-diastolic pressure.   2. Right ventricular systolic function is normal. The right ventricular  size is normal. Tricuspid regurgitation signal is inadequate for  assessing  PA pressure.   3. Left atrial size was severely dilated.   4. Right atrial size was moderately dilated.   5. A small pericardial effusion is present. The pericardial effusion is  posterior to the left ventricle and anterior to the right ventricle. There  is no evidence of cardiac tamponade.   6. The mitral valve is degenerative. Trivial mitral valve regurgitation.   7. The aortic valve is tricuspid with restricted motion of the left  coronary cusp. There is moderate calcification of the aortic valve.  Moderate aortic valve stenosis. Aortic valve mean gradient measures 18.0  mmHg. Dimentionless index 0.44.   8. The inferior vena cava is dilated in size with >50% respiratory  variability, suggesting right  atrial pressure of 8 mmHg.   Assessment and Plan:  1.  HFpEF with recent hospitalization for acute exacerbation.  LVEF 65 to 70% with moderate diastolic dysfunction, RV contraction normal.  He is status post bilateral thoracenteses for management of pleural effusions and underwent IV diuresis, now tolerating oral Lasix with stable weight.  2.  CAD status post DES to the LAD and RCA in 2003.  We have managed him medically in the absence of progressive angina symptoms.  Currently on aspirin, Coreg, Crestor, and as needed nitroglycerin.  3.  Moderate calcific aortic stenosis with mean gradient 18 mmHg and dimensionless index 0.44.  Continue observation for now.  4.  Essential hypertension, blood pressure is normal today on current regimen which was simplified during his recent hospital stay.  5.  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  6.  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  7.  Rectal mass with plan for partial proctectomy on August 30 with Dr. Johney Maine.  Perioperative cardiac risk overall intermediate to high range as discussed above, not modifiable at this time, however clinically stable and should be able to proceed.  Medication Adjustments/Labs and Tests  Ordered: Current medicines are reviewed at length with the patient today.  Concerns regarding medicines are outlined above.   Tests Ordered: No orders of the defined types were placed in this encounter.   Medication Changes: No orders of the defined types were placed in this encounter.   Disposition:  Follow up  2 months.  Signed, Satira Sark, MD, [facility] 12/24/2021 3:48 PM    [facility] [place], [place], [place] [address] Phone: [phone]; Fax: [phone]

## 2021-12-25 ENCOUNTER — Other Ambulatory Visit: Payer: PPO

## 2021-12-25 ENCOUNTER — Ambulatory Visit: Payer: PPO | Admitting: Hematology

## 2021-12-29 DIAGNOSIS — F419 Anxiety disorder, unspecified: Secondary | ICD-10-CM | POA: Diagnosis not present

## 2021-12-29 DIAGNOSIS — Z7982 Long term (current) use of aspirin: Secondary | ICD-10-CM | POA: Diagnosis not present

## 2021-12-29 DIAGNOSIS — Z96652 Presence of left artificial knee joint: Secondary | ICD-10-CM | POA: Diagnosis not present

## 2021-12-29 DIAGNOSIS — J45909 Unspecified asthma, uncomplicated: Secondary | ICD-10-CM | POA: Diagnosis not present

## 2021-12-29 DIAGNOSIS — I130 Hypertensive heart and chronic kidney disease with heart failure and stage 1 through stage 4 chronic kidney disease, or unspecified chronic kidney disease: Secondary | ICD-10-CM | POA: Diagnosis not present

## 2021-12-29 DIAGNOSIS — R32 Unspecified urinary incontinence: Secondary | ICD-10-CM | POA: Diagnosis not present

## 2021-12-29 DIAGNOSIS — I2510 Atherosclerotic heart disease of native coronary artery without angina pectoris: Secondary | ICD-10-CM | POA: Diagnosis not present

## 2021-12-29 DIAGNOSIS — K6289 Other specified diseases of anus and rectum: Secondary | ICD-10-CM | POA: Diagnosis not present

## 2021-12-29 DIAGNOSIS — E782 Mixed hyperlipidemia: Secondary | ICD-10-CM | POA: Diagnosis not present

## 2021-12-29 DIAGNOSIS — C884 Extranodal marginal zone B-cell lymphoma of mucosa-associated lymphoid tissue [MALT-lymphoma]: Secondary | ICD-10-CM | POA: Diagnosis not present

## 2021-12-29 DIAGNOSIS — G8929 Other chronic pain: Secondary | ICD-10-CM | POA: Diagnosis not present

## 2021-12-29 DIAGNOSIS — R7401 Elevation of levels of liver transaminase levels: Secondary | ICD-10-CM | POA: Diagnosis not present

## 2021-12-29 DIAGNOSIS — Z792 Long term (current) use of antibiotics: Secondary | ICD-10-CM | POA: Diagnosis not present

## 2021-12-29 DIAGNOSIS — Z7984 Long term (current) use of oral hypoglycemic drugs: Secondary | ICD-10-CM | POA: Diagnosis not present

## 2021-12-29 DIAGNOSIS — E1122 Type 2 diabetes mellitus with diabetic chronic kidney disease: Secondary | ICD-10-CM | POA: Diagnosis not present

## 2021-12-29 DIAGNOSIS — N179 Acute kidney failure, unspecified: Secondary | ICD-10-CM | POA: Diagnosis not present

## 2021-12-29 DIAGNOSIS — N200 Calculus of kidney: Secondary | ICD-10-CM | POA: Diagnosis not present

## 2021-12-29 DIAGNOSIS — I252 Old myocardial infarction: Secondary | ICD-10-CM | POA: Diagnosis not present

## 2021-12-29 DIAGNOSIS — N1832 Chronic kidney disease, stage 3b: Secondary | ICD-10-CM | POA: Diagnosis not present

## 2021-12-29 DIAGNOSIS — D509 Iron deficiency anemia, unspecified: Secondary | ICD-10-CM | POA: Diagnosis not present

## 2021-12-29 DIAGNOSIS — L89151 Pressure ulcer of sacral region, stage 1: Secondary | ICD-10-CM | POA: Diagnosis not present

## 2021-12-29 DIAGNOSIS — I080 Rheumatic disorders of both mitral and aortic valves: Secondary | ICD-10-CM | POA: Diagnosis not present

## 2021-12-29 DIAGNOSIS — I5031 Acute diastolic (congestive) heart failure: Secondary | ICD-10-CM | POA: Diagnosis not present

## 2021-12-29 DIAGNOSIS — M1990 Unspecified osteoarthritis, unspecified site: Secondary | ICD-10-CM | POA: Diagnosis not present

## 2021-12-31 ENCOUNTER — Encounter (HOSPITAL_COMMUNITY)
Admission: RE | Disposition: A | Discharge status: Home Health | Payer: Self-pay | Source: Ambulatory Visit | Attending: Surgery

## 2021-12-31 ENCOUNTER — Inpatient Hospital Stay (HOSPITAL_COMMUNITY)
Admission: RE | Admit: 2021-12-31 | Discharge: 2022-01-02 | DRG: 348 | Disposition: A | Discharge status: Home Health | Payer: PPO | Source: Ambulatory Visit | Attending: Surgery | Admitting: Surgery

## 2021-12-31 ENCOUNTER — Inpatient Hospital Stay (HOSPITAL_COMMUNITY): Payer: PPO | Admitting: Anesthesiology

## 2021-12-31 ENCOUNTER — Inpatient Hospital Stay (HOSPITAL_COMMUNITY): Payer: PPO | Admitting: Physician Assistant

## 2021-12-31 ENCOUNTER — Encounter (HOSPITAL_COMMUNITY): Payer: Self-pay | Admitting: Surgery

## 2021-12-31 DIAGNOSIS — K6289 Other specified diseases of anus and rectum: Principal | ICD-10-CM | POA: Diagnosis present

## 2021-12-31 DIAGNOSIS — L8990 Pressure ulcer of unspecified site, unspecified stage: Secondary | ICD-10-CM | POA: Diagnosis not present

## 2021-12-31 DIAGNOSIS — Z87442 Personal history of urinary calculi: Secondary | ICD-10-CM

## 2021-12-31 DIAGNOSIS — E782 Mixed hyperlipidemia: Secondary | ICD-10-CM | POA: Diagnosis present

## 2021-12-31 DIAGNOSIS — Z823 Family history of stroke: Secondary | ICD-10-CM

## 2021-12-31 DIAGNOSIS — M1990 Unspecified osteoarthritis, unspecified site: Secondary | ICD-10-CM | POA: Diagnosis not present

## 2021-12-31 DIAGNOSIS — N1832 Chronic kidney disease, stage 3b: Secondary | ICD-10-CM | POA: Diagnosis present

## 2021-12-31 DIAGNOSIS — E1122 Type 2 diabetes mellitus with diabetic chronic kidney disease: Secondary | ICD-10-CM | POA: Diagnosis present

## 2021-12-31 DIAGNOSIS — Z7984 Long term (current) use of oral hypoglycemic drugs: Secondary | ICD-10-CM

## 2021-12-31 DIAGNOSIS — K621 Rectal polyp: Principal | ICD-10-CM | POA: Diagnosis present

## 2021-12-31 DIAGNOSIS — Z8711 Personal history of peptic ulcer disease: Secondary | ICD-10-CM

## 2021-12-31 DIAGNOSIS — K5900 Constipation, unspecified: Secondary | ICD-10-CM | POA: Diagnosis not present

## 2021-12-31 DIAGNOSIS — I5031 Acute diastolic (congestive) heart failure: Secondary | ICD-10-CM | POA: Diagnosis not present

## 2021-12-31 DIAGNOSIS — Z96652 Presence of left artificial knee joint: Secondary | ICD-10-CM | POA: Diagnosis present

## 2021-12-31 DIAGNOSIS — Z87891 Personal history of nicotine dependence: Secondary | ICD-10-CM | POA: Diagnosis not present

## 2021-12-31 DIAGNOSIS — Z8249 Family history of ischemic heart disease and other diseases of the circulatory system: Secondary | ICD-10-CM

## 2021-12-31 DIAGNOSIS — R339 Retention of urine, unspecified: Secondary | ICD-10-CM

## 2021-12-31 DIAGNOSIS — Z7722 Contact with and (suspected) exposure to environmental tobacco smoke (acute) (chronic): Secondary | ICD-10-CM | POA: Diagnosis present

## 2021-12-31 DIAGNOSIS — J45909 Unspecified asthma, uncomplicated: Secondary | ICD-10-CM | POA: Diagnosis not present

## 2021-12-31 DIAGNOSIS — I2510 Atherosclerotic heart disease of native coronary artery without angina pectoris: Secondary | ICD-10-CM | POA: Diagnosis present

## 2021-12-31 DIAGNOSIS — E1151 Type 2 diabetes mellitus with diabetic peripheral angiopathy without gangrene: Secondary | ICD-10-CM | POA: Diagnosis not present

## 2021-12-31 DIAGNOSIS — R195 Other fecal abnormalities: Secondary | ICD-10-CM | POA: Diagnosis not present

## 2021-12-31 DIAGNOSIS — N182 Chronic kidney disease, stage 2 (mild): Secondary | ICD-10-CM | POA: Diagnosis present

## 2021-12-31 DIAGNOSIS — I252 Old myocardial infarction: Secondary | ICD-10-CM | POA: Diagnosis not present

## 2021-12-31 DIAGNOSIS — C8590 Non-Hodgkin lymphoma, unspecified, unspecified site: Secondary | ICD-10-CM | POA: Diagnosis not present

## 2021-12-31 DIAGNOSIS — R159 Full incontinence of feces: Secondary | ICD-10-CM | POA: Diagnosis present

## 2021-12-31 DIAGNOSIS — I10 Essential (primary) hypertension: Secondary | ICD-10-CM | POA: Diagnosis present

## 2021-12-31 DIAGNOSIS — I130 Hypertensive heart and chronic kidney disease with heart failure and stage 1 through stage 4 chronic kidney disease, or unspecified chronic kidney disease: Secondary | ICD-10-CM | POA: Diagnosis present

## 2021-12-31 DIAGNOSIS — E1165 Type 2 diabetes mellitus with hyperglycemia: Secondary | ICD-10-CM | POA: Diagnosis not present

## 2021-12-31 DIAGNOSIS — I5032 Chronic diastolic (congestive) heart failure: Secondary | ICD-10-CM | POA: Diagnosis present

## 2021-12-31 DIAGNOSIS — K219 Gastro-esophageal reflux disease without esophagitis: Secondary | ICD-10-CM | POA: Diagnosis present

## 2021-12-31 DIAGNOSIS — Z955 Presence of coronary angioplasty implant and graft: Secondary | ICD-10-CM

## 2021-12-31 DIAGNOSIS — D128 Benign neoplasm of rectum: Secondary | ICD-10-CM | POA: Diagnosis not present

## 2021-12-31 DIAGNOSIS — Z833 Family history of diabetes mellitus: Secondary | ICD-10-CM

## 2021-12-31 DIAGNOSIS — D509 Iron deficiency anemia, unspecified: Secondary | ICD-10-CM | POA: Diagnosis not present

## 2021-12-31 DIAGNOSIS — R739 Hyperglycemia, unspecified: Secondary | ICD-10-CM

## 2021-12-31 DIAGNOSIS — Z993 Dependence on wheelchair: Secondary | ICD-10-CM

## 2021-12-31 DIAGNOSIS — R338 Other retention of urine: Secondary | ICD-10-CM

## 2021-12-31 DIAGNOSIS — Z79899 Other long term (current) drug therapy: Secondary | ICD-10-CM

## 2021-12-31 DIAGNOSIS — Z888 Allergy status to other drugs, medicaments and biological substances status: Secondary | ICD-10-CM

## 2021-12-31 DIAGNOSIS — E11649 Type 2 diabetes mellitus with hypoglycemia without coma: Secondary | ICD-10-CM

## 2021-12-31 DIAGNOSIS — R0602 Shortness of breath: Secondary | ICD-10-CM | POA: Diagnosis not present

## 2021-12-31 DIAGNOSIS — I350 Nonrheumatic aortic (valve) stenosis: Secondary | ICD-10-CM

## 2021-12-31 DIAGNOSIS — E119 Type 2 diabetes mellitus without complications: Secondary | ICD-10-CM

## 2021-12-31 DIAGNOSIS — I110 Hypertensive heart disease with heart failure: Secondary | ICD-10-CM

## 2021-12-31 DIAGNOSIS — I509 Heart failure, unspecified: Secondary | ICD-10-CM | POA: Diagnosis not present

## 2021-12-31 DIAGNOSIS — D375 Neoplasm of uncertain behavior of rectum: Secondary | ICD-10-CM | POA: Diagnosis not present

## 2021-12-31 HISTORY — PX: PARTIAL PROCTECTOMY BY TEM: SHX6011

## 2021-12-31 LAB — GLUCOSE, CAPILLARY
Glucose-Capillary: 207 mg/dL — ABNORMAL HIGH (ref 70–99)
Glucose-Capillary: 151 mg/dL — ABNORMAL HIGH (ref 70–99)
Glucose-Capillary: 104 mg/dL — ABNORMAL HIGH (ref 70–99)
Glucose-Capillary: 114 mg/dL — ABNORMAL HIGH (ref 70–99)

## 2021-12-31 LAB — HEMOGLOBIN A1C
Hgb A1c MFr Bld: 7.2 % — ABNORMAL HIGH (ref 4.8–5.6)
Mean Plasma Glucose: 159.94 mg/dL

## 2021-12-31 SURGERY — PARTIAL PROCTECTOMY BY TEM
Anesthesia: General

## 2021-12-31 MED ORDER — VITAMIN B-12 1000 MCG PO TABS
500.0000 ug | ORAL_TABLET | Freq: Every day | ORAL | Status: DC
  Administered 2022-01-01 – 2022-01-02 (×2): 500 ug via ORAL
  Filled 2021-12-31 (×2): qty 1

## 2021-12-31 MED ORDER — ACETAMINOPHEN 500 MG PO TABS
1000.0000 mg | ORAL_TABLET | Freq: Four times a day (QID) | ORAL | Status: DC
  Administered 2021-12-31 – 2022-01-01 (×5): 1000 mg via ORAL
  Filled 2021-12-31 (×6): qty 2

## 2021-12-31 MED ORDER — FERROUS SULFATE 325 (65 FE) MG PO TABS
325.0000 mg | ORAL_TABLET | Freq: Two times a day (BID) | ORAL | Status: DC
  Administered 2021-12-31 – 2022-01-02 (×4): 325 mg via ORAL
  Filled 2021-12-31 (×4): qty 1

## 2021-12-31 MED ORDER — ONDANSETRON HCL 4 MG PO TABS: 4.0000 mg | ORAL_TABLET | Freq: Four times a day (QID) | ORAL | Status: DC | PRN

## 2021-12-31 MED ORDER — DIBUCAINE (PERIANAL) 1 % EX OINT
TOPICAL_OINTMENT | CUTANEOUS | Status: AC
  Filled 2021-12-31: qty 28

## 2021-12-31 MED ORDER — PROCHLORPERAZINE MALEATE 10 MG PO TABS
10.0000 mg | ORAL_TABLET | Freq: Four times a day (QID) | ORAL | Status: DC | PRN
  Filled 2021-12-31: qty 1

## 2021-12-31 MED ORDER — BUPIVACAINE-EPINEPHRINE 0.25% -1:200000 IJ SOLN
INTRAMUSCULAR | Status: DC | PRN
  Administered 2021-12-31: 30 mL

## 2021-12-31 MED ORDER — SIMETHICONE 80 MG PO CHEW: 40.0000 mg | CHEWABLE_TABLET | Freq: Four times a day (QID) | ORAL | Status: DC | PRN

## 2021-12-31 MED ORDER — MELATONIN 3 MG PO TABS
3.0000 mg | ORAL_TABLET | Freq: Every evening | ORAL | Status: DC | PRN
  Administered 2022-01-01: 3 mg via ORAL
  Filled 2021-12-31 (×2): qty 1

## 2021-12-31 MED ORDER — INSULIN ASPART 100 UNIT/ML IJ SOLN
INTRAMUSCULAR | Status: AC
  Filled 2021-12-31: qty 1

## 2021-12-31 MED ORDER — CHLORHEXIDINE GLUCONATE CLOTH 2 % EX PADS: 6.0000 | MEDICATED_PAD | Freq: Once | CUTANEOUS | Status: DC

## 2021-12-31 MED ORDER — METOPROLOL TARTRATE 5 MG/5ML IV SOLN
5.0000 mg | Freq: Four times a day (QID) | INTRAVENOUS | Status: DC | PRN
  Administered 2021-12-31: 5 mg via INTRAVENOUS
  Filled 2021-12-31: qty 5

## 2021-12-31 MED ORDER — CARVEDILOL 12.5 MG PO TABS
12.5000 mg | ORAL_TABLET | Freq: Two times a day (BID) | ORAL | Status: DC
  Administered 2022-01-01: 12.5 mg via ORAL
  Filled 2021-12-31: qty 1

## 2021-12-31 MED ORDER — 0.9 % SODIUM CHLORIDE (POUR BTL) OPTIME
TOPICAL | Status: DC | PRN
  Administered 2021-12-31: 1000 mL

## 2021-12-31 MED ORDER — ENOXAPARIN SODIUM 40 MG/0.4ML IJ SOSY
40.0000 mg | PREFILLED_SYRINGE | INTRAMUSCULAR | Status: DC
  Administered 2022-01-01: 40 mg via SUBCUTANEOUS
  Filled 2021-12-31: qty 0.4

## 2021-12-31 MED ORDER — METRONIDAZOLE 500 MG PO TABS: 1000.0000 mg | ORAL_TABLET | ORAL | Status: DC

## 2021-12-31 MED ORDER — PHENYLEPHRINE 80 MCG/ML (10ML) SYRINGE FOR IV PUSH (FOR BLOOD PRESSURE SUPPORT)
PREFILLED_SYRINGE | INTRAVENOUS | Status: DC | PRN
  Administered 2021-12-31 (×3): 80 ug via INTRAVENOUS

## 2021-12-31 MED ORDER — BUPIVACAINE-EPINEPHRINE (PF) 0.25% -1:200000 IJ SOLN
INTRAMUSCULAR | Status: AC
  Filled 2021-12-31: qty 30

## 2021-12-31 MED ORDER — MAGIC MOUTHWASH
15.0000 mL | Freq: Four times a day (QID) | ORAL | Status: DC | PRN
  Filled 2021-12-31: qty 15

## 2021-12-31 MED ORDER — FENTANYL CITRATE PF 50 MCG/ML IJ SOSY: 25.0000 ug | PREFILLED_SYRINGE | INTRAMUSCULAR | Status: DC | PRN

## 2021-12-31 MED ORDER — ACETAMINOPHEN 500 MG PO TABS
1000.0000 mg | ORAL_TABLET | ORAL | Status: AC
  Administered 2021-12-31: 1000 mg via ORAL
  Filled 2021-12-31: qty 2

## 2021-12-31 MED ORDER — PROPOFOL 10 MG/ML IV BOLUS
INTRAVENOUS | Status: AC
  Filled 2021-12-31: qty 20

## 2021-12-31 MED ORDER — LACTATED RINGERS IV SOLN: INTRAVENOUS | Status: DC

## 2021-12-31 MED ORDER — VITAMIN C 500 MG PO TABS
500.0000 mg | ORAL_TABLET | Freq: Every day | ORAL | Status: DC
  Administered 2021-12-31 – 2022-01-01 (×2): 500 mg via ORAL
  Filled 2021-12-31 (×2): qty 1

## 2021-12-31 MED ORDER — DIBUCAINE (PERIANAL) 1 % EX OINT
TOPICAL_OINTMENT | CUTANEOUS | Status: DC | PRN
  Administered 2021-12-31: 1 via RECTAL

## 2021-12-31 MED ORDER — BUPIVACAINE LIPOSOME 1.3 % IJ SUSP
INTRAMUSCULAR | Status: DC | PRN
  Administered 2021-12-31: 20 mL

## 2021-12-31 MED ORDER — EPHEDRINE SULFATE-NACL 50-0.9 MG/10ML-% IV SOSY
PREFILLED_SYRINGE | INTRAVENOUS | Status: DC | PRN
  Administered 2021-12-31: 10 mg via INTRAVENOUS
  Administered 2021-12-31 (×2): 5 mg via INTRAVENOUS

## 2021-12-31 MED ORDER — ONDANSETRON HCL 4 MG/2ML IJ SOLN
INTRAMUSCULAR | Status: DC | PRN
  Administered 2021-12-31: 4 mg via INTRAVENOUS

## 2021-12-31 MED ORDER — INSULIN ASPART 100 UNIT/ML IJ SOLN
2.0000 [IU] | Freq: Once | INTRAMUSCULAR | Status: AC
Start: 2021-12-31 — End: 2021-12-31
  Administered 2021-12-31: 2 [IU] via SUBCUTANEOUS

## 2021-12-31 MED ORDER — DIPHENHYDRAMINE HCL 12.5 MG/5ML PO ELIX: 12.5000 mg | ORAL_SOLUTION | Freq: Four times a day (QID) | ORAL | Status: DC | PRN

## 2021-12-31 MED ORDER — WITCH HAZEL-GLYCERIN EX PADS
MEDICATED_PAD | CUTANEOUS | Status: DC | PRN
  Filled 2021-12-31: qty 100

## 2021-12-31 MED ORDER — ENSURE PRE-SURGERY PO LIQD
592.0000 mL | Freq: Once | ORAL | Status: DC
  Filled 2021-12-31: qty 592

## 2021-12-31 MED ORDER — ONDANSETRON HCL 4 MG/2ML IJ SOLN: 4.0000 mg | Freq: Four times a day (QID) | INTRAMUSCULAR | Status: DC | PRN

## 2021-12-31 MED ORDER — LACTATED RINGERS IR SOLN
Status: DC | PRN
  Administered 2021-12-31: 1000 mL

## 2021-12-31 MED ORDER — ROCURONIUM BROMIDE 10 MG/ML (PF) SYRINGE
PREFILLED_SYRINGE | INTRAVENOUS | Status: DC | PRN
  Administered 2021-12-31: 10 mg via INTRAVENOUS
  Administered 2021-12-31: 50 mg via INTRAVENOUS

## 2021-12-31 MED ORDER — SODIUM CHLORIDE 0.9 % IV SOLN
2.0000 g | Freq: Two times a day (BID) | INTRAVENOUS | Status: AC
  Administered 2022-01-01: 2 g via INTRAVENOUS
  Filled 2021-12-31: qty 2

## 2021-12-31 MED ORDER — DEXAMETHASONE SODIUM PHOSPHATE 10 MG/ML IJ SOLN
INTRAMUSCULAR | Status: DC | PRN
  Administered 2021-12-31: 10 mg via INTRAVENOUS

## 2021-12-31 MED ORDER — SODIUM CHLORIDE 0.9% FLUSH: 3.0000 mL | INTRAVENOUS | Status: DC | PRN

## 2021-12-31 MED ORDER — ROSUVASTATIN CALCIUM 5 MG PO TABS
5.0000 mg | ORAL_TABLET | ORAL | Status: DC
  Administered 2021-12-31: 5 mg via ORAL
  Filled 2021-12-31 (×2): qty 1

## 2021-12-31 MED ORDER — ASPIRIN 81 MG PO TBEC
81.0000 mg | DELAYED_RELEASE_TABLET | Freq: Every day | ORAL | Status: DC
  Administered 2022-01-01 – 2022-01-02 (×2): 81 mg via ORAL
  Filled 2021-12-31 (×2): qty 1

## 2021-12-31 MED ORDER — PHENYLEPHRINE HCL-NACL 20-0.9 MG/250ML-% IV SOLN
INTRAVENOUS | Status: DC | PRN
  Administered 2021-12-31: 25 ug/min via INTRAVENOUS

## 2021-12-31 MED ORDER — ALVIMOPAN 12 MG PO CAPS
12.0000 mg | ORAL_CAPSULE | Freq: Two times a day (BID) | ORAL | Status: DC
  Administered 2022-01-01: 12 mg via ORAL
  Filled 2021-12-31 (×2): qty 1

## 2021-12-31 MED ORDER — NEOMYCIN SULFATE 500 MG PO TABS: 1000.0000 mg | ORAL_TABLET | ORAL | Status: DC

## 2021-12-31 MED ORDER — SUGAMMADEX SODIUM 200 MG/2ML IV SOLN
INTRAVENOUS | Status: DC | PRN
  Administered 2021-12-31: 200 mg via INTRAVENOUS

## 2021-12-31 MED ORDER — TRAMADOL HCL 50 MG PO TABS
50.0000 mg | ORAL_TABLET | Freq: Four times a day (QID) | ORAL | Status: DC | PRN
  Administered 2022-01-01: 100 mg via ORAL
  Administered 2022-01-01: 50 mg via ORAL
  Administered 2022-01-02 (×2): 100 mg via ORAL
  Filled 2021-12-31: qty 2
  Filled 2021-12-31: qty 1
  Filled 2021-12-31 (×2): qty 2
  Filled 2021-12-31: qty 1

## 2021-12-31 MED ORDER — FENTANYL CITRATE (PF) 100 MCG/2ML IJ SOLN
INTRAMUSCULAR | Status: AC
  Filled 2021-12-31: qty 2

## 2021-12-31 MED ORDER — HYDROMORPHONE HCL 1 MG/ML IJ SOLN
0.5000 mg | INTRAMUSCULAR | Status: DC | PRN
  Administered 2022-01-01: 0.5 mg via INTRAVENOUS
  Filled 2021-12-31: qty 1

## 2021-12-31 MED ORDER — BISACODYL 5 MG PO TBEC: 20.0000 mg | DELAYED_RELEASE_TABLET | Freq: Once | ORAL | Status: DC

## 2021-12-31 MED ORDER — ORAL CARE MOUTH RINSE: 15.0000 mL | Freq: Once | OROMUCOSAL | Status: AC

## 2021-12-31 MED ORDER — PROPOFOL 10 MG/ML IV BOLUS
INTRAVENOUS | Status: DC | PRN
  Administered 2021-12-31: 80 mg via INTRAVENOUS

## 2021-12-31 MED ORDER — POLYETHYLENE GLYCOL 3350 17 GM/SCOOP PO POWD: 1.0000 | Freq: Once | ORAL | Status: DC

## 2021-12-31 MED ORDER — TIZANIDINE HCL 4 MG PO TABS
2.0000 mg | ORAL_TABLET | Freq: Two times a day (BID) | ORAL | Status: DC
  Administered 2021-12-31 – 2022-01-02 (×4): 2 mg via ORAL
  Filled 2021-12-31 (×4): qty 1

## 2021-12-31 MED ORDER — ALUM & MAG HYDROXIDE-SIMETH 200-200-20 MG/5ML PO SUSP: 30.0000 mL | Freq: Four times a day (QID) | ORAL | Status: DC | PRN

## 2021-12-31 MED ORDER — BUPIVACAINE LIPOSOME 1.3 % IJ SUSP: 20.0000 mL | Freq: Once | INTRAMUSCULAR | Status: DC

## 2021-12-31 MED ORDER — ENSURE PRE-SURGERY PO LIQD
296.0000 mL | Freq: Once | ORAL | Status: DC
  Filled 2021-12-31: qty 296

## 2021-12-31 MED ORDER — SODIUM CHLORIDE 0.9% FLUSH
3.0000 mL | Freq: Two times a day (BID) | INTRAVENOUS | Status: DC
  Administered 2021-12-31 – 2022-01-01 (×2): 3 mL via INTRAVENOUS

## 2021-12-31 MED ORDER — HYDROCORT-PRAMOXINE (PERIANAL) 2.5-1 % EX CREA
TOPICAL_CREAM | Freq: Four times a day (QID) | CUTANEOUS | Status: DC | PRN
  Filled 2021-12-31: qty 30

## 2021-12-31 MED ORDER — SODIUM CHLORIDE 0.9 % IV SOLN
250.0000 mL | INTRAVENOUS | Status: DC | PRN
  Administered 2022-01-01: 250 mL via INTRAVENOUS

## 2021-12-31 MED ORDER — PIOGLITAZONE HCL 30 MG PO TABS
45.0000 mg | ORAL_TABLET | Freq: Every day | ORAL | Status: DC
  Administered 2022-01-01 – 2022-01-02 (×2): 45 mg via ORAL
  Filled 2021-12-31 (×2): qty 1

## 2021-12-31 MED ORDER — CHLORHEXIDINE GLUCONATE 0.12 % MT SOLN
15.0000 mL | Freq: Once | OROMUCOSAL | Status: AC
  Administered 2021-12-31: 15 mL via OROMUCOSAL

## 2021-12-31 MED ORDER — NITROGLYCERIN 0.4 MG SL SUBL
0.4000 mg | SUBLINGUAL_TABLET | SUBLINGUAL | Status: DC | PRN
Start: 2021-12-31 — End: 2022-01-02

## 2021-12-31 MED ORDER — OXYCODONE HCL 5 MG PO TABS: 2.5000 mg | ORAL_TABLET | Freq: Four times a day (QID) | ORAL | 0 refills | Status: DC | PRN

## 2021-12-31 MED ORDER — LIP MEDEX EX OINT
TOPICAL_OINTMENT | Freq: Two times a day (BID) | CUTANEOUS | Status: DC
  Administered 2021-12-31 – 2022-01-01 (×2): 1 via TOPICAL
  Filled 2021-12-31 (×2): qty 7

## 2021-12-31 MED ORDER — PROCHLORPERAZINE EDISYLATE 10 MG/2ML IJ SOLN: 5.0000 mg | Freq: Four times a day (QID) | INTRAMUSCULAR | Status: DC | PRN

## 2021-12-31 MED ORDER — ENOXAPARIN SODIUM 40 MG/0.4ML IJ SOSY
40.0000 mg | PREFILLED_SYRINGE | Freq: Once | INTRAMUSCULAR | Status: AC
  Administered 2021-12-31: 40 mg via SUBCUTANEOUS
  Filled 2021-12-31: qty 0.4

## 2021-12-31 MED ORDER — GABAPENTIN 300 MG PO CAPS
300.0000 mg | ORAL_CAPSULE | ORAL | Status: AC
  Administered 2021-12-31: 300 mg via ORAL
  Filled 2021-12-31: qty 1

## 2021-12-31 MED ORDER — LORAZEPAM 1 MG PO TABS
1.0000 mg | ORAL_TABLET | Freq: Every day | ORAL | Status: DC
  Administered 2021-12-31 – 2022-01-01 (×2): 1 mg via ORAL
  Filled 2021-12-31 (×2): qty 1

## 2021-12-31 MED ORDER — ENSURE SURGERY PO LIQD
237.0000 mL | Freq: Two times a day (BID) | ORAL | Status: DC
  Administered 2022-01-01: 237 mL via ORAL

## 2021-12-31 MED ORDER — ONDANSETRON HCL 4 MG/2ML IJ SOLN
4.0000 mg | Freq: Once | INTRAMUSCULAR | Status: DC | PRN
Start: 2021-12-31 — End: 2021-12-31

## 2021-12-31 MED ORDER — DIPHENHYDRAMINE HCL 50 MG/ML IJ SOLN: 12.5000 mg | Freq: Four times a day (QID) | INTRAMUSCULAR | Status: DC | PRN

## 2021-12-31 MED ORDER — FENTANYL CITRATE (PF) 100 MCG/2ML IJ SOLN
INTRAMUSCULAR | Status: DC | PRN
Start: 2021-12-31 — End: 2021-12-31
  Administered 2021-12-31 (×2): 50 ug via INTRAVENOUS

## 2021-12-31 MED ORDER — SODIUM CHLORIDE 0.9 % IV SOLN
2.0000 g | INTRAVENOUS | Status: AC
  Administered 2021-12-31: 2 g via INTRAVENOUS
  Filled 2021-12-31: qty 2

## 2021-12-31 MED ORDER — ALVIMOPAN 12 MG PO CAPS
12.0000 mg | ORAL_CAPSULE | ORAL | Status: AC
  Administered 2021-12-31: 12 mg via ORAL
  Filled 2021-12-31: qty 1

## 2021-12-31 MED ORDER — BUPIVACAINE LIPOSOME 1.3 % IJ SUSP
INTRAMUSCULAR | Status: AC
  Filled 2021-12-31: qty 20

## 2021-12-31 MED ORDER — HYDRALAZINE HCL 20 MG/ML IJ SOLN
10.0000 mg | INTRAMUSCULAR | Status: DC | PRN
  Filled 2021-12-31: qty 1

## 2021-12-31 SURGICAL SUPPLY — 55 items
ABDOMINAL PAD ABD IMPLANT
BAG COUNTER SPONGE SURGICOUNT (BAG) IMPLANT
BAG SPNG CNTER NS LX DISP (BAG)
BLADE SURG 15 STRL LF DISP TIS (BLADE) IMPLANT
BLADE SURG 15 STRL SS (BLADE) ×1
BRIEF MESH DISP LRG (UNDERPADS AND DIAPERS) IMPLANT
CABLE HIGH FREQUENCY MONO STRZ (ELECTRODE) ×1 IMPLANT
DRAPE LAPAROTOMY T 102X78X121 (DRAPES) IMPLANT
DRAPE SURG IRRIG POUCH 19X23 (DRAPES) ×1 IMPLANT
DRAPE WARM FLUID 44X44 (DRAPES) ×1 IMPLANT
ELECT PENCIL ROCKER SW 15FT (MISCELLANEOUS) IMPLANT
ELECT REM PT RETURN 15FT ADLT (MISCELLANEOUS) ×1 IMPLANT
GAUZE 4X4 16PLY ~~LOC~~+RFID DBL (SPONGE) ×1 IMPLANT
GAUZE PAD ABD 8X10 STRL (GAUZE/BANDAGES/DRESSINGS) IMPLANT
GAUZE SPONGE 4X4 12PLY STRL (GAUZE/BANDAGES/DRESSINGS) IMPLANT
GLOVE ECLIPSE 8.0 STRL XLNG CF (GLOVE) ×2 IMPLANT
GLOVE INDICATOR 8.0 STRL GRN (GLOVE) ×2 IMPLANT
GOWN STRL REUS W/ TWL XL LVL3 (GOWN DISPOSABLE) ×4 IMPLANT
GOWN STRL REUS W/TWL XL LVL3 (GOWN DISPOSABLE) ×4
IRRIG SUCT STRYKERFLOW 2 WTIP (MISCELLANEOUS) ×1
IRRIGATION SUCT STRKRFLW 2 WTP (MISCELLANEOUS) ×1 IMPLANT
KIT BASIN OR (CUSTOM PROCEDURE TRAY) ×1 IMPLANT
KIT TURNOVER KIT A (KITS) IMPLANT
LEGGING LITHOTOMY PAIR STRL (DRAPES) ×1 IMPLANT
NEEDLE HYPO 22GX1.5 SAFETY (NEEDLE) ×1 IMPLANT
PACK BASIC VI WITH GOWN DISP (CUSTOM PROCEDURE TRAY) ×1 IMPLANT
PAD POSITIONING PINK XL (MISCELLANEOUS) ×1 IMPLANT
PENCIL SMOKE EVACUATOR (MISCELLANEOUS) IMPLANT
RETRACTOR LONE STAR DISPOSABLE (INSTRUMENTS) IMPLANT
RETRACTOR STAY HOOK 5MM (MISCELLANEOUS) IMPLANT
SCISSORS LAP 5X35 DISP (ENDOMECHANICALS) ×1 IMPLANT
SET TUBE SMOKE EVAC HIGH FLOW (TUBING) ×1 IMPLANT
SHEARS HARMONIC ACE PLUS 36CM (ENDOMECHANICALS) ×1 IMPLANT
STOPCOCK 4 WAY LG BORE MALE ST (IV SETS) IMPLANT
SURGILUBE 2OZ TUBE FLIPTOP (MISCELLANEOUS) ×1 IMPLANT
SUT CHROMIC 3 0 SH 27 (SUTURE) IMPLANT
SUT PDS AB 2-0 CT2 27 (SUTURE) IMPLANT
SUT PDS AB 3-0 SH 27 (SUTURE) IMPLANT
SUT SILK 2 0 (SUTURE)
SUT SILK 2 0 SH CR/8 (SUTURE) IMPLANT
SUT SILK 2-0 18XBRD TIE 12 (SUTURE) IMPLANT
SUT SILK 3 0 SH 30 (SUTURE) IMPLANT
SUT SILK 3 0 SH CR/8 (SUTURE) IMPLANT
SUT V-LOC BARB 180 2/0GR6 GS22 (SUTURE) ×2
SUT VIC AB 2-0 UR6 27 (SUTURE) IMPLANT
SUT VIC AB 3-0 SH 27 (SUTURE)
SUT VIC AB 3-0 SH 27XBRD (SUTURE) IMPLANT
SUTURE V-LC BRB 180 2/0GR6GS22 (SUTURE) IMPLANT
SYR 20ML LL LF (SYRINGE) ×1 IMPLANT
SYR BULB IRRIG 60ML STRL (SYRINGE) IMPLANT
TOWEL OR 17X26 10 PK STRL BLUE (TOWEL DISPOSABLE) ×1 IMPLANT
TOWEL OR NON WOVEN STRL DISP B (DISPOSABLE) ×1 IMPLANT
TRAY FOLEY MTR SLVR 16FR STAT (SET/KITS/TRAYS/PACK) ×1 IMPLANT
TUBING CONNECTING 10 (TUBING) IMPLANT
YANKAUER SUCT BULB TIP 10FT TU (MISCELLANEOUS) IMPLANT

## 2021-12-31 NOTE — Interval H&P Note (Signed)
History and Physical Interval Note:  12/31/2021 3:24 PM  Bradley Sheppard  has presented today for surgery, with the diagnosis of RECTAL POLYP.  The various methods of treatment have been discussed with the patient and family. After consideration of risks, benefits and other options for treatment, the patient has consented to  Procedure(s): TEM PARTIAL PROTECTOMY (N/A) as a surgical intervention.  The patient's history has been reviewed, patient examined, no change in status, stable for surgery.  I have reviewed the patient's chart and labs.  Questions were answered to the patient's satisfaction.    I have re-reviewed the the patient's records, history, medications, and allergies.  I have re-examined the patient.  I again discussed intraoperative plans and goals of post-operative recovery.  The patient agrees to proceed.  Bradley Sheppard  03-08-1941 [id number]  Patient Care Team: [name], MD as PCP - General (Internal Medicine) Satira Sark, MD as PCP - Cardiology (Cardiology) Derek Jack, MD as Medical Oncologist (Hematology) Daneil Dolin, MD as Consulting Physician (Gastroenterology) Michael Boston, MD as Consulting Physician (General Surgery)  Patient Active Problem List   Diagnosis Date Noted   Pressure injury of skin 11/29/2021   Chronic kidney disease, stage 3b ([place]) 11/26/2021   Acute on chronic congestive heart failure ([place])    SOB (shortness of breath) 11/23/2021   Acute heart failure with preserved ejection fraction (HFpEF) ([place]) 11/23/2021    Class: Acute   DMII (diabetes mellitus, type 2) ([place]) 11/23/2021   Rectal mass 11/23/2021    Class: Chronic   Bilateral pleural effusion 11/23/2021    Class: Acute   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   Iron deficiency anemia 07/28/2021   CKD (chronic kidney disease), stage II 07/11/2021   Mixed hyperlipidemia 04/03/2009   Essential hypertension, benign 04/03/2009   CORONARY ATHEROSCLEROSIS NATIVE CORONARY  ARTERY 04/03/2009   Aortic stenosis 02/18/2009    Past Medical History:  Diagnosis Date   (HFpEF) heart failure with preserved ejection fraction (HCC)    Anxiety    Aortic stenosis    Arthritis    Asthma    Chronic pain    CKD (chronic kidney disease) stage 3, GFR 30-59 ml/min (HCC)    Coronary atherosclerosis of native coronary artery    Multvessel, DES to LAD and RCA 8/03; EF 65% by echo 11/2014   DM2 (diabetes mellitus, type 2) ([place])    Essential hypertension    Gastric ulcer    Gastritis    Related to NSAIDs   GERD (gastroesophageal reflux disease)    Iron deficiency anemia    Mitral valve regurgitation    Mixed hyperlipidemia    Myocardial infarction [facility])    Nephrolithiasis     Past Surgical History:  Procedure Laterality Date   APPENDECTOMY     BIOPSY  09/25/2021   Procedure: BIOPSY;  Surgeon: Daneil Dolin, MD;  [facility];  Service: Endoscopy;;   CARDIAC CATHETERIZATION  01/2002   90% obstruction in proximal LAD, 60 % obstruction in proximal circumflex and 70%  in proximal RCA. LAD & RCA stented  stents x 2   COLONOSCOPY WITH PROPOFOL N/A 09/25/2021   Procedure: COLONOSCOPY WITH PROPOFOL;  Surgeon: Daneil Dolin, MD;  [facility];  Service: Endoscopy;  Laterality: N/A;  11:00am   L knee replacement  2009   Subsequent infectino requiring resectino arthroplasty with incision and drainage 8/10, and reimplantizathion arthroplasty, 9/20. 5 total surgeries.    Social History   Socioeconomic  History   Marital status: Married    Spouse name: Not on file   Number of children: Not on file   Years of education: Not on file   Highest education level: Not on file  Occupational History   Not on file  Tobacco Use   Smoking status: Former    Types: Cigarettes    Passive exposure: Never   Smokeless tobacco: Current    Types: Chew  Vaping Use   Vaping Use: Never used  Substance and Sexual Activity   Alcohol use: No   Drug use: No    Sexual activity: Not Currently  Other Topics Concern   Not on file  Social History Narrative   Full time- [employer]         Social Determinants of Health   Financial Resource Strain: Not on file  Food Insecurity: Not on file  Transportation Needs: Not on file  Physical Activity: Not on file  Stress: Not on file  Social Connections: Not on file  Intimate Partner Violence: Not on file    Family History  Problem Relation Age of Onset   Diabetes Mother    Aneurysm Mother        Brain   Stroke Father    Stroke Other    Colon cancer Neg Hx     Medications Prior to Admission  Medication Sig Dispense Refill Last Dose   aspirin EC 81 MG tablet Take 81 mg by mouth daily. Swallow whole.   12/30/2021   carvedilol (COREG) 12.5 MG tablet Take 1 tablet (12.5 mg total) by mouth 2 (two) times daily with a meal. 60 tablet 1 12/31/2021 at 0800   ferrous sulfate 324 MG TBEC Take 324 mg by mouth 2 (two) times daily.   Past Week   furosemide (LASIX) 40 MG tablet Take 1 tablet (40 mg total) by mouth daily. 30 tablet 1 12/30/2021   glipiZIDE (GLUCOTROL XL) 10 MG 24 hr tablet Take 10 mg by mouth 2 (two) times daily.   12/30/2021   LORazepam (ATIVAN) 1 MG tablet Take 1 mg by mouth at bedtime.   12/30/2021   metFORMIN (GLUCOPHAGE) 500 MG tablet Take 500-1,000 mg by mouth See admin instructions. Take 1000 mg in the morning and 500 mg at night   12/30/2021   nitroGLYCERIN (NITROSTAT) 0.4 MG SL tablet Place 1 tablet (0.4 mg total) under the tongue every 5 (five) minutes as needed. 25 tablet 3 never   pioglitazone (ACTOS) 45 MG tablet Take 45 mg by mouth daily.   12/30/2021   rosuvastatin (CRESTOR) 5 MG tablet Take 5 mg by mouth every Wednesday.   Past Week   tiZANidine (ZANAFLEX) 2 MG tablet Take 2 mg by mouth 2 (two) times daily.   12/30/2021   vitamin B-12 (CYANOCOBALAMIN) 500 MCG tablet Take 500 mcg by mouth daily.   Past Week   vitamin C (ASCORBIC ACID) 500 MG tablet Take 500 mg by mouth at bedtime.   Past  Week   acetaminophen (TYLENOL) 500 MG tablet Take 1,000 mg by mouth every 6 (six) hours as needed for moderate pain.   More than a month    Current Facility-Administered Medications  Medication Dose Route Frequency Provider Last Rate Last Admin   bupivacaine liposome (EXPAREL) 1.3 % injection 266 mg  20 mL Infiltration Once Michael Boston, MD       cefoTEtan (CEFOTAN) 2 g in sodium chloride 0.9 % 100 mL IVPB  2 g Intravenous On Call to OR Mariko Nowakowski,  Remo Lipps, MD       Chlorhexidine Gluconate Cloth 2 % PADS 6 each  6 each Topical Once Ousmane Seeman, Remo Lipps, MD       feeding supplement (ENSURE PRE-SURGERY) liquid 296 mL  296 mL Oral Once Michael Boston, MD       feeding supplement (ENSURE PRE-SURGERY) liquid 592 mL  592 mL Oral Once Michael Boston, MD       lactated ringers infusion   Intravenous Continuous Brennan Bailey, MD 10 mL/hr at 12/31/21 1036 New Bag at 12/31/21 1036     Allergies  Allergen Reactions   Prednisone Other (See Comments)    "makes him feel faint", hyperglycemic     BP (!) 161/70 (BP Location: Right Arm)   Pulse (!) 59   Temp 97.7 F (36.5 C) (Oral)   Resp 15   Ht 6' (1.829 m)   Wt 91.4 kg   SpO2 100%   BMI 27.33 kg/m   Labs: Results for orders placed or performed during the hospital encounter of 12/31/21 (from the past 48 hour(s))  Glucose, capillary     Status: Abnormal   Collection Time: 12/31/21  9:46 AM  Result Value Ref Range   Glucose-Capillary 207 (H) 70 - 99 mg/dL    Comment: Glucose reference range applies only to samples taken after fasting for at least 8 hours.  Hemoglobin A1c     Status: Abnormal   Collection Time: 12/31/21 10:36 AM  Result Value Ref Range   Hgb A1c MFr Bld 7.2 (H) 4.8 - 5.6 %    Comment: (NOTE) Pre diabetes:          5.7%-6.4%  Diabetes:              >6.4%  Glycemic control for   <7.0% adults with diabetes    Mean Plasma Glucose 159.94 mg/dL    Comment: Performed at [facility] [address] [place], [place] [address]   Glucose, capillary     Status: Abnormal   Collection Time: 12/31/21 12:17 PM  Result Value Ref Range   Glucose-Capillary 151 (H) 70 - 99 mg/dL    Comment: Glucose reference range applies only to samples taken after fasting for at least 8 hours.  Glucose, capillary     Status: Abnormal   Collection Time: 12/31/21  2:45 PM  Result Value Ref Range   Glucose-Capillary 104 (H) 70 - 99 mg/dL    Comment: Glucose reference range applies only to samples taken after fasting for at least 8 hours.    Imaging / Studies: No results found.   Adin Hector, M.D., F.A.C.S. Gastrointestinal and Minimally Invasive Surgery [facility] [address], [place], [place] [address] [phone] Main / Paging  12/31/2021 3:24 PM    Adin Hector

## 2021-12-31 NOTE — Anesthesia Procedure Notes (Signed)
Procedure Name: Intubation Date/Time: 12/31/2021 4:23 PM  Performed by: Gerald Leitz, CRNAPre-anesthesia Checklist: Patient identified, Patient being monitored, Timeout performed, Emergency Drugs available and Suction available Patient Re-evaluated:Patient Re-evaluated prior to induction Oxygen Delivery Method: Circle system utilized Preoxygenation: Pre-oxygenation with 100% oxygen Induction Type: IV induction Ventilation: Mask ventilation without difficulty Laryngoscope Size: Mac and 3 Grade View: Grade I Tube type: Oral Tube size: 7.5 mm Number of attempts: 1 Airway Equipment and Method: Stylet Placement Confirmation: ETT inserted through vocal cords under direct vision, positive ETCO2 and breath sounds checked- equal and bilateral Secured at: 23 cm Tube secured with: Tape Dental Injury: Teeth and Oropharynx as per pre-operative assessment

## 2021-12-31 NOTE — Discharge Instructions (Addendum)
##############################################  ANORECTAL SURGERY:  POST OPERATIVE INSTRUCTIONS  ######################################################################  EAT Start with a pureed / full liquid diet After 24 hours, gradually transition to a high fiber diet.    CONTROL PAIN Control pain so you can tolerate bowel movements,  walk, sleep, tolerate sneezing/coughing, and go up/down stairs.   HAVE A BOWEL MOVEMENT DAILY Keep your bowels regular to avoid problems.   Taking a fiber supplement every day to keep bowels soft.   Try a laxative to override constipation. Use an antidairrheal to slow down diarrhea.   Call if not better after 2 tries  WALK Walk an hour a day.  Control your pain to do that.   CALL IF YOU HAVE PROBLEMS/CONCERNS Call if you are still struggling despite following these instructions. Call if you have concerns not answered by these instructions  ######################################################################    Take your usually prescribed home medications unless otherwise directed.  DIET: Follow a light bland diet & liquids the first 24 hours after arrival home, such as soup, liquids, starches, etc.  Be sure to drink plenty of fluids.  Quickly advance to a usual solid diet within a few days.  Avoid fast food or heavy meals as your are more likely to get nauseated or have irregular bowels.  A low-fat, high-fiber diet for the rest of your life is ideal.  PAIN CONTROL: Expect swelling and discomfort in the anus/rectal area. Pain is best controlled by a usual combination of many methods TOGETHER: Warm baths/soaks or Ice packs Over the counter pain medication Prescription pain medications Topical creams    Warm water baths or ice packs (30-60 minutes up to 8 times a day, especially after bowel meovements) will help. Use ice for the first few days to help decrease swelling and bruising, then switch to heat such as warm towels, sitz baths, warm  baths, warm showers, etc to help relax tight/sore spots and speed recovery.  Some people prefer to use ice alone, heat alone, alternating between ice & heat.  Experiment to what works for you.    It is helpful to take an over-the-counter pain medication continuously for the first few weeks.  Choose one of the following that works best for you: Naproxen (Aleve, etc)  Two 252m tabs twice a day Ibuprofen (Advil, etc) Three 2070mtabs four times a day (every meal & bedtime) Acetaminophen (Tylenol, etc) 500-65044mour times a day (every meal & bedtime)  A  prescription for pain medication (such as oxycodone, hydrocodone, etc) should be given to you upon discharge.  Take your pain medication as prescribed.  If you are having problems/concerns with the prescription medicine (does not control pain, nausea, vomiting, rash, itching, etc), please call us [phone] see if we need to switch you to a different pain medicine that will work better for you and/or control your side effect better. If you need a refill on your pain medication, please contact your pharmacy.  They will contact our office to request authorization. Prescriptions will not be filled after 5 pm or on week-ends.  If can take up to 48 hours for it to be filled & ready so avoid waiting until you are down to thel ast pill.  A topical cream (Dibucaine) or a prescription for a cream (such as diltiazem 2% gel) may be given to you.  Many people find relief with topical creams.  Some people find it burns too much.  Experiment.  If it helps, use it.  If it burns, don't  using it.  You also may receive a prescription for diazepam, a muscle relaxant to help you to be able to urinate and defecate more easily.  It is safe to take a few doses with the other medications as long as you are not planning to drive or do anything intense.  Hopefully this can minimize the chance of needing a Foley catheter into your bladder     KEEP YOUR BOWELS  REGULAR The goal is one soft bowel movement a day Avoid getting constipated.  Between the surgery and the pain medications, it is common to experience some constipation.  Increasing fluid intake and taking a fiber supplement (such as Metamucil, Citrucel, FiberCon, MiraLax, etc) 2-4 times a day regularly will usually help prevent this problem from occurring.  A mild laxative (prune juice, Milk of Magnesia, MiraLax, etc) should be taken according to package directions if there are no bowel movements after 48 hours. Watch out for diarrhea.  If you have many loose bowel movements, simplify your diet to bland foods & liquids for a few days.  Stop any stool softeners and decrease your fiber supplement.  Switching to mild anti-diarrheal medications (Kayopectate, Pepto Bismol) can help.  Can try an imodium/loperamide dose.  If this worsens or does not improve, please call us.  Wound Care   a. You have some fluffed gauze on top of the anus to help catch drainage and bleeding.  Let the gauze fall off with the first bowel movement or shower.  It is okay to reinforce or replace as needed.  Bleeding is common at first and occasionally tapers off   b. Place soft cotton balls on the anus/wounds and use an absorbent pad in your underwear as needed to catch any drainage and help keep the area.  Try to use cotton balls or pads over regular gauze or toilet paper as gauze will stick and pull, causing pain.  Cotton will come off more easily.   c. Keep the area clean and dry.  Bathe / shower every day.  Keep the area clean by showering / bathing over the incision / wound.   It is okay to soak an open wound to help wash it.  Consider using a squeeze bottle filled with warm water to gently wash the anal area.  Wet wipes or showers / gentle washing after bowel movements is often less traumatic than regular toilet paper.  Use a Sitz Bath 4-8 times a day for relief  A sitz bath is a warm water bath taken in the sitting position  that covers only the hips and buttocks. It may be used for either healing or hygiene purposes. Sitz baths are also used to relieve pain, itching, or muscle spasms.  Gently cleaned the area and the heat will help lower spasm and offer better pain control.    Fill the bathtub half full with warm water. Sit in the water and open the drain a little. Turn on the warm water to keep the tub half full. Keep the water running constantly. Soak in the water for 15 to 20 minutes. After the sitz bath, pat the affected area dry first.   d. You will often notice bleeding, especially with bowel movements.  This should slow down by the end of the first week of surgery.  Sitting on an ice pack can help.   e. Expect some drainage.  You often will have some blood or yellow drainage with open wounds.  Sometimes she will get a little  leaking of liquid stool until the incision/wounds have fully close down.  This should slow down by the end of the first week of surgery, but you will have occasional bleeding or drainage up to a few months after surgery.  Wear an absorbent pad or soft cotton gauze in your underwear until the drainage stops.  ACTIVITIES as tolerated:    You may resume regular (light) daily activities beginning the next day--such as daily self-care, walking, climbing stairs--gradually increasing activities as tolerated.  If you can walk 30 minutes without difficulty, it is safe to try more intense activity such as jogging, treadmill, bicycling, low-impact aerobics, swimming, etc. Save the most intensive and strenuous activity for last such as sit-ups, heavy lifting, contact sports, etc  Refrain from any heavy lifting or straining until you are off narcotics for pain control.   DO NOT PUSH THROUGH PAIN.  Let pain be your guide: If it hurts to do something, don't do it.  Pain is your body warning you to avoid that activity for another week until the pain goes down. You may drive when you are no longer taking  prescription pain medication, you can comfortably sit for long periods of time, and you can safely maneuver your car and apply brakes. You may have sexual intercourse when it is comfortable.   FOLLOW UP in our office Please call [facility] at [phone] to set up an appointment to see your surgeon in the office for a follow-up appointment approximately 3 weeks after your surgery. Make sure that you call for this appointment the day you arrive home to ensure a convenient appointment time.  8. IF YOU HAVE DISABILITY OR FAMILY LEAVE FORMS, BRING THEM TO THE OFFICE FOR PROCESSING.  DO NOT GIVE THEM TO YOUR DOCTOR.        WHEN TO CALL us [phone]: Poor pain control Reactions / problems with new medications (rash/itching, nausea, etc)  Fever over 101.5 F (38.5 C) Inability to urinate Nausea and/or vomiting Worsening swelling or bruising Continued bleeding from incision. Increased pain, redness, or drainage from the incision  The clinic staff is available to answer your questions during regular business hours (8:30am-5pm).  Please don't hesitate to call and ask to speak to one of our nurses for clinical concerns.   A surgeon from [facility] is always on call at the hospitals   If you have a medical emergency, go to the nearest emergency room or call 911.    [facility], [place], [place], [place], [place]  [address] ? MAIN: [phone] ? TOLL FREE: [phone] ? FAX [phone] [email or web address]  #####################################################    Pelvic floor muscle training exercises ("Kegels") can help strengthen the muscles under the uterus, bladder, and bowel (large intestine). They can help both men and women who have problems with urine leakage or bowel control.  A pelvic floor muscle training exercise is like pretending that you have to urinate, and then holding it. You relax and tighten the muscles  that control urine flow. It's important to find the right muscles to tighten.  The next time you have to urinate, start to go and then stop. Feel the muscles in your vagina, bladder, or anus get tight and move up. These are the pelvic floor muscles. If you feel them tighten, you've done the exercise right. If you are still not sure whether you are tightening the right muscles, keep in mind that all of the muscles of the pelvic  floor relax and contract at the same time. Because these muscles control the bladder, rectum, and vagina, the following tips may help: Women: Insert a finger into your vagina. Tighten the muscles as if you are holding in your urine, then let go. You should feel the muscles tighten and move up and down.  Men: Insert a finger into your rectum. Tighten the muscles as if you are holding in your urine, then let go. You should feel the muscles tighten and move up and down. These are the same muscles you would tighten if you were trying to prevent yourself from passing gas.  It is very important that you keep the following muscles relaxed while doing pelvic floor muscle training exercises: Abdominal  Buttocks (the deeper, anal sphincter muscle should contract)  Thigh   A woman can also strengthen these muscles by using a vaginal cone, which is a weighted device that is inserted into the vagina. Then you try to tighten the pelvic floor muscles to hold the device in place. If you are unsure whether you are doing the pelvic floor muscle training correctly, you can use biofeedback and electrical stimulation to help find the correct muscle group to work. Biofeedback is a method of positive reinforcement. Electrodes are placed on the abdomen and along the anal area. Some therapists place a sensor in the vagina in women or anus in men to monitor the contraction of pelvic floor muscles.  A monitor will display a graph showing which muscles are contracting and which are at rest. The therapist  can help find the right muscles for performing pelvic floor muscle training exercises.   PERFORMING PELVIC FLOOR EXERCISES: 1. Begin by emptying your bladder. 2. Tighten the pelvic floor muscles and hold for a count of 10. 3. Relax the muscles completely for a count of 10. 4. Do 10 repititions, 3 to 5 times a day (morning, afternoon, and night). You can do these exercises at any time and any place. Most people prefer to do the exercises while lying down or sitting in a chair. After 4 - 6 weeks, most people notice some improvement. It may take as long as 3 months to see a major change. After a couple of weeks, you can also try doing a single pelvic floor contraction at times when you are likely to leak (for example, while getting out of a chair). A word of caution: Some people feel that they can speed up the progress by increasing the number of repetitions and the frequency of exercises. However, over-exercising can instead cause muscle fatigue and increase urine leakage. If you feel any discomfort in your abdomen or back while doing these exercises, you are probably doing them wrong. Breathe deeply and relax your body when you are doing these exercises. Make sure you are not tightening your stomach, thigh, buttock, or chest muscles. When done the right way, pelvic floor muscle exercises have been shown to be very effective at improving urinary continence.  Pelvic Floor Pain / Incontinence  Do you suffer from pelvic pain or incontinence? Do you have pain in the pelvis, low back or hips that is associated with sitting, walking, urination or intercourse? Have you experienced leaking of urine or feces when coughing, sneezing or laughing? Do you have pain in the pelvic area associated with cancer?  These are conditions that are common with pelvic floor muscle dysfunction. Over time, due to stress, scar tissue, surgeries and the natural course of aging, our muscles may become weak or  overstressed and can  spasm. This can lead to pain, weakness, incontinence or decreased quality of life.  Men and women with pelvic floor dysfunction frequently describe:  A "falling out" feeling. Pain or burning in the abdomen, tailbone or perineal area. Constipation or bowel elimination problems or difficulty initiating urination. Unresolved low back or hip pain. Frequency and urgency when going to the bathroom. Leaking of urine or feces. Pain with intercourse.  [email or web address]  To make a referral or for more information about [facility] Pelvic Floor Therapy Program, call  [facility]) - [place] [phone] [place]) - [facility]) [phone]   #########################

## 2021-12-31 NOTE — Op Note (Signed)
12/31/2021  6:05 PM  PATIENT:  Bradley Sheppard  81 y.o. male  Patient Care Team: [name], MD as PCP - General (Internal Medicine) Satira Sark, MD as PCP - Cardiology (Cardiology) Derek Jack, MD as Medical Oncologist (Hematology) Gala Romney Cristopher Estimable, MD as Consulting Physician (Gastroenterology) Michael Boston, MD as Consulting Physician (General Surgery)  PRE-OPERATIVE DIAGNOSIS:  LARGE BLEEDING RECTAL POLYP  POST-OPERATIVE DIAGNOSIS:  LARGE BLEEDING RECTAL POLYP  PROCEDURE:  PARTIAL PROTECTOMY BY TEM (transanal endoscopic microsurgery)  SURGEON:  Adin Hector, MD  ASSISTANT: OR Staff   ANESTHESIA:   local and general  EBL:  Total I/O In: 1100 [I.V.:1000; IV Piggyback:100] Out: 20 [Blood:20]  Delay start of Pharmacological VTE agent (>24hrs) due to surgical blood loss or risk of bleeding:  no  DRAINS: none   SPECIMEN: Adenomatous rectal polyp  (see OR findings below)  DISPOSITION OF SPECIMEN:  PATHOLOGY  COUNTS:  YES  PLAN OF CARE: Admit for overnight observation  PATIENT DISPOSITION:  PACU - hemodynamically stable.  INDICATION: Pleasant elderly gentleman who noticed some leaking and bleeding at the anus.  Underwent endoscopy and found to have soft but bulky distal rectal mass.  Concerning for adenomatous polyp.  Surgical consultation requested.  I offered transanal resection  The anatomy & physiology of the digestive tract was discussed.  The pathophysiology of the rectal pathology was discussed.  Natural history risks without surgery was discussed.   I feel the risks of no intervention will lead to serious problems that outweigh the operative risks; therefore, I recommended surgery.    Laparoscopic & open abdominal techniques were discussed.  I recommended we start with a partial proctectomy by transanal endoscopic microsurgery (TEM) for excisional biopsy to remove the pathology and hopefully cure and/or control the pathology.  This technique  can offer less operative risk and faster post-operative recovery.  Possible need for immediate or later abdominal surgery for further treatment was discussed.   Risks such as bleeding, abscess, reoperation, ostomy, heart attack, death, and other risks were discussed.   I noted a good likelihood this will help address the problem.  Goals of post-operative recovery were discussed as well.  We will work to minimize complications.  An educational handout was given as well.  Questions were answered.  The patient expresses understanding & wishes to proceed with surgery.  OR FINDINGS: Bulky sessile but soft distal rectal mass primarily focused in the left lateral region but extending right anterior.  The resulting mass was 6x5 cm in size  Pin placement on pathology specimen: Proximal margin: Pink Distal margin:Green Right lateral: Red Left lateral:  Yellow  The closure rests 0-2cm from the anal verge. It is 55% of the circumference (going from right anterior to left posterior midline)  DESCRIPTION: Informed consent was confirmed.  Patient received general anesthesia without difficulty.  Foley catheter sterilely placed.  Sequential compression devices active during the entire case.  The patient was placed in the prone/jackknife with split leg for legsposition, taking extra care to secure and protect the patient appropriately.  The perineum and perianal regions were prepped and draped in a sterile fashion.  Surgical timeout confirmed our plan.  I did a gentle digital rectal examination with gradual anal dilation to allow placement of the 4 cm TEO Stortz scope.  This was secured to the bed using the TEO clamping system.  We induced carbon dioxide insufflation intraluminally.  I oriented the Stortz scope and the patient such that the specimen rested towards  the floor at the 6:00 position.  I could easily identify the mass.  I went ahead and used tip point cautery to mark 1 cm margins circumferentially.  I  then did a full thickness transection at the distal margin which was halfway down the anal canal..  I came around laterally.  Switched over to harmonic dissection.  Lifted the specimen off the pelvic canal and prostate for a good deep margin.  I gradually came more proximally.  Transected at the proximal margin.  I ensured hemostasis.  I inspected the main specimen and pinned it on thick cork board.  Anoderm contracted a little bit but certainly had good margins pins as noted above.  I walked the specimen down to pathology and showed the [facility] pathology team for proper orientation.    I went back in and scrubbed in.  Hemostasis was good.  Patient's rectum was soft and redundant and the proximal edge could reach down to the anal verge.  I was able to see using a Paediatric nurse occasionally with a Office manager.  I reapproximated the wound transversely with a 2-0 Vicryl horizontal mattress stitch to bring the middle part of the rectum down to help close the middle of the wound.  I closed the wound using a 3-0 V-lock serrated absorbable suture in a Connell running fashion from each corner, meeting in the center of the closure & overlapping the running closure.  This brought things together well.  Closure had a gentle smiling curve with the lateral edges more proximal in the middle the edge involving the anal canal and the anterior and left anterior region I did meticulous inspection with fine tip instruments to confirm good watertight closure   Hemostasis excellent.  The lumen was quite patent.  Carbon dioxide evacuated & instruments removed.  The patient is being extubated go to recovery room.  I discussed operative findings, updated the patient's status, discussed probable steps to recovery, and gave postoperative recommendations to the patient's spouse, [place].  Recommendations were made.  Questions were answered.  She expressed understanding & appreciation.   Adin Hector, MD, FACS,  [facility]    [address] [place], [place] [address] [phone] Main / Paging [phone] Fax

## 2021-12-31 NOTE — Anesthesia Postprocedure Evaluation (Signed)
Anesthesia Post Note  Patient: Bradley Sheppard  Procedure(s) Performed: Celedonio Miyamoto PARTIAL PROTECTOMY     Patient location during evaluation: PACU Anesthesia Type: General Level of consciousness: awake and alert Pain management: pain level controlled Vital Signs Assessment: post-procedure vital signs reviewed and stable Respiratory status: spontaneous breathing, nonlabored ventilation, respiratory function stable and patient connected to nasal cannula oxygen Cardiovascular status: blood pressure returned to baseline and stable Postop Assessment: no apparent nausea or vomiting Anesthetic complications: no   No notable events documented.  Last Vitals:  Vitals:   12/31/21 1915 12/31/21 1937  BP: (!) 169/77 (!) 177/67  Pulse: 67 84  Resp: 17 18  Temp: (!) 36.4 C (!) 36.4 C  SpO2: 99% 99%    Last Pain:  Vitals:   12/31/21 1937  TempSrc: Oral  PainSc:                  [place] Lighter

## 2021-12-31 NOTE — Transfer of Care (Signed)
Immediate Anesthesia Transfer of Care Note  Patient: Bradley Sheppard  Procedure(s) Performed: Bradley Sheppard PARTIAL PROTECTOMY  Patient Location: PACU  Anesthesia Type:General  Level of Consciousness: awake, alert  and oriented  Airway & Oxygen Therapy: Patient Spontanous Breathing and Patient connected to face mask oxygen  Post-op Assessment: Report given to RN and Post -op Vital signs reviewed and stable  Post vital signs: Reviewed and stable  Last Vitals:  Vitals Value Taken Time  BP    Temp    Pulse 69 12/31/21 1821  Resp 13 12/31/21 1821  SpO2 100 % 12/31/21 1821  Vitals shown include unvalidated device data.  Last Pain:  Vitals:   12/31/21 1024  TempSrc:   PainSc: 0-No pain         Complications: No notable events documented.

## 2021-12-31 NOTE — H&P (Signed)
12/31/2021    Patient Care Team: [name], MD as PCP - General (Internal Medicine) Gala Romney Cristopher Estimable, MD (Gastroenterology) Satira Sark, MD (Cardiovascular Disease) Johney Maine, Adrian Saran, MD as Consulting Provider (General Surgery)  PROVIDER: Hollace Kinnier, MD  [facility] MRN: [id number] DOB: 1940-09-09  SUBJECTIVE   Chief Complaint: New Patient (Eval transanal resection )   History of Present Illness: Bradley Sheppard is a 81 y.o. male who is seen today  as an office consultation at the request of Dr. Gala Romney for evaluation of rectal mass.   Elderly patient with numerous medical issues. Coronary disease, aortic stenosis, hypertension, hyperlipidemia, chronic kidney disease stage IIIb, diabetes, GERD. Hip and knee pain with decreased activity. Non-Hodgkin's lymphoma. Found to have some iron deficiency anemia and heme positive stools. Underwent colonoscopy by Dr. Work. Large flat polyp noted in the distal rectum. Biopsies do not show definite cancer or high-grade dysplasia but at least adenomatous polyps. Given the size and bleeding symptoms, surgical consultation requested for transanal resection.  Patient comes today in a wheelchair pushed by his daughter. It is a struggle for him to get to the bathroom with a walker. Has had issues with fall imbalances. Gets short of breath. He claims to move his bowels every day or so. Patient's wife notes that he has had more accidents with some leaking. He is afraid to eat as he has some urgency and leaking. Staying at home more often now. He had an appendectomy when he was a teenager. No other abdominal surgeries. More recently was diagnosed with non-Hodgkin's lymphoma and is getting treatment for that.  Medical History:  Past Medical History:  Diagnosis Date  Anemia  Arthritis  CHF (congestive heart failure) (CMS-HCC)  Heart valve disease  Hyperlipidemia  Hypertension   Patient Active Problem List  Diagnosis  Adenomatous rectal  polyp  Fecal incontinence alternating with constipation   Past Surgical History:  Procedure Laterality Date  APPENDECTOMY    Allergies  Allergen Reactions  Prednisone Unknown  Sugar to increase   Current Outpatient Medications on File Prior to Visit  Medication Sig Dispense Refill  amLODIPine (NORVASC) 10 MG tablet Take 10 mg by mouth once daily  glipiZIDE (GLUCOTROL XL) 10 MG XL tablet Take 10 mg by mouth 2 (two) times daily  metFORMIN (GLUCOPHAGE) 500 MG tablet Take by mouth  ramipriL (ALTACE) 10 MG capsule Take 10 mg by mouth 2 (two) times daily  tiZANidine (ZANAFLEX) 2 MG tablet Take 2 mg by mouth 2 (two) times daily as needed for Muscle spasms  valsartan (DIOVAN) 320 MG tablet Take by mouth  acetaminophen (TYLENOL) 500 MG tablet Take 500 mg by mouth every 6 (six) hours as needed  ascorbic acid, vitamin C, 500 mg Chew Take by mouth  cyanocobalamin (VITAMIN B12) 500 MCG tablet Take by mouth  rosuvastatin (CRESTOR) 20 MG tablet Take 20 mg by mouth once daily   No current facility-administered medications on file prior to visit.   Family History  Problem Relation Age of Onset  High blood pressure (Hypertension) Mother  Coronary Artery Disease (Blocked arteries around heart) Mother  Diabetes Mother    Social History   Tobacco Use  Smoking Status Former  Types: Cigarettes  Quit date: 1984  Years since quitting: 39.5  Smokeless Tobacco Never    Social History   Socioeconomic History  Marital status: Married  Tobacco Use  Smoking status: Former  Types: Cigarettes  Quit date: 1984  Years since quitting: 39.5  Smokeless tobacco: Never  Substance and Sexual Activity  Alcohol use: Never  Drug use: Never   ############################################################  Review of Systems: A complete review of systems (ROS) was obtained from the patient. I have reviewed this information and discussed as appropriate with the patient. See HPI as well for other  pertinent ROS.  Constitutional: No fevers, chills, sweats. Weight stable Eyes: No vision changes, No discharge HENT: No sore throats, nasal drainage Lymph: No neck swelling, No bruising easily Pulmonary: No cough, productive sputum CV: No orthopnea, PND . No exertional chest/neck/shoulder/arm pain. Patient can walk 1 room but needs assistance.   GI: No personal nor family history of GI/colon cancer, inflammatory bowel disease, irritable bowel syndrome, allergy such as Celiac Sprue, dietary/dairy problems, colitis, ulcers nor gastritis. No recent sick contacts/gastroenteritis. No travel outside the country. No changes in diet.  Renal: No UTIs, No hematuria Genital: No drainage, bleeding, masses Musculoskeletal: Stiffness especially on left knee. No severe joint pain. Fair ROM major joints Skin: No sores or lesions Heme/Lymph: No easy bleeding. No swollen lymph nodes Neuro: No active seizures. No facial droop Psych: No hallucinations. No agitation  OBJECTIVE   Vitals:  11/11/21 1038  BP: (!) 148/72  Pulse: 72  Temp: 36.6 C (97.9 F)  SpO2: 96%  Weight: 99.9 kg (220 lb 3.2 oz)  Height: 182.9 cm (6')   Body mass index is 29.86 kg/m.  PHYSICAL EXAM:  Constitutional: Not cachectic. Hygeine adequate. Vitals signs as above.  Eyes: No glasses. Vision adequate,Pupils reactive, normal extraocular movements. Sclera nonicteric Neuro: CN II-XII intact. No major focal sensory defects. No major motor deficits. Lymph: No head/neck/groin lymphadenopathy Psych: A little slow to respond. He likes to reminisce but is very pleasant. No severe agitation. No severe anxiety. Judgment & insight Adequate, Oriented x4, HENT: Normocephalic, Mucus membranes moist. No thrush. Hearing: impaired Neck: Supple, No tracheal deviation. No obvious thyromegaly Chest: No pain to chest wall compression. Good respiratory excursion. No audible wheezing CV: Pulses intact. regular. No major extremity edema Ext: No  obvious deformity or contracture. Edema: Present at: BLE 3+ . No cyanosis Skin: No major subcutaneous nodules. Warm and dry Musculoskeletal: Severe joint rigidity Noted at knees . No obvious clubbing. No digital petechiae. Mobility: In wheelchair two-person assist with walker to get up to the bed.  Abdomen: Obese Soft. Nondistended. Nontender. Hernia: Not present. Diastasis recti: Mild supraumbilical midline. No hepatomegaly. No splenomegaly.  Genital/Pelvic: Inguinal hernia: Not present. Inguinal lymph nodes: without lymphadenopathy nor hidradenitis.   Rectal: Perianal skin clear. No obvious fissure fistula or abscess. Sphincter tone intact.  Sensitive but tolerates digital exam. Soft stool in vault. I can clearly palpate a rectal mass. About 4 x 3 cm involving about a quarter-third of the anterior rectal wall overlying the prostate. Base is more narrow. Somewhat rubbery but no hard nodular areas. Mobile.    ###################################################################  Labs, Imaging and Diagnostic Testing:  Located in [facility]' section of Epic EMR chart  PRIOR [facility] CLINIC NOTES:  Not applicable  SURGERY NOTES:  Not applicable  PATHOLOGY:  Located in [place]' section of Epic EMR chart  Assessment and Plan:  DIAGNOSES:  Diagnoses and all orders for this visit:  Adenomatous rectal polyp  Fecal incontinence alternating with constipation  Other orders - polyethylene glycol (MIRALAX) powder; Take 233.75 g by mouth once for 1 dose Take according to your procedure prep instructions. - bisacodyL (DULCOLAX) 5 mg EC tablet; Take 4 tablets (20 mg total) by mouth once daily as needed for Constipation for  up to 1 dose - metroNIDAZOLE (FLAGYL) 500 MG tablet; Take 2 tablets (1,000 mg total) by mouth 3 (three) times daily for 3 doses SEE BOWEL PREP INSTRUCTIONS: Take 2 tablets at 2pm, 3pm, and 10pm the day prior to your colon operation. - neomycin 500 mg tablet;  Take 2 tablets (1,000 mg total) by mouth 3 (three) times daily for 3 doses SEE BOWEL PREP INSTRUCTIONS: Take 2 tablets at 2pm, 3pm, and 10pm the day prior to your colon operation.    ASSESSMENT/PLAN  Moderate size very distal anterior rectal polyp with adenomatous changes causing bleeding and some urgency with overflow fecal incontinence given its proximity close to the anal sphincters.  Standard care is transanal excision. Given how distal it is I would do a TEM approach with probable [place] transanal closure. General anesthesia. Prone split leg positioning. Hopefully just a couple hours.  Normally in this situation, the patient can go home and treated like a hemorrhoidectomy. However given his deconditioned state and being wheelchair-bound and multiple medical problems, he needs to be at least observed overnight. Probably have medicine consulted to help follow him as well with me. May need to stay several days if he struggles with other health issues. May need to be at a skilled facility to recover. The fact that his spouse and daughter are here to support him is a hopeful sign. We will see.  I did caution that it is common to struggle with rectal pain especially the first few weeks. Incontinence to flatus and stools can happen at first but usually the further out he gets easier to manage. I cautioned that his defecation may not completely return to normal or be a challenge but usually improves. Some people benefit from pelvic floor physical therapy. I am not going to have to breach or injure the sphincters, so hopefully these will be temporary annoyances. We will have to see.  He is higher risk in my mind but given the location and size and symptoms, this will only get worse over time. Patient - and especially his wife and daughter - wish to be aggressive. We will do our best to be as safe and thorough as possible.  The anatomy & physiology of the digestive tract was discussed. The  pathophysiology of the rectal pathology was discussed. Natural history risks without surgery was discussed. I feel the risks of no intervention will lead to serious problems that outweigh the operative risks; therefore, I recommended surgery.   Laparoscopic & open abdominal techniques were discussed. I recommended we start with a partial proctectomy by transanal endoscopic microsurgery (TEM) vs transanal minimally invasive surgery (TAMIS) for excisional biopsy to remove the pathology and hopefully cure and/or control the pathology. This technique can offer less operative risk and faster post-operative recovery. Possible need for immediate or later abdominal surgery for further treatment was discussed.   Risks such as bleeding, abscess, reoperation, ostomy, heart attack, death, and other risks were discussed. I noted a good likelihood this will help address the problem. Goals of post-operative recovery were discussed as well. We will work to minimize complications. An educational handout was given as well. Questions were answered. The patient expresses understanding & wishes to proceed with surgery.  Cardiac clearance done. Not low risk but indeterminate: "Preoperative cardiac evaluation: According to the Revised Cardiac Risk Index (RCRI), his Perioperative Risk of Major Cardiac Event is (%): 0.9. His Functional Capacity in METs is: 2.96 according to the [facility] Activity Status Index (DASI). Patient is at intermediate  risk for surgery given past medical history, comorbidities. However, this is overall a low risk surgery. He is unable to complete 4 METS, however, this has been his baseline since October 2023. Decreased METS likely multifactorial in the setting of physical deconditioning secondary to orthopedic issues, iron deficiency anemia, and non-Hodgkin's lymphoma. He denies symptoms concerning for angina. Discussed with Dr. Ellyn Hack, DOD, who does not recommend any additional testing prior to surgery.  Additionally, per Dr. Myles Gip telephone encounter dated 10/02/2021, he is at intermediate risk given cardiac history, however, this does "not preclude surgery." Therefore, based on ACC/AHA guidelines, patient would be at acceptable risk for the planned procedure without further cardiovascular testing. I will route this recommendation to the requesting party via [place] fax function."   Adin Hector, MD, FACS, MASCRS Esophageal, Gastrointestinal & Colorectal Surgery Robotic and Minimally Invasive Surgery  [facility] [address] [place], [place] [address] [phone] Fax [phone] Main  CONTACT INFORMATION:  Weekday (9AM-5PM): Call [facility] main office at [phone]  Weeknight (5PM-9AM) or Weekend/Holiday: Check [email or web address] (password " [email or web address]") for General Surgery [facility] coverage  (Please, do not use SecureChat as it is not reliable communication to reach operating surgeons for immediate patient care)    12/31/2021

## 2022-01-01 ENCOUNTER — Other Ambulatory Visit: Payer: Self-pay

## 2022-01-01 ENCOUNTER — Encounter (HOSPITAL_COMMUNITY): Payer: Self-pay | Admitting: Surgery

## 2022-01-01 LAB — CBC
HCT: 28.3 % — ABNORMAL LOW (ref 39.0–52.0)
Hemoglobin: 9.3 g/dL — ABNORMAL LOW (ref 13.0–17.0)
MCH: 31.5 pg (ref 26.0–34.0)
MCHC: 32.9 g/dL (ref 30.0–36.0)
MCV: 95.9 fL (ref 80.0–100.0)
Platelets: 175 10*3/uL (ref 150–400)
RBC: 2.95 MIL/uL — ABNORMAL LOW (ref 4.22–5.81)
RDW: 13.8 % (ref 11.5–15.5)
WBC: 9.8 10*3/uL (ref 4.0–10.5)
nRBC: 0 % (ref 0.0–0.2)

## 2022-01-01 LAB — BASIC METABOLIC PANEL
Anion gap: 5 (ref 5–15)
BUN: 36 mg/dL — ABNORMAL HIGH (ref 8–23)
CO2: 23 mmol/L (ref 22–32)
Calcium: 8.3 mg/dL — ABNORMAL LOW (ref 8.9–10.3)
Chloride: 106 mmol/L (ref 98–111)
Creatinine, Ser: 1.34 mg/dL — ABNORMAL HIGH (ref 0.61–1.24)
GFR, Estimated: 53 mL/min — ABNORMAL LOW (ref >60)
Glucose, Bld: 236 mg/dL — ABNORMAL HIGH (ref 70–99)
Potassium: 4.6 mmol/L (ref 3.5–5.1)
Sodium: 134 mmol/L — ABNORMAL LOW (ref 135–145)

## 2022-01-01 LAB — GLUCOSE, CAPILLARY
Glucose-Capillary: 213 mg/dL — ABNORMAL HIGH (ref 70–99)
Glucose-Capillary: 190 mg/dL — ABNORMAL HIGH (ref 70–99)

## 2022-01-01 LAB — MAGNESIUM: Magnesium: 1.6 mg/dL — ABNORMAL LOW (ref 1.7–2.4)

## 2022-01-01 MED ORDER — INSULIN ASPART 100 UNIT/ML IJ SOLN: 0.0000 [IU] | Freq: Every day | INTRAMUSCULAR | Status: DC

## 2022-01-01 MED ORDER — GLIPIZIDE ER 5 MG PO TB24
10.0000 mg | ORAL_TABLET | Freq: Two times a day (BID) | ORAL | Status: DC
  Administered 2022-01-01 – 2022-01-02 (×2): 10 mg via ORAL
  Filled 2022-01-01 (×2): qty 2

## 2022-01-01 MED ORDER — ENOXAPARIN SODIUM 40 MG/0.4ML IJ SOSY
40.0000 mg | PREFILLED_SYRINGE | INTRAMUSCULAR | Status: DC
  Filled 2022-01-01: qty 0.4

## 2022-01-01 MED ORDER — INSULIN ASPART 100 UNIT/ML IJ SOLN
0.0000 [IU] | Freq: Three times a day (TID) | INTRAMUSCULAR | Status: DC
  Administered 2022-01-01: 5 [IU] via SUBCUTANEOUS

## 2022-01-01 MED ORDER — CARVEDILOL 6.25 MG PO TABS
6.2500 mg | ORAL_TABLET | Freq: Two times a day (BID) | ORAL | Status: DC
  Administered 2022-01-01 – 2022-01-02 (×2): 6.25 mg via ORAL
  Filled 2022-01-01 (×2): qty 1

## 2022-01-01 NOTE — Progress Notes (Signed)
Dr Johney Maine aware via phone of moderate to large amount of blood rectally from incision site this morning via message to circulating RN in OR. Pt asymptomatic.

## 2022-01-01 NOTE — Progress Notes (Signed)
Dr Johney Maine paged again regarding large amounts of blood expressed rectally. Asymptomatic except BP lower this afternoon. Pt has used BSC 3 times. See new orders.

## 2022-01-01 NOTE — Evaluation (Signed)
Occupational Therapy Evaluation Patient Details Name: Bradley Sheppard MRN: [id number] DOB: 11-25-1940 Today's Date: 01/01/2022   History of Present Illness Bradley Sheppard is a 81 year old male s/p Parital Protectomy 2* large bleeding polyp 12/31/21.  Pt with extensive history of DM 2, HTN, HLD,MALT lymphoma,   Clinical Impression   Bradley Sheppard is an 81 year old man admitted to hospital with above medical history and presents with generalized weakness, decreased activity tolerance, and impaired balance. Noted to have decreased ROM in RLE and weak dorsiflexors. Patient needing min assist to ambulate with walker and increased assistance with ADLs including max-total assist for LB ADLs and toileting.  Patient reports having assistance for ADLs at prior level from his wife. He uses a walker at home. Recommend [facility] services at discharge to maximize functional abilities. No acute care needs.        Recommendations for follow up therapy are one component of a multi-disciplinary discharge planning process, led by the attending physician.  Recommendations may be updated based on patient status, additional functional criteria and insurance authorization.   Follow Up Recommendations  Home health OT    Assistance Recommended at Discharge Frequent or constant Supervision/Assistance  Patient can return home with the following A little help with walking and/or transfers;A lot of help with bathing/dressing/bathroom;Assistance with cooking/housework;Help with stairs or ramp for entrance    Functional Status Assessment  Patient has had a recent decline in their functional status and demonstrates the ability to make significant improvements in function in a reasonable and predictable amount of time.  Equipment Recommendations  None recommended by OT    Recommendations for Other Services       Precautions / Restrictions Precautions Precautions: Fall Precaution Comments: weak  dorsiflexors Restrictions Weight Bearing Restrictions: No      Mobility Bed Mobility Overal bed mobility: Needs Assistance Bed Mobility: Supine to Sit           General bed mobility comments: use of bedrail and increased time but no physical assist    Transfers Overall transfer level: Needs assistance Equipment used: Rolling walker (2 wheels) Transfers: Sit to/from Stand Sit to Stand: Min assist, Mod assist, From elevated surface           General transfer comment: Mod assist from bed, min assist from chair. Ambulated in hallway with RW      Balance Overall balance assessment: Needs assistance Sitting-balance support: No upper extremity supported, Feet supported Sitting balance-Leahy Scale: Good     Standing balance support: Reliant on assistive device for balance, Bilateral upper extremity supported                               ADL either performed or assessed with clinical judgement   ADL Overall ADL's : Needs assistance/impaired Eating/Feeding: Independent   Grooming: Set up;Sitting   Upper Body Bathing: Set up;Sitting   Lower Body Bathing: Maximal assistance;Sit to/from stand   Upper Body Dressing : Moderate assistance;Sitting   Lower Body Dressing: Maximal assistance;Sit to/from stand   Toilet Transfer: Minimal assistance;BSC/3in1;Rolling walker (2 wheels)   Toileting- Clothing Manipulation and Hygiene: Total assistance;Sit to/from stand       Functional mobility during ADLs: Minimal assistance;Rolling walker (2 wheels)       Vision Patient Visual Report: No change from baseline       Perception     Praxis      Pertinent Vitals/Pain Pain Assessment Pain Assessment:  No/denies pain     Hand Dominance Right   Extremity/Trunk Assessment Upper Extremity Assessment Upper Extremity Assessment: Overall WFL for tasks assessed   Lower Extremity Assessment Lower Extremity Assessment: Defer to PT evaluation RLE Deficits /  Details: DF limited to neutral with 3+/5 strength LLE Deficits / Details: DF limited to neutral with 3+/5 strength on testing and noted frequent foot drag with ambulation.  Knee flex limited to ~ 25 degrees related to multiple knee surgeries starting in 2009 with TKR   Cervical / Trunk Assessment Cervical / Trunk Assessment: Kyphotic   Communication Communication Communication: HOH   Cognition Arousal/Alertness: Awake/alert Behavior During Therapy: WFL for tasks assessed/performed Overall Cognitive Status: Within Functional Limits for tasks assessed                                       General Comments       Exercises     Shoulder Instructions      Home Living Family/patient expects to be discharged to:: Private residence Living Arrangements: Spouse/significant other Available Help at Discharge: Available 24 hours/day;Family Type of Home: House Home Access: Stairs to enter [employer] of Steps: 1   Home Layout: One level     Bathroom Shower/Tub: Teacher, early years/pre: Standard Bathroom Accessibility: No   Home Equipment: Conservation officer, nature (2 wheels);Rollator (4 wheels);BSC/3in1;Tub bench;Grab bars - toilet;Transport chair   Additional Comments: Pt lives with wife with kids living by. Recently pt has been sleeping in recliner chair.      Prior Functioning/Environment Prior Level of Function : Needs assist;History of Falls (last six months)             Mobility Comments: [place] ambulation with RW with PRN assistance from spouse. ADLs Comments: Assisted for bathing and dressing. Uses depends at home currently.        OT Problem List: Decreased strength;Impaired balance (sitting and/or standing);Decreased activity tolerance;Decreased knowledge of use of DME or AE      OT Treatment/Interventions:      OT Goals(Current goals can be found in the care plan section) Acute Rehab OT Goals OT Goal Formulation: All assessment  and education complete, DC therapy  OT Frequency:      Co-evaluation PT/OT/SLP Co-Evaluation/Treatment: Yes (coeval) Reason for Co-Treatment: To address functional/ADL transfers;For patient/therapist safety PT goals addressed during session: Mobility/safety with mobility OT goals addressed during session: ADL's and self-care      AM-PAC OT "6 Clicks" Daily Activity     Outcome Measure Help from another person eating meals?: None Help from another person taking care of personal grooming?: A Little Help from another person toileting, which includes using toliet, bedpan, or urinal?: Total Help from another person bathing (including washing, rinsing, drying)?: A Lot Help from another person to put on and taking off regular upper body clothing?: A Lot Help from another person to put on and taking off regular lower body clothing?: Total 6 Click Score: 13   End of Session Equipment Utilized During Treatment: Rolling walker (2 wheels);Gait belt Nurse Communication:  (bleeding and need for [facility])  Activity Tolerance: Patient tolerated treatment well Patient left: in chair;with call bell/phone within reach;with chair alarm set  OT Visit Diagnosis: Muscle weakness (generalized) (M62.81)                Time: 2778-2423 OT Time Calculation (min): 27 min Charges:  OT General Charges $OT  Visit: 1 Visit OT Evaluation $OT Eval Low Complexity: 1 Low  Elexus Barman, OTR/L Acute Care Rehab Services  Office [phone]   Bradley Sheppard 01/01/2022, 9:43 AM

## 2022-01-01 NOTE — Progress Notes (Deleted)
  Transition of Care [facility]) Screening Note   Patient Details  Name: Bradley Sheppard Date of Birth: 26-May-1940   Transition of Care [facility]) CM/SW Contact:    Lennart Pall, LCSW Phone Number: 01/01/2022, 8:55 AM    Transition of Care Department [facility]) has reviewed patient and no TOC needs have been identified at this time. We will continue to monitor patient advancement through interdisciplinary progression rounds. If new patient transition needs arise, please place a TOC consult.

## 2022-01-01 NOTE — Progress Notes (Addendum)
Bradley Sheppard [id number] 1940/05/22  CARE TEAM:  PCP: [name], MD  Outpatient Care Team: Patient Care Team: [name], MD as PCP - General (Internal Medicine) Satira Sark, MD as PCP - Cardiology (Cardiology) Derek Jack, MD as Medical Oncologist (Hematology) Gala Romney Cristopher Estimable, MD as Consulting Physician (Gastroenterology) Michael Boston, MD as Consulting Physician (General Surgery)  Inpatient Treatment Team: Treatment Team: Attending Provider: Michael Boston, MD; Utilization Review: Beau Fanny, RN; Technician: Jeralyn Ruths, NT; Registered Nurse: Tanda Rockers, RN; Social Worker: Lowella Curb, [place]   Problem List:   Principal Problem:   Mass in rectum Active Problems:   Mixed hyperlipidemia   Essential hypertension, benign   CORONARY ATHEROSCLEROSIS NATIVE CORONARY ARTERY   Aortic stenosis   Iron deficiency anemia   Acute heart failure with preserved ejection fraction (HFpEF) (HCC)   DMII (diabetes mellitus, type 2) (HCC)   Chronic kidney disease, stage 3b ([place])   1 Day Post-Op  12/31/2021  POST-OPERATIVE DIAGNOSIS:  LARGE BLEEDING RECTAL POLYP   PROCEDURE:  PARTIAL PROTECTOMY BY TEM (transanal endoscopic microsurgery)   SURGEON:  Adin Hector, MD   OR FINDINGS: Bulky sessile but soft distal rectal mass primarily focused in the left lateral region but extending right anterior.   The resulting mass was 6x5 cm in size   Pin placement on pathology specimen: Proximal margin: Pink Distal margin:Green Right lateral: Red Left lateral:  Yellow   The closure rests 0-2cm from the anal verge. It is 55% of the circumference (going from right anterior to left posterior midline)      Assessment  Recovering  [facility] Stay = 1 days)  Plan:  Rectal bleeding common - should taper off.  Fiber,iron, hold anticoag & follow Hgb  -solid diet Foley out - I&O cath PRN -BP control - threshold to lower/hold doses if needed DM  control - oral hypoglycemics - add back on & add SSI with hyperglycemia F/u pathology -VTE prophylaxis- SCDs, etc -mobilize as tolerated to help recovery  Disposition:  Disposition:  The patient is from: Home  Anticipate discharge to:  Home with Home Health  Anticipated Date of Discharge is:  September 1,2023    Barriers to discharge:  Pending Clinical improvement (more likely than not)  Patient currently is NOT MEDICALLY STABLE for discharge from the hospital from a surgery standpoint.      I reviewed nursing notes, last 24 h vitals and pain scores, last 48 h intake and output, last 24 h labs and trends, and last 24 h imaging results. I have reviewed this patient's available data, including medical history, events of note, test results, etc as part of my evaluation.  A significant portion of that time was spent in counseling.  Care during the described time interval was provided by me.  Pt asked many appropriate questions that I worked to answer   This care required high  level of medical decision making.  01/01/2022    Subjective: (Chief complaint)  Eating well No pain Had bloody BMs this afternoon - nurse & wife concerned Seen by PT - they rec HH PT  Objective:  Vital signs:  Vitals:   01/01/22 0138 01/01/22 0500 01/01/22 0507 01/01/22 1310  BP: (!) 143/70  (!) 142/78 (!) 110/51  Pulse: 69  62 (!) 57  Resp: '18  18 17  '$ Temp: 98.7 F (37.1 C)  98.3 F (36.8 C) 97.7 F (36.5 C)  TempSrc: Oral  Oral Oral  SpO2:  100%  99% 100%  Weight:  91.9 kg    Height:           Intake/Output   Yesterday:  08/30 0701 - 08/31 0700 In: 2384.6 [P.O.:480; I.V.:1804.6; IV Piggyback:100] Out: 2095 [Urine:2075; Blood:20] This shift:  Total I/O In: 480 [P.O.:480] Out: 0   Bowel function:  Flatus: YES  BM:  YES  Drain: (No drain)   Physical Exam:  General: Pt awake/alert in no acute distress. Eyes: PERRL, normal EOM.  Sclera clear.  No icterus Neuro: CN II-XII  intact w/o focal sensory/motor deficits. Lymph: No head/neck/groin lymphadenopathy Psych:  No delerium/psychosis/paranoia.  Oriented x 4.  Chatty & inquisitive HENT: Normocephalic, Mucus membranes moist.  No thrush Neck: Supple, No tracheal deviation.  No obvious thyromegaly Chest: No pain to chest wall compression.  Good respiratory excursion.  No audible wheezing CV:  Pulses intact.  Regular rhythm.  No major extremity edema MS: Normal AROM mjr joints.  No obvious deformity  Abdomen: Soft.  Nondistended.  Mildly tender at incisions only.  No evidence of peritonitis.  No incarcerated hernias.  Ext:   No deformity.  No mjr edema.  No cyanosis Skin: No petechiae / purpurea.  No major sores.  Warm and dry    Results:   Cultures: No results found for this or any previous visit (from the past 720 hour(s)).  Labs: Results for orders placed or performed during the hospital encounter of 12/31/21 (from the past 48 hour(s))  Glucose, capillary     Status: Abnormal   Collection Time: 12/31/21  9:46 AM  Result Value Ref Range   Glucose-Capillary 207 (H) 70 - 99 mg/dL    Comment: Glucose reference range applies only to samples taken after fasting for at least 8 hours.  Hemoglobin A1c     Status: Abnormal   Collection Time: 12/31/21 10:36 AM  Result Value Ref Range   Hgb A1c MFr Bld 7.2 (H) 4.8 - 5.6 %    Comment: (NOTE) Pre diabetes:          5.7%-6.4%  Diabetes:              >6.4%  Glycemic control for   <7.0% adults with diabetes    Mean Plasma Glucose 159.94 mg/dL    Comment: Performed at [facility] [address] [place], [place] [address]  Glucose, capillary     Status: Abnormal   Collection Time: 12/31/21 12:17 PM  Result Value Ref Range   Glucose-Capillary 151 (H) 70 - 99 mg/dL    Comment: Glucose reference range applies only to samples taken after fasting for at least 8 hours.  Glucose, capillary     Status: Abnormal   Collection Time: 12/31/21  2:45 PM  Result  Value Ref Range   Glucose-Capillary 104 (H) 70 - 99 mg/dL    Comment: Glucose reference range applies only to samples taken after fasting for at least 8 hours.  Glucose, capillary     Status: Abnormal   Collection Time: 12/31/21  6:22 PM  Result Value Ref Range   Glucose-Capillary 114 (H) 70 - 99 mg/dL    Comment: Glucose reference range applies only to samples taken after fasting for at least 8 hours.  Basic metabolic panel     Status: Abnormal   Collection Time: 01/01/22  4:58 AM  Result Value Ref Range   Sodium 134 (L) 135 - 145 mmol/L   Potassium 4.6 3.5 - 5.1 mmol/L   Chloride 106 98 -  111 mmol/L   CO2 23 22 - 32 mmol/L   Glucose, Bld 236 (H) 70 - 99 mg/dL    Comment: Glucose reference range applies only to samples taken after fasting for at least 8 hours.   BUN 36 (H) 8 - 23 mg/dL   Creatinine, Ser 1.34 (H) 0.61 - 1.24 mg/dL   Calcium 8.3 (L) 8.9 - 10.3 mg/dL   GFR, Estimated 53 (L) >60 mL/min    Comment: (NOTE) Calculated using the CKD-EPI Creatinine Equation (2021)    Anion gap 5 5 - 15    Comment: Performed at [facility], [place] [address] [place], [place] [address]  CBC     Status: Abnormal   Collection Time: 01/01/22  4:58 AM  Result Value Ref Range   WBC 9.8 4.0 - 10.5 K/uL   RBC 2.95 (L) 4.22 - 5.81 MIL/uL   Hemoglobin 9.3 (L) 13.0 - 17.0 g/dL   HCT 28.3 (L) 39.0 - 52.0 %   MCV 95.9 80.0 - 100.0 fL   MCH 31.5 26.0 - 34.0 pg   MCHC 32.9 30.0 - 36.0 g/dL   RDW 13.8 11.5 - 15.5 %   Platelets 175 150 - 400 K/uL   nRBC 0.0 0.0 - 0.2 %    Comment: Performed at [facility], [place] [address], [place], [place] [address]  Magnesium     Status: Abnormal   Collection Time: 01/01/22  4:58 AM  Result Value Ref Range   Magnesium 1.6 (L) 1.7 - 2.4 mg/dL    Comment: Performed at [facility], [place] [address], [place], [place] [address]    Imaging / Studies: No results found.  Medications / Allergies: per  chart  Antibiotics: Anti-infectives (From admission, onward)    Start     Dose/Rate Route Frequency Ordered Stop   01/01/22 0400  cefoTEtan (CEFOTAN) 2 g in sodium chloride 0.9 % 100 mL IVPB        2 g 200 mL/hr over 30 Minutes Intravenous Every 12 hours 12/31/21 1943 01/01/22 0539   12/31/21 1400  neomycin (MYCIFRADIN) tablet 1,000 mg  Status:  Discontinued       See Hyperspace for full Linked Orders Report.   1,000 mg Oral 3 times per day 12/31/21 0933 12/31/21 0942   12/31/21 1400  metroNIDAZOLE (FLAGYL) tablet 1,000 mg  Status:  Discontinued       See Hyperspace for full Linked Orders Report.   1,000 mg Oral 3 times per day 12/31/21 0933 12/31/21 0942   12/31/21 0945  cefoTEtan (CEFOTAN) 2 g in sodium chloride 0.9 % 100 mL IVPB        2 g 200 mL/hr over 30 Minutes Intravenous On call to O.R. 12/31/21 0933 12/31/21 1647         Note: Portions of this report may have been transcribed using voice recognition software. Every effort was made to ensure accuracy; however, inadvertent computerized transcription errors may be present.   Any transcriptional errors that result from this process are unintentional.    Adin Hector, MD, FACS, MASCRS Esophageal, Gastrointestinal & Colorectal Surgery Robotic and Minimally Invasive Surgery  [facility]. [address], [place], [place] [address] [phone] Fax [phone] Main  CONTACT INFORMATION:  Weekday (9AM-5PM): Call [facility] main office at [phone]  Weeknight (5PM-9AM) or Weekend/Holiday: Check [email or web address] (password " [email or web address]") for General Surgery [facility] coverage  (Please, do not use SecureChat as  it is not reliable communication to reach operating surgeons for immediate patient care)      01/01/2022  3:49 PM

## 2022-01-01 NOTE — Discharge Summary (Addendum)
Physician Discharge Summary    Patient ID: Bradley Sheppard MRN: [id number] DOB/AGE: March 12, 1941  81 y.o.  Patient Care Team: [name], MD as PCP - General (Internal Medicine) Satira Sark, MD as PCP - Cardiology (Cardiology) Derek Jack, MD as Medical Oncologist (Hematology) Daneil Dolin, MD as Consulting Physician (Gastroenterology) Michael Boston, MD as Consulting Physician (General Surgery)  Admit date: 12/31/2021  Discharge date: 01/02/2022  Hospital Stay = 1 days    Discharge Diagnoses:  Principal Problem:   Mass in rectum   1 Day Post-Op  12/31/2021  POST-OPERATIVE DIAGNOSIS:  LARGE BLEEDING RECTAL POLYP   PROCEDURE:  PARTIAL PROTECTOMY BY TEM (transanal endoscopic microsurgery)   SURGEON:  Adin Hector, MD  OR FINDINGS: Bulky sessile but soft distal rectal mass primarily focused in the left lateral region but extending right anterior.   The resulting mass was 6x5 cm in size   Pin placement on pathology specimen: Proximal margin: Pink Distal margin:Green Right lateral: Red Left lateral:  Yellow   The closure rests 0-2cm from the anal verge. It is 55% of the circumference (going from right anterior to left posterior midline)     Consults: Physical Therapy, Occupational Therapy, and Case Management / Social Work  Hospital Course:   The patient underwent the surgery above.  Postoperatively, the patient gradually mobilized and advanced to a solid diet.  Pain and other symptoms were treated aggressively.    By the time of discharge, the patient was walking well the hallways, eating food, having flatus.  Pain was well-controlled on an oral medications.  He did have some hematochezia that seem to resolve.  Hemoglobin drifted down to the eights.  Received IV iron.  Continue oral iron.  He had difficulty voiding and required I&O catheterization.  On second line Foley placed.  Urinary retention.  Discussed with urology and they agree with the  plan of continuing Foley catheter, starting Flomax to help shrink the prostate, and have him follow-up in urology office in about a week for possible voiding trial versus more aggressive intervention.    Based on meeting discharge criteria and continuing to recover, I felt it was safe for the patient to be discharged from the hospital to further recover with close followup. Postoperative recommendations were discussed in detail.  They are written as well.  Discharged Condition: fair but stable for him   Discharge Exam: Blood pressure (!) 142/78, pulse 62, temperature 98.3 F (36.8 C), temperature source Oral, resp. rate 18, height 6' (1.829 m), weight 91.9 kg, SpO2 99 %.  General: Pt awake/alert/oriented x4 in No acute distress Eyes: PERRL, normal EOM.  Sclera clear.  No icterus Neuro: CN II-XII intact w/o focal sensory/motor deficits. Lymph: No head/neck/groin lymphadenopathy Psych:  No delerium/psychosis/paranoia HENT: Normocephalic, Mucus membranes moist.  No thrush Neck: Supple, No tracheal deviation Chest:  No chest wall pain w good excursion CV:  Pulses intact.  Regular rhythm MS: Normal AROM mjr joints.  No obvious deformity Abdomen: Soft.  Nondistended.  Nontender.  No evidence of peritonitis.  No incarcerated hernias. Ext:  SCDs BLE.  No mjr edema.  No cyanosis Skin: No petechiae / purpura   Disposition:    Follow-up Information     Michael Boston, MD. Schedule an appointment as soon as possible for a visit in 3 week(s).   Specialties: General Surgery, Colon and Rectal Surgery Why: To follow up after your operation Contact information: [address] [place] [address] [phone]  Discharge disposition: 01-Home or Self Care       Discharge Instructions     Call MD for:  hives   Complete by: As directed    Call MD for:  persistant dizziness or light-headedness   Complete by: As directed    Call MD for:  persistant  nausea and vomiting   Complete by: As directed    Call MD for:  redness, tenderness, or signs of infection (pain, swelling, redness, odor or green/yellow discharge around incision site)   Complete by: As directed    You will often notice bleeding with bowel movements.  Expect some yellow or tan drainage.  This can occur for weeks, but should be mild by the end of the first week of surgery.  Wear an absorbent pad or soft cotton gauze in your underwear until the drainage stops   Call MD for:  severe uncontrolled pain   Complete by: As directed    Diet - low sodium heart healthy   Complete by: As directed    Discharge instructions   Complete by: As directed    See Rectal Surgery instruction sheet   Discharge wound care:   Complete by: As directed    -Allow the fluffed gauze to come off with the 1st bowel movement.  Reinforce as needed if you have bleeding or soiling  -Bathe / shower every day.  Just comfortable warm water.  Avoid salts/soaps.  Keep the area clean by showering / bathing over the perianal region.   Squeeze bottles of water work as well,.  It is okay to bathe even with an open wound to help wash it.  Can try ice packs as well.  -Wet wipes or showers / gentle washing after bowel movements is often less traumatic than regular toilet paper.  Consider using a squeeze bottle of warm water to rinse the perianal region.  -You will notice bleeding, yellow drainage, and occasional stool leaking, especially with bowel movements.  This should slow down by the end of the first week of surgery, but expect some drainage for many weeks.  Replace cotton balls/pads as needed  Wear an absorbent pad or soft cotton balls on the anus as needed to catch any drainage and help keep the area clean and dry.  This is less sticking/painful than regular gauze   Driving Restrictions   Complete by: As directed    You may drive when you are no longer taking prescription pain medication, you can comfortably sit  for long periods of time, and you can safely maneuver your car and apply brakes.  Expect at least 1-2 weeks   Increase activity slowly   Complete by: As directed    Lifting restrictions   Complete by: As directed    You may resume regular (light) daily activities beginning the next day-such as daily self-care, walking, climbing stairs-gradually increasing activities as tolerated.  If you can walk 30 minutes without difficulty, it is safe to try more intense activity such as jogging, treadmill, bicycling, low-impact aerobics, swimming, etc. Save the most intensive and strenuous activity for last such as sit-ups, heavy lifting, contact sports, etc  Refrain from any heavy lifting or straining until you are off narcotics for pain control.  Remember: if it hurts to do it, don't do it: STOP   May shower / Bathe   Complete by: As directed    Warm water sitz baths/tub soaks/showers  x 20-30 minutes, up to 8 times a day for comfort  Just  comfortable warm water.  Avoid salts/soaps.  Can try using ice packs as an alternative.  Consider using a squeeze bottle of warm water to rinse the perianal region.   May walk up steps   Complete by: As directed    Sexual Activity Restrictions   Complete by: As directed    You may have sexual intercourse when it is comfortable. If it hurts to do it, STOP.       Allergies as of 01/01/2022       Reactions   Prednisone Other (See Comments)   "makes him feel faint", hyperglycemic         Medication List     TAKE these medications    acetaminophen 500 MG tablet Commonly known as: TYLENOL Take 1,000 mg by mouth every 6 (six) hours as needed for moderate pain.   ascorbic acid 500 MG tablet Commonly known as: VITAMIN C Take 500 mg by mouth at bedtime.   aspirin EC 81 MG tablet Take 81 mg by mouth daily. Swallow whole.   carvedilol 12.5 MG tablet Commonly known as: COREG Take 1 tablet (12.5 mg total) by mouth 2 (two) times daily with a meal.    cyanocobalamin 500 MCG tablet Commonly known as: VITAMIN B12 Take 500 mcg by mouth daily.   ferrous sulfate 324 MG Tbec Take 324 mg by mouth 2 (two) times daily.   furosemide 40 MG tablet Commonly known as: LASIX Take 1 tablet (40 mg total) by mouth daily.   glipiZIDE 10 MG 24 hr tablet Commonly known as: GLUCOTROL XL Take 10 mg by mouth 2 (two) times daily.   LORazepam 1 MG tablet Commonly known as: ATIVAN Take 1 mg by mouth at bedtime.   metFORMIN 500 MG tablet Commonly known as: GLUCOPHAGE Take 500-1,000 mg by mouth See admin instructions. Take 1000 mg in the morning and 500 mg at night   nitroGLYCERIN 0.4 MG SL tablet Commonly known as: NITROSTAT Place 1 tablet (0.4 mg total) under the tongue every 5 (five) minutes as needed.   oxyCODONE 5 MG immediate release tablet Commonly known as: Oxy IR/ROXICODONE Take 0.5-1 tablets (2.5-5 mg total) by mouth every 6 (six) hours as needed for moderate pain, severe pain or breakthrough pain.   pioglitazone 45 MG tablet Commonly known as: ACTOS Take 45 mg by mouth daily.   rosuvastatin 5 MG tablet Commonly known as: CRESTOR Take 5 mg by mouth every Wednesday.   tiZANidine 2 MG tablet Commonly known as: ZANAFLEX Take 2 mg by mouth 2 (two) times daily.               Discharge Care Instructions  (From admission, onward)           Start     Ordered   01/01/22 0000  Discharge wound care:       Comments: -Allow the fluffed gauze to come off with the 1st bowel movement.  Reinforce as needed if you have bleeding or soiling  -Bathe / shower every day.  Just comfortable warm water.  Avoid salts/soaps.  Keep the area clean by showering / bathing over the perianal region.   Squeeze bottles of water work as well,.  It is okay to bathe even with an open wound to help wash it.  Can try ice packs as well.  -Wet wipes or showers / gentle washing after bowel movements is often less traumatic than regular toilet paper.  Consider  using a squeeze bottle of warm water to rinse  the perianal region.  -You will notice bleeding, yellow drainage, and occasional stool leaking, especially with bowel movements.  This should slow down by the end of the first week of surgery, but expect some drainage for many weeks.  Replace cotton balls/pads as needed  Wear an absorbent pad or soft cotton balls on the anus as needed to catch any drainage and help keep the area clean and dry.  This is less sticking/painful than regular gauze   01/01/22 0742            Significant Diagnostic Studies:  Results for orders placed or performed during the hospital encounter of 12/31/21 (from the past 72 hour(s))  Glucose, capillary     Status: Abnormal   Collection Time: 12/31/21  9:46 AM  Result Value Ref Range   Glucose-Capillary 207 (H) 70 - 99 mg/dL    Comment: Glucose reference range applies only to samples taken after fasting for at least 8 hours.  Hemoglobin A1c     Status: Abnormal   Collection Time: 12/31/21 10:36 AM  Result Value Ref Range   Hgb A1c MFr Bld 7.2 (H) 4.8 - 5.6 %    Comment: (NOTE) Pre diabetes:          5.7%-6.4%  Diabetes:              >6.4%  Glycemic control for   <7.0% adults with diabetes    Mean Plasma Glucose 159.94 mg/dL    Comment: Performed at [facility] [address] [place], [place] [address]  Glucose, capillary     Status: Abnormal   Collection Time: 12/31/21 12:17 PM  Result Value Ref Range   Glucose-Capillary 151 (H) 70 - 99 mg/dL    Comment: Glucose reference range applies only to samples taken after fasting for at least 8 hours.  Glucose, capillary     Status: Abnormal   Collection Time: 12/31/21  2:45 PM  Result Value Ref Range   Glucose-Capillary 104 (H) 70 - 99 mg/dL    Comment: Glucose reference range applies only to samples taken after fasting for at least 8 hours.  Glucose, capillary     Status: Abnormal   Collection Time: 12/31/21  6:22 PM  Result Value Ref Range    Glucose-Capillary 114 (H) 70 - 99 mg/dL    Comment: Glucose reference range applies only to samples taken after fasting for at least 8 hours.  Basic metabolic panel     Status: Abnormal   Collection Time: 01/01/22  4:58 AM  Result Value Ref Range   Sodium 134 (L) 135 - 145 mmol/L   Potassium 4.6 3.5 - 5.1 mmol/L   Chloride 106 98 - 111 mmol/L   CO2 23 22 - 32 mmol/L   Glucose, Bld 236 (H) 70 - 99 mg/dL    Comment: Glucose reference range applies only to samples taken after fasting for at least 8 hours.   BUN 36 (H) 8 - 23 mg/dL   Creatinine, Ser 1.34 (H) 0.61 - 1.24 mg/dL   Calcium 8.3 (L) 8.9 - 10.3 mg/dL   GFR, Estimated 53 (L) >60 mL/min    Comment: (NOTE) Calculated using the CKD-EPI Creatinine Equation (2021)    Anion gap 5 5 - 15    Comment: Performed at [facility], [place] [address] [place], [place] [address]  CBC     Status: Abnormal   Collection Time: 01/01/22  4:58 AM  Result Value Ref Range   WBC 9.8  4.0 - 10.5 K/uL   RBC 2.95 (L) 4.22 - 5.81 MIL/uL   Hemoglobin 9.3 (L) 13.0 - 17.0 g/dL   HCT 28.3 (L) 39.0 - 52.0 %   MCV 95.9 80.0 - 100.0 fL   MCH 31.5 26.0 - 34.0 pg   MCHC 32.9 30.0 - 36.0 g/dL   RDW 13.8 11.5 - 15.5 %   Platelets 175 150 - 400 K/uL   nRBC 0.0 0.0 - 0.2 %    Comment: Performed at [facility], [place] [address], [place], [place] [address]  Magnesium     Status: Abnormal   Collection Time: 01/01/22  4:58 AM  Result Value Ref Range   Magnesium 1.6 (L) 1.7 - 2.4 mg/dL    Comment: Performed at [facility], [place] [address], [place], [place] [address]    No results found.  Past Medical History:  Diagnosis Date   (HFpEF) heart failure with preserved ejection fraction (HCC)    Anxiety    Aortic stenosis    Arthritis    Asthma    Chronic pain    CKD (chronic kidney disease) stage 3, GFR 30-59 ml/min (HCC)    Coronary atherosclerosis of native coronary artery    Multvessel, DES to LAD and RCA  8/03; EF 65% by echo 11/2014   DM2 (diabetes mellitus, type 2) ([place])    Essential hypertension    Gastric ulcer    Gastritis    Related to NSAIDs   GERD (gastroesophageal reflux disease)    Iron deficiency anemia    Mitral valve regurgitation    Mixed hyperlipidemia    Myocardial infarction [facility]    Nephrolithiasis     Past Surgical History:  Procedure Laterality Date   APPENDECTOMY     BIOPSY  09/25/2021   Procedure: BIOPSY;  Surgeon: Daneil Dolin, MD;  Location: [facility];  Service: Endoscopy;;   CARDIAC CATHETERIZATION  01/2002   90% obstruction in proximal LAD, 60 % obstruction in proximal circumflex and 70%  in proximal RCA. LAD & RCA stented  stents x 2   COLONOSCOPY WITH PROPOFOL N/A 09/25/2021   Procedure: COLONOSCOPY WITH PROPOFOL;  Surgeon: Daneil Dolin, MD;  Location: [facility];  Service: Endoscopy;  Laterality: N/A;  11:00am   L knee replacement  2009   Subsequent infectino requiring resectino arthroplasty with incision and drainage 8/10, and reimplantizathion arthroplasty, 9/20. 5 total surgeries.   PARTIAL PROCTECTOMY BY TEM N/A 12/31/2021   Procedure: TEM PARTIAL PROTECTOMY;  Surgeon: Michael Boston, MD;  Location: [facility];  Service: General;  Laterality: N/A;    Social History   Socioeconomic History   Marital status: Married    Spouse name: Not on file   Number of children: Not on file   Years of education: Not on file   Highest education level: Not on file  Occupational History   Not on file  Tobacco Use   Smoking status: Former    Types: Cigarettes    Passive exposure: Never   Smokeless tobacco: Current    Types: Chew  Vaping Use   Vaping Use: Never used  Substance and Sexual Activity   Alcohol use: No   Drug use: No   Sexual activity: Not Currently  Other Topics Concern   Not on file  Social History Narrative   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         Social Determinants of Health   Financial Resource Strain: Not on file  Food Insecurity:  Not on file  Transportation Needs: Not on file  Physical Activity: Not on file  Stress: Not on file  Social Connections: Not on file  Intimate Partner Violence: Not on file    Family History  Problem Relation Age of Onset   Diabetes Mother    Aneurysm Mother        Brain   Stroke Father    Stroke Other    Colon cancer Neg Hx     Current Facility-Administered Medications  Medication Dose Route Frequency Provider Last Rate Last Admin   0.9 %  sodium chloride infusion  250 mL Intravenous PRN Michael Boston, MD 10 mL/hr at 01/01/22 0507 250 mL at 01/01/22 0507   acetaminophen (TYLENOL) tablet 1,000 mg  1,000 mg Oral Lajuana Ripple, MD   1,000 mg at 01/01/22 0504   alum & mag hydroxide-simeth (MAALOX/MYLANTA) 200-200-20 MG/5ML suspension 30 mL  30 mL Oral Q6H PRN Michael Boston, MD       alvimopan (ENTEREG) capsule 12 mg  12 mg Oral BID Michael Boston, MD       ascorbic acid (VITAMIN C) tablet 500 mg  500 mg Oral Ardeen Fillers, MD   500 mg at 12/31/21 2213   aspirin EC tablet 81 mg  81 mg Oral Daily Michael Boston, MD       carvedilol (COREG) tablet 12.5 mg  12.5 mg Oral BID WC Michael Boston, MD       Chlorhexidine Gluconate Cloth 2 % PADS 6 each  6 each Topical Once Michael Boston, MD       cyanocobalamin (VITAMIN B12) tablet 500 mcg  500 mcg Oral Daily Michael Boston, MD       diphenhydrAMINE (BENADRYL) 12.5 MG/5ML elixir 12.5 mg  12.5 mg Oral Q6H PRN Michael Boston, MD       Or   diphenhydrAMINE (BENADRYL) injection 12.5 mg  12.5 mg Intravenous Q6H PRN Michael Boston, MD       enoxaparin (LOVENOX) injection 40 mg  40 mg Subcutaneous Q24H Michael Boston, MD       feeding supplement (ENSURE SURGERY) liquid 237 mL  237 mL Oral BID BM Michael Boston, MD       ferrous sulfate tablet 325 mg  325 mg Oral BID Michael Boston, MD   325 mg at 12/31/21 2213   hydrALAZINE (APRESOLINE) injection 10 mg  10 mg Intravenous Q2H PRN Michael Boston, MD       hydrocortisone-pramoxine [facility]) 2.5-1 %  rectal cream   Rectal Q6H PRN Michael Boston, MD       HYDROmorphone (DILAUDID) injection 0.5-2 mg  0.5-2 mg Intravenous Q4H PRN Michael Boston, MD       lip balm (CARMEX) ointment   Topical BID Michael Boston, MD   1 Application at 73/42/87 2215   LORazepam (ATIVAN) tablet 1 mg  1 mg Oral Ardeen Fillers, MD   1 mg at 12/31/21 2213   magic mouthwash  15 mL Oral QID PRN Michael Boston, MD       melatonin tablet 3 mg  3 mg Oral QHS PRN Michael Boston, MD   3 mg at 01/01/22 0138   metoprolol tartrate (LOPRESSOR) injection 5 mg  5 mg Intravenous Q6H PRN Michael Boston, MD   5 mg at 12/31/21 2216   nitroGLYCERIN (NITROSTAT) SL tablet 0.4 mg  0.4 mg Sublingual Q5 min PRN Michael Boston, MD       ondansetron [facility]) tablet 4 mg  4 mg Oral Q6H PRN  Michael Boston, MD       Or   ondansetron [facility]) injection 4 mg  4 mg Intravenous Q6H PRN Michael Boston, MD       pioglitazone (ACTOS) tablet 45 mg  45 mg Oral Daily Michael Boston, MD       prochlorperazine (COMPAZINE) tablet 10 mg  10 mg Oral Q6H PRN Michael Boston, MD       Or   prochlorperazine (COMPAZINE) injection 5-10 mg  5-10 mg Intravenous Q6H PRN Michael Boston, MD       rosuvastatin (CRESTOR) tablet 5 mg  5 mg Oral Q Jacqualine Mau, MD   5 mg at 12/31/21 2213   simethicone (MYLICON) chewable tablet 40 mg  40 mg Oral Q6H PRN Michael Boston, MD       sodium chloride flush (NS) 0.9 % injection 3 mL  3 mL Intravenous Gorden Harms, MD   3 mL at 12/31/21 2215   sodium chloride flush (NS) 0.9 % injection 3 mL  3 mL Intravenous PRN Michael Boston, MD       tiZANidine (ZANAFLEX) tablet 2 mg  2 mg Oral BID Michael Boston, MD   2 mg at 12/31/21 2213   traMADol (ULTRAM) tablet 50-100 mg  50-100 mg Oral Q6H PRN Michael Boston, MD       witch hazel-glycerin (TUCKS) pad   Topical PRN Michael Boston, MD         Allergies  Allergen Reactions   Prednisone Other (See Comments)    "makes him feel faint", hyperglycemic     Signed:   Adin Hector, MD, FACS,  MASCRS Esophageal, Gastrointestinal & Colorectal Surgery Robotic and Minimally Invasive Surgery  [facility] [place] [address] [place], [place] [address] [phone] Fax [phone] Main  CONTACT INFORMATION:  Weekday (9AM-5PM): Call [facility] main office at [phone]  Weeknight (5PM-9AM) or Weekend/Holiday: Check [email or web address] (password " [email or web address]") for General Surgery [facility] coverage  (Please, do not use SecureChat as it is not reliable communication to reach operating surgeons for immediate patient care)      01/01/2022, 7:43 AM

## 2022-01-01 NOTE — TOC Transition Note (Signed)
Transition of Care [facility]) - CM/SW Discharge Note   Patient Details  Name: DEONDREA MARKOS MRN: [id number] Date of Birth: May 22, 1940  [facility]) CM/SW Contact:  Lennart Pall, LCSW Phone Number: 01/01/2022, 2:43 PM   Clinical Narrative:    Met with pt who is set for discharge home today.  Pt reports that he is actively followed by [facility] for [facility] and HHPT.  Have spoken with [facility] liaison who states they will reach out to pt's PCP to secure restart orders. No further TOC needs.   Final next level of care: [place] Barriers to Discharge: No Barriers Identified   Patient Goals and CMS Choice Patient states their goals for this hospitalization and ongoing recovery are:: return home      Discharge Placement                       Discharge Plan and Services                DME Arranged: N/A DME Agency: NA       HH Arranged: RN, PT HH Agency: [place] Date [place]: 01/01/22 Time [place] [address]: [id number] Representative spoke with at [place]: [place] ([place]) Interventions     Readmission Risk Interventions    01/01/2022    2:42 PM 01/01/2022    8:55 AM  Readmission Risk Prevention Plan  Transportation Screening Complete Complete  PCP or Specialist Appt within 3-5 Days  Complete  HRI or [place]  Complete  Social Work Consult for [place] Planning/Counseling  Complete  Palliative Care Screening  Not Applicable  Medication Review [employer])  Complete

## 2022-01-01 NOTE — Evaluation (Signed)
Physical Therapy Evaluation Patient Details Name: Bradley Sheppard MRN: [id number] DOB: [birth year] Today's Date: 01/01/2022  History of Present Illness  Bradley Sheppard is a 81 year old male s/p Parital Protectomy 2* large bleeding polyp 12/31/21.  Pt with extensive history of DM 2, HTN, HLD,MALT lymphoma,  Clinical Impression  Pt admitted as above and presenting with functional mobility limitations 2* generalized weakness/deconditioning, poor endurance and ambulatory balance deficits.  Pt reports he had been recently receiving HHPT to address deficits and he would greatly benefit from continuation of same to maximize IND and safety at home.     Recommendations for follow up therapy are one component of a multi-disciplinary discharge planning process, led by the attending physician.  Recommendations may be updated based on patient status, additional functional criteria and insurance authorization.  Follow Up Recommendations Home health PT      Assistance Recommended at Discharge Frequent or constant Supervision/Assistance  Patient can return home with the following  A little help with walking and/or transfers;A little help with bathing/dressing/bathroom;Assistance with cooking/housework;Assist for transportation;Help with stairs or ramp for entrance    Equipment Recommendations None recommended by PT  Recommendations for Other Services       Functional Status Assessment Patient has had a recent decline in their functional status and demonstrates the ability to make significant improvements in function in a reasonable and predictable amount of time.     Precautions / Restrictions Precautions Precautions: Fall Precaution Comments: weak dorsiflexors Restrictions Weight Bearing Restrictions: No      Mobility  Bed Mobility Overal bed mobility: Needs Assistance Bed Mobility: Supine to Sit     Supine to sit: Supervision     General bed mobility comments: use of bedrail and  increased time but no physical assist    Transfers Overall transfer level: Needs assistance Equipment used: Rolling walker (2 wheels) Transfers: Sit to/from Stand Sit to Stand: Min assist, Max assist           General transfer comment: Cues for LE management and use of UEs to self assist.  Physical assist to bring wt up and fwd from EOB and from recliner - less assist from recliner which pt sleeps in at home    Ambulation/Gait Ambulation/Gait assistance: Min assist Gait Distance (Feet): 25 Feet (25' twice) Assistive device: Rolling walker (2 wheels) Gait Pattern/deviations: Step-to pattern, Decreased step length - right, Decreased dorsiflexion - right, Shuffle, Trunk flexed       General Gait Details: cues for posture, pace and position from RW; noted stooped posture, foot shuffle and intermitent toe drag on R  Stairs            Wheelchair Mobility    Modified Rankin (Stroke Patients Only)       Balance Overall balance assessment: Needs assistance Sitting-balance support: No upper extremity supported, Feet supported Sitting balance-Leahy Scale: Good     Standing balance support: Bilateral upper extremity supported, Reliant on assistive device for balance Standing balance-Leahy Scale: Poor                               Pertinent Vitals/Pain Pain Assessment Pain Assessment: No/denies pain    Home Living Family/patient expects to be discharged to:: Private residence Living Arrangements: Spouse/significant other Available Help at Discharge: Available 24 hours/day;Family Type of Home: House Home Access: Stairs to enter   [employer] of Steps: 1   Home Layout: One level Home Equipment: Conservation officer, nature (2  wheels);Rollator (4 wheels);BSC/3in1;Tub bench;Grab bars - toilet;Transport chair Additional Comments: Pt lives with wife with kids living by. Recently pt has been sleeping in recliner chair.    Prior Function Prior Level of Function  : Needs assist;History of Falls (last six months)             Mobility Comments: [place] ambulation with RW with PRN assistance from spouse. ADLs Comments: Assisted for bathing and dressing. Uses depends at home currently.     Hand Dominance   Dominant Hand: Right    Extremity/Trunk Assessment   Upper Extremity Assessment Upper Extremity Assessment: Overall WFL for tasks assessed    Lower Extremity Assessment Lower Extremity Assessment: Defer to PT evaluation RLE Deficits / Details: DF limited to neutral with 3+/5 strength LLE Deficits / Details: DF limited to neutral with 3+/5 strength on testing and noted frequent foot drag with ambulation.  Knee flex limited to ~ 25 degrees related to multiple knee surgeries starting in 2009 with TKR    Cervical / Trunk Assessment Cervical / Trunk Assessment: Kyphotic  Communication   Communication: HOH  Cognition Arousal/Alertness: Awake/alert Behavior During Therapy: WFL for tasks assessed/performed Overall Cognitive Status: Within Functional Limits for tasks assessed                                          General Comments      Exercises     Assessment/Plan    PT Assessment Patient needs continued PT services  PT Problem List Decreased strength;Decreased range of motion;Decreased activity tolerance;Decreased balance;Decreased mobility;Decreased knowledge of use of DME;Decreased safety awareness       PT Treatment Interventions DME instruction;Gait training;Stair training;Functional mobility training;Therapeutic activities;Therapeutic exercise;Patient/family education;Balance training    PT Goals (Current goals can be found in the Care Plan section)  Acute Rehab PT Goals Patient Stated Goal: HOME PT Goal Formulation: With patient Time For Goal Achievement: 01/15/22 Potential to Achieve Goals: Good    Frequency Min 3X/week     Co-evaluation PT/OT/SLP Co-Evaluation/Treatment: Yes Reason for  Co-Treatment: To address functional/ADL transfers;For patient/therapist safety PT goals addressed during session: Mobility/safety with mobility OT goals addressed during session: ADL's and self-care       AM-PAC PT "6 Clicks" Mobility  Outcome Measure Help needed turning from your back to your side while in a flat bed without using bedrails?: None Help needed moving from lying on your back to sitting on the side of a flat bed without using bedrails?: A Little Help needed moving to and from a bed to a chair (including a wheelchair)?: A Lot Help needed standing up from a chair using your arms (e.g., wheelchair or bedside chair)?: A Lot Help needed to walk in hospital room?: A Little Help needed climbing 3-5 steps with a railing? : A Lot 6 Click Score: 16    End of Session Equipment Utilized During Treatment: Gait belt Activity Tolerance: Patient tolerated treatment well Patient left: in chair;with call bell/phone within reach;with chair alarm set Nurse Communication: Mobility status PT Visit Diagnosis: Unsteadiness on feet (R26.81);Muscle weakness (generalized) (M62.81);Difficulty in walking, not elsewhere classified (R26.2)    Time: 0822-0852 PT Time Calculation (min) (ACUTE ONLY): 30 min   Charges:   PT Evaluation $PT Eval Low Complexity: [place] [facility] Pager [phone] Office [phone]   [facility] 01/01/2022, 10:44  AM

## 2022-01-02 DIAGNOSIS — R339 Retention of urine, unspecified: Secondary | ICD-10-CM

## 2022-01-02 DIAGNOSIS — R338 Other retention of urine: Secondary | ICD-10-CM

## 2022-01-02 LAB — GLUCOSE, CAPILLARY
Glucose-Capillary: 93 mg/dL (ref 70–99)
Glucose-Capillary: 62 mg/dL — ABNORMAL LOW (ref 70–99)
Glucose-Capillary: 94 mg/dL (ref 70–99)

## 2022-01-02 LAB — POTASSIUM: Potassium: 4 mmol/L (ref 3.5–5.1)

## 2022-01-02 LAB — HEMOGLOBIN: Hemoglobin: 8.4 g/dL — ABNORMAL LOW (ref 13.0–17.0)

## 2022-01-02 LAB — CREATININE, SERUM
Creatinine, Ser: 1.91 mg/dL — ABNORMAL HIGH (ref 0.61–1.24)
GFR, Estimated: 34 mL/min — ABNORMAL LOW (ref >60)

## 2022-01-02 MED ORDER — SODIUM CHLORIDE 0.9 % IV SOLN
125.0000 mg | Freq: Once | INTRAVENOUS | Status: AC
  Administered 2022-01-02: 125 mg via INTRAVENOUS
  Filled 2022-01-02 (×2): qty 10

## 2022-01-02 MED ORDER — TAMSULOSIN HCL 0.4 MG PO CAPS
0.4000 mg | ORAL_CAPSULE | Freq: Every day | ORAL | 1 refills | Status: DC
Start: 2022-01-02 — End: 2022-04-20

## 2022-01-02 NOTE — Progress Notes (Signed)
Discharge instructions given to patient and all questions were answered.  

## 2022-01-02 NOTE — Progress Notes (Signed)
Patient in & out cathed twice at [place] and 0318 with 0 output.   Bladder scan of over 591cc. Foley placed per previous electronic order from Dr. Johney Maine stating to insert foley if over 400c of urine retained.   Foley Output of 550 at this time. Patient states he feels better already.

## 2022-01-02 NOTE — Progress Notes (Signed)
Patients CBG at 1128 was 62. Patient given 4oz of orange juice and CBG rechecked at 1157 and was 94.

## 2022-01-02 NOTE — Progress Notes (Signed)
Patient and I discussed an attempt to use the bathroom or urinate prior to bed time at 2300. Patient was hesitant about having another In & out cath done. He explained it hurt him when one was done earlier in the afternoon of 8/31.   I told him that we can attempt to stand on the side of the bed. I and NT got him to stand on the side of the bed to urinate in urinal but patient only produced a small stream in his brief and a few drips into the urinal.   Patient also attempted to use urinal in bed just in case, to no avail. I go back in to tell him that I would in and out cath him, and patient refused for the time being, stating he is tired.   Will try again during shift.

## 2022-01-06 ENCOUNTER — Ambulatory Visit: Payer: PPO | Admitting: Cardiology

## 2022-01-06 DIAGNOSIS — Z299 Encounter for prophylactic measures, unspecified: Secondary | ICD-10-CM | POA: Diagnosis not present

## 2022-01-06 DIAGNOSIS — Z09 Encounter for follow-up examination after completed treatment for conditions other than malignant neoplasm: Secondary | ICD-10-CM | POA: Diagnosis not present

## 2022-01-06 DIAGNOSIS — K921 Melena: Secondary | ICD-10-CM | POA: Diagnosis not present

## 2022-01-06 DIAGNOSIS — I10 Essential (primary) hypertension: Secondary | ICD-10-CM | POA: Diagnosis not present

## 2022-01-06 DIAGNOSIS — L89152 Pressure ulcer of sacral region, stage 2: Secondary | ICD-10-CM | POA: Diagnosis not present

## 2022-01-06 LAB — SURGICAL PATHOLOGY

## 2022-01-08 ENCOUNTER — Encounter (HOSPITAL_COMMUNITY): Payer: Self-pay

## 2022-01-08 ENCOUNTER — Other Ambulatory Visit: Payer: Self-pay

## 2022-01-08 ENCOUNTER — Emergency Department (HOSPITAL_COMMUNITY): Payer: PPO

## 2022-01-08 ENCOUNTER — Ambulatory Visit: Payer: PPO | Admitting: Urology

## 2022-01-08 ENCOUNTER — Inpatient Hospital Stay (HOSPITAL_COMMUNITY)
Admission: EM | Admit: 2022-01-08 | Discharge: 2022-01-13 | DRG: 291 | Disposition: A | Discharge status: Home Health | Payer: PPO | Attending: Family Medicine | Admitting: Family Medicine

## 2022-01-08 DIAGNOSIS — Z87891 Personal history of nicotine dependence: Secondary | ICD-10-CM

## 2022-01-08 DIAGNOSIS — E782 Mixed hyperlipidemia: Secondary | ICD-10-CM | POA: Diagnosis present

## 2022-01-08 DIAGNOSIS — L89309 Pressure ulcer of unspecified buttock, unspecified stage: Secondary | ICD-10-CM | POA: Diagnosis not present

## 2022-01-08 DIAGNOSIS — E1122 Type 2 diabetes mellitus with diabetic chronic kidney disease: Secondary | ICD-10-CM | POA: Diagnosis present

## 2022-01-08 DIAGNOSIS — N1832 Chronic kidney disease, stage 3b: Secondary | ICD-10-CM | POA: Diagnosis present

## 2022-01-08 DIAGNOSIS — L8990 Pressure ulcer of unspecified site, unspecified stage: Secondary | ICD-10-CM | POA: Diagnosis present

## 2022-01-08 DIAGNOSIS — D631 Anemia in chronic kidney disease: Secondary | ICD-10-CM | POA: Diagnosis present

## 2022-01-08 DIAGNOSIS — Z7982 Long term (current) use of aspirin: Secondary | ICD-10-CM

## 2022-01-08 DIAGNOSIS — Z743 Need for continuous supervision: Secondary | ICD-10-CM | POA: Diagnosis not present

## 2022-01-08 DIAGNOSIS — E1165 Type 2 diabetes mellitus with hyperglycemia: Secondary | ICD-10-CM | POA: Diagnosis present

## 2022-01-08 DIAGNOSIS — I350 Nonrheumatic aortic (valve) stenosis: Secondary | ICD-10-CM | POA: Diagnosis not present

## 2022-01-08 DIAGNOSIS — I252 Old myocardial infarction: Secondary | ICD-10-CM

## 2022-01-08 DIAGNOSIS — T83511D Infection and inflammatory reaction due to indwelling urethral catheter, subsequent encounter: Secondary | ICD-10-CM | POA: Diagnosis not present

## 2022-01-08 DIAGNOSIS — Z833 Family history of diabetes mellitus: Secondary | ICD-10-CM | POA: Diagnosis not present

## 2022-01-08 DIAGNOSIS — Z955 Presence of coronary angioplasty implant and graft: Secondary | ICD-10-CM | POA: Diagnosis not present

## 2022-01-08 DIAGNOSIS — C8590 Non-Hodgkin lymphoma, unspecified, unspecified site: Secondary | ICD-10-CM | POA: Diagnosis present

## 2022-01-08 DIAGNOSIS — M7989 Other specified soft tissue disorders: Secondary | ICD-10-CM | POA: Diagnosis not present

## 2022-01-08 DIAGNOSIS — R531 Weakness: Secondary | ICD-10-CM | POA: Diagnosis not present

## 2022-01-08 DIAGNOSIS — D62 Acute posthemorrhagic anemia: Secondary | ICD-10-CM | POA: Diagnosis present

## 2022-01-08 DIAGNOSIS — K219 Gastro-esophageal reflux disease without esophagitis: Secondary | ICD-10-CM | POA: Diagnosis present

## 2022-01-08 DIAGNOSIS — I080 Rheumatic disorders of both mitral and aortic valves: Secondary | ICD-10-CM | POA: Diagnosis present

## 2022-01-08 DIAGNOSIS — R339 Retention of urine, unspecified: Secondary | ICD-10-CM | POA: Diagnosis not present

## 2022-01-08 DIAGNOSIS — N139 Obstructive and reflux uropathy, unspecified: Secondary | ICD-10-CM | POA: Diagnosis not present

## 2022-01-08 DIAGNOSIS — Z79899 Other long term (current) drug therapy: Secondary | ICD-10-CM

## 2022-01-08 DIAGNOSIS — C884 Extranodal marginal zone b-cell lymphoma of mucosa-associated lymphoid tissue (malt-lymphoma) not having achieved remission: Secondary | ICD-10-CM | POA: Diagnosis present

## 2022-01-08 DIAGNOSIS — R600 Localized edema: Secondary | ICD-10-CM

## 2022-01-08 DIAGNOSIS — Z823 Family history of stroke: Secondary | ICD-10-CM

## 2022-01-08 DIAGNOSIS — L89312 Pressure ulcer of right buttock, stage 2: Secondary | ICD-10-CM | POA: Diagnosis present

## 2022-01-08 DIAGNOSIS — I130 Hypertensive heart and chronic kidney disease with heart failure and stage 1 through stage 4 chronic kidney disease, or unspecified chronic kidney disease: Secondary | ICD-10-CM | POA: Diagnosis present

## 2022-01-08 DIAGNOSIS — E119 Type 2 diabetes mellitus without complications: Secondary | ICD-10-CM

## 2022-01-08 DIAGNOSIS — N390 Urinary tract infection, site not specified: Secondary | ICD-10-CM | POA: Diagnosis not present

## 2022-01-08 DIAGNOSIS — Z960 Presence of urogenital implants: Secondary | ICD-10-CM | POA: Diagnosis not present

## 2022-01-08 DIAGNOSIS — R0600 Dyspnea, unspecified: Secondary | ICD-10-CM | POA: Diagnosis not present

## 2022-01-08 DIAGNOSIS — I5033 Acute on chronic diastolic (congestive) heart failure: Secondary | ICD-10-CM | POA: Diagnosis present

## 2022-01-08 DIAGNOSIS — Z515 Encounter for palliative care: Secondary | ICD-10-CM | POA: Diagnosis not present

## 2022-01-08 DIAGNOSIS — I10 Essential (primary) hypertension: Secondary | ICD-10-CM | POA: Diagnosis not present

## 2022-01-08 DIAGNOSIS — E11649 Type 2 diabetes mellitus with hypoglycemia without coma: Secondary | ICD-10-CM

## 2022-01-08 DIAGNOSIS — I2510 Atherosclerotic heart disease of native coronary artery without angina pectoris: Secondary | ICD-10-CM | POA: Diagnosis present

## 2022-01-08 DIAGNOSIS — Z7984 Long term (current) use of oral hypoglycemic drugs: Secondary | ICD-10-CM

## 2022-01-08 DIAGNOSIS — M1990 Unspecified osteoarthritis, unspecified site: Secondary | ICD-10-CM | POA: Diagnosis present

## 2022-01-08 DIAGNOSIS — E871 Hypo-osmolality and hyponatremia: Secondary | ICD-10-CM | POA: Diagnosis present

## 2022-01-08 DIAGNOSIS — Z888 Allergy status to other drugs, medicaments and biological substances status: Secondary | ICD-10-CM

## 2022-01-08 DIAGNOSIS — Z7189 Other specified counseling: Secondary | ICD-10-CM | POA: Diagnosis not present

## 2022-01-08 DIAGNOSIS — N179 Acute kidney failure, unspecified: Secondary | ICD-10-CM | POA: Diagnosis present

## 2022-01-08 DIAGNOSIS — R000 Tachycardia, unspecified: Secondary | ICD-10-CM | POA: Diagnosis not present

## 2022-01-08 DIAGNOSIS — J811 Chronic pulmonary edema: Secondary | ICD-10-CM | POA: Diagnosis not present

## 2022-01-08 DIAGNOSIS — R5381 Other malaise: Secondary | ICD-10-CM | POA: Diagnosis not present

## 2022-01-08 DIAGNOSIS — I509 Heart failure, unspecified: Secondary | ICD-10-CM

## 2022-01-08 DIAGNOSIS — R69 Illness, unspecified: Secondary | ICD-10-CM | POA: Diagnosis not present

## 2022-01-08 DIAGNOSIS — R52 Pain, unspecified: Secondary | ICD-10-CM | POA: Diagnosis not present

## 2022-01-08 DIAGNOSIS — I5043 Acute on chronic combined systolic (congestive) and diastolic (congestive) heart failure: Secondary | ICD-10-CM | POA: Diagnosis not present

## 2022-01-08 DIAGNOSIS — Z96652 Presence of left artificial knee joint: Secondary | ICD-10-CM | POA: Diagnosis present

## 2022-01-08 LAB — BASIC METABOLIC PANEL
Anion gap: 8 (ref 5–15)
BUN: 47 mg/dL — ABNORMAL HIGH (ref 8–23)
CO2: 23 mmol/L (ref 22–32)
Calcium: 8.1 mg/dL — ABNORMAL LOW (ref 8.9–10.3)
Chloride: 101 mmol/L (ref 98–111)
Creatinine, Ser: 1.79 mg/dL — ABNORMAL HIGH (ref 0.61–1.24)
GFR, Estimated: 37 mL/min — ABNORMAL LOW (ref >60)
Glucose, Bld: 129 mg/dL — ABNORMAL HIGH (ref 70–99)
Potassium: 4 mmol/L (ref 3.5–5.1)
Sodium: 132 mmol/L — ABNORMAL LOW (ref 135–145)

## 2022-01-08 LAB — URINALYSIS, ROUTINE W REFLEX MICROSCOPIC
Bilirubin Urine: NEGATIVE
Glucose, UA: NEGATIVE mg/dL
Hgb urine dipstick: NEGATIVE
Ketones, ur: NEGATIVE mg/dL
Leukocytes,Ua: NEGATIVE
Nitrite: NEGATIVE
Protein, ur: NEGATIVE mg/dL
Specific Gravity, Urine: 1.011 (ref 1.005–1.030)
pH: 5 (ref 5.0–8.0)

## 2022-01-08 LAB — PREPARE RBC (CROSSMATCH)

## 2022-01-08 LAB — CBC WITH DIFFERENTIAL/PLATELET
Abs Immature Granulocytes: 0.06 10*3/uL (ref 0.00–0.07)
Basophils Absolute: 0 10*3/uL (ref 0.0–0.1)
Basophils Relative: 0 %
Eosinophils Absolute: 0.2 10*3/uL (ref 0.0–0.5)
Eosinophils Relative: 2 %
HCT: 24.5 % — ABNORMAL LOW (ref 39.0–52.0)
Hemoglobin: 8.1 g/dL — ABNORMAL LOW (ref 13.0–17.0)
Immature Granulocytes: 1 %
Lymphocytes Relative: 25 %
Lymphs Abs: 1.9 10*3/uL (ref 0.7–4.0)
MCH: 30.8 pg (ref 26.0–34.0)
MCHC: 33.1 g/dL (ref 30.0–36.0)
MCV: 93.2 fL (ref 80.0–100.0)
Monocytes Absolute: 0.7 10*3/uL (ref 0.1–1.0)
Monocytes Relative: 9 %
Neutro Abs: 4.8 10*3/uL (ref 1.7–7.7)
Neutrophils Relative %: 63 %
Platelets: 285 10*3/uL (ref 150–400)
RBC: 2.63 MIL/uL — ABNORMAL LOW (ref 4.22–5.81)
RDW: 13.8 % (ref 11.5–15.5)
WBC: 7.6 10*3/uL (ref 4.0–10.5)
nRBC: 0 % (ref 0.0–0.2)

## 2022-01-08 LAB — GLUCOSE, CAPILLARY
Glucose-Capillary: 156 mg/dL — ABNORMAL HIGH (ref 70–99)
Glucose-Capillary: 98 mg/dL (ref 70–99)

## 2022-01-08 LAB — BRAIN NATRIURETIC PEPTIDE: B Natriuretic Peptide: 765 pg/mL — ABNORMAL HIGH (ref 0.0–100.0)

## 2022-01-08 MED ORDER — SENNOSIDES-DOCUSATE SODIUM 8.6-50 MG PO TABS
2.0000 | ORAL_TABLET | Freq: Every day | ORAL | Status: DC
  Administered 2022-01-08 – 2022-01-09 (×2): 2 via ORAL
  Filled 2022-01-08 (×2): qty 2

## 2022-01-08 MED ORDER — FUROSEMIDE 10 MG/ML IJ SOLN
40.0000 mg | Freq: Every morning | INTRAMUSCULAR | Status: DC
Start: 2022-01-09 — End: 2022-01-13
  Administered 2022-01-09 – 2022-01-13 (×5): 40 mg via INTRAVENOUS
  Filled 2022-01-08 (×5): qty 4

## 2022-01-08 MED ORDER — HEPARIN SODIUM (PORCINE) 5000 UNIT/ML IJ SOLN
5000.0000 [IU] | Freq: Three times a day (TID) | INTRAMUSCULAR | Status: DC
  Administered 2022-01-08 – 2022-01-09 (×3): 5000 [IU] via SUBCUTANEOUS
  Filled 2022-01-08 (×3): qty 1

## 2022-01-08 MED ORDER — ONDANSETRON HCL 4 MG/2ML IJ SOLN: 4.0000 mg | Freq: Four times a day (QID) | INTRAMUSCULAR | Status: DC | PRN

## 2022-01-08 MED ORDER — SODIUM CHLORIDE 0.9% FLUSH
3.0000 mL | INTRAVENOUS | Status: DC | PRN
Start: 2022-01-08 — End: 2022-01-13

## 2022-01-08 MED ORDER — SODIUM CHLORIDE 0.9% FLUSH
3.0000 mL | Freq: Two times a day (BID) | INTRAVENOUS | Status: DC
  Administered 2022-01-08 – 2022-01-13 (×10): 3 mL via INTRAVENOUS

## 2022-01-08 MED ORDER — LORAZEPAM 0.5 MG PO TABS
0.5000 mg | ORAL_TABLET | Freq: Every day | ORAL | Status: DC
  Administered 2022-01-08 – 2022-01-12 (×5): 0.5 mg via ORAL
  Filled 2022-01-08 (×5): qty 1

## 2022-01-08 MED ORDER — PIOGLITAZONE HCL 15 MG PO TABS
45.0000 mg | ORAL_TABLET | Freq: Every day | ORAL | Status: DC
  Administered 2022-01-09: 45 mg via ORAL
  Filled 2022-01-08 (×4): qty 3

## 2022-01-08 MED ORDER — SODIUM CHLORIDE 0.9% IV SOLUTION: Freq: Once | INTRAVENOUS | Status: DC

## 2022-01-08 MED ORDER — FERROUS SULFATE 325 (65 FE) MG PO TABS
325.0000 mg | ORAL_TABLET | Freq: Two times a day (BID) | ORAL | Status: DC
  Administered 2022-01-08 – 2022-01-13 (×10): 325 mg via ORAL
  Filled 2022-01-08 (×10): qty 1

## 2022-01-08 MED ORDER — INSULIN ASPART 100 UNIT/ML IJ SOLN
0.0000 [IU] | Freq: Three times a day (TID) | INTRAMUSCULAR | Status: DC
  Administered 2022-01-08 – 2022-01-09 (×2): 3 [IU] via SUBCUTANEOUS
  Administered 2022-01-09: 5 [IU] via SUBCUTANEOUS
  Administered 2022-01-10: 8 [IU] via SUBCUTANEOUS
  Administered 2022-01-10 – 2022-01-11 (×3): 3 [IU] via SUBCUTANEOUS
  Administered 2022-01-11: 5 [IU] via SUBCUTANEOUS
  Administered 2022-01-11: 3 [IU] via SUBCUTANEOUS
  Administered 2022-01-12: 8 [IU] via SUBCUTANEOUS
  Administered 2022-01-12: 5 [IU] via SUBCUTANEOUS
  Administered 2022-01-12 – 2022-01-13 (×3): 3 [IU] via SUBCUTANEOUS

## 2022-01-08 MED ORDER — ACETAMINOPHEN 650 MG RE SUPP: 650.0000 mg | Freq: Four times a day (QID) | RECTAL | Status: DC | PRN

## 2022-01-08 MED ORDER — ROSUVASTATIN CALCIUM 10 MG PO TABS: 5.0000 mg | ORAL_TABLET | ORAL | Status: DC

## 2022-01-08 MED ORDER — VITAMIN B-12 100 MCG PO TABS
500.0000 ug | ORAL_TABLET | Freq: Every day | ORAL | Status: DC
  Administered 2022-01-09 – 2022-01-13 (×5): 500 ug via ORAL
  Filled 2022-01-08 (×5): qty 5

## 2022-01-08 MED ORDER — FUROSEMIDE 10 MG/ML IJ SOLN
20.0000 mg | Freq: Every evening | INTRAMUSCULAR | Status: DC
  Administered 2022-01-08 – 2022-01-09 (×2): 20 mg via INTRAVENOUS
  Filled 2022-01-08 (×2): qty 2

## 2022-01-08 MED ORDER — TIZANIDINE HCL 2 MG PO TABS
2.0000 mg | ORAL_TABLET | Freq: Every day | ORAL | Status: DC
  Administered 2022-01-08 – 2022-01-12 (×5): 2 mg via ORAL
  Filled 2022-01-08 (×5): qty 1

## 2022-01-08 MED ORDER — FUROSEMIDE 10 MG/ML IJ SOLN
40.0000 mg | Freq: Once | INTRAMUSCULAR | Status: AC
  Administered 2022-01-08: 40 mg via INTRAVENOUS
  Filled 2022-01-08: qty 4

## 2022-01-08 MED ORDER — TRAZODONE HCL 50 MG PO TABS: 50.0000 mg | ORAL_TABLET | Freq: Every evening | ORAL | Status: DC | PRN

## 2022-01-08 MED ORDER — ACETAMINOPHEN 325 MG PO TABS
650.0000 mg | ORAL_TABLET | Freq: Four times a day (QID) | ORAL | Status: DC | PRN
  Administered 2022-01-09 – 2022-01-10 (×2): 650 mg via ORAL
  Filled 2022-01-08 (×2): qty 2

## 2022-01-08 MED ORDER — POLYETHYLENE GLYCOL 3350 17 G PO PACK
17.0000 g | PACK | Freq: Every day | ORAL | Status: DC | PRN
  Administered 2022-01-08 – 2022-01-09 (×2): 17 g via ORAL
  Filled 2022-01-08 (×2): qty 1

## 2022-01-08 MED ORDER — BISACODYL 10 MG RE SUPP
10.0000 mg | Freq: Every day | RECTAL | Status: DC | PRN
  Administered 2022-01-09: 10 mg via RECTAL
  Filled 2022-01-08: qty 1

## 2022-01-08 MED ORDER — ONDANSETRON HCL 4 MG PO TABS: 4.0000 mg | ORAL_TABLET | Freq: Four times a day (QID) | ORAL | Status: DC | PRN

## 2022-01-08 MED ORDER — OXYCODONE HCL 5 MG PO TABS
2.5000 mg | ORAL_TABLET | Freq: Four times a day (QID) | ORAL | Status: DC | PRN
  Administered 2022-01-09 – 2022-01-13 (×16): 5 mg via ORAL
  Filled 2022-01-08 (×16): qty 1

## 2022-01-08 MED ORDER — FUROSEMIDE 10 MG/ML IJ SOLN
20.0000 mg | Freq: Once | INTRAMUSCULAR | Status: AC
  Administered 2022-01-08: 20 mg via INTRAVENOUS
  Filled 2022-01-08: qty 2

## 2022-01-08 MED ORDER — ASPIRIN 81 MG PO TBEC
81.0000 mg | DELAYED_RELEASE_TABLET | Freq: Every day | ORAL | Status: DC
  Administered 2022-01-09: 81 mg via ORAL
  Filled 2022-01-08: qty 1

## 2022-01-08 MED ORDER — SODIUM CHLORIDE 0.9% FLUSH
3.0000 mL | Freq: Two times a day (BID) | INTRAVENOUS | Status: DC
  Administered 2022-01-09 – 2022-01-12 (×7): 3 mL via INTRAVENOUS

## 2022-01-08 MED ORDER — SODIUM CHLORIDE 0.9 % IV SOLN: INTRAVENOUS | Status: DC | PRN

## 2022-01-08 MED ORDER — ZINC OXIDE 40 % EX PSTE: PASTE | CUTANEOUS | Status: DC | PRN

## 2022-01-08 MED ORDER — INSULIN ASPART 100 UNIT/ML IJ SOLN: 0.0000 [IU] | Freq: Every day | INTRAMUSCULAR | Status: DC

## 2022-01-08 MED ORDER — CARVEDILOL 12.5 MG PO TABS
12.5000 mg | ORAL_TABLET | Freq: Two times a day (BID) | ORAL | Status: DC
  Administered 2022-01-08 – 2022-01-13 (×10): 12.5 mg via ORAL
  Filled 2022-01-08 (×10): qty 1

## 2022-01-08 MED ORDER — VITAMIN C 500 MG PO TABS
500.0000 mg | ORAL_TABLET | Freq: Every day | ORAL | Status: DC
  Administered 2022-01-08 – 2022-01-12 (×5): 500 mg via ORAL
  Filled 2022-01-08 (×5): qty 1

## 2022-01-08 MED ORDER — TAMSULOSIN HCL 0.4 MG PO CAPS
0.4000 mg | ORAL_CAPSULE | Freq: Every day | ORAL | Status: DC
  Administered 2022-01-09 – 2022-01-11 (×3): 0.4 mg via ORAL
  Filled 2022-01-08 (×3): qty 1

## 2022-01-08 NOTE — ED Triage Notes (Signed)
Patient brought in via ems from home. Patient c/o bilat leg weakness with left leg swelling. Has not had lasix today. Patient had polyp removed last week and is also continuing to have rectal bleeding.

## 2022-01-08 NOTE — ED Provider Notes (Signed)
[facility] EMERGENCY DEPARTMENT Provider Note   CSN: [id number] Arrival date & time: 01/08/22  1010     History  Chief Complaint  Patient presents with   Weakness    Bradley Sheppard is a 81 y.o. male.  Patient was just discharged after having a rectal mass resected.  He did have a low hemoglobin and was given iron.  He also had urinary retention and a Foley was placed.  It sounds like he was ambulatory upon discharge although since he has been home he has been getting progressively weaker and having difficulty ambulating and even standing for transfers.  He has a little bit of rectal bleeding and some pain.  No fevers or chills chest pain shortness of breath.  He has been eating and drinking.  He was was to be getting home health but they have not started yet.  Feels his legs are more swollen left greater than right and generally weak.  No falls  The history is provided by the patient.  Weakness Severity:  Moderate Onset quality:  Gradual Duration:  5 days Timing:  Constant Progression:  Worsening Chronicity:  New Relieved by:  Nothing Worsened by:  Activity Ineffective treatments:  None tried Associated symptoms: difficulty walking   Associated symptoms: no abdominal pain, no chest pain, no cough, no diarrhea, no falls, no fever, no nausea, no shortness of breath, no stroke symptoms, no syncope and no vomiting        Home Medications Prior to Admission medications   Medication Sig Start Date End Date Taking? Authorizing Provider  acetaminophen (TYLENOL) 500 MG tablet Take 1,000 mg by mouth every 6 (six) hours as needed for moderate pain.    [provider]  aspirin EC 81 MG tablet Take 81 mg by mouth daily. Swallow whole.    [provider]  carvedilol (COREG) 12.5 MG tablet Take 1 tablet (12.5 mg total) by mouth 2 (two) times daily with a meal. 12/01/21   Tat, Shanon Brow, MD  ferrous sulfate 324 MG TBEC Take 324 mg by mouth 2 (two) times daily.    [provider]  furosemide (LASIX) 40 MG tablet Take 1 tablet (40 mg total) by mouth daily. 12/02/21   Orson Eva, MD  glipiZIDE (GLUCOTROL XL) 10 MG 24 hr tablet Take 10 mg by mouth 2 (two) times daily. 12/19/20   [provider]  LORazepam (ATIVAN) 1 MG tablet Take 1 mg by mouth at bedtime.    [provider]  metFORMIN (GLUCOPHAGE) 500 MG tablet Take 500-1,000 mg by mouth See admin instructions. Take 1000 mg in the morning and 500 mg at night 08/16/21   [provider]  nitroGLYCERIN (NITROSTAT) 0.4 MG SL tablet Place 1 tablet (0.4 mg total) under the tongue every 5 (five) minutes as needed. 06/30/21   Satira Sark, MD  oxyCODONE (OXY IR/ROXICODONE) 5 MG immediate release tablet Take 0.5-1 tablets (2.5-5 mg total) by mouth every 6 (six) hours as needed for moderate pain, severe pain or breakthrough pain. 12/31/21   Senica Crall Boston, MD  pioglitazone (ACTOS) 45 MG tablet Take 45 mg by mouth daily.    [provider]  rosuvastatin (CRESTOR) 5 MG tablet Take 5 mg by mouth every Wednesday. 12/02/21   [provider]  tamsulosin (FLOMAX) 0.4 MG CAPS capsule Take 1 capsule (0.4 mg total) by mouth daily. 01/02/22   Gino Garrabrant Boston, MD  tiZANidine (ZANAFLEX) 2 MG tablet Take 2 mg by mouth 2 (two) times daily.  [provider]  vitamin B-12 (CYANOCOBALAMIN) 500 MCG tablet Take 500 mcg by mouth daily.    [provider]  vitamin C (ASCORBIC ACID) 500 MG tablet Take 500 mg by mouth at bedtime.    [provider]      Allergies    Prednisone    Review of Systems   Review of Systems  Constitutional:  Negative for fever.  Respiratory:  Negative for cough and shortness of breath.   Cardiovascular:  Positive for leg swelling. Negative for chest pain and syncope.  Gastrointestinal:  Negative for abdominal pain, diarrhea, nausea and vomiting.  Musculoskeletal:  Positive for gait problem. Negative for falls.  Neurological:  Positive for  weakness.    Physical Exam Updated Vital Signs BP 123/89   Pulse (!) 42   Temp 98.2 F (36.8 C) (Oral)   Resp (!) 22   SpO2 96%  Physical Exam Vitals and nursing note reviewed.  Constitutional:      General: He is not in acute distress.    Appearance: Normal appearance. He is well-developed.  HENT:     Head: Normocephalic and atraumatic.  Eyes:     Conjunctiva/sclera: Conjunctivae normal.  Cardiovascular:     Rate and Rhythm: Regular rhythm. Bradycardia present.     Heart sounds: No murmur heard. Pulmonary:     Effort: Pulmonary effort is normal. No respiratory distress.     Breath sounds: Normal breath sounds.  Abdominal:     Palpations: Abdomen is soft.     Tenderness: There is no abdominal tenderness. There is no guarding or rebound.  Genitourinary:    Comments: He has a Foley in place.  There are few areas of some superficial skin breakdown around his perianal area.  There is no active bleeding. Musculoskeletal:        General: No tenderness.     Cervical back: Neck supple.     Right lower leg: Edema present.     Left lower leg: Edema present.  Skin:    General: Skin is warm and dry.     Capillary Refill: Capillary refill takes less than 2 seconds.  Neurological:     General: No focal deficit present.     Mental Status: He is alert.     Motor: No weakness.     Comments: He is able to raise both legs off the bed.     ED Results / Procedures / Treatments   Labs (all labs ordered are listed, but only abnormal results are displayed) Labs Reviewed  BASIC METABOLIC PANEL - Abnormal; Notable for the following components:      Result Value   Sodium 132 (*)    Glucose, Bld 129 (*)    BUN 47 (*)    Creatinine, Ser 1.79 (*)    Calcium 8.1 (*)    GFR, Estimated 37 (*)    All other components within normal limits  CBC WITH DIFFERENTIAL/PLATELET - Abnormal; Notable for the following components:   RBC 2.63 (*)    Hemoglobin 8.1 (*)    HCT 24.5 (*)    All other  components within normal limits  BRAIN NATRIURETIC PEPTIDE - Abnormal; Notable for the following components:   B Natriuretic Peptide 765.0 (*)    All other components within normal limits  URINE CULTURE  URINALYSIS, ROUTINE W REFLEX MICROSCOPIC  BASIC METABOLIC PANEL  CBC  TYPE AND SCREEN  PREPARE RBC (CROSSMATCH)    EKG None  Radiology DG Chest [facility] 1 [address]  Result Date: 01/08/2022 CLINICAL DATA:  Weakness, left leg swelling EXAM: PORTABLE CHEST 1 VIEW COMPARISON:  Radiograph 11/27/2021 FINDINGS: Unchanged enlarged cardiac silhouette. Aortic arch calcifications. There are mild diffuse interstitial opacities. There is no pleural effusion. No pneumothorax. Thoracic spondylosis with dextroconvex curvature. Severe bilateral glenohumeral osteoarthritis. IMPRESSION: Cardiomegaly with interstitial pulmonary edema. Electronically Signed   By: Maurine Simmering M.D.   On: 01/08/2022 12:04   US Venous Img Lower Bilateral (DVT)  Result Date: 01/08/2022 CLINICAL DATA:  Bilateral lower extremity edema for 1 month. EXAM: Bilateral LOWER EXTREMITY VENOUS DOPPLER ULTRASOUND TECHNIQUE: Gray-scale sonography with compression, as well as color and duplex ultrasound, were performed to evaluate the deep venous system(s) from the level of the common femoral vein through the popliteal and proximal calf veins. COMPARISON:  No recent comparison imaging. Imaging from 2009 is available. FINDINGS: VENOUS Normal compressibility of the common femoral, superficial femoral, and popliteal veins, as well as the visualized calf veins. Visualized portions of profunda femoral vein and great saphenous vein unremarkable. No filling defects to suggest DVT on grayscale or color Doppler imaging. Doppler waveforms show normal direction of venous flow, normal respiratory plasticity and response to augmentation. OTHER Edema in the lower leg bilaterally. Limitations: Limited assessment of bilateral calf veins due to presence of lower extremity,  lower leg edema. IMPRESSION: Negative for DVT. Mildly limited with respect to calf vein assessment due to lower leg edema. Electronically Signed   By: Zetta Bills M.D.   On: 01/08/2022 11:45    Procedures Procedures    Medications Ordered in ED Medications  furosemide (LASIX) injection 40 mg (has no administration in time range)  furosemide (LASIX) injection 20 mg (has no administration in time range)  insulin aspart (novoLOG) injection 0-15 Units (has no administration in time range)  insulin aspart (novoLOG) injection 0-5 Units (has no administration in time range)  aspirin EC tablet 81 mg (has no administration in time range)  oxyCODONE (Oxy IR/ROXICODONE) immediate release tablet 2.5-5 mg (has no administration in time range)  carvedilol (COREG) tablet 12.5 mg (has no administration in time range)  rosuvastatin (CRESTOR) tablet 5 mg (has no administration in time range)  LORazepam (ATIVAN) tablet 0.5 mg (has no administration in time range)  pioglitazone (ACTOS) tablet 45 mg (has no administration in time range)  tamsulosin (FLOMAX) capsule 0.4 mg (has no administration in time range)  ferrous sulfate tablet 325 mg (has no administration in time range)  vitamin B-12 (CYANOCOBALAMIN) tablet 500 mcg (has no administration in time range)  tiZANidine (ZANAFLEX) tablet 2 mg (has no administration in time range)  ascorbic acid (VITAMIN C) tablet 500 mg (has no administration in time range)  sodium chloride flush (NS) 0.9 % injection 3 mL (has no administration in time range)  sodium chloride flush (NS) 0.9 % injection 3 mL (has no administration in time range)  sodium chloride flush (NS) 0.9 % injection 3 mL (has no administration in time range)  0.9 %  sodium chloride infusion (has no administration in time range)  acetaminophen (TYLENOL) tablet 650 mg (has no administration in time range)    Or  acetaminophen (TYLENOL) suppository 650 mg (has no administration in time range)   traZODone (DESYREL) tablet 50 mg (has no administration in time range)  polyethylene glycol (MIRALAX / GLYCOLAX) packet 17 g (has no administration in time range)  bisacodyl (DULCOLAX) suppository 10 mg (has no administration in time range)  ondansetron (ZOFRAN) tablet 4 mg (has no administration in time range)  Or  ondansetron (ZOFRAN) injection 4 mg (has no administration in time range)  heparin injection 5,000 Units (has no administration in time range)  0.9 %  sodium chloride infusion (Manually program via Guardrails IV Fluids) (has no administration in time range)  furosemide (LASIX) injection 20 mg (has no administration in time range)  senna-docusate (Senokot-S) tablet 2 tablet (has no administration in time range)  furosemide (LASIX) injection 40 mg (40 mg Intravenous Given 01/08/22 1302)    ED Course/ Medical Decision Making/ A&P                           Medical Decision Making Amount and/or Complexity of Data Reviewed Labs: ordered. Radiology: ordered.  Risk Prescription drug management. Decision regarding hospitalization.  This patient complains of generalized weakness leg swelling; this involves an extensive number of treatment Options and is a complaint that carries with it a high risk of complications and morbidity. The differential includes DVT, PE, edema, fluid overload, deconditioning, anemia, metabolic derangement  I ordered, reviewed and interpreted labs, which included CBC with stable low hemoglobin, chemistries with worsening BUN and creatinine low sodium, BNP elevated, urinalysis without signs of infection I ordered medication IV Lasix and reviewed PMP when indicated. I ordered imaging studies which included chest x-ray and venous duplex and I independently    visualized and interpreted imaging which showed no acute DVT. Additional history obtained from patient's wife and daughter Previous records obtained and reviewed in epic including recent discharge  summary I consulted [facility] hospitalist Dr. Denton Brick and discussed lab and imaging findings and discussed disposition.  Cardiac monitoring reviewed, sinus rhythm Social determinants considered, no significant barriers Critical Interventions: None  After the interventions stated above, I reevaluated the patient and found patient still to be symptomatically weak and unsafe for return home Admission and further testing considered, patient will benefit from admission for continued work-up of his weakness.  Patient and family in agreement with plan.          Final Clinical Impression(s) / ED Diagnoses Final diagnoses:  Generalized weakness  Lower extremity edema    Rx / DC Orders ED Discharge Orders     None         Hayden Rasmussen, MD 01/08/22 [phone]

## 2022-01-08 NOTE — Consult Note (Signed)
WOC Nurse Consult Note: Reason for Consult: pressure injuries Patient from home,recent rectal polyp removed. Admitted for weakness Wound type: Stage 2 Pressure Injury: right buttock. 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  Pressure Injury POA: Yes Measurement: see nursing flow sheet Wound CCQ:FJUVQ buttock 100% pink, moist, partial thickness. Shaggy areas over the other buttock and sacrum with superficial skin peeling consistent with moisture exposure Drainage (amount, consistency, odor) none Periwound:intact Dressing procedure/placement/frequency: Silicone foam to the affected areas, if frequently soiled with urine or stool ok to transition to zinc barrier ointment only. Turn and reposition at least every 2 hours. Urine is managed with indwelling foley catheter.    Discussed POC with patient and bedside nurse.  Re consult if needed, will not follow at this time. Thanks  Chelci [place] [employer], RN,CWOCN, CNS, [place] [phone])

## 2022-01-08 NOTE — H&P (Signed)
Patient Demographics:    Bradley Sheppard, is a 81 y.o. male  MRN: [id number]   DOB - 05-19-1940  Admit Date - 01/08/2022  Outpatient Primary MD for the patient is [name], MD   Assessment & Plan:   Assessment and Plan:  1) HFpEF---- acute on chronic diastolic CHF exacerbation---POA -Patient presenting with dyspnea on exertion, orthopnea, weight gain and increasing lower extremity edema --BNP is elevated at 765 it was 177 in July 2023 --Chest x-ray with cardiomegaly and interstitial pulmonary edema -Lower extremity venous Dopplers without DVT -Echo from 11/24/2021 shows LVEF 65 to 70% with moderate diastolic dysfunction, RV contraction normal.   -PTA patient was on oral Lasix -Treat empirically with IV Lasix -Daily weight and fluid input and output monitoring -  2) CAD status post DES to the LAD and RCA in 2003--- no chest pains or ACS type symptoms -His cardiologist previously recommended medical management including  aspirin, Coreg, Crestor, and as needed nitroglycerin.   3)Moderate calcific aortic stenosis with mean gradient 18 mmHg and dimensionless index 0.44.   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 Cordelia Poche bleeding is slowed down according to patient and family -General surgeon told him to expect some degree of slight rectal oozing -Avoid constipation  8)DM2--recent A1c 7.3 reflecting uncontrolled DM with hyperglycemia -Hold glipizide and metformin Use Novolog/Humalog Sliding scale insulin with Accu-Cheks/Fingersticks as ordered   9)Urinary Retention--- Foley was placed on 12/31/2021 perioperatively  -Patient presented this admission with Foley catheter in situ--POA -Continue Flomax and do voiding trial prior to discharge  10) hyponatremia--- sodium is down to 132 suspect due to Lasix use and some hemodilution effect from CHF/volume overload -Monitor closely with IV Lasix  11) generalized weakness and deconditioning----PT OT eval requested -Patient was supposed to have PT OT at home postdischarge from hospital a week ago  12)Decubitus ulcers/pressure injury--POA--- wound care consult requested -Please see photos in epic  Disposition/Need for in-Hospital Stay- patient unable to be discharged at this time due to CHF exacerbation requiring IV diuresis symptomatic anemia requiring transfusion of PRBC  Dispo: The patient is from: Home              Anticipated d/c is to: Home with HH Vs SNF              Anticipated d/c date is: 1 day              Patient currently is not medically stable to d/c. Barriers: Not Clinically Stable-    With History of - Reviewed by me  Past Medical History:  Diagnosis Date   (HFpEF) heart failure with preserved ejection fraction (HCC)    Anxiety    Aortic stenosis     Arthritis    Asthma    Chronic pain    CKD (chronic kidney disease) stage 3, GFR 30-59 ml/min (HCC)    Coronary atherosclerosis of native coronary artery    Multvessel, DES to LAD and RCA 8/03; EF 65% by echo 11/2014   DM2 (diabetes mellitus, type 2) ([place])    Essential hypertension    Gastric ulcer    Gastritis    Related to NSAIDs   GERD (gastroesophageal reflux disease)    Iron deficiency anemia    Mitral valve regurgitation    Mixed hyperlipidemia    Myocardial infarction [facility])    Nephrolithiasis       Past Surgical History:  Procedure Laterality Date   APPENDECTOMY     BIOPSY  09/25/2021   Procedure: BIOPSY;  Surgeon: Daneil Dolin, MD;  Location: [facility];  Service: Endoscopy;;   CARDIAC CATHETERIZATION  01/2002   90% obstruction in proximal LAD, 60 % obstruction in proximal circumflex and 70%  in proximal RCA. LAD & RCA stented  stents x 2   COLONOSCOPY WITH PROPOFOL N/A 09/25/2021   Procedure: COLONOSCOPY WITH PROPOFOL;  Surgeon: Daneil Dolin, MD;  Location: [facility];  Service: Endoscopy;  Laterality: N/A;  11:00am   L knee replacement  2009   Subsequent infectino requiring resectino arthroplasty with incision and drainage 8/10, and reimplantizathion arthroplasty, 9/20. 5 total surgeries.   PARTIAL PROCTECTOMY BY TEM N/A 12/31/2021   Procedure: TEM PARTIAL PROTECTOMY;  Surgeon: Michael Boston, MD;  Location: [facility];  Service: General;  Laterality: N/A;    Chief Complaint  Patient presents with   Weakness      HPI:    Bradley Sheppard  is a 81 y.o. 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 Patient reports increasing lower  extremity edema especially the left lower extremity.... No pleuritic type symptoms, no significant calf pain, -No chest pains no palpitations -Patient gives history of intermittent dizziness especially when he stands up -He has been unable to lay flat sleeping in the recliner and hospital bed -Additional history obtained from patient's wife and daughter at bedside No fever  Or chills  -Has a chronic indwelling Foley catheter but no dysuria -Chest x-ray with cardiomegaly and interstitial pulmonary edema -Lower extremity venous Dopplers without DVT -UA is not suggestive of UTI -BNP is elevated at 765 it was 177 in July 2023 -Hemoglobin is down to 8.1 it was 9.8 on 12/09/2021 -Platelets 285 -Sodium is 132 potassium is 4.0, creatinine is 1.79 baseline usually around 1.3-1.4   Review of  systems:    In addition to the HPI above,   A full Review of  Systems was done, all other systems reviewed are negative except as noted above in HPI , .    Social History:  Reviewed by me    Social History   Tobacco Use   Smoking status: Former    Types: Cigarettes    Passive exposure: Never   Smokeless tobacco: Current    Types: Chew  Substance Use Topics   Alcohol use: No       Family History :  Reviewed by me    Family History  Problem Relation Age of Onset   Diabetes Mother    Aneurysm Mother        Brain   Stroke Father    Stroke Other    Colon cancer Neg Hx      Home Medications:   Prior to Admission medications   Medication Sig Start Date End Date Taking? Authorizing Provider  acetaminophen (TYLENOL) 500 MG tablet Take 1,000 mg by mouth every 6 (six) hours as needed for moderate pain.   Yes [provider]  aspirin EC 81 MG tablet Take 81 mg by mouth daily. Swallow whole.   Yes [provider]  carvedilol (COREG) 12.5 MG tablet Take 1 tablet (12.5 mg total) by mouth 2 (two) times daily with a meal. 12/01/21  Yes Tat, David, MD  ferrous sulfate 324 MG TBEC  Take 324 mg by mouth 2 (two) times daily.   Yes [provider]  furosemide (LASIX) 40 MG tablet Take 1 tablet (40 mg total) by mouth daily. 12/02/21  Yes Tat, Shanon Brow, MD  glipiZIDE (GLUCOTROL XL) 10 MG 24 hr tablet Take 10 mg by mouth 2 (two) times daily. 12/19/20  Yes [provider]  LORazepam (ATIVAN) 1 MG tablet Take 1 mg by mouth at bedtime.   Yes [provider]  metFORMIN (GLUCOPHAGE) 500 MG tablet Take 500-1,000 mg by mouth See admin instructions. Take 1000 mg in the morning and 500 mg at night 08/16/21  Yes [provider]  oxyCODONE (OXY IR/ROXICODONE) 5 MG immediate release tablet Take 0.5-1 tablets (2.5-5 mg total) by mouth every 6 (six) hours as needed for moderate pain, severe pain or breakthrough pain. 12/31/21  Yes Michael Boston, MD  pioglitazone (ACTOS) 45 MG tablet Take 45 mg by mouth daily.   Yes [provider]  rosuvastatin (CRESTOR) 5 MG tablet Take 5 mg by mouth every Wednesday. 12/02/21  Yes [provider]  tamsulosin (FLOMAX) 0.4 MG CAPS capsule Take 1 capsule (0.4 mg total) by mouth daily. 01/02/22  Yes Michael Boston, MD  tiZANidine (ZANAFLEX) 2 MG tablet Take 2 mg by mouth 2 (two) times daily.   Yes [provider]  vitamin B-12 (CYANOCOBALAMIN) 500 MCG tablet Take 500 mcg by mouth daily.   Yes [provider]  vitamin C (ASCORBIC ACID) 500 MG tablet Take 500 mg by mouth at bedtime.   Yes [provider]  nitroGLYCERIN (NITROSTAT) 0.4 MG SL tablet Place 1 tablet (0.4 mg total) under the tongue every 5 (five) minutes as needed. 06/30/21   Satira Sark, MD     Allergies:     Allergies  Allergen Reactions   Prednisone Other (See Comments)    "makes him feel faint", hyperglycemic      Physical Exam:   Vitals  Blood pressure (!) 155/85, pulse 95, temperature 97.8 F (36.6 C), temperature source Oral, resp. rate 18, SpO2  99 %.  Physical Examination: General appearance - alert,  in no  distress Mental status - alert, oriented to person, place, and time,  Eyes - sclera anicteric Neck - supple, no JVD elevation , Chest -diminished in bases with rales  heart - S1 and S2 normal, regular, 3/6/sm Abdomen - soft, nontender, nondistended, +BS Neurological - screening mental status exam normal, neck supple without rigidity, cranial nerves II through XII intact, DTR's normal and symmetric Extremities -+3 pedal edema noted, Lt > Rt, intact peripheral pulses  Skin -sacral and buttock decubitus ulcers     Data Review:    CBC Recent Labs  Lab 01/02/22 0504 01/08/22 1105  WBC  --  7.6  HGB 8.4* 8.1*  HCT  --  24.5*  PLT  --  285  MCV  --  93.2  MCH  --  30.8  MCHC  --  33.1  RDW  --  13.8  LYMPHSABS  --  1.9  MONOABS  --  0.7  EOSABS  --  0.2  BASOSABS  --  0.0   ------------------------------------------------------------------------------------------------------------------  Chemistries  Recent Labs  Lab 01/02/22 0504 01/08/22 1105  NA  --  132*  K 4.0 4.0  CL  --  101  CO2  --  23  GLUCOSE  --  129*  BUN  --  47*  CREATININE 1.91* 1.79*  CALCIUM  --  8.1*   ------------------------------------------------------------------------------------------------------------------ estimated creatinine clearance is 34.3 mL/min (A) (by C-G formula based on SCr of 1.79 mg/dL (H)). ------------------------------------------------------------------------------------------------------------------ No results for input(s): "TSH", "T4TOTAL", "T3FREE", "THYROIDAB" in the last 72 hours.  Invalid input(s): "FREET3"   Coagulation profile No results for input(s): "INR", "PROTIME" in the last 168 hours. ------------------------------------------------------------------------------------------------------------------- No results for input(s): "DDIMER" in the last 72  hours. -------------------------------------------------------------------------------------------------------------------  Cardiac Enzymes No results for input(s): "CKMB", "TROPONINI", "MYOGLOBIN" in the last 168 hours.  Invalid input(s): "CK" ------------------------------------------------------------------------------------------------------------------    Component Value Date/Time   BNP 765.0 (H) 01/08/2022 1105     ---------------------------------------------------------------------------------------------------------------  Urinalysis    Component Value Date/Time   COLORURINE YELLOW 01/08/2022 1107   APPEARANCEUR CLEAR 01/08/2022 1107   LABSPEC 1.011 01/08/2022 1107   PHURINE 5.0 01/08/2022 1107   GLUCOSEU NEGATIVE 01/08/2022 1107   HGBUR NEGATIVE 01/08/2022 1107   BILIRUBINUR NEGATIVE 01/08/2022 1107   KETONESUR NEGATIVE 01/08/2022 1107   PROTEINUR NEGATIVE 01/08/2022 1107   UROBILINOGEN 0.2 12/26/2008 0950   NITRITE NEGATIVE 01/08/2022 1107   LEUKOCYTESUR NEGATIVE 01/08/2022 1107    ----------------------------------------------------------------------------------------------------------------   Imaging Results:    DG Chest Port 1 View  Result Date: 01/08/2022 CLINICAL DATA:  Weakness, left leg swelling EXAM: PORTABLE CHEST 1 VIEW COMPARISON:  Radiograph 11/27/2021 FINDINGS: Unchanged enlarged cardiac silhouette. Aortic arch calcifications. There are mild diffuse interstitial opacities. There is no pleural effusion. No pneumothorax. Thoracic spondylosis with dextroconvex curvature. Severe bilateral glenohumeral osteoarthritis. IMPRESSION: Cardiomegaly with interstitial pulmonary edema. Electronically Signed   By: Maurine Simmering M.D.   On: 01/08/2022 12:04   US Venous Img Lower Bilateral (DVT)  Result Date: 01/08/2022 CLINICAL DATA:  Bilateral lower extremity edema for 1 month. EXAM: Bilateral LOWER EXTREMITY VENOUS DOPPLER ULTRASOUND TECHNIQUE: Gray-scale sonography  with compression, as well as color and duplex ultrasound, were performed to evaluate the deep venous system(s) from the level of the common femoral vein through the popliteal and proximal calf veins. COMPARISON:  No recent comparison imaging. Imaging from 2009 is available. FINDINGS: VENOUS Normal compressibility of the common femoral, superficial femoral, and popliteal  veins, as well as the visualized calf veins. Visualized portions of profunda femoral vein and great saphenous vein unremarkable. No filling defects to suggest DVT on grayscale or color Doppler imaging. Doppler waveforms show normal direction of venous flow, normal respiratory plasticity and response to augmentation. OTHER Edema in the lower leg bilaterally. Limitations: Limited assessment of bilateral calf veins due to presence of lower extremity, lower leg edema. IMPRESSION: Negative for DVT. Mildly limited with respect to calf vein assessment due to lower leg edema. Electronically Signed   By: Zetta Bills M.D.   On: 01/08/2022 11:45    Radiological Exams on Admission: DG Chest Port 1 View  Result Date: 01/08/2022 CLINICAL DATA:  Weakness, left leg swelling EXAM: PORTABLE CHEST 1 VIEW COMPARISON:  Radiograph 11/27/2021 FINDINGS: Unchanged enlarged cardiac silhouette. Aortic arch calcifications. There are mild diffuse interstitial opacities. There is no pleural effusion. No pneumothorax. Thoracic spondylosis with dextroconvex curvature. Severe bilateral glenohumeral osteoarthritis. IMPRESSION: Cardiomegaly with interstitial pulmonary edema. Electronically Signed   By: Maurine Simmering M.D.   On: 01/08/2022 12:04   US Venous Img Lower Bilateral (DVT)  Result Date: 01/08/2022 CLINICAL DATA:  Bilateral lower extremity edema for 1 month. EXAM: Bilateral LOWER EXTREMITY VENOUS DOPPLER ULTRASOUND TECHNIQUE: Gray-scale sonography with compression, as well as color and duplex ultrasound, were performed to evaluate the deep venous system(s) from the  level of the common femoral vein through the popliteal and proximal calf veins. COMPARISON:  No recent comparison imaging. Imaging from 2009 is available. FINDINGS: VENOUS Normal compressibility of the common femoral, superficial femoral, and popliteal veins, as well as the visualized calf veins. Visualized portions of profunda femoral vein and great saphenous vein unremarkable. No filling defects to suggest DVT on grayscale or color Doppler imaging. Doppler waveforms show normal direction of venous flow, normal respiratory plasticity and response to augmentation. OTHER Edema in the lower leg bilaterally. Limitations: Limited assessment of bilateral calf veins due to presence of lower extremity, lower leg edema. IMPRESSION: Negative for DVT. Mildly limited with respect to calf vein assessment due to lower leg edema. Electronically Signed   By: Zetta Bills M.D.   On: 01/08/2022 11:45    DVT Prophylaxis -SCD  /heparin AM Labs Ordered, also please review Full Orders  Family Communication: Admission, patients condition and plan of care including tests being ordered have been discussed with the patient and daughter and wife at bedside who indicate understanding and agree with the plan   Condition   stable  Roxan Hockey M.D on 01/08/2022 at 5:25 PM Go to [email or web address] -  for contact info  [facility] - Office  [phone]

## 2022-01-09 DIAGNOSIS — I350 Nonrheumatic aortic (valve) stenosis: Secondary | ICD-10-CM | POA: Diagnosis not present

## 2022-01-09 DIAGNOSIS — I130 Hypertensive heart and chronic kidney disease with heart failure and stage 1 through stage 4 chronic kidney disease, or unspecified chronic kidney disease: Secondary | ICD-10-CM | POA: Diagnosis present

## 2022-01-09 DIAGNOSIS — Z79899 Other long term (current) drug therapy: Secondary | ICD-10-CM | POA: Diagnosis not present

## 2022-01-09 DIAGNOSIS — I5033 Acute on chronic diastolic (congestive) heart failure: Secondary | ICD-10-CM | POA: Diagnosis present

## 2022-01-09 DIAGNOSIS — E1122 Type 2 diabetes mellitus with diabetic chronic kidney disease: Secondary | ICD-10-CM | POA: Diagnosis present

## 2022-01-09 DIAGNOSIS — Z87891 Personal history of nicotine dependence: Secondary | ICD-10-CM | POA: Diagnosis not present

## 2022-01-09 DIAGNOSIS — N179 Acute kidney failure, unspecified: Secondary | ICD-10-CM | POA: Diagnosis present

## 2022-01-09 DIAGNOSIS — Z515 Encounter for palliative care: Secondary | ICD-10-CM | POA: Diagnosis not present

## 2022-01-09 DIAGNOSIS — Z7189 Other specified counseling: Secondary | ICD-10-CM | POA: Diagnosis not present

## 2022-01-09 DIAGNOSIS — D62 Acute posthemorrhagic anemia: Secondary | ICD-10-CM | POA: Diagnosis present

## 2022-01-09 DIAGNOSIS — E782 Mixed hyperlipidemia: Secondary | ICD-10-CM | POA: Diagnosis present

## 2022-01-09 DIAGNOSIS — L89312 Pressure ulcer of right buttock, stage 2: Secondary | ICD-10-CM | POA: Diagnosis present

## 2022-01-09 DIAGNOSIS — Z7984 Long term (current) use of oral hypoglycemic drugs: Secondary | ICD-10-CM | POA: Diagnosis not present

## 2022-01-09 DIAGNOSIS — C884 Extranodal marginal zone B-cell lymphoma of mucosa-associated lymphoid tissue [MALT-lymphoma]: Secondary | ICD-10-CM | POA: Diagnosis present

## 2022-01-09 DIAGNOSIS — Z833 Family history of diabetes mellitus: Secondary | ICD-10-CM | POA: Diagnosis not present

## 2022-01-09 DIAGNOSIS — D631 Anemia in chronic kidney disease: Secondary | ICD-10-CM | POA: Diagnosis present

## 2022-01-09 DIAGNOSIS — E871 Hypo-osmolality and hyponatremia: Secondary | ICD-10-CM | POA: Diagnosis present

## 2022-01-09 DIAGNOSIS — Z7982 Long term (current) use of aspirin: Secondary | ICD-10-CM | POA: Diagnosis not present

## 2022-01-09 DIAGNOSIS — I2510 Atherosclerotic heart disease of native coronary artery without angina pectoris: Secondary | ICD-10-CM | POA: Diagnosis present

## 2022-01-09 DIAGNOSIS — C8590 Non-Hodgkin lymphoma, unspecified, unspecified site: Secondary | ICD-10-CM | POA: Diagnosis present

## 2022-01-09 DIAGNOSIS — Z955 Presence of coronary angioplasty implant and graft: Secondary | ICD-10-CM | POA: Diagnosis not present

## 2022-01-09 DIAGNOSIS — N1832 Chronic kidney disease, stage 3b: Secondary | ICD-10-CM | POA: Diagnosis present

## 2022-01-09 DIAGNOSIS — I080 Rheumatic disorders of both mitral and aortic valves: Secondary | ICD-10-CM | POA: Diagnosis present

## 2022-01-09 DIAGNOSIS — L89309 Pressure ulcer of unspecified buttock, unspecified stage: Secondary | ICD-10-CM | POA: Diagnosis not present

## 2022-01-09 DIAGNOSIS — E1165 Type 2 diabetes mellitus with hyperglycemia: Secondary | ICD-10-CM | POA: Diagnosis present

## 2022-01-09 DIAGNOSIS — I252 Old myocardial infarction: Secondary | ICD-10-CM | POA: Diagnosis not present

## 2022-01-09 DIAGNOSIS — Z888 Allergy status to other drugs, medicaments and biological substances status: Secondary | ICD-10-CM | POA: Diagnosis not present

## 2022-01-09 DIAGNOSIS — K219 Gastro-esophageal reflux disease without esophagitis: Secondary | ICD-10-CM | POA: Diagnosis present

## 2022-01-09 LAB — CBC
HCT: 26.1 % — ABNORMAL LOW (ref 39.0–52.0)
Hemoglobin: 8.9 g/dL — ABNORMAL LOW (ref 13.0–17.0)
MCH: 31.2 pg (ref 26.0–34.0)
MCHC: 34.1 g/dL (ref 30.0–36.0)
MCV: 91.6 fL (ref 80.0–100.0)
Platelets: 325 10*3/uL (ref 150–400)
RBC: 2.85 MIL/uL — ABNORMAL LOW (ref 4.22–5.81)
RDW: 14.2 % (ref 11.5–15.5)
WBC: 5.9 10*3/uL (ref 4.0–10.5)
nRBC: 0 % (ref 0.0–0.2)

## 2022-01-09 LAB — BASIC METABOLIC PANEL
Anion gap: 8 (ref 5–15)
BUN: 43 mg/dL — ABNORMAL HIGH (ref 8–23)
CO2: 24 mmol/L (ref 22–32)
Calcium: 8.4 mg/dL — ABNORMAL LOW (ref 8.9–10.3)
Chloride: 105 mmol/L (ref 98–111)
Creatinine, Ser: 1.59 mg/dL — ABNORMAL HIGH (ref 0.61–1.24)
GFR, Estimated: 43 mL/min — ABNORMAL LOW (ref >60)
Glucose, Bld: 80 mg/dL (ref 70–99)
Potassium: 3.5 mmol/L (ref 3.5–5.1)
Sodium: 137 mmol/L (ref 135–145)

## 2022-01-09 LAB — GLUCOSE, CAPILLARY
Glucose-Capillary: 100 mg/dL — ABNORMAL HIGH (ref 70–99)
Glucose-Capillary: 234 mg/dL — ABNORMAL HIGH (ref 70–99)
Glucose-Capillary: 134 mg/dL — ABNORMAL HIGH (ref 70–99)
Glucose-Capillary: 159 mg/dL — ABNORMAL HIGH (ref 70–99)

## 2022-01-09 LAB — TYPE AND SCREEN
ABO/RH(D): AB NEG
Antibody Screen: NEGATIVE
Unit division: 0

## 2022-01-09 LAB — URINE CULTURE: Culture: NO GROWTH

## 2022-01-09 LAB — BPAM RBC
Blood Product Expiration Date: 202309292359
ISSUE DATE / TIME: 202309071459
Unit Type and Rh: 600

## 2022-01-09 MED ORDER — POTASSIUM CHLORIDE CRYS ER 20 MEQ PO TBCR
40.0000 meq | EXTENDED_RELEASE_TABLET | Freq: Once | ORAL | Status: AC
Start: 2022-01-09 — End: 2022-01-09
  Administered 2022-01-09: 40 meq via ORAL
  Filled 2022-01-09: qty 2

## 2022-01-09 MED ORDER — LIVING BETTER WITH HEART FAILURE BOOK: Freq: Once | Status: AC

## 2022-01-09 MED ORDER — CHLORHEXIDINE GLUCONATE CLOTH 2 % EX PADS
6.0000 | MEDICATED_PAD | Freq: Every day | CUTANEOUS | Status: DC
  Administered 2022-01-09 – 2022-01-13 (×5): 6 via TOPICAL

## 2022-01-09 NOTE — Progress Notes (Signed)
   01/09/22 0745  Precautions / Armbands  Precautions Fall risk  Patient armbands applied: Patient Identification (White)  Risk for Aspiration  Aspiration Assessment - High Risk None of the above  Aspiration Assessment - At Risk None of the above - No interventions needed  Adult Fall Risk Assessment  Risk Factor Category (scoring not indicated) High fall risk per protocol (document High fall risk)  Patient Fall Risk Level High fall risk  Adult Fall Risk Interventions  Required Bundle Interventions *See Row Information* High fall risk - low, moderate, and high requirements implemented  Additional Interventions Use of appropriate toileting equipment (bedpan, BSC, etc.)  Screening for Fall Injury Risk (To be completed on HIGH fall risk patients) - Assessing Need for Floor Mats  Risk For Fall Injury- Criteria for Floor Mats None identified - No additional interventions needed  Safe Patient Handling Assessment  Ambulates independently No  Pt pulls to standing position & wt <120kg (265lbs) Yes  Safe Patient Handling Equipment One assist recommended  Safety Interventions  Less Restrictive Interventions Active listening;Provide reassurance;Bed alarm  Broset Violence Checklist (Document every shift. If negative for 72 hrs, no longer required. Reassess if new symptoms are present)  Confusion 0  Irritable 0  Boisterous 0  Physical threats 0  Verbal threats 0  Attacking objects 0  Total Score 0  Violence Risk Category Small Risk  Violence Prevention Guidelines *See Row Information* Small Violence Risk interventions implemented  Violence Risk Additional Interventions Supportive listening  Safe Environment  Patient oriented to unit and equipment Yes

## 2022-01-09 NOTE — Evaluation (Signed)
Physical Therapy Evaluation Patient Details Name: Bradley Sheppard MRN: [id number] DOB: 1941/04/30 Today's Date: 01/09/2022  History of Present Illness  Bradley Sheppard  is a 81 y.o. 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 Patient reports increasing lower extremity edema especially the left lower extremity.... No pleuritic type symptoms, no significant calf pain, -No chest pains no palpitations   Clinical Impression  Patient limited for functional mobility as stated below secondary to BLE weakness, fatigue and poor standing balance. Patient requires assist for LE mobility and to pull to seated EOB due to weakness. He demonstrates good sitting balance and tolerance at EOB. He requires mod assist and use of RW to transfer to standing with very limited standing tolerance the first attempt. He then transfers to standing again with assist and is able to ambulate a few labored steps at bedside to chair. He was fatigued following and ends session seated in chair with pillows positioned for offloading buttock. Nursing was notified that patient needed cleaned up. Patient will benefit from continued physical therapy in hospital and recommended venue below to increase strength, balance, endurance for safe ADLs and gait.        Recommendations for follow up therapy are one component of a multi-disciplinary discharge planning process, led by the attending physician.  Recommendations may be updated based on patient status, additional functional criteria and insurance authorization.  Follow Up Recommendations Skilled nursing-short term rehab (<3 hours/day) Can patient physically be  transported by private vehicle: No    Assistance Recommended at Discharge Frequent or constant Supervision/Assistance  Patient can return home with the following  Assistance with cooking/housework;Assist for transportation;Help with stairs or ramp for entrance;A lot of help with walking and/or transfers;A lot of help with bathing/dressing/bathroom    Equipment Recommendations None recommended by PT  Recommendations for Other Services       Functional Status Assessment Patient has had a recent decline in their functional status and demonstrates the ability to make significant improvements in function in a reasonable and predictable amount of time.     Precautions / Restrictions Precautions Precautions: Fall Restrictions Weight Bearing Restrictions: No      Mobility  Bed Mobility Overal bed mobility: Needs Assistance Bed Mobility: Supine to Sit     Supine to sit: Min assist, Mod assist     General bed mobility comments: requires assist for LE mobility, attempts to use bed rails with increased time but requires min/mod assist to pull to seated    Transfers Overall transfer level: Needs assistance Equipment used: Rolling walker (2 wheels) Transfers: Sit to/from Stand Sit to Stand: Mod assist, From elevated surface           General transfer comment: requires assist to power up to standing 2x with RW, very limited standing tolerance first attempt    Ambulation/Gait Ambulation/Gait assistance: Mod assist Gait Distance (Feet): 3 Feet Assistive device: Rolling walker (2 wheels) Gait Pattern/deviations: Shuffle, Trunk flexed Gait velocity: decreased     General Gait Details: slow, labored, shuffled steps at bedside to ambulate to chair  Stairs            Wheelchair Mobility    Modified Rankin (Stroke Patients Only)  Balance Overall balance assessment: Needs assistance Sitting-balance support: No upper extremity supported, Feet supported Sitting  balance-Leahy Scale: Good     Standing balance support: Reliant on assistive device for balance, Bilateral upper extremity supported Standing balance-Leahy Scale: Poor                               Pertinent Vitals/Pain Pain Assessment Pain Assessment: Faces Faces Pain Scale: Hurts little more Pain Location: buttock Pain Descriptors / Indicators: Sore Pain Intervention(s): Limited activity within patient's tolerance, Monitored during session, Repositioned    Home Living Family/patient expects to be discharged to:: Private residence Living Arrangements: Spouse/significant other Available Help at Discharge: Available 24 hours/day;Family Type of Home: House Home Access: Stairs to enter   [employer] of Steps: 1   Home Layout: One level Home Equipment: Conservation officer, nature (2 wheels);Rollator (4 wheels);BSC/3in1;Tub bench;Grab bars - toilet;Transport chair Additional Comments: Pt lives with wife with kids living by. Recently pt has been sleeping in recliner chair.    Prior Function Prior Level of Function : Needs assist;History of Falls (last six months)             Mobility Comments: [place] ambulation with RW with PRN assistance from spouse. ADLs Comments: Assisted for bathing and dressing. Uses depends at home currently.     Hand Dominance   Dominant Hand: Right    Extremity/Trunk Assessment   Upper Extremity Assessment Upper Extremity Assessment: Generalized weakness    Lower Extremity Assessment Lower Extremity Assessment: Generalized weakness    Cervical / Trunk Assessment Cervical / Trunk Assessment: Kyphotic  Communication   Communication: HOH  Cognition Arousal/Alertness: Awake/alert Behavior During Therapy: WFL for tasks assessed/performed Overall Cognitive Status: Within Functional Limits for tasks assessed                                          General Comments      Exercises     Assessment/Plan    PT  Assessment Patient needs continued PT services  PT Problem List Decreased strength;Decreased range of motion;Decreased activity tolerance;Decreased balance;Decreased mobility;Decreased knowledge of use of DME;Decreased safety awareness       PT Treatment Interventions DME instruction;Gait training;Stair training;Functional mobility training;Therapeutic activities;Therapeutic exercise;Patient/family education;Balance training    PT Goals (Current goals can be found in the Care Plan section)  Acute Rehab PT Goals Patient Stated Goal: return home PT Goal Formulation: With patient Time For Goal Achievement: 01/23/22 Potential to Achieve Goals: Good    Frequency Min 3X/week     Co-evaluation               AM-PAC PT "6 Clicks" Mobility  Outcome Measure Help needed turning from your back to your side while in a flat bed without using bedrails?: A Little Help needed moving from lying on your back to sitting on the side of a flat bed without using bedrails?: A Little Help needed moving to and from a bed to a chair (including a wheelchair)?: A Lot Help needed standing up from a chair using your arms (e.g., wheelchair or bedside chair)?: A Lot Help needed to walk in hospital room?: A Lot Help needed climbing 3-5 steps with a railing? : A Lot 6 Click Score: 14    End of Session Equipment Utilized During Treatment: Gait belt Activity Tolerance: Patient limited by fatigue Patient  left: in chair;with call bell/phone within reach Nurse Communication: Mobility status PT Visit Diagnosis: Unsteadiness on feet (R26.81);Muscle weakness (generalized) (M62.81);Difficulty in walking, not elsewhere classified (R26.2);Other abnormalities of gait and mobility (R26.89)    Time: 6728-9791 PT Time Calculation (min) (ACUTE ONLY): 24 min   Charges:   PT Evaluation $PT Eval Low Complexity: 1 Low PT Treatments $Therapeutic Activity: 8-22 mins        11:20 AM, 01/09/22 Mearl Latin PT,  DPT Physical Therapist at [facility]

## 2022-01-09 NOTE — Plan of Care (Signed)
  Problem: Acute Rehab PT Goals(only PT should resolve) Goal: Pt Will Go Supine/Side To Sit Outcome: Progressing Flowsheets (Taken 01/09/2022 1121) Pt will go Supine/Side to Sit: with min guard assist Goal: Pt Will Go Sit To Supine/Side Outcome: Progressing Flowsheets (Taken 01/09/2022 1121) Pt will go Sit to Supine/Side: with min guard assist Goal: Patient Will Transfer Sit To/From Stand Outcome: Progressing Flowsheets (Taken 01/09/2022 1121) Patient will transfer sit to/from stand:  with min guard assist  with minimal assist Goal: Pt Will Transfer Bed To Chair/Chair To Bed Outcome: Progressing Flowsheets (Taken 01/09/2022 1121) Pt will Transfer Bed to Chair/Chair to Bed:  min guard assist  with min assist Goal: Pt Will Ambulate Outcome: Progressing Flowsheets (Taken 01/09/2022 1121) Pt will Ambulate:  25 feet  with min guard assist  with minimal assist  with rolling walker Goal: Pt/caregiver will Perform Home Exercise Program Outcome: Progressing Flowsheets (Taken 01/09/2022 1121) Pt/caregiver will Perform Home Exercise Program:  For increased strengthening  For improved balance  With Supervision, verbal cues required/provided  11:22 AM, 01/09/22 Mearl Latin PT, DPT Physical Therapist at [facility]

## 2022-01-09 NOTE — Progress Notes (Signed)
Daughter Santiago Glad  called for another update.. Educated on Mychart and the availably to see the same information that she and I spoke with ay 0900 and that there had been no other lab updates since 01/09/2022 0300.Marland Kitchen PT is sitting up right in bed eating lunch, and able to make needs known, with his cell phone on side table

## 2022-01-09 NOTE — TOC Progression Note (Signed)
Transition of Care [facility]) - Progression Note    Patient Details  Name: Bradley Sheppard MRN: [id number] Date of Birth: [birth year]  Transition of Care [facility]) CM/SW Contact  Boneta Lucks, RN Phone Number: 01/09/2022, 3:01 PM  Clinical Narrative:   TOC spoke with patients daughter, Waiting for his wife to come visit so they can discuss SNF recommendation given by PT. DC planning for Monday.

## 2022-01-09 NOTE — Progress Notes (Signed)
Attending at bedside and GI Dr. Franchot Erichsen is on the phn. Both were able to answer her questions and put pt's at ease. This nursing staff will continue to monitor. No new orders at this time.

## 2022-01-09 NOTE — Progress Notes (Signed)
Changed pt. Diet to dysphagia 3 per family/pt. Request. Pt. Is wanting more chopped food d/t him not having teeth. Diet ordered.

## 2022-01-09 NOTE — Progress Notes (Signed)
Wife is at bed side message sent to Attending for her request to speak with him and GI .Marland Kitchen Nurinf staff cleaned pt and assisted in ADL'S. Pt is bearing down and having bright red blood at rectum. Message sent. No new orders at this time. Will continue to monitor.

## 2022-01-09 NOTE — Progress Notes (Signed)
PROGRESS NOTE   Bradley Sheppard, is a 81 y.o. male, DOB - [birth year], OIZ:[id number]  Admit date - 01/08/2022   Admitting Physician Berthel Bagnall Denton Brick, MD  Outpatient Primary MD for the patient is [name], MD  LOS - 0  Chief Complaint  Patient presents with   Weakness        Brief Narrative:  81 y.o. 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   -PTA patient was on oral Lasix -Continue IV Lasix -Fluid balance negative about 4 L since admission -Orthopnea and dyspnea improving -Daily weight and fluid input and output monitoring -  2)CAD status post DES to the LAD and RCA in 2003--- no chest pains or ACS type symptoms -His cardiologist previously recommended medical management including  aspirin, Coreg, Crestor, and as needed nitroglycerin.   3)Moderate calcific aortic stenosis with mean gradient 18 mmHg and dimensionless index 0.44.   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 Florentina Addison from general surgery service... Dr. Johney Maine was kind enough to talk to patient and his wife on my speaker phone -Dr. Johney Maine recommends transfusion of PRBC and IV iron as needed -He advises against surgical intervention at this time unless patient needs up to 6 units of PRBC--very high treshold as per Dr. Johney Maine for re-intervention...  -As per Dr. Johney Maine rectal bleeding may continue for 2 weeks or more after such procedure - 6)Non-Hodgkin's lymphoma/MALT lymphoma ([place]) - BMBX on 09/02/2021 showed NHL with differential highly likely MALT lymphoma -Continue to follow-up as outpatient with oncology   7)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 Cordelia Poche bleeding is slowed down according to patient and family -General surgeon told him to expect some degree of slight rectal oozing -Avoid constipation   8)DM2--recent A1c 7.3 reflecting uncontrolled DM with hyperglycemia -Hold glipizide and metformin Use Novolog/Humalog Sliding scale insulin with Accu-Cheks/Fingersticks as ordered    9)Urinary Retention--- Foley was placed on 12/31/2021 perioperatively  -Patient presented this admission with Foley catheter in situ--POA -Continue Flomax and do voiding trial  prior to discharge   10)Hyponatremia--- sodium is down to 132 suspect due to Lasix use  and some hemodilution effect from CHF/volume overload -Monitor closely with IV Lasix   11)Generalized weakness and deconditioning----PT OT eval appreciated, recommends SNF rehab  12)Sacral/buttock decubitus ulcers/pressure injury--POA--- wound care consult appreciated -Please see photos in epic   Disposition/Need for in-Hospital Stay- patient unable to be discharged at this time due to CHF exacerbation requiring IV diuresis , symptomatic anemia requiring transfusion of PRBC, ongoing rectal bleeding requiring further monitoring -When medically stable patient will need SNF rehab   Dispo: The patient is from: Home              Anticipated d/c is to: Home with HH Vs SNF              Anticipated d/c date is: 2 day              Patient currently is not medically stable to d/c. Barriers: Not Clinically Stable-   Code Status :  -  Code Status: Full Code   Family Communication:   Discussed with patient's wife and daughter at bedside  DVT Prophylaxis  :   - SCDs/TEDs  Place and maintain sequential compression device Start: 01/09/22 1824 SCDs Start: 01/08/22 1300 Place TED hose Start: 01/08/22 1300   Lab Results  Component Value Date   PLT 325 01/09/2022    Inpatient Medications  Scheduled Meds:  sodium chloride   Intravenous Once   ascorbic acid  500 mg Oral QHS   aspirin EC  81 mg Oral Daily   carvedilol  12.5 mg Oral BID WC   Chlorhexidine Gluconate Cloth  6 each Topical Daily   ferrous sulfate  325 mg Oral BID   furosemide  20 mg Intravenous QPM   furosemide  40 mg Intravenous q AM   heparin  5,000 Units Subcutaneous Q8H   insulin aspart  0-15 Units Subcutaneous TID WC   insulin aspart  0-5 Units Subcutaneous QHS   LORazepam  0.5 mg Oral QHS   pioglitazone  45 mg Oral Daily   potassium chloride  40 mEq Oral Once   [START ON 01/14/2022] rosuvastatin  5 mg Oral Q Wed   senna-docusate  2 tablet Oral QHS   sodium chloride flush  3 mL Intravenous Q12H   sodium chloride flush  3  mL Intravenous Q12H   tamsulosin  0.4 mg Oral Daily   tiZANidine  2 mg Oral QHS   cyanocobalamin  500 mcg Oral Daily   Continuous Infusions:  sodium chloride     PRN Meds:.sodium chloride, acetaminophen **OR** acetaminophen, bisacodyl, ondansetron **OR** ondansetron (ZOFRAN) IV, oxyCODONE, polyethylene glycol, sodium chloride flush, traZODone, Zinc Oxide   Anti-infectives (From admission, onward)    None         Subjective: Bradley Sheppard today has no fevers, no emesis,  No chest pain,   No fever  Or chills   No Nausea, Vomiting or Diarrhea Ongoing rectal bleeding/oozing noted  Objective: Vitals:   01/08/22 1742 01/08/22 1815 01/08/22 2053 01/09/22 0548  BP: (!) 141/89  136/82 120/69  Pulse: 85  86 (!) 105  Resp: '18  15 15  '$ Temp: 97.8 F (36.6 C)  97.7 F (36.5 C) 97.7 F (36.5 C)  TempSrc: Oral  Oral Oral  SpO2: 99%  99% 98%  Weight:    96.3 kg  Height:  6' (1.829 m)      Intake/Output Summary (Last 24 hours) at 01/09/2022  0802 Last data filed at 01/09/2022 0500 Gross per 24 hour  Intake 960 ml  Output 5050 ml  Net -4090 ml   Filed Weights   01/09/22 0548  Weight: 96.3 kg    Physical Exam  Gen:- Awake Alert,  in no apparent distress  HEENT:- [place].AT, No sclera icterus Neck-Supple Neck,No JVD,.  Lungs-improving air movement, no wheezing  CV- S1, S2 normal, regular , 3/6 SM Abd-  +ve B.Sounds, Abd Soft, No tenderness,    Extremities -+3 pedal edema noted, Lt > Rt, intact peripheral pulses  Skin -sacral and buttock decubitus ulcers Psych-affect is appropriate, oriented x3 Neuro-generalized weakness no new focal deficits, no tremors  Data Reviewed: I have personally reviewed following labs and imaging studies  CBC: Recent Labs  Lab 01/08/22 1105 01/09/22 0317  WBC 7.6 5.9  NEUTROABS 4.8  --   HGB 8.1* 8.9*  HCT 24.5* 26.1*  MCV 93.2 91.6  PLT 285 767   Basic Metabolic Panel: Recent Labs  Lab 01/08/22 1105 01/09/22 0317  NA 132* 137  K 4.0  3.5  CL 101 105  CO2 23 24  GLUCOSE 129* 80  BUN 47* 43*  CREATININE 1.79* 1.59*  CALCIUM 8.1* 8.4*   GFR: Estimated Creatinine Clearance: 42.4 mL/min (A) (by C-G formula based on SCr of 1.59 mg/dL (H)).  Radiology Studies: DG Chest Port 1 View  Result Date: 01/08/2022 CLINICAL DATA:  Weakness, left leg swelling EXAM: PORTABLE CHEST 1 VIEW COMPARISON:  Radiograph 11/27/2021 FINDINGS: Unchanged enlarged cardiac silhouette. Aortic arch calcifications. There are mild diffuse interstitial opacities. There is no pleural effusion. No pneumothorax. Thoracic spondylosis with dextroconvex curvature. Severe bilateral glenohumeral osteoarthritis. IMPRESSION: Cardiomegaly with interstitial pulmonary edema. Electronically Signed   By: Maurine Simmering M.D.   On: 01/08/2022 12:04   US Venous Img Lower Bilateral (DVT)  Result Date: 01/08/2022 CLINICAL DATA:  Bilateral lower extremity edema for 1 month. EXAM: Bilateral LOWER EXTREMITY VENOUS DOPPLER ULTRASOUND TECHNIQUE: Gray-scale sonography with compression, as well as color and duplex ultrasound, were performed to evaluate the deep venous system(s) from the level of the common femoral vein through the popliteal and proximal calf veins. COMPARISON:  No recent comparison imaging. Imaging from 2009 is available. FINDINGS: VENOUS Normal compressibility of the common femoral, superficial femoral, and popliteal veins, as well as the visualized calf veins. Visualized portions of profunda femoral vein and great saphenous vein unremarkable. No filling defects to suggest DVT on grayscale or color Doppler imaging. Doppler waveforms show normal direction of venous flow, normal respiratory plasticity and response to augmentation. OTHER Edema in the lower leg bilaterally. Limitations: Limited assessment of bilateral calf veins due to presence of lower extremity, lower leg edema. IMPRESSION: Negative for DVT. Mildly limited with respect to calf vein assessment due to lower leg  edema. Electronically Signed   By: Zetta Bills M.D.   On: 01/08/2022 11:45     Scheduled Meds:  sodium chloride   Intravenous Once   ascorbic acid  500 mg Oral QHS   aspirin EC  81 mg Oral Daily   carvedilol  12.5 mg Oral BID WC   Chlorhexidine Gluconate Cloth  6 each Topical Daily   ferrous sulfate  325 mg Oral BID   furosemide  20 mg Intravenous QPM   furosemide  40 mg Intravenous q AM   heparin  5,000 Units Subcutaneous Q8H   insulin aspart  0-15 Units Subcutaneous TID WC   insulin aspart  0-5 Units Subcutaneous QHS   LORazepam  0.5 mg Oral QHS   pioglitazone  45 mg Oral Daily   potassium chloride  40 mEq Oral Once   [START ON 01/14/2022] rosuvastatin  5 mg Oral Q Wed   senna-docusate  2 tablet Oral QHS   sodium chloride flush  3 mL Intravenous Q12H   sodium chloride flush  3 mL Intravenous Q12H   tamsulosin  0.4 mg Oral Daily   tiZANidine  2 mg Oral QHS   cyanocobalamin  500 mcg Oral Daily   Continuous Infusions:  sodium chloride       LOS: 0 days    Roxan Hockey M.D on 01/09/2022 at 8:02 AM  Go to [email or web address] - for contact info  [facility] - Office  [phone]  If 7PM-7AM, please contact night-coverage [email or web address] 01/09/2022, 8:02 AM

## 2022-01-10 DIAGNOSIS — I350 Nonrheumatic aortic (valve) stenosis: Secondary | ICD-10-CM | POA: Diagnosis not present

## 2022-01-10 DIAGNOSIS — N1832 Chronic kidney disease, stage 3b: Secondary | ICD-10-CM | POA: Diagnosis not present

## 2022-01-10 DIAGNOSIS — C884 Extranodal marginal zone B-cell lymphoma of mucosa-associated lymphoid tissue [MALT-lymphoma]: Secondary | ICD-10-CM | POA: Diagnosis not present

## 2022-01-10 DIAGNOSIS — I5033 Acute on chronic diastolic (congestive) heart failure: Secondary | ICD-10-CM | POA: Diagnosis not present

## 2022-01-10 LAB — GLUCOSE, CAPILLARY
Glucose-Capillary: 178 mg/dL — ABNORMAL HIGH (ref 70–99)
Glucose-Capillary: 189 mg/dL — ABNORMAL HIGH (ref 70–99)
Glucose-Capillary: 252 mg/dL — ABNORMAL HIGH (ref 70–99)
Glucose-Capillary: 115 mg/dL — ABNORMAL HIGH (ref 70–99)

## 2022-01-10 LAB — RENAL FUNCTION PANEL
Albumin: 2.3 g/dL — ABNORMAL LOW (ref 3.5–5.0)
Anion gap: 7 (ref 5–15)
BUN: 43 mg/dL — ABNORMAL HIGH (ref 8–23)
CO2: 25 mmol/L (ref 22–32)
Calcium: 8.2 mg/dL — ABNORMAL LOW (ref 8.9–10.3)
Chloride: 104 mmol/L (ref 98–111)
Creatinine, Ser: 1.66 mg/dL — ABNORMAL HIGH (ref 0.61–1.24)
GFR, Estimated: 41 mL/min — ABNORMAL LOW (ref >60)
Glucose, Bld: 178 mg/dL — ABNORMAL HIGH (ref 70–99)
Phosphorus: 3.2 mg/dL (ref 2.5–4.6)
Potassium: 4.1 mmol/L (ref 3.5–5.1)
Sodium: 136 mmol/L (ref 135–145)

## 2022-01-10 LAB — CBC
HCT: 25.9 % — ABNORMAL LOW (ref 39.0–52.0)
Hemoglobin: 8.6 g/dL — ABNORMAL LOW (ref 13.0–17.0)
MCH: 30.7 pg (ref 26.0–34.0)
MCHC: 33.2 g/dL (ref 30.0–36.0)
MCV: 92.5 fL (ref 80.0–100.0)
Platelets: 365 10*3/uL (ref 150–400)
RBC: 2.8 MIL/uL — ABNORMAL LOW (ref 4.22–5.81)
RDW: 14.2 % (ref 11.5–15.5)
WBC: 6.9 10*3/uL (ref 4.0–10.5)
nRBC: 0 % (ref 0.0–0.2)

## 2022-01-10 MED ORDER — LOPERAMIDE HCL 2 MG PO CAPS
2.0000 mg | ORAL_CAPSULE | Freq: Every day | ORAL | Status: DC
  Administered 2022-01-10 – 2022-01-12 (×3): 2 mg via ORAL
  Filled 2022-01-10 (×3): qty 1

## 2022-01-10 MED ORDER — CALCIUM POLYCARBOPHIL 625 MG PO TABS
625.0000 mg | ORAL_TABLET | Freq: Two times a day (BID) | ORAL | Status: DC
  Administered 2022-01-10 – 2022-01-12 (×6): 625 mg via ORAL
  Filled 2022-01-10 (×7): qty 1

## 2022-01-10 MED ORDER — TRAZODONE HCL 50 MG PO TABS
50.0000 mg | ORAL_TABLET | Freq: Every day | ORAL | Status: DC
  Administered 2022-01-10 – 2022-01-12 (×3): 50 mg via ORAL
  Filled 2022-01-10 (×3): qty 1

## 2022-01-10 MED ORDER — LOPERAMIDE HCL 2 MG PO CAPS
2.0000 mg | ORAL_CAPSULE | Freq: Four times a day (QID) | ORAL | Status: DC | PRN
  Administered 2022-01-12: 2 mg via ORAL
  Filled 2022-01-10: qty 2
  Filled 2022-01-10: qty 1

## 2022-01-10 NOTE — Progress Notes (Signed)
Called by Dr.Emokpae  Patient familiar to me for a transanal resection by TEM of a bulky rectal polyp.  Pathology consistent with adenomatous polyp.  Patient readmitted for shortness of breath and heart failure at [facility].  I was not aware of this admission until the admitting service called me today..  Wife concerned about persistent rectal bleeding.  Patient not severely anemic but given his cardiopulmonary issues was transfused 1 unit according to primary service.  Patient having some occasional rectal bleeding but not profuse.  Patient on anticoagulation prophylaxis.  Primary service asked if I talked to the patient and wife on the phone for some reassurance and guidance.  I did note the pathology came back showing adenomatous polyp.  No high-grade dysplasia.  No cancer.  Margins negative.  Reassuring.  We had left a message about these results.  His readmission most likely explains why we had not heard back.  I explained the good results to the patient's wife and expressed understanding.  Noted I do not have privileges and cannot come up there.  They chose to go to that hospital instead of returning to [facility].  Pt's hemoglobin is in the 8s & not rapidly falling.  He is not in hemorrhagic shock.  He does not have massive hematochezia.  I cautioned that rectal bleeding is common within the first couple weeks of surgery.  He may have had breakdown in one of his sutures and has some granulation in the rectal wall.  This should improve with time.  Hold anticoagulation x48 hours.  Replete with IV iron again that should help as well.  Slow down his bowels.   -Continue oral iron twice daily to constipate.   -Can should be taking some type of fiber supplement such as FiberCon twice a day to bulk up and soften stools. -Do as needed Imodium to slow down loose diarrhea bowel movements to 1-2 bowel moods a day.  That should help.  Okay to do sitz bath's and soaks as needed.  Patient's  wife seemed somewhat reassured.  Dr. Denton Brick me appreciated talking the patient.  We will try and follow as an outpatient.

## 2022-01-10 NOTE — TOC Initial Note (Signed)
Transition of Care [facility]) - Initial/Assessment Note    Patient Details  Name: Bradley Sheppard MRN: [id number] Date of Birth: Jan 02, 1941  Transition of Care [facility]) CM/SW Contact:    Boneta Lucks, RN Phone Number: 01/10/2022, 8:54 AM  Clinical Narrative:   Patient admitted with acute CHF exacerbation as well as ongoing rectal bleeding. Last admission patient walked 25 feet and discharged home with home health, active with [place].  PT eval, patient walked 3 feet. TOC spoke with patients wife, they really rather him be home, but agrees he needs to be stronger. She is agreeable for CM to send out FL2 for bed offers in [place]. [facility] is the first choice. Patient has not had any COVID vaccines.  PASSR pending due to SS# not matching, CM called Friday and Saturday, message states, circumstances beyound our control, no assistance is available at this time.  FL2 completed and sent out. TOC to follow for PASSR number.            Expected Discharge Plan: Skilled Nursing Facility Barriers to Discharge: Continued Medical Work up  Patient Goals and CMS Choice Patient states their goals for this hospitalization and ongoing recovery are:: agreeable to SNF CMS Medicare.gov Compare Post Acute Care list provided to:: Patient Represenative (must comment) Choice offered to / list presented to : Spouse  Expected Discharge Plan and Services Expected Discharge Plan: [place]      Living arrangements for the past 2 months: Single Family Home       Prior Living Arrangements/Services Living arrangements for the past 2 months: Single Family Home Lives with:: Spouse Patient language and need for interpreter reviewed:: Yes        Need for Family Participation in Patient Care: Yes (Comment) Care giver support system in place?: Yes (comment)   Criminal Activity/Legal Involvement Pertinent to Current Situation/Hospitalization: No - Comment as needed  Activities of Daily Living Home Assistive  Devices/Equipment: Eyeglasses, Environmental consultant (specify type), Bedside commode/3-in-1, CBG Meter ADL Screening (condition at time of admission) Patient's cognitive ability adequate to safely complete daily activities?: Yes Is the patient deaf or have difficulty hearing?: Yes Does the patient have difficulty seeing, even when wearing glasses/contacts?: No Does the patient have difficulty concentrating, remembering, or making decisions?: No Patient able to express need for assistance with ADLs?: Yes Does the patient have difficulty dressing or bathing?: Yes Independently performs ADLs?: No Communication: Independent Dressing (OT): Needs assistance Is this a change from baseline?: Pre-admission baseline Grooming: Independent Feeding: Independent Bathing: Needs assistance Is this a change from baseline?: Pre-admission baseline Toileting: Needs assistance Is this a change from baseline?: Pre-admission baseline In/Out Bed: Needs assistance Is this a change from baseline?: Pre-admission baseline Walks in Home: Independent with device (comment) Is this a change from baseline?: Pre-admission baseline Does the patient have difficulty walking or climbing stairs?: Yes Weakness of Legs: Both Weakness of Arms/Hands: Both  Permission Sought/Granted      Permission granted to share info w Relationship: wife     Emotional Assessment     Orientation: : Oriented to Self, Oriented to Place, Oriented to  Time, Oriented to Situation Alcohol / Substance Use: Not Applicable Psych Involvement: No (comment)  Admission diagnosis:  Acute exacerbation of CHF (congestive heart failure) (HCC) [I50.9] Patient Active Problem List   Diagnosis Date Noted   Acute exacerbation of  diastolic CHF (congestive heart failure) ([place]) 01/08/2022   Decubitus skin ulcer 01/08/2022   Acute urinary retention 01/02/2022   Mass in rectum 12/31/2021  Pressure injury of skin 11/29/2021   Chronic kidney disease, stage 3b ([place])  11/26/2021   Acute on chronic congestive heart failure (HCC)    SOB (shortness of breath) 11/23/2021   Acute heart failure with preserved ejection fraction (HFpEF) ([place]) 11/23/2021    Class: Acute   DMII (diabetes mellitus, type 2) ([place]) 11/23/2021   Rectal mass 11/23/2021    Class: Chronic   Bilateral pleural effusion 11/23/2021    Class: Acute   Fecal incontinence alternating with constipation 11/11/2021   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 [place] [address] Phone: [phone] Fax: [phone]   Readmission Risk Interventions    01/10/2022    8:53 AM 01/01/2022    2:42 PM 01/01/2022    8:55 AM  Readmission Risk Prevention Plan  Transportation Screening Complete Complete Complete  PCP or Specialist Appt within 3-5 Days Not Complete  Complete  HRI or Home Care Consult Complete  Complete  Social Work Consult for [place] Planning/Counseling Complete  Complete  Palliative Care Screening Not Complete  Not Applicable  Medication Review Press photographer) Complete  Complete

## 2022-01-10 NOTE — Progress Notes (Signed)
PROGRESS NOTE   Bradley Sheppard, is a 81 y.o. male, DOB - 05-Oct-1940, JSE:[id number]  Admit date - 01/08/2022   Admitting Physician [place] Latulippe Denton Brick, MD  Outpatient Primary MD for the patient is [name], MD  LOS - 1  Chief Complaint  Patient presents with   Weakness        Brief Narrative:  81 y.o. 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   -PTA patient was on oral Lasix -Continue IV Lasix -Fluid balance negative about 4 L since admission -Orthopnea and dyspnea improving -Daily weight and fluid input and output monitoring -  2)CAD status post DES to the LAD and RCA in 2003--- no chest pains or ACS type symptoms -His cardiologist previously recommended medical management including  aspirin, Coreg, Crestor, and as needed nitroglycerin.   3)Moderate calcific aortic stenosis with mean gradient 18 mmHg and dimensionless index 0.44.   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 -creatinine on admission 1.79  baseline usually  around 1.3-1.4 -Creatinine trended down slightly -renally adjust medications, avoid nephrotoxic agents / dehydration  / hypotension -Watch renal function closely with IV diuresis   5)Acute on chronic symptomatic anemia--- at baseline patient has anemia related to lymphoma CKD 3B -Suspect some component of acute blood loss anemia due to recent rectal surgery and ongoing rectal bleeding --Hemoglobin is up to 8.6 from 8.1 admission after transfusion of 1 unit of PRBC Hgb 9.8 on 12/09/2021 -Platelets 285 -Given CHF exacerbation in the setting of underlying CAD transfuse 1 unit of PRBC keep hemoglobin close to 9 -Discussed with Dr. Alwyn Pea from general surgery service... Dr. Johney Maine agreed to  talk to patient and his wife on my speaker phone on 01/09/22... pt and wife seemed reassured -Dr. Johney Maine recommends transfusion of PRBC and IV iron as needed -He advises against surgical intervention at this time unless patient needs up to 6 units of PRBC--very high treshold as per Dr. Johney Maine for re-intervention...  -As per Dr. 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 Cordelia Poche bleeding is slowed down according to patient and family -General surgeon told him to expect some degree of slight rectal oozing -Discussed with general surgeon Dr. Michael Boston.Marland Kitchen  He recommends Imodium, fiber supplements -Please see supplemental note from Dr. Johney Maine   8)DM2--recent A1c 7.3 reflecting uncontrolled DM with hyperglycemia -Hold glipizide and metformin Use Novolog/Humalog Sliding scale insulin with Accu-Cheks/Fingersticks as ordered  9)Urinary Retention--- Foley was placed on 12/31/2021 perioperatively  -Patient presented this admission with  Foley catheter in situ--POA -Continue Flomax and do voiding trial prior to discharge   10)Hyponatremia--- sodium was down to 132 suspect due to  hemodilution effect from CHF/volume overload -sodium has normalized with diuresis  11)Generalized weakness and deconditioning----PT OT eval appreciated, recommends SNF rehab  12)Sacral/buttock decubitus ulcers/pressure injury--POA--- wound care consult appreciated -Please see photos in epic   Disposition/Need for in-Hospital Stay- patient unable to be discharged at this time due to CHF exacerbation requiring IV diuresis , symptomatic anemia requiring transfusion of PRBC, ongoing rectal bleeding requiring further monitoring -When medically stable patient will need SNF rehab   Dispo: The patient is from: Home              Anticipated d/c is to:   SNF              Anticipated d/c date is: 2 day              Patient currently is not medically stable to d/c. Barriers: Not Clinically Stable-   Code Status :  -  Code Status: Full Code   Family Communication:   Discussed with patient's wife and daughter at bedside  DVT Prophylaxis  :   - SCDs/TEDs  Place and maintain sequential compression device Start: 01/09/22 1824 SCDs Start: 01/08/22 1300 Place TED hose Start: 01/08/22 1300   Lab Results  Component Value Date   PLT 365 01/10/2022    Inpatient Medications  Scheduled Meds:  sodium chloride   Intravenous Once   ascorbic acid  500 mg Oral QHS   carvedilol  12.5 mg Oral BID WC   Chlorhexidine Gluconate Cloth  6 each Topical Daily   ferrous sulfate  325 mg Oral BID   furosemide  40 mg Intravenous q AM   insulin aspart  0-15 Units Subcutaneous TID WC   insulin aspart  0-5 Units Subcutaneous QHS   loperamide  2 mg Oral QHS   LORazepam  0.5 mg Oral QHS   polycarbophil  625 mg Oral BID   [START ON 01/14/2022] rosuvastatin  5 mg Oral Q Wed   sodium chloride flush  3 mL Intravenous Q12H   sodium chloride flush  3 mL Intravenous Q12H    tamsulosin  0.4 mg Oral Daily   tiZANidine  2 mg Oral QHS   traZODone  50 mg Oral QHS   cyanocobalamin  500 mcg Oral Daily   Continuous Infusions:  sodium chloride     PRN Meds:.sodium chloride, bisacodyl, loperamide, ondansetron **OR** ondansetron (ZOFRAN) IV, oxyCODONE, sodium chloride flush, Zinc Oxide   Anti-infectives (From admission, onward)    None       Subjective: Boby Eyer today has no fevers, no emesis,  No chest pain,   Wife at bedside Less rectal oozing/bleeding Oral intake is fair  Objective: Vitals:   01/09/22 0548 01/09/22 1403 01/09/22 2100 01/10/22 0421  BP: 120/69 129/63 136/77 125/63  Pulse: (!) 105 78 (!) 104 (!) 102  Resp: '15 20 18 18  '$ Temp: 97.7 F (36.5 C) 98 F (36.7 C) 98.1 F (36.7 C) 98.5 F (36.9 C)  TempSrc: Oral Oral Oral Oral  SpO2: 98% 100% 99% 96%  Weight: 96.3 kg   96.4 kg  Height:        Intake/Output Summary (Last 24 hours) at 01/10/2022 1217 Last data filed at 01/10/2022 1008 Gross per 24 hour  Intake 480 ml  Output 1725  ml  Net -1245 ml   Filed Weights   01/09/22 0548 01/10/22 0421  Weight: 96.3 kg 96.4 kg    Physical Exam  Gen:- Awake Alert,  in no apparent distress  HEENT:- [place].AT, No sclera icterus Neck-Supple Neck,No JVD,.  Lungs-improving air movement, no wheezing  CV- S1, S2 normal, regular , 3/6 SM Abd-  +ve B.Sounds, Abd Soft, No tenderness,    Extremities -+3 pedal edema noted, Lt > Rt, intact peripheral pulses  Skin -sacral and buttock decubitus ulcers--see Photos in Epic Psych-affect is appropriate, oriented x3 Neuro-generalized weakness,  no new focal deficits, no tremors GU- foley with clear urine  Data Reviewed: I have personally reviewed following labs and imaging studies  CBC: Recent Labs  Lab 01/08/22 1105 01/09/22 0317 01/10/22 0412  WBC 7.6 5.9 6.9  NEUTROABS 4.8  --   --   HGB 8.1* 8.9* 8.6*  HCT 24.5* 26.1* 25.9*  MCV 93.2 91.6 92.5  PLT 285 325 948   Basic Metabolic  Panel: Recent Labs  Lab 01/08/22 1105 01/09/22 0317 01/10/22 0412  NA 132* 137 136  K 4.0 3.5 4.1  CL 101 105 104  CO2 '23 24 25  '$ GLUCOSE 129* 80 178*  BUN 47* 43* 43*  CREATININE 1.79* 1.59* 1.66*  CALCIUM 8.1* 8.4* 8.2*  PHOS  --   --  3.2   GFR: Estimated Creatinine Clearance: 40.6 mL/min (A) (by C-G formula based on SCr of 1.66 mg/dL (H)).  Radiology Studies: No results found.   Scheduled Meds:  sodium chloride   Intravenous Once   ascorbic acid  500 mg Oral QHS   carvedilol  12.5 mg Oral BID WC   Chlorhexidine Gluconate Cloth  6 each Topical Daily   ferrous sulfate  325 mg Oral BID   furosemide  40 mg Intravenous q AM   insulin aspart  0-15 Units Subcutaneous TID WC   insulin aspart  0-5 Units Subcutaneous QHS   loperamide  2 mg Oral QHS   LORazepam  0.5 mg Oral QHS   polycarbophil  625 mg Oral BID   [START ON 01/14/2022] rosuvastatin  5 mg Oral Q Wed   sodium chloride flush  3 mL Intravenous Q12H   sodium chloride flush  3 mL Intravenous Q12H   tamsulosin  0.4 mg Oral Daily   tiZANidine  2 mg Oral QHS   traZODone  50 mg Oral QHS   cyanocobalamin  500 mcg Oral Daily   Continuous Infusions:  sodium chloride       LOS: 1 day   Roxan Hockey M.D on 01/10/2022 at 12:17 PM  Go to [email or web address] - for contact info  [facility] - Office  [phone]  If 7PM-7AM, please contact night-coverage [email or web address] 01/10/2022, 12:17 PM

## 2022-01-10 NOTE — NC FL2 (Signed)
[place] LEVEL OF CARE SCREENING TOOL     IDENTIFICATION  Patient Name: Bradley Sheppard Birthdate: 04-Feb-1941 Sex: male Admission Date (Current Location): 01/08/2022  [facility] and [place] Number:  [employer] and Address:  [place] [address] [place]      Provider Number: [id number]  Attending Physician Name and Address:  Roxan Hockey, MD  Relative Name and Phone Number:  Geronimo, Diliberto [phone]   [phone]    Current Level of Care: Hospital Recommended Level of Care: [place] Prior Approval Number:    Date Approved/Denied:   PASRR Number:    Discharge Plan: SNF    Current Diagnoses: Patient Active Problem List   Diagnosis Date Noted   Acute exacerbation of  diastolic CHF (congestive heart failure) ([place]) 01/08/2022   Decubitus skin ulcer 01/08/2022   Acute urinary retention 01/02/2022   Mass in rectum 12/31/2021   Pressure injury of skin 11/29/2021   Chronic kidney disease, stage 3b ([place]) 11/26/2021   Acute on chronic congestive heart failure (HCC)    SOB (shortness of breath) 11/23/2021   Acute heart failure with preserved ejection fraction (HFpEF) ([place]) 11/23/2021   DMII (diabetes mellitus, type 2) ([place]) 11/23/2021   Rectal mass 11/23/2021   Bilateral pleural effusion 11/23/2021   Fecal incontinence alternating with constipation 11/11/2021   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   Iron deficiency anemia 07/28/2021   Peptic ulcer associated with Helicobacter pylori infection 03/27/2019   Acute on Chronic Anemia superimposed on chronic disease 03/27/2019   Mixed hyperlipidemia 04/03/2009   Essential hypertension, benign 04/03/2009   CORONARY ATHEROSCLEROSIS NATIVE CORONARY ARTERY 04/03/2009   Moderate-Severe Aortic stenosis 02/18/2009    Orientation RESPIRATION BLADDER Height & Weight     Self, Time, Situation, Place  Normal Incontinent Weight: 96.4 kg Height:  6' (182.9 cm)   BEHAVIORAL SYMPTOMS/MOOD NEUROLOGICAL BOWEL NUTRITION STATUS      Incontinent Diet (See DC summary)  AMBULATORY STATUS COMMUNICATION OF NEEDS Skin   Extensive Assist Verbally PU Stage and Appropriate Care, Other (Comment) (stage 2 buttocks)                       Personal Care Assistance Level of Assistance  Bathing, Feeding, Dressing Bathing Assistance: Maximum assistance Feeding assistance: Limited assistance Dressing Assistance: Maximum assistance     Functional Limitations Info  Sight, Hearing, Speech Sight Info: Impaired Hearing Info: Adequate Speech Info: Adequate    SPECIAL CARE FACTORS FREQUENCY  PT (By licensed PT)     PT Frequency: 5 times a week              Contractures Contractures Info: Not present    Additional Factors Info  Code Status, Allergies Code Status Info: FULL Allergies Info: prednisone           Current Medications (01/10/2022):  This is the current hospital active medication list Current Facility-Administered Medications  Medication Dose Route Frequency Provider Last Rate Last Admin   0.9 %  sodium chloride infusion (Manually program via Guardrails IV Fluids)   Intravenous Once Emokpae, Courage, MD       0.9 %  sodium chloride infusion   Intravenous PRN Emokpae, Courage, MD       acetaminophen (TYLENOL) tablet 650 mg  650 mg Oral Q6H PRN Denton Brick, Courage, MD   650 mg at 01/10/22 0837   Or   acetaminophen (TYLENOL) suppository 650 mg  650 mg Rectal Q6H PRN Emokpae,  Courage, MD       ascorbic acid (VITAMIN C) tablet 500 mg  500 mg Oral QHS Emokpae, Courage, MD   500 mg at 01/09/22 2239   bisacodyl (DULCOLAX) suppository 10 mg  10 mg Rectal Daily PRN Roxan Hockey, MD   10 mg at 01/09/22 1759   carvedilol (COREG) tablet 12.5 mg  12.5 mg Oral BID WC Emokpae, Courage, MD   12.5 mg at 01/10/22 0837   Chlorhexidine Gluconate Cloth 2 % PADS 6 each  6 each Topical Daily Roxan Hockey, MD   6 each at 01/09/22 0807   ferrous sulfate  tablet 325 mg  325 mg Oral BID Emokpae, Courage, MD   325 mg at 01/09/22 2238   furosemide (LASIX) injection 20 mg  20 mg Intravenous QPM Emokpae, Courage, MD   20 mg at 01/09/22 1730   furosemide (LASIX) injection 40 mg  40 mg Intravenous q AM Emokpae, Courage, MD   40 mg at 01/10/22 0617   insulin aspart (novoLOG) injection 0-15 Units  0-15 Units Subcutaneous TID WC Emokpae, Courage, MD   3 Units at 01/10/22 0838   insulin aspart (novoLOG) injection 0-5 Units  0-5 Units Subcutaneous QHS Emokpae, Courage, MD       LORazepam (ATIVAN) tablet 0.5 mg  0.5 mg Oral QHS Emokpae, Courage, MD   0.5 mg at 01/09/22 2238   ondansetron (ZOFRAN) tablet 4 mg  4 mg Oral Q6H PRN Emokpae, Courage, MD       Or   ondansetron (ZOFRAN) injection 4 mg  4 mg Intravenous Q6H PRN Emokpae, Courage, MD       oxyCODONE (Oxy IR/ROXICODONE) immediate release tablet 2.5-5 mg  2.5-5 mg Oral Q6H PRN Emokpae, Courage, MD   5 mg at 01/10/22 0618   polyethylene glycol (MIRALAX / GLYCOLAX) packet 17 g  17 g Oral Daily PRN Emokpae, Courage, MD   17 g at 01/09/22 1410   [START ON 01/14/2022] rosuvastatin (CRESTOR) tablet 5 mg  5 mg Oral Q Wed Emokpae, Courage, MD       senna-docusate (Senokot-S) tablet 2 tablet  2 tablet Oral QHS Emokpae, Courage, MD   2 tablet at 01/09/22 2238   sodium chloride flush (NS) 0.9 % injection 3 mL  3 mL Intravenous Q12H Emokpae, Courage, MD   3 mL at 01/09/22 2241   sodium chloride flush (NS) 0.9 % injection 3 mL  3 mL Intravenous Q12H Emokpae, Courage, MD   3 mL at 01/09/22 2241   sodium chloride flush (NS) 0.9 % injection 3 mL  3 mL Intravenous PRN Emokpae, Courage, MD       tamsulosin (FLOMAX) capsule 0.4 mg  0.4 mg Oral Daily Emokpae, Courage, MD   0.4 mg at 01/09/22 0803   tiZANidine (ZANAFLEX) tablet 2 mg  2 mg Oral QHS Emokpae, Courage, MD   2 mg at 01/09/22 2239   traZODone (DESYREL) tablet 50 mg  50 mg Oral QHS PRN Emokpae, Courage, MD       vitamin B-12 (CYANOCOBALAMIN) tablet 500 mcg  500 mcg  Oral Daily Emokpae, Courage, MD   500 mcg at 01/09/22 5427   Zinc Oxide 40 % PSTE   Topical PRN Roxan Hockey, MD         Discharge Medications: Please see discharge summary for a list of discharge medications.  Relevant Imaging Results:  Relevant Lab Results:   Additional Information SS# [id number]  Boneta Lucks, RN

## 2022-01-11 ENCOUNTER — Inpatient Hospital Stay (HOSPITAL_COMMUNITY): Payer: PPO

## 2022-01-11 DIAGNOSIS — I5033 Acute on chronic diastolic (congestive) heart failure: Secondary | ICD-10-CM | POA: Diagnosis not present

## 2022-01-11 DIAGNOSIS — L89309 Pressure ulcer of unspecified buttock, unspecified stage: Secondary | ICD-10-CM

## 2022-01-11 DIAGNOSIS — N1832 Chronic kidney disease, stage 3b: Secondary | ICD-10-CM | POA: Diagnosis not present

## 2022-01-11 DIAGNOSIS — I350 Nonrheumatic aortic (valve) stenosis: Secondary | ICD-10-CM | POA: Diagnosis not present

## 2022-01-11 LAB — CBC
HCT: 26 % — ABNORMAL LOW (ref 39.0–52.0)
Hemoglobin: 8.5 g/dL — ABNORMAL LOW (ref 13.0–17.0)
MCH: 30.9 pg (ref 26.0–34.0)
MCHC: 32.7 g/dL (ref 30.0–36.0)
MCV: 94.5 fL (ref 80.0–100.0)
Platelets: 385 10*3/uL (ref 150–400)
RBC: 2.75 MIL/uL — ABNORMAL LOW (ref 4.22–5.81)
RDW: 14.3 % (ref 11.5–15.5)
WBC: 7.1 10*3/uL (ref 4.0–10.5)
nRBC: 0 % (ref 0.0–0.2)

## 2022-01-11 LAB — GLUCOSE, CAPILLARY
Glucose-Capillary: 156 mg/dL — ABNORMAL HIGH (ref 70–99)
Glucose-Capillary: 194 mg/dL — ABNORMAL HIGH (ref 70–99)
Glucose-Capillary: 207 mg/dL — ABNORMAL HIGH (ref 70–99)
Glucose-Capillary: 217 mg/dL — ABNORMAL HIGH (ref 70–99)

## 2022-01-11 LAB — BASIC METABOLIC PANEL
Anion gap: 6 (ref 5–15)
BUN: 44 mg/dL — ABNORMAL HIGH (ref 8–23)
CO2: 27 mmol/L (ref 22–32)
Calcium: 8.3 mg/dL — ABNORMAL LOW (ref 8.9–10.3)
Chloride: 102 mmol/L (ref 98–111)
Creatinine, Ser: 1.69 mg/dL — ABNORMAL HIGH (ref 0.61–1.24)
GFR, Estimated: 40 mL/min — ABNORMAL LOW (ref >60)
Glucose, Bld: 162 mg/dL — ABNORMAL HIGH (ref 70–99)
Potassium: 4.6 mmol/L (ref 3.5–5.1)
Sodium: 135 mmol/L (ref 135–145)

## 2022-01-11 MED ORDER — FUROSEMIDE 10 MG/ML IJ SOLN
40.0000 mg | Freq: Once | INTRAMUSCULAR | Status: AC
Start: 2022-01-11 — End: 2022-01-11
  Administered 2022-01-11: 40 mg via INTRAVENOUS
  Filled 2022-01-11: qty 4

## 2022-01-11 MED ORDER — TAMSULOSIN HCL 0.4 MG PO CAPS
0.4000 mg | ORAL_CAPSULE | Freq: Two times a day (BID) | ORAL | Status: DC
  Administered 2022-01-11 – 2022-01-13 (×4): 0.4 mg via ORAL
  Filled 2022-01-11 (×4): qty 1

## 2022-01-11 NOTE — Progress Notes (Signed)
PROGRESS NOTE   Bradley Sheppard, is a 81 y.o. male, DOB - 1940/08/11, RAQ:[id number]  Admit date - 01/08/2022   Admitting Physician Frandy Basnett Denton Brick, MD  Outpatient Primary MD for the patient is [name], MD  LOS - 2  Chief Complaint  Patient presents with   Weakness        Brief Narrative:  81 y.o. 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   -PTA patient was on oral Torsemide -Continue IV Lasix -Fluid balance negative about 4 L since admission -Orthopnea and dyspnea improving -Daily weight and fluid input and output monitoring  2)CAD status post DES to the LAD and RCA in 2003--- no chest pains or ACS type symptoms -His cardiologist previously recommended medical management including  aspirin, Coreg, Crestor, and as needed nitroglycerin.   3)Moderate calcific aortic stenosis with mean gradient 18 mmHg and dimensionless index 0.44.   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 Alwyn Pea from general surgery service... Dr. Johney Maine agreed to  talk to patient and his wife on my speaker phone on 01/09/22... pt and wife seemed reassured -Dr. Johney Maine recommends transfusion of PRBC and IV iron as needed -He advises against surgical intervention at this time unless patient needs up to 6 units of PRBC--very high treshold as per Dr. Johney Maine for re-intervention...  -As per Dr. 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 Cordelia Poche bleeding is slowed down according to patient and family -General surgeon told him to expect some degree of slight rectal oozing -Discussed with general surgeon Dr. Michael Boston.Marland Kitchen  He recommends Imodium, fiber supplements -Please see supplemental note from Dr. Johney Maine   8)DM2--recent A1c 7.3 reflecting uncontrolled DM with hyperglycemia -Hold glipizide and metformin Use Novolog/Humalog Sliding scale insulin with Accu-Cheks/Fingersticks as ordered    9)Urinary  Retention--- Foley was placed on 12/31/2021 perioperatively  -Patient presented this admission with  Foley catheter in situ--POA -Continue Flomax  -failed voiding trial on 01/11/22   10)Hyponatremia--- sodium was down to 132 suspect due to  hemodilution effect from CHF/volume overload -sodium has normalized with diuresis  11)Generalized weakness and deconditioning----PT OT eval appreciated, recommends SNF rehab  12)Sacral/buttock decubitus ulcers/pressure injury--POA--- wound care consult appreciated -Please see photos in epic   Disposition/Need for in-Hospital Stay- patient unable to be discharged at this time due to - Patient is medically stable for transfer to SNF rehab when bed available medically stable patient will need SNF rehab   Dispo: The patient is from: Home              Anticipated d/c is to:   SNF              Anticipated d/c date is: 1 day              Patient currently medically stable to d/c. Barriers: SNF bed availability and insurance authorization  Code Status :  -  Code Status: Full Code   Family Communication:   Discussed with patient's wife and daughter at bedside  DVT Prophylaxis  :   - SCDs/TEDs  Place and maintain sequential compression device Start: 01/09/22 1824 SCDs Start: 01/08/22 1300 Place TED hose Start: 01/08/22 1300   Lab Results  Component Value Date   PLT 385 01/11/2022    Inpatient Medications  Scheduled Meds:  sodium chloride   Intravenous Once   ascorbic acid  500 mg Oral QHS   carvedilol  12.5 mg Oral BID WC   Chlorhexidine Gluconate Cloth  6 each Topical Daily   ferrous sulfate  325 mg Oral BID   furosemide  40 mg Intravenous q AM   insulin aspart  0-15 Units Subcutaneous TID WC   insulin aspart  0-5 Units Subcutaneous QHS   loperamide  2 mg Oral QHS   LORazepam  0.5 mg Oral QHS   polycarbophil  625 mg Oral BID   [START ON 01/14/2022] rosuvastatin  5 mg Oral Q Wed   sodium chloride flush  3 mL Intravenous Q12H   sodium chloride flush  3 mL Intravenous Q12H   tamsulosin  0.4 mg Oral BID   tiZANidine  2 mg Oral QHS    traZODone  50 mg Oral QHS   cyanocobalamin  500 mcg Oral Daily   Continuous Infusions:  sodium chloride     PRN Meds:.sodium chloride, bisacodyl, loperamide, ondansetron **OR** ondansetron (ZOFRAN) IV, oxyCODONE, sodium chloride flush, Zinc Oxide   Anti-infectives (From admission, onward)    None       Subjective: Adaiah Morken today has no fevers, no emesis,  No chest pain,   failed voiding trial on 01/11/22 -Rectal bleeding/bruising is improved significantly -Awaiting transfer to SNF when bed available  Objective: Vitals:   01/10/22 1649 01/10/22 2009 01/11/22 0343 01/11/22 1550  BP: 122/79 (!) 150/84 138/77 114/64  Pulse:  92 (!) 50 81  Resp:  '18 18 20  '$ Temp:  98.1 F (36.7 C) 97.8 F (36.6 C) 98.3 F (36.8 C)  TempSrc:  Oral Oral Oral  SpO2:  99% 94% 98%  Weight:   97.2 kg   Height:        Intake/Output Summary (Last 24 hours) at 01/11/2022 1813 Last data filed at 01/11/2022 1200 Gross per 24 hour  Intake 1080 ml  Output 450 ml  Net 630 ml  Filed Weights   01/09/22 0548 01/10/22 0421 01/11/22 0343  Weight: 96.3 kg 96.4 kg 97.2 kg    Physical Exam  Gen:- Awake Alert,  in no apparent distress  HEENT:- [place].AT, No sclera icterus Neck-Supple Neck,No JVD,.  Lungs-improving air movement, no wheezing  CV- S1, S2 normal, regular , 3/6 SM Abd-  +ve B.Sounds, Abd Soft, No tenderness,    Extremities -+3 pedal edema noted, Lt > Rt, intact peripheral pulses  Skin -sacral and buttock decubitus ulcers--see Photos in Epic Psych-affect is appropriate, oriented x3 Neuro-generalized weakness,  no new focal deficits, no tremors GU- foley with clear urine--failed voiding trial on 01/11/22  Data Reviewed: I have personally reviewed following labs and imaging studies  CBC: Recent Labs  Lab 01/08/22 1105 01/09/22 0317 01/10/22 0412 01/11/22 0507  WBC 7.6 5.9 6.9 7.1  NEUTROABS 4.8  --   --   --   HGB 8.1* 8.9* 8.6* 8.5*  HCT 24.5* 26.1* 25.9* 26.0*  MCV 93.2 91.6  92.5 94.5  PLT 285 325 365 631   Basic Metabolic Panel: Recent Labs  Lab 01/08/22 1105 01/09/22 0317 01/10/22 0412 01/11/22 0507  NA 132* 137 136 135  K 4.0 3.5 4.1 4.6  CL 101 105 104 102  CO2 '23 24 25 27  '$ GLUCOSE 129* 80 178* 162*  BUN 47* 43* 43* 44*  CREATININE 1.79* 1.59* 1.66* 1.69*  CALCIUM 8.1* 8.4* 8.2* 8.3*  PHOS  --   --  3.2  --    GFR: Estimated Creatinine Clearance: 40 mL/min (A) (by C-G formula based on SCr of 1.69 mg/dL (H)).  Radiology Studies: DG Chest 2 View  Result Date: 01/11/2022 CLINICAL DATA:  Dyspnea EXAM: CHEST - 2 VIEW COMPARISON:  01/08/2022 FINDINGS: Stable cardiomegaly. Aortic atherosclerosis. Mild diffuse interstitial prominence, similar to prior. No pleural effusion or pneumothorax. IMPRESSION: Cardiomegaly and mild edema, similar to prior. Electronically Signed   By: Davina Poke D.O.   On: 01/11/2022 11:29     Scheduled Meds:  sodium chloride   Intravenous Once   ascorbic acid  500 mg Oral QHS   carvedilol  12.5 mg Oral BID WC   Chlorhexidine Gluconate Cloth  6 each Topical Daily   ferrous sulfate  325 mg Oral BID   furosemide  40 mg Intravenous q AM   insulin aspart  0-15 Units Subcutaneous TID WC   insulin aspart  0-5 Units Subcutaneous QHS   loperamide  2 mg Oral QHS   LORazepam  0.5 mg Oral QHS   polycarbophil  625 mg Oral BID   [START ON 01/14/2022] rosuvastatin  5 mg Oral Q Wed   sodium chloride flush  3 mL Intravenous Q12H   sodium chloride flush  3 mL Intravenous Q12H   tamsulosin  0.4 mg Oral BID   tiZANidine  2 mg Oral QHS   traZODone  50 mg Oral QHS   cyanocobalamin  500 mcg Oral Daily   Continuous Infusions:  sodium chloride       LOS: 2 days   Roxan Hockey M.D on 01/11/2022 at 6:13 PM  Go to [email or web address] - for contact info  [facility] - Office  [phone]  If 7PM-7AM, please contact night-coverage [email or web address] 01/11/2022, 6:13 PM

## 2022-01-12 DIAGNOSIS — I350 Nonrheumatic aortic (valve) stenosis: Secondary | ICD-10-CM | POA: Diagnosis not present

## 2022-01-12 DIAGNOSIS — N1832 Chronic kidney disease, stage 3b: Secondary | ICD-10-CM | POA: Diagnosis not present

## 2022-01-12 DIAGNOSIS — C884 Extranodal marginal zone B-cell lymphoma of mucosa-associated lymphoid tissue [MALT-lymphoma]: Secondary | ICD-10-CM | POA: Diagnosis not present

## 2022-01-12 DIAGNOSIS — I5033 Acute on chronic diastolic (congestive) heart failure: Secondary | ICD-10-CM | POA: Diagnosis not present

## 2022-01-12 LAB — HEMOGLOBIN AND HEMATOCRIT, BLOOD
HCT: 27.2 % — ABNORMAL LOW (ref 39.0–52.0)
Hemoglobin: 9.1 g/dL — ABNORMAL LOW (ref 13.0–17.0)

## 2022-01-12 LAB — GLUCOSE, CAPILLARY
Glucose-Capillary: 162 mg/dL — ABNORMAL HIGH (ref 70–99)
Glucose-Capillary: 254 mg/dL — ABNORMAL HIGH (ref 70–99)
Glucose-Capillary: 208 mg/dL — ABNORMAL HIGH (ref 70–99)
Glucose-Capillary: 144 mg/dL — ABNORMAL HIGH (ref 70–99)

## 2022-01-12 MED ORDER — LORAZEPAM 0.5 MG PO TABS: 0.5000 mg | ORAL_TABLET | Freq: Every day | ORAL | 0 refills | Status: DC

## 2022-01-12 MED ORDER — METOLAZONE 5 MG PO TABS: 5.0000 mg | ORAL_TABLET | ORAL | 1 refills | Status: DC

## 2022-01-12 MED ORDER — OXYCODONE HCL 5 MG PO TABS: 2.5000 mg | ORAL_TABLET | Freq: Four times a day (QID) | ORAL | 0 refills | Status: DC | PRN

## 2022-01-12 MED ORDER — CALCIUM POLYCARBOPHIL 625 MG PO TABS: 625.0000 mg | ORAL_TABLET | Freq: Every day | ORAL | 2 refills | Status: DC

## 2022-01-12 MED ORDER — CARVEDILOL 12.5 MG PO TABS: 12.5000 mg | ORAL_TABLET | Freq: Two times a day (BID) | ORAL | 2 refills | Status: DC

## 2022-01-12 MED ORDER — ZINC OXIDE 40 % EX PSTE
1.0000 | PASTE | CUTANEOUS | 1 refills | Status: DC | PRN
Start: 2022-01-12 — End: 2023-11-10

## 2022-01-12 MED ORDER — ASPIRIN 81 MG PO TBEC: 81.0000 mg | DELAYED_RELEASE_TABLET | Freq: Every day | ORAL | 12 refills | Status: AC

## 2022-01-12 MED ORDER — LOPERAMIDE HCL 2 MG PO CAPS: 2.0000 mg | ORAL_CAPSULE | Freq: Every day | ORAL | 0 refills | Status: DC

## 2022-01-12 NOTE — TOC Progression Note (Signed)
Transition of Care [facility]) - Progression Note    Patient Details  Name: Bradley Sheppard MRN: [id number] Date of Birth: 12-17-1940  [facility]) CM/SW Contact  Boneta Lucks, RN Phone Number: 01/12/2022, 3:49 PM  Clinical Narrative:   [facility] made a bed offer, wife accepted this morning and INS AUTH started.  HTA just called for a peer to peer with Dr Lynder Parents  [phone] before noon tomorrow.  Daughter updated.   Expected Discharge Plan: Skilled Nursing Facility Barriers to Discharge: Insurance Authorization  Expected Discharge Plan and Services Expected Discharge Plan: [place]      Living arrangements for the past 2 months: Single Family Home Expected Discharge Date: 01/12/22                    Readmission Risk Interventions    01/10/2022    8:53 AM 01/01/2022    2:42 PM 01/01/2022    8:55 AM  Readmission Risk Prevention Plan  Transportation Screening Complete Complete Complete  PCP or Specialist Appt within 3-5 Days Not Complete  Complete  HRI or Home Care Consult Complete  Complete  Social Work Consult for [place] Planning/Counseling Complete  Complete  Palliative Care Screening Not Complete  Not Applicable  Medication Review Press photographer) Complete  Complete

## 2022-01-12 NOTE — Care Management Important Message (Signed)
Important Message  Patient Details  Name: Bradley Sheppard MRN: [id number] Date of Birth: 1940/07/28   Medicare Important Message Given:  Yes     Tommy Medal 01/12/2022, 12:27 PM

## 2022-01-12 NOTE — Progress Notes (Signed)
Physical Therapy Treatment Patient Details Name: Bradley Sheppard MRN: [id number] DOB: [birth year] Today's Date: 01/12/2022   History of Present Illness Bradley Sheppard  is a 81 y.o. 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 Patient reports increasing lower extremity edema especially the left lower extremity.... No pleuritic type symptoms, no significant calf pain, -No chest pains no palpitations  -Patient gives history of intermittent dizziness especially when he stands up  -He has been unable to lay flat sleeping in the recliner and hospital bed    PT Comments    Patient demonstrates slow labored movement for sitting up at bedside, had most difficulty completing sit to stands due to limited left knee flexion with stiff end feel, once standing limited to a few slow labored side steps with mostly shuffling of feet before having to sit due to weakness and poor standing balance.  Patient tolerated sitting up in chair after therapy with his spouse present in room - nursing staff aware.  Patient will benefit from continued skilled physical therapy in hospital and recommended venue below to increase strength, balance, endurance for safe ADLs and gait.      Recommendations for follow up therapy are one component of a multi-disciplinary discharge planning process, led by the attending physician.  Recommendations may be updated based on patient status, additional functional criteria and insurance authorization.  Follow Up Recommendations  Skilled nursing-short term rehab (<3 hours/day) Can patient physically be transported by private vehicle: No   Assistance Recommended at  Discharge    Patient can return home with the following Assistance with cooking/housework;Assist for transportation;Help with stairs or ramp for entrance;A lot of help with walking and/or transfers;A lot of help with bathing/dressing/bathroom   Equipment Recommendations  None recommended by PT    Recommendations for Other Services       Precautions / Restrictions Precautions Precautions: Fall Restrictions Weight Bearing Restrictions: No     Mobility  Bed Mobility Overal bed mobility: Needs Assistance Bed Mobility: Supine to Sit     Supine to sit: Mod assist, Min assist     General bed mobility comments: increased time, labored movement    Transfers Overall transfer level: Needs assistance Equipment used: Rolling walker (2 wheels) Transfers: Sit to/from Stand, Bed to chair/wheelchair/BSC Sit to Stand: Mod assist   Step pivot transfers: Mod assist       General transfer comment: very unsteady labored movement with diffiuclty flexing left knee due to stiffness    Ambulation/Gait Ambulation/Gait assistance: Mod assist, Max assist Gait Distance (Feet): 4 Feet Assistive device: Rolling walker (2 wheels) Gait Pattern/deviations: Decreased step length - right, Decreased step length - left, Decreased stance time - left, Antalgic, Shuffle Gait velocity: decreased     General Gait Details: limited to a few slow labored unsteady side steps with mostly shuffling for feet due to generalized weakness and poor standing balance   Stairs             Wheelchair Mobility    Modified Rankin (Stroke Patients Only)       Balance Overall balance assessment: Needs assistance Sitting-balance support: Feet supported, No upper extremity supported Sitting balance-Leahy Scale: Fair Sitting balance - Comments: fair/good seated at EOB  Standing balance support: During functional activity, Reliant on assistive device for balance, Bilateral upper extremity supported Standing  balance-Leahy Scale: Poor Standing balance comment: using RW                            Cognition Arousal/Alertness: Awake/alert Behavior During Therapy: WFL for tasks assessed/performed Overall Cognitive Status: Within Functional Limits for tasks assessed                                          Exercises      General Comments        Pertinent Vitals/Pain Pain Assessment Pain Assessment: Faces Faces Pain Scale: Hurts a little bit Pain Location: over buttocks Pain Descriptors / Indicators: Sore Pain Intervention(s): Limited activity within patient's tolerance, Monitored during session, Repositioned    Home Living                          Prior Function            PT Goals (current goals can now be found in the care plan section) Acute Rehab PT Goals Patient Stated Goal: return home PT Goal Formulation: With patient Time For Goal Achievement: 01/23/22 Potential to Achieve Goals: Good Progress towards PT goals: Progressing toward goals    Frequency    Min 3X/week      PT Plan Current plan remains appropriate    Co-evaluation              AM-PAC PT "6 Clicks" Mobility   Outcome Measure  Help needed turning from your back to your side while in a flat bed without using bedrails?: A Little Help needed moving from lying on your back to sitting on the side of a flat bed without using bedrails?: A Lot Help needed moving to and from a bed to a chair (including a wheelchair)?: A Lot Help needed standing up from a chair using your arms (e.g., wheelchair or bedside chair)?: A Lot Help needed to walk in hospital room?: A Lot Help needed climbing 3-5 steps with a railing? : Total 6 Click Score: 12    End of Session   Activity Tolerance: Patient tolerated treatment well;Patient limited by fatigue Patient left: in chair;with call bell/phone within reach;with family/visitor present Nurse Communication: Mobility status PT  Visit Diagnosis: Unsteadiness on feet (R26.81);Muscle weakness (generalized) (M62.81);Difficulty in walking, not elsewhere classified (R26.2);Other abnormalities of gait and mobility (R26.89)     Time: 1749-4496 PT Time Calculation (min) (ACUTE ONLY): 22 min  Charges:  $Therapeutic Activity: 8-22 mins                     2:37 PM, 01/12/22 Lonell Grandchild, MPT Physical Therapist with [facility] 336 [phone] office [phone] mobile phone

## 2022-01-12 NOTE — Discharge Summary (Signed)
Bradley Sheppard, is a 81 y.o. male  DOB 1940/05/23  MRN [id number].  Admission date:  01/08/2022  Admitting Physician  Sonda Coppens Denton Brick, MD  Discharge Date:  01/13/2022   Primary MD  [name], MD  Recommendations for primary care physician for things to follow:   1)Avoid ibuprofen/Advil/Aleve/Motrin/Goody Powders/Naproxen/BC powders/Meloxicam/Diclofenac/Indomethacin and other Nonsteroidal anti-inflammatory medications as these will make you more likely to bleed and can cause stomach ulcers, can also cause Kidney problems.   2) follow-up with Dr. Alwyn Pea from general surgery as previously advised if rectal bleeding continues  3) CBC and BMP blood test every Friday starting 01/16/2022 for 3 weeks  4) wound care consult for buttock pressure wounds/ulcers  5) take Lasix/furosemide 40 mg every morning  6) Take metolazone 5 mg 1 tablet every Tuesdays and Fridays  7)Silicone foam dressings to the  right buttock and sacrum change every 3 days. Assess under dressings each shift for any acute changes in the wounds.   If being soiled with bowel incontinence frequently please DC use and apply zinc based barrier cream instead  8) routine Foley catheter care--Please follow-up with Urologist Dr. Alyson Ingles in about --- for recheck and follow-up evaluation and possible removal of your Foley catheter with voiding trial in his office----[facility], [address], [place] [address], [place] [address] Phone Number----[phone]  Admission Diagnosis  Acute exacerbation of CHF (congestive heart failure) ([place]) [I50.9]   Discharge Diagnosis  Acute exacerbation of CHF (congestive heart failure) ([place]) [I50.9]   Principal Problem:   Acute exacerbation of  diastolic CHF (congestive heart failure) ([place]) Active Problems:   Moderate-Severe Aortic stenosis   DMII (diabetes mellitus, type 2) (HCC)   Decubitus  skin ulcer   MALT lymphoma (HCC)   Chronic kidney disease, stage 3b (HCC)      Past Medical History:  Diagnosis Date   (HFpEF) heart failure with preserved ejection fraction (HCC)    Anxiety    Aortic stenosis    Arthritis    Asthma    Chronic pain    CKD (chronic kidney disease) stage 3, GFR 30-59 ml/min (HCC)    Coronary atherosclerosis of native coronary artery    Multvessel, DES to LAD and RCA 8/03; EF 65% by echo 11/2014   DM2 (diabetes mellitus, type 2) (HCC)    Essential hypertension    Gastric ulcer    Gastritis    Related to NSAIDs   GERD (gastroesophageal reflux disease)    Iron deficiency anemia    Mitral valve regurgitation    Mixed hyperlipidemia    Myocardial infarction [facility])    Nephrolithiasis     Past Surgical History:  Procedure Laterality Date   APPENDECTOMY     BIOPSY  09/25/2021   Procedure: BIOPSY;  Surgeon: Daneil Dolin, MD;  Location: [facility];  Service: Endoscopy;;   CARDIAC CATHETERIZATION  01/2002   90% obstruction in proximal LAD, 60 % obstruction in proximal circumflex and 70%  in proximal RCA. LAD & RCA stented  stents x 2   COLONOSCOPY WITH PROPOFOL N/A 09/25/2021   Procedure: COLONOSCOPY WITH PROPOFOL;  Surgeon: Daneil Dolin, MD;  Location: [facility];  Service: Endoscopy;  Laterality: N/A;  11:00am   L knee replacement  2009   Subsequent infectino requiring resectino arthroplasty with incision and drainage 8/10, and reimplantizathion arthroplasty, 9/20. 5 total surgeries.   PARTIAL PROCTECTOMY BY TEM N/A 12/31/2021   Procedure: TEM PARTIAL PROTECTOMY;  Surgeon: Michael Boston, MD;  Location: [facility];  Service: General;  Laterality: N/A;     HPI  from the history and physical done on the day of admission:       Bradley Sheppard  is a 81 y.o. male who is a reformed smoker with past medical history relevant for DM 2, HTN, HLD,MALT lymphoma with chronic anemia, CAD , diastolic dysfunction CHF, aortic stenosis and status post partial  proctectomy by TEM (transanal endoscopic microsurgery) on 12/31/2021 who presents to the ED with worsening generalized weakness, orthopnea, dyspnea on exertion and ongoing rectal bleeding... Patient reports increasing lower extremity edema especially the left lower extremity.... No pleuritic type symptoms, no significant calf pain, -No chest pains no palpitations -Patient gives history of intermittent dizziness especially when he stands up -He has been unable to lay flat sleeping in the recliner and hospital bed -Additional history obtained from patient's wife and daughter at bedside No fever  Or chills  -Has a chronic indwelling Foley catheter but no dysuria -Chest x-ray with cardiomegaly and interstitial pulmonary edema -Lower extremity venous Dopplers without DVT -UA is not suggestive of UTI -BNP is elevated at 765 it was 177 in July 2023 -Hemoglobin is down to 8.1 it was 9.8 on 12/09/2021 -Platelets 285 -Sodium is 132 potassium is 4.0, creatinine is 1.79 baseline usually around 1.3-1.4    Hospital Course:   Brief Narrative:  80 y.o. 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 (transanal endoscopic microsurgery) on 12/31/2021 admitted on 01/08/2022 with acute CHF exacerbation as well as ongoing rectal bleeding and concerns for acute on chronic anemia     -Assessment and Plan: 1) HFpEF---- acute on chronic diastolic CHF exacerbation---POA -Patient presented with dyspnea on exertion, orthopnea, weight gain and increasing lower extremity edema --BNP is elevated at 765 it was 177 in July 2023 --Chest x-ray with cardiomegaly and interstitial pulmonary edema -Lower extremity venous Dopplers without DVT -Echo from 11/24/2021 shows LVEF 65 to 70% with grade 2 diastolic dysfunction, RV contraction normal.   -Treated with IV  Lasix -Overall improved -Weight was down to 195 pounds as of 01/12/22 from 214 pounds -Fluid balance negative  -Orthopnea and dyspnea improved -Okay to discharge on Lasix 40 mg daily metolazone 5 mg on Mondays and Fridays -BMP every Friday   2)CAD status post DES to the LAD and RCA in 2003--- no chest pains or ACS type symptoms -His cardiologist previously recommended medical management including  aspirin, Coreg, Crestor, and as needed nitroglycerin.   3)Moderate calcific aortic stenosis with mean gradient 18 mmHg and dimensionless index 0.44.   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 -creatinine on admission 1.79  baseline usually around 1.3-1.4 -Creatinine currently around 1.6-1.7 -renally adjust medications, avoid nephrotoxic agents / dehydration  / hypotension -Check  BMP every Friday   5)Acute on chronic symptomatic anemia--- at baseline patient has anemia related to lymphoma CKD 3B -Suspect some component of acute blood loss anemia due to recent rectal surgery and ongoing rectal bleeding --Hemoglobin is up to 8.9 from 8.1 admission after transfusion of 1 unit of PRBC It was previously Hgb 9.8 on 12/09/2021 Keep Hgb close to 9 due to CAD and CHF -Discussed with Dr. Alwyn Pea from general surgery service... Dr. Johney Maine agreed to  talk to patient and his wife on my speaker phone on 01/09/22... pt and wife seemed reassured -Dr. Johney Maine recommends transfusion of PRBC and IV iron as needed -He advises against surgical intervention at this time unless patient needs up to 6 units of PRBC--very high treshold as per Dr. Johney Maine for re-intervention...  -As per Dr. Johney Maine rectal bleeding may continue for 2 weeks or more after such procedure -Outpatient follow-up with Dr. Johney Maine advised -CBC every Friday advised - 6)Non-Hodgkin's  lymphoma/MALT lymphoma ([place]) - BMBX on 09/02/2021 showed NHL with differential highly likely MALT lymphoma -Continue to follow-up as outpatient with oncology   7)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 Cordelia Poche bleeding is slowed down according to patient and family -General surgeon told him to expect some degree of slight rectal oozing -Discussed with general surgeon Dr. Michael Boston.Marland Kitchen  He recommends Imodium, fiber supplements -Please see supplemental note from Dr. Johney Maine -Apparently pathology as per Dr. Johney Maine was without malignancy   8)DM2--recent A1c 7.3 reflecting uncontrolled DM with hyperglycemia -Restart PTA regimen including metformin glipizide and Actos   9)Urinary Retention--- Foley was placed on 12/31/2021 perioperatively  -Patient presented this admission with Foley catheter in situ--POA -Continue Flomax  -failed voiding trial on 01/11/22 -Outpatient follow-up with Dr. Alyson Ingles advised   10)Hyponatremia--- sodium was down to 132 suspect due to  hemodilution effect from CHF/volume overload -sodium normalized with diuresis   11)Generalized weakness and deconditioning----PT OT eval appreciated, recommends SNF rehab   12)Sacral/buttock decubitus ulcers/pressure injury--POA--- wound care consult appreciated -Please see photos in epic -Silicone foam dressings to the  right buttock and sacrum change every 3 days. Assess under dressings each shift for any acute changes in the wounds.   If being soiled with bowel incontinence frequently please DC use and apply zinc based barrier cream instead  13) irregular heartbeat--concerns about possible paroxysmal A-fib -Not a candidate for anticoagulation due to ongoing bleeding concerns -Echo from July 2023 with EF of 65 to 70% with grade 2 diastolic dysfunction, severely dilated left atrium and moderately dilated right atrium and moderate aortic valve stenosis -Continue Coreg for rate control CHF   Dispo: The patient  is from: Home              Anticipated d/c is to:   SNF    Code Status :  -  Code Status: Full Code    Family Communication:   Discussed with patient's wife and daughter at bedside   Discharge Condition: Stable  Follow UP   Contact information for after-discharge care     Destination     [facility] .   Service: Skilled Nursing Contact information: [address] [place] [phone]                   Diet and Activity recommendation:  As advised  Discharge Instructions    Discharge Instructions     Call MD for:  difficulty breathing,  headache or visual disturbances   Complete by: As directed    Call MD for:  persistant dizziness or light-headedness   Complete by: As directed    Call MD for:  persistant nausea and vomiting   Complete by: As directed    Call MD for:  temperature >100.4   Complete by: As directed    Diet - low sodium heart healthy   Complete by: As directed    Discharge instructions   Complete by: As directed    1)Avoid ibuprofen/Advil/Aleve/Motrin/Goody Powders/Naproxen/BC powders/Meloxicam/Diclofenac/Indomethacin and other Nonsteroidal anti-inflammatory medications as these will make you more likely to bleed and can cause stomach ulcers, can also cause Kidney problems.   2) follow-up with Dr. Alwyn Pea from general surgery as previously advised if rectal bleeding continues  3) CBC and BMP blood test every Friday starting 01/16/2022 for 3 weeks  4) wound care consult for buttock pressure wounds/ulcers  5) take Lasix/furosemide 40 mg every morning  6) Take metolazone 5 mg 1 tablet every Tuesdays and Fridays  7)Silicone foam dressings to the  right buttock and sacrum change every 3 days. Assess under dressings each shift for any acute changes in the wounds.   If being soiled with bowel incontinence frequently please DC use and apply zinc based barrier  cream instead  8) routine Foley catheter care--Please follow-up with Urologist Dr. Alyson Ingles in about --- for recheck and follow-up evaluation and possible removal of your Foley catheter with voiding trial in his office----[facility], [address] [place] [address], [place] [address] Phone Number----[phone]   Discharge wound care:   Complete by: As directed    Silicone foam dressings to the  right buttock and sacrum change every 3 days. Assess under dressings each shift for any acute changes in the wounds.   If being soiled with bowel incontinence frequently please DC use and apply zinc based barrier cream instead   Increase activity slowly   Complete by: As directed        Discharge Medications    Allergies as of 01/13/2022       Reactions   Nsaids Other (See Comments)   On blood thinners with h/o IDA/bleeding   Prednisone Other (See Comments)   "makes him feel faint", hyperglycemic         Medication List     TAKE these medications    acetaminophen 500 MG tablet Commonly known as: TYLENOL Take 1,000 mg by mouth every 6 (six) hours as needed for moderate pain.   ascorbic acid 500 MG tablet Commonly known as: VITAMIN C Take 500 mg by mouth at bedtime.   aspirin EC 81 MG tablet Take 1 tablet (81 mg total) by mouth daily with breakfast. Swallow whole. What changed: when to take this   carvedilol 12.5 MG tablet Commonly known as: COREG Take 1 tablet (12.5 mg total) by mouth 2 (two) times daily with a meal.   cyanocobalamin 500 MCG tablet Commonly known as: VITAMIN B12 Take 500 mcg by mouth daily.   ferrous sulfate 324 MG Tbec Take 324 mg by mouth 2 (two) times daily.   furosemide 40 MG tablet Commonly known as: LASIX Take 1 tablet (40 mg total) by mouth daily.   glipiZIDE 10 MG 24 hr tablet Commonly known as: GLUCOTROL XL Take 10 mg by mouth 2 (two) times daily.   loperamide 2 MG capsule Commonly known as: IMODIUM Take 1 capsule (2 mg total)  by mouth at bedtime.   LORazepam 0.5  MG tablet Commonly known as: ATIVAN Take 1 tablet (0.5 mg total) by mouth at bedtime. What changed:  medication strength how much to take   metFORMIN 500 MG tablet Commonly known as: GLUCOPHAGE Take 500-1,000 mg by mouth See admin instructions. Take 1000 mg in the morning and 500 mg at night   metolazone 5 MG tablet Commonly known as: ZAROXOLYN Take 1 tablet (5 mg total) by mouth See admin instructions. Take 1 every Tuesdays and Fridays   nitroGLYCERIN 0.4 MG SL tablet Commonly known as: NITROSTAT Place 1 tablet (0.4 mg total) under the tongue every 5 (five) minutes as needed.   oxyCODONE 5 MG immediate release tablet Commonly known as: Oxy IR/ROXICODONE Take 0.5-1 tablets (2.5-5 mg total) by mouth every 6 (six) hours as needed for moderate pain, severe pain or breakthrough pain.   pioglitazone 45 MG tablet Commonly known as: ACTOS Take 45 mg by mouth daily.   polycarbophil 625 MG tablet Commonly known as: FIBERCON Take 1 tablet (625 mg total) by mouth daily.   rosuvastatin 5 MG tablet Commonly known as: CRESTOR Take 5 mg by mouth every Wednesday.   tamsulosin 0.4 MG Caps capsule Commonly known as: Flomax Take 1 capsule (0.4 mg total) by mouth daily.   tiZANidine 2 MG tablet Commonly known as: ZANAFLEX Take 2 mg by mouth 2 (two) times daily.   Zinc Oxide 40 % Pste Apply 1 Application topically as needed. Apply to buttock and sacrum if silicone foam is not effective due to incontinence               Discharge Care Instructions  (From admission, onward)           Start     Ordered   01/12/22 0000  Discharge wound care:       Comments: Silicone foam dressings to the  right buttock and sacrum change every 3 days. Assess under dressings each shift for any acute changes in the wounds.   If being soiled with bowel incontinence frequently please DC use and apply zinc based barrier cream instead   01/12/22 1521            Major procedures and Radiology Reports - PLEASE review detailed and final reports for all details, in brief -   DG Chest 2 View  Result Date: 01/11/2022 CLINICAL DATA:  Dyspnea EXAM: CHEST - 2 VIEW COMPARISON:  01/08/2022 FINDINGS: Stable cardiomegaly. Aortic atherosclerosis. Mild diffuse interstitial prominence, similar to prior. No pleural effusion or pneumothorax. IMPRESSION: Cardiomegaly and mild edema, similar to prior. Electronically Signed   By: Davina Poke D.O.   On: 01/11/2022 11:29   DG Chest Port 1 View  Result Date: 01/08/2022 CLINICAL DATA:  Weakness, left leg swelling EXAM: PORTABLE CHEST 1 VIEW COMPARISON:  Radiograph 11/27/2021 FINDINGS: Unchanged enlarged cardiac silhouette. Aortic arch calcifications. There are mild diffuse interstitial opacities. There is no pleural effusion. No pneumothorax. Thoracic spondylosis with dextroconvex curvature. Severe bilateral glenohumeral osteoarthritis. IMPRESSION: Cardiomegaly with interstitial pulmonary edema. Electronically Signed   By: Maurine Simmering M.D.   On: 01/08/2022 12:04   US Venous Img Lower Bilateral (DVT)  Result Date: 01/08/2022 CLINICAL DATA:  Bilateral lower extremity edema for 1 month. EXAM: Bilateral LOWER EXTREMITY VENOUS DOPPLER ULTRASOUND TECHNIQUE: Gray-scale sonography with compression, as well as color and duplex ultrasound, were performed to evaluate the deep venous system(s) from the level of the common femoral vein through the popliteal and proximal calf veins. COMPARISON:  No recent comparison imaging. Imaging from  2009 is available. FINDINGS: VENOUS Normal compressibility of the common femoral, superficial femoral, and popliteal veins, as well as the visualized calf veins. Visualized portions of profunda femoral vein and great saphenous vein unremarkable. No filling defects to suggest DVT on grayscale or color Doppler imaging. Doppler waveforms show normal direction of venous flow, normal respiratory plasticity  and response to augmentation. OTHER Edema in the lower leg bilaterally. Limitations: Limited assessment of bilateral calf veins due to presence of lower extremity, lower leg edema. IMPRESSION: Negative for DVT. Mildly limited with respect to calf vein assessment due to lower leg edema. Electronically Signed   By: Zetta Bills M.D.   On: 01/08/2022 11:45    Micro Results   Recent Results (from the past 240 hour(s))  Urine Culture     Status: None   Collection Time: 01/08/22 11:07 AM   Specimen: Urine, Catheterized  Result Value Ref Range Status   Specimen Description   Final    URINE, CATHETERIZED Performed at [facility], [address], [place], [place] [address]    Special Requests   Final    NONE Performed at [facility], [address] [place], [place] [address]    Culture   Final    NO GROWTH Performed at [facility], [place] [address], [place], [place] [address]    Report Status 01/09/2022 FINAL  Final   Today   Subjective    Conway Fedora today has no new concerns -Foley catheter draining well No fever  Or chills  -No significant rectal bleeding No vomiting, no abdominal pain -No chest pain and no dyspnea at rest -Fatigue, generalized weakness persist     - 01/13/22 Conference at bedside with patient's wife and patient's daughter - Patient was initially discharged to SNF on 01/12/2022 -He was unable to leave due to delay with insurance approval -Peer to peer review with Dr Lynder Parents on 01/13/22...Marland KitchenMarland KitchenMarland Kitchenpt has been approved for SNF rehab  - Patient with recurrent hospitalization lately and ongoing overall decline........ -Palliative care consult for further delineation of goals of care requested - Patient remains medically stable for transfer to SNF rehab as of 01/13/2022 - Please see full discharge summary previously dictated on 01/12/2022 and addended/updated on 01/13/2022      Patient has been seen and examined prior to discharge   Objective   Blood  pressure 119/63, pulse 77, temperature 97.8 F (36.6 C), temperature source Oral, resp. rate 18, height 6' (1.829 m), weight 90.7 kg, SpO2 97 %.   Intake/Output Summary (Last 24 hours) at 01/13/2022 1109 Last data filed at 01/13/2022 0500 Gross per 24 hour  Intake 600 ml  Output 1200 ml  Net -600 ml   Exam Gen:- Awake Alert,  in no apparent distress  HEENT:- [place].AT, No sclera icterus Neck-Supple Neck,No JVD,.  Lungs-improved air movement, no wheezing  CV- S1, S2 normal, regular , 3/6 SM Abd-  +ve B.Sounds, Abd Soft, No tenderness,    Extremities -1+ pedal edema noted, Lt > Rt, intact peripheral pulses  Skin -sacral and buttock decubitus ulcers--see Photos in Epic Psych-affect is appropriate, oriented x3 Neuro-generalized weakness,  no new focal deficits, no tremors GU- foley with clear urine--failed voiding trial on 01/11/22   Data Review   CBC w Diff:  Lab Results  Component Value Date   WBC 8.1 01/13/2022   HGB 8.9 (L) 01/13/2022   HGB 9.8 (L) 12/09/2021   HCT 27.0 (L) 01/13/2022   HCT 30.0 (L) 12/09/2021   PLT  410 (H) 01/13/2022   PLT 498 (H) 12/09/2021   LYMPHOPCT 25 01/08/2022   MONOPCT 9 01/08/2022   EOSPCT 2 01/08/2022   BASOPCT 0 01/08/2022    CMP:  Lab Results  Component Value Date   NA 132 (L) 01/13/2022   NA 143 12/09/2021   K 4.7 01/13/2022   CL 97 (L) 01/13/2022   CO2 25 01/13/2022   BUN 55 (H) 01/13/2022   BUN 36 (H) 12/09/2021   CREATININE 1.91 (H) 01/13/2022   PROT 7.5 12/09/2021   ALBUMIN 2.3 (L) 01/10/2022   ALBUMIN 3.7 12/09/2021   BILITOT 0.4 12/09/2021   ALKPHOS 118 12/09/2021   AST 34 12/09/2021   ALT 45 (H) 12/09/2021  .  Total Discharge time is about 33 minutes  Roxan Hockey M.D on 01/13/2022 at 11:09 AM  Go to [email or web address] -  for contact info  [facility] - Office  [phone]

## 2022-01-12 NOTE — Discharge Instructions (Addendum)
1)Avoid ibuprofen/Advil/Aleve/Motrin/Goody Powders/Naproxen/BC powders/Meloxicam/Diclofenac/Indomethacin and other Nonsteroidal anti-inflammatory medications as these will make you more likely to bleed and can cause stomach ulcers, can also cause Kidney problems.   2) follow-up with Dr. Alwyn Pea from general surgery as previously advised if rectal bleeding continues  3) CBC and BMP blood test every Friday starting 01/16/2022 for 3 weeks  4) wound care consult for buttock pressure wounds/ulcers  5) take Lasix/furosemide 40 mg every morning  6) Take metolazone 5 mg 1 tablet every Tuesdays and Fridays  7)Silicone foam dressings to the  right buttock and sacrum change every 3 days. Assess under dressings each shift for any acute changes in the wounds.   If being soiled with bowel incontinence frequently please DC use and apply zinc based barrier cream instead  8) routine Foley catheter care--Please follow-up with Urologist Dr. Alyson Ingles in about --- for recheck and follow-up evaluation and possible removal of your Foley catheter with voiding trial in his office----[facility], [address], [place] [address], [place] [address] Phone Number----[phone]

## 2022-01-13 ENCOUNTER — Encounter (HOSPITAL_COMMUNITY): Payer: Self-pay | Admitting: Family Medicine

## 2022-01-13 DIAGNOSIS — J90 Pleural effusion, not elsewhere classified: Secondary | ICD-10-CM | POA: Diagnosis not present

## 2022-01-13 DIAGNOSIS — J849 Interstitial pulmonary disease, unspecified: Secondary | ICD-10-CM | POA: Diagnosis not present

## 2022-01-13 DIAGNOSIS — A4150 Gram-negative sepsis, unspecified: Secondary | ICD-10-CM | POA: Diagnosis not present

## 2022-01-13 DIAGNOSIS — E1140 Type 2 diabetes mellitus with diabetic neuropathy, unspecified: Secondary | ICD-10-CM | POA: Diagnosis not present

## 2022-01-13 DIAGNOSIS — E871 Hypo-osmolality and hyponatremia: Secondary | ICD-10-CM | POA: Diagnosis not present

## 2022-01-13 DIAGNOSIS — Z299 Encounter for prophylactic measures, unspecified: Secondary | ICD-10-CM | POA: Diagnosis not present

## 2022-01-13 DIAGNOSIS — F1320 Sedative, hypnotic or anxiolytic dependence, uncomplicated: Secondary | ICD-10-CM | POA: Diagnosis not present

## 2022-01-13 DIAGNOSIS — I4891 Unspecified atrial fibrillation: Secondary | ICD-10-CM | POA: Diagnosis not present

## 2022-01-13 DIAGNOSIS — A419 Sepsis, unspecified organism: Secondary | ICD-10-CM | POA: Diagnosis not present

## 2022-01-13 DIAGNOSIS — Z955 Presence of coronary angioplasty implant and graft: Secondary | ICD-10-CM | POA: Diagnosis not present

## 2022-01-13 DIAGNOSIS — Z960 Presence of urogenital implants: Secondary | ICD-10-CM | POA: Diagnosis not present

## 2022-01-13 DIAGNOSIS — N1832 Chronic kidney disease, stage 3b: Secondary | ICD-10-CM | POA: Diagnosis not present

## 2022-01-13 DIAGNOSIS — L89309 Pressure ulcer of unspecified buttock, unspecified stage: Secondary | ICD-10-CM | POA: Diagnosis not present

## 2022-01-13 DIAGNOSIS — J45909 Unspecified asthma, uncomplicated: Secondary | ICD-10-CM | POA: Diagnosis not present

## 2022-01-13 DIAGNOSIS — A4151 Sepsis due to Escherichia coli [E. coli]: Secondary | ICD-10-CM | POA: Diagnosis not present

## 2022-01-13 DIAGNOSIS — K631 Perforation of intestine (nontraumatic): Secondary | ICD-10-CM | POA: Diagnosis not present

## 2022-01-13 DIAGNOSIS — C8590 Non-Hodgkin lymphoma, unspecified, unspecified site: Secondary | ICD-10-CM | POA: Diagnosis not present

## 2022-01-13 DIAGNOSIS — I10 Essential (primary) hypertension: Secondary | ICD-10-CM | POA: Diagnosis not present

## 2022-01-13 DIAGNOSIS — M25562 Pain in left knee: Secondary | ICD-10-CM | POA: Diagnosis not present

## 2022-01-13 DIAGNOSIS — I5033 Acute on chronic diastolic (congestive) heart failure: Secondary | ICD-10-CM | POA: Diagnosis not present

## 2022-01-13 DIAGNOSIS — D649 Anemia, unspecified: Secondary | ICD-10-CM | POA: Diagnosis not present

## 2022-01-13 DIAGNOSIS — D631 Anemia in chronic kidney disease: Secondary | ICD-10-CM | POA: Diagnosis not present

## 2022-01-13 DIAGNOSIS — K5730 Diverticulosis of large intestine without perforation or abscess without bleeding: Secondary | ICD-10-CM | POA: Diagnosis not present

## 2022-01-13 DIAGNOSIS — R339 Retention of urine, unspecified: Secondary | ICD-10-CM | POA: Diagnosis not present

## 2022-01-13 DIAGNOSIS — L89152 Pressure ulcer of sacral region, stage 2: Secondary | ICD-10-CM | POA: Diagnosis not present

## 2022-01-13 DIAGNOSIS — Z515 Encounter for palliative care: Secondary | ICD-10-CM

## 2022-01-13 DIAGNOSIS — N189 Chronic kidney disease, unspecified: Secondary | ICD-10-CM | POA: Diagnosis not present

## 2022-01-13 DIAGNOSIS — R5381 Other malaise: Secondary | ICD-10-CM | POA: Diagnosis not present

## 2022-01-13 DIAGNOSIS — I5040 Unspecified combined systolic (congestive) and diastolic (congestive) heart failure: Secondary | ICD-10-CM | POA: Diagnosis not present

## 2022-01-13 DIAGNOSIS — I700 Atherosclerosis of aorta: Secondary | ICD-10-CM | POA: Diagnosis not present

## 2022-01-13 DIAGNOSIS — M62838 Other muscle spasm: Secondary | ICD-10-CM | POA: Diagnosis not present

## 2022-01-13 DIAGNOSIS — Y846 Urinary catheterization as the cause of abnormal reaction of the patient, or of later complication, without mention of misadventure at the time of the procedure: Secondary | ICD-10-CM | POA: Diagnosis present

## 2022-01-13 DIAGNOSIS — I5043 Acute on chronic combined systolic (congestive) and diastolic (congestive) heart failure: Secondary | ICD-10-CM | POA: Diagnosis not present

## 2022-01-13 DIAGNOSIS — L89312 Pressure ulcer of right buttock, stage 2: Secondary | ICD-10-CM | POA: Diagnosis not present

## 2022-01-13 DIAGNOSIS — Z7189 Other specified counseling: Secondary | ICD-10-CM | POA: Diagnosis not present

## 2022-01-13 DIAGNOSIS — K625 Hemorrhage of anus and rectum: Secondary | ICD-10-CM | POA: Diagnosis not present

## 2022-01-13 DIAGNOSIS — E1122 Type 2 diabetes mellitus with diabetic chronic kidney disease: Secondary | ICD-10-CM | POA: Diagnosis not present

## 2022-01-13 DIAGNOSIS — D472 Monoclonal gammopathy: Secondary | ICD-10-CM | POA: Diagnosis not present

## 2022-01-13 DIAGNOSIS — R69 Illness, unspecified: Secondary | ICD-10-CM | POA: Diagnosis not present

## 2022-01-13 DIAGNOSIS — Z743 Need for continuous supervision: Secondary | ICD-10-CM | POA: Diagnosis not present

## 2022-01-13 DIAGNOSIS — N390 Urinary tract infection, site not specified: Secondary | ICD-10-CM | POA: Diagnosis not present

## 2022-01-13 DIAGNOSIS — I252 Old myocardial infarction: Secondary | ICD-10-CM | POA: Diagnosis not present

## 2022-01-13 DIAGNOSIS — I130 Hypertensive heart and chronic kidney disease with heart failure and stage 1 through stage 4 chronic kidney disease, or unspecified chronic kidney disease: Secondary | ICD-10-CM | POA: Diagnosis not present

## 2022-01-13 DIAGNOSIS — I080 Rheumatic disorders of both mitral and aortic valves: Secondary | ICD-10-CM | POA: Diagnosis not present

## 2022-01-13 DIAGNOSIS — N139 Obstructive and reflux uropathy, unspecified: Secondary | ICD-10-CM | POA: Diagnosis not present

## 2022-01-13 DIAGNOSIS — E782 Mixed hyperlipidemia: Secondary | ICD-10-CM | POA: Diagnosis not present

## 2022-01-13 DIAGNOSIS — R627 Adult failure to thrive: Secondary | ICD-10-CM | POA: Diagnosis not present

## 2022-01-13 DIAGNOSIS — N1831 Chronic kidney disease, stage 3a: Secondary | ICD-10-CM | POA: Diagnosis not present

## 2022-01-13 DIAGNOSIS — I350 Nonrheumatic aortic (valve) stenosis: Secondary | ICD-10-CM | POA: Diagnosis not present

## 2022-01-13 DIAGNOSIS — R319 Hematuria, unspecified: Secondary | ICD-10-CM | POA: Diagnosis present

## 2022-01-13 DIAGNOSIS — T83511A Infection and inflammatory reaction due to indwelling urethral catheter, initial encounter: Secondary | ICD-10-CM | POA: Diagnosis not present

## 2022-01-13 DIAGNOSIS — D62 Acute posthemorrhagic anemia: Secondary | ICD-10-CM | POA: Diagnosis not present

## 2022-01-13 DIAGNOSIS — G8929 Other chronic pain: Secondary | ICD-10-CM | POA: Diagnosis not present

## 2022-01-13 DIAGNOSIS — N3091 Cystitis, unspecified with hematuria: Secondary | ICD-10-CM | POA: Diagnosis not present

## 2022-01-13 DIAGNOSIS — N3090 Cystitis, unspecified without hematuria: Secondary | ICD-10-CM | POA: Diagnosis not present

## 2022-01-13 DIAGNOSIS — Z66 Do not resuscitate: Secondary | ICD-10-CM | POA: Diagnosis not present

## 2022-01-13 DIAGNOSIS — T83518A Infection and inflammatory reaction due to other urinary catheter, initial encounter: Secondary | ICD-10-CM | POA: Diagnosis not present

## 2022-01-13 DIAGNOSIS — N179 Acute kidney failure, unspecified: Secondary | ICD-10-CM | POA: Diagnosis not present

## 2022-01-13 DIAGNOSIS — E1165 Type 2 diabetes mellitus with hyperglycemia: Secondary | ICD-10-CM | POA: Diagnosis not present

## 2022-01-13 DIAGNOSIS — L89159 Pressure ulcer of sacral region, unspecified stage: Secondary | ICD-10-CM | POA: Diagnosis not present

## 2022-01-13 DIAGNOSIS — E222 Syndrome of inappropriate secretion of antidiuretic hormone: Secondary | ICD-10-CM | POA: Diagnosis not present

## 2022-01-13 DIAGNOSIS — I5032 Chronic diastolic (congestive) heart failure: Secondary | ICD-10-CM | POA: Diagnosis not present

## 2022-01-13 DIAGNOSIS — C884 Extranodal marginal zone B-cell lymphoma of mucosa-associated lymphoid tissue [MALT-lymphoma]: Secondary | ICD-10-CM | POA: Diagnosis not present

## 2022-01-13 DIAGNOSIS — I480 Paroxysmal atrial fibrillation: Secondary | ICD-10-CM | POA: Diagnosis not present

## 2022-01-13 DIAGNOSIS — L8945 Pressure ulcer of contiguous site of back, buttock and hip, unstageable: Secondary | ICD-10-CM | POA: Diagnosis not present

## 2022-01-13 DIAGNOSIS — M25561 Pain in right knee: Secondary | ICD-10-CM | POA: Diagnosis not present

## 2022-01-13 DIAGNOSIS — K6289 Other specified diseases of anus and rectum: Secondary | ICD-10-CM | POA: Diagnosis not present

## 2022-01-13 DIAGNOSIS — M179 Osteoarthritis of knee, unspecified: Secondary | ICD-10-CM | POA: Diagnosis not present

## 2022-01-13 DIAGNOSIS — T83511D Infection and inflammatory reaction due to indwelling urethral catheter, subsequent encounter: Secondary | ICD-10-CM | POA: Diagnosis not present

## 2022-01-13 DIAGNOSIS — I2510 Atherosclerotic heart disease of native coronary artery without angina pectoris: Secondary | ICD-10-CM | POA: Diagnosis not present

## 2022-01-13 LAB — CBC
HCT: 27 % — ABNORMAL LOW (ref 39.0–52.0)
Hemoglobin: 8.9 g/dL — ABNORMAL LOW (ref 13.0–17.0)
MCH: 31.2 pg (ref 26.0–34.0)
MCHC: 33 g/dL (ref 30.0–36.0)
MCV: 94.7 fL (ref 80.0–100.0)
Platelets: 410 10*3/uL — ABNORMAL HIGH (ref 150–400)
RBC: 2.85 MIL/uL — ABNORMAL LOW (ref 4.22–5.81)
RDW: 13.9 % (ref 11.5–15.5)
WBC: 8.1 10*3/uL (ref 4.0–10.5)
nRBC: 0 % (ref 0.0–0.2)

## 2022-01-13 LAB — BASIC METABOLIC PANEL
Anion gap: 10 (ref 5–15)
BUN: 55 mg/dL — ABNORMAL HIGH (ref 8–23)
CO2: 25 mmol/L (ref 22–32)
Calcium: 8.3 mg/dL — ABNORMAL LOW (ref 8.9–10.3)
Chloride: 97 mmol/L — ABNORMAL LOW (ref 98–111)
Creatinine, Ser: 1.91 mg/dL — ABNORMAL HIGH (ref 0.61–1.24)
GFR, Estimated: 34 mL/min — ABNORMAL LOW (ref >60)
Glucose, Bld: 155 mg/dL — ABNORMAL HIGH (ref 70–99)
Potassium: 4.7 mmol/L (ref 3.5–5.1)
Sodium: 132 mmol/L — ABNORMAL LOW (ref 135–145)

## 2022-01-13 LAB — GLUCOSE, CAPILLARY
Glucose-Capillary: 157 mg/dL — ABNORMAL HIGH (ref 70–99)
Glucose-Capillary: 192 mg/dL — ABNORMAL HIGH (ref 70–99)

## 2022-01-13 NOTE — Consult Note (Signed)
Consultation Note Date: 01/13/2022   Patient Name: Bradley Sheppard  DOB: 1940/12/24  MRN: [id number]  Age / Sex: 81 y.o., male  PCP: [name], MD Referring Physician: No att. providers found  Reason for Consultation: Establishing goals of care  HPI/Patient Profile: 81 y.o. male  with past medical history of HFpEF, aortic stenosis, CKD 3, CAD, arthritis, asthma, gastritis, gastric ulcer, HTN/HLD, admitted on 01/08/2022 with acute on chronic heart failure exacerbation.   Clinical Assessment and Goals of Care: I have reviewed medical records including EPIC notes, labs and imaging, received report from RN, assessed the patient.  Bradley Sheppard is sitting up in bed eating lunch.  He is alert, able to make his needs known.  His wife of 29 years is present at bedside.  We meet at bedside to discuss diagnosis prognosis, GOC, EOL wishes, disposition and options.  I introduced Palliative Medicine as specialized medical care for people living with serious illness. It focuses on providing relief from the symptoms and stress of a serious illness. The goal is to improve quality of life for both the patient and the family.  We discussed a brief life review of the patient.  Mr. and Mrs. Robyn Sheppard has been married for 60 years.  He is retired from [employer].    We briefly focused on their acute illness and the treatment plan.  We talk about short-term rehab and I encourage Bradley Sheppard to work closely with Education officer, museum at site for further needs.    Hospice and Palliative Care services outpatient were explained and offered.We talk in detail about the differences between palliative and hospice care.  Bradley Sheppard are agreeable to outpatient palliative services with hospice of [facility].  They are reassured that this is not hospice care.    Discussed the importance of continued conversation with family and the  medical providers regarding overall plan of care and treatment options, ensuring decisions are within the context of the patient's values and GOCs.  Questions and concerns were addressed.  The patient and family was encouraged to call with questions or concerns.  PMT will continue to support holistically.  Conference with attending, bedside nursing staff, transition of care team related to patient condition, needs, goals of care, disposition.    HCPOA  NEXT OF KIN -wife of 79 years, Bradley Sheppard.    SUMMARY OF RECOMMENDATIONS   At this point continue to treat the treatable Time for outcomes Short term rehab Outpatient palliative services with hosp[facility] of [place] Planning: Full code  Symptom Management:  Per hospitalist, no additional needs at this time.  Palliative Prophylaxis:  Oral Care and Turn Reposition  Additional Recommendations (Limitations, Scope, Preferences): Full Scope Treatment  Psycho-social/Spiritual:  Desire for further Chaplaincy support:no Additional Recommendations: Caregiving  Support/Resources and Education on Hospice  Prognosis:  Unable to determine, based on outcomes.  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 The following aspects are pertinent.  Past Medical History:  Diagnosis Date   (HFpEF) heart failure with preserved ejection fraction (HCC)    Anxiety    Aortic stenosis    Arthritis    Asthma    Chronic pain    CKD (chronic kidney disease) stage 3, GFR 30-59 ml/min (HCC)    Coronary atherosclerosis of native coronary artery    Multvessel, DES to LAD and RCA 8/03; EF 65% by echo 11/2014   DM2 (diabetes mellitus, type  2) (HCC)    Essential hypertension    Gastric ulcer    Gastritis    Related to NSAIDs   GERD (gastroesophageal reflux disease)    Iron deficiency anemia    Mitral valve regurgitation    Mixed hyperlipidemia    Myocardial infarction ([place])    Nephrolithiasis    Social History   Socioeconomic History   Marital status: Married    Spouse name: Not on file   Number of children: Not on file   Years of education: Not on file   Highest education level: Not on file  Occupational History   Not on file  Tobacco Use   Smoking status: Former    Types: Cigarettes    Passive exposure: Never   Smokeless tobacco: Current    Types: Chew  Vaping Use   Vaping Use: Never used  Substance and Sexual Activity   Alcohol use: No   Drug use: No   Sexual activity: Not Currently  Other Topics Concern   Not on file  Social History Narrative   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         Social Determinants of Health   Financial Resource Strain: Not on file  Food Insecurity: No Food Insecurity (01/08/2022)   Hunger Vital Sign    Worried About Running Out of Food in the Last Year: Never true    Ran Out of Food in the Last Year: Never true  Transportation Needs: No Transportation Needs (01/08/2022)   PRAPARE - Hydrologist (Medical): No    Lack of Transportation (Non-Medical): No  Physical Activity: Not on file  Stress: Not on file  Social Connections: Not on file   Family History  Problem Relation Age of Onset   Diabetes Mother    Aneurysm Mother        Brain   Stroke Father    Stroke Other    Colon cancer Neg Hx    Scheduled Meds:  sodium chloride   Intravenous Once   ascorbic acid  500 mg Oral QHS   carvedilol  12.5 mg Oral BID WC   Chlorhexidine Gluconate Cloth  6 each Topical Daily   ferrous sulfate  325 mg Oral BID   furosemide  40 mg Intravenous q AM   insulin aspart  0-15 Units Subcutaneous TID WC   insulin aspart  0-5 Units Subcutaneous QHS   loperamide  2 mg  Oral QHS   LORazepam  0.5 mg Oral QHS   polycarbophil  625 mg Oral BID   [START ON 01/14/2022] rosuvastatin  5 mg Oral Q Wed   sodium chloride flush  3 mL Intravenous Q12H   sodium chloride flush  3 mL Intravenous Q12H   tamsulosin  0.4 mg Oral BID   tiZANidine  2  mg Oral QHS   traZODone  50 mg Oral QHS   cyanocobalamin  500 mcg Oral Daily   Continuous Infusions:  sodium chloride     PRN Meds:.sodium chloride, bisacodyl, loperamide, ondansetron **OR** ondansetron (ZOFRAN) IV, oxyCODONE, sodium chloride flush, Zinc Oxide Medications Prior to Admission:  Prior to Admission medications   Medication Sig Start Date End Date Taking? Authorizing Provider  acetaminophen (TYLENOL) 500 MG tablet Take 1,000 mg by mouth every 6 (six) hours as needed for moderate pain.   Yes [provider]  ferrous sulfate 324 MG TBEC Take 324 mg by mouth 2 (two) times daily.   Yes [provider]  furosemide (LASIX) 40 MG tablet Take 1 tablet (40 mg total) by mouth daily. 12/02/21  Yes Tat, Shanon Brow, MD  glipiZIDE (GLUCOTROL XL) 10 MG 24 hr tablet Take 10 mg by mouth 2 (two) times daily. 12/19/20  Yes [provider]  metFORMIN (GLUCOPHAGE) 500 MG tablet Take 500-1,000 mg by mouth See admin instructions. Take 1000 mg in the morning and 500 mg at night 08/16/21  Yes [provider]  metolazone (ZAROXOLYN) 5 MG tablet Take 1 tablet (5 mg total) by mouth See admin instructions. Take 1 every Tuesdays and Fridays 01/12/22  Yes Emokpae, Courage, MD  pioglitazone (ACTOS) 45 MG tablet Take 45 mg by mouth daily.   Yes [provider]  rosuvastatin (CRESTOR) 5 MG tablet Take 5 mg by mouth every Wednesday. 12/02/21  Yes [provider]  tamsulosin (FLOMAX) 0.4 MG CAPS capsule Take 1 capsule (0.4 mg total) by mouth daily. 01/02/22  Yes Michael Boston, MD  tiZANidine (ZANAFLEX) 2 MG tablet Take 2 mg by mouth 2 (two) times daily.   Yes [provider]  vitamin B-12 (CYANOCOBALAMIN)  500 MCG tablet Take 500 mcg by mouth daily.   Yes [provider]  vitamin C (ASCORBIC ACID) 500 MG tablet Take 500 mg by mouth at bedtime.   Yes [provider]  aspirin EC 81 MG tablet Take 1 tablet (81 mg total) by mouth daily with breakfast. Swallow whole. 01/12/22   Roxan Hockey, MD  carvedilol (COREG) 12.5 MG tablet Take 1 tablet (12.5 mg total) by mouth 2 (two) times daily with a meal. 01/12/22   Emokpae, Courage, MD  loperamide (IMODIUM) 2 MG capsule Take 1 capsule (2 mg total) by mouth at bedtime. 01/12/22   Roxan Hockey, MD  LORazepam (ATIVAN) 0.5 MG tablet Take 1 tablet (0.5 mg total) by mouth at bedtime. 01/12/22   Roxan Hockey, MD  nitroGLYCERIN (NITROSTAT) 0.4 MG SL tablet Place 1 tablet (0.4 mg total) under the tongue every 5 (five) minutes as needed. 06/30/21   Satira Sark, MD  oxyCODONE (OXY IR/ROXICODONE) 5 MG immediate release tablet Take 0.5-1 tablets (2.5-5 mg total) by mouth every 6 (six) hours as needed for moderate pain, severe pain or breakthrough pain. 01/12/22   Roxan Hockey, MD  polycarbophil (FIBERCON) 625 MG tablet Take 1 tablet (625 mg total) by mouth daily. 01/12/22   Roxan Hockey, MD  Zinc Oxide 40 % PSTE Apply 1 Application topically as needed. Apply to buttock and sacrum if silicone foam is not effective due to incontinence 01/12/22   Roxan Hockey, MD   Allergies  Allergen Reactions   Nsaids Other (See Comments)    On blood thinners with h/o IDA/bleeding   Prednisone Other (See Comments)    "makes him feel faint", hyperglycemic    Review of Systems  Unable to perform ROS:  Age    Physical Exam Vitals and nursing note reviewed.  Cardiovascular:     Rate and Rhythm: Normal rate.  Pulmonary:     Effort: Pulmonary effort is normal. No respiratory distress.  Skin:    General: Skin is warm and dry.  Neurological:     Mental Status: He is alert.  Psychiatric:        Mood and Affect: Mood normal.        Behavior:  Behavior normal.     Vital Signs: BP 119/63 (BP Location: Right Arm)   Pulse 77   Temp 97.8 F (36.6 C) (Oral)   Resp 18   Ht 6' (1.829 m)   Wt 90.7 kg Comment: removed SCDs,foley, and tely moniter  SpO2 97%   BMI 27.12 kg/m  Pain Scale: 0-10   Pain Score: Asleep   SpO2: SpO2: 97 % O2 Device:SpO2: 97 % O2 Flow Rate: .   IO: Intake/output summary:  Intake/Output Summary (Last 24 hours) at 01/13/2022 1403 Last data filed at 01/13/2022 0500 Gross per 24 hour  Intake 360 ml  Output 400 ml  Net -40 ml    LBM: Last BM Date : 01/13/22 Baseline Weight: Weight: 96.3 kg Most recent weight: Weight: 90.7 kg (removed SCDs,foley, and tely moniter)     Palliative Assessment/Data:   Flowsheet Rows    Flowsheet Row Most Recent Value  Intake Tab   Referral Department Hospitalist  Unit at Time of Referral Cardiac/Telemetry Unit  Palliative Care Primary Diagnosis Other (Comment)  Date Notified 01/13/22  Palliative Care Type New Palliative care  Reason for referral Clarify Goals of Care  Date of Admission 01/08/22  Date first seen by Palliative Care 01/13/22  # of days Palliative referral response time 0 Day(s)  # of days IP prior to Palliative referral 5  Clinical Assessment   Palliative Performance Scale Score 20%  Pain Max last 24 hours Not able to report  Pain Min Last 24 hours Not able to report  Dyspnea Max Last 24 Hours Not able to report  Dyspnea Min Last 24 hours Not able to report  Psychosocial & Spiritual Assessment   Palliative Care Outcomes        Time In: 1115 Time Out: 1155 Time Total: 40 minutes  Greater than 50%  of this time was spent counseling and coordinating care related to the above assessment and plan.  Signed by: Drue Novel, NP   Please contact Palliative Medicine Team phone at [phone] for questions and concerns.  For individual provider: See Shea Evans

## 2022-01-13 NOTE — Progress Notes (Addendum)
  Bradley Sheppard is seen and evaluated again on 01/13/2022 - Conference at bedside with patient's wife and patient's daughter - Patient was discharged to SNF on 01/12/2022 -He was unable to leave due to delay with insurance approval -Peer to peer review with Dr Lynder Parents on 01/13/22...Marland KitchenMarland KitchenMarland Kitchenpt has been approved for SNF rehab  - Patient with recurrent hospitalization lately and ongoing overall decline........ -Palliative care consult for further delineation of goals of care requested - Patient remains medically stable for transfer to SNF rehab as of 01/13/2022 - Please see full discharge summary previously dictated on 01/12/2022 and addended/updated on 01/13/2022  Total care time is 46 mins   Roxan Hockey, MD

## 2022-01-13 NOTE — Progress Notes (Signed)
This nurse noticed on telemetry of patient's a-fib rhythm and discussed with CCMD. No known history of a-fib but per Estill Bamberg with CCMD, patient has been a-fib/a-flutter this entire admission but not on previous admission. Wife at bedside reports when taking patient's BP at home, she has noticed his HR being above 100 "most of the time." Dr. Denton Brick made aware, EKG obtained and placed in chart.

## 2022-01-13 NOTE — TOC Transition Note (Signed)
Transition of Care [facility]) - CM/SW Discharge Note   Patient Details  Name: Bradley Sheppard MRN: [id number] Date of Birth: [birth year]  Transition of [facility]) CM/SW Contact:  Boneta Lucks, RN Phone Number: 01/13/2022, 11:38 AM   Clinical Narrative:   Peer to Peer completed,  HTA approved for 7 days. AUTH for EMS - [id number] and SNF # [phone]  [facility] accepted and provided a room number.   PASSR # completed -[id number],  RN to call report, Med necessity printed. Daughter updated.    Final next level of care: Skilled Nursing Facility Barriers to Discharge: Barriers Resolved   Patient Goals and CMS Choice Patient states their goals for this hospitalization and ongoing recovery are:: agreeable to SNF CMS Medicare.gov Compare Post Acute Care list provided to:: Patient Represenative (must comment) Choice offered to / list presented to : Spouse  Discharge Placement              Patient [facility] bed at:  [facility]) Patient to be transferred to facility by: EMS Name of family member notified: Daughter - Santiago Glad Patient and family notified of of transfer: 01/13/22  Discharge Plan and Services    UNCR            Readmission Risk Interventions    01/10/2022    8:53 AM 01/01/2022    2:42 PM 01/01/2022    8:55 AM  Readmission Risk Prevention Plan  Transportation Screening Complete Complete Complete  PCP or Specialist Appt within 3-5 Days Not Complete  Complete  HRI or Home Care Consult Complete  Complete  Social Work Consult for [place] Planning/Counseling Complete  Complete  Palliative Care Screening Not Complete  Not Applicable  Medication Review Press photographer) Complete  Complete

## 2022-01-15 ENCOUNTER — Ambulatory Visit (INDEPENDENT_AMBULATORY_CARE_PROVIDER_SITE_OTHER): Payer: PPO | Admitting: Urology

## 2022-01-15 ENCOUNTER — Encounter: Payer: Self-pay | Admitting: Urology

## 2022-01-15 VITALS — BP 88/48 | HR 79 | Ht 72.0 in | Wt 201.0 lb

## 2022-01-15 DIAGNOSIS — R339 Retention of urine, unspecified: Secondary | ICD-10-CM | POA: Diagnosis not present

## 2022-01-15 DIAGNOSIS — I5032 Chronic diastolic (congestive) heart failure: Secondary | ICD-10-CM | POA: Diagnosis not present

## 2022-01-15 DIAGNOSIS — I2510 Atherosclerotic heart disease of native coronary artery without angina pectoris: Secondary | ICD-10-CM | POA: Diagnosis not present

## 2022-01-15 DIAGNOSIS — E1165 Type 2 diabetes mellitus with hyperglycemia: Secondary | ICD-10-CM | POA: Diagnosis not present

## 2022-01-15 DIAGNOSIS — E1140 Type 2 diabetes mellitus with diabetic neuropathy, unspecified: Secondary | ICD-10-CM | POA: Diagnosis not present

## 2022-01-15 NOTE — Progress Notes (Signed)
Assessment: 1. Urinary retention    Plan: I reviewed the patient's chart including his recent hospitalizations. His urinary retention is likely secondary to his recent rectal surgery. Given his current debilitated status and a recent attempt at removal of the Foley catheter 4 days ago, I think it be reasonable to continue the Foley at this time.  Hopefully with continued rehabilitation he will be able to regain his strength which would improve his ability to void spontaneously. Continue tamsulosin 0.4 mg daily. Return to office in 7-10 days for possible voiding trial.  Chief Complaint:  Chief Complaint  Patient presents with   Urinary Retention    History of Present Illness:  Bradley Sheppard is a 81 y.o. male who is seen in consultation from [name], MD for evaluation of urinary retention.  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  A Foley catheter was placed.  He was started on tamsulosin.  He was readmitted to the hospital from 01/08/2022 through 01/13/2022 due to exacerbation of congestive heart failure.  His Foley catheter was removed on 01/11/2022 however he was unable to void and the catheter was replaced.  The catheter has been draining well.  His urine has been clear.  He continues to be very weak and is unable to stand on his own.  He is currently in a skilled nursing facility. He continues on tamsulosin 0.4 mg daily.  He denies any problems voiding prior to his rectal procedure. No prior urologic surgery.   Past Medical History:  Past Medical History:  Diagnosis Date   (HFpEF) heart failure with preserved ejection fraction (HCC)    Anxiety    Aortic stenosis    Arthritis    Asthma    Chronic pain    CKD (chronic kidney disease) stage 3, GFR 30-59 ml/min (HCC)    Coronary atherosclerosis of native coronary artery    Multvessel, DES to LAD and RCA 8/03; EF 65% by echo 11/2014   DM2 (diabetes mellitus, type 2) ([place])     Essential hypertension    Gastric ulcer    Gastritis    Related to NSAIDs   GERD (gastroesophageal reflux disease)    Iron deficiency anemia    Mitral valve regurgitation    Mixed hyperlipidemia    Myocardial infarction [facility])    Nephrolithiasis     Past Surgical History:  Past Surgical History:  Procedure Laterality Date   APPENDECTOMY     BIOPSY  09/25/2021   Procedure: BIOPSY;  Surgeon: Daneil Dolin, MD;  Location: [facility] ENDO SUITE;  Service: Endoscopy;;   CARDIAC CATHETERIZATION  01/2002   90% obstruction in proximal LAD, 60 % obstruction in proximal circumflex and 70%  in proximal RCA. LAD & RCA stented  stents x 2   COLONOSCOPY WITH PROPOFOL N/A 09/25/2021   Procedure: COLONOSCOPY WITH PROPOFOL;  Surgeon: Daneil Dolin, MD;  Location: [facility] ENDO SUITE;  Service: Endoscopy;  Laterality: N/A;  11:00am   L knee replacement  2009   Subsequent infectino requiring resectino arthroplasty with incision and drainage 8/10, and reimplantizathion arthroplasty, 9/20. 5 total surgeries.   PARTIAL PROCTECTOMY BY TEM N/A 12/31/2021   Procedure: TEM PARTIAL PROTECTOMY;  Surgeon: Michael Boston, MD;  Location: [facility] ORS;  Service: General;  Laterality: N/A;    Allergies:  Allergies  Allergen Reactions   Nsaids Other (See Comments)    On blood thinners with h/o IDA/bleeding   Prednisone Other (See Comments)    "makes  him feel faint", hyperglycemic     Family History:  Family History  Problem Relation Age of Onset   Diabetes Mother    Aneurysm Mother        Brain   Stroke Father    Stroke Other    Colon cancer Neg Hx     Social History:  Social History   Tobacco Use   Smoking status: Former    Types: Cigarettes    Passive exposure: Never   Smokeless tobacco: Current    Types: Database administrator Use   Vaping Use: Never used  Substance Use Topics   Alcohol use: No   Drug use: No    Review of symptoms:  Constitutional:  Negative for unexplained weight loss, night sweats, fever,  chills ENT:  Negative for nose bleeds, sinus pain, painful swallowing CV:  Negative for chest pain, loss of consciousness Resp:  Negative for cough, wheezing, shortness of breath GI:  Negative for nausea, vomiting GU:  Positives noted in HPI; otherwise negative for gross hematuria, dysuria, urinary incontinence Neuro:  Negative for seizures, slurred speech Psych:  Negative for lack of energy, depression, anxiety Endocrine:  Negative for polydipsia, polyuria, symptoms of hypoglycemia (dizziness, hunger, sweating) Hematologic:  Negative for anemia, purpura, petechia, prolonged or excessive bleeding, use of anticoagulants  Allergic:  Negative for difficulty breathing or choking as a result of exposure to anything; no shellfish allergy; no allergic response (rash/itch) to materials, foods  Physical exam: BP (!) 88/48   Pulse 79   Ht 6' (1.829 m)   Wt 201 lb (91.2 kg)   BMI 27.26 kg/m  GENERAL APPEARANCE:  Well appearing, well developed, well nourished, NAD HEENT: Atraumatic, Normocephalic, oropharynx clear. NECK: Supple without lymphadenopathy or thyromegaly. LUNGS: Clear to auscultation bilaterally. HEART: Regular Rate and Rhythm without murmurs, gallops, or rubs. ABDOMEN: Soft, non-tender, No Masses. EXTREMITIES: limited mobility of LE's; bilateral LE edema NEUROLOGIC:  Alert and oriented x 3, CN II-XII grossly intact.  MENTAL STATUS:  Appropriate. BACK:  Non-tender to palpation.  No CVAT SKIN:  Warm, dry and intact.   GU:  foley draining yellow urine  Results: None

## 2022-01-20 ENCOUNTER — Encounter: Payer: Self-pay | Admitting: Pulmonary Disease

## 2022-01-20 ENCOUNTER — Ambulatory Visit (INDEPENDENT_AMBULATORY_CARE_PROVIDER_SITE_OTHER): Payer: PPO | Admitting: Pulmonary Disease

## 2022-01-20 DIAGNOSIS — I5032 Chronic diastolic (congestive) heart failure: Secondary | ICD-10-CM

## 2022-01-20 DIAGNOSIS — F1320 Sedative, hypnotic or anxiolytic dependence, uncomplicated: Secondary | ICD-10-CM | POA: Diagnosis not present

## 2022-01-20 DIAGNOSIS — J90 Pleural effusion, not elsewhere classified: Secondary | ICD-10-CM

## 2022-01-20 DIAGNOSIS — J849 Interstitial pulmonary disease, unspecified: Secondary | ICD-10-CM | POA: Insufficient documentation

## 2022-01-20 DIAGNOSIS — N1831 Chronic kidney disease, stage 3a: Secondary | ICD-10-CM | POA: Diagnosis not present

## 2022-01-20 DIAGNOSIS — Z299 Encounter for prophylactic measures, unspecified: Secondary | ICD-10-CM | POA: Diagnosis not present

## 2022-01-20 DIAGNOSIS — E871 Hypo-osmolality and hyponatremia: Secondary | ICD-10-CM | POA: Diagnosis not present

## 2022-01-20 NOTE — Assessment & Plan Note (Signed)
There is mild subpleural reticulation noted on her CT scans performed in the hospital.  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  He has too many acute issues ongoing including diastolic heart failure, new diagnosis of lymphoma and debility. He is not a candidate for antifibrotic's at the current time and we will hold off further work-up till he recovers. We will plan on a high-resolution CT chest in January to reassess

## 2022-01-20 NOTE — Patient Instructions (Signed)
   X HRCT chest in Feb 2024

## 2022-01-20 NOTE — Addendum Note (Signed)
Addended by: Fritzi Mandes D on: 01/20/2022 12:03 PM   Modules accepted: Orders

## 2022-01-20 NOTE — Progress Notes (Signed)
   Subjective:    Patient ID: Bradley Sheppard, male    DOB: 02/26/1941, 81 y.o.   MRN: [id number]  HPI  81 year old man with lymphoma presents for follow-up of bilateral effusions and ILD   admitted 11/2021 with shortness of breath for 1 week, especially on exertion , chest discomfort with coughing, 93% saturation on room air. Initial labs showed D-dimer 2.4, BNP 308, troponin 82, BUN/creatinine was 48/1.6. CT angiogram showed bilateral pleural effusions He underwent thoracentesis with removal of 1.6 L of fluid from his right lung that showed total protein of 3.6 with LDH 171, 6.2K cells with predominant neutrophils   Pertinent  Medical History  CAD status post DES to LAD and RCA in 2003 Moderate AS CKD stage III Non-Hodgkin's lymphoma/MALT -diagnosed on bone marrow biopsy 09/2021, PET scan showing rectal lesion, surgery planned and August by Dr. Johney Maine Diabetes type 2 Hypertension   Significant Events: Including procedures, antibiotic start and stop dates in addition to other pertinent events   7/24 right thoracentesis, 1.6 L fluid , neutrophilic exudate 2/09 CT angiogram chest moderate right and small to moderate left effusion with patchy bilateral groundglass opacities 7/27 thoracentesis left >> 900 cc fluid , neutrophilic exudate 4/70 CT of abd/pelvis was negative for acute findings.  PET scan 08/2021 no pleural hypermetabolism   Chief Complaint  Patient presents with   Hospitalization Follow-up    [facility] admission in July 23-31     He was hospitalized in July for 7 days for bilateral pleural effusions, neutrophilic exudate with evidence of left lower lobe pneumonia, treated with antibiotics. Since then he underwent  resection of rectal bolus adenoma -course complicated by urinary torsion requiring Foley.  He has been incontinent of bowels since Had another hospitalization 9/7 to 9/12 fluid overload, he was diuresed, lower extremity Doppler was negative for DVT, BNP was high at  765.  He was diuresed to 195 pounds and discharged on a regimen of metolazone and Lasix 40 mg daily. He is currently in a rehab facility and has pending oncology appointment to review treatment of lymphoma.  He arrives in a wheelchair accompanied by his wife today  His breathing is okay but he feels tired and sleepy all the time  Chest x-ray 9/5 was reviewed which shows cardiomegaly and interstitial edema, no effusions  Review of Systems neg for any significant sore throat, dysphagia, itching, sneezing, nasal congestion or excess/ purulent secretions, fever, chills, sweats, unintended wt loss, pleuritic or exertional cp, hempoptysis, orthopnea pnd or change in chronic leg swelling. Also denies presyncope, palpitations, heartburn, abdominal pain, nausea, vomiting, diarrhea or change in bowel or urinary habits, dysuria,hematuria, rash, arthralgias, visual complaints, headache, numbness weakness or ataxia.     Objective:   Physical Exam  Gen. Pleasant, well-nourished, man in wheelchair, depressed affect in no distress ENT - no thrush, no pallor/icterus,no post nasal drip Neck: No JVD, no thyromegaly, no carotid bruits Lungs: no use of accessory muscles, no dullness to percussion, clear without rales or rhonchi  Cardiovascular: Rhythm regular, heart sounds  normal, no murmurs or gallops, 1+ peripheral edema Musculoskeletal: No deformities, no cyanosis or clubbing  Indwelling Foley with clear urine         Assessment & Plan:

## 2022-01-20 NOTE — Assessment & Plan Note (Signed)
Euvolemic on a regimen of metolazone and Lasix which seems to be working well. Goal weight should be 195 pounds

## 2022-01-20 NOTE — Assessment & Plan Note (Signed)
Resolved, appears to have been parapneumonic

## 2022-01-22 DIAGNOSIS — I10 Essential (primary) hypertension: Secondary | ICD-10-CM | POA: Diagnosis not present

## 2022-01-22 DIAGNOSIS — E1122 Type 2 diabetes mellitus with diabetic chronic kidney disease: Secondary | ICD-10-CM | POA: Diagnosis not present

## 2022-01-22 DIAGNOSIS — G8929 Other chronic pain: Secondary | ICD-10-CM | POA: Diagnosis not present

## 2022-01-22 DIAGNOSIS — Z299 Encounter for prophylactic measures, unspecified: Secondary | ICD-10-CM | POA: Diagnosis not present

## 2022-01-22 DIAGNOSIS — M25561 Pain in right knee: Secondary | ICD-10-CM | POA: Diagnosis not present

## 2022-01-22 DIAGNOSIS — M25562 Pain in left knee: Secondary | ICD-10-CM | POA: Diagnosis not present

## 2022-01-26 ENCOUNTER — Ambulatory Visit: Payer: PPO | Admitting: Physician Assistant

## 2022-01-27 DIAGNOSIS — L8945 Pressure ulcer of contiguous site of back, buttock and hip, unstageable: Secondary | ICD-10-CM | POA: Diagnosis not present

## 2022-01-27 DIAGNOSIS — L89159 Pressure ulcer of sacral region, unspecified stage: Secondary | ICD-10-CM | POA: Diagnosis not present

## 2022-01-28 ENCOUNTER — Inpatient Hospital Stay (HOSPITAL_BASED_OUTPATIENT_CLINIC_OR_DEPARTMENT_OTHER): Payer: PPO | Admitting: Hematology

## 2022-01-28 ENCOUNTER — Inpatient Hospital Stay: Payer: PPO | Attending: Hematology

## 2022-01-28 ENCOUNTER — Emergency Department (HOSPITAL_COMMUNITY): Payer: PPO

## 2022-01-28 ENCOUNTER — Encounter (HOSPITAL_COMMUNITY): Payer: Self-pay | Admitting: *Deleted

## 2022-01-28 ENCOUNTER — Other Ambulatory Visit: Payer: Self-pay

## 2022-01-28 ENCOUNTER — Inpatient Hospital Stay (HOSPITAL_COMMUNITY)
Admission: EM | Admit: 2022-01-28 | Discharge: 2022-02-04 | DRG: 698 | Disposition: A | Discharge status: Skilled Nursing Facility | Payer: PPO | Source: Ambulatory Visit | Attending: Student | Admitting: Student

## 2022-01-28 VITALS — BP 99/62 | HR 63 | Temp 97.7°F | Resp 18

## 2022-01-28 DIAGNOSIS — Z7189 Other specified counseling: Secondary | ICD-10-CM | POA: Diagnosis not present

## 2022-01-28 DIAGNOSIS — R339 Retention of urine, unspecified: Secondary | ICD-10-CM | POA: Diagnosis not present

## 2022-01-28 DIAGNOSIS — I2510 Atherosclerotic heart disease of native coronary artery without angina pectoris: Secondary | ICD-10-CM | POA: Diagnosis present

## 2022-01-28 DIAGNOSIS — I130 Hypertensive heart and chronic kidney disease with heart failure and stage 1 through stage 4 chronic kidney disease, or unspecified chronic kidney disease: Secondary | ICD-10-CM | POA: Diagnosis not present

## 2022-01-28 DIAGNOSIS — Z96652 Presence of left artificial knee joint: Secondary | ICD-10-CM | POA: Diagnosis present

## 2022-01-28 DIAGNOSIS — C884 Extranodal marginal zone b-cell lymphoma of mucosa-associated lymphoid tissue (malt-lymphoma) not having achieved remission: Secondary | ICD-10-CM | POA: Diagnosis present

## 2022-01-28 DIAGNOSIS — N390 Urinary tract infection, site not specified: Secondary | ICD-10-CM | POA: Diagnosis not present

## 2022-01-28 DIAGNOSIS — N189 Chronic kidney disease, unspecified: Secondary | ICD-10-CM

## 2022-01-28 DIAGNOSIS — Z8719 Personal history of other diseases of the digestive system: Secondary | ICD-10-CM

## 2022-01-28 DIAGNOSIS — J849 Interstitial pulmonary disease, unspecified: Secondary | ICD-10-CM | POA: Diagnosis not present

## 2022-01-28 DIAGNOSIS — Z9079 Acquired absence of other genital organ(s): Secondary | ICD-10-CM

## 2022-01-28 DIAGNOSIS — K6289 Other specified diseases of anus and rectum: Secondary | ICD-10-CM | POA: Diagnosis not present

## 2022-01-28 DIAGNOSIS — E782 Mixed hyperlipidemia: Secondary | ICD-10-CM | POA: Diagnosis present

## 2022-01-28 DIAGNOSIS — Z8711 Personal history of peptic ulcer disease: Secondary | ICD-10-CM

## 2022-01-28 DIAGNOSIS — I5033 Acute on chronic diastolic (congestive) heart failure: Secondary | ICD-10-CM | POA: Diagnosis present

## 2022-01-28 DIAGNOSIS — I509 Heart failure, unspecified: Secondary | ICD-10-CM | POA: Diagnosis not present

## 2022-01-28 DIAGNOSIS — L89159 Pressure ulcer of sacral region, unspecified stage: Secondary | ICD-10-CM | POA: Diagnosis not present

## 2022-01-28 DIAGNOSIS — E871 Hypo-osmolality and hyponatremia: Secondary | ICD-10-CM | POA: Diagnosis not present

## 2022-01-28 DIAGNOSIS — A4151 Sepsis due to Escherichia coli [E. coli]: Secondary | ICD-10-CM | POA: Diagnosis present

## 2022-01-28 DIAGNOSIS — Z66 Do not resuscitate: Secondary | ICD-10-CM | POA: Diagnosis not present

## 2022-01-28 DIAGNOSIS — T83518A Infection and inflammatory reaction due to other urinary catheter, initial encounter: Principal | ICD-10-CM | POA: Diagnosis present

## 2022-01-28 DIAGNOSIS — Z886 Allergy status to analgesic agent status: Secondary | ICD-10-CM

## 2022-01-28 DIAGNOSIS — K625 Hemorrhage of anus and rectum: Secondary | ICD-10-CM | POA: Diagnosis not present

## 2022-01-28 DIAGNOSIS — I700 Atherosclerosis of aorta: Secondary | ICD-10-CM | POA: Diagnosis not present

## 2022-01-28 DIAGNOSIS — E1122 Type 2 diabetes mellitus with diabetic chronic kidney disease: Secondary | ICD-10-CM | POA: Diagnosis present

## 2022-01-28 DIAGNOSIS — N3090 Cystitis, unspecified without hematuria: Secondary | ICD-10-CM

## 2022-01-28 DIAGNOSIS — R197 Diarrhea, unspecified: Secondary | ICD-10-CM | POA: Diagnosis present

## 2022-01-28 DIAGNOSIS — I350 Nonrheumatic aortic (valve) stenosis: Secondary | ICD-10-CM

## 2022-01-28 DIAGNOSIS — N139 Obstructive and reflux uropathy, unspecified: Secondary | ICD-10-CM | POA: Diagnosis not present

## 2022-01-28 DIAGNOSIS — R319 Hematuria, unspecified: Secondary | ICD-10-CM | POA: Diagnosis present

## 2022-01-28 DIAGNOSIS — Z833 Family history of diabetes mellitus: Secondary | ICD-10-CM

## 2022-01-28 DIAGNOSIS — D631 Anemia in chronic kidney disease: Secondary | ICD-10-CM | POA: Diagnosis present

## 2022-01-28 DIAGNOSIS — K631 Perforation of intestine (nontraumatic): Secondary | ICD-10-CM | POA: Diagnosis present

## 2022-01-28 DIAGNOSIS — E119 Type 2 diabetes mellitus without complications: Secondary | ICD-10-CM

## 2022-01-28 DIAGNOSIS — Z9181 History of falling: Secondary | ICD-10-CM

## 2022-01-28 DIAGNOSIS — D649 Anemia, unspecified: Secondary | ICD-10-CM | POA: Diagnosis not present

## 2022-01-28 DIAGNOSIS — R5381 Other malaise: Secondary | ICD-10-CM | POA: Diagnosis not present

## 2022-01-28 DIAGNOSIS — N1832 Chronic kidney disease, stage 3b: Secondary | ICD-10-CM

## 2022-01-28 DIAGNOSIS — R627 Adult failure to thrive: Secondary | ICD-10-CM | POA: Diagnosis present

## 2022-01-28 DIAGNOSIS — Z7984 Long term (current) use of oral hypoglycemic drugs: Secondary | ICD-10-CM

## 2022-01-28 DIAGNOSIS — J45909 Unspecified asthma, uncomplicated: Secondary | ICD-10-CM | POA: Diagnosis not present

## 2022-01-28 DIAGNOSIS — Y846 Urinary catheterization as the cause of abnormal reaction of the patient, or of later complication, without mention of misadventure at the time of the procedure: Secondary | ICD-10-CM | POA: Diagnosis present

## 2022-01-28 DIAGNOSIS — I480 Paroxysmal atrial fibrillation: Secondary | ICD-10-CM

## 2022-01-28 DIAGNOSIS — L8990 Pressure ulcer of unspecified site, unspecified stage: Secondary | ICD-10-CM | POA: Diagnosis not present

## 2022-01-28 DIAGNOSIS — I10 Essential (primary) hypertension: Secondary | ICD-10-CM | POA: Diagnosis not present

## 2022-01-28 DIAGNOSIS — I080 Rheumatic disorders of both mitral and aortic valves: Secondary | ICD-10-CM | POA: Diagnosis not present

## 2022-01-28 DIAGNOSIS — M6281 Muscle weakness (generalized): Secondary | ICD-10-CM | POA: Diagnosis not present

## 2022-01-28 DIAGNOSIS — Z960 Presence of urogenital implants: Secondary | ICD-10-CM | POA: Diagnosis not present

## 2022-01-28 DIAGNOSIS — Z823 Family history of stroke: Secondary | ICD-10-CM

## 2022-01-28 DIAGNOSIS — I5032 Chronic diastolic (congestive) heart failure: Secondary | ICD-10-CM | POA: Diagnosis present

## 2022-01-28 DIAGNOSIS — A4150 Gram-negative sepsis, unspecified: Secondary | ICD-10-CM | POA: Diagnosis not present

## 2022-01-28 DIAGNOSIS — K5730 Diverticulosis of large intestine without perforation or abscess without bleeding: Secondary | ICD-10-CM | POA: Diagnosis not present

## 2022-01-28 DIAGNOSIS — Z955 Presence of coronary angioplasty implant and graft: Secondary | ICD-10-CM | POA: Diagnosis not present

## 2022-01-28 DIAGNOSIS — I252 Old myocardial infarction: Secondary | ICD-10-CM | POA: Diagnosis not present

## 2022-01-28 DIAGNOSIS — Z888 Allergy status to other drugs, medicaments and biological substances status: Secondary | ICD-10-CM

## 2022-01-28 DIAGNOSIS — A419 Sepsis, unspecified organism: Secondary | ICD-10-CM | POA: Diagnosis present

## 2022-01-28 DIAGNOSIS — R338 Other retention of urine: Secondary | ICD-10-CM | POA: Diagnosis present

## 2022-01-28 DIAGNOSIS — N401 Enlarged prostate with lower urinary tract symptoms: Secondary | ICD-10-CM | POA: Diagnosis present

## 2022-01-28 DIAGNOSIS — L89152 Pressure ulcer of sacral region, stage 2: Secondary | ICD-10-CM | POA: Diagnosis present

## 2022-01-28 DIAGNOSIS — Z87442 Personal history of urinary calculi: Secondary | ICD-10-CM

## 2022-01-28 DIAGNOSIS — R000 Tachycardia, unspecified: Secondary | ICD-10-CM | POA: Diagnosis not present

## 2022-01-28 DIAGNOSIS — T83511A Infection and inflammatory reaction due to indwelling urethral catheter, initial encounter: Secondary | ICD-10-CM | POA: Diagnosis not present

## 2022-01-28 DIAGNOSIS — N3091 Cystitis, unspecified with hematuria: Secondary | ICD-10-CM | POA: Diagnosis not present

## 2022-01-28 DIAGNOSIS — K219 Gastro-esophageal reflux disease without esophagitis: Secondary | ICD-10-CM | POA: Diagnosis present

## 2022-01-28 DIAGNOSIS — D472 Monoclonal gammopathy: Secondary | ICD-10-CM

## 2022-01-28 DIAGNOSIS — I5031 Acute diastolic (congestive) heart failure: Secondary | ICD-10-CM | POA: Diagnosis not present

## 2022-01-28 DIAGNOSIS — R2689 Other abnormalities of gait and mobility: Secondary | ICD-10-CM | POA: Diagnosis not present

## 2022-01-28 DIAGNOSIS — E11649 Type 2 diabetes mellitus with hypoglycemia without coma: Secondary | ICD-10-CM

## 2022-01-28 DIAGNOSIS — Z79899 Other long term (current) drug therapy: Secondary | ICD-10-CM

## 2022-01-28 DIAGNOSIS — I4891 Unspecified atrial fibrillation: Secondary | ICD-10-CM

## 2022-01-28 DIAGNOSIS — I5043 Acute on chronic combined systolic (congestive) and diastolic (congestive) heart failure: Secondary | ICD-10-CM | POA: Diagnosis not present

## 2022-01-28 DIAGNOSIS — T8189XA Other complications of procedures, not elsewhere classified, initial encounter: Secondary | ICD-10-CM | POA: Diagnosis present

## 2022-01-28 DIAGNOSIS — R531 Weakness: Secondary | ICD-10-CM | POA: Diagnosis not present

## 2022-01-28 DIAGNOSIS — E222 Syndrome of inappropriate secretion of antidiuretic hormone: Secondary | ICD-10-CM | POA: Diagnosis not present

## 2022-01-28 DIAGNOSIS — Z7401 Bed confinement status: Secondary | ICD-10-CM | POA: Diagnosis not present

## 2022-01-28 DIAGNOSIS — R159 Full incontinence of feces: Secondary | ICD-10-CM | POA: Diagnosis present

## 2022-01-28 DIAGNOSIS — R0602 Shortness of breath: Secondary | ICD-10-CM | POA: Diagnosis not present

## 2022-01-28 DIAGNOSIS — Z7982 Long term (current) use of aspirin: Secondary | ICD-10-CM

## 2022-01-28 DIAGNOSIS — T83511D Infection and inflammatory reaction due to indwelling urethral catheter, subsequent encounter: Secondary | ICD-10-CM | POA: Diagnosis not present

## 2022-01-28 LAB — COMPREHENSIVE METABOLIC PANEL
ALT: 17 U/L (ref 0–44)
ALT: 16 U/L (ref 0–44)
AST: 21 U/L (ref 15–41)
AST: 22 U/L (ref 15–41)
Albumin: 2.4 g/dL — ABNORMAL LOW (ref 3.5–5.0)
Albumin: 2.4 g/dL — ABNORMAL LOW (ref 3.5–5.0)
Alkaline Phosphatase: 81 U/L (ref 38–126)
Alkaline Phosphatase: 81 U/L (ref 38–126)
Anion gap: 11 (ref 5–15)
Anion gap: 12 (ref 5–15)
BUN: 52 mg/dL — ABNORMAL HIGH (ref 8–23)
BUN: 52 mg/dL — ABNORMAL HIGH (ref 8–23)
CO2: 23 mmol/L (ref 22–32)
CO2: 24 mmol/L (ref 22–32)
Calcium: 7.9 mg/dL — ABNORMAL LOW (ref 8.9–10.3)
Calcium: 8.2 mg/dL — ABNORMAL LOW (ref 8.9–10.3)
Chloride: 87 mmol/L — ABNORMAL LOW (ref 98–111)
Chloride: 86 mmol/L — ABNORMAL LOW (ref 98–111)
Creatinine, Ser: 1.86 mg/dL — ABNORMAL HIGH (ref 0.61–1.24)
Creatinine, Ser: 1.91 mg/dL — ABNORMAL HIGH (ref 0.61–1.24)
GFR, Estimated: 35 mL/min — ABNORMAL LOW (ref >60)
GFR, Estimated: 34 mL/min — ABNORMAL LOW (ref >60)
Glucose, Bld: 240 mg/dL — ABNORMAL HIGH (ref 70–99)
Glucose, Bld: 169 mg/dL — ABNORMAL HIGH (ref 70–99)
Potassium: 4.5 mmol/L (ref 3.5–5.1)
Potassium: 4.6 mmol/L (ref 3.5–5.1)
Sodium: 121 mmol/L — ABNORMAL LOW (ref 135–145)
Sodium: 122 mmol/L — ABNORMAL LOW (ref 135–145)
Total Bilirubin: 0.7 mg/dL (ref 0.3–1.2)
Total Bilirubin: 0.8 mg/dL (ref 0.3–1.2)
Total Protein: 6.3 g/dL — ABNORMAL LOW (ref 6.5–8.1)
Total Protein: 6.6 g/dL (ref 6.5–8.1)

## 2022-01-28 LAB — URINALYSIS, ROUTINE W REFLEX MICROSCOPIC
Bilirubin Urine: NEGATIVE
Glucose, UA: NEGATIVE mg/dL
Ketones, ur: NEGATIVE mg/dL
Nitrite: NEGATIVE
Protein, ur: NEGATIVE mg/dL
Specific Gravity, Urine: 1.01 (ref 1.005–1.030)
WBC, UA: >50 WBC/hpf — ABNORMAL HIGH (ref 0–5)
pH: 5 (ref 5.0–8.0)

## 2022-01-28 LAB — MAGNESIUM: Magnesium: 1.9 mg/dL (ref 1.7–2.4)

## 2022-01-28 LAB — CBC WITH DIFFERENTIAL/PLATELET
Abs Immature Granulocytes: 0.11 10*3/uL — ABNORMAL HIGH (ref 0.00–0.07)
Abs Immature Granulocytes: 0.09 10*3/uL — ABNORMAL HIGH (ref 0.00–0.07)
Basophils Absolute: 0 10*3/uL (ref 0.0–0.1)
Basophils Absolute: 0 10*3/uL (ref 0.0–0.1)
Basophils Relative: 0 %
Basophils Relative: 0 %
Eosinophils Absolute: 0.1 10*3/uL (ref 0.0–0.5)
Eosinophils Absolute: 0.1 10*3/uL (ref 0.0–0.5)
Eosinophils Relative: 1 %
Eosinophils Relative: 0 %
HCT: 26.9 % — ABNORMAL LOW (ref 39.0–52.0)
HCT: 28.1 % — ABNORMAL LOW (ref 39.0–52.0)
Hemoglobin: 9.3 g/dL — ABNORMAL LOW (ref 13.0–17.0)
Hemoglobin: 9.6 g/dL — ABNORMAL LOW (ref 13.0–17.0)
Immature Granulocytes: 1 %
Immature Granulocytes: 1 %
Lymphocytes Relative: 18 %
Lymphocytes Relative: 20 %
Lymphs Abs: 2.7 10*3/uL (ref 0.7–4.0)
Lymphs Abs: 3 10*3/uL (ref 0.7–4.0)
MCH: 30.8 pg (ref 26.0–34.0)
MCH: 30.5 pg (ref 26.0–34.0)
MCHC: 34.6 g/dL (ref 30.0–36.0)
MCHC: 34.2 g/dL (ref 30.0–36.0)
MCV: 89.1 fL (ref 80.0–100.0)
MCV: 89.2 fL (ref 80.0–100.0)
Monocytes Absolute: 1 10*3/uL (ref 0.1–1.0)
Monocytes Absolute: 0.9 10*3/uL (ref 0.1–1.0)
Monocytes Relative: 6 %
Monocytes Relative: 6 %
Neutro Abs: 10.9 10*3/uL — ABNORMAL HIGH (ref 1.7–7.7)
Neutro Abs: 11.2 10*3/uL — ABNORMAL HIGH (ref 1.7–7.7)
Neutrophils Relative %: 74 %
Neutrophils Relative %: 73 %
Platelets: 387 10*3/uL (ref 150–400)
Platelets: 392 10*3/uL (ref 150–400)
RBC: 3.02 MIL/uL — ABNORMAL LOW (ref 4.22–5.81)
RBC: 3.15 MIL/uL — ABNORMAL LOW (ref 4.22–5.81)
RDW: 13.7 % (ref 11.5–15.5)
RDW: 13.8 % (ref 11.5–15.5)
WBC: 14.8 10*3/uL — ABNORMAL HIGH (ref 4.0–10.5)
WBC: 15.3 10*3/uL — ABNORMAL HIGH (ref 4.0–10.5)
nRBC: 0 % (ref 0.0–0.2)
nRBC: 0 % (ref 0.0–0.2)

## 2022-01-28 LAB — FERRITIN: Ferritin: 373 ng/mL — ABNORMAL HIGH (ref 24–336)

## 2022-01-28 LAB — SODIUM, URINE, RANDOM: Sodium, Ur: 55 mmol/L

## 2022-01-28 LAB — IRON AND TIBC
Iron: 25 ug/dL — ABNORMAL LOW (ref 45–182)
Saturation Ratios: 13 % — ABNORMAL LOW (ref 17.9–39.5)
TIBC: 199 ug/dL — ABNORMAL LOW (ref 250–450)
UIBC: 174 ug/dL

## 2022-01-28 LAB — CBG MONITORING, ED: Glucose-Capillary: 213 mg/dL — ABNORMAL HIGH (ref 70–99)

## 2022-01-28 LAB — LACTIC ACID, PLASMA: Lactic Acid, Venous: 1.1 mmol/L (ref 0.5–1.9)

## 2022-01-28 LAB — LACTATE DEHYDROGENASE: LDH: 166 U/L (ref 98–192)

## 2022-01-28 MED ORDER — SODIUM CHLORIDE 0.9 % IV BOLUS (SEPSIS)
500.0000 mL | Freq: Once | INTRAVENOUS | Status: AC
  Administered 2022-01-28: 500 mL via INTRAVENOUS

## 2022-01-28 MED ORDER — OXYCODONE-ACETAMINOPHEN 5-325 MG PO TABS
1.0000 | ORAL_TABLET | Freq: Once | ORAL | Status: AC
  Administered 2022-01-28: 1 via ORAL
  Filled 2022-01-28: qty 1

## 2022-01-28 MED ORDER — CARVEDILOL 12.5 MG PO TABS
12.5000 mg | ORAL_TABLET | Freq: Two times a day (BID) | ORAL | Status: DC
  Administered 2022-01-29 – 2022-02-04 (×12): 12.5 mg via ORAL
  Filled 2022-01-28 (×14): qty 1

## 2022-01-28 MED ORDER — PIPERACILLIN-TAZOBACTAM 3.375 G IVPB
3.3750 g | Freq: Three times a day (TID) | INTRAVENOUS | Status: DC
  Filled 2022-01-28: qty 50

## 2022-01-28 MED ORDER — MORPHINE SULFATE (PF) 4 MG/ML IV SOLN
4.0000 mg | Freq: Once | INTRAVENOUS | Status: AC
  Administered 2022-01-28: 4 mg via INTRAVENOUS
  Filled 2022-01-28: qty 1

## 2022-01-28 MED ORDER — HYDROMORPHONE HCL 1 MG/ML IJ SOLN
0.2500 mg | Freq: Once | INTRAMUSCULAR | Status: DC
  Filled 2022-01-28: qty 0.5

## 2022-01-28 MED ORDER — IOHEXOL 300 MG/ML  SOLN
100.0000 mL | Freq: Once | INTRAMUSCULAR | Status: AC | PRN
  Administered 2022-01-28: 80 mL via INTRAVENOUS

## 2022-01-28 MED ORDER — ACETAMINOPHEN 325 MG PO TABS
650.0000 mg | ORAL_TABLET | Freq: Four times a day (QID) | ORAL | Status: DC | PRN
  Administered 2022-01-29 – 2022-02-04 (×7): 650 mg via ORAL
  Filled 2022-01-28 (×7): qty 2

## 2022-01-28 MED ORDER — INSULIN ASPART 100 UNIT/ML IJ SOLN
0.0000 [IU] | Freq: Four times a day (QID) | INTRAMUSCULAR | Status: DC
  Administered 2022-01-28 – 2022-01-29 (×2): 3 [IU] via SUBCUTANEOUS
  Administered 2022-01-29: 1 [IU] via SUBCUTANEOUS
  Administered 2022-01-30 (×2): 3 [IU] via SUBCUTANEOUS
  Administered 2022-01-31: 5 [IU] via SUBCUTANEOUS
  Administered 2022-01-31: 2 [IU] via SUBCUTANEOUS
  Administered 2022-01-31: 5 [IU] via SUBCUTANEOUS
  Administered 2022-02-01: 3 [IU] via SUBCUTANEOUS
  Administered 2022-02-01: 2 [IU] via SUBCUTANEOUS
  Administered 2022-02-01: 3 [IU] via SUBCUTANEOUS
  Administered 2022-02-02: 5 [IU] via SUBCUTANEOUS
  Administered 2022-02-02: 2 [IU] via SUBCUTANEOUS
  Administered 2022-02-02: 3 [IU] via SUBCUTANEOUS
  Administered 2022-02-03: 2 [IU] via SUBCUTANEOUS
  Administered 2022-02-03: 5 [IU] via SUBCUTANEOUS
  Filled 2022-01-28: qty 1

## 2022-01-28 MED ORDER — PIPERACILLIN-TAZOBACTAM 3.375 G IVPB 30 MIN
3.3750 g | Freq: Once | INTRAVENOUS | Status: AC
  Administered 2022-01-28: 3.375 g via INTRAVENOUS
  Filled 2022-01-28: qty 50

## 2022-01-28 MED ORDER — ROSUVASTATIN CALCIUM 5 MG PO TABS
5.0000 mg | ORAL_TABLET | ORAL | Status: DC
  Administered 2022-02-04: 5 mg via ORAL
  Filled 2022-01-28: qty 1

## 2022-01-28 MED ORDER — PIPERACILLIN-TAZOBACTAM 3.375 G IVPB: 3.3750 g | Freq: Four times a day (QID) | INTRAVENOUS | Status: DC

## 2022-01-28 MED ORDER — ACETAMINOPHEN 650 MG RE SUPP: 650.0000 mg | Freq: Four times a day (QID) | RECTAL | Status: DC | PRN

## 2022-01-28 MED ORDER — PIPERACILLIN-TAZOBACTAM IN DEX 2-0.25 GM/50ML IV SOLN: 2.2500 g | Freq: Three times a day (TID) | INTRAVENOUS | Status: DC

## 2022-01-28 MED ORDER — LORAZEPAM 0.5 MG PO TABS
0.5000 mg | ORAL_TABLET | Freq: Every day | ORAL | Status: DC
  Administered 2022-01-28 – 2022-02-03 (×7): 0.5 mg via ORAL
  Filled 2022-01-28 (×7): qty 1

## 2022-01-28 MED ORDER — SODIUM CHLORIDE 0.9 % IV SOLN
1000.0000 mL | INTRAVENOUS | Status: DC
  Administered 2022-01-28: 1000 mL via INTRAVENOUS

## 2022-01-28 MED ORDER — ONDANSETRON HCL 4 MG/2ML IJ SOLN: 4.0000 mg | Freq: Four times a day (QID) | INTRAMUSCULAR | Status: DC | PRN

## 2022-01-28 MED ORDER — PIPERACILLIN-TAZOBACTAM 3.375 G IVPB
3.3750 g | Freq: Three times a day (TID) | INTRAVENOUS | Status: DC
  Administered 2022-01-28 – 2022-01-30 (×5): 3.375 g via INTRAVENOUS
  Filled 2022-01-28 (×4): qty 50

## 2022-01-28 MED ORDER — TAMSULOSIN HCL 0.4 MG PO CAPS
0.4000 mg | ORAL_CAPSULE | Freq: Every day | ORAL | Status: DC
  Administered 2022-01-29 – 2022-02-04 (×7): 0.4 mg via ORAL
  Filled 2022-01-28 (×7): qty 1

## 2022-01-28 MED ORDER — INSULIN ASPART 100 UNIT/ML IJ SOLN: 0.0000 [IU] | Freq: Four times a day (QID) | INTRAMUSCULAR | Status: DC

## 2022-01-28 MED ORDER — POLYETHYLENE GLYCOL 3350 17 G PO PACK: 17.0000 g | PACK | Freq: Every day | ORAL | Status: DC | PRN

## 2022-01-28 MED ORDER — MORPHINE SULFATE (PF) 2 MG/ML IV SOLN
2.0000 mg | INTRAVENOUS | Status: DC | PRN
  Administered 2022-01-29 – 2022-02-03 (×9): 2 mg via INTRAVENOUS
  Filled 2022-01-28 (×10): qty 1

## 2022-01-28 MED ORDER — SODIUM CHLORIDE 0.9 % IV SOLN
1000.0000 mL | INTRAVENOUS | Status: DC
  Administered 2022-01-28 – 2022-01-29 (×2): 1000 mL via INTRAVENOUS

## 2022-01-28 MED ORDER — ONDANSETRON HCL 4 MG PO TABS: 4.0000 mg | ORAL_TABLET | Freq: Four times a day (QID) | ORAL | Status: DC | PRN

## 2022-01-28 NOTE — Progress Notes (Signed)
Bradley Sheppard,  [id number]   CLINIC:  Medical Oncology/Hematology  PCP:  Bradley Chroman, MD [address] / [place] [address]  [phone]  REASON FOR VISIT:  Follow-up for rectal mass, MALT lymphoma, normocytic anemia, and MGUS  PRIOR THERAPY: none  CURRENT THERAPY: under work-up  INTERVAL HISTORY:  [name], a 81 y.o. male, Is seen for follow-up of MALT lymphoma, normocytic anemia and MGUS.  He reports that he is feeling very fatigued.  He is currently at the rehab facility.  Review of records show he was recently in the hospital from 01/08/2022 through 01/13/2022 for CHF exacerbation.  Wife reports that his sodium was low at 126 in the past few days.  REVIEW OF SYSTEMS:  Review of Systems  Constitutional:  Positive for fatigue. Negative for appetite change.  Cardiovascular:  Positive for palpitations.  Musculoskeletal:  Positive for arthralgias (legs and back).  Neurological:  Positive for numbness (feet).  All other systems reviewed and are negative.   PAST MEDICAL/SURGICAL HISTORY:  Past Medical History:  Diagnosis Date   (HFpEF) heart failure with preserved ejection fraction (HCC)    Anxiety    Aortic stenosis    Arthritis    Asthma    Chronic pain    CKD (chronic kidney disease) stage 3, GFR 30-59 ml/min (HCC)    Coronary atherosclerosis of native coronary artery    Multvessel, DES to LAD and RCA 8/03; EF 65% by echo 11/2014   DM2 (diabetes mellitus, type 2) ([place])    Essential hypertension    Gastric ulcer    Gastritis    Related to NSAIDs   GERD (gastroesophageal reflux disease)    Iron deficiency anemia    Mitral valve regurgitation    Mixed hyperlipidemia    Myocardial infarction [facility])    Nephrolithiasis    Past Surgical History:  Procedure Laterality Date   APPENDECTOMY     BIOPSY  09/25/2021   Procedure: BIOPSY;  Surgeon: Daneil Dolin, MD;  Location: [facility];  Service: Endoscopy;;   CARDIAC  CATHETERIZATION  01/2002   90% obstruction in proximal LAD, 60 % obstruction in proximal circumflex and 70%  in proximal RCA. LAD & RCA stented  stents x 2   COLONOSCOPY WITH PROPOFOL N/A 09/25/2021   Procedure: COLONOSCOPY WITH PROPOFOL;  Surgeon: Daneil Dolin, MD;  Location: [facility];  Service: Endoscopy;  Laterality: N/A;  11:00am   L knee replacement  2009   Subsequent infectino requiring resectino arthroplasty with incision and drainage 8/10, and reimplantizathion arthroplasty, 9/20. 5 total surgeries.   PARTIAL PROCTECTOMY BY TEM N/A 12/31/2021   Procedure: TEM PARTIAL PROTECTOMY;  Surgeon: Michael Boston, MD;  Location: [facility];  Service: General;  Laterality: N/A;    SOCIAL HISTORY:  Social History   Socioeconomic History   Marital status: Married    Spouse name: Not on file   Number of children: Not on file   Years of education: Not on file   Highest education level: Not on file  Occupational History   Not on file  Tobacco Use   Smoking status: Former    Types: Cigarettes    Passive exposure: Never   Smokeless tobacco: Current    Types: Chew  Vaping Use   Vaping Use: Never used  Substance and Sexual Activity   Alcohol use: No   Drug use: No   Sexual activity: Not Currently  Other Topics  Concern   Not on file  Social History Narrative   Full time- textiles         Social Determinants of Health   Financial Resource Strain: Not on file  Food Insecurity: No Food Insecurity (01/08/2022)   Hunger Vital Sign    Worried About Running Out of Food in the Last Year: Never true    Ran Out of Food in the Last Year: Never true  Transportation Needs: No Transportation Needs (01/08/2022)   PRAPARE - Hydrologist (Medical): No    Lack of Transportation (Non-Medical): No  Physical Activity: Not on file  Stress: Not on file  Social Connections: Not on file  Intimate Partner Violence: Not At Risk (01/08/2022)   Humiliation, Afraid, Rape, and Kick  questionnaire    Fear of Current or Ex-Partner: No    Emotionally Abused: No    Physically Abused: No    Sexually Abused: No    FAMILY HISTORY:  Family History  Problem Relation Age of Onset   Diabetes Mother    Aneurysm Mother        Brain   Stroke Father    Stroke Other    Colon cancer Neg Hx     CURRENT MEDICATIONS:  Current Outpatient Medications  Medication Sig Dispense Refill   acetaminophen (TYLENOL) 500 MG tablet Take 1,000 mg by mouth every 6 (six) hours as needed for moderate pain.     aspirin EC 81 MG tablet Take 1 tablet (81 mg total) by mouth daily with breakfast. Swallow whole. 30 tablet 12   carvedilol (COREG) 12.5 MG tablet Take 1 tablet (12.5 mg total) by mouth 2 (two) times daily with a meal. 60 tablet 2   ferrous sulfate 324 MG TBEC Take 324 mg by mouth 2 (two) times daily.     furosemide (LASIX) 40 MG tablet Take 1 tablet (40 mg total) by mouth daily. 30 tablet 1   glipiZIDE (GLUCOTROL XL) 10 MG 24 hr tablet Take 10 mg by mouth 2 (two) times daily.     loperamide (IMODIUM) 2 MG capsule Take 1 capsule (2 mg total) by mouth at bedtime. 30 capsule 0   LORazepam (ATIVAN) 0.5 MG tablet Take 1 tablet (0.5 mg total) by mouth at bedtime. 10 tablet 0   metFORMIN (GLUCOPHAGE) 500 MG tablet Take 500-1,000 mg by mouth See admin instructions. Take 1000 mg in the morning and 500 mg at night     metolazone (ZAROXOLYN) 5 MG tablet Take 1 tablet (5 mg total) by mouth See admin instructions. Take 1 every Tuesdays and Fridays 8 tablet 1   oxyCODONE (OXY IR/ROXICODONE) 5 MG immediate release tablet Take 0.5-1 tablets (2.5-5 mg total) by mouth every 6 (six) hours as needed for moderate pain, severe pain or breakthrough pain. 15 tablet 0   pioglitazone (ACTOS) 45 MG tablet Take 45 mg by mouth daily.     polycarbophil (FIBERCON) 625 MG tablet Take 1 tablet (625 mg total) by mouth daily. 90 tablet 2   rosuvastatin (CRESTOR) 5 MG tablet Take 5 mg by mouth every Wednesday.     tamsulosin  (FLOMAX) 0.4 MG CAPS capsule Take 1 capsule (0.4 mg total) by mouth daily. 20 capsule 1   tiZANidine (ZANAFLEX) 2 MG tablet Take 2 mg by mouth 2 (two) times daily.     vitamin B-12 (CYANOCOBALAMIN) 500 MCG tablet Take 500 mcg by mouth daily.     vitamin C (ASCORBIC ACID) 500 MG tablet Take  500 mg by mouth at bedtime.     Zinc Oxide 40 % PSTE Apply 1 Application topically as needed. Apply to buttock and sacrum if silicone foam is not effective due to incontinence 453 g 1   nitroGLYCERIN (NITROSTAT) 0.4 MG SL tablet Place 1 tablet (0.4 mg total) under the tongue every 5 (five) minutes as needed. (Patient not taking: Reported on 01/28/2022) 25 tablet 3   No current facility-administered medications for this visit.    ALLERGIES:  Allergies  Allergen Reactions   Nsaids Other (See Comments)    On blood thinners with h/o IDA/bleeding   Prednisone Other (See Comments)    "makes him feel faint", hyperglycemic     PHYSICAL EXAM:  Performance status (ECOG): 2 - Symptomatic, <50% confined to bed  Vitals:   01/28/22 1130  BP: 99/62  Pulse: 63  Resp: 18  Temp: 97.7 F (36.5 C)  SpO2: 98%   Wt Readings from Last 3 Encounters:  01/28/22 189 lb (85.7 kg)  01/20/22 201 lb (91.2 kg)  01/15/22 201 lb (91.2 kg)   Physical Exam Vitals reviewed.  Constitutional:      Appearance: Normal appearance.     Comments: In wheelchair  Cardiovascular:     Rate and Rhythm: Normal rate and regular rhythm.     Pulses: Normal pulses.     Heart sounds: Normal heart sounds.  Pulmonary:     Effort: Pulmonary effort is normal.     Breath sounds: Normal breath sounds.  Neurological:     General: No focal deficit present.     Mental Status: He is alert and oriented to person, place, and time.  Psychiatric:        Mood and Affect: Mood normal.        Behavior: Behavior normal.     LABORATORY DATA:  I have reviewed the labs as listed.     Latest Ref Rng & Units 01/28/2022   10:08 AM 01/13/2022    8:17  AM 01/12/2022    4:48 AM  CBC  WBC 4.0 - 10.5 K/uL 14.8  8.1    Hemoglobin 13.0 - 17.0 g/dL 9.3  8.9  9.1   Hematocrit 39.0 - 52.0 % 26.9  27.0  27.2   Platelets 150 - 400 K/uL 387  410        Latest Ref Rng & Units 01/28/2022   10:08 AM 01/13/2022    8:17 AM 01/11/2022    5:07 AM  CMP  Glucose 70 - 99 mg/dL 240  155  162   BUN 8 - 23 mg/dL 52  55  44   Creatinine 0.61 - 1.24 mg/dL 1.86  1.91  1.69   Sodium 135 - 145 mmol/L 121  132  135   Potassium 3.5 - 5.1 mmol/L 4.5  4.7  4.6   Chloride 98 - 111 mmol/L 87  97  102   CO2 22 - 32 mmol/L 23  25  27    Calcium 8.9 - 10.3 mg/dL 7.9  8.3  8.3   Total Protein 6.5 - 8.1 g/dL 6.3     Total Bilirubin 0.3 - 1.2 mg/dL 0.7     Alkaline Phos 38 - 126 U/L 81     AST 15 - 41 U/L 21     ALT 0 - 44 U/L 17         Component Value Date/Time   RBC 3.02 (L) 01/28/2022 1008   MCV 89.1 01/28/2022 1008   MCV 96  12/09/2021 0948   MCH 30.8 01/28/2022 1008   MCHC 34.6 01/28/2022 1008   RDW 13.7 01/28/2022 1008   RDW 12.8 12/09/2021 0948   LYMPHSABS 2.7 01/28/2022 1008   MONOABS 1.0 01/28/2022 1008   EOSABS 0.1 01/28/2022 1008   BASOSABS 0.0 01/28/2022 1008    DIAGNOSTIC IMAGING:  I have independently reviewed the scans and discussed with the patient. DG Chest 2 View  Result Date: 01/11/2022 CLINICAL DATA:  Dyspnea EXAM: CHEST - 2 VIEW COMPARISON:  01/08/2022 FINDINGS: Stable cardiomegaly. Aortic atherosclerosis. Mild diffuse interstitial prominence, similar to prior. No pleural effusion or pneumothorax. IMPRESSION: Cardiomegaly and mild edema, similar to prior. Electronically Signed   By: Davina Poke D.O.   On: 01/11/2022 11:29   DG Chest Port 1 View  Result Date: 01/08/2022 CLINICAL DATA:  Weakness, left leg swelling EXAM: PORTABLE CHEST 1 VIEW COMPARISON:  Radiograph 11/27/2021 FINDINGS: Unchanged enlarged cardiac silhouette. Aortic arch calcifications. There are mild diffuse interstitial opacities. There is no pleural effusion. No  pneumothorax. Thoracic spondylosis with dextroconvex curvature. Severe bilateral glenohumeral osteoarthritis. IMPRESSION: Cardiomegaly with interstitial pulmonary edema. Electronically Signed   By: Maurine Simmering M.D.   On: 01/08/2022 12:04   US Venous Img Lower Bilateral (DVT)  Result Date: 01/08/2022 CLINICAL DATA:  Bilateral lower extremity edema for 1 month. EXAM: Bilateral LOWER EXTREMITY VENOUS DOPPLER ULTRASOUND TECHNIQUE: Gray-scale sonography with compression, as well as color and duplex ultrasound, were performed to evaluate the deep venous system(s) from the level of the common femoral vein through the popliteal and proximal calf veins. COMPARISON:  No recent comparison imaging. Imaging from 2009 is available. FINDINGS: VENOUS Normal compressibility of the common femoral, superficial femoral, and popliteal veins, as well as the visualized calf veins. Visualized portions of profunda femoral vein and great saphenous vein unremarkable. No filling defects to suggest DVT on grayscale or color Doppler imaging. Doppler waveforms show normal direction of venous flow, normal respiratory plasticity and response to augmentation. OTHER Edema in the lower leg bilaterally. Limitations: Limited assessment of bilateral calf veins due to presence of lower extremity, lower leg edema. IMPRESSION: Negative for DVT. Mildly limited with respect to calf vein assessment due to lower leg edema. Electronically Signed   By: Zetta Bills M.D.   On: 01/08/2022 11:45     ASSESSMENT:  Normocytic anemia: - Patient seen at the request of Dr. Woody Seller - CBC on 06/12/2021 with hemoglobin 9.1, MCV 94 with normal white count and platelet count.  Creatinine was 1.4. - CBC on 05/26/2021: Hemoglobin-10, creatinine 1.56 - CBC on 02/24/2021: Hemoglobin-9.6 - He reports that he has received Feraheme in December 2020 in [place] without any major problems. - He is currently taking iron tablet twice daily.  Mild constipation. - Last colonoscopy in  2010 with polyps. - BMBX (09/02/2021): Non-Hodgkin's lymphoma, favoring MALT lymphoma.  MYD 88 was negative. - Cytogenetics: 64, XY. - Myeloma FISH panel: Negative. - FISH for low-grade lymphoma: Negative for BCL6 rearrangement, MALT1 rearrangement, t(11;14) and t(14;18).    Social/family history: - He lives at home with his wife.  He walks with help of walker since his left knee replacement and multiple infections following it. 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 - He quit smoking in 1984.  But chews tobacco at this time. - No family history of cancers.   PLAN:  [name]: - Bone marrow biopsy on 09/02/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 -  PET scan on 08/28/2021: Prominent metabolic activity in the left lower rectum and anorectal region with no skeletal lesions and no adenopathy. - He does not have any B symptoms.  He is also very weak from his other medical conditions.  I will reevaluate him in 3 months for follow-up.  2.  Normocytic anemia: - Anemia from CKD and relative iron deficiency.  Ferritin is 373 and percent saturation 13.  Hemoglobin is 9.3 with creatinine 1.86.  If hemoglobin drops below 9, will consider parenteral iron therapy.  3.  IgG kappa monoclonal gammopathy: - He has 1.4 g of IgG kappa M spike, likely from mild lymphoma.  PET scan did not show any lytic lesions.  4.  Rectal mass: -He underwent resection of the rectal mass by Dr. Johney Maine. 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  Lateral mucosal margins are negative for dysplasia.  5.  Severe hyponatremia: - I have reviewed his labs which showed sodium is 121.  His white count is also elevated at 14.8.  It is predominantly neutrophils.  I have recommended that he be evaluated in the ER for hyponatremia as well as infection.  He will be sent down to the  ER.  Orders placed this encounter:  Orders Placed This Encounter  Procedures   CBC with Differential   Lactate dehydrogenase   Comprehensive metabolic panel   Ferritin   Iron and TIBC ([facility] ONLY)      Derek Jack, MD [place] [phone]

## 2022-01-28 NOTE — Progress Notes (Signed)
Per Dr. Delton Coombes, patient taken to ER for evaluation of generalized malaise, blood in urine and sodium of 121.  Report given to charge nurse.  She advised I take him to ER registration, as there are no available beds at this time.  Transported via [place] with wife in stable condition.

## 2022-01-28 NOTE — Assessment & Plan Note (Addendum)
Sodium 122.  Baseline which was last checked about 2 weeks ago is low to mid 130s.  He is on Lasix 40 mg daily. -500 mill bolus given, continue normal saline at 75 cc/h -Urine sodium, urine osmolality -Obtain serum osmolality (s/p 500 mill bolus) -Hold Lasix for now

## 2022-01-28 NOTE — ED Notes (Signed)
Called to update daughter. Daughter did not answer the phone

## 2022-01-28 NOTE — ED Notes (Signed)
Daughter

## 2022-01-28 NOTE — ED Triage Notes (Signed)
Pt with recent mass removed from rectum,  pt with low Na levels.  Dr."K" instructed pt to come to ED.  Na is at 121 today.

## 2022-01-28 NOTE — Assessment & Plan Note (Addendum)
Stable and compensated.  Echo 11/2021 EF of 65 to 70%, with grade 2 DD. -Hold Lasix 40 mg daily for now with hyponatremia and metolazone.

## 2022-01-28 NOTE — ED Notes (Signed)
Lab at bedside

## 2022-01-28 NOTE — H&P (Signed)
History and Physical    Bradley Sheppard NKN:[id number] DOB: December 14, 1940 DOA: 01/28/2022  PCP: [name], MD   Patient coming from: [facility] rehab  I have personally briefly reviewed patient's old medical records in [place]  Chief Complaint: Malaise, low sodium  HPI: Bradley Sheppard is a 81 y.o. male with medical history significant for hypertension, diabetes mellitus, CKD 3B, diastolic CHF, rectal mass, MALT lymphoma.  Patient was sent to the ED from his oncologist - Dr. Tomie China office with reports of feeling unwell, and low sodium of 121 and blood in urine.  Recent hospitalization 9/7 - 9/12 for CHF exacerbation.  Treated with IV Lasix.  Patient also had a rectal mass and is status post partial proctectomy August 30 -transanal endoscopic microsurgery by Dr. Johney Maine.  With removal of a bulky sessile but soft rectal mass. He subsequently had some rectal bleeding but his hemoglobin has remained stable.    Patient reports that since the procedure, he has had burning in his rectum. He otherwise denies headaches, no nausea no vomiting.  No cough no fevers no difficulty breathing.  He has a chronic indwelling Foley catheter.  He reports pain in his lower abdomen.  ED Course: Temperature 97.6.  Heart rate ranging mostly from 72-115.  Respiratory rate 11-24.  Blood pressure systolic 37-902. Leukocytosis of 15.3.  Sodium 122. CT abdomen and pelvis with perivesical stranding raising concern for cystitis.  AlsoPatulous appearance of the distal rectum falling polypectomy versus is contained rectal wall violation/perforation following polypectomy associated with abundant stranding in the mesorectum. IV Zosyn started. EDP talked to Dr. Maryella Shivers general surgeon on call, would prefer that patient be admitted to [facility].  Review of Systems: As per HPI all other systems reviewed and negative.  Past Medical History:  Diagnosis Date   (HFpEF) heart failure with  preserved ejection fraction (HCC)    Anxiety    Aortic stenosis    Arthritis    Asthma    Chronic pain    CKD (chronic kidney disease) stage 3, GFR 30-59 ml/min (HCC)    Coronary atherosclerosis of native coronary artery    Multvessel, DES to LAD and RCA 8/03; EF 65% by echo 11/2014   DM2 (diabetes mellitus, type 2) ([place])    Essential hypertension    Gastric ulcer    Gastritis    Related to NSAIDs   GERD (gastroesophageal reflux disease)    Iron deficiency anemia    Mitral valve regurgitation    Mixed hyperlipidemia    Myocardial infarction [facility])    Nephrolithiasis     Past Surgical History:  Procedure Laterality Date   APPENDECTOMY     BIOPSY  09/25/2021   Procedure: BIOPSY;  Surgeon: Daneil Dolin, MD;  Location: [facility] END[facility] SUITE;  Service: Endoscopy;;   CARDIAC CATHETERIZATION  01/2002   90% obstruction in proximal LAD, 60 % obstruction in proximal circumflex and 70%  in proximal RCA. LAD & RCA stented  stents x 2   COLONOSCOPY WITH PROPOFOL N/A 09/25/2021   Procedure: COLONOSCOPY WITH PROPOFOL;  Surgeon: Daneil Dolin, MD;  Location: [facility];  Service: Endoscopy;  Laterality: N/A;  11:00am   L knee replacement  2009   Subsequent infectino requiring resectino arthroplasty with incision and drainage 8/10, and reimplantizathion arthroplasty, 9/20. 5 total surgeries.   PARTIAL PROCTECTOMY BY TEM N/A 12/31/2021   Procedure: TEM PARTIAL PROTECTOMY;  Surgeon: Michael Boston, MD;  Location: [facility] ORS;  Service: General;  Laterality: N/A;     reports that he has quit smoking. His smoking use included cigarettes. He has never been exposed to tobacco smoke. His smokeless tobacco use includes chew. He reports that he does not drink alcohol and does not use drugs.  Allergies  Allergen Reactions   Nsaids Other (See Comments)    On blood thinners with h/o IDA/bleeding   Prednisone Other (See Comments)    "makes him feel faint", hyperglycemic     Family History  Problem Relation  Age of Onset   Diabetes Mother    Aneurysm Mother        Brain   Stroke Father    Stroke Other    Colon cancer Neg Hx    Prior to Admission medications   Medication Sig Start Date End Date Taking? Authorizing Provider  acetaminophen (TYLENOL) 500 MG tablet Take 1,000 mg by mouth every 6 (six) hours as needed for moderate pain.   Yes [provider]  Calcium Polycarbophil (FIBER-LAX PO) Take 1 capsule by mouth daily.   Yes [provider]  carvedilol (COREG) 12.5 MG tablet Take 1 tablet (12.5 mg total) by mouth 2 (two) times daily with a meal. 01/12/22  Yes Clemons Salvucci, Courage, MD  furosemide (LASIX) 40 MG tablet Take 1 tablet (40 mg total) by mouth daily. 12/02/21  Yes Tat, Shanon Brow, MD  glipiZIDE (GLUCOTROL XL) 10 MG 24 hr tablet Take 10 mg by mouth 2 (two) times daily. 12/19/20  Yes [provider]  loperamide (IMODIUM) 2 MG capsule Take 1 capsule (2 mg total) by mouth at bedtime. 01/12/22  Yes Sher Hellinger, Courage, MD  LORazepam (ATIVAN) 0.5 MG tablet Take 1 tablet (0.5 mg total) by mouth at bedtime. 01/12/22  Yes Thaily Hackworth, Courage, MD  metFORMIN (GLUCOPHAGE) 500 MG tablet Take 500-1,000 mg by mouth See admin instructions. Take 1000 mg in the morning and 500 mg at night 08/16/21  Yes [provider]  metolazone (ZAROXOLYN) 5 MG tablet Take 1 tablet (5 mg total) by mouth See admin instructions. Take 1 every Tuesdays and Fridays 01/12/22  Yes Eldrick Penick, Courage, MD  nitroGLYCERIN (NITROSTAT) 0.4 MG SL tablet Place 1 tablet (0.4 mg total) under the tongue every 5 (five) minutes as needed. 06/30/21  Yes Satira Sark, MD  oxyCODONE (OXY IR/ROXICODONE) 5 MG immediate release tablet Take 0.5-1 tablets (2.5-5 mg total) by mouth every 6 (six) hours as needed for moderate pain, severe pain or breakthrough pain. 01/12/22  Yes Devonda Pequignot, Courage, MD  pioglitazone (ACTOS) 45 MG tablet Take 45 mg by mouth daily.   Yes [provider]  rosuvastatin (CRESTOR) 5 MG tablet Take 5 mg  by mouth every Wednesday. 12/02/21  Yes [provider]  tamsulosin (FLOMAX) 0.4 MG CAPS capsule Take 1 capsule (0.4 mg total) by mouth daily. 01/02/22  Yes Michael Boston, MD  tiZANidine (ZANAFLEX) 2 MG tablet Take 2 mg by mouth 2 (two) times daily.   Yes [provider]  vitamin B-12 (CYANOCOBALAMIN) 500 MCG tablet Take 500 mcg by mouth daily.   Yes [provider]  vitamin C (ASCORBIC ACID) 500 MG tablet Take 500 mg by mouth at bedtime.   Yes [provider]  Zinc Oxide 40 % PSTE Apply 1 Application topically as needed. Apply to buttock and sacrum if silicone foam is not effective due to incontinence 01/12/22  Yes Cathlyn Tersigni, Courage, MD  aspirin EC 81 MG tablet Take 1 tablet (81 mg total) by mouth daily with breakfast. Swallow whole. Patient  not taking: Reported on 01/28/2022 01/12/22   Roxan Hockey, MD    Physical Exam: Vitals:   01/28/22 1630 01/28/22 1640 01/28/22 1700 01/28/22 1730  BP: 100/89  119/83 136/74  Pulse: (!) 107  79 (!) 108  Resp: '13  16 14  '$ Temp:  97.8 F (36.6 C)    TempSrc:  Oral    SpO2: 100%  99% 100%  Weight:      Height:        Constitutional: NAD, calm, comfortable Vitals:   01/28/22 1630 01/28/22 1640 01/28/22 1700 01/28/22 1730  BP: 100/89  119/83 136/74  Pulse: (!) 107  79 (!) 108  Resp: '13  16 14  '$ Temp:  97.8 F (36.6 C)    TempSrc:  Oral    SpO2: 100%  99% 100%  Weight:      Height:       Eyes: PERRL, lids and conjunctivae normal ENMT: Mucous membranes are moist.   Neck: normal, supple, no masses, no thyromegaly Respiratory: clear to auscultation bilaterally, no wheezing, no crackles. Normal respiratory effort. No accessory muscle use.  Cardiovascular: Regular rate and rhythm, no murmurs / rubs / gallops. No extremity edema.  Extremities warm Abdomen: Moderate Lower abdominal tenderness, no masses palpated. No hepatosplenomegaly.   Chronic Foley. Musculoskeletal: no clubbing / cyanosis. No joint deformity upper  and lower extremities. = Skin: no rashes, lesions, ulcers. No induration Neurologic: Speech fluent, no facial asymmetry, able to move all extremities spontaneously.  Psychiatric: Normal judgment and insight. Alert and oriented x 3. Normal mood.   Labs on Admission: I have personally reviewed following labs and imaging studies  CBC: Recent Labs  Lab 01/28/22 1008 01/28/22 1337  WBC 14.8* 15.3*  NEUTROABS 10.9* 11.2*  HGB 9.3* 9.6*  HCT 26.9* 28.1*  MCV 89.1 89.2  PLT 387 341   Basic Metabolic Panel: Recent Labs  Lab 01/28/22 1008 01/28/22 1337  NA 121* 122*  K 4.5 4.6  CL 87* 86*  CO2 23 24  GLUCOSE 240* 169*  BUN 52* 52*  CREATININE 1.86* 1.91*  CALCIUM 7.9* 8.2*  MG  --  1.9   GFR: Estimated Creatinine Clearance: 32.2 mL/min (A) (by C-G formula based on SCr of 1.91 mg/dL (H)). Liver Function Tests: Recent Labs  Lab 01/28/22 1008 01/28/22 1337  AST 21 22  ALT 17 16  ALKPHOS 81 81  BILITOT 0.7 0.8  PROT 6.3* 6.6  ALBUMIN 2.4* 2.4*   Anemia Panel: Recent Labs    01/28/22 1015  FERRITIN 373*  TIBC 199*  IRON 25*   Urine analysis:    Component Value Date/Time   COLORURINE YELLOW 01/28/2022 1455   APPEARANCEUR HAZY (A) 01/28/2022 1455   LABSPEC 1.010 01/28/2022 1455   PHURINE 5.0 01/28/2022 1455   GLUCOSEU NEGATIVE 01/28/2022 1455   HGBUR MODERATE (A) 01/28/2022 1455   BILIRUBINUR NEGATIVE 01/28/2022 1455   KETONESUR NEGATIVE 01/28/2022 1455   PROTEINUR NEGATIVE 01/28/2022 1455   UROBILINOGEN 0.2 12/26/2008 0950   NITRITE NEGATIVE 01/28/2022 1455   LEUKOCYTESUR LARGE (A) 01/28/2022 1455    Radiological Exams on Admission: CT ABDOMEN PELVIS W CONTRAST  Addendum Date: 01/28/2022   ADDENDUM REPORT: 01/28/2022 15:49 ADDENDUM: These results were called by telephone at the time of interpretation on 01/28/2022 at 3:48 pm to provider HAYLEY NAASZ , who verbally acknowledged these results. Electronically Signed   By: Zetta Bills M.D.   On: 01/28/2022  15:49   Result Date: 01/28/2022 CLINICAL DATA:  Acute abdominal pain,  nonlocalized abdominal pain in an 81 year old. Blood in urine. Post recent rectal biopsy. History of lymphoma. * Tracking Code: BO * EXAM: CT ABDOMEN AND PELVIS WITH CONTRAST TECHNIQUE: Multidetector CT imaging of the abdomen and pelvis was performed using the standard protocol following bolus administration of intravenous contrast. RADIATION DOSE REDUCTION: This exam was performed according to the departmental dose-optimization program which includes automated exposure control, adjustment of the mA and/or kV according to patient size and/or use of iterative reconstruction technique. CONTRAST:  73m OMNIPAQUE IOHEXOL 300 MG/ML  SOLN COMPARISON:  November 28, 2021 FINDINGS: Lower chest: Basilar atelectasis. No effusion or consolidative changes. Calcified coronary artery disease, incompletely evaluated. Small pericardial effusion of similar volume to previous imaging. Hepatobiliary: No focal, suspicious hepatic lesion. No pericholecystic stranding. No biliary duct dilation. Portal vein is patent. Pancreas: Mild atrophy of the pancreas without ductal dilation, inflammation or visible lesion. Spleen: Normal. Adrenals/Urinary Tract: Cortical scarring of the bilateral kidneys without visible lesion or hydronephrosis. Urinary bladder is decompressed with Foley catheter in place. There is substantial perivesical stranding. There is no hydronephrosis. Adrenal glands are normal. Stomach/Bowel: Stomach without signs of adjacent stranding. No small bowel dilation. Colonic diverticulosis. Stranding about the rectum. Perirectal stranding is moderate to marked along the LEFT rectal wall. There is hypoenhancement of the LEFT rectal wall. Stranding extends to the pelvic floor towards the upper margin of the anal sphincter. Integrity of the distal rectum just above the pelvic floor is difficult to assess. Patulous appearance of the distal rectum abutting the  posterior prostate and puborectalis muscle on the LEFT just below the level of greatest stranding. No disseminated gas about the mesorectum. Vascular/Lymphatic: Aortic atherosclerosis. No sign of aneurysm. Smooth contour of the IVC. There is no gastrohepatic or hepatoduodenal ligament lymphadenopathy. No retroperitoneal or mesenteric lymphadenopathy. No pelvic sidewall lymphadenopathy. Reproductive: Prostate unremarkable by CT. Other: No pneumoperitoneum.  No ascites. Musculoskeletal: Levoconvex curvature of the thoracic/lumbar spine. Degenerative changes of the spine. No focal destructive bone lesion. IMPRESSION: 1. Perivesical stranding raising concern for cystitis. Correlate with urinalysis. This is associated with bladder wall thickening as well. 2. Patulous appearance of the distal rectum falling polypectomy versus is contained rectal wall violation/perforation following polypectomy associated with abundant stranding in the mesorectum. There is no disseminated gas or focal fluid. There is abundant stranding above the site in the pelvis. The somewhat angulated appearance of gas which abuts the posterior prostate and puborectalis muscle raises the question of violation of the rectal wall. Close follow-up is suggested with consideration for water-soluble contrast enema performed other with fluoroscopy or CT. Based on low position of the abnormality just above the sphincter complex would suggest utilization of Foley catheter rather than larger rectal tube if this is performed. 3. Colonic diverticulosis without evidence of acute diverticulitis. 4. Small pericardial effusion of similar volume to previous imaging. 5. Calcified coronary artery disease, incompletely evaluated. 6. Aortic atherosclerosis. Electronically Signed: By: GZetta BillsM.D. On: 01/28/2022 15:39    EKG: Independently reviewed.  Atrial fibrillation, with old LBBB.  Rate 88.  QTc 434.  No significant ST or T wave changes from  prior.  Assessment/Plan Principal Problem:   Hyponatremia Active Problems:   Moderate-Severe Aortic stenosis   DMII (diabetes mellitus, type 2) (HCC)   Rectal mass   Essential hypertension, benign   MALT lymphoma (HCC)   Chronic diastolic heart failure (HCC)   Chronic kidney disease, stage 3b ([place]   Urinary retention   Sepsis ([place]   Atrial fibrillation ([place]  Assessment and Plan: * Hyponatremia Sodium 122.  Baseline which was last checked about 2 weeks ago is low to mid 130s.  He is on Lasix 40 mg daily. -500 mill bolus given, continue normal saline at 75 cc/h -Urine sodium, urine osmolality -Obtain serum osmolality (s/p 500 mill bolus) -Hold Lasix for now  DMII (diabetes mellitus, type 2) (HCC) A1c 7.2 - SSI- M -Hold glipizide Actos metformin  Rectal mass Patient is status post partial proctectomy August 30 -transanal endoscopic microsurgery by Dr. Johney Maine.  With removal of a bulky sessile but soft rectal mass. -He has had burning from the site since procedure. -CT today- Patulous appearance of the distal rectum falling polypectomy versus is contained rectal wall violation/perforation following polypectomy associated with abundant stranding in the mesorectum. There is no disseminated gas or focal fluid. There is abundant stranding above the site in the pelvis. - EDP talked to Dr. Bobbye Morton on-call for [facility] surgery, admit to [facility] long -IV Zosyn - NPO midnight pending Gen Surg eval  Atrial fibrillation ([place]) EKG showing atrial fibrillation rate 88, QTc 434.  With old LBBB.  He is not on anticoagulation.  CHADs2VASC score- at least 4.  I talked to patient's daughter who is a Marine scientist, patient has a history of atrial fibrillation, he has a history of frequent falls.  He is on aspirin only.  He is not on anticoagulation because of the falls. -Continue aspirin.  Sepsis ([place]) Meeting sepsis criteria with leukocytosis of 15.3, and tachycardia heart rate 93-115.  UA  suggestive of UTI with large leukocytes.  He has a chronic indwelling Foley. CT also showing abundant stranding in the mesorectum. No recent urine cultures.  -Check lactic acid -Obtain blood cultures, urine cultures -500 bolus given, continue maintenance fluids at 75 -Continue IV Zosyn  Urinary retention Has chronic indwelling Foley catheter. Inserted-  8/30 for urinary retention.   -Obtain urine cultures, IV zosyn  Chronic kidney disease, stage 3b (HCC) Creatinine 1.9, about baseline 1.5-1.9.  Chronic diastolic heart failure (HCC) Stable and compensated.  Echo 11/2021 EF of 65 to 70%, with grade 2 DD. -Hold Lasix 40 mg daily for now with hyponatremia and metolazone.    DVT prophylaxis: SCDS for now, pending gen surg eval Code Status: Full code.  Confirmed with daughter Rogers Blocker on the phone.  Family Communication: None at bedside.  The patient's daughter - Rica Koyanagi, on the phone. Disposition Plan: > 2 days Consults called: Gen Surg Admission status: Inpt Tele I certify that at the point of admission it is my clinical judgment that the patient will require inpatient hospital care spanning beyond 2 midnights from the point of admission due to high intensity of service, high risk for further deterioration and high frequency of surveillance required.    Author: Bethena Roys, MD 01/28/2022 10:46 PM  For on call review [email or web address].

## 2022-01-28 NOTE — Assessment & Plan Note (Signed)
Creatinine 1.9, about baseline 1.5-1.9.

## 2022-01-28 NOTE — ED Notes (Signed)
Antibiotics started after cultures were drawn

## 2022-01-28 NOTE — Assessment & Plan Note (Addendum)
Has chronic indwelling Foley catheter. Inserted-  8/30 for urinary retention.   -Obtain urine cultures, IV zosyn

## 2022-01-28 NOTE — ED Notes (Signed)
Pt facility called and was updated.

## 2022-01-28 NOTE — ED Provider Notes (Signed)
[facility] EMERGENCY DEPARTMENT Provider Note   CSN: [id number] Arrival date & time: 01/28/22  1203     History  Chief Complaint  Patient presents with   Abnormal Lab    Bradley Sheppard is a 81 y.o. male with MALT lymphoma stage IV, MGUS, T2DM, HTN, CKD stage III, chronic diastolic heart failure, bilateral pleural effusions, rectal mass, ILD, HLD, CAD, aortic stenosis, IDA, history urinary retention presents with abnormal lab.   Pt with recent mass removed from rectum at the end of august. Was sent to ED today from PCP  pt with low Na levels.  Dr."K" instructed pt to come to ED after Na was found to be 121 today. Patient is currently staying at a rehab facility. Endorses significant fatigue.   Reports that he has had some rectal pain and pain around sacral decub ulcer but no abdominal pain, N/V. Endorses diarrhea and fecal incontinence. No fevers/chills, CP, palpitations. Endorses periodic SOB. Post-op course has been complicated by urinary retention now with Foley in place.  Wife states that patient woke up last night and the bed was wet with urine because the Foley had dislodged.  Home health nurse placed a new Foley. Then began to have blood in foley bag. No associated pain in penis or suprapubic region. Per chart review, patient was recently in the hospital from 01/08/2022 through 01/13/2022 for CHF exacerbation.  Wife reports that his sodium was low at 126 in the past few days.    HPI     Home Medications Prior to Admission medications   Medication Sig Start Date End Date Taking? Authorizing Provider  acetaminophen (TYLENOL) 500 MG tablet Take 1,000 mg by mouth every 6 (six) hours as needed for moderate pain.    [provider]  aspirin EC 81 MG tablet Take 1 tablet (81 mg total) by mouth daily with breakfast. Swallow whole. 01/12/22   Roxan Hockey, MD  carvedilol (COREG) 12.5 MG tablet Take 1 tablet (12.5 mg total) by mouth 2 (two) times daily with a meal. 01/12/22    Emokpae, Courage, MD  ferrous sulfate 324 MG TBEC Take 324 mg by mouth 2 (two) times daily.    [provider]  furosemide (LASIX) 40 MG tablet Take 1 tablet (40 mg total) by mouth daily. 12/02/21   Orson Eva, MD  glipiZIDE (GLUCOTROL XL) 10 MG 24 hr tablet Take 10 mg by mouth 2 (two) times daily. 12/19/20   [provider]  loperamide (IMODIUM) 2 MG capsule Take 1 capsule (2 mg total) by mouth at bedtime. 01/12/22   Roxan Hockey, MD  LORazepam (ATIVAN) 0.5 MG tablet Take 1 tablet (0.5 mg total) by mouth at bedtime. 01/12/22   Roxan Hockey, MD  metFORMIN (GLUCOPHAGE) 500 MG tablet Take 500-1,000 mg by mouth See admin instructions. Take 1000 mg in the morning and 500 mg at night 08/16/21   [provider]  metolazone (ZAROXOLYN) 5 MG tablet Take 1 tablet (5 mg total) by mouth See admin instructions. Take 1 every Tuesdays and Fridays 01/12/22   Roxan Hockey, MD  nitroGLYCERIN (NITROSTAT) 0.4 MG SL tablet Place 1 tablet (0.4 mg total) under the tongue every 5 (five) minutes as needed. Patient not taking: Reported on 01/28/2022 06/30/21   Satira Sark, MD  oxyCODONE (OXY IR/ROXICODONE) 5 MG immediate release tablet Take 0.5-1 tablets (2.5-5 mg total) by mouth every 6 (six) hours as needed for moderate pain, severe pain or breakthrough pain. 01/12/22   Roxan Hockey, MD  pioglitazone (ACTOS) 45 MG tablet Take 45 mg by mouth daily.    [provider]  polycarbophil (FIBERCON) 625 MG tablet Take 1 tablet (625 mg total) by mouth daily. 01/12/22   Roxan Hockey, MD  rosuvastatin (CRESTOR) 5 MG tablet Take 5 mg by mouth every Wednesday. 12/02/21   [provider]  tamsulosin (FLOMAX) 0.4 MG CAPS capsule Take 1 capsule (0.4 mg total) by mouth daily. 01/02/22   Michael Boston, MD  tiZANidine (ZANAFLEX) 2 MG tablet Take 2 mg by mouth 2 (two) times daily.    [provider]  vitamin B-12 (CYANOCOBALAMIN) 500 MCG tablet Take 500 mcg by mouth daily.     [provider]  vitamin C (ASCORBIC ACID) 500 MG tablet Take 500 mg by mouth at bedtime.    [provider]  Zinc Oxide 40 % PSTE Apply 1 Application topically as needed. Apply to buttock and sacrum if silicone foam is not effective due to incontinence 01/12/22   Roxan Hockey, MD      Allergies    Nsaids and Prednisone    Review of Systems   Review of Systems Review of systems negative for f/c.  A 10 point review of systems was performed and is negative unless otherwise reported in HPI.  Physical Exam Updated Vital Signs BP 114/70   Pulse (!) 112   Temp 97.6 F (36.4 C) (Oral)   Resp 20   Ht 6' (1.829 m)   Wt 85.7 kg   SpO2 100%   BMI 25.63 kg/m  Physical Exam General: Chronically ill-appearing male, lying in bed.  HEENT: PERRLA, Sclera anicteric, MMM, trachea midline. Cardiology: RRR, no murmurs/rubs/gallops. BL radial and DP pulses equal bilaterally.  Resp: Normal respiratory rate and effort. CTAB, no wheezes, rhonchi, crackles.  Abd: Soft, non-tender, non-distended. No rebound tenderness or guarding.  GU: Deferred. MSK: No peripheral edema or signs of trauma. Extremities without deformity or TTP. No cyanosis or clubbing. Skin: warm, dry. No rashes or lesions. Neuro: A&Ox4, CNs II-XII grossly intact. MAEs. Sensation grossly intact.  Psych: Normal mood and affect.   ED Results / Procedures / Treatments   Labs (all labs ordered are listed, but only abnormal results are displayed) Labs Reviewed  CBC WITH DIFFERENTIAL/PLATELET - Abnormal; Notable for the following components:      Result Value   WBC 15.3 (*)    RBC 3.15 (*)    Hemoglobin 9.6 (*)    HCT 28.1 (*)    Neutro Abs 11.2 (*)    Abs Immature Granulocytes 0.09 (*)    All other components within normal limits  COMPREHENSIVE METABOLIC PANEL - Abnormal; Notable for the following components:   Sodium 122 (*)    Chloride 86 (*)    Glucose, Bld 169 (*)    BUN 52 (*)    Creatinine, Ser 1.91  (*)    Calcium 8.2 (*)    Albumin 2.4 (*)    GFR, Estimated 34 (*)    All other components within normal limits  MAGNESIUM  URINALYSIS, ROUTINE W REFLEX MICROSCOPIC  SODIUM, URINE, RANDOM     Radiology CT ABDOMEN PELVIS W CONTRAST  Result Date: 01/28/2022 CLINICAL DATA:  Acute abdominal pain, nonlocalized abdominal pain in an 81 year old. Blood in urine. Post recent rectal biopsy. History of lymphoma. * Tracking Code: BO * EXAM: CT ABDOMEN AND PELVIS WITH CONTRAST TECHNIQUE: Multidetector CT imaging of the abdomen and pelvis was performed using the standard protocol following bolus administration of intravenous contrast. RADIATION  DOSE REDUCTION: This exam was performed according to the departmental dose-optimization program which includes automated exposure control, adjustment of the mA and/or kV according to patient size and/or use of iterative reconstruction technique. CONTRAST:  4m OMNIPAQUE IOHEXOL 300 MG/ML  SOLN COMPARISON:  November 28, 2021 FINDINGS: Lower chest: Basilar atelectasis. No effusion or consolidative changes. Calcified coronary artery disease, incompletely evaluated. Small pericardial effusion of similar volume to previous imaging. Hepatobiliary: No focal, suspicious hepatic lesion. No pericholecystic stranding. No biliary duct dilation. Portal vein is patent. Pancreas: Mild atrophy of the pancreas without ductal dilation, inflammation or visible lesion. Spleen: Normal. Adrenals/Urinary Tract: Cortical scarring of the bilateral kidneys without visible lesion or hydronephrosis. Urinary bladder is decompressed with Foley catheter in place. There is substantial perivesical stranding. There is no hydronephrosis. Adrenal glands are normal. Stomach/Bowel: Stomach without signs of adjacent stranding. No small bowel dilation. Colonic diverticulosis. Stranding about the rectum. Perirectal stranding is moderate to marked along the LEFT rectal wall. There is hypoenhancement of the LEFT rectal  wall. Stranding extends to the pelvic floor towards the upper margin of the anal sphincter. Integrity of the distal rectum just above the pelvic floor is difficult to assess. Patulous appearance of the distal rectum abutting the posterior prostate and puborectalis muscle on the LEFT just below the level of greatest stranding. No disseminated gas about the mesorectum. Vascular/Lymphatic: Aortic atherosclerosis. No sign of aneurysm. Smooth contour of the IVC. There is no gastrohepatic or hepatoduodenal ligament lymphadenopathy. No retroperitoneal or mesenteric lymphadenopathy. No pelvic sidewall lymphadenopathy. Reproductive: Prostate unremarkable by CT. Other: No pneumoperitoneum.  No ascites. Musculoskeletal: Levoconvex curvature of the thoracic/lumbar spine. Degenerative changes of the spine. No focal destructive bone lesion. IMPRESSION: 1. Perivesical stranding raising concern for cystitis. Correlate with urinalysis. This is associated with bladder wall thickening as well. 2. Patulous appearance of the distal rectum falling polypectomy versus is contained rectal wall violation/perforation following polypectomy associated with abundant stranding in the mesorectum. There is no disseminated gas or focal fluid. There is abundant stranding above the site in the pelvis. The somewhat angulated appearance of gas which abuts the posterior prostate and puborectalis muscle raises the question of violation of the rectal wall. Close follow-up is suggested with consideration for water-soluble contrast enema performed other with fluoroscopy or CT. Based on low position of the abnormality just above the sphincter complex would suggest utilization of Foley catheter rather than larger rectal tube if this is performed. 3. Colonic diverticulosis without evidence of acute diverticulitis. 4. Small pericardial effusion of similar volume to previous imaging. 5. Calcified coronary artery disease, incompletely evaluated. 6. Aortic  atherosclerosis. Electronically Signed   By: GZetta BillsM.D.   On: 01/28/2022 15:39    Procedures Procedures    Medications Ordered in ED Medications  iohexol (OMNIPAQUE) 300 MG/ML solution 100 mL (80 mLs Intravenous Contrast Given 01/28/22 1504)    ED Course/ Medical Decision Making/ A&P                          Medical Decision Making Amount and/or Complexity of Data Reviewed Labs: ordered. Decision-making details documented in ED Course. Radiology: ordered.  Risk Prescription drug management. Decision regarding hospitalization.  Patient is tachycardic but afebrile.   Consider electrolyte abnormalities, renal injury, worsening anemia. Consider postop infection and will obtain CT abd/pelvis. Patient is tachycardic, consider hypovolemia vs sepsis. Consider other intraadominal infection such as diverticulitis, appendicitis, UTI/pyelonephritis. Consider UTI or urethral injury from foley given report of hematuria  after reportedly dislodging the prior foley and inserting another; will obtain UA. For known hyponatremia, consider renal causes such as RTA or AKI vs extra renal causes such as GI loss, as patient is most likely hypovolemic. Will need to obtain further labs for w/u. Pain control with dilaudid IV.   Labs demonstrate Sodium 121 and now 122. Hgb 9.6, K 4.6, Cr 1.91 (at approx baseline), Mg 1.9, WBC 15.3. UA demonstrates moderate Hgb, large leukocytes, ng nitrites, few bacteria, RBC 11-20, and large WBC with clumps.   I have personally reviewed and interpreted all labs and imaging.   Clinical Course as of 02/02/22 1457  Wed Jan 28, 2022  1423 WBC(!): 15.3 Increased from even this AM [HN]  1423 Hemoglobin(!): 9.6 [HN]  1424 Hemoglobin(!): 9.6 Up from prior [HN]  [facility] notes cystitis. Patulous rectum following polyp resection or possible rectal perforation. Will consult to surgery. [HN]  1629 Discussed case with Dr. Arnoldo Morale.  Dr. Johney Maine did the patient's procedure  he recommends I contact the [place] surgery office [JK]  1632 Patient did not want the Dilaudid.  Requested an oxycodone. [FT]  7322 Reviewed case with Dr Bobbye Morton.  He reviewed the CT scan findings with Dr. Johney Maine.  He is not too concerned as this is not somewhat unexpected after his procedure. [GU]  5427   Would prefer the patient to be admitted to [facility].  I will consult with hospitalist to arrange for that [JK]  1820 Case discussed with Dr. Denton Brick regarding admission [JK]    Clinical Course User Index [HN] Audley Hose, MD [JK] Dorie Rank, MD   Patient is signed out to the oncoming ED physician who is made aware of her history, presentation, exam, workup, and plan. Patient's CT scan demonstrated concern for cystitis (confirmed by UA) as well as possible rectal wall perforation. Patient is started on zosyn IV to cover UTI and intraabdominal infection and consulted to surgery. Plan is to await surgery recommendations and admit to medicine for hyponatremia, CAUTI.          Final Clinical Impression(s) / ED Diagnoses Final diagnoses:  Hyponatremia  Cystitis    Rx / DC Orders ED Discharge Orders     None        This note was created using dictation software, which may contain spelling or grammatical errors.    Audley Hose, MD 02/02/22 1520

## 2022-01-28 NOTE — ED Notes (Signed)
Henrene Hawking Daughter Please Call With update    [phone]

## 2022-01-28 NOTE — Assessment & Plan Note (Addendum)
EKG showing atrial fibrillation rate 88, QTc 434.  With old LBBB.  He is not on anticoagulation.  CHADs2VASC score- at least 4.  I talked to patient's daughter who is a Marine scientist, patient has a history of atrial fibrillation, he has a history of frequent falls.  He is on aspirin only.  He is not on anticoagulation because of the falls. -Continue aspirin.

## 2022-01-28 NOTE — ED Provider Notes (Signed)
Clinical Course as of 01/28/22 1820  Wed Jan 28, 2022  1423 WBC(!): 15.3 Increased from even this AM [HN]  1423 Hemoglobin(!): 9.6 [HN]  1424 Hemoglobin(!): 9.6 Up from prior [HN]  [facility] notes cystitis. Patulous rectum following polyp resection or possible rectal perforation. Will consult to surgery. [HN]  1629 Discussed case with Dr. Arnoldo Morale.  Dr. Johney Maine did the patient's procedure he recommends I contact the [place] surgery office [JK]  1632 Patient did not want the Dilaudid.  Requested an oxycodone. [IO]  0355 Reviewed case with Dr Bobbye Morton.  He reviewed the CT scan findings with Dr. Johney Maine.  He is not too concerned as this is not somewhat unexpected after his procedure. [HR]  4163   Would prefer the patient to be admitted to [facility].  I will consult with hospitalist to arrange for that [JK]  1820 Case discussed with Dr. Denton Brick regarding admission [JK]    Clinical Course User Index [HN] Audley Hose, MD [JK] Dorie Rank, MD   Patient initially seen by Dr. Mayra Neer.  Please see her note.  As indicated in ED course.  I spoke with general surgery and the hospitalist service regarding admission.  Family is concerned about being admitted to [facility].  They would prefer to be admitted here.  I did explain to the family that they do not have to transfer him to [facility] long because the wife kept on mentioning he was weak and it would be hard for him to go there.  Discussed this with Dr. Denton Brick and she will discuss the options with the family.   Dorie Rank, MD 01/28/22 Vernelle Emerald

## 2022-01-28 NOTE — Assessment & Plan Note (Addendum)
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 Johney Maine.  With removal of a bulky sessile but soft rectal mass. -He has had burning from the site since procedure. -CT today- Patulous appearance of the distal rectum falling polypectomy versus is contained rectal wall violation/perforation following polypectomy associated with abundant stranding in the mesorectum. There is no disseminated gas or focal fluid. There is abundant stranding above the site in the pelvis. - EDP talked to Dr. Bobbye Morton on-call for [facility] surgery, admit to [facility] long -IV Zosyn - NPO midnight pending Gen Surg eval

## 2022-01-28 NOTE — ED Notes (Signed)
Pt C/O his bottom burning and his legs tingling. Pts bottom redressed and new sacrum pad placed along with xeroform. New brief also placed. Peri care done in process. Pt shifted from right side being propped up to left side to give more comfort. Pt asking for pain meds. This nurse tech informed pts nurse. Nurse notified.

## 2022-01-28 NOTE — Progress Notes (Signed)
Pharmacy Antibiotic Note  Bradley Sheppard is a 81 y.o. male admitted on 01/28/2022 with concern for cystitis.  Pharmacy has been consulted for Zosyn dosing.  Plan: Zosyn 3.375gm IV q8h Will f/u renal function, micro data, and pt's clinical condition  Height: 6' (182.9 cm) Weight: 85.7 kg (189 lb) IBW/kg (Calculated) : 77.6  Temp (24hrs), Avg:97.8 F (36.6 C), Min:97.6 F (36.4 C), Max:98.2 F (36.8 C)  Recent Labs  Lab 01/28/22 1008 01/28/22 1337  WBC 14.8* 15.3*  CREATININE 1.86* 1.91*    Estimated Creatinine Clearance: 32.2 mL/min (A) (by C-G formula based on SCr of 1.91 mg/dL (H)).    Allergies  Allergen Reactions   Nsaids Other (See Comments)    On blood thinners with h/o IDA/bleeding   Prednisone Other (See Comments)    "makes him feel faint", hyperglycemic     Antimicrobials this admission: 9/27 Zosyn >>  Microbiology results: Pending  Thank you for allowing pharmacy to be a part of this patient's care.  Sherlon Handing, PharmD, BCPS Please see amion for complete clinical pharmacist phone list 01/28/2022 10:44 PM

## 2022-01-28 NOTE — Assessment & Plan Note (Addendum)
Meeting sepsis criteria with leukocytosis of 15.3, and tachycardia heart rate 93-115.  UA suggestive of UTI with large leukocytes.  He has a chronic indwelling Foley. CT also showing abundant stranding in the mesorectum. No recent urine cultures.  -Check lactic acid -Obtain blood cultures, urine cultures -500 bolus given, continue maintenance fluids at 75 -Continue IV Zosyn

## 2022-01-28 NOTE — Patient Instructions (Signed)
[facility]  Discharge Instructions  You were seen and examined today by Dr. Delton Coombes.  Your sodium is dangerously low. Please go to the ER.   Thank you for choosing [facility] to provide your oncology and hematology care.   To afford each patient quality time with our provider, please arrive at least 15 minutes before your scheduled appointment time. You may need to reschedule your appointment if you arrive late (10 or more minutes). Arriving late affects you and other patients whose appointments are after yours.  Also, if you miss three or more appointments without notifying the office, you may be dismissed from the clinic at the provider's discretion.    Again, thank you for choosing [facility].  Our hope is that these requests will decrease the amount of time that you wait before being seen by our physicians.   If you have a lab appointment with the [facility] please come in thru the Main Entrance and check in at the main information desk.           _____________________________________________________________  Should you have questions after your visit to [facility], please contact our office at [phone] and follow the prompts.  Our office hours are 8:00 a.m. to 4:30 p.m. Monday - Thursday and 8:00 a.m. to 2:30 p.m. Friday.  Please note that voicemails left after 4:00 p.m. may not be returned until the following business day.  We are closed weekends and all major holidays.  You do have access to a nurse 24-7, just call the main number to the clinic [phone] and do not press any options, hold on the line and a nurse will answer the phone.    For prescription refill requests, have your pharmacy contact our office and allow 72 hours.    Masks are optional in the cancer centers. If you would like for your care team to wear a mask while they are taking care of you, please let them know. You may  have one support person who is at least 83 years old accompany you for your appointments.

## 2022-01-28 NOTE — Assessment & Plan Note (Signed)
A1c 7.2 - SSI- M -Hold glipizide Actos metformin

## 2022-01-29 ENCOUNTER — Ambulatory Visit: Payer: PPO | Admitting: Physician Assistant

## 2022-01-29 DIAGNOSIS — E871 Hypo-osmolality and hyponatremia: Secondary | ICD-10-CM | POA: Diagnosis not present

## 2022-01-29 LAB — BASIC METABOLIC PANEL
Anion gap: 6 (ref 5–15)
BUN: 46 mg/dL — ABNORMAL HIGH (ref 8–23)
CO2: 26 mmol/L (ref 22–32)
Calcium: 8 mg/dL — ABNORMAL LOW (ref 8.9–10.3)
Chloride: 93 mmol/L — ABNORMAL LOW (ref 98–111)
Creatinine, Ser: 1.71 mg/dL — ABNORMAL HIGH (ref 0.61–1.24)
GFR, Estimated: 39 mL/min — ABNORMAL LOW (ref >60)
Glucose, Bld: 80 mg/dL (ref 70–99)
Potassium: 4.2 mmol/L (ref 3.5–5.1)
Sodium: 125 mmol/L — ABNORMAL LOW (ref 135–145)

## 2022-01-29 LAB — CBC
HCT: 27.1 % — ABNORMAL LOW (ref 39.0–52.0)
Hemoglobin: 9.2 g/dL — ABNORMAL LOW (ref 13.0–17.0)
MCH: 30.3 pg (ref 26.0–34.0)
MCHC: 33.9 g/dL (ref 30.0–36.0)
MCV: 89.1 fL (ref 80.0–100.0)
Platelets: 387 10*3/uL (ref 150–400)
RBC: 3.04 MIL/uL — ABNORMAL LOW (ref 4.22–5.81)
RDW: 13.7 % (ref 11.5–15.5)
WBC: 13.5 10*3/uL — ABNORMAL HIGH (ref 4.0–10.5)
nRBC: 0 % (ref 0.0–0.2)

## 2022-01-29 LAB — GLUCOSE, CAPILLARY
Glucose-Capillary: 117 mg/dL — ABNORMAL HIGH (ref 70–99)
Glucose-Capillary: 136 mg/dL — ABNORMAL HIGH (ref 70–99)
Glucose-Capillary: 213 mg/dL — ABNORMAL HIGH (ref 70–99)
Glucose-Capillary: 90 mg/dL (ref 70–99)

## 2022-01-29 LAB — OSMOLALITY: Osmolality: 283 mOsm/kg (ref 275–295)

## 2022-01-29 LAB — BRAIN NATRIURETIC PEPTIDE: B Natriuretic Peptide: 527 pg/mL — ABNORMAL HIGH (ref 0.0–100.0)

## 2022-01-29 LAB — OSMOLALITY, URINE: Osmolality, Ur: 236 mOsm/kg — ABNORMAL LOW (ref 300–900)

## 2022-01-29 MED ORDER — ZINC OXIDE 40 % EX OINT
TOPICAL_OINTMENT | Freq: Two times a day (BID) | CUTANEOUS | Status: DC
  Filled 2022-01-29: qty 57

## 2022-01-29 MED ORDER — WITCH HAZEL-GLYCERIN EX PADS: MEDICATED_PAD | CUTANEOUS | Status: DC | PRN

## 2022-01-29 MED ORDER — SODIUM CHLORIDE 0.9 % IV SOLN
250.0000 mg | Freq: Every day | INTRAVENOUS | Status: AC
  Administered 2022-01-29 – 2022-01-30 (×2): 250 mg via INTRAVENOUS
  Filled 2022-01-29 (×3): qty 20

## 2022-01-29 MED ORDER — SODIUM CHLORIDE 0.9 % IV SOLN: 510.0000 mg | Freq: Once | INTRAVENOUS | Status: DC

## 2022-01-29 MED ORDER — SIMETHICONE 40 MG/0.6ML PO SUSP: 80.0000 mg | Freq: Four times a day (QID) | ORAL | Status: DC | PRN

## 2022-01-29 MED ORDER — LIP MEDEX EX OINT
TOPICAL_OINTMENT | Freq: Two times a day (BID) | CUTANEOUS | Status: DC
  Administered 2022-01-29 – 2022-01-31 (×5): 75 via TOPICAL
  Administered 2022-02-01: 1 via TOPICAL
  Administered 2022-02-02 – 2022-02-03 (×2): 75 via TOPICAL
  Filled 2022-01-29 (×4): qty 7

## 2022-01-29 MED ORDER — MAGIC MOUTHWASH: 15.0000 mL | Freq: Four times a day (QID) | ORAL | Status: DC | PRN

## 2022-01-29 MED ORDER — CHLORHEXIDINE GLUCONATE CLOTH 2 % EX PADS
6.0000 | MEDICATED_PAD | Freq: Every day | CUTANEOUS | Status: DC
  Administered 2022-01-29 – 2022-02-04 (×7): 6 via TOPICAL

## 2022-01-29 MED ORDER — ALUM & MAG HYDROXIDE-SIMETH 200-200-20 MG/5ML PO SUSP: 30.0000 mL | Freq: Four times a day (QID) | ORAL | Status: DC | PRN

## 2022-01-29 MED ORDER — CALCIUM POLYCARBOPHIL 625 MG PO TABS
625.0000 mg | ORAL_TABLET | Freq: Two times a day (BID) | ORAL | Status: DC
  Administered 2022-01-29 – 2022-02-04 (×12): 625 mg via ORAL
  Filled 2022-01-29 (×12): qty 1

## 2022-01-29 MED ORDER — MENTHOL 3 MG MT LOZG: 1.0000 | LOZENGE | OROMUCOSAL | Status: DC | PRN

## 2022-01-29 MED ORDER — ORAL CARE MOUTH RINSE: 15.0000 mL | OROMUCOSAL | Status: DC | PRN

## 2022-01-29 MED ORDER — FUROSEMIDE 10 MG/ML IJ SOLN
40.0000 mg | Freq: Two times a day (BID) | INTRAMUSCULAR | Status: DC
  Administered 2022-01-29 – 2022-02-01 (×7): 40 mg via INTRAVENOUS
  Filled 2022-01-29 (×7): qty 4

## 2022-01-29 MED ORDER — PHENOL 1.4 % MT LIQD: 2.0000 | OROMUCOSAL | Status: DC | PRN

## 2022-01-29 MED ORDER — OXYCODONE HCL 5 MG PO TABS
2.5000 mg | ORAL_TABLET | Freq: Four times a day (QID) | ORAL | Status: DC | PRN
  Administered 2022-01-29 – 2022-02-04 (×19): 5 mg via ORAL
  Filled 2022-01-29 (×19): qty 1

## 2022-01-29 NOTE — Progress Notes (Signed)
Call from [facility].. pt heart rat at [phone].Marland Kitchen Pt is sitting up in the bed eating break and denies any pain to chest or SOB.Marland Kitchen stated 'the only pain is in his bottom" This LPN  re positioned leads and sent attending notification

## 2022-01-29 NOTE — Progress Notes (Signed)
Report given to Raquel Sarna, RN on [facility]-5 E

## 2022-01-29 NOTE — Progress Notes (Addendum)
PROGRESS NOTE  Bradley Sheppard  KXF:[id number] DOB: Feb 18, 1941 DOA: 01/28/2022 PCP: [name], MD   Brief Narrative:  Patient is a 81 year old male with history of hypertension, diabetes type 2, CKD stage IIIb, diastolic CHF, rectal mass, MALT lymphoma who was sent to the emergency department by his oncologist after he presented with generalized weakness, malaise.  Lab work showed a sodium of 121.  Patient was recently admitted on September for CHF exacerbation and was treated with IV Lasix.  He also had partial proctectomy on August 30 for rectal mass through transanal endoscopic microsurgery by Dr. Johney Maine.  Patient was also complaining of persistent burning sensation in the rectum, lower abdominal pain but no fever or chills.  On presentation ,he was hemodynamically stable.  Lab work showed leukocytosis of 15.3.  Sodium of 122.  CT abdomen/pelvis showed perivesical stranding raising for concern of cystitis, possible rectal perforation.  General surgery consulted.  Plan is to transfer him to [facility].  Currently on broad-spectrum antibiotics.  Assessment & Plan:  Principal Problem:   Hyponatremia Active Problems:   Moderate-Severe Aortic stenosis   DMII (diabetes mellitus, type 2) (HCC)   Rectal mass   Essential hypertension, benign   MALT lymphoma (HCC)   Chronic diastolic heart failure (HCC)   Chronic kidney disease, stage 3b (HCC)   Urinary retention   Sepsis ([place])   Atrial fibrillation (HCC)  Hyponatremia: His sodium usually runs on the lower side, in 130s.  Patient is also on Lasix 40 mg daily at home.  Possibility of hypervolemic hyponatremia.  He has significant bilateral lower extremity edema.  We will discontinue fluid and start on IV Lasix 40 mg bid.We will check BMP  Rectal mass/suspicion for perforation: Patient is a status post prostatectomy on August 30 through transanal endoscopic microsurgery by Dr. Johney Maine with removal of rectal mass.  Patient was complaining of lower  abdominal pain, burning sensation.  CT abdomen/pelvis showed stranding, could not rule out perforation.  Discussed with general surgery, Dr. Bobbye Morton who requested the patient to be transferred to [facility]. Continue Zosyn.  On clear liquid diet  Sepsis: Presented with tachycardia, leukocytosis.  UA showed large leukocytes.  He has chronic indwelling Foley catheter.  CT abdomen/pelvis also showed stranding in the mesorectum.  Follow-up urine culture, blood culture.  Continue Zosyn.  Lactate acid  normal.  Continue gentle IV fluids  Acute exacerbation of heart failure with preserved ejection fraction: Patient echo showed EF of 65 to 70% with moderate diastolic dysfunction.  On Lasix and metolazone at home which is on hold.  Has lower extremity edema.elevated BNP  Hypertension: Currently above stable.  Continue to monitor.  On Coreg  Diabetes type 2: Recent hemoglobin A1c of 7.2.  Continue sliding scale insulin.  Takes glipizide, Actos, metformin at home.  Paroxysmal A-fib: Not on anticoagulation due to history of frequent falls.  On aspirin only.CHADs2VASC score- at least 4.  On telemetry.  On Coreg for rate control.Remains in afib with controlled rate  while at rest  CKD stage IIIb: Baseline creatinine around 1.6-1.9.  Currently kidney function.  History of coronary artery disease: No anginal symptoms.  Status post DES to LAD and RCA in 2003.  On aspirin, Coreg, Crestor at home.  History of Hodgkin's lymphoma: Follows with oncology.  Normocytic anemia: Hemoglobin stable in the range of 9.  Iron studies showed low iron, will give a dose of IV iron.  Urinary retention: Has chronic indwelling Foley catheter.  Pending urine culture  Pressure Injury 01/08/22 Buttocks Bilateral Stage 2 -  Partial thickness loss of dermis presenting as a shallow open injury with a red, pink wound bed without slough. (Active)  01/08/22 2018  Location: Buttocks  Location Orientation: Bilateral  Staging: Stage  2 -  Partial thickness loss of dermis presenting as a shallow open injury with a red, pink wound bed without slough.  Wound Description (Comments):   Present on Admission: Yes  Dressing Type Foam - Lift dressing to assess site every shift 01/29/22 0357    DVT prophylaxis:SCDs Start: 01/28/22 2252     Code Status: Full Code  Family Communication:Called and discussed with daughter Santiago Glad today  Patient status:Inpatient  Patient is from :Home  Anticipated discharge OM:VEHM  Estimated DC date:Not sure   Consultants: General surgery  Procedures: None  Antimicrobials:  Anti-infectives (From admission, onward)    Start     Dose/Rate Route Frequency Ordered Stop   01/28/22 2300  piperacillin-tazobactam (ZOSYN) IVPB 3.375 g        3.375 g 12.5 mL/hr over 240 Minutes Intravenous Every 8 hours 01/28/22 2247     01/28/22 2200  piperacillin-tazobactam (ZOSYN) IVPB 3.375 g  Status:  Discontinued        3.375 g 12.5 mL/hr over 240 Minutes Intravenous Every 8 hours 01/28/22 1603 01/28/22 2323   01/28/22 1800  piperacillin-tazobactam (ZOSYN) IVPB 3.375 g  Status:  Discontinued        3.375 g 12.5 mL/hr over 240 Minutes Intravenous Every 6 hours 01/28/22 1558 01/28/22 1600   01/28/22 1615  piperacillin-tazobactam (ZOSYN) IVPB 2.25 g  Status:  Discontinued        2.25 g 100 mL/hr over 30 Minutes Intravenous Every 8 hours 01/28/22 1600 01/28/22 1603   01/28/22 1615  piperacillin-tazobactam (ZOSYN) IVPB 3.375 g        3.375 g 100 mL/hr over 30 Minutes Intravenous  Once 01/28/22 1603 01/28/22 1712       Subjective: Patient seen and examined the bedside this morning.  He was resting comfortably on bed while I arrived.  At rest, his heart rate is well controlled.  He complains of lower abdominal discomfort.  Not in significant distress.  Alert and oriented.  Surprised to find that he ate his breakfast.  Diet changed to clear liquid.  Abdomen benign on examination  Objective: Vitals:    01/29/22 0200 01/29/22 0230 01/29/22 0318 01/29/22 0401  BP: 131/76 103/65  128/79  Pulse: (!) 112 98  90  Resp: '17 17  20  '$ Temp:   98.3 F (36.8 C) 97.6 F (36.4 C)  TempSrc:   Oral Oral  SpO2: 98% 97%  96%  Weight:      Height:        Intake/Output Summary (Last 24 hours) at 01/29/2022 0803 Last data filed at 01/29/2022 0700 Gross per 24 hour  Intake 1300.21 ml  Output 1800 ml  Net -499.79 ml   Filed Weights   01/28/22 1219  Weight: 85.7 kg    Examination:  General exam: Overall comfortable, not in distress, pleasant elderly male HEENT: PERRL Respiratory system:  no wheezes or crackles  Cardiovascular system: Irregularly irregular rhythm Gastrointestinal system: Abdomen is nondistended, soft .  Mildly tender in the lower abdomen Central nervous system: Alert and oriented Extremities: 2 + bilateral lower extremity pitting edema, no clubbing ,no cyanosis Skin: No rashes, no ulcers,no icterus   GU: Foley   Data Reviewed: I have personally reviewed following labs and imaging studies  CBC: Recent Labs  Lab 01/28/22 1008 01/28/22 1337 01/29/22 0459  WBC 14.8* 15.3* 13.5*  NEUTROABS 10.9* 11.2*  --   HGB 9.3* 9.6* 9.2*  HCT 26.9* 28.1* 27.1*  MCV 89.1 89.2 89.1  PLT 387 392 220   Basic Metabolic Panel: Recent Labs  Lab 01/28/22 1008 01/28/22 1337 01/29/22 0459  NA 121* 122* 125*  K 4.5 4.6 4.2  CL 87* 86* 93*  CO2 '23 24 26  '$ GLUCOSE 240* 169* 80  BUN 52* 52* 46*  CREATININE 1.86* 1.91* 1.71*  CALCIUM 7.9* 8.2* 8.0*  MG  --  1.9  --      Recent Results (from the past 240 hour(s))  Culture, blood (Routine X 2) w Reflex to ID Panel     Status: None (Preliminary result)   Collection Time: 01/28/22 10:37 PM   Specimen: Left Antecubital; Blood  Result Value Ref Range Status   Specimen Description LEFT ANTECUBITAL  Final   Special Requests   Final    BOTTLES DRAWN AEROBIC AND ANAEROBIC Blood Culture adequate volume   Culture   Final    NO GROWTH <12  HOURS Performed at [facility], [address] [place], [place] [address]    Report Status PENDING  Incomplete  Culture, blood (Routine X 2) w Reflex to ID Panel     Status: None (Preliminary result)   Collection Time: 01/28/22 10:37 PM   Specimen: BLOOD RIGHT HAND  Result Value Ref Range Status   Specimen Description BLOOD RIGHT HAND  Final   Special Requests   Final    BOTTLES DRAWN AEROBIC AND ANAEROBIC Blood Culture adequate volume   Culture   Final    NO GROWTH <12 HOURS Performed at [facility], [address], [place], [place] [address]    Report Status PENDING  Incomplete     Radiology Studies: CT ABDOMEN PELVIS W CONTRAST  Addendum Date: 01/28/2022   ADDENDUM REPORT: 01/28/2022 15:49 ADDENDUM: These results were called by telephone at the time of interpretation on 01/28/2022 at 3:48 pm to provider HAYLEY NAASZ , who verbally acknowledged these results. Electronically Signed   By: Zetta Bills M.D.   On: 01/28/2022 15:49   Result Date: 01/28/2022 CLINICAL DATA:  Acute abdominal pain, nonlocalized abdominal pain in an 81 year old. Blood in urine. Post recent rectal biopsy. History of lymphoma. * Tracking Code: BO * EXAM: CT ABDOMEN AND PELVIS WITH CONTRAST TECHNIQUE: Multidetector CT imaging of the abdomen and pelvis was performed using the standard protocol following bolus administration of intravenous contrast. RADIATION DOSE REDUCTION: This exam was performed according to the departmental dose-optimization program which includes automated exposure control, adjustment of the mA and/or kV according to patient size and/or use of iterative reconstruction technique. CONTRAST:  62m OMNIPAQUE IOHEXOL 300 MG/ML  SOLN COMPARISON:  November 28, 2021 FINDINGS: Lower chest: Basilar atelectasis. No effusion or consolidative changes. Calcified coronary artery disease, incompletely evaluated. Small pericardial effusion of similar volume to previous imaging. Hepatobiliary: No focal, suspicious  hepatic lesion. No pericholecystic stranding. No biliary duct dilation. Portal vein is patent. Pancreas: Mild atrophy of the pancreas without ductal dilation, inflammation or visible lesion. Spleen: Normal. Adrenals/Urinary Tract: Cortical scarring of the bilateral kidneys without visible lesion or hydronephrosis. Urinary bladder is decompressed with Foley catheter in place. There is substantial perivesical stranding. There is no hydronephrosis. Adrenal glands are normal. Stomach/Bowel: Stomach without signs of adjacent stranding. No small bowel dilation. Colonic diverticulosis. Stranding about the rectum. Perirectal stranding is moderate to marked  along the LEFT rectal wall. There is hypoenhancement of the LEFT rectal wall. Stranding extends to the pelvic floor towards the upper margin of the anal sphincter. Integrity of the distal rectum just above the pelvic floor is difficult to assess. Patulous appearance of the distal rectum abutting the posterior prostate and puborectalis muscle on the LEFT just below the level of greatest stranding. No disseminated gas about the mesorectum. Vascular/Lymphatic: Aortic atherosclerosis. No sign of aneurysm. Smooth contour of the IVC. There is no gastrohepatic or hepatoduodenal ligament lymphadenopathy. No retroperitoneal or mesenteric lymphadenopathy. No pelvic sidewall lymphadenopathy. Reproductive: Prostate unremarkable by CT. Other: No pneumoperitoneum.  No ascites. Musculoskeletal: Levoconvex curvature of the thoracic/lumbar spine. Degenerative changes of the spine. No focal destructive bone lesion. IMPRESSION: 1. Perivesical stranding raising concern for cystitis. Correlate with urinalysis. This is associated with bladder wall thickening as well. 2. Patulous appearance of the distal rectum falling polypectomy versus is contained rectal wall violation/perforation following polypectomy associated with abundant stranding in the mesorectum. There is no disseminated gas or  focal fluid. There is abundant stranding above the site in the pelvis. The somewhat angulated appearance of gas which abuts the posterior prostate and puborectalis muscle raises the question of violation of the rectal wall. Close follow-up is suggested with consideration for water-soluble contrast enema performed other with fluoroscopy or CT. Based on low position of the abnormality just above the sphincter complex would suggest utilization of Foley catheter rather than larger rectal tube if this is performed. 3. Colonic diverticulosis without evidence of acute diverticulitis. 4. Small pericardial effusion of similar volume to previous imaging. 5. Calcified coronary artery disease, incompletely evaluated. 6. Aortic atherosclerosis. Electronically Signed: By: Zetta Bills M.D. On: 01/28/2022 15:39    Scheduled Meds:  carvedilol  12.5 mg Oral BID WC   insulin aspart  0-9 Units Subcutaneous Q6H   LORazepam  0.5 mg Oral QHS   [START ON 02/04/2022] rosuvastatin  5 mg Oral Q Wed   tamsulosin  0.4 mg Oral Daily   Continuous Infusions:  sodium chloride 1,000 mL (01/29/22 0636)   piperacillin-tazobactam (ZOSYN)  IV 3.375 g (01/29/22 0554)     LOS: 1 day   Shelly Coss, MD [facility] P9/28/2023, 8:03 AM

## 2022-01-29 NOTE — Plan of Care (Signed)
  Problem: Education: Goal: Understanding of discharge needs will improve Outcome: Progressing Goal: Verbalization of understanding of the causes of altered bowel function will improve Outcome: Progressing   Problem: Activity: Goal: Ability to tolerate increased activity will improve Outcome: Progressing   Problem: Bowel/Gastric: Goal: Gastrointestinal status for postoperative course will improve Outcome: Progressing   Problem: Health Behavior/Discharge Planning: Goal: Identification of community resources to assist with postoperative recovery needs will improve Outcome: Progressing   Problem: Nutritional: Goal: Will attain and maintain optimal nutritional status will improve Outcome: Progressing   Problem: Clinical Measurements: Goal: Postoperative complications will be avoided or minimized Outcome: Progressing   Problem: Respiratory: Goal: Respiratory status will improve Outcome: Progressing   Problem: Skin Integrity: Goal: Will show signs of wound healing Outcome: Progressing   Problem: Education: Goal: Ability to describe self-care measures that may prevent or decrease complications (Diabetes Survival Skills Education) will improve Outcome: Progressing Goal: Individualized Educational Video(s) Outcome: Progressing   Problem: Coping: Goal: Ability to adjust to condition or change in health will improve Outcome: Progressing   Problem: Fluid Volume: Goal: Ability to maintain a balanced intake and output will improve Outcome: Progressing   Problem: Health Behavior/Discharge Planning: Goal: Ability to identify and utilize available resources and services will improve Outcome: Progressing Goal: Ability to manage health-related needs will improve Outcome: Progressing   Problem: Metabolic: Goal: Ability to maintain appropriate glucose levels will improve Outcome: Progressing   Problem: Nutritional: Goal: Maintenance of adequate nutrition will improve Outcome:  Progressing Goal: Progress toward achieving an optimal weight will improve Outcome: Progressing   Problem: Skin Integrity: Goal: Risk for impaired skin integrity will decrease Outcome: Progressing   Problem: Tissue Perfusion: Goal: Adequacy of tissue perfusion will improve Outcome: Progressing   Problem: Education: Goal: Knowledge of General Education information will improve Description: Including pain rating scale, medication(s)/side effects and non-pharmacologic comfort measures Outcome: Progressing   Problem: Health Behavior/Discharge Planning: Goal: Ability to manage health-related needs will improve Outcome: Progressing   Problem: Clinical Measurements: Goal: Ability to maintain clinical measurements within normal limits will improve Outcome: Progressing Goal: Will remain free from infection Outcome: Progressing Goal: Diagnostic test results will improve Outcome: Progressing Goal: Respiratory complications will improve Outcome: Progressing Goal: Cardiovascular complication will be avoided Outcome: Progressing   Problem: Activity: Goal: Risk for activity intolerance will decrease Outcome: Progressing   Problem: Nutrition: Goal: Adequate nutrition will be maintained Outcome: Progressing   Problem: Coping: Goal: Level of anxiety will decrease Outcome: Progressing   Problem: Elimination: Goal: Will not experience complications related to bowel motility Outcome: Progressing Goal: Will not experience complications related to urinary retention Outcome: Progressing   Problem: Pain Managment: Goal: General experience of comfort will improve Outcome: Progressing   Problem: Safety: Goal: Ability to remain free from injury will improve Outcome: Progressing   Problem: Skin Integrity: Goal: Risk for impaired skin integrity will decrease Outcome: Progressing

## 2022-01-29 NOTE — TOC Initial Note (Signed)
Transition of Care [facility]) - Initial/Assessment Note    Patient Details  Name: Bradley Sheppard MRN: [id number] Date of Birth: 06-19-1940  Transition of Care [facility]) CM/SW Contact:    Iona Beard, [place] Phone Number: 01/29/2022, 1:29 PM  Clinical Narrative:                 Pt is high risk for readmission. CSW spoke with pts wife to complete assessment. Pt has been at [facility] for SNF and has not returned home. Plan was for pt to return home with home health services but he got admitted to [facility]. Pts wife states plan is for pt to be transferred to [facility]. CSW explained that TOC at [facility] will continue to follow for D/C needs. TOC to follow.   Expected Discharge Plan: [place] Barriers to Discharge: Continued Medical Work up   Patient Goals and CMS Choice Patient states their goals for this hospitalization and ongoing recovery are:: get better CMS Medicare.gov Compare Post Acute Care list provided to:: Patient Represenative (must comment) Choice offered to / list presented to : Spouse  Expected Discharge Plan and Services Expected Discharge Plan: [place] In-house Referral: Clinical Social Work Discharge Planning Services: CM Consult   Living arrangements for the past 2 months: [place]                                      Prior Living Arrangements/Services Living arrangements for the past 2 months: Single Family Home Lives with:: Spouse Patient language and need for interpreter reviewed:: Yes        Need for Family Participation in Patient Care: Yes (Comment) Care giver support system in place?: Yes (comment)   Criminal Activity/Legal Involvement Pertinent to Current Situation/Hospitalization: No - Comment as needed  Activities of Daily Living Home Assistive Devices/Equipment: Eyeglasses ADL Screening (condition at time of admission) Patient's cognitive ability adequate to safely complete daily activities?: Yes Is the  patient deaf or have difficulty hearing?: Yes Does the patient have difficulty seeing, even when wearing glasses/contacts?: No Does the patient have difficulty concentrating, remembering, or making decisions?: No Patient able to express need for assistance with ADLs?: Yes Does the patient have difficulty dressing or bathing?: Yes Independently performs ADLs?: No Communication: Independent Dressing (OT): Needs assistance Is this a change from baseline?: Pre-admission baseline Grooming: Needs assistance Is this a change from baseline?: Pre-admission baseline Feeding: Independent Bathing: Needs assistance Is this a change from baseline?: Pre-admission baseline Toileting: Needs assistance Is this a change from baseline?: Pre-admission baseline In/Out Bed: Dependent Is this a change from baseline?: Pre-admission baseline Walks in Home: Dependent Is this a change from baseline?: Pre-admission baseline Does the patient have difficulty walking or climbing stairs?: Yes Weakness of Legs: Both Weakness of Arms/Hands: Both  Permission Sought/Granted                  Emotional Assessment Appearance:: Appears stated age       Alcohol / Substance Use: Not Applicable Psych Involvement: No (comment)  Admission diagnosis:  Hyponatremia [E87.1] Cystitis [N30.90] Patient Active Problem List   Diagnosis Date Noted   Hyponatremia 01/28/2022   Sepsis ([place]) 01/28/2022   Atrial fibrillation ([place]) 01/28/2022   ILD (interstitial lung disease) ([place]) 01/20/2022   Acute exacerbation of  diastolic CHF (congestive heart failure) ([place]) 01/08/2022   Decubitus skin ulcer 01/08/2022  Urinary retention 01/02/2022   Mass in rectum 12/31/2021   Pressure injury of skin 11/29/2021   Chronic kidney disease, stage 3b (HCC) 11/26/2021   Chronic diastolic heart failure (HCC)    SOB (shortness of breath) 11/23/2021   Acute heart failure with preserved ejection fraction (HFpEF) ([place]) 11/23/2021    Class:  Acute   DMII (diabetes mellitus, type 2) ([place]) 11/23/2021   Rectal mass 11/23/2021    Class: Chronic   Bilateral pleural effusion 11/23/2021    Class: Acute   Fecal incontinence alternating with constipation 11/11/2021   MALT lymphoma ([place]) 09/16/2021   Iron deficiency anemia 07/28/2021   Peptic ulcer associated with Helicobacter pylori infection 03/27/2019   Acute on Chronic Anemia superimposed on chronic disease 03/27/2019   Mixed hyperlipidemia 04/03/2009   Essential hypertension, benign 04/03/2009   CORONARY ATHEROSCLEROSIS NATIVE CORONARY ARTERY 04/03/2009   Moderate-Severe Aortic stenosis 02/18/2009   PCP:  [name], MD Pharmacy:   [place], [place] [address] [place] [address] Phone: [phone] Fax: [phone]     Social Determinants of Health (SDOH) Interventions    Readmission Risk Interventions    01/29/2022    1:26 PM 01/10/2022    8:53 AM 01/01/2022    2:42 PM  Readmission Risk Prevention Plan  Transportation Screening Complete Complete Complete  PCP or Specialist Appt within 3-5 Days  Not Complete   HRI or [place]  Complete   Social Work Consult for [place] Planning/Counseling  Complete   Palliative Care Screening  Not Complete   Medication Review Press photographer) Complete Complete   HRI or [place] Complete    SW Recovery Care/Counseling Consult Complete    Palliative Care Screening Not [place] Not Applicable

## 2022-01-30 DIAGNOSIS — R5381 Other malaise: Secondary | ICD-10-CM

## 2022-01-30 DIAGNOSIS — I350 Nonrheumatic aortic (valve) stenosis: Secondary | ICD-10-CM | POA: Diagnosis not present

## 2022-01-30 DIAGNOSIS — A4150 Gram-negative sepsis, unspecified: Secondary | ICD-10-CM

## 2022-01-30 DIAGNOSIS — E1122 Type 2 diabetes mellitus with diabetic chronic kidney disease: Secondary | ICD-10-CM

## 2022-01-30 DIAGNOSIS — I5033 Acute on chronic diastolic (congestive) heart failure: Secondary | ICD-10-CM

## 2022-01-30 DIAGNOSIS — T83511A Infection and inflammatory reaction due to indwelling urethral catheter, initial encounter: Secondary | ICD-10-CM

## 2022-01-30 DIAGNOSIS — K6289 Other specified diseases of anus and rectum: Secondary | ICD-10-CM | POA: Diagnosis not present

## 2022-01-30 DIAGNOSIS — E871 Hypo-osmolality and hyponatremia: Secondary | ICD-10-CM | POA: Diagnosis not present

## 2022-01-30 DIAGNOSIS — N390 Urinary tract infection, site not specified: Secondary | ICD-10-CM

## 2022-01-30 LAB — BASIC METABOLIC PANEL
Anion gap: 10 (ref 5–15)
BUN: 43 mg/dL — ABNORMAL HIGH (ref 8–23)
CO2: 29 mmol/L (ref 22–32)
Calcium: 8.5 mg/dL — ABNORMAL LOW (ref 8.9–10.3)
Chloride: 91 mmol/L — ABNORMAL LOW (ref 98–111)
Creatinine, Ser: 1.75 mg/dL — ABNORMAL HIGH (ref 0.61–1.24)
GFR, Estimated: 38 mL/min — ABNORMAL LOW (ref >60)
Glucose, Bld: 114 mg/dL — ABNORMAL HIGH (ref 70–99)
Potassium: 4 mmol/L (ref 3.5–5.1)
Sodium: 130 mmol/L — ABNORMAL LOW (ref 135–145)

## 2022-01-30 LAB — CBC
HCT: 30.1 % — ABNORMAL LOW (ref 39.0–52.0)
Hemoglobin: 9.9 g/dL — ABNORMAL LOW (ref 13.0–17.0)
MCH: 29.9 pg (ref 26.0–34.0)
MCHC: 32.9 g/dL (ref 30.0–36.0)
MCV: 90.9 fL (ref 80.0–100.0)
Platelets: 422 10*3/uL — ABNORMAL HIGH (ref 150–400)
RBC: 3.31 MIL/uL — ABNORMAL LOW (ref 4.22–5.81)
RDW: 13.9 % (ref 11.5–15.5)
WBC: 10.5 10*3/uL (ref 4.0–10.5)
nRBC: 0 % (ref 0.0–0.2)

## 2022-01-30 LAB — URINE CULTURE: Culture: NO GROWTH

## 2022-01-30 LAB — GLUCOSE, CAPILLARY
Glucose-Capillary: 115 mg/dL — ABNORMAL HIGH (ref 70–99)
Glucose-Capillary: 234 mg/dL — ABNORMAL HIGH (ref 70–99)
Glucose-Capillary: 168 mg/dL — ABNORMAL HIGH (ref 70–99)

## 2022-01-30 MED ORDER — POLYETHYLENE GLYCOL 3350 17 G PO PACK: 17.0000 g | PACK | Freq: Two times a day (BID) | ORAL | Status: DC | PRN

## 2022-01-30 MED ORDER — SODIUM CHLORIDE 0.9 % IV SOLN
1.0000 g | INTRAVENOUS | Status: DC
  Administered 2022-01-30 – 2022-01-31 (×2): 1 g via INTRAVENOUS
  Filled 2022-01-30 (×2): qty 10

## 2022-01-30 NOTE — TOC Progression Note (Addendum)
Transition of Care [facility]) - Progression Note    Patient Details  Name: Bradley Sheppard MRN: [id number] Date of Birth: 03/16/1941  Transition of Care [facility]) CM/SW Contact  Leeroy Cha, RN Phone Number: 01/30/2022, 12:53 PM  Clinical Narrative:    Tct-daughter-wants to use [facility] , [facility] called and referraL DONE. Fl2 done. Referral for [facility] and transport called in the hta at 1352.   Expected Discharge Plan: [place] Barriers to Discharge: Continued Medical Work up  Expected Discharge Plan and Services Expected Discharge Plan: [place] In-house Referral: Clinical Social Work Discharge Planning Services: CM Consult   Living arrangements for the past 2 months: [place] Determinants of Health (SDOH) Interventions    Readmission Risk Interventions   Row Labels 01/29/2022    1:26 PM 01/10/2022    8:53 AM 01/01/2022    2:42 PM  Readmission Risk Prevention Plan   Section Header. No data exists in this row.     Transportation Screening   Complete Complete Complete  PCP or Specialist Appt within 3-5 Days    Not Complete   HRI or [place]    Complete   Social Work Consult for [place] Planning/Counseling    Complete   Palliative Care Screening    Not Complete   Medication Review Press photographer)   Complete Complete   HRI or [place]   Complete    SW Recovery Care/Counseling Consult   Complete    Palliative Care Screening   Not [place]   Not Applicable

## 2022-01-30 NOTE — Consult Note (Signed)
WOC Nurse Consult Note: Reason for Consult:Stage 2 pressure injury to the sacrum Wound type:pressure. Consult performed remotely following review of the medical record. Pressure Injury POA: Yes Measurement:Per Nursing Flow Sheet, 1.5cm x 1cm x 0.1cm Wound bed:red, with yellow fibrinous slough in wound base Drainage (amount, consistency, odor) scant serous Periwound:intact Dressing procedure/placement/frequency:I have provided Nursing with guidance in the care of this lesion using a NS cleanse and covering wth a silicone bordered foam dressing. Turning and repositioning off of the area will be an essential element of the POC. I have additionally provided bilateral pressure redistribution heel boots for PI prevention in that area.  [place] nursing team will not follow, but will remain available to this patient, the nursing and medical teams.  Please re-consult if needed.  Thank you for inviting [place] to participate in this patient's Plan of Care.  Maudie Flakes, MSN, RN, CNS, [place], Serita Grammes, [employer], [employer] phone:  [phone]

## 2022-01-30 NOTE — Evaluation (Signed)
Occupational Therapy Evaluation Patient Details Name: ASAR EVILSIZER MRN: [id number] DOB: 1940-09-25 Today's Date: 01/30/2022   History of Present Illness Pt is an 81 y.o. 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 He was admitted to the hospital from a SNF where he was receiving rehab. He currently requires increased assistance for self-care tasks, such as toileting and lower body dressing, as well as for functional transfers. He will benefit from further OT services to maximize his safety and independence with ADLs & to decrease the risk for restricted participation in meaningful activities.       Recommendations for follow up therapy are one component of a multi-disciplinary discharge planning process, led by the attending physician.  Recommendations may be updated based on patient status, additional functional criteria and insurance authorization.   Follow Up Recommendations  Skilled nursing-short term rehab (<3 hours/day)    Assistance Recommended at Discharge Frequent or constant Supervision/Assistance  Patient can return home with the following A lot of help with bathing/dressing/bathroom;A lot of help with walking and/or transfers;Help with stairs or ramp for entrance    Functional Status Assessment  Patient has had a recent decline in their functional status and demonstrates the ability to make significant improvements in function in a reasonable and predictable amount of time.  Equipment Recommendations  None recommended by OT       Precautions /  Restrictions Precautions Precautions: Fall          ADL either performed or assessed with clinical judgement   ADL   Eating/Feeding: Set up Eating/Feeding Details (indicate cue type and reason): based on clinical judgement Grooming: Set up Grooming Details (indicate cue type and reason): simulated at bed level         Upper Body Dressing : Minimal assistance   Lower Body Dressing: Maximal assistance             Vision   Additional Comments: he correctly read the time depicted on the wall clock            Pertinent Vitals/Pain Pain Assessment Pain Assessment: No/denies pain     Hand Dominance Right   Extremity/Trunk Assessment Upper Extremity Assessment Upper Extremity Assessment:  (B UE AROM WFL; B UE grip strength 4-/5)   Lower Extremity Assessment Lower Extremity Assessment:  (R LE AROM WFL) LLE Deficits / Details: hx multiple left knee surgeries and pt unable to perform full active flexion       Communication Communication Communication: HOH   Cognition Arousal/Alertness: Awake/alert Behavior During Therapy: WFL for tasks assessed/performed Overall Cognitive Status: Within Functional Limits for tasks assessed Able to follow commands without difficulty, pleasant                    Home Living Family/patient expects to be discharged to:: Skilled nursing facility Living Arrangements: Spouse/significant other   Type of Home: House Home Access: Stairs to enter [employer] of Steps: 1   Home Layout: One level     Bathroom Shower/Tub: Tub/shower unit         Home Equipment: Conservation officer, nature (2 wheels);Rollator (4 wheels);BSC/3in1;Tub bench;Transport chair  Prior Functioning/Environment Prior Level of Function : Needs assist             Mobility Comments: had just started ambulating with RW with PT at SNF prior to admission; prior to July of this year, he could ambulate short household distances using a RW ADLs  Comments: Assisted by his spouse for bathing and dressing at home; his spouse managed the household chores        OT Problem List: Decreased strength;Decreased range of motion;Decreased activity tolerance;Impaired balance (sitting and/or standing)      OT Treatment/Interventions: Self-care/ADL training;Therapeutic exercise;Neuromuscular education;Energy conservation;DME and/or AE instruction;Balance training;Patient/family education;Therapeutic activities    OT Goals(Current goals can be found in the care plan section) Acute Rehab OT Goals Patient Stated Goal: to get better and return home OT Goal Formulation: With patient/family Time For Goal Achievement: 02/13/22 Potential to Achieve Goals: Fair  OT Frequency: Min 2X/week       AM-PAC OT "6 Clicks" Daily Activity     Outcome Measure Help from another person eating meals?: None Help from another person taking care of personal grooming?: A Little Help from another person toileting, which includes using toliet, bedpan, or urinal?: A Lot Help from another person bathing (including washing, rinsing, drying)?: A Lot Help from another person to put on and taking off regular upper body clothing?: A Little Help from another person to put on and taking off regular lower body clothing?: A Lot 6 Click Score: 16   End of Session Nurse Communication: Mobility status  Activity Tolerance: Patient tolerated treatment well Patient left: in bed;with call bell/phone within reach;with bed alarm set;with family/visitor present  OT Visit Diagnosis: Unsteadiness on feet (R26.81);Muscle weakness (generalized) (M62.81)                Time: 8469-6295 OT Time Calculation (min): 35 min Charges:  OT Evaluation $OT Eval Low Complexity: 1 Low $OT Eval Moderate Complexity: 1 Mod OT Treatments $Therapeutic Activity: 8-22 mins    Leota Sauers, OTR/L 01/30/2022, 4:29 PM

## 2022-01-30 NOTE — Evaluation (Signed)
Physical Therapy Evaluation Patient Details Name: Bradley Sheppard MRN: [id number] DOB: [birth year] Today's Date: 01/30/2022  History of Present Illness  Pt is an 81 y.o. 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  Pt currently with functional limitations due to the deficits listed below (see PT Problem List). Pt will benefit from skilled PT to increase their independence and safety with mobility to allow discharge to the venue listed below.   Pt agreeable to mobilize.  Pt assisted to standing and was able to take a few steps up [place].  Pt very fearful of falling and fatigued quickly.  Pt assisted back to supine and repositioning with pillows.  Recommend pt return to SNF to complete rehab prior to return home.        Recommendations for follow up therapy are one component of a multi-disciplinary discharge planning process, led by the attending physician.  Recommendations may be updated based on patient status, additional functional criteria and insurance authorization.  Follow Up Recommendations Skilled nursing-short term rehab (<3 hours/day) Can patient physically be transported by private vehicle: No    Assistance Recommended at Discharge Frequent or constant Supervision/Assistance  Patient can return home with the following  Assistance with cooking/housework;Assist for transportation;Help with stairs or ramp for entrance;A lot of help with walking and/or transfers;A lot of help with bathing/dressing/bathroom    Equipment Recommendations None recommended by PT  Recommendations for Other Services       Functional Status Assessment Patient has had a recent decline in their functional status and demonstrates the  ability to make significant improvements in function in a reasonable and predictable amount of time.     Precautions / Restrictions Precautions Precautions: Fall      Mobility  Bed Mobility Overal bed mobility: Needs Assistance Bed Mobility: Supine to Sit, Sit to Supine     Supine to sit: Max assist Sit to supine: Mod assist   General bed mobility comments: assist for LEs over EOB, trunk upright, scooting to EOB; assist for LEs onto bed upon returning to supine    Transfers Overall transfer level: Needs assistance Equipment used: Rolling walker (2 wheels) Transfers: Sit to/from Stand Sit to Stand: Mod assist, From elevated surface           General transfer comment: cues for technique, pt with limited left knee flexion, assist to rise and steady, able to take side steps up [facility] with min assist    Ambulation/Gait                  Stairs            Wheelchair Mobility    Modified Rankin (Stroke Patients Only)       Balance Overall balance assessment: Needs assistance Sitting-balance support: No upper extremity supported, Feet supported Sitting balance-Leahy Scale: Fair     Standing balance support: During functional activity, Reliant on assistive device for balance, Bilateral upper extremity supported Standing balance-Leahy Scale: Poor                               Pertinent Vitals/Pain Pain Assessment Pain Assessment: Faces Faces Pain Scale: Hurts a little bit Pain Location: periarea Pain Descriptors / Indicators: Sore Pain  Intervention(s): Repositioned, Monitored during session (heels floated and placed pillows for offloading mid sacrum)    Home Living Family/patient expects to be discharged to:: Skilled nursing facility Living Arrangements: Spouse/significant other Available Help at Discharge: Available 24 hours/day;Family Type of Home: House Home Access: Stairs to enter   [employer] of Steps: 1   Home  Layout: One level Home Equipment: Conservation officer, nature (2 wheels);Rollator (4 wheels);BSC/3in1;Tub bench;Grab bars - toilet;Transport chair Additional Comments: recent admission and discharged to SNF, admitted from SNF    Prior Function Prior Level of Function : Needs assist;History of Falls (last six months)             Mobility Comments: had just started ambulating with RW with PT at SNF prior to admission       Hand Dominance   Dominant Hand: Right    Extremity/Trunk Assessment   Upper Extremity Assessment Upper Extremity Assessment: Generalized weakness    Lower Extremity Assessment Lower Extremity Assessment: Generalized weakness;LLE deficits/detail LLE Deficits / Details: hx multiple left knee surgeries and pt unable to perform full active flexion - limited to approx 35*    Cervical / Trunk Assessment Cervical / Trunk Assessment: Kyphotic  Communication   Communication: HOH  Cognition Arousal/Alertness: Awake/alert Behavior During Therapy: WFL for tasks assessed/performed Overall Cognitive Status: Within Functional Limits for tasks assessed                                          General Comments      Exercises     Assessment/Plan    PT Assessment Patient needs continued PT services  PT Problem List Decreased strength;Decreased range of motion;Decreased activity tolerance;Decreased balance;Decreased mobility;Decreased knowledge of use of DME;Decreased safety awareness       PT Treatment Interventions DME instruction;Gait training;Functional mobility training;Therapeutic activities;Therapeutic exercise;Patient/family education;Balance training    PT Goals (Current goals can be found in the Care Plan section)  Acute Rehab PT Goals PT Goal Formulation: With patient Time For Goal Achievement: 02/13/22 Potential to Achieve Goals: Good    Frequency Min 2X/week     Co-evaluation               AM-PAC PT "6 Clicks" Mobility  Outcome  Measure Help needed turning from your back to your side while in a flat bed without using bedrails?: A Lot Help needed moving from lying on your back to sitting on the side of a flat bed without using bedrails?: A Lot Help needed moving to and from a bed to a chair (including a wheelchair)?: A Lot Help needed standing up from a chair using your arms (e.g., wheelchair or bedside chair)?: Total Help needed to walk in hospital room?: Total Help needed climbing 3-5 steps with a railing? : Total 6 Click Score: 9    End of Session Equipment Utilized During Treatment: Gait belt Activity Tolerance: Patient limited by fatigue Patient left: in bed;with call bell/phone within reach;with bed alarm set;with family/visitor present Nurse Communication: Mobility status PT Visit Diagnosis: Unsteadiness on feet (R26.81);Muscle weakness (generalized) (M62.81);Difficulty in walking, not elsewhere classified (R26.2)    Time: 2094-7096 PT Time Calculation (min) (ACUTE ONLY): 15 min   Charges:   PT Evaluation $PT Eval Low Complexity: 1 Low        Bradley PT, DPT Physical Therapist Acute Rehabilitation Services Preferred contact method: Secure Chat Weekend Pager Only: [phone] Office: [phone]  Bradley Sheppard 01/30/2022, 11:56 AM

## 2022-01-30 NOTE — Progress Notes (Addendum)
PROGRESS NOTE  Bradley Sheppard KGM:[id number] DOB: 06-Aug-1940   PCP: [name], MD  Patient is from: SNF  DOA: 01/28/2022 LOS: 2  Chief complaints Chief Complaint  Patient presents with   Abnormal Lab     Brief Narrative / Interim history: 81 year old M with PMH of UYQ-0H, diastolic CHF, HTN, DM-2, rectal mass status post partial proctectomy by transanal endoscopic microsurgery on 12/31/21, urinary retention with chronic Foley and Hodgkin's lymphoma sent to [facility], ED by his oncologist after he presented there with general weakness, malaise, abdominal pain and burning sensation in the rectal area.  He was hyponatremic to 121.  Leukocytosis to 15.3.  CT abdomen and pelvis showed perivesical stranding raising concern for cystitis and possible rectal perforation.  Patient was started on IV Zosyn.  General surgery consulted and recommended transfer to [facility] for further evaluation and care.  After evaluation, surgical team did not feel there is need for surgical intervention or antibiotic, and recommended sitz bath's, scheduled Tylenol with as needed oxycodone and bowel regimen to avoid constipation.   In regards to hyponatremia, felt to be dilutional in the setting of volume.  Started on IV Lasix with improvement.  Urine culture grew E. coli.  Antibiotic de-escalated to IV ceftriaxone.  Culture sensitivity pending.  It seems Foley catheter was changed at [facility], ED.  May need voiding trial outpatient.    Subjective: Seen and examined earlier this morning.  No major events overnight or this morning.  No complaints other than slight burning sensation in the rectal area.  Denies chest pain, dyspnea, nausea, vomiting or abdominal pain.  Patient's wife at bedside.  Objective: Vitals:   01/29/22 1853 01/29/22 2241 01/30/22 0257 01/30/22 0604  BP: (!) 123/59 120/65 132/76 115/66  Pulse: 69 68 86 77  Resp: '20 12 12 14  '$ Temp: 98.6 F (37 C) (!) 97.5 F (36.4 C) 97.7  F (36.5 C) (!) 97.5 F (36.4 C)  TempSrc:  Oral Oral Oral  SpO2: 100% 100% 99% 99%  Weight:      Height:        Examination:  GENERAL: No apparent distress.  Nontoxic. HEENT: MMM.  Vision and hearing grossly intact.  NECK: Supple.  No apparent JVD.  RESP:  No IWOB.  Fair aeration bilaterally. CVS:  RRR. Heart sounds normal.  ABD/GI/GU: BS+. Abd soft, NTND.  Foley catheter in place. MSK/EXT:  Moves extremities. No apparent deformity.  Trace BLE edema. SKIN: Stage III coccygeal pressure ulcer NEURO: Awake, alert and oriented fairly.  No apparent focal neuro deficit. PSYCH: Calm. Normal affect.   Procedures:  None  Microbiology summarized: Blood cultures NGTD Urine culture with 80,000 colonies of E. coli.  Assessment and plan: Principal Problem:   Hyponatremia Active Problems:   Moderate-Severe Aortic stenosis   DMII (diabetes mellitus, type 2) (HCC)   Rectal mass   Essential hypertension, benign   MALT lymphoma (HCC)   Acute on chronic diastolic CHF (congestive heart failure) (HCC)   Chronic kidney disease, stage 3b (HCC)   Urinary retention   Sepsis due to gram-negative UTI [facility])   Atrial fibrillation (HCC)   Catheter-associated urinary tract infection (HCC)   Physical deconditioning  Hypervolemic hyponatremia: Sodium improved from 121-130 with IV Lasix.   -Continue IV Lasix -Recommend discontinuing Actos on discharge due to risk for fluid retention   Rectal mass s/p resection with transanal endoscopic microsurgery on 12/31/2021: CT abdomen and pelvis raised concern for microperforation with felt to be  less likely after evaluation by general surgery. -Surgery recommended  sitz bath's, scheduled Tylenol with as needed oxycodone and bowel regimen -De-escalated antibiotics to IV ceftriaxone for possible catheter associated UTI   Sepsis due to catheter associated UTI: POA.  Had leukocytosis and tachycardia on presentation.  Blood cultures NGTD.  Urine culture with E.  coli.  Catheter exchanged in ED. sepsis physiology resolving. -Continue IV ceftriaxone pending culture sensitivity   Acute on chronic diastolic CHF: TTE in the 12/5883 with LVEF of 65 to 70%, G2 DD, severe LAE, moderate RAE, moderate aortic stenosis and normal RVSP.  Was somewhat hypervolemic with hyponatremia on presentation.  BNP elevated to 527.  Started on IV Lasix.  Net -1 L.   -Continue IV Lasix 40 mg twice daily -Continue holding home metolazone -Recommend discontinuing Actos on discharge -Strict intake and output, daily weight, renal functions and electrolytes   Essential hypertension: Normotensive. -Continue home Coreg -Lasix as above   Uncontrolled NIDDM-2 with hyperglycemia: A1c 7.2% on 12/31/2021. Recent Labs  Lab 01/29/22 1139 01/29/22 1956 01/29/22 2341 01/30/22 0522 01/30/22 1159  GLUCAP 136* 213* 117* 115* 234*  -Continue current insulin regimen and adjust as appropriate -Recommend discontinuing Actos on discharge due to risk for fluid retention -Continue home Crestor  Paroxysmal A-fib: Not on anticoagulation due to history of frequent falls.  On aspirin only.CHADs2VASC score- at least 4.  -Continue home Coreg and low-dose aspirin   CKD stage IIIb: Stable. Recent Labs    01/01/22 0458 01/02/22 0504 01/08/22 1105 01/09/22 0317 01/10/22 0412 01/11/22 0507 01/13/22 0817 01/28/22 1008 01/28/22 1337 01/29/22 0459 01/30/22 0555  BUN 36*  --  47* 43* 43* 44* 55* 52* 52* 46* 43*  CREATININE 1.34* 1.91* 1.79* 1.59* 1.66* 1.69* 1.91* 1.86* 1.91* 1.71* 1.75*  -Continue monitoring  History of CAD:  Status post DES to LAD and RCA in 2003.  On aspirin, Coreg, Crestor at home. -Continue home medications   History of Hodgkin's lymphoma:  -Outpatient follow-up with oncology   Normocytic anemia: H&H stable. Recent Labs    01/08/22 1105 01/09/22 0317 01/10/22 0412 01/11/22 0507 01/12/22 0448 01/13/22 0817 01/28/22 1008 01/28/22 1337 01/29/22 0459  01/30/22 0555  HGB 8.1* 8.9* 8.6* 8.5* 9.1* 8.9* 9.3* 9.6* 9.2* 9.9*  -IV ferric gluconate -Monitor  Urinary retention with indwelling Foley catheter since rectal mass resection on 12/31/2021. -Foley catheter exchanged in ED -Continue home Flomax -Outpatient follow-up with urology for voiding trial  Physical deconditioning -PT/OT eval  Body mass index is 25.63 kg/m.   Pressure skin injury: POA Pressure Injury 01/29/22 Coccyx Stage 3 -  Full thickness tissue loss. Subcutaneous fat may be visible but bone, tendon or muscle are NOT exposed. (Active)  01/29/22   Location: Coccyx  Location Orientation:   Staging: Stage 3 -  Full thickness tissue loss. Subcutaneous fat may be visible but bone, tendon or muscle are NOT exposed.  Wound Description (Comments):   Present on Admission: Yes  Dressing Type Foam - Lift dressing to assess site every shift 01/29/22 1951   DVT prophylaxis:  SCDs Start: 01/28/22 2252  Code Status: Full code Family Communication: Updated patient's wife at bedside Level of care: Telemetry Status is: Inpatient Remains inpatient appropriate because: Sepsis in the setting of catheter associated UTI, hyponatremia none acute on chronic diastolic CHF   Final disposition: TBD Consultants:  General surgery  Sch Meds:  Scheduled Meds:  carvedilol  12.5 mg Oral BID WC   Chlorhexidine Gluconate Cloth  6 each Topical Q0600  furosemide  40 mg Intravenous BID   insulin aspart  0-9 Units Subcutaneous Q6H   lip balm   Topical BID   liver oil-zinc oxide   Topical BID   LORazepam  0.5 mg Oral QHS   polycarbophil  625 mg Oral BID   [START ON 02/04/2022] rosuvastatin  5 mg Oral Q Wed   tamsulosin  0.4 mg Oral Daily   Continuous Infusions:  cefTRIAXone (ROCEPHIN)  IV     ferric gluconate (FERRLECIT) IVPB 250 mg (01/30/22 1144)   PRN Meds:.acetaminophen **OR** [DISCONTINUED] acetaminophen, alum & mag hydroxide-simeth, magic mouthwash, menthol-cetylpyridinium,  morphine injection, ondansetron **OR** ondansetron (ZOFRAN) IV, mouth rinse, oxyCODONE, phenol, polyethylene glycol, simethicone, witch hazel-glycerin  Antimicrobials: Anti-infectives (From admission, onward)    Start     Dose/Rate Route Frequency Ordered Stop   01/30/22 1200  cefTRIAXone (ROCEPHIN) 1 g in sodium chloride 0.9 % 100 mL IVPB        1 g 200 mL/hr over 30 Minutes Intravenous Every 24 hours 01/30/22 1110 02/02/22 1159   01/28/22 2300  piperacillin-tazobactam (ZOSYN) IVPB 3.375 g  Status:  Discontinued        3.375 g 12.5 mL/hr over 240 Minutes Intravenous Every 8 hours 01/28/22 2247 01/30/22 1110   01/28/22 2200  piperacillin-tazobactam (ZOSYN) IVPB 3.375 g  Status:  Discontinued        3.375 g 12.5 mL/hr over 240 Minutes Intravenous Every 8 hours 01/28/22 1603 01/28/22 2323   01/28/22 1800  piperacillin-tazobactam (ZOSYN) IVPB 3.375 g  Status:  Discontinued        3.375 g 12.5 mL/hr over 240 Minutes Intravenous Every 6 hours 01/28/22 1558 01/28/22 1600   01/28/22 1615  piperacillin-tazobactam (ZOSYN) IVPB 2.25 g  Status:  Discontinued        2.25 g 100 mL/hr over 30 Minutes Intravenous Every 8 hours 01/28/22 1600 01/28/22 1603   01/28/22 1615  piperacillin-tazobactam (ZOSYN) IVPB 3.375 g        3.375 g 100 mL/hr over 30 Minutes Intravenous  Once 01/28/22 1603 01/28/22 1712        I have personally reviewed the following labs and images: CBC: Recent Labs  Lab 01/28/22 1008 01/28/22 1337 01/29/22 0459 01/30/22 0555  WBC 14.8* 15.3* 13.5* 10.5  NEUTROABS 10.9* 11.2*  --   --   HGB 9.3* 9.6* 9.2* 9.9*  HCT 26.9* 28.1* 27.1* 30.1*  MCV 89.1 89.2 89.1 90.9  PLT 387 392 387 422*   BMP &GFR Recent Labs  Lab 01/28/22 1008 01/28/22 1337 01/29/22 0459 01/30/22 0555  NA 121* 122* 125* 130*  K 4.5 4.6 4.2 4.0  CL 87* 86* 93* 91*  CO2 '23 24 26 29  '$ GLUCOSE 240* 169* 80 114*  BUN 52* 52* 46* 43*  CREATININE 1.86* 1.91* 1.71* 1.75*  CALCIUM 7.9* 8.2* 8.0* 8.5*   MG  --  1.9  --   --    Estimated Creatinine Clearance: 35.1 mL/min (A) (by C-G formula based on SCr of 1.75 mg/dL (H)). Liver & Pancreas: Recent Labs  Lab 01/28/22 1008 01/28/22 1337  AST 21 22  ALT 17 16  ALKPHOS 81 81  BILITOT 0.7 0.8  PROT 6.3* 6.6  ALBUMIN 2.4* 2.4*   No results for input(s): "LIPASE", "AMYLASE" in the last 168 hours. No results for input(s): "AMMONIA" in the last 168 hours. Diabetic: No results for input(s): "HGBA1C" in the last 72 hours. Recent Labs  Lab 01/29/22 1139 01/29/22 1956 01/29/22 2341 01/30/22 0522 01/30/22 1159  GLUCAP 136* 213* 117* 115* 234*   Cardiac Enzymes: No results for input(s): "CKTOTAL", "CKMB", "CKMBINDEX", "TROPONINI" in the last 168 hours. No results for input(s): "PROBNP" in the last 8760 hours. Coagulation Profile: No results for input(s): "INR", "PROTIME" in the last 168 hours. Thyroid Function Tests: No results for input(s): "TSH", "T4TOTAL", "FREET4", "T3FREE", "THYROIDAB" in the last 72 hours. Lipid Profile: No results for input(s): "CHOL", "HDL", "LDLCALC", "TRIG", "CHOLHDL", "LDLDIRECT" in the last 72 hours. Anemia Panel: Recent Labs    01/28/22 1015  FERRITIN 373*  TIBC 199*  IRON 25*   Urine analysis:    Component Value Date/Time   COLORURINE YELLOW 01/28/2022 1455   APPEARANCEUR HAZY (A) 01/28/2022 1455   LABSPEC 1.010 01/28/2022 1455   PHURINE 5.0 01/28/2022 1455   GLUCOSEU NEGATIVE 01/28/2022 1455   HGBUR MODERATE (A) 01/28/2022 1455   BILIRUBINUR NEGATIVE 01/28/2022 1455   KETONESUR NEGATIVE 01/28/2022 1455   PROTEINUR NEGATIVE 01/28/2022 1455   UROBILINOGEN 0.2 12/26/2008 0950   NITRITE NEGATIVE 01/28/2022 1455   LEUKOCYTESUR LARGE (A) 01/28/2022 1455   Sepsis Labs: Invalid input(s): "PROCALCITONIN", "LACTICIDVEN"  Microbiology: Recent Results (from the past 240 hour(s))  Remove and replace urinary cath (placed > 5 days) then obtain urine culture from new indwelling urinary catheter.      Status: Abnormal (Preliminary result)   Collection Time: 01/28/22 10:05 PM   Specimen: Urine, Catheterized  Result Value Ref Range Status   Specimen Description   Final    URINE, CATHETERIZED Performed at [facility], [address], [place], [place] [address]    Special Requests   Final    NONE Performed at [facility], [address], [place], [place] [address]    Culture (A)  Final    80,000 COLONIES/mL ESCHERICHIA COLI SUSCEPTIBILITIES TO FOLLOW Performed at [facility], [place] [address] [place], [place] [address]    Report Status PENDING  Incomplete  Culture, blood (Routine X 2) w Reflex to ID Panel     Status: None (Preliminary result)   Collection Time: 01/28/22 10:37 PM   Specimen: Left Antecubital; Blood  Result Value Ref Range Status   Specimen Description LEFT ANTECUBITAL  Final   Special Requests   Final    BOTTLES DRAWN AEROBIC AND ANAEROBIC Blood Culture adequate volume   Culture   Final    NO GROWTH 2 DAYS Performed at [facility], [address], [place], [place] [address]    Report Status PENDING  Incomplete  Culture, blood (Routine X 2) w Reflex to ID Panel     Status: None (Preliminary result)   Collection Time: 01/28/22 10:37 PM   Specimen: BLOOD RIGHT HAND  Result Value Ref Range Status   Specimen Description BLOOD RIGHT HAND  Final   Special Requests   Final    BOTTLES DRAWN AEROBIC AND ANAEROBIC Blood Culture adequate volume   Culture   Final    NO GROWTH 2 DAYS Performed at [facility], [address], [place], [place] [address]    Report Status PENDING  Incomplete    Radiology Studies: No results found.    Jarryn Altland T. [place]  If 7PM-7AM, please contact night-coverage [email or web address] 01/30/2022, 12:15 PM

## 2022-01-30 NOTE — NC FL2 (Signed)
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 and Address:  [facility],  [place], [place]      Provider Number: [id number]  Attending Physician Name and Address:  Mercy Riding, MD  Relative Name and Phone Number:       Current Level of Care: Hospital Recommended Level of Care: [place] Prior Approval Number:    Date Approved/Denied:   PASRR Number: [id number]  Discharge Plan: SNF    Current Diagnoses: Patient Active Problem List   Diagnosis Date Noted   Catheter-associated urinary tract infection ([place]) 01/30/2022   Physical deconditioning 01/30/2022   Hyponatremia 01/28/2022   Sepsis due to gram-negative UTI ([place]) 01/28/2022   Atrial fibrillation ([place]) 01/28/2022   ILD (interstitial lung disease) ([place]) 01/20/2022   Acute exacerbation of  diastolic CHF (congestive heart failure) ([place]) 01/08/2022   Decubitus skin ulcer 01/08/2022   Urinary retention 01/02/2022   Mass in rectum 12/31/2021   Pressure injury of skin 11/29/2021   Chronic kidney disease, stage 3b (HCC) 11/26/2021   Acute on chronic diastolic CHF (congestive heart failure) (HCC)    SOB (shortness of breath) 11/23/2021   Acute heart failure with preserved ejection fraction (HFpEF) ([place]) 11/23/2021   DMII (diabetes mellitus, type 2) ([place]) 11/23/2021   Rectal mass 11/23/2021   Bilateral pleural effusion 11/23/2021   Fecal incontinence alternating with constipation 11/11/2021   MALT lymphoma ([place]) 09/16/2021   Iron deficiency anemia 07/28/2021   Peptic ulcer associated with Helicobacter pylori infection 03/27/2019   Acute on Chronic Anemia superimposed on chronic disease 03/27/2019   Mixed hyperlipidemia 04/03/2009   Essential hypertension, benign 04/03/2009   CORONARY  ATHEROSCLEROSIS NATIVE CORONARY ARTERY 04/03/2009   Moderate-Severe Aortic stenosis 02/18/2009    Orientation RESPIRATION BLADDER Height & Weight     Self, Time, Situation, Place  Normal Continent Weight: 85.7 kg Height:  6' (182.9 cm)  BEHAVIORAL SYMPTOMS/MOOD NEUROLOGICAL BOWEL NUTRITION STATUS      Continent Diet (regular)  AMBULATORY STATUS COMMUNICATION OF NEEDS Skin   Extensive Assist Verbally PU Stage and Appropriate Care   PU Stage 2 Dressing: Daily                   Personal Care Assistance Level of Assistance  Bathing, Feeding, Dressing Bathing Assistance: Maximum assistance Feeding assistance: Limited assistance Dressing Assistance: Maximum assistance     Functional Limitations Info  Sight, Hearing, Speech Sight Info: Impaired Hearing Info: Adequate Speech Info: Adequate    SPECIAL CARE FACTORS FREQUENCY  PT (By licensed PT), OT (By licensed OT)     PT Frequency: 5 x weekly OT Frequency: 5 x weekly            Contractures Contractures Info: Not present    Additional Factors Info  Code Status Code Status Info: full Allergies Info: prednisone           Current Medications (01/30/2022):  This is the current hospital active medication list Current Facility-Administered Medications  Medication Dose Route Frequency Provider Last Rate Last Admin   acetaminophen (TYLENOL) tablet 650 mg  650 mg Oral Q6H PRN Emokpae, Ejiroghene E, MD   650 mg at 01/29/22 0840   alum & mag hydroxide-simeth (MAALOX/MYLANTA) 200-200-20 MG/5ML suspension 30 mL  30 mL Oral Q6H PRN Michael Boston, MD  carvedilol (COREG) tablet 12.5 mg  12.5 mg Oral BID WC Emokpae, Ejiroghene E, MD   12.5 mg at 01/30/22 0808   cefTRIAXone (ROCEPHIN) 1 g in sodium chloride 0.9 % 100 mL IVPB  1 g Intravenous Q24H Gonfa, Taye T, MD       Chlorhexidine Gluconate Cloth 2 % PADS 6 each  6 each Topical Q0600 Shelly Coss, MD   6 each at 01/30/22 0523   ferric gluconate (FERRLECIT) 250 mg in  sodium chloride 0.9 % 250 mL IVPB  250 mg Intravenous Daily Shelly Coss, MD 135 mL/hr at 01/30/22 1144 250 mg at 01/30/22 1144   furosemide (LASIX) injection 40 mg  40 mg Intravenous BID Shelly Coss, MD   40 mg at 01/30/22 1829   insulin aspart (novoLOG) injection 0-9 Units  0-9 Units Subcutaneous Q6H Emokpae, Ejiroghene E, MD   3 Units at 01/30/22 1215   lip balm (CARMEX) ointment   Topical BID Michael Boston, MD   75 Application at 93/71/69 [phone]   liver oil-zinc oxide (DESITIN) 40 % ointment   Topical BID Michael Boston, MD   Given at 01/30/22 3810   LORazepam (ATIVAN) tablet 0.5 mg  0.5 mg Oral QHS Emokpae, Ejiroghene E, MD   0.5 mg at 01/29/22 2208   magic mouthwash  15 mL Oral QID PRN Michael Boston, MD       menthol-cetylpyridinium (CEPACOL) lozenge 3 mg  1 lozenge Oral PRN Michael Boston, MD       morphine (PF) 2 MG/ML injection 2 mg  2 mg Intravenous Q4H PRN Emokpae, Ejiroghene E, MD   2 mg at 01/29/22 0938   ondansetron (ZOFRAN) tablet 4 mg  4 mg Oral Q6H PRN Emokpae, Ejiroghene E, MD       Or   ondansetron (ZOFRAN) injection 4 mg  4 mg Intravenous Q6H PRN Emokpae, Ejiroghene E, MD       Oral care mouth rinse  15 mL Mouth Rinse PRN Adhikari, Amrit, MD       oxyCODONE (Oxy IR/ROXICODONE) immediate release tablet 2.5-5 mg  2.5-5 mg Oral Q6H PRN Shelly Coss, MD   5 mg at 01/30/22 1140   phenol (CHLORASEPTIC) mouth spray 2 spray  2 spray Mouth/Throat PRN Michael Boston, MD       polycarbophil (FIBERCON) tablet 625 mg  625 mg Oral BID Michael Boston, MD   625 mg at 01/30/22 0928   polyethylene glycol (MIRALAX / GLYCOLAX) packet 17 g  17 g Oral BID PRN Mercy Riding, MD       [START ON 02/04/2022] rosuvastatin (CRESTOR) tablet 5 mg  5 mg Oral Q Wed Emokpae, Ejiroghene E, MD       simethicone (MYLICON) 40 FB/5.1WC suspension 80 mg  80 mg Oral QID PRN Michael Boston, MD       tamsulosin (FLOMAX) capsule 0.4 mg  0.4 mg Oral Daily Emokpae, Ejiroghene E, MD   0.4 mg at 01/30/22 5852   witch  hazel-glycerin (TUCKS) pad   Topical PRN Michael Boston, MD         Discharge Medications: Please see discharge summary for a list of discharge medications.  Relevant Imaging Results:  Relevant Lab Results:   Additional Information SS# [id number]  Leeroy Cha, RN

## 2022-01-30 NOTE — Progress Notes (Signed)
Bradley Sheppard [id number] 01-30-1941  CARE TEAM:  PCP: [name], MD  Outpatient Care Team: Patient Care Team: [name], MD as PCP - General (Internal Medicine) Satira Sark, MD as PCP - Cardiology (Cardiology) Derek Jack, MD as Medical Oncologist (Hematology) Gala Romney Cristopher Estimable, MD as Consulting Physician (Gastroenterology) Michael Boston, MD as Consulting Physician (General Surgery)  Inpatient Treatment Team: Treatment Team: Attending Provider: Mercy Riding, MD; Technician: Christophe Louis Rubye Oaks, RN; Rounding Team: Fatima Blank, MD; Rounding Team: Nolon Nations, MD; Consulting Physician: Michael Boston, MD; Utilization Review: Beau Fanny, RN; Occupational Therapist: Leota Sauers, OT; Pharmacist: Eudelia Bunch, [facility]; Technician: Shary Decamp, NT; Physical Therapist: Carley Hammed, PT; Licensed Practical Nurse: Mady Haagensen, LPN; Case Manager: Frann Rider, RN; Attending Physician: Ardis Hughs, MD   Problem List:   Principal Problem:   Hyponatremia Active Problems:   Essential hypertension, benign   Moderate-Severe Aortic stenosis   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   The resulting mass was 6x5 cm in size   Pin placement on pathology specimen: Proximal margin: Pink Distal margin:Green Right lateral: Red Left lateral:  Yellow   The closure rests 0-2cm from the anal verge. It is 55% of the circumference (going from right anterior to  left posterior midline)  ##################  SURGICAL PATHOLOGY  CASE: [id number]  PATIENT: Bradley Sheppard  Surgical Pathology Report      Clinical History: large bleeding rectal polyp      FINAL MICROSCOPIC DIAGNOSIS:   A. 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  The resection is 6 cm  from proximal to distal, 5.8 cm from left to right, up to 0.8 cm from  mucosal surface to deep margin.  There is a central 5.6 x 5.4 cm  tan-pink to pink-red well-defined sessile polypoid mass which is up to  0.5 cm thick.  The specimen is oriented as follows: Pink pin at  proximal, red pin at right, green pin at distal, yellow pin at left  lateral.  The specimen is inked as follows: Proximal margin orange,  right margin red, distal margin green, left lateral margin yellow, deep  margin black.  On sectioning, the juncture of polypoid mass and  muscularis is well-defined, with no [place] Dani invasion.  Block summary:  Blocks 1-7 = full-thickness central sections to include polypoid mass  and deep margin  Blocks 8, 9 = radial sections of proximal margin  Block 10 = radial sections of right margin  Blocks 11, 12 = radial sections of distal margin  Blocks 13, 14 = radial sections of left lateral margin   SW 01/02/2022    Final Diagnosis  performed by Jaquita Folds, MD.   Electronically  signed 01/06/2022  Technical and / or Professional components performed at [facility], [place] [address] [place], [place] [address].   Immunohistochemistry Technical component (if applicable) was performed  at [facility]. [address], [place],  [place], [place] [address].   IMMUNOHISTOCHEMISTRY  DISCLAIMER (if applicable):  Some of these immunohistochemical stains may have been developed and the  performance characteristics determine by [facility]. Some  may not have been cleared or approved by the U.S. Food and Drug  Administration. The FDA has determined that such clearance or approval  is not necessary. This test is used for clinical purposes. It should not  be regarded as investigational or for research. This laboratory is  certified under the [place]  (CLIA-88) as qualified to perform high complexity clinical laboratory  testing.  The controls stained appropriately.  Assessment   Struggling with multiple medical problems  [facility] Stay = 2 days)  Plan:  -No recurrent bleeding episodes 1 month after TEM partial proctectomy of bulky prolapsing adenomatous polyp causing symptomatic anemia/bleeding and incontinence.  He is slowly getting better there.  CAT scan raises concerns of something going the rectum, most likely has some separation of his closure.  That will secondarily close intraluminally.  Nothing else to do more aggressively.  There is no need for antibiotics from my standpoint.  He is no longer having significant rectal bleeding from the polyp or postop.  His incontinence is already better.  Those are guardedly reassuring signs.    Symptoms seem to be improved with Desitin.  Continue sitz bath's to help keep the area clean.  Scheduled Tylenol.  Oxycodone for breakthrough pain.  Aggressive bowel regimen to avoid severe constipation.  Postoperative urinary tension in the setting of BPH.  Failed voiding trial and now has Foley catheter back in.  Unfortunately has possible UTI as well.  I discussed with Dr. Louis Meckel with [facility].  Their group was to see see him yesterday but patient got admitted.  He will see if it is reasonable to attempt removal as inpatient versus having close outpatient follow-up to  reattempt voiding trial once patient gets discharged. -VTE prophylaxis- SCDs, etc -mobilize as tolerated to help recovery  Disposition: Per primary service.  Suspect he can return to skilled nursing facility soon.   I reviewed nursing notes, hospitalist notes, last 24 h vitals and pain scores, last 48 h intake and output, last 24 h labs and trends, and last 24 h imaging results. I have reviewed this patient's available data, including medical history, events of note, test results, etc as part of my evaluation.  A significant portion of that time was spent in counseling.  Care during the described time interval was provided by me.  This care required moderate level of medical decision making.  01/30/2022    Subjective: (Chief complaint)  Patient readmitted with some failure to thrive.  Found to be hyponatremic.  Recommendation made to have been at [facility] given his colorectal and urological issues.  Patient in room reading paper.  Wife in room as well.  He denies any nausea or vomiting.  Tolerating liquids and wants to eat more.  He notes his pain is better controlled with the topical agent and the oxycodone.  Foley urine output appears to be clear.  They had missed a urology appointment and his wife is worried about missing urology follow-up.  Objective:  Vital signs:  Vitals:   01/29/22 1853 01/29/22 2241 01/30/22 0257 01/30/22 0604  BP: (!) 123/59 120/65 132/76 115/66  Pulse: 69 68 86 77  Resp: '20 12 12 14  '$ Temp: 98.6 F (37 C) (!) 97.5 F (36.4 C) 97.7 F (36.5 C) (!) 97.5 F (36.4 C)  TempSrc:  Oral Oral Oral  SpO2: 100% 100% 99% 99%  Weight:      Height:        Last BM Date : 01/29/22  Intake/Output   Yesterday:  09/28 0701 - 09/29 0700 In: 1120 [P.O.:1120] Out: 1450 [Urine:1450] This shift:  No intake/output data recorded.  Bowel function:  Flatus: YES  BM:  YES  Drain: (No drain)   Physical Exam:  General: Pt awake/alert in no acute  distress Eyes: PERRL, normal EOM.  Sclera clear.  No icterus Neuro: CN II-XII intact w/o focal sensory/motor deficits. Lymph: No head/neck/groin lymphadenopathy Psych:  No delerium/psychosis/paranoia.  Oriented x 3 HENT: Normocephalic, Mucus membranes moist.  No thrush Neck: Supple, No tracheal deviation.  No obvious thyromegaly Chest: No pain to chest wall compression.  Good respiratory excursion.  No audible wheezing CV:  Pulses intact.  Regular rhythm.  No major extremity edema MS: Still very stiff but can roll to the side by himself.  Challenge for him to bear weight.  No obvious deformity Abdomen: Soft.  Nondistended.  Mildly tender at incisions only.  No evidence of peritonitis.  No incarcerated hernias.  GU: Foley catheter in place.  Clear yellow urine  Rectal: Intact sphincter tone.  No prolapsing tissue or bleeding.  No external hemorrhoids.  Diaper rash cream and stay.  Mild pruritus.  Improved compared to preop no guarding or chandelier sign.  No abscess or fissure.  Ext:   No deformity.  Less pitting edema in lower extremities compared to when I saw him in the office earlier this week.  Improved.   No cyanosis Skin: No petechiae / purpurea.  No major sores.  Warm and dry    Results:   Cultures: Recent Results (from the past 720 hour(s))  Urine Culture     Status: None   Collection Time: 01/08/22 11:07 AM   Specimen: Urine, Catheterized  Result Value Ref Range Status   Specimen Description   Final    URINE, CATHETERIZED Performed at [facility], [address], [place], [place] [address]    Special Requests   Final    NONE Performed at [facility], [address] [place] [address] [place], [place] [address]    Culture   Final    NO GROWTH Performed at [facility], [place] [address] [place], [place] [address]    Report Status 01/09/2022 FINAL  Final  Culture, blood (Routine X 2) w Reflex to ID Panel     Status: None (Preliminary result)   Collection Time: 01/28/22 10:37 PM    Specimen: Left Antecubital; Blood  Result Value Ref Range Status   Specimen Description LEFT ANTECUBITAL  Final   Special Requests   Final    BOTTLES DRAWN AEROBIC AND ANAEROBIC Blood Culture adequate volume   Culture   Final    NO GROWTH 2 DAYS Performed at [facility], [address], [place],  [address]    Report Status PENDING  Incomplete  Culture, blood (Routine X 2) w Reflex to ID Panel     Status: None (Preliminary result)   Collection Time: 01/28/22 10:37 PM   Specimen: BLOOD RIGHT HAND  Result Value Ref  Range Status   Specimen Description BLOOD RIGHT HAND  Final   Special Requests   Final    BOTTLES DRAWN AEROBIC AND ANAEROBIC Blood Culture adequate volume   Culture   Final    NO GROWTH 2 DAYS Performed at [facility], [address] [place], [place] [address]    Report Status PENDING  Incomplete    Labs: Results for orders placed or performed during the hospital encounter of 01/28/22 (from the past 48 hour(s))  CBC with Differential     Status: Abnormal   Collection Time: 01/28/22  1:37 PM  Result Value Ref Range   WBC 15.3 (H) 4.0 - 10.5 K/uL   RBC 3.15 (L) 4.22 - 5.81 MIL/uL   Hemoglobin 9.6 (L) 13.0 - 17.0 g/dL   HCT 28.1 (L) 39.0 - 52.0 %   MCV 89.2 80.0 - 100.0 fL   MCH 30.5 26.0 - 34.0 pg   MCHC 34.2 30.0 - 36.0 g/dL   RDW 13.8 11.5 - 15.5 %   Platelets 392 150 - 400 K/uL   nRBC 0.0 0.0 - 0.2 %   Neutrophils Relative % 73 %   Neutro Abs 11.2 (H) 1.7 - 7.7 K/uL   Lymphocytes Relative 20 %   Lymphs Abs 3.0 0.7 - 4.0 K/uL   Monocytes Relative 6 %   Monocytes Absolute 0.9 0.1 - 1.0 K/uL   Eosinophils Relative 0 %   Eosinophils Absolute 0.1 0.0 - 0.5 K/uL   Basophils Relative 0 %   Basophils Absolute 0.0 0.0 - 0.1 K/uL   Immature Granulocytes 1 %   Abs Immature Granulocytes 0.09 (H) 0.00 - 0.07 K/uL    Comment: Performed at [facility], [address], [place], [place] [address]  Comprehensive metabolic panel     Status: Abnormal   Collection  Time: 01/28/22  1:37 PM  Result Value Ref Range   Sodium 122 (L) 135 - 145 mmol/L   Potassium 4.6 3.5 - 5.1 mmol/L   Chloride 86 (L) 98 - 111 mmol/L   CO2 24 22 - 32 mmol/L   Glucose, Bld 169 (H) 70 - 99 mg/dL    Comment: Glucose reference range applies only to samples taken after fasting for at least 8 hours.   BUN 52 (H) 8 - 23 mg/dL   Creatinine, Ser 1.91 (H) 0.61 - 1.24 mg/dL   Calcium 8.2 (L) 8.9 - 10.3 mg/dL   Total Protein 6.6 6.5 - 8.1 g/dL   Albumin 2.4 (L) 3.5 - 5.0 g/dL   AST 22 15 - 41 U/L   ALT 16 0 - 44 U/L   Alkaline Phosphatase 81 38 - 126 U/L   Total Bilirubin 0.8 0.3 - 1.2 mg/dL   GFR, Estimated 34 (L) >60 mL/min    Comment: (NOTE) Calculated using the CKD-EPI Creatinine Equation (2021)    Anion gap 12 5 - 15    Comment: Performed at [facility], [address], [place], [place] [address]  Magnesium     Status: None   Collection Time: 01/28/22  1:37 PM  Result Value Ref Range   Magnesium 1.9 1.7 - 2.4 mg/dL    Comment: Performed at [facility], [address] [place], [place] [address]  Urinalysis, Routine w reflex microscopic Urine, Catheterized     Status: Abnormal   Collection Time: 01/28/22  2:55 PM  Result Value Ref Range   Color, Urine YELLOW YELLOW   APPearance HAZY (A) CLEAR   Specific Gravity, Urine 1.010 1.005 -  1.030   pH 5.0 5.0 - 8.0   Glucose, UA NEGATIVE NEGATIVE mg/dL   Hgb urine dipstick MODERATE (A) NEGATIVE   Bilirubin Urine NEGATIVE NEGATIVE   Ketones, ur NEGATIVE NEGATIVE mg/dL   Protein, ur NEGATIVE NEGATIVE mg/dL   Nitrite NEGATIVE NEGATIVE   Leukocytes,Ua LARGE (A) NEGATIVE   RBC / HPF 11-20 0 - 5 RBC/hpf   WBC, UA >50 (H) 0 - 5 WBC/hpf   Bacteria, UA FEW (A) NONE SEEN   Squamous Epithelial / LPF 0-5 0 - 5   WBC Clumps PRESENT    Budding Yeast PRESENT    Ca Oxalate Crys, UA PRESENT     Comment: Performed at [facility], [address] [place], [place] [address]  Sodium, urine, random     Status: None   Collection Time:  01/28/22  2:55 PM  Result Value Ref Range   Sodium, Ur 55 mmol/L    Comment: Performed at [facility], [address], [place], [place] [address]  Osmolality, urine     Status: Abnormal   Collection Time: 01/28/22 10:13 PM  Result Value Ref Range   Osmolality, Ur 236 (L) 300 - 900 mOsm/kg    Comment: Performed at [facility], [place] [place], [place] [address]  Culture, blood (Routine X 2) w Reflex to ID Panel     Status: None (Preliminary result)   Collection Time: 01/28/22 10:37 PM   Specimen: Left Antecubital; Blood  Result Value Ref Range   Specimen Description LEFT ANTECUBITAL    Special Requests      BOTTLES DRAWN AEROBIC AND ANAEROBIC Blood Culture adequate volume   Culture      NO GROWTH 2 DAYS Performed at [facility], [address], [place], [place] [address]    Report Status PENDING   Culture, blood (Routine X 2) w Reflex to ID Panel     Status: None (Preliminary result)   Collection Time: 01/28/22 10:37 PM   Specimen: BLOOD RIGHT HAND  Result Value Ref Range   Specimen Description BLOOD RIGHT HAND    Special Requests      BOTTLES DRAWN AEROBIC AND ANAEROBIC Blood Culture adequate volume   Culture      NO GROWTH 2 DAYS Performed at [facility], [address], [place], [place] [address]    Report Status PENDING   Osmolality     Status: None   Collection Time: 01/28/22 10:37 PM  Result Value Ref Range   Osmolality 283 275 - 295 mOsm/kg    Comment: Performed at [facility], [place] [place], [place] [address]  Lactic acid, plasma     Status: None   Collection Time: 01/28/22 10:57 PM  Result Value Ref Range   Lactic Acid, Venous 1.1 0.5 - 1.9 mmol/L    Comment: Performed at [facility], [address], [place],  [address]  CBG monitoring, ED     Status: Abnormal   Collection Time: 01/28/22 11:16 PM  Result Value Ref Range   Glucose-Capillary 213 (H) 70 - 99 mg/dL    Comment: Glucose reference range applies only to  samples taken after fasting for at least 8 hours.  Basic metabolic panel     Status: Abnormal   Collection Time: 01/29/22  4:59 AM  Result Value Ref Range   Sodium 125 (L) 135 - 145 mmol/L   Potassium 4.2 3.5 - 5.1 mmol/L   Chloride 93 (L) 98 - 111 mmol/L   CO2 26 22 -  32 mmol/L   Glucose, Bld 80 70 - 99 mg/dL    Comment: Glucose reference range applies only to samples taken after fasting for at least 8 hours.   BUN 46 (H) 8 - 23 mg/dL   Creatinine, Ser 1.71 (H) 0.61 - 1.24 mg/dL   Calcium 8.0 (L) 8.9 - 10.3 mg/dL   GFR, Estimated 39 (L) >60 mL/min    Comment: (NOTE) Calculated using the CKD-EPI Creatinine Equation (2021)    Anion gap 6 5 - 15    Comment: Performed at [facility], [address], [place], [place] [address]  CBC     Status: Abnormal   Collection Time: 01/29/22  4:59 AM  Result Value Ref Range   WBC 13.5 (H) 4.0 - 10.5 K/uL   RBC 3.04 (L) 4.22 - 5.81 MIL/uL   Hemoglobin 9.2 (L) 13.0 - 17.0 g/dL   HCT 27.1 (L) 39.0 - 52.0 %   MCV 89.1 80.0 - 100.0 fL   MCH 30.3 26.0 - 34.0 pg   MCHC 33.9 30.0 - 36.0 g/dL   RDW 13.7 11.5 - 15.5 %   Platelets 387 150 - 400 K/uL   nRBC 0.0 0.0 - 0.2 %    Comment: Performed at [facility], [address] [place], [place] [address]  Brain natriuretic peptide     Status: Abnormal   Collection Time: 01/29/22  4:59 AM  Result Value Ref Range   B Natriuretic Peptide 527.0 (H) 0.0 - 100.0 pg/mL    Comment: Performed at [facility], [address], [place], [place] [address]  Glucose, capillary     Status: None   Collection Time: 01/29/22  5:48 AM  Result Value Ref Range   Glucose-Capillary 90 70 - 99 mg/dL    Comment: Glucose reference range applies only to samples taken after fasting for at least 8 hours.  Glucose, capillary     Status: Abnormal   Collection Time: 01/29/22 11:39 AM  Result Value Ref Range   Glucose-Capillary 136 (H) 70 - 99 mg/dL    Comment: Glucose reference range applies only to samples taken after fasting for  at least 8 hours.  Glucose, capillary     Status: Abnormal   Collection Time: 01/29/22  7:56 PM  Result Value Ref Range   Glucose-Capillary 213 (H) 70 - 99 mg/dL    Comment: Glucose reference range applies only to samples taken after fasting for at least 8 hours.  Glucose, capillary     Status: Abnormal   Collection Time: 01/29/22 11:41 PM  Result Value Ref Range   Glucose-Capillary 117 (H) 70 - 99 mg/dL    Comment: Glucose reference range applies only to samples taken after fasting for at least 8 hours.  Glucose, capillary     Status: Abnormal   Collection Time: 01/30/22  5:22 AM  Result Value Ref Range   Glucose-Capillary 115 (H) 70 - 99 mg/dL    Comment: Glucose reference range applies only to samples taken after fasting for at least 8 hours.  Basic metabolic panel     Status: Abnormal   Collection Time: 01/30/22  5:55 AM  Result Value Ref Range   Sodium 130 (L) 135 - 145 mmol/L   Potassium 4.0 3.5 - 5.1 mmol/L   Chloride 91 (L) 98 - 111 mmol/L   CO2 29 22 - 32 mmol/L   Glucose, Bld 114 (H) 70 - 99 mg/dL    Comment: Glucose reference range applies only to samples taken after fasting  for at least 8 hours.   BUN 43 (H) 8 - 23 mg/dL   Creatinine, Ser 1.75 (H) 0.61 - 1.24 mg/dL   Calcium 8.5 (L) 8.9 - 10.3 mg/dL   GFR, Estimated 38 (L) >60 mL/min    Comment: (NOTE) Calculated using the CKD-EPI Creatinine Equation (2021)    Anion gap 10 5 - 15    Comment: Performed at [facility], [place] [address], [place], [place] [address]  CBC     Status: Abnormal   Collection Time: 01/30/22  5:55 AM  Result Value Ref Range   WBC 10.5 4.0 - 10.5 K/uL   RBC 3.31 (L) 4.22 - 5.81 MIL/uL   Hemoglobin 9.9 (L) 13.0 - 17.0 g/dL   HCT 30.1 (L) 39.0 - 52.0 %   MCV 90.9 80.0 - 100.0 fL   MCH 29.9 26.0 - 34.0 pg   MCHC 32.9 30.0 - 36.0 g/dL   RDW 13.9 11.5 - 15.5 %   Platelets 422 (H) 150 - 400 K/uL   nRBC 0.0 0.0 - 0.2 %    Comment: Performed at [facility],  [place] [address], [place],  [address]    Imaging / Studies: CT ABDOMEN PELVIS W CONTRAST  Addendum Date: 01/28/2022   ADDENDUM REPORT: 01/28/2022 15:49 ADDENDUM: These results were called by telephone at the time of interpretation on 01/28/2022 at 3:48 pm to provider HAYLEY NAASZ , who verbally acknowledged these results. Electronically Signed   By: Zetta Bills M.D.   On: 01/28/2022 15:49   Result Date: 01/28/2022 CLINICAL DATA:  Acute abdominal pain, nonlocalized abdominal pain in an 81 year old. Blood in urine. Post recent rectal biopsy. History of lymphoma. * Tracking Code: BO * EXAM: CT ABDOMEN AND PELVIS WITH CONTRAST TECHNIQUE: Multidetector CT imaging of the abdomen and pelvis was performed using the standard protocol following bolus administration of intravenous contrast. RADIATION DOSE REDUCTION: This exam was performed according to the departmental dose-optimization program which includes automated exposure control, adjustment of the mA and/or kV according to patient size and/or use of iterative reconstruction technique. CONTRAST:  74m OMNIPAQUE IOHEXOL 300 MG/ML  SOLN COMPARISON:  November 28, 2021 FINDINGS: Lower chest: Basilar atelectasis. No effusion or consolidative changes. Calcified coronary artery disease, incompletely evaluated. Small pericardial effusion of similar volume to previous imaging. Hepatobiliary: No focal, suspicious hepatic lesion. No pericholecystic stranding. No biliary duct dilation. Portal vein is patent. Pancreas: Mild atrophy of the pancreas without ductal dilation, inflammation or visible lesion. Spleen: Normal. Adrenals/Urinary Tract: Cortical scarring of the bilateral kidneys without visible lesion or hydronephrosis. Urinary bladder is decompressed with Foley catheter in place. There is substantial perivesical stranding. There is no hydronephrosis. Adrenal glands are normal. Stomach/Bowel: Stomach without signs of adjacent stranding. No small bowel dilation.  Colonic diverticulosis. Stranding about the rectum. Perirectal stranding is moderate to marked along the LEFT rectal wall. There is hypoenhancement of the LEFT rectal wall. Stranding extends to the pelvic floor towards the upper margin of the anal sphincter. Integrity of the distal rectum just above the pelvic floor is difficult to assess. Patulous appearance of the distal rectum abutting the posterior prostate and puborectalis muscle on the LEFT just below the level of greatest stranding. No disseminated gas about the mesorectum. Vascular/Lymphatic: Aortic atherosclerosis. No sign of aneurysm. Smooth contour of the IVC. There is no gastrohepatic or hepatoduodenal ligament lymphadenopathy. No retroperitoneal or mesenteric lymphadenopathy. No pelvic sidewall lymphadenopathy. Reproductive: Prostate unremarkable by CT. Other: No pneumoperitoneum.  No ascites. Musculoskeletal: Levoconvex  curvature of the thoracic/lumbar spine. Degenerative changes of the spine. No focal destructive bone lesion. IMPRESSION: 1. Perivesical stranding raising concern for cystitis. Correlate with urinalysis. This is associated with bladder wall thickening as well. 2. Patulous appearance of the distal rectum falling polypectomy versus is contained rectal wall violation/perforation following polypectomy associated with abundant stranding in the mesorectum. There is no disseminated gas or focal fluid. There is abundant stranding above the site in the pelvis. The somewhat angulated appearance of gas which abuts the posterior prostate and puborectalis muscle raises the question of violation of the rectal wall. Close follow-up is suggested with consideration for water-soluble contrast enema performed other with fluoroscopy or CT. Based on low position of the abnormality just above the sphincter complex would suggest utilization of Foley catheter rather than larger rectal tube if this is performed. 3. Colonic diverticulosis without evidence of  acute diverticulitis. 4. Small pericardial effusion of similar volume to previous imaging. 5. Calcified coronary artery disease, incompletely evaluated. 6. Aortic atherosclerosis. Electronically Signed: By: Zetta Bills M.D. On: 01/28/2022 15:39    Medications / Allergies: per chart  Antibiotics: Anti-infectives (From admission, onward)    Start     Dose/Rate Route Frequency Ordered Stop   01/28/22 2300  piperacillin-tazobactam (ZOSYN) IVPB 3.375 g        3.375 g 12.5 mL/hr over 240 Minutes Intravenous Every 8 hours 01/28/22 2247     01/28/22 2200  piperacillin-tazobactam (ZOSYN) IVPB 3.375 g  Status:  Discontinued        3.375 g 12.5 mL/hr over 240 Minutes Intravenous Every 8 hours 01/28/22 1603 01/28/22 2323   01/28/22 1800  piperacillin-tazobactam (ZOSYN) IVPB 3.375 g  Status:  Discontinued        3.375 g 12.5 mL/hr over 240 Minutes Intravenous Every 6 hours 01/28/22 1558 01/28/22 1600   01/28/22 1615  piperacillin-tazobactam (ZOSYN) IVPB 2.25 g  Status:  Discontinued        2.25 g 100 mL/hr over 30 Minutes Intravenous Every 8 hours 01/28/22 1600 01/28/22 1603   01/28/22 1615  piperacillin-tazobactam (ZOSYN) IVPB 3.375 g        3.375 g 100 mL/hr over 30 Minutes Intravenous  Once 01/28/22 1603 01/28/22 1712         Note: Portions of this report may have been transcribed using voice recognition software. Every effort was made to ensure accuracy; however, inadvertent computerized transcription errors may be present.   Any transcriptional errors that result from this process are unintentional.    Adin Hector, MD, FACS, MASCRS Esophageal, Gastrointestinal & Colorectal Surgery Robotic and Minimally Invasive Surgery  [facility] [employer] [address] [place], [place] [address] [phone] Fax [phone] Main  CONTACT INFORMATION:  Weekday (9AM-5PM): Call [facility] main office at [phone]  Weeknight (5PM-9AM)  or Weekend/Holiday: Check [email or web address] (password " [email or web address]") for General Surgery [facility] coverage  (Please, do not use SecureChat as it is not reliable communication to reach operating surgeons for immediate patient care given surgeries/outpatient duties/clinic/cross-coverage/off post-call which would lead to a delay in care.  Epic staff messaging available for outptient concerns, but may not be answered for 48 hours or more).     01/30/2022  10:08 AM

## 2022-01-31 DIAGNOSIS — K6289 Other specified diseases of anus and rectum: Secondary | ICD-10-CM | POA: Diagnosis not present

## 2022-01-31 DIAGNOSIS — Z7189 Other specified counseling: Secondary | ICD-10-CM | POA: Diagnosis not present

## 2022-01-31 DIAGNOSIS — I350 Nonrheumatic aortic (valve) stenosis: Secondary | ICD-10-CM | POA: Diagnosis not present

## 2022-01-31 DIAGNOSIS — E1122 Type 2 diabetes mellitus with diabetic chronic kidney disease: Secondary | ICD-10-CM | POA: Diagnosis not present

## 2022-01-31 DIAGNOSIS — R5381 Other malaise: Secondary | ICD-10-CM

## 2022-01-31 DIAGNOSIS — E871 Hypo-osmolality and hyponatremia: Secondary | ICD-10-CM | POA: Diagnosis not present

## 2022-01-31 LAB — CBC
HCT: 27.6 % — ABNORMAL LOW (ref 39.0–52.0)
Hemoglobin: 9.1 g/dL — ABNORMAL LOW (ref 13.0–17.0)
MCH: 30 pg (ref 26.0–34.0)
MCHC: 33 g/dL (ref 30.0–36.0)
MCV: 91.1 fL (ref 80.0–100.0)
Platelets: 393 10*3/uL (ref 150–400)
RBC: 3.03 MIL/uL — ABNORMAL LOW (ref 4.22–5.81)
RDW: 13.8 % (ref 11.5–15.5)
WBC: 10 10*3/uL (ref 4.0–10.5)
nRBC: 0 % (ref 0.0–0.2)

## 2022-01-31 LAB — GLUCOSE, CAPILLARY
Glucose-Capillary: 104 mg/dL — ABNORMAL HIGH (ref 70–99)
Glucose-Capillary: 154 mg/dL — ABNORMAL HIGH (ref 70–99)
Glucose-Capillary: 191 mg/dL — ABNORMAL HIGH (ref 70–99)
Glucose-Capillary: 263 mg/dL — ABNORMAL HIGH (ref 70–99)
Glucose-Capillary: 287 mg/dL — ABNORMAL HIGH (ref 70–99)

## 2022-01-31 LAB — RENAL FUNCTION PANEL
Albumin: 2.2 g/dL — ABNORMAL LOW (ref 3.5–5.0)
Anion gap: 11 (ref 5–15)
BUN: 46 mg/dL — ABNORMAL HIGH (ref 8–23)
CO2: 27 mmol/L (ref 22–32)
Calcium: 8.2 mg/dL — ABNORMAL LOW (ref 8.9–10.3)
Chloride: 91 mmol/L — ABNORMAL LOW (ref 98–111)
Creatinine, Ser: 1.79 mg/dL — ABNORMAL HIGH (ref 0.61–1.24)
GFR, Estimated: 37 mL/min — ABNORMAL LOW (ref >60)
Glucose, Bld: 111 mg/dL — ABNORMAL HIGH (ref 70–99)
Phosphorus: 3.8 mg/dL (ref 2.5–4.6)
Potassium: 4 mmol/L (ref 3.5–5.1)
Sodium: 129 mmol/L — ABNORMAL LOW (ref 135–145)

## 2022-01-31 LAB — MAGNESIUM: Magnesium: 1.5 mg/dL — ABNORMAL LOW (ref 1.7–2.4)

## 2022-01-31 LAB — URINE CULTURE: Culture: 80000 — AB

## 2022-01-31 LAB — BRAIN NATRIURETIC PEPTIDE: B Natriuretic Peptide: 571.4 pg/mL — ABNORMAL HIGH (ref 0.0–100.0)

## 2022-01-31 MED ORDER — CEFAZOLIN SODIUM-DEXTROSE 2-4 GM/100ML-% IV SOLN
2.0000 g | Freq: Once | INTRAVENOUS | Status: AC
  Administered 2022-02-01: 2 g via INTRAVENOUS
  Filled 2022-01-31: qty 100

## 2022-01-31 MED ORDER — MAGNESIUM SULFATE 2 GM/50ML IV SOLN
2.0000 g | Freq: Once | INTRAVENOUS | Status: AC
  Administered 2022-01-31: 2 g via INTRAVENOUS
  Filled 2022-01-31: qty 50

## 2022-01-31 NOTE — TOC Progression Note (Addendum)
Transition of Care [facility]) - Progression Note    Patient Details  Name: Bradley Sheppard MRN: [id number] Date of Birth: Nov 29, 1940  Transition of Care [facility]) CM/SW Contact  Ross Ludwig, [place] Phone Number: 01/31/2022, 12:15 PM  Clinical Narrative:     CSW received phone call from [facility], they are offering a peer to peer in regards to SNF placement at [facility].  EMS approval is also pending.  They requested it be completed by 3pm, and to call Madelaine Bhat, [phone].  CSW notified attending physician.  12:55pm  Physician attempted to complete peer to peer and there was no answer.  CSW tried to call [employer] on call [phone] to confirm number for peer to peer and had to leave a message, waiting for a call back.  1:42pm  CSW received phone call back from [facility] to confirm phone number for patient to go to SNF, CSW confirmed phone number is correct for physician to complete peer to peer.  2:07pm  CSW received message from attending physician, that peer to peer has been completed, and patient has been approved for SNF placement.  3:33pm  CSW received phone call from [facility], patient has been approved for EMS and SNF placement.  EMS approval reference number is [id number], SNF approval is [id number], valid for 5 days.  TOC to continue to follow patient's progress throughout discharge planning.  Expected Discharge Plan: [place] Barriers to Discharge: Continued Medical Work up  Expected Discharge Plan and Services Expected Discharge Plan: [place] In-house Referral: Clinical Social Work Discharge Planning Services: CM Consult   Living arrangements for the past 2 months: Single Family Home                                       Social Determinants of Health (SDOH) Interventions    Readmission Risk Interventions    01/29/2022    1:26 PM 01/10/2022    8:53 AM 01/01/2022    2:42 PM  Readmission Risk  Prevention Plan  Transportation Screening Complete Complete Complete  PCP or Specialist Appt within 3-5 Days  Not Complete   HRI or [place]  Complete   Social Work Consult for [place] Planning/Counseling  Complete   Palliative Care Screening  Not Complete   Medication Review Press photographer) Complete Complete   HRI or [place] Complete    SW Recovery Care/Counseling Consult Complete    [place] Not Applicable

## 2022-01-31 NOTE — Plan of Care (Signed)
  Problem: Education: Goal: Understanding of discharge needs will improve Outcome: Progressing Goal: Verbalization of understanding of the causes of altered bowel function will improve Outcome: Progressing   Problem: Activity: Goal: Ability to tolerate increased activity will improve Outcome: Progressing   Problem: Bowel/Gastric: Goal: Gastrointestinal status for postoperative course will improve Outcome: Progressing   Problem: Health Behavior/Discharge Planning: Goal: Identification of community resources to assist with postoperative recovery needs will improve Outcome: Progressing   Problem: Nutritional: Goal: Will attain and maintain optimal nutritional status will improve Outcome: Progressing   Problem: Clinical Measurements: Goal: Postoperative complications will be avoided or minimized Outcome: Progressing   Problem: Respiratory: Goal: Respiratory status will improve Outcome: Progressing   Problem: Skin Integrity: Goal: Will show signs of wound healing Outcome: Progressing   Problem: Education: Goal: Ability to describe self-care measures that may prevent or decrease complications (Diabetes Survival Skills Education) will improve Outcome: Progressing Goal: Individualized Educational Video(s) Outcome: Progressing   Problem: Coping: Goal: Ability to adjust to condition or change in health will improve Outcome: Progressing   Problem: Fluid Volume: Goal: Ability to maintain a balanced intake and output will improve Outcome: Progressing   Problem: Health Behavior/Discharge Planning: Goal: Ability to identify and utilize available resources and services will improve Outcome: Progressing Goal: Ability to manage health-related needs will improve Outcome: Progressing   Problem: Metabolic: Goal: Ability to maintain appropriate glucose levels will improve Outcome: Progressing   Problem: Nutritional: Goal: Maintenance of adequate nutrition will improve Outcome:  Progressing Goal: Progress toward achieving an optimal weight will improve Outcome: Progressing   Problem: Skin Integrity: Goal: Risk for impaired skin integrity will decrease Outcome: Progressing   Problem: Tissue Perfusion: Goal: Adequacy of tissue perfusion will improve Outcome: Progressing   Problem: Education: Goal: Knowledge of General Education information will improve Description: Including pain rating scale, medication(s)/side effects and non-pharmacologic comfort measures Outcome: Progressing   Problem: Health Behavior/Discharge Planning: Goal: Ability to manage health-related needs will improve Outcome: Progressing   Problem: Clinical Measurements: Goal: Ability to maintain clinical measurements within normal limits will improve Outcome: Progressing Goal: Will remain free from infection Outcome: Progressing Goal: Diagnostic test results will improve Outcome: Progressing Goal: Respiratory complications will improve Outcome: Progressing Goal: Cardiovascular complication will be avoided Outcome: Progressing   Problem: Activity: Goal: Risk for activity intolerance will decrease Outcome: Progressing   Problem: Nutrition: Goal: Adequate nutrition will be maintained Outcome: Progressing   Problem: Coping: Goal: Level of anxiety will decrease Outcome: Progressing   Problem: Elimination: Goal: Will not experience complications related to bowel motility Outcome: Progressing Goal: Will not experience complications related to urinary retention Outcome: Progressing   Problem: Pain Managment: Goal: General experience of comfort will improve Outcome: Progressing   Problem: Safety: Goal: Ability to remain free from injury will improve Outcome: Progressing   Problem: Skin Integrity: Goal: Risk for impaired skin integrity will decrease Outcome: Progressing

## 2022-01-31 NOTE — Progress Notes (Addendum)
PROGRESS NOTE  Bradley Sheppard:[id number] DOB: [birth year]   PCP: [name], MD  Patient is from: SNF  DOA: 01/28/2022 LOS: 3  Chief complaints Chief Complaint  Patient presents with   Abnormal Lab     Brief Narrative / Interim history: 81 year old M with PMH of NOB-0J, diastolic CHF, HTN, DM-2, rectal mass status post partial proctectomy by transanal endoscopic microsurgery on 12/31/21, urinary retention with chronic Foley and Hodgkin's lymphoma sent to [facility] ED by his oncologist after he presented there with general weakness, malaise, abdominal pain and burning sensation in the rectal area.  He was hyponatremic to 121.  Leukocytosis to 15.3.  CT abdomen and pelvis showed perivesical stranding raising concern for cystitis and possible rectal perforation.  Patient was started on IV Zosyn.  General surgery consulted and recommended transfer to [facility] for further evaluation and care.  After evaluation, surgical team did not feel there is need for surgical intervention or antibiotic, and recommended sitz bath's, scheduled Tylenol with as needed oxycodone and bowel regimen to avoid constipation.   In regards to hyponatremia, felt to be dilutional in the setting of volume.  Started on IV Lasix with improvement.  In regards to catheter associated UTI, urine culture grew pansensitive E. coli.  Antibiotic de-escalated to IV ceftriaxone, then to IV Ancef after sensitivity.  Catheter was exchanged at [facility] on 9/28.  Therapy recommended SNF.  Insurance asking for peer to peer review but no answer when I tried to call at 12:45 PM on 9/30.  Phone call went to generic voicemail.  I did not leave voicemail other than asking for call back.    Subjective: Seen and examined earlier this morning.  No major events overnight of this morning.  No complaints other than pain in his rectum.  Denies chest pain, dyspnea, palpitation, nausea or vomiting.  Tolerating dysphagia  1 diet.  We have discussed about CODE STATUS.  Initially wanted to be full code but after we discussed and pros and cons, he is inclined to be DNR/DNI but wants me to discuss with his wife first.   Objective: Vitals:   01/30/22 1436 01/30/22 1952 01/31/22 0425 01/31/22 1319  BP: 105/63 (!) 99/54 109/68 120/62  Pulse: 76 81 74 82  Resp: '17 16 18 20  '$ Temp: (!) 97.5 F (36.4 C) 98 F (36.7 C) 98.3 F (36.8 C) 97.7 F (36.5 C)  TempSrc: Oral Oral Oral Oral  SpO2: 99% 98% 99% 98%  Weight:      Height:        Examination:  GENERAL: No apparent distress.  Nontoxic. HEENT: MMM.  Vision and hearing grossly intact.  NECK: Supple.  No apparent JVD.  RESP:  No IWOB.  Fair aeration bilaterally. CVS:  RRR. Heart sounds normal.  ABD/GI/GU: BS+. Abd soft, NTND.  Foley catheter in place. MSK/EXT:  Moves extremities. No apparent deformity.  Trace BLE edema. SKIN: Stage III coccygeal pressure ulcer NEURO: Awake and alert. Oriented appropriately.  No apparent focal neuro deficit. PSYCH: Calm. Normal affect.   Procedures:  None  Microbiology summarized: Blood cultures NGTD Urine culture with 80,000 colonies of E. coli.  Assessment and plan: Principal Problem:   Hyponatremia Active Problems:   Moderate-Severe Aortic stenosis   DMII (diabetes mellitus, type 2) (HCC)   Rectal mass   Essential hypertension, benign   MALT lymphoma (HCC)   Acute on chronic diastolic CHF (congestive heart failure) (HCC)   Chronic kidney disease, stage 3b (  [facility])   Urinary retention   Sepsis due to gram-negative UTI [facility])   Atrial fibrillation (HCC)   Catheter-associated urinary tract infection (HCC)   Physical deconditioning  Hypervolemic hyponatremia: Na 121 on admission.  Recent Labs  Lab 01/28/22 1008 01/28/22 1337 01/29/22 0459 01/30/22 0555 01/31/22 0602  NA 121* 122* 125* 130* 129*  -Continue IV Lasix -Recommend discontinuing Actos on discharge due to risk for fluid retention  Sepsis due  to catheter associated E. coli UTI: POA.  Had leukocytosis and tachycardia POA.  Blood cultures NGTD.  Urine culture with pansensitive E. coli.  Catheter exchanged in ED. sepsis physiology resolving. -De-escalate antibiotic to IV Ancef to complete 5 days course  Acute on chronic diastolic CHF: TTE in the 11/4257 with LVEF of 65 to 70%, G2 DD, severe LAE, moderate RAE, moderate aortic stenosis and normal RVSP.  Was somewhat hypervolemic with hyponatremia on presentation.  BNP elevated to 527.  Started on IV Lasix.  Net -2.6 L.  Creatinine relatively stable. -Continue IV Lasix 40 mg twice daily -Continue holding home metolazone -Recommend discontinuing Actos on discharge -Strict intake and output, daily weight, renal functions and electrolytes   Rectal mass s/p resection with transanal endoscopic microsurgery on 12/31/2021: CT abdomen and pelvis raised concern for microperforation with felt to be less likely after evaluation by general surgery. -Surgery recommended  sitz bath's, scheduled Tylenol with as needed oxycodone and bowel regimen -Further care per surgery. -He is on dysphagia 1 diet.  For diet per general surgery.  Essential hypertension: Normotensive. -Continue home Coreg -Lasix as above   Uncontrolled NIDDM-2 with hyperglycemia: A1c 7.2% on 12/31/2021. Recent Labs  Lab 01/30/22 1159 01/30/22 1704 01/31/22 0051 01/31/22 0601 01/31/22 1151  GLUCAP 234* 168* 191* 104* 263*  -Continue current insulin regimen and adjust as appropriate -Recommend discontinuing Actos on discharge due to risk for fluid retention -Continue home Crestor  Paroxysmal A-fib: Not on anticoagulation due to history of frequent falls.  On aspirin only.CHADs2VASC score- at least 4.  -Continue home Coreg and low-dose aspirin   CKD stage IIIb: Stable. Recent Labs    01/08/22 1105 01/09/22 0317 01/10/22 0412 01/11/22 0507 01/13/22 0817 01/28/22 1008 01/28/22 1337 01/29/22 0459 01/30/22 0555  01/31/22 0602  BUN 47* 43* 43* 44* 55* 52* 52* 46* 43* 46*  CREATININE 1.79* 1.59* 1.66* 1.69* 1.91* 1.86* 1.91* 1.71* 1.75* 1.79*  -Continue monitoring  History of CAD:  Status post DES to LAD and RCA in 2003.  On aspirin, Coreg, Crestor at home. -Continue home medications   History of Hodgkin's lymphoma:  -Outpatient follow-up with oncology   Normocytic anemia: H&H stable. Recent Labs    01/09/22 0317 01/10/22 0412 01/11/22 0507 01/12/22 0448 01/13/22 0817 01/28/22 1008 01/28/22 1337 01/29/22 0459 01/30/22 0555 01/31/22 0602  HGB 8.9* 8.6* 8.5* 9.1* 8.9* 9.3* 9.6* 9.2* 9.9* 9.1*  -IV ferric gluconate -Monitor  Urinary retention with indwelling Foley catheter since rectal mass resection on 12/31/2021. -Foley catheter exchanged in ED -Continue home Flomax -Outpatient follow-up with urology for voiding trial  Goal of care counseling: Remains full code until further discussion with patient's wife.  Did not answer when I attempted to call.  Addendum: -See IPAL note  Physical deconditioning -PT/OT-recommended to return to SNF which I believe is appropriate given his significant deconditioning in the setting of acute hyponatremia, sepsis, acute on chronic diastolic CHF not to mention his coccygeal pressure ulcer and rectal pain and discomfort after rectal mass resection.  Patient's insurance wants to peer  review but did not answer the phone call.   Addendum No answer when I called Madelaine Bhat at [phone] for peer to peer for the second time but [facility] call back right away.  Approved insurance authorization for SNF.  TOC notified.  Body mass index is 25.63 kg/m.   Pressure skin injury: POA Pressure Injury 01/29/22 Coccyx Stage 3 -  Full thickness tissue loss. Subcutaneous fat may be visible but bone, tendon or muscle are NOT exposed. (Active)  01/29/22   Location: Coccyx  Location Orientation:   Staging: Stage 3 -  Full thickness tissue loss. Subcutaneous fat may be  visible but bone, tendon or muscle are NOT exposed.  Wound Description (Comments):   Present on Admission: Yes  Dressing Type Foam - Lift dressing to assess site every shift 01/29/22 1951   DVT prophylaxis:  SCDs Start: 01/28/22 2252  Code Status: Full code Family Communication: Attempted to call patient's wife for update but no answer. Level of care: Telemetry Status is: Inpatient Remains inpatient appropriate because: Hyponatremia, sepsis in the setting of catheter associated UTI, hyponatremia and acute on chronic diastolic CHF   Final disposition: SNF Consultants:  General surgery  Sch Meds:  Scheduled Meds:  carvedilol  12.5 mg Oral BID WC   Chlorhexidine Gluconate Cloth  6 each Topical Q0600   furosemide  40 mg Intravenous BID   insulin aspart  0-9 Units Subcutaneous Q6H   lip balm   Topical BID   liver oil-zinc oxide   Topical BID   LORazepam  0.5 mg Oral QHS   polycarbophil  625 mg Oral BID   [START ON 02/04/2022] rosuvastatin  5 mg Oral Q Wed   tamsulosin  0.4 mg Oral Daily   Continuous Infusions:  cefTRIAXone (ROCEPHIN)  IV 1 g (01/31/22 1209)   PRN Meds:.acetaminophen **OR** [DISCONTINUED] acetaminophen, alum & mag hydroxide-simeth, magic mouthwash, menthol-cetylpyridinium, morphine injection, ondansetron **OR** ondansetron (ZOFRAN) IV, mouth rinse, oxyCODONE, phenol, polyethylene glycol, simethicone, witch hazel-glycerin  Antimicrobials: Anti-infectives (From admission, onward)    Start     Dose/Rate Route Frequency Ordered Stop   01/30/22 1200  cefTRIAXone (ROCEPHIN) 1 g in sodium chloride 0.9 % 100 mL IVPB        1 g 200 mL/hr over 30 Minutes Intravenous Every 24 hours 01/30/22 1110 02/02/22 1159   01/28/22 2300  piperacillin-tazobactam (ZOSYN) IVPB 3.375 g  Status:  Discontinued        3.375 g 12.5 mL/hr over 240 Minutes Intravenous Every 8 hours 01/28/22 2247 01/30/22 1110   01/28/22 2200  piperacillin-tazobactam (ZOSYN) IVPB 3.375 g  Status:  Discontinued         3.375 g 12.5 mL/hr over 240 Minutes Intravenous Every 8 hours 01/28/22 1603 01/28/22 2323   01/28/22 1800  piperacillin-tazobactam (ZOSYN) IVPB 3.375 g  Status:  Discontinued        3.375 g 12.5 mL/hr over 240 Minutes Intravenous Every 6 hours 01/28/22 1558 01/28/22 1600   01/28/22 1615  piperacillin-tazobactam (ZOSYN) IVPB 2.25 g  Status:  Discontinued        2.25 g 100 mL/hr over 30 Minutes Intravenous Every 8 hours 01/28/22 1600 01/28/22 1603   01/28/22 1615  piperacillin-tazobactam (ZOSYN) IVPB 3.375 g        3.375 g 100 mL/hr over 30 Minutes Intravenous  Once 01/28/22 1603 01/28/22 1712        I have personally reviewed the following labs and images: CBC: Recent Labs  Lab 01/28/22 1008 01/28/22 1337 01/29/22 0459  01/30/22 0555 01/31/22 0602  WBC 14.8* 15.3* 13.5* 10.5 10.0  NEUTROABS 10.9* 11.2*  --   --   --   HGB 9.3* 9.6* 9.2* 9.9* 9.1*  HCT 26.9* 28.1* 27.1* 30.1* 27.6*  MCV 89.1 89.2 89.1 90.9 91.1  PLT 387 392 387 422* 393   BMP &GFR Recent Labs  Lab 01/28/22 1008 01/28/22 1337 01/29/22 0459 01/30/22 0555 01/31/22 0602  NA 121* 122* 125* 130* 129*  K 4.5 4.6 4.2 4.0 4.0  CL 87* 86* 93* 91* 91*  CO2 '23 24 26 29 27  '$ GLUCOSE 240* 169* 80 114* 111*  BUN 52* 52* 46* 43* 46*  CREATININE 1.86* 1.91* 1.71* 1.75* 1.79*  CALCIUM 7.9* 8.2* 8.0* 8.5* 8.2*  MG  --  1.9  --   --  1.5*  PHOS  --   --   --   --  3.8   Estimated Creatinine Clearance: 34.3 mL/min (A) (by C-G formula based on SCr of 1.79 mg/dL (H)). Liver & Pancreas: Recent Labs  Lab 01/28/22 1008 01/28/22 1337 01/31/22 0602  AST 21 22  --   ALT 17 16  --   ALKPHOS 81 81  --   BILITOT 0.7 0.8  --   PROT 6.3* 6.6  --   ALBUMIN 2.4* 2.4* 2.2*   No results for input(s): "LIPASE", "AMYLASE" in the last 168 hours. No results for input(s): "AMMONIA" in the last 168 hours. Diabetic: No results for input(s): "HGBA1C" in the last 72 hours. Recent Labs  Lab 01/30/22 1159 01/30/22 1704  01/31/22 0051 01/31/22 0601 01/31/22 1151  GLUCAP 234* 168* 191* 104* 263*   Cardiac Enzymes: No results for input(s): "CKTOTAL", "CKMB", "CKMBINDEX", "TROPONINI" in the last 168 hours. No results for input(s): "PROBNP" in the last 8760 hours. Coagulation Profile: No results for input(s): "INR", "PROTIME" in the last 168 hours. Thyroid Function Tests: No results for input(s): "TSH", "T4TOTAL", "FREET4", "T3FREE", "THYROIDAB" in the last 72 hours. Lipid Profile: No results for input(s): "CHOL", "HDL", "LDLCALC", "TRIG", "CHOLHDL", "LDLDIRECT" in the last 72 hours. Anemia Panel: No results for input(s): "VITAMINB12", "FOLATE", "FERRITIN", "TIBC", "IRON", "RETICCTPCT" in the last 72 hours.  Urine analysis:    Component Value Date/Time   COLORURINE YELLOW 01/28/2022 1455   APPEARANCEUR HAZY (A) 01/28/2022 1455   LABSPEC 1.010 01/28/2022 1455   PHURINE 5.0 01/28/2022 1455   GLUCOSEU NEGATIVE 01/28/2022 1455   HGBUR MODERATE (A) 01/28/2022 1455   BILIRUBINUR NEGATIVE 01/28/2022 1455   KETONESUR NEGATIVE 01/28/2022 1455   PROTEINUR NEGATIVE 01/28/2022 1455   UROBILINOGEN 0.2 12/26/2008 0950   NITRITE NEGATIVE 01/28/2022 1455   LEUKOCYTESUR LARGE (A) 01/28/2022 1455   Sepsis Labs: Invalid input(s): "PROCALCITONIN", "LACTICIDVEN"  Microbiology: Recent Results (from the past 240 hour(s))  Remove and replace urinary cath (placed > 5 days) then obtain urine culture from new indwelling urinary catheter.     Status: Abnormal   Collection Time: 01/28/22 10:05 PM   Specimen: Urine, Catheterized  Result Value Ref Range Status   Specimen Description   Final    URINE, CATHETERIZED Performed at [facility], [address] [place], [place] [address]    Special Requests   Final    NONE Performed at [facility], [address], [place], [place] [address]    Culture 80,000 COLONIES/mL ESCHERICHIA COLI (A)  Final   Report Status 01/31/2022 FINAL  Final   Organism ID, Bacteria ESCHERICHIA  COLI (A)  Final      Susceptibility  Escherichia coli - MIC*    AMPICILLIN <=2 SENSITIVE Sensitive     CEFAZOLIN <=4 SENSITIVE Sensitive     CEFEPIME <=0.12 SENSITIVE Sensitive     CEFTRIAXONE <=0.25 SENSITIVE Sensitive     CIPROFLOXACIN <=0.25 SENSITIVE Sensitive     GENTAMICIN <=1 SENSITIVE Sensitive     IMIPENEM <=0.25 SENSITIVE Sensitive     NITROFURANTOIN <=16 SENSITIVE Sensitive     TRIMETH/SULFA <=20 SENSITIVE Sensitive     AMPICILLIN/SULBACTAM <=2 SENSITIVE Sensitive     PIP/TAZO <=4 SENSITIVE Sensitive     * 80,000 COLONIES/mL ESCHERICHIA COLI  Culture, blood (Routine X 2) w Reflex to ID Panel     Status: None (Preliminary result)   Collection Time: 01/28/22 10:37 PM   Specimen: Left Antecubital; Blood  Result Value Ref Range Status   Specimen Description LEFT ANTECUBITAL  Final   Special Requests   Final    BOTTLES DRAWN AEROBIC AND ANAEROBIC Blood Culture adequate volume   Culture   Final    NO GROWTH 3 DAYS Performed at [facility], [address], [place], [place] [address]    Report Status PENDING  Incomplete  Culture, blood (Routine X 2) w Reflex to ID Panel     Status: None (Preliminary result)   Collection Time: 01/28/22 10:37 PM   Specimen: BLOOD RIGHT HAND  Result Value Ref Range Status   Specimen Description BLOOD RIGHT HAND  Final   Special Requests   Final    BOTTLES DRAWN AEROBIC AND ANAEROBIC Blood Culture adequate volume   Culture   Final    NO GROWTH 3 DAYS Performed at [facility], [address], [place], [place] [address]    Report Status PENDING  Incomplete  Urine Culture     Status: None   Collection Time: 01/29/22  7:51 AM   Specimen: Urine, Clean Catch  Result Value Ref Range Status   Specimen Description   Final    URINE, CLEAN CATCH Performed at [facility], [address], [place], [place] [address]    Special Requests   Final    NONE Performed at [facility], [address] [place], [place] [address]    Culture   Final    NO  GROWTH Performed at [facility], [place] [address] [place], [place] [address]    Report Status 01/30/2022 FINAL  Final    Radiology Studies: No results found.    Eadie Repetto T. [place]  If 7PM-7AM, please contact night-coverage [email or web address] 01/31/2022, 1:24 PM

## 2022-01-31 NOTE — IPAL (Signed)
  Interdisciplinary Goals of Care Family Meeting   Date carried out: 01/31/2022  Location of the meeting: Bedside  Member's involved: Physician and Family Member or next of kin (wife and grandson)  Hospital doctor or Loss adjuster, chartered: Patient    Discussion: We discussed goals of care for Bradley Sheppard with focus on CODE STATUS including pros and cons of CPR and intubation.  Given his age, comorbidity and debility, I believe the risk outweighs the benefit of CPR and intubation.  I have recommended DNR and DNI.  Patient, wife and grandson who voiced understanding, but likes to discuss with the rest of family members before making decision.  For now he remains full code.  Code status: Full Code  Disposition: Continue current acute care  Time spent for the meeting: 25 minutes    Mercy Riding, MD  01/31/2022, 2:03 PM

## 2022-02-01 DIAGNOSIS — E1122 Type 2 diabetes mellitus with diabetic chronic kidney disease: Secondary | ICD-10-CM | POA: Diagnosis not present

## 2022-02-01 DIAGNOSIS — K6289 Other specified diseases of anus and rectum: Secondary | ICD-10-CM | POA: Diagnosis not present

## 2022-02-01 DIAGNOSIS — E871 Hypo-osmolality and hyponatremia: Secondary | ICD-10-CM | POA: Diagnosis not present

## 2022-02-01 DIAGNOSIS — I350 Nonrheumatic aortic (valve) stenosis: Secondary | ICD-10-CM | POA: Diagnosis not present

## 2022-02-01 LAB — CBC
HCT: 27 % — ABNORMAL LOW (ref 39.0–52.0)
Hemoglobin: 8.9 g/dL — ABNORMAL LOW (ref 13.0–17.0)
MCH: 30 pg (ref 26.0–34.0)
MCHC: 33 g/dL (ref 30.0–36.0)
MCV: 90.9 fL (ref 80.0–100.0)
Platelets: 385 10*3/uL (ref 150–400)
RBC: 2.97 MIL/uL — ABNORMAL LOW (ref 4.22–5.81)
RDW: 13.7 % (ref 11.5–15.5)
WBC: 10.1 10*3/uL (ref 4.0–10.5)
nRBC: 0 % (ref 0.0–0.2)

## 2022-02-01 LAB — RENAL FUNCTION PANEL
Albumin: 2.2 g/dL — ABNORMAL LOW (ref 3.5–5.0)
Anion gap: 8 (ref 5–15)
BUN: 54 mg/dL — ABNORMAL HIGH (ref 8–23)
CO2: 31 mmol/L (ref 22–32)
Calcium: 8.3 mg/dL — ABNORMAL LOW (ref 8.9–10.3)
Chloride: 89 mmol/L — ABNORMAL LOW (ref 98–111)
Creatinine, Ser: 1.71 mg/dL — ABNORMAL HIGH (ref 0.61–1.24)
GFR, Estimated: 39 mL/min — ABNORMAL LOW (ref >60)
Glucose, Bld: 135 mg/dL — ABNORMAL HIGH (ref 70–99)
Phosphorus: 3.5 mg/dL (ref 2.5–4.6)
Potassium: 3.6 mmol/L (ref 3.5–5.1)
Sodium: 128 mmol/L — ABNORMAL LOW (ref 135–145)

## 2022-02-01 LAB — GLUCOSE, CAPILLARY
Glucose-Capillary: 158 mg/dL — ABNORMAL HIGH (ref 70–99)
Glucose-Capillary: 149 mg/dL — ABNORMAL HIGH (ref 70–99)
Glucose-Capillary: 212 mg/dL — ABNORMAL HIGH (ref 70–99)
Glucose-Capillary: 223 mg/dL — ABNORMAL HIGH (ref 70–99)

## 2022-02-01 LAB — BRAIN NATRIURETIC PEPTIDE: B Natriuretic Peptide: 657.7 pg/mL — ABNORMAL HIGH (ref 0.0–100.0)

## 2022-02-01 LAB — MAGNESIUM: Magnesium: 2 mg/dL (ref 1.7–2.4)

## 2022-02-01 MED ORDER — INSULIN GLARGINE-YFGN 100 UNIT/ML ~~LOC~~ SOLN
5.0000 [IU] | Freq: Every day | SUBCUTANEOUS | Status: DC
  Administered 2022-02-01 – 2022-02-04 (×4): 5 [IU] via SUBCUTANEOUS
  Filled 2022-02-01 (×4): qty 0.05

## 2022-02-01 MED ORDER — FUROSEMIDE 10 MG/ML IJ SOLN: 40.0000 mg | Freq: Every day | INTRAMUSCULAR | Status: DC

## 2022-02-01 NOTE — Plan of Care (Signed)
  Problem: Education: Goal: Understanding of discharge needs will improve Outcome: Progressing   Problem: Skin Integrity: Goal: Will show signs of wound healing Outcome: Progressing   Problem: Activity: Goal: Risk for activity intolerance will decrease Outcome: Progressing

## 2022-02-01 NOTE — Plan of Care (Signed)
  Problem: Education: Goal: Understanding of discharge needs will improve Outcome: Progressing Goal: Verbalization of understanding of the causes of altered bowel function will improve Outcome: Progressing   Problem: Activity: Goal: Ability to tolerate increased activity will improve Outcome: Progressing   Problem: Bowel/Gastric: Goal: Gastrointestinal status for postoperative course will improve Outcome: Progressing   Problem: Health Behavior/Discharge Planning: Goal: Identification of community resources to assist with postoperative recovery needs will improve Outcome: Progressing   Problem: Nutritional: Goal: Will attain and maintain optimal nutritional status will improve Outcome: Progressing   Problem: Clinical Measurements: Goal: Postoperative complications will be avoided or minimized Outcome: Progressing   Problem: Respiratory: Goal: Respiratory status will improve Outcome: Progressing   Problem: Skin Integrity: Goal: Will show signs of wound healing Outcome: Progressing   Problem: Education: Goal: Ability to describe self-care measures that may prevent or decrease complications (Diabetes Survival Skills Education) will improve Outcome: Progressing Goal: Individualized Educational Video(s) Outcome: Progressing   Problem: Coping: Goal: Ability to adjust to condition or change in health will improve Outcome: Progressing   Problem: Fluid Volume: Goal: Ability to maintain a balanced intake and output will improve Outcome: Progressing   Problem: Health Behavior/Discharge Planning: Goal: Ability to identify and utilize available resources and services will improve Outcome: Progressing Goal: Ability to manage health-related needs will improve Outcome: Progressing   Problem: Metabolic: Goal: Ability to maintain appropriate glucose levels will improve Outcome: Progressing   Problem: Nutritional: Goal: Maintenance of adequate nutrition will improve Outcome:  Progressing Goal: Progress toward achieving an optimal weight will improve Outcome: Progressing   Problem: Skin Integrity: Goal: Risk for impaired skin integrity will decrease Outcome: Progressing   Problem: Tissue Perfusion: Goal: Adequacy of tissue perfusion will improve Outcome: Progressing   Problem: Education: Goal: Knowledge of General Education information will improve Description: Including pain rating scale, medication(s)/side effects and non-pharmacologic comfort measures Outcome: Progressing   Problem: Health Behavior/Discharge Planning: Goal: Ability to manage health-related needs will improve Outcome: Progressing   Problem: Clinical Measurements: Goal: Ability to maintain clinical measurements within normal limits will improve Outcome: Progressing Goal: Will remain free from infection Outcome: Progressing Goal: Diagnostic test results will improve Outcome: Progressing Goal: Respiratory complications will improve Outcome: Progressing Goal: Cardiovascular complication will be avoided Outcome: Progressing   Problem: Activity: Goal: Risk for activity intolerance will decrease Outcome: Progressing   Problem: Nutrition: Goal: Adequate nutrition will be maintained Outcome: Progressing   Problem: Coping: Goal: Level of anxiety will decrease Outcome: Progressing   Problem: Elimination: Goal: Will not experience complications related to bowel motility Outcome: Progressing Goal: Will not experience complications related to urinary retention Outcome: Progressing   Problem: Pain Managment: Goal: General experience of comfort will improve Outcome: Progressing   Problem: Safety: Goal: Ability to remain free from injury will improve Outcome: Progressing   Problem: Skin Integrity: Goal: Risk for impaired skin integrity will decrease Outcome: Progressing

## 2022-02-01 NOTE — Progress Notes (Signed)
PROGRESS NOTE  Bradley Sheppard NFA:[id number] DOB: 1940/12/14   PCP: [name], MD  Patient is from: SNF  DOA: 01/28/2022 LOS: 4  Chief complaints Chief Complaint  Patient presents with   Abnormal Lab     Brief Narrative / Interim history: 81 year old M with PMH of ION-6E, diastolic CHF, HTN, DM-2, rectal mass status post partial proctectomy by transanal endoscopic microsurgery on 12/31/21, urinary retention with chronic Foley and Hodgkin's lymphoma sent to [facility] ED by his oncologist after he presented there with general weakness, malaise, abdominal pain and burning sensation in the rectal area.  He was hyponatremic to 121.  Leukocytosis to 15.3.  CT abdomen and pelvis showed perivesical stranding raising concern for cystitis and possible rectal perforation.  Patient was started on IV Zosyn.  General surgery consulted and recommended transfer to [facility] for further evaluation and care.  After evaluation, surgical team did not feel there is need for surgical intervention or antibiotic, and recommended sitz bath's, scheduled Tylenol with as needed oxycodone and bowel regimen to avoid constipation.   In regards to hyponatremia, felt to be dilutional in the setting of volume.  Started on IV Lasix with improvement.  In regards to catheter associated UTI, urine culture grew pansensitive E. coli.  Completed antibiotic course on 10/1.  Catheter was exchanged at [facility] on 9/28.  Plan for discharge to SNF once cleared by general surgery, who is intermittently following patient   Subjective: Seen and examined earlier this morning.  No major events overnight of this morning.  Patient was awake and follows sleep after he ate breakfast.  Patient's wife and son at the bedside.    Objective: Vitals:   01/31/22 1319 01/31/22 1947 02/01/22 0428 02/01/22 1330  BP: 120/62 99/76 114/66 (!) 122/58  Pulse: 82 87 83 (!) 52  Resp: '20 16 18 20  '$ Temp: 97.7 F (36.5 C)  98.4 F (36.9 C) 97.9 F (36.6 C) 97.8 F (36.6 C)  TempSrc: Oral Oral Oral Oral  SpO2: 98% 97% 97% 99%  Weight:      Height:        Examination:  GENERAL: No apparent distress.  Nontoxic. HEENT: MMM.  Vision and hearing grossly intact.  NECK: Supple.  No apparent JVD.  RESP:  No IWOB.  Fair aeration bilaterally. CVS:  RRR. Heart sounds normal.  ABD/GI/GU: BS+. Abd soft, NTND.  Foley catheter in place. MSK/EXT:  Moves extremities. No apparent deformity.  Trace BLE edema. SKIN: Known stage III coccygeal pressure ulcer. NEURO: Sleeping.  No apparent focal neuro deficit. PSYCH: Calm. Normal affect.   Procedures:  None  Microbiology summarized: Blood cultures NGTD Urine culture with 80,000 colonies of E. coli.  Assessment and plan: Principal Problem:   Hyponatremia Active Problems:   Moderate-Severe Aortic stenosis   DMII (diabetes mellitus, type 2) (HCC)   Rectal mass   Essential hypertension, benign   MALT lymphoma (HCC)   Acute on chronic diastolic CHF (congestive heart failure) (HCC)   Chronic kidney disease, stage 3b (HCC)   Pressure injury of skin   Urinary retention   Sepsis due to gram-negative UTI (HCC)   Atrial fibrillation (HCC)   Catheter-associated urinary tract infection (HCC)   Physical deconditioning   Goals of care, counseling/discussion  Hypervolemic hyponatremia: Na 121 on admission.  There could be some element of SIADH as well Recent Labs  Lab 01/28/22 1008 01/28/22 1337 01/29/22 0459 01/30/22 0555 01/31/22 0602 02/01/22 0804  NA 121* 122*  125* 130* 129* 128*  -Decrease IV Lasix to 40 mg daily. -Recommend discontinuing Actos on discharge due to risk for fluid retention  Sepsis due to catheter associated E. coli UTI: POA.  Had leukocytosis and tachycardia POA.  Blood cultures NGTD.  Urine culture with pansensitive E. coli.  Catheter exchanged in ED. Sepsis resolved. -Completed 5 days of appropriate antibiotics on 10/1  Acute on chronic  diastolic CHF: TTE in the 01/3789 with LVEF of 65 to 70%, G2 DD, severe LAE, moderate RAE, moderate aortic stenosis and normal RVSP.  Was somewhat hypervolemic with hyponatremia on presentation.  BNP elevated to 527.  Started on IV Lasix.  Net -3.2 L.  Creatinine relatively stable. -Decreased IV Lasix to 40 mg daily.  Takes 40 mg p.o. at home -Continue holding home metolazone -Recommend discontinuing Actos on discharge -Strict intake and output, daily weight, renal functions and electrolytes   Rectal mass s/p resection with transanal endoscopic microsurgery on 12/31/2021: CT abdomen and pelvis raised concern for microperforation with felt to be less likely after evaluation by general surgery. -Surgery recommended  sitz bath's, scheduled Tylenol with as needed oxycodone and bowel regimen -Changed diet from dysphagia 1 to soft after discussion with surgery.  Essential hypertension: Normotensive. -Continue home Coreg -Lasix as above   Uncontrolled NIDDM-2 with hyperglycemia: A1c 7.2% on 12/31/2021. Recent Labs  Lab 01/31/22 1752 01/31/22 2355 02/01/22 0547 02/01/22 0744 02/01/22 1137  GLUCAP 287* 154* 158* 149* 212*  -Continue SSI-sensitive -Add basal 5 units daily -Recommend discontinuing Actos on discharge due to risk for fluid retention -Continue home Crestor  Paroxysmal A-fib: Not on anticoagulation due to history of frequent falls.  On aspirin only.CHADs2VASC score- at least 4.  -Continue home Coreg and low-dose aspirin   CKD stage IIIb: Stable. Recent Labs    01/09/22 0317 01/10/22 0412 01/11/22 0507 01/13/22 0817 01/28/22 1008 01/28/22 1337 01/29/22 0459 01/30/22 0555 01/31/22 0602 02/01/22 0804  BUN 43* 43* 44* 55* 52* 52* 46* 43* 46* 54*  CREATININE 1.59* 1.66* 1.69* 1.91* 1.86* 1.91* 1.71* 1.75* 1.79* 1.71*  -Continue monitoring  History of CAD:  Status post DES to LAD and RCA in 2003.  On aspirin, Coreg, Crestor at home. -Continue home medications   History of  Hodgkin's lymphoma:  -Outpatient follow-up with oncology   Normocytic anemia: H&H stable. Recent Labs    01/10/22 0412 01/11/22 0507 01/12/22 0448 01/13/22 0817 01/28/22 1008 01/28/22 1337 01/29/22 0459 01/30/22 0555 01/31/22 0602 02/01/22 0804  HGB 8.6* 8.5* 9.1* 8.9* 9.3* 9.6* 9.2* 9.9* 9.1* 8.9*  -IV ferric gluconate -Monitor  Urinary retention with indwelling Foley catheter since rectal mass resection on 12/31/2021. -Foley catheter exchanged in ED -Continue home Flomax -Outpatient follow-up with urology for voiding trial  Goal of care counseling: Remains full code. See IPAL note/goal of care discussion on 9/30 -Recommend palliative follow-up at SNF  Physical deconditioning -PT/OT-recommended to return to SNF which I believe is appropriate given his significant deconditioning in the setting of acute hyponatremia, sepsis, acute on chronic diastolic CHF not to mention his coccygeal pressure ulcer and rectal pain and discomfort after rectal mass resection.  Patient's insurance wants to peer review but did not answer the phone call.  SNF placement approved by Madelaine Bhat at [phone] after peer to peer on 9/30.  Body mass index is 25.63 kg/m.   Pressure skin injury: POA Pressure Injury 01/29/22 Coccyx Stage 3 -  Full thickness tissue loss. Subcutaneous fat may be visible but bone, tendon or muscle are  NOT exposed. (Active)  01/29/22   Location: Coccyx  Location Orientation:   Staging: Stage 3 -  Full thickness tissue loss. Subcutaneous fat may be visible but bone, tendon or muscle are NOT exposed.  Wound Description (Comments):   Present on Admission: Yes  Dressing Type Foam - Lift dressing to assess site every shift 01/29/22 1951   DVT prophylaxis:  SCDs Start: 01/28/22 2252  Code Status: Full code Family Communication: Updated patient's wife and son at bedside Level of care: Telemetry Status is: Inpatient Remains inpatient appropriate because: Hyponatremia,  hyponatremia and acute on chronic diastolic CHF   Final disposition: SNF Consultants:  General surgery  Sch Meds:  Scheduled Meds:  carvedilol  12.5 mg Oral BID WC   Chlorhexidine Gluconate Cloth  6 each Topical Q0600   [START ON 02/02/2022] furosemide  40 mg Intravenous Daily   insulin aspart  0-9 Units Subcutaneous Q6H   lip balm   Topical BID   liver oil-zinc oxide   Topical BID   LORazepam  0.5 mg Oral QHS   polycarbophil  625 mg Oral BID   [START ON 02/04/2022] rosuvastatin  5 mg Oral Q Wed   tamsulosin  0.4 mg Oral Daily   Continuous Infusions:   PRN Meds:.acetaminophen **OR** [DISCONTINUED] acetaminophen, alum & mag hydroxide-simeth, magic mouthwash, menthol-cetylpyridinium, morphine injection, ondansetron **OR** ondansetron (ZOFRAN) IV, mouth rinse, oxyCODONE, phenol, polyethylene glycol, simethicone, witch hazel-glycerin  Antimicrobials: Anti-infectives (From admission, onward)    Start     Dose/Rate Route Frequency Ordered Stop   02/01/22 1200  ceFAZolin (ANCEF) IVPB 2g/100 mL premix        2 g 200 mL/hr over 30 Minutes Intravenous  Once 01/31/22 1340 02/01/22 1253   01/30/22 1200  cefTRIAXone (ROCEPHIN) 1 g in sodium chloride 0.9 % 100 mL IVPB  Status:  Discontinued        1 g 200 mL/hr over 30 Minutes Intravenous Every 24 hours 01/30/22 1110 01/31/22 1340   01/28/22 2300  piperacillin-tazobactam (ZOSYN) IVPB 3.375 g  Status:  Discontinued        3.375 g 12.5 mL/hr over 240 Minutes Intravenous Every 8 hours 01/28/22 2247 01/30/22 1110   01/28/22 2200  piperacillin-tazobactam (ZOSYN) IVPB 3.375 g  Status:  Discontinued        3.375 g 12.5 mL/hr over 240 Minutes Intravenous Every 8 hours 01/28/22 1603 01/28/22 2323   01/28/22 1800  piperacillin-tazobactam (ZOSYN) IVPB 3.375 g  Status:  Discontinued        3.375 g 12.5 mL/hr over 240 Minutes Intravenous Every 6 hours 01/28/22 1558 01/28/22 1600   01/28/22 1615  piperacillin-tazobactam (ZOSYN) IVPB 2.25 g  Status:   Discontinued        2.25 g 100 mL/hr over 30 Minutes Intravenous Every 8 hours 01/28/22 1600 01/28/22 1603   01/28/22 1615  piperacillin-tazobactam (ZOSYN) IVPB 3.375 g        3.375 g 100 mL/hr over 30 Minutes Intravenous  Once 01/28/22 1603 01/28/22 1712        I have personally reviewed the following labs and images: CBC: Recent Labs  Lab 01/28/22 1008 01/28/22 1337 01/29/22 0459 01/30/22 0555 01/31/22 0602 02/01/22 0804  WBC 14.8* 15.3* 13.5* 10.5 10.0 10.1  NEUTROABS 10.9* 11.2*  --   --   --   --   HGB 9.3* 9.6* 9.2* 9.9* 9.1* 8.9*  HCT 26.9* 28.1* 27.1* 30.1* 27.6* 27.0*  MCV 89.1 89.2 89.1 90.9 91.1 90.9  PLT 387 392 387 422*  393 385   BMP &GFR Recent Labs  Lab 01/28/22 1337 01/29/22 0459 01/30/22 0555 01/31/22 0602 02/01/22 0804  NA 122* 125* 130* 129* 128*  K 4.6 4.2 4.0 4.0 3.6  CL 86* 93* 91* 91* 89*  CO2 '24 26 29 27 31  '$ GLUCOSE 169* 80 114* 111* 135*  BUN 52* 46* 43* 46* 54*  CREATININE 1.91* 1.71* 1.75* 1.79* 1.71*  CALCIUM 8.2* 8.0* 8.5* 8.2* 8.3*  MG 1.9  --   --  1.5* 2.0  PHOS  --   --   --  3.8 3.5   Estimated Creatinine Clearance: 35.9 mL/min (A) (by C-G formula based on SCr of 1.71 mg/dL (H)). Liver & Pancreas: Recent Labs  Lab 01/28/22 1008 01/28/22 1337 01/31/22 0602 02/01/22 0804  AST 21 22  --   --   ALT 17 16  --   --   ALKPHOS 81 81  --   --   BILITOT 0.7 0.8  --   --   PROT 6.3* 6.6  --   --   ALBUMIN 2.4* 2.4* 2.2* 2.2*   No results for input(s): "LIPASE", "AMYLASE" in the last 168 hours. No results for input(s): "AMMONIA" in the last 168 hours. Diabetic: No results for input(s): "HGBA1C" in the last 72 hours. Recent Labs  Lab 01/31/22 1752 01/31/22 2355 02/01/22 0547 02/01/22 0744 02/01/22 1137  GLUCAP 287* 154* 158* 149* 212*   Cardiac Enzymes: No results for input(s): "CKTOTAL", "CKMB", "CKMBINDEX", "TROPONINI" in the last 168 hours. No results for input(s): "PROBNP" in the last 8760 hours. Coagulation  Profile: No results for input(s): "INR", "PROTIME" in the last 168 hours. Thyroid Function Tests: No results for input(s): "TSH", "T4TOTAL", "FREET4", "T3FREE", "THYROIDAB" in the last 72 hours. Lipid Profile: No results for input(s): "CHOL", "HDL", "LDLCALC", "TRIG", "CHOLHDL", "LDLDIRECT" in the last 72 hours. Anemia Panel: No results for input(s): "VITAMINB12", "FOLATE", "FERRITIN", "TIBC", "IRON", "RETICCTPCT" in the last 72 hours.  Urine analysis:    Component Value Date/Time   COLORURINE YELLOW 01/28/2022 1455   APPEARANCEUR HAZY (A) 01/28/2022 1455   LABSPEC 1.010 01/28/2022 1455   PHURINE 5.0 01/28/2022 1455   GLUCOSEU NEGATIVE 01/28/2022 1455   HGBUR MODERATE (A) 01/28/2022 1455   BILIRUBINUR NEGATIVE 01/28/2022 1455   KETONESUR NEGATIVE 01/28/2022 1455   PROTEINUR NEGATIVE 01/28/2022 1455   UROBILINOGEN 0.2 12/26/2008 0950   NITRITE NEGATIVE 01/28/2022 1455   LEUKOCYTESUR LARGE (A) 01/28/2022 1455   Sepsis Labs: Invalid input(s): "PROCALCITONIN", "LACTICIDVEN"  Microbiology: Recent Results (from the past 240 hour(s))  Remove and replace urinary cath (placed > 5 days) then obtain urine culture from new indwelling urinary catheter.     Status: Abnormal   Collection Time: 01/28/22 10:05 PM   Specimen: Urine, Catheterized  Result Value Ref Range Status   Specimen Description   Final    URINE, CATHETERIZED Performed at [facility], [address] [place], [place] [address]    Special Requests   Final    NONE Performed at [facility], [address], [place], [place] [address]    Culture 80,000 COLONIES/mL ESCHERICHIA COLI (A)  Final   Report Status 01/31/2022 FINAL  Final   Organism ID, Bacteria ESCHERICHIA COLI (A)  Final      Susceptibility   Escherichia coli - MIC*    AMPICILLIN <=2 SENSITIVE Sensitive     CEFAZOLIN <=4 SENSITIVE Sensitive     CEFEPIME <=0.12 SENSITIVE Sensitive     CEFTRIAXONE <=0.25 SENSITIVE Sensitive  CIPROFLOXACIN <=0.25 SENSITIVE  Sensitive     GENTAMICIN <=1 SENSITIVE Sensitive     IMIPENEM <=0.25 SENSITIVE Sensitive     NITROFURANTOIN <=16 SENSITIVE Sensitive     TRIMETH/SULFA <=20 SENSITIVE Sensitive     AMPICILLIN/SULBACTAM <=2 SENSITIVE Sensitive     PIP/TAZO <=4 SENSITIVE Sensitive     * 80,000 COLONIES/mL ESCHERICHIA COLI  Culture, blood (Routine X 2) w Reflex to ID Panel     Status: None (Preliminary result)   Collection Time: 01/28/22 10:37 PM   Specimen: Left Antecubital; Blood  Result Value Ref Range Status   Specimen Description LEFT ANTECUBITAL  Final   Special Requests   Final    BOTTLES DRAWN AEROBIC AND ANAEROBIC Blood Culture adequate volume   Culture   Final    NO GROWTH 4 DAYS Performed at [facility], [address] [place], [place] [address]    Report Status PENDING  Incomplete  Culture, blood (Routine X 2) w Reflex to ID Panel     Status: None (Preliminary result)   Collection Time: 01/28/22 10:37 PM   Specimen: BLOOD RIGHT HAND  Result Value Ref Range Status   Specimen Description BLOOD RIGHT HAND  Final   Special Requests   Final    BOTTLES DRAWN AEROBIC AND ANAEROBIC Blood Culture adequate volume   Culture   Final    NO GROWTH 4 DAYS Performed at [facility], [address], [place], [place] [address]    Report Status PENDING  Incomplete  Urine Culture     Status: None   Collection Time: 01/29/22  7:51 AM   Specimen: Urine, Clean Catch  Result Value Ref Range Status   Specimen Description   Final    URINE, CLEAN CATCH Performed at [facility], [address], [place], [place] [address]    Special Requests   Final    NONE Performed at [facility], [address], [place], [place] [address]    Culture   Final    NO GROWTH Performed at [facility], [place] [address], [place], [place] [address]    Report Status 01/30/2022 FINAL  Final    Radiology Studies: No results found.    Iness Pangilinan T. [place]  If 7PM-7AM, please contact  night-coverage [email or web address] 02/01/2022, 3:35 PM

## 2022-02-02 DIAGNOSIS — E871 Hypo-osmolality and hyponatremia: Secondary | ICD-10-CM | POA: Diagnosis not present

## 2022-02-02 DIAGNOSIS — K6289 Other specified diseases of anus and rectum: Secondary | ICD-10-CM | POA: Diagnosis not present

## 2022-02-02 DIAGNOSIS — E1122 Type 2 diabetes mellitus with diabetic chronic kidney disease: Secondary | ICD-10-CM | POA: Diagnosis not present

## 2022-02-02 DIAGNOSIS — I350 Nonrheumatic aortic (valve) stenosis: Secondary | ICD-10-CM | POA: Diagnosis not present

## 2022-02-02 LAB — BRAIN NATRIURETIC PEPTIDE: B Natriuretic Peptide: 641.1 pg/mL — ABNORMAL HIGH (ref 0.0–100.0)

## 2022-02-02 LAB — RENAL FUNCTION PANEL
Albumin: 2.1 g/dL — ABNORMAL LOW (ref 3.5–5.0)
Anion gap: 6 (ref 5–15)
BUN: 53 mg/dL — ABNORMAL HIGH (ref 8–23)
CO2: 31 mmol/L (ref 22–32)
Calcium: 8 mg/dL — ABNORMAL LOW (ref 8.9–10.3)
Chloride: 90 mmol/L — ABNORMAL LOW (ref 98–111)
Creatinine, Ser: 1.9 mg/dL — ABNORMAL HIGH (ref 0.61–1.24)
GFR, Estimated: 35 mL/min — ABNORMAL LOW (ref >60)
Glucose, Bld: 123 mg/dL — ABNORMAL HIGH (ref 70–99)
Phosphorus: 3.5 mg/dL (ref 2.5–4.6)
Potassium: 3.7 mmol/L (ref 3.5–5.1)
Sodium: 127 mmol/L — ABNORMAL LOW (ref 135–145)

## 2022-02-02 LAB — CBC
HCT: 27.8 % — ABNORMAL LOW (ref 39.0–52.0)
Hemoglobin: 9.3 g/dL — ABNORMAL LOW (ref 13.0–17.0)
MCH: 30.4 pg (ref 26.0–34.0)
MCHC: 33.5 g/dL (ref 30.0–36.0)
MCV: 90.8 fL (ref 80.0–100.0)
Platelets: 382 10*3/uL (ref 150–400)
RBC: 3.06 MIL/uL — ABNORMAL LOW (ref 4.22–5.81)
RDW: 13.7 % (ref 11.5–15.5)
WBC: 8.5 10*3/uL (ref 4.0–10.5)
nRBC: 0 % (ref 0.0–0.2)

## 2022-02-02 LAB — CULTURE, BLOOD (ROUTINE X 2)
Culture: NO GROWTH
Culture: NO GROWTH
Special Requests: ADEQUATE
Special Requests: ADEQUATE

## 2022-02-02 LAB — GLUCOSE, CAPILLARY
Glucose-Capillary: 123 mg/dL — ABNORMAL HIGH (ref 70–99)
Glucose-Capillary: 193 mg/dL — ABNORMAL HIGH (ref 70–99)
Glucose-Capillary: 216 mg/dL — ABNORMAL HIGH (ref 70–99)
Glucose-Capillary: 271 mg/dL — ABNORMAL HIGH (ref 70–99)

## 2022-02-02 LAB — MAGNESIUM: Magnesium: 1.9 mg/dL (ref 1.7–2.4)

## 2022-02-02 MED ORDER — ENSURE SURGERY PO LIQD
237.0000 mL | Freq: Two times a day (BID) | ORAL | Status: DC
  Administered 2022-02-02 – 2022-02-04 (×5): 237 mL via ORAL
  Filled 2022-02-02 (×5): qty 237

## 2022-02-02 MED ORDER — TORSEMIDE 20 MG PO TABS
40.0000 mg | ORAL_TABLET | Freq: Every day | ORAL | Status: DC
  Administered 2022-02-02: 40 mg via ORAL
  Filled 2022-02-02 (×2): qty 2

## 2022-02-02 NOTE — Care Management Important Message (Signed)
Important Message  Patient Details IM Letter placed in Patients room. Name: Bradley Sheppard MRN: [id number] Date of Birth: 11/04/1940   Medicare Important Message Given:  Yes     Kerin Salen 02/02/2022, 12:10 PM

## 2022-02-02 NOTE — Progress Notes (Signed)
Physical Therapy Treatment Patient Details Name: Bradley Sheppard MRN: [id number] DOB: 08-09-1940 Today's Date: 02/02/2022   History of Present Illness Pt is an 81 y.o. male with past medical history relevant for DM 2, HTN, HLD,MALT lymphoma with chronic anemia, CAD , diastolic dysfunction CHF, aortic stenosis and status post partial proctectomy by TEM (transanal endoscopic microsurgery) on 12/31/2021 and admitted 01/28/22 for hyponatremia    PT Comments    Patient progressing gradually with mobility and able to transfer bed>chair this date with 2+ Mod assist using RW. Assist needed to initiate and complete power up to stand from EOB 2x and cues to tuck hips under for improved posture in standing. Mod assist to weight shift Rt/Lt for lateral steps and to guide RW direction. Pt's HR elevated to 140's bpm max after transfer and further gait deferred. Will continue to progress as able. Recommend ST rehab at SNF.    Recommendations for follow up therapy are one component of a multi-disciplinary discharge planning process, led by the attending physician.  Recommendations may be updated based on patient status, additional functional criteria and insurance authorization.  Follow Up Recommendations  Skilled nursing-short term rehab (<3 hours/day) Can patient physically be transported by private vehicle: No   Assistance Recommended at Discharge Frequent or constant Supervision/Assistance  Patient can return home with the following Assistance with cooking/housework;Assist for transportation;Help with stairs or ramp for entrance;A lot of help with walking and/or transfers;A lot of help with bathing/dressing/bathroom   Equipment Recommendations  None recommended by PT    Recommendations for Other Services       Precautions / Restrictions Precautions Precautions: Fall Restrictions Weight Bearing Restrictions: No     Mobility  Bed Mobility Overal bed mobility: Needs Assistance Bed Mobility:  Supine to Sit     Supine to sit: Mod assist, HOB elevated     General bed mobility comments: multimodal cues to initiate bringing LE's off EOB and to fully raise trunk upright. bed pad used to scoot hips fully to edge and place feet on floor.    Transfers Overall transfer level: Needs assistance Equipment used: Rolling walker (2 wheels) Transfers: Sit to/from Stand Sit to Stand: Mod assist, From elevated surface, +2 safety/equipment   Step pivot transfers: Mod assist, +2 safety/equipment       General transfer comment: EOB slightly elevated, mod assist to initiate and rise, cues to acitvate gluteals to facilitate upright standing., pt stood 2x from EOB. lateral steps at EOB and Mod Assist with RW and 2+ for safety to step from EOB to recliner.    Ambulation/Gait                   Stairs             Wheelchair Mobility    Modified Rankin (Stroke Patients Only)       Balance Overall balance assessment: Needs assistance Sitting-balance support: No upper extremity supported, Feet supported Sitting balance-Leahy Scale: Fair     Standing balance support: Bilateral upper extremity supported Standing balance-Leahy Scale: Poor Standing balance comment: reliant on RW and external support                            Cognition Arousal/Alertness: Awake/alert Behavior During Therapy: WFL for tasks assessed/performed Overall Cognitive Status: Within Functional Limits for tasks assessed  General Comments: pleasant, HOH        Exercises      General Comments        Pertinent Vitals/Pain Pain Assessment Pain Assessment: Faces Faces Pain Scale: Hurts a little bit Pain Location: periarea Pain Descriptors / Indicators: Sore Pain Intervention(s): Limited activity within patient's tolerance, Monitored during session, Repositioned    Home Living                          Prior Function             PT Goals (current goals can now be found in the care plan section) Acute Rehab PT Goals Patient Stated Goal: return home PT Goal Formulation: With patient Time For Goal Achievement: 02/13/22 Potential to Achieve Goals: Good Progress towards PT goals: Progressing toward goals    Frequency    Min 2X/week      PT Plan Current plan remains appropriate    Co-evaluation              AM-PAC PT "6 Clicks" Mobility   Outcome Measure  Help needed turning from your back to your side while in a flat bed without using bedrails?: A Lot Help needed moving from lying on your back to sitting on the side of a flat bed without using bedrails?: A Lot Help needed moving to and from a bed to a chair (including a wheelchair)?: A Lot Help needed standing up from a chair using your arms (e.g., wheelchair or bedside chair)?: A Lot Help needed to walk in hospital room?: A Lot Help needed climbing 3-5 steps with a railing? : Total 6 Click Score: 11    End of Session Equipment Utilized During Treatment: Gait belt Activity Tolerance: Patient tolerated treatment well Patient left: in chair;with call bell/phone within reach;with chair alarm set;with family/visitor present Nurse Communication: Mobility status PT Visit Diagnosis: Unsteadiness on feet (R26.81);Muscle weakness (generalized) (M62.81);Difficulty in walking, not elsewhere classified (R26.2)     Time: 1638-4665 PT Time Calculation (min) (ACUTE ONLY): 25 min  Charges:  $Therapeutic Activity: 23-37 mins                     Verner Mould, DPT Acute Rehabilitation Services Office [phone]  02/02/22 3:54 PM

## 2022-02-02 NOTE — Progress Notes (Signed)
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  He was hyponatremic to 121.  Leukocytosis to 15.3.  CT abdomen and pelvis showed perivesical stranding raising concern for cystitis and possible rectal perforation.  Patient was started on IV Zosyn.  General surgery consulted and recommended transfer to [facility] for further evaluation and care.  After evaluation, surgical team did not feel there is need for surgical intervention or antibiotic, and recommended sitz bath's, scheduled Tylenol with as needed oxycodone and bowel regimen to avoid constipation.   In regards to hyponatremia, felt to be dilutional in the setting of volume.  Started on IV Lasix and improved to 130 but started declining slowly.  Fluid restriction started possible SIADH.  In regards to catheter associated UTI, urine culture grew pansensitive E. coli.  Completed antibiotic course on 10/1.  Catheter was exchanged at [facility] on 9/28.  Plan for discharge to SNF once cleared by general surgery, who is intermittently following patient   Subjective: Seen and examined earlier this morning.  No major events overnight of this morning.  He is a sleepy but wakes to voice.  He reports some pain in his bottom.  No other complaints.  He denies chest pain, dyspnea, nausea or vomiting.  Tolerating soft diet.  Objective: Vitals:   02/01/22 1330 02/01/22  2014 02/02/22 0616 02/02/22 1519  BP: (!) 122/58 120/63 126/65 102/66  Pulse: (!) 52 82 (!) 102 (!) 55  Resp: '20 18 17 17  '$ Temp: 97.8 F (36.6 C) 97.7 F (36.5 C) 98 F (36.7 C) 98.3 F (36.8 C)  TempSrc: Oral Oral  Oral  SpO2: 99% 99% 96% 100%  Weight:      Height:        Examination:  GENERAL: No apparent distress.  Nontoxic. HEENT: MMM.  Vision and hearing grossly intact.  NECK: Supple.  No apparent JVD.  RESP:  No IWOB.  Fair aeration bilaterally. CVS:  RRR. Heart sounds normal.  ABD/GI/GU: BS+. Abd soft, NTND.  Foley catheter in place. MSK/EXT:  Moves extremities. No apparent deformity.  Trace lateral pedal edema SKIN: Known stage III coccygeal pressure ulcer. NEURO: Awake and alert. Oriented appropriately.  No apparent focal neuro deficit. PSYCH: Calm. Normal affect.   Procedures:  None  Microbiology summarized: Blood cultures NGTD Urine culture with 80,000 colonies of E. coli.  Assessment and plan: Principal Problem:   Hyponatremia Active Problems:   Moderate-Severe Aortic stenosis   DMII (diabetes mellitus, type 2) (HCC)   Rectal mass   Essential hypertension, benign   MALT lymphoma (HCC)   Acute on chronic diastolic CHF (congestive heart failure) (HCC)   Chronic kidney disease, stage 3b (HCC)   Pressure injury of skin   Urinary retention   Sepsis due to gram-negative UTI (HCC)   Atrial fibrillation (HCC)   Catheter-associated urinary tract infection (HCC)   Physical deconditioning   Goals of care, counseling/discussion  Hypervolemic hyponatremia: Na 121  on admission.  There could be some element of SIADH as well Recent Labs  Lab 01/28/22 1008 01/28/22 1337 01/29/22 0459 01/30/22 0555 01/31/22 0602 02/01/22 0804 02/02/22 0445  NA 121* 122* 125* 130* 129* 128* 127*  -Continue IV Lasix 40 mg daily -Start fluid restriction to 1200 cc a day -Recommend discontinuing Actos on discharge due to risk for fluid retention  Sepsis due to catheter  associated E. coli UTI: POA.  Had leukocytosis and tachycardia POA.  Blood cultures NGTD.  Urine culture with pansensitive E. coli.  Catheter exchanged in ED. Sepsis resolved. -Completed 5 days of appropriate antibiotics on 10/1  Acute on chronic diastolic CHF: TTE in the 06/8001 with LVEF of 65 to 70%, G2 DD, severe LAE, moderate RAE, moderate aortic stenosis and normal RVSP.  Was somewhat hypervolemic with hyponatremia on presentation.  BNP elevated to 527.  Started on IV Lasix.  Net -5 L.  Cr slightly up. -Continue IV Lasix 40 mg daily -Continue holding home metolazone -Recommend discontinuing Actos on discharge -Fluid restriction to 1200 cc in the setting of possible SIADH -Strict intake and output, daily weight, renal functions and electrolytes   Rectal mass s/p resection with transanal endoscopic microsurgery on 12/31/2021: CT abdomen and pelvis raised concern for microperforation with felt to be less likely after evaluation by general surgery. -Surgery recommended  sitz bath's, scheduled Tylenol with as needed oxycodone and bowel regimen -Changed diet from dysphagia 1 to soft after discussion with surgery on 10/1.  Essential hypertension: Normotensive. -Continue home Coreg -Lasix as above   Uncontrolled NIDDM-2 with hyperglycemia: A1c 7.2% on 12/31/2021. Recent Labs  Lab 02/01/22 1137 02/01/22 1648 02/02/22 0014 02/02/22 0517 02/02/22 1214  GLUCAP 212* 223* 216* 123* 193*  -Continue SSI-sensitive -Continue Semglee 5 units daily -Recommend discontinuing Actos on discharge due to risk for fluid retention -Continue home Crestor  Paroxysmal A-fib: Not on anticoagulation due to history of frequent falls.  On aspirin only.CHADs2VASC score- at least 4.  -Continue home Coreg and low-dose aspirin   CKD stage IIIb: Stable. Recent Labs    01/10/22 0412 01/11/22 0507 01/13/22 0817 01/28/22 1008 01/28/22 1337 01/29/22 0459 01/30/22 0555 01/31/22 0602 02/01/22 0804 02/02/22 0445   BUN 43* 44* 55* 52* 52* 46* 43* 46* 54* 53*  CREATININE 1.66* 1.69* 1.91* 1.86* 1.91* 1.71* 1.75* 1.79* 1.71* 1.90*  -Continue monitoring  History of CAD:  Status post DES to LAD and RCA in 2003.  On aspirin, Coreg, Crestor at home. -Continue home medications   History of Hodgkin's lymphoma:  -Outpatient follow-up with oncology   Normocytic anemia: H&H stable. Recent Labs    01/11/22 0507 01/12/22 0448 01/13/22 0817 01/28/22 1008 01/28/22 1337 01/29/22 0459 01/30/22 0555 01/31/22 0602 02/01/22 0804 02/02/22 0445  HGB 8.5* 9.1* 8.9* 9.3* 9.6* 9.2* 9.9* 9.1* 8.9* 9.3*  -Received IV ferric gluconate on 8/28 and 9/29. -Monitor  Urinary retention with indwelling Foley catheter since rectal mass resection on 12/31/2021. -Foley catheter exchanged in ED -Continue home Flomax -Outpatient follow-up with urology for voiding trial  Goal of care counseling: Remains full code. See IPAL note/goal of care discussion on 9/30 -Recommend palliative follow-up at SNF  Physical deconditioning -PT/OT-recommended to return to SNF which I believe is appropriate given his significant deconditioning in the setting of acute hyponatremia, sepsis, acute on chronic diastolic CHF not to mention his coccygeal pressure ulcer and rectal pain and discomfort after rectal mass resection.  Patient's insurance wants to peer review but did not answer the phone  call.  SNF placement approved by Madelaine Bhat at [phone] after peer to peer on 9/30.  Body mass index is 25.63 kg/m.   Pressure skin injury: POA Pressure Injury 01/29/22 Coccyx Stage 3 -  Full thickness tissue loss. Subcutaneous fat may be visible but bone, tendon or muscle are NOT exposed. (Active)  01/29/22   Location: Coccyx  Location Orientation:   Staging: Stage 3 -  Full thickness tissue loss. Subcutaneous fat may be visible but bone, tendon or muscle are NOT exposed.  Wound Description (Comments):   Present on Admission: Yes  Dressing Type  Foam - Lift dressing to assess site every shift 01/29/22 1951   DVT prophylaxis:  SCDs Start: 01/28/22 2252  Code Status: Full code Family Communication: None at bedside. Level of care: Telemetry Status is: Inpatient Remains inpatient appropriate because:  hyponatremia and acute on chronic diastolic CHF   Final disposition: SNF in the next 24 to 48 hours Consultants:  General surgery  Sch Meds:  Scheduled Meds:  carvedilol  12.5 mg Oral BID WC   Chlorhexidine Gluconate Cloth  6 each Topical Q0600   feeding supplement  237 mL Oral BID BM   insulin aspart  0-9 Units Subcutaneous Q6H   insulin glargine-yfgn  5 Units Subcutaneous Daily   lip balm   Topical BID   liver oil-zinc oxide   Topical BID   LORazepam  0.5 mg Oral QHS   polycarbophil  625 mg Oral BID   [START ON 02/04/2022] rosuvastatin  5 mg Oral Q Wed   tamsulosin  0.4 mg Oral Daily   torsemide  40 mg Oral Daily   Continuous Infusions:   PRN Meds:.acetaminophen **OR** [DISCONTINUED] acetaminophen, alum & mag hydroxide-simeth, magic mouthwash, menthol-cetylpyridinium, morphine injection, ondansetron **OR** ondansetron (ZOFRAN) IV, mouth rinse, oxyCODONE, phenol, polyethylene glycol, simethicone, witch hazel-glycerin  Antimicrobials: Anti-infectives (From admission, onward)    Start     Dose/Rate Route Frequency Ordered Stop   02/01/22 1200  ceFAZolin (ANCEF) IVPB 2g/100 mL premix        2 g 200 mL/hr over 30 Minutes Intravenous  Once 01/31/22 1340 02/01/22 1253   01/30/22 1200  cefTRIAXone (ROCEPHIN) 1 g in sodium chloride 0.9 % 100 mL IVPB  Status:  Discontinued        1 g 200 mL/hr over 30 Minutes Intravenous Every 24 hours 01/30/22 1110 01/31/22 1340   01/28/22 2300  piperacillin-tazobactam (ZOSYN) IVPB 3.375 g  Status:  Discontinued        3.375 g 12.5 mL/hr over 240 Minutes Intravenous Every 8 hours 01/28/22 2247 01/30/22 1110   01/28/22 2200  piperacillin-tazobactam (ZOSYN) IVPB 3.375 g  Status:  Discontinued         3.375 g 12.5 mL/hr over 240 Minutes Intravenous Every 8 hours 01/28/22 1603 01/28/22 2323   01/28/22 1800  piperacillin-tazobactam (ZOSYN) IVPB 3.375 g  Status:  Discontinued        3.375 g 12.5 mL/hr over 240 Minutes Intravenous Every 6 hours 01/28/22 1558 01/28/22 1600   01/28/22 1615  piperacillin-tazobactam (ZOSYN) IVPB 2.25 g  Status:  Discontinued        2.25 g 100 mL/hr over 30 Minutes Intravenous Every 8 hours 01/28/22 1600 01/28/22 1603   01/28/22 1615  piperacillin-tazobactam (ZOSYN) IVPB 3.375 g        3.375 g 100 mL/hr over 30 Minutes Intravenous  Once 01/28/22 1603 01/28/22 1712        I have personally reviewed the following labs and images:  CBC: Recent Labs  Lab 01/28/22 1008 01/28/22 1337 01/29/22 0459 01/30/22 0555 01/31/22 0602 02/01/22 0804 02/02/22 0445  WBC 14.8* 15.3* 13.5* 10.5 10.0 10.1 8.5  NEUTROABS 10.9* 11.2*  --   --   --   --   --   HGB 9.3* 9.6* 9.2* 9.9* 9.1* 8.9* 9.3*  HCT 26.9* 28.1* 27.1* 30.1* 27.6* 27.0* 27.8*  MCV 89.1 89.2 89.1 90.9 91.1 90.9 90.8  PLT 387 392 387 422* 393 385 382   BMP &GFR Recent Labs  Lab 01/28/22 1337 01/29/22 0459 01/30/22 0555 01/31/22 0602 02/01/22 0804 02/02/22 0445  NA 122* 125* 130* 129* 128* 127*  K 4.6 4.2 4.0 4.0 3.6 3.7  CL 86* 93* 91* 91* 89* 90*  CO2 '24 26 29 27 31 31  '$ GLUCOSE 169* 80 114* 111* 135* 123*  BUN 52* 46* 43* 46* 54* 53*  CREATININE 1.91* 1.71* 1.75* 1.79* 1.71* 1.90*  CALCIUM 8.2* 8.0* 8.5* 8.2* 8.3* 8.0*  MG 1.9  --   --  1.5* 2.0 1.9  PHOS  --   --   --  3.8 3.5 3.5   Estimated Creatinine Clearance: 32.3 mL/min (A) (by C-G formula based on SCr of 1.9 mg/dL (H)). Liver & Pancreas: Recent Labs  Lab 01/28/22 1008 01/28/22 1337 01/31/22 0602 02/01/22 0804 02/02/22 0445  AST 21 22  --   --   --   ALT 17 16  --   --   --   ALKPHOS 81 81  --   --   --   BILITOT 0.7 0.8  --   --   --   PROT 6.3* 6.6  --   --   --   ALBUMIN 2.4* 2.4* 2.2* 2.2* 2.1*   No results  for input(s): "LIPASE", "AMYLASE" in the last 168 hours. No results for input(s): "AMMONIA" in the last 168 hours. Diabetic: No results for input(s): "HGBA1C" in the last 72 hours. Recent Labs  Lab 02/01/22 1137 02/01/22 1648 02/02/22 0014 02/02/22 0517 02/02/22 1214  GLUCAP 212* 223* 216* 123* 193*   Cardiac Enzymes: No results for input(s): "CKTOTAL", "CKMB", "CKMBINDEX", "TROPONINI" in the last 168 hours. No results for input(s): "PROBNP" in the last 8760 hours. Coagulation Profile: No results for input(s): "INR", "PROTIME" in the last 168 hours. Thyroid Function Tests: No results for input(s): "TSH", "T4TOTAL", "FREET4", "T3FREE", "THYROIDAB" in the last 72 hours. Lipid Profile: No results for input(s): "CHOL", "HDL", "LDLCALC", "TRIG", "CHOLHDL", "LDLDIRECT" in the last 72 hours. Anemia Panel: No results for input(s): "VITAMINB12", "FOLATE", "FERRITIN", "TIBC", "IRON", "RETICCTPCT" in the last 72 hours.  Urine analysis:    Component Value Date/Time   COLORURINE YELLOW 01/28/2022 1455   APPEARANCEUR HAZY (A) 01/28/2022 1455   LABSPEC 1.010 01/28/2022 1455   PHURINE 5.0 01/28/2022 1455   GLUCOSEU NEGATIVE 01/28/2022 1455   HGBUR MODERATE (A) 01/28/2022 1455   BILIRUBINUR NEGATIVE 01/28/2022 1455   KETONESUR NEGATIVE 01/28/2022 1455   PROTEINUR NEGATIVE 01/28/2022 1455   UROBILINOGEN 0.2 12/26/2008 0950   NITRITE NEGATIVE 01/28/2022 1455   LEUKOCYTESUR LARGE (A) 01/28/2022 1455   Sepsis Labs: Invalid input(s): "PROCALCITONIN", "LACTICIDVEN"  Microbiology: Recent Results (from the past 240 hour(s))  Remove and replace urinary cath (placed > 5 days) then obtain urine culture from new indwelling urinary catheter.     Status: Abnormal   Collection Time: 01/28/22 10:05 PM   Specimen: Urine, Catheterized  Result Value Ref Range Status   Specimen Description   Final  URINE, CATHETERIZED Performed at [facility], [address], [place], [place] [address]    Special  Requests   Final    NONE Performed at [facility], [address] [place], [place] [address]    Culture 80,000 COLONIES/mL ESCHERICHIA COLI (A)  Final   Report Status 01/31/2022 FINAL  Final   Organism ID, Bacteria ESCHERICHIA COLI (A)  Final      Susceptibility   Escherichia coli - MIC*    AMPICILLIN <=2 SENSITIVE Sensitive     CEFAZOLIN <=4 SENSITIVE Sensitive     CEFEPIME <=0.12 SENSITIVE Sensitive     CEFTRIAXONE <=0.25 SENSITIVE Sensitive     CIPROFLOXACIN <=0.25 SENSITIVE Sensitive     GENTAMICIN <=1 SENSITIVE Sensitive     IMIPENEM <=0.25 SENSITIVE Sensitive     NITROFURANTOIN <=16 SENSITIVE Sensitive     TRIMETH/SULFA <=20 SENSITIVE Sensitive     AMPICILLIN/SULBACTAM <=2 SENSITIVE Sensitive     PIP/TAZO <=4 SENSITIVE Sensitive     * 80,000 COLONIES/mL ESCHERICHIA COLI  Culture, blood (Routine X 2) w Reflex to ID Panel     Status: None   Collection Time: 01/28/22 10:37 PM   Specimen: Left Antecubital; Blood  Result Value Ref Range Status   Specimen Description LEFT ANTECUBITAL  Final   Special Requests   Final    BOTTLES DRAWN AEROBIC AND ANAEROBIC Blood Culture adequate volume   Culture   Final    NO GROWTH 5 DAYS Performed at [facility], [address], [place], [place] [address]    Report Status 02/02/2022 FINAL  Final  Culture, blood (Routine X 2) w Reflex to ID Panel     Status: None   Collection Time: 01/28/22 10:37 PM   Specimen: BLOOD RIGHT HAND  Result Value Ref Range Status   Specimen Description BLOOD RIGHT HAND  Final   Special Requests   Final    BOTTLES DRAWN AEROBIC AND ANAEROBIC Blood Culture adequate volume   Culture   Final    NO GROWTH 5 DAYS Performed at [facility], [address] [place], [place] [address]    Report Status 02/02/2022 FINAL  Final  Urine Culture     Status: None   Collection Time: 01/29/22  7:51 AM   Specimen: Urine, Clean Catch  Result Value Ref Range Status   Specimen Description   Final    URINE, CLEAN  CATCH Performed at [facility], [address] [place], [place] [address]    Special Requests   Final    NONE Performed at [facility], [address], [place], [place] [address]    Culture   Final    NO GROWTH Performed at [facility], [place] [address], [place], [place] [address]    Report Status 01/30/2022 FINAL  Final    Radiology Studies: No results found.    Shatyra Becka T. [place]  If 7PM-7AM, please contact night-coverage [email or web address] 02/02/2022, 3:42 PM

## 2022-02-03 DIAGNOSIS — Z66 Do not resuscitate: Secondary | ICD-10-CM

## 2022-02-03 DIAGNOSIS — I350 Nonrheumatic aortic (valve) stenosis: Secondary | ICD-10-CM | POA: Diagnosis not present

## 2022-02-03 DIAGNOSIS — E871 Hypo-osmolality and hyponatremia: Secondary | ICD-10-CM | POA: Diagnosis not present

## 2022-02-03 DIAGNOSIS — K6289 Other specified diseases of anus and rectum: Secondary | ICD-10-CM | POA: Diagnosis not present

## 2022-02-03 DIAGNOSIS — E1122 Type 2 diabetes mellitus with diabetic chronic kidney disease: Secondary | ICD-10-CM | POA: Diagnosis not present

## 2022-02-03 LAB — CBC
HCT: 30.4 % — ABNORMAL LOW (ref 39.0–52.0)
Hemoglobin: 10.1 g/dL — ABNORMAL LOW (ref 13.0–17.0)
MCH: 30.2 pg (ref 26.0–34.0)
MCHC: 33.2 g/dL (ref 30.0–36.0)
MCV: 91 fL (ref 80.0–100.0)
Platelets: 384 10*3/uL (ref 150–400)
RBC: 3.34 MIL/uL — ABNORMAL LOW (ref 4.22–5.81)
RDW: 13.7 % (ref 11.5–15.5)
WBC: 10.5 10*3/uL (ref 4.0–10.5)
nRBC: 0 % (ref 0.0–0.2)

## 2022-02-03 LAB — RENAL FUNCTION PANEL
Albumin: 2.3 g/dL — ABNORMAL LOW (ref 3.5–5.0)
Anion gap: 9 (ref 5–15)
BUN: 59 mg/dL — ABNORMAL HIGH (ref 8–23)
CO2: 31 mmol/L (ref 22–32)
Calcium: 8.6 mg/dL — ABNORMAL LOW (ref 8.9–10.3)
Chloride: 90 mmol/L — ABNORMAL LOW (ref 98–111)
Creatinine, Ser: 1.93 mg/dL — ABNORMAL HIGH (ref 0.61–1.24)
GFR, Estimated: 34 mL/min — ABNORMAL LOW (ref >60)
Glucose, Bld: 130 mg/dL — ABNORMAL HIGH (ref 70–99)
Phosphorus: 3.6 mg/dL (ref 2.5–4.6)
Potassium: 4.5 mmol/L (ref 3.5–5.1)
Sodium: 130 mmol/L — ABNORMAL LOW (ref 135–145)

## 2022-02-03 LAB — GLUCOSE, CAPILLARY
Glucose-Capillary: 117 mg/dL — ABNORMAL HIGH (ref 70–99)
Glucose-Capillary: 181 mg/dL — ABNORMAL HIGH (ref 70–99)
Glucose-Capillary: 284 mg/dL — ABNORMAL HIGH (ref 70–99)
Glucose-Capillary: 281 mg/dL — ABNORMAL HIGH (ref 70–99)
Glucose-Capillary: 162 mg/dL — ABNORMAL HIGH (ref 70–99)

## 2022-02-03 LAB — MAGNESIUM: Magnesium: 2 mg/dL (ref 1.7–2.4)

## 2022-02-03 LAB — BRAIN NATRIURETIC PEPTIDE: B Natriuretic Peptide: 545.6 pg/mL — ABNORMAL HIGH (ref 0.0–100.0)

## 2022-02-03 MED ORDER — INSULIN ASPART 100 UNIT/ML IJ SOLN
0.0000 [IU] | Freq: Three times a day (TID) | INTRAMUSCULAR | Status: DC
  Administered 2022-02-03 – 2022-02-04 (×2): 5 [IU] via SUBCUTANEOUS
  Administered 2022-02-04: 2 [IU] via SUBCUTANEOUS

## 2022-02-03 MED ORDER — INSULIN ASPART 100 UNIT/ML IJ SOLN: 0.0000 [IU] | Freq: Every day | INTRAMUSCULAR | Status: DC

## 2022-02-03 MED ORDER — OXYCODONE HCL 5 MG PO TABS: 2.5000 mg | ORAL_TABLET | Freq: Four times a day (QID) | ORAL | 0 refills | Status: DC | PRN

## 2022-02-03 MED ORDER — DICLOFENAC SODIUM 1 % EX GEL
2.0000 g | Freq: Four times a day (QID) | CUTANEOUS | Status: DC
  Administered 2022-02-03 – 2022-02-04 (×4): 2 g via TOPICAL
  Filled 2022-02-03: qty 100

## 2022-02-03 MED ORDER — INSULIN ASPART 100 UNIT/ML IJ SOLN
3.0000 [IU] | Freq: Three times a day (TID) | INTRAMUSCULAR | Status: DC
  Administered 2022-02-03 – 2022-02-04 (×3): 3 [IU] via SUBCUTANEOUS

## 2022-02-03 MED ORDER — TORSEMIDE 20 MG PO TABS
20.0000 mg | ORAL_TABLET | Freq: Every day | ORAL | Status: DC
  Administered 2022-02-03 – 2022-02-04 (×2): 20 mg via ORAL
  Filled 2022-02-03 (×2): qty 1

## 2022-02-03 NOTE — Progress Notes (Signed)
PROGRESS NOTE  Bradley Sheppard EXN:[id number] DOB: 02/10/1941   PCP: [name], MD  Patient is from: SNF  DOA: 01/28/2022 LOS: 6  Chief complaints Chief Complaint  Patient presents with   Abnormal Lab     Brief Narrative / Interim history: 81 year old M with PMH of WHQ-7R, diastolic CHF, HTN, DM-2, rectal mass status post partial proctectomy by transanal endoscopic microsurgery on 12/31/21, urinary retention with chronic Foley and Hodgkin's lymphoma sent to [facility] ED by his oncologist after he presented there with general weakness, malaise, abdominal pain and burning sensation in the rectal area.  He was hyponatremic to 121.  Leukocytosis to 15.3.  CT abdomen and pelvis showed perivesical stranding raising concern for cystitis and possible rectal perforation.  Patient was started on IV Zosyn.  General surgery consulted and recommended transfer to [facility] for further evaluation and care.  After evaluation, surgical team did not feel there is need for surgical intervention or antibiotic, and recommended sitz bath's, scheduled Tylenol with as needed oxycodone and bowel regimen to avoid constipation.   In regards to hyponatremia, felt to be dilutional in the setting of volume.  Started on IV Lasix and improved to 130 but started declining slowly.  Fluid restriction started possible SIADH.  In regards to catheter associated UTI, urine culture grew pansensitive E. coli.  Completed antibiotic course on 10/1.  Catheter was exchanged at [facility] on 9/28.  Waiting on SNF bed.  Medically optimized for discharge.   Subjective: Seen and examined earlier this morning.  No major events overnight of this morning.  No complaints other than some pain in his bottom.  No shortness of breath.  Objective: Vitals:   02/02/22 1519 02/02/22 2014 02/03/22 0516 02/03/22 1343  BP: 102/66 119/60 112/70 100/64  Pulse: (!) 55 87 93 93  Resp: '17 20 20 17  '$ Temp: 98.3 F (36.8 C)  97.8 F (36.6 C) 97.8 F (36.6 C) 98.1 F (36.7 C)  TempSrc: Oral Oral Oral Oral  SpO2: 100% 98% 94% 99%  Weight:      Height:        Examination:  GENERAL: No apparent distress.  Nontoxic. HEENT: MMM.  Vision and hearing grossly intact.  NECK: Supple.  No apparent JVD.  RESP:  No IWOB.  Fair aeration bilaterally. CVS:  RRR. Heart sounds normal.  ABD/GI/GU: BS+. Abd soft, NTND.  Foley catheter in place. MSK/EXT:  Moves extremities. No apparent deformity.  Trace pedal edema bilaterally SKIN: no apparent skin lesion or wound NEURO: Awake and alert. Oriented fairly.  No apparent focal neuro deficit. PSYCH: Calm. Normal affect.   Procedures:  None  Microbiology summarized: Blood cultures NGTD Urine culture with 80,000 colonies of E. coli.  Assessment and plan: Principal Problem:   Hyponatremia Active Problems:   Moderate-Severe Aortic stenosis   DMII (diabetes mellitus, type 2) (HCC)   Rectal mass   Essential hypertension, benign   MALT lymphoma (HCC)   Acute on chronic diastolic CHF (congestive heart failure) (HCC)   Chronic kidney disease, stage 3b (HCC)   Pressure injury of skin   Urinary retention   Sepsis due to gram-negative UTI (HCC)   Atrial fibrillation (HCC)   Catheter-associated urinary tract infection (HCC)   Physical deconditioning   Goals of care, counseling/discussion  Hyponatremia: Na 121 on admission.  Likely due to hypervolemia and some SIADH.  Improved with IV Lasix and fluid restriction. Recent Labs  Lab 01/28/22 1008 01/28/22 1337 01/29/22 0459 01/30/22  7673 01/31/22 0602 02/01/22 0804 02/02/22 0445 02/03/22 0521  NA 121* 122* 125* 130* 129* 128* 127* 130*  -Change diuretics to p.o. torsemide 20 mg daily -Continue fluid restriction to 1200 cc a day.  Discussed with patient, family and RN. -Recommend discontinuing Actos on discharge due to risk for fluid retention  Sepsis due to catheter associated E. coli UTI: POA.  Had leukocytosis  and tachycardia POA.  Blood cultures NGTD.  Urine culture with pansensitive E. coli.  Catheter exchanged in ED. Sepsis resolved. -Completed 5 days of appropriate antibiotics on 10/1  Acute on chronic diastolic CHF: TTE in the 08/1935 with LVEF of 65 to 70%, G2 DD, severe LAE, moderate RAE, moderate aortic stenosis and normal RVSP.  Was somewhat hypervolemic with hyponatremia on presentation.  BNP elevated to 527.  Started on IV Lasix.  Net -6.5 L.  Cr slightly up. -Changed to p.o. torsemide 20 mg daily -Continue holding home metolazone -Recommend discontinuing Actos on discharge -Fluid restriction to 1200 cc in the setting of possible SIADH -Strict intake and output, daily weight, renal functions and electrolytes   Rectal mass s/p resection with transanal endoscopic microsurgery on 12/31/2021: CT abdomen and pelvis raised concern for microperforation with felt to be less likely after evaluation by general surgery. -Surgery recommended  sitz bath's, scheduled Tylenol with as needed oxycodone and bowel regimen -Changed diet from dysphagia 1 to soft after discussion with surgery on 10/1.  Essential hypertension: Normotensive. -Continue home Coreg -Lasix as above   Uncontrolled NIDDM-2 with hyperglycemia: A1c 7.2% on 12/31/2021. Recent Labs  Lab 02/02/22 1214 02/02/22 1822 02/02/22 2334 02/03/22 0517 02/03/22 1149  GLUCAP 193* 271* 181* 117* 284*  -Continue SSI-sensitive -Continue Semglee 5 units daily -Add NovoLog 3 units 3 times daily with meals -Recommend discontinuing Actos on discharge due to risk for fluid retention -Continue home Crestor  Paroxysmal A-fib: Not on anticoagulation due to history of frequent falls.  On aspirin only.CHADs2VASC score- at least 4.  -Continue home Coreg and low-dose aspirin   CKD stage IIIb: Slightly up but at baseline. Recent Labs    01/11/22 0507 01/13/22 0817 01/28/22 1008 01/28/22 1337 01/29/22 0459 01/30/22 0555 01/31/22 0602 02/01/22 0804  02/02/22 0445 02/03/22 0521  BUN 44* 55* 52* 52* 46* 43* 46* 54* 53* 59*  CREATININE 1.69* 1.91* 1.86* 1.91* 1.71* 1.75* 1.79* 1.71* 1.90* 1.93*  -Adjusted diuretics as above  History of CAD:  Status post DES to LAD and RCA in 2003.  On aspirin, Coreg, Crestor at home. -Continue home medications   History of Hodgkin's lymphoma:  -Outpatient follow-up with oncology   Normocytic anemia: H&H stable. Recent Labs    01/12/22 0448 01/13/22 0817 01/28/22 1008 01/28/22 1337 01/29/22 0459 01/30/22 0555 01/31/22 0602 02/01/22 0804 02/02/22 0445 02/03/22 0521  HGB 9.1* 8.9* 9.3* 9.6* 9.2* 9.9* 9.1* 8.9* 9.3* 10.1*  -Received IV ferric gluconate on 8/28 and 9/29. -Monitor  Urinary retention with indwelling Foley catheter since rectal mass resection on 12/31/2021. -Foley catheter exchanged in ED -Continue home Flomax -Outpatient follow-up with urology for voiding trial  Goal of care counseling: Patient and family decided to change CODE STATUS to DNR/DNI -CODE STATUS updated in computer -DNR paper signed.  Physical deconditioning -PT/OT-recommended to return to SNF which I believe is appropriate given his significant deconditioning in the setting of acute hyponatremia, sepsis, acute on chronic diastolic CHF not to mention his coccygeal pressure ulcer and rectal pain and discomfort after rectal mass resection.  Patient's insurance wants to peer  review but did not answer the phone call.  SNF placement approved by Madelaine Bhat at [phone] after peer to peer on 9/30.  Body mass index is 25.63 kg/m.   Pressure skin injury: POA Pressure Injury 01/29/22 Coccyx Stage 3 -  Full thickness tissue loss. Subcutaneous fat may be visible but bone, tendon or muscle are NOT exposed. (Active)  01/29/22   Location: Coccyx  Location Orientation:   Staging: Stage 3 -  Full thickness tissue loss. Subcutaneous fat may be visible but bone, tendon or muscle are NOT exposed.  Wound Description  (Comments):   Present on Admission: Yes  Dressing Type Foam - Lift dressing to assess site every shift 01/29/22 1951   DVT prophylaxis:  SCDs Start: 01/28/22 2252  Code Status: Full code Family Communication: Updated patient's wife and sister-in-law at bedside Level of care: Telemetry Status is: Inpatient Remains inpatient appropriate because: SNF bed   Final disposition: SNF Consultants:  General surgery  Sch Meds:  Scheduled Meds:  carvedilol  12.5 mg Oral BID WC   Chlorhexidine Gluconate Cloth  6 each Topical Q0600   diclofenac Sodium  2 g Topical QID   feeding supplement  237 mL Oral BID BM   insulin aspart  0-9 Units Subcutaneous Q6H   insulin glargine-yfgn  5 Units Subcutaneous Daily   lip balm   Topical BID   liver oil-zinc oxide   Topical BID   LORazepam  0.5 mg Oral QHS   polycarbophil  625 mg Oral BID   [START ON 02/04/2022] rosuvastatin  5 mg Oral Q Wed   tamsulosin  0.4 mg Oral Daily   torsemide  20 mg Oral Daily   Continuous Infusions:   PRN Meds:.acetaminophen **OR** [DISCONTINUED] acetaminophen, alum & mag hydroxide-simeth, magic mouthwash, menthol-cetylpyridinium, morphine injection, ondansetron **OR** ondansetron (ZOFRAN) IV, mouth rinse, oxyCODONE, phenol, polyethylene glycol, simethicone, witch hazel-glycerin  Antimicrobials: Anti-infectives (From admission, onward)    Start     Dose/Rate Route Frequency Ordered Stop   02/01/22 1200  ceFAZolin (ANCEF) IVPB 2g/100 mL premix        2 g 200 mL/hr over 30 Minutes Intravenous  Once 01/31/22 1340 02/01/22 1253   01/30/22 1200  cefTRIAXone (ROCEPHIN) 1 g in sodium chloride 0.9 % 100 mL IVPB  Status:  Discontinued        1 g 200 mL/hr over 30 Minutes Intravenous Every 24 hours 01/30/22 1110 01/31/22 1340   01/28/22 2300  piperacillin-tazobactam (ZOSYN) IVPB 3.375 g  Status:  Discontinued        3.375 g 12.5 mL/hr over 240 Minutes Intravenous Every 8 hours 01/28/22 2247 01/30/22 1110   01/28/22 2200   piperacillin-tazobactam (ZOSYN) IVPB 3.375 g  Status:  Discontinued        3.375 g 12.5 mL/hr over 240 Minutes Intravenous Every 8 hours 01/28/22 1603 01/28/22 2323   01/28/22 1800  piperacillin-tazobactam (ZOSYN) IVPB 3.375 g  Status:  Discontinued        3.375 g 12.5 mL/hr over 240 Minutes Intravenous Every 6 hours 01/28/22 1558 01/28/22 1600   01/28/22 1615  piperacillin-tazobactam (ZOSYN) IVPB 2.25 g  Status:  Discontinued        2.25 g 100 mL/hr over 30 Minutes Intravenous Every 8 hours 01/28/22 1600 01/28/22 1603   01/28/22 1615  piperacillin-tazobactam (ZOSYN) IVPB 3.375 g        3.375 g 100 mL/hr over 30 Minutes Intravenous  Once 01/28/22 1603 01/28/22 1712        I have  personally reviewed the following labs and images: CBC: Recent Labs  Lab 01/28/22 1008 01/28/22 1337 01/29/22 0459 01/30/22 0555 01/31/22 0602 02/01/22 0804 02/02/22 0445 02/03/22 0521  WBC 14.8* 15.3*   < > 10.5 10.0 10.1 8.5 10.5  NEUTROABS 10.9* 11.2*  --   --   --   --   --   --   HGB 9.3* 9.6*   < > 9.9* 9.1* 8.9* 9.3* 10.1*  HCT 26.9* 28.1*   < > 30.1* 27.6* 27.0* 27.8* 30.4*  MCV 89.1 89.2   < > 90.9 91.1 90.9 90.8 91.0  PLT 387 392   < > 422* 393 385 382 384   < > = values in this interval not displayed.   BMP &GFR Recent Labs  Lab 01/28/22 1337 01/29/22 0459 01/30/22 0555 01/31/22 0602 02/01/22 0804 02/02/22 0445 02/03/22 0521  NA 122*   < > 130* 129* 128* 127* 130*  K 4.6   < > 4.0 4.0 3.6 3.7 4.5  CL 86*   < > 91* 91* 89* 90* 90*  CO2 24   < > '29 27 31 31 31  '$ GLUCOSE 169*   < > 114* 111* 135* 123* 130*  BUN 52*   < > 43* 46* 54* 53* 59*  CREATININE 1.91*   < > 1.75* 1.79* 1.71* 1.90* 1.93*  CALCIUM 8.2*   < > 8.5* 8.2* 8.3* 8.0* 8.6*  MG 1.9  --   --  1.5* 2.0 1.9 2.0  PHOS  --   --   --  3.8 3.5 3.5 3.6   < > = values in this interval not displayed.   Estimated Creatinine Clearance: 31.8 mL/min (A) (by C-G formula based on SCr of 1.93 mg/dL (H)). Liver &  Pancreas: Recent Labs  Lab 01/28/22 1008 01/28/22 1337 01/31/22 0602 02/01/22 0804 02/02/22 0445 02/03/22 0521  AST 21 22  --   --   --   --   ALT 17 16  --   --   --   --   ALKPHOS 81 81  --   --   --   --   BILITOT 0.7 0.8  --   --   --   --   PROT 6.3* 6.6  --   --   --   --   ALBUMIN 2.4* 2.4* 2.2* 2.2* 2.1* 2.3*   No results for input(s): "LIPASE", "AMYLASE" in the last 168 hours. No results for input(s): "AMMONIA" in the last 168 hours. Diabetic: No results for input(s): "HGBA1C" in the last 72 hours. Recent Labs  Lab 02/02/22 1214 02/02/22 1822 02/02/22 2334 02/03/22 0517 02/03/22 1149  GLUCAP 193* 271* 181* 117* 284*   Cardiac Enzymes: No results for input(s): "CKTOTAL", "CKMB", "CKMBINDEX", "TROPONINI" in the last 168 hours. No results for input(s): "PROBNP" in the last 8760 hours. Coagulation Profile: No results for input(s): "INR", "PROTIME" in the last 168 hours. Thyroid Function Tests: No results for input(s): "TSH", "T4TOTAL", "FREET4", "T3FREE", "THYROIDAB" in the last 72 hours. Lipid Profile: No results for input(s): "CHOL", "HDL", "LDLCALC", "TRIG", "CHOLHDL", "LDLDIRECT" in the last 72 hours. Anemia Panel: No results for input(s): "VITAMINB12", "FOLATE", "FERRITIN", "TIBC", "IRON", "RETICCTPCT" in the last 72 hours.  Urine analysis:    Component Value Date/Time   COLORURINE YELLOW 01/28/2022 1455   APPEARANCEUR HAZY (A) 01/28/2022 1455   LABSPEC 1.010 01/28/2022 1455   PHURINE 5.0 01/28/2022 1455   GLUCOSEU NEGATIVE 01/28/2022 1455   HGBUR MODERATE (A)  01/28/2022 [place] 01/28/2022 1455   [place] 01/28/2022 1455   PROTEINUR NEGATIVE 01/28/2022 1455   UROBILINOGEN 0.2 12/26/2008 0950   NITRITE NEGATIVE 01/28/2022 1455   LEUKOCYTESUR LARGE (A) 01/28/2022 1455   Sepsis Labs: Invalid input(s): "PROCALCITONIN", "LACTICIDVEN"  Microbiology: Recent Results (from the past 240 hour(s))  Remove and replace urinary  cath (placed > 5 days) then obtain urine culture from new indwelling urinary catheter.     Status: Abnormal   Collection Time: 01/28/22 10:05 PM   Specimen: Urine, Catheterized  Result Value Ref Range Status   Specimen Description   Final    URINE, CATHETERIZED Performed at [facility], [address], [place], [place] [address]    Special Requests   Final    NONE Performed at [facility], [address], [place], [place] [address]    Culture 80,000 COLONIES/mL ESCHERICHIA COLI (A)  Final   Report Status 01/31/2022 FINAL  Final   Organism ID, Bacteria ESCHERICHIA COLI (A)  Final      Susceptibility   Escherichia coli - MIC*    AMPICILLIN <=2 SENSITIVE Sensitive     CEFAZOLIN <=4 SENSITIVE Sensitive     CEFEPIME <=0.12 SENSITIVE Sensitive     CEFTRIAXONE <=0.25 SENSITIVE Sensitive     CIPROFLOXACIN <=0.25 SENSITIVE Sensitive     GENTAMICIN <=1 SENSITIVE Sensitive     IMIPENEM <=0.25 SENSITIVE Sensitive     NITROFURANTOIN <=16 SENSITIVE Sensitive     TRIMETH/SULFA <=20 SENSITIVE Sensitive     AMPICILLIN/SULBACTAM <=2 SENSITIVE Sensitive     PIP/TAZO <=4 SENSITIVE Sensitive     * 80,000 COLONIES/mL ESCHERICHIA COLI  Culture, blood (Routine X 2) w Reflex to ID Panel     Status: None   Collection Time: 01/28/22 10:37 PM   Specimen: Left Antecubital; Blood  Result Value Ref Range Status   Specimen Description LEFT ANTECUBITAL  Final   Special Requests   Final    BOTTLES DRAWN AEROBIC AND ANAEROBIC Blood Culture adequate volume   Culture   Final    NO GROWTH 5 DAYS Performed at [facility], [address], [place], [place] [address]    Report Status 02/02/2022 FINAL  Final  Culture, blood (Routine X 2) w Reflex to ID Panel     Status: None   Collection Time: 01/28/22 10:37 PM   Specimen: BLOOD RIGHT HAND  Result Value Ref Range Status   Specimen Description BLOOD RIGHT HAND  Final   Special Requests   Final    BOTTLES DRAWN AEROBIC AND ANAEROBIC Blood Culture adequate volume    Culture   Final    NO GROWTH 5 DAYS Performed at [facility], [address], [place], [place] [address]    Report Status 02/02/2022 FINAL  Final  Urine Culture     Status: None   Collection Time: 01/29/22  7:51 AM   Specimen: Urine, Clean Catch  Result Value Ref Range Status   Specimen Description   Final    URINE, CLEAN CATCH Performed at [facility], [address] [place], [place] [address]    Special Requests   Final    NONE Performed at [facility], [address] [place], [place] [address]    Culture   Final    NO GROWTH Performed at [facility], [place] [address] [place], [place] [address]    Report Status 01/30/2022 FINAL  Final    Radiology Studies: No results found.    Makynleigh Breslin T.  [place]  If 7PM-7AM, please contact night-coverage [email or web address] 02/03/2022, 3:58 PM

## 2022-02-03 NOTE — TOC Progression Note (Signed)
Transition of Care [facility]) - Progression Note    Patient Details  Name: Bradley Sheppard MRN: [id number] Date of Birth: April 21, 1941  Transition of Care [facility]) CM/SW [place], [place] Phone Number: 02/03/2022, 11:56 AM  Clinical Narrative:    CSW contacted [facility] regarding pt returning to their facility. CSW was told that due to staffing issues they are unable to accept pt back to their facility until tomorrow 10/4. MD and pt's spouse have been notified.    Expected Discharge Plan: [place] Barriers to Discharge: Continued Medical Work up  Expected Discharge Plan and Services Expected Discharge Plan: [place] In-house Referral: Clinical Social Work Discharge Planning Services: CM Consult   Living arrangements for the past 2 months: Single Family Home Expected Discharge Date: 02/03/22                                     Social Determinants of Health (SDOH) Interventions    Readmission Risk Interventions    01/29/2022    1:26 PM 01/10/2022    8:53 AM 01/01/2022    2:42 PM  Readmission Risk Prevention Plan  Transportation Screening Complete Complete Complete  PCP or Specialist Appt within 3-5 Days  Not Complete   HRI or [place]  Complete   Social Work Consult for [place] Planning/Counseling  Complete   Palliative Care Screening  Not Complete   Medication Review Press photographer) Complete Complete   HRI or [place] Complete    SW Recovery Care/Counseling Consult Complete    [place] Not Applicable

## 2022-02-03 NOTE — Plan of Care (Signed)
  Problem: Education: Goal: Understanding of discharge needs will improve Outcome: Progressing Goal: Verbalization of understanding of the causes of altered bowel function will improve Outcome: Progressing   Problem: Activity: Goal: Ability to tolerate increased activity will improve Outcome: Progressing   Problem: Bowel/Gastric: Goal: Gastrointestinal status for postoperative course will improve Outcome: Progressing   Problem: Health Behavior/Discharge Planning: Goal: Identification of community resources to assist with postoperative recovery needs will improve Outcome: Progressing   Problem: Nutritional: Goal: Will attain and maintain optimal nutritional status will improve Outcome: Progressing   Problem: Clinical Measurements: Goal: Postoperative complications will be avoided or minimized Outcome: Progressing   Problem: Respiratory: Goal: Respiratory status will improve Outcome: Progressing   Problem: Skin Integrity: Goal: Will show signs of wound healing Outcome: Progressing   Problem: Education: Goal: Ability to describe self-care measures that may prevent or decrease complications (Diabetes Survival Skills Education) will improve Outcome: Progressing Goal: Individualized Educational Video(s) Outcome: Progressing   Problem: Coping: Goal: Ability to adjust to condition or change in health will improve Outcome: Progressing   Problem: Fluid Volume: Goal: Ability to maintain a balanced intake and output will improve Outcome: Progressing   Problem: Health Behavior/Discharge Planning: Goal: Ability to identify and utilize available resources and services will improve Outcome: Progressing Goal: Ability to manage health-related needs will improve Outcome: Progressing   Problem: Metabolic: Goal: Ability to maintain appropriate glucose levels will improve Outcome: Progressing   Problem: Nutritional: Goal: Maintenance of adequate nutrition will improve Outcome:  Progressing Goal: Progress toward achieving an optimal weight will improve Outcome: Progressing   Problem: Skin Integrity: Goal: Risk for impaired skin integrity will decrease Outcome: Progressing   Problem: Tissue Perfusion: Goal: Adequacy of tissue perfusion will improve Outcome: Progressing   Problem: Education: Goal: Knowledge of General Education information will improve Description: Including pain rating scale, medication(s)/side effects and non-pharmacologic comfort measures Outcome: Progressing   Problem: Health Behavior/Discharge Planning: Goal: Ability to manage health-related needs will improve Outcome: Progressing   Problem: Clinical Measurements: Goal: Ability to maintain clinical measurements within normal limits will improve Outcome: Progressing Goal: Will remain free from infection Outcome: Progressing Goal: Diagnostic test results will improve Outcome: Progressing Goal: Respiratory complications will improve Outcome: Progressing Goal: Cardiovascular complication will be avoided Outcome: Progressing   Problem: Activity: Goal: Risk for activity intolerance will decrease Outcome: Progressing   Problem: Nutrition: Goal: Adequate nutrition will be maintained Outcome: Progressing   Problem: Coping: Goal: Level of anxiety will decrease Outcome: Progressing   Problem: Elimination: Goal: Will not experience complications related to bowel motility Outcome: Progressing Goal: Will not experience complications related to urinary retention Outcome: Progressing   Problem: Pain Managment: Goal: General experience of comfort will improve Outcome: Progressing   Problem: Safety: Goal: Ability to remain free from injury will improve Outcome: Progressing   Problem: Skin Integrity: Goal: Risk for impaired skin integrity will decrease Outcome: Progressing

## 2022-02-04 DIAGNOSIS — K6289 Other specified diseases of anus and rectum: Secondary | ICD-10-CM | POA: Diagnosis not present

## 2022-02-04 DIAGNOSIS — E871 Hypo-osmolality and hyponatremia: Secondary | ICD-10-CM | POA: Diagnosis not present

## 2022-02-04 DIAGNOSIS — G8929 Other chronic pain: Secondary | ICD-10-CM | POA: Diagnosis not present

## 2022-02-04 DIAGNOSIS — Z978 Presence of other specified devices: Secondary | ICD-10-CM | POA: Diagnosis not present

## 2022-02-04 DIAGNOSIS — M25562 Pain in left knee: Secondary | ICD-10-CM | POA: Diagnosis not present

## 2022-02-04 DIAGNOSIS — I509 Heart failure, unspecified: Secondary | ICD-10-CM | POA: Diagnosis not present

## 2022-02-04 DIAGNOSIS — R8290 Unspecified abnormal findings in urine: Secondary | ICD-10-CM | POA: Diagnosis not present

## 2022-02-04 DIAGNOSIS — M179 Osteoarthritis of knee, unspecified: Secondary | ICD-10-CM | POA: Diagnosis not present

## 2022-02-04 DIAGNOSIS — R000 Tachycardia, unspecified: Secondary | ICD-10-CM | POA: Diagnosis not present

## 2022-02-04 DIAGNOSIS — M6281 Muscle weakness (generalized): Secondary | ICD-10-CM | POA: Diagnosis not present

## 2022-02-04 DIAGNOSIS — N189 Chronic kidney disease, unspecified: Secondary | ICD-10-CM | POA: Diagnosis not present

## 2022-02-04 DIAGNOSIS — I5043 Acute on chronic combined systolic (congestive) and diastolic (congestive) heart failure: Secondary | ICD-10-CM | POA: Diagnosis not present

## 2022-02-04 DIAGNOSIS — Z299 Encounter for prophylactic measures, unspecified: Secondary | ICD-10-CM | POA: Diagnosis not present

## 2022-02-04 DIAGNOSIS — Z960 Presence of urogenital implants: Secondary | ICD-10-CM | POA: Diagnosis not present

## 2022-02-04 DIAGNOSIS — C884 Extranodal marginal zone B-cell lymphoma of mucosa-associated lymphoid tissue [MALT-lymphoma]: Secondary | ICD-10-CM | POA: Diagnosis not present

## 2022-02-04 DIAGNOSIS — Z888 Allergy status to other drugs, medicaments and biological substances status: Secondary | ICD-10-CM | POA: Diagnosis not present

## 2022-02-04 DIAGNOSIS — Z7982 Long term (current) use of aspirin: Secondary | ICD-10-CM | POA: Diagnosis not present

## 2022-02-04 DIAGNOSIS — Z7984 Long term (current) use of oral hypoglycemic drugs: Secondary | ICD-10-CM | POA: Diagnosis not present

## 2022-02-04 DIAGNOSIS — L89152 Pressure ulcer of sacral region, stage 2: Secondary | ICD-10-CM | POA: Diagnosis not present

## 2022-02-04 DIAGNOSIS — T83511D Infection and inflammatory reaction due to indwelling urethral catheter, subsequent encounter: Secondary | ICD-10-CM | POA: Diagnosis not present

## 2022-02-04 DIAGNOSIS — T83511A Infection and inflammatory reaction due to indwelling urethral catheter, initial encounter: Secondary | ICD-10-CM | POA: Diagnosis not present

## 2022-02-04 DIAGNOSIS — Z66 Do not resuscitate: Secondary | ICD-10-CM | POA: Diagnosis not present

## 2022-02-04 DIAGNOSIS — N1832 Chronic kidney disease, stage 3b: Secondary | ICD-10-CM | POA: Diagnosis not present

## 2022-02-04 DIAGNOSIS — R339 Retention of urine, unspecified: Secondary | ICD-10-CM | POA: Diagnosis not present

## 2022-02-04 DIAGNOSIS — M25561 Pain in right knee: Secondary | ICD-10-CM | POA: Diagnosis not present

## 2022-02-04 DIAGNOSIS — R42 Dizziness and giddiness: Secondary | ICD-10-CM | POA: Diagnosis not present

## 2022-02-04 DIAGNOSIS — L89309 Pressure ulcer of unspecified buttock, unspecified stage: Secondary | ICD-10-CM | POA: Diagnosis not present

## 2022-02-04 DIAGNOSIS — I350 Nonrheumatic aortic (valve) stenosis: Secondary | ICD-10-CM | POA: Diagnosis not present

## 2022-02-04 DIAGNOSIS — L8990 Pressure ulcer of unspecified site, unspecified stage: Secondary | ICD-10-CM | POA: Diagnosis not present

## 2022-02-04 DIAGNOSIS — F419 Anxiety disorder, unspecified: Secondary | ICD-10-CM | POA: Diagnosis not present

## 2022-02-04 DIAGNOSIS — Z886 Allergy status to analgesic agent status: Secondary | ICD-10-CM | POA: Diagnosis not present

## 2022-02-04 DIAGNOSIS — N1831 Chronic kidney disease, stage 3a: Secondary | ICD-10-CM | POA: Diagnosis not present

## 2022-02-04 DIAGNOSIS — E1122 Type 2 diabetes mellitus with diabetic chronic kidney disease: Secondary | ICD-10-CM | POA: Diagnosis not present

## 2022-02-04 DIAGNOSIS — I25119 Atherosclerotic heart disease of native coronary artery with unspecified angina pectoris: Secondary | ICD-10-CM | POA: Diagnosis not present

## 2022-02-04 DIAGNOSIS — R531 Weakness: Secondary | ICD-10-CM | POA: Diagnosis not present

## 2022-02-04 DIAGNOSIS — I130 Hypertensive heart and chronic kidney disease with heart failure and stage 1 through stage 4 chronic kidney disease, or unspecified chronic kidney disease: Secondary | ICD-10-CM | POA: Diagnosis not present

## 2022-02-04 DIAGNOSIS — R0602 Shortness of breath: Secondary | ICD-10-CM | POA: Diagnosis not present

## 2022-02-04 DIAGNOSIS — E7800 Pure hypercholesterolemia, unspecified: Secondary | ICD-10-CM | POA: Diagnosis not present

## 2022-02-04 DIAGNOSIS — I2510 Atherosclerotic heart disease of native coronary artery without angina pectoris: Secondary | ICD-10-CM | POA: Diagnosis not present

## 2022-02-04 DIAGNOSIS — I252 Old myocardial infarction: Secondary | ICD-10-CM | POA: Diagnosis not present

## 2022-02-04 DIAGNOSIS — A4150 Gram-negative sepsis, unspecified: Secondary | ICD-10-CM | POA: Diagnosis not present

## 2022-02-04 DIAGNOSIS — E1165 Type 2 diabetes mellitus with hyperglycemia: Secondary | ICD-10-CM | POA: Diagnosis not present

## 2022-02-04 DIAGNOSIS — Z87891 Personal history of nicotine dependence: Secondary | ICD-10-CM | POA: Diagnosis not present

## 2022-02-04 DIAGNOSIS — F1320 Sedative, hypnotic or anxiolytic dependence, uncomplicated: Secondary | ICD-10-CM | POA: Diagnosis not present

## 2022-02-04 DIAGNOSIS — Z7401 Bed confinement status: Secondary | ICD-10-CM | POA: Diagnosis not present

## 2022-02-04 DIAGNOSIS — I480 Paroxysmal atrial fibrillation: Secondary | ICD-10-CM | POA: Diagnosis not present

## 2022-02-04 DIAGNOSIS — Z87440 Personal history of urinary (tract) infections: Secondary | ICD-10-CM | POA: Diagnosis not present

## 2022-02-04 DIAGNOSIS — N390 Urinary tract infection, site not specified: Secondary | ICD-10-CM | POA: Diagnosis not present

## 2022-02-04 DIAGNOSIS — I5031 Acute diastolic (congestive) heart failure: Secondary | ICD-10-CM | POA: Diagnosis not present

## 2022-02-04 DIAGNOSIS — N139 Obstructive and reflux uropathy, unspecified: Secondary | ICD-10-CM | POA: Diagnosis not present

## 2022-02-04 DIAGNOSIS — I5033 Acute on chronic diastolic (congestive) heart failure: Secondary | ICD-10-CM | POA: Diagnosis not present

## 2022-02-04 DIAGNOSIS — I10 Essential (primary) hypertension: Secondary | ICD-10-CM | POA: Diagnosis not present

## 2022-02-04 DIAGNOSIS — Z7189 Other specified counseling: Secondary | ICD-10-CM | POA: Diagnosis not present

## 2022-02-04 DIAGNOSIS — L89312 Pressure ulcer of right buttock, stage 2: Secondary | ICD-10-CM | POA: Diagnosis not present

## 2022-02-04 DIAGNOSIS — M62838 Other muscle spasm: Secondary | ICD-10-CM | POA: Diagnosis not present

## 2022-02-04 DIAGNOSIS — R2689 Other abnormalities of gait and mobility: Secondary | ICD-10-CM | POA: Diagnosis not present

## 2022-02-04 DIAGNOSIS — I4891 Unspecified atrial fibrillation: Secondary | ICD-10-CM | POA: Diagnosis not present

## 2022-02-04 LAB — GLUCOSE, CAPILLARY
Glucose-Capillary: 176 mg/dL — ABNORMAL HIGH (ref 70–99)
Glucose-Capillary: 287 mg/dL — ABNORMAL HIGH (ref 70–99)

## 2022-02-04 MED ORDER — GLIPIZIDE ER 10 MG PO TB24: 10.0000 mg | ORAL_TABLET | Freq: Every day | ORAL | Status: AC

## 2022-02-04 MED ORDER — WITCH HAZEL-GLYCERIN EX PADS: MEDICATED_PAD | CUTANEOUS | 12 refills | Status: DC | PRN

## 2022-02-04 MED ORDER — ENSURE SURGERY PO LIQD: 237.0000 mL | Freq: Two times a day (BID) | ORAL | Status: DC

## 2022-02-04 MED ORDER — LORAZEPAM 0.5 MG PO TABS: 0.5000 mg | ORAL_TABLET | Freq: Every day | ORAL | 0 refills | Status: DC

## 2022-02-04 MED ORDER — POLYETHYLENE GLYCOL 3350 17 G PO PACK: 17.0000 g | PACK | Freq: Two times a day (BID) | ORAL | 0 refills | Status: AC | PRN

## 2022-02-04 NOTE — Progress Notes (Signed)
Call report to [facility] aware Bradley Sheppard has been notified for transport.

## 2022-02-04 NOTE — Discharge Summary (Signed)
Physician Discharge Summary  AB LEAMING HEN:[id number] DOB: 02/01/1941 DOA: 01/28/2022  PCP: [name], MD  Admit date: 01/28/2022 Discharge date: 02/04/2022 Admitted From: SNF Disposition: SNF Recommendations for Outpatient Follow-up:  Follow up with PCP in 1 to 2 weeks Outpatient follow-up with general surgery as below Outpatient follow-up with urology as below Check BMP and CBC in 1 week Strict fluid restriction to less than 1500 cc a day Please follow up on the following pending results: None   Discharge Condition: Stable CODE STATUS: DNR/DNI  Follow-up Information     Michael Boston, MD Follow up in 2 month(s).   Specialties: General Surgery, Colon and Rectal Surgery Why: To follow up after your operation Contact information: [place] [address] [phone]         Cleon Gustin, MD Follow up on 02/09/2022.   Specialty: Urology Why: To have your Foley bladder catheter removed by PA - Cristal Ford [place]lin Contact information: [place] [address] [phone]                 Hospital course 81 year old M with PMH of [id number], diastolic CHF, HTN, DM-2, rectal mass status post partial proctectomy by transanal endoscopic microsurgery on 12/31/21, urinary retention with chronic Foley and Hodgkin's lymphoma sent to [facility] ED by his oncologist after he presented there with general weakness, malaise, abdominal pain and burning sensation in the rectal area.  He was hyponatremic to 121.  Leukocytosis to 15.3.  CT abdomen and pelvis showed perivesical stranding raising concern for cystitis and possible rectal perforation.  Patient was started on IV Zosyn.  General surgery consulted and recommended transfer to [facility] for further evaluation and care.   After evaluation, surgical team did not feel there is need for surgical intervention or antibiotic, and recommended sitz bath's,  scheduled Tylenol with as needed oxycodone and bowel regimen to avoid constipation.    In regards to hyponatremia, likely combination of dilution from fluid overload and SIADH.  Improved with diuretics and fluid restriction.  Patient to continue p.o. Lasix and fluid restriction.  We discontinued Actos.  Reassess fluid status, renal functions and electrolytes, and adjust diuretics as appropriate.   In regards to catheter associated UTI, urine culture grew pansensitive E. coli.  Completed antibiotic course on 10/1.  Catheter was exchanged at [facility] on 9/28.  He has upcoming appointment with urology.   Patient was evaluated by therapy who recommended return to SNF.  See individual problem list below for more.   Problems addressed during this hospitalization Principal Problem:   Hyponatremia Active Problems:   Moderate-Severe Aortic stenosis   DMII (diabetes mellitus, type 2) (HCC)   Rectal mass   Essential hypertension, benign   MALT lymphoma (HCC)   Acute on chronic diastolic CHF (congestive heart failure) (HCC)   Chronic kidney disease, stage 3b (HCC)   Pressure injury of skin   Urinary retention   Sepsis due to gram-negative UTI (HCC)   Atrial fibrillation (HCC)   Catheter-associated urinary tract infection (HCC)   Physical deconditioning   Goals of care, counseling/discussion   DNR (do not resuscitate)   Hyponatremia: Na 121 on admission.  Likely due to hypervolemia and some SIADH.  Improved with IV Lasix and fluid restriction. Last Labs   Recent Labs  Lab 01/28/22 1008 01/28/22 1337 01/29/22 0459 01/30/22 0555 01/31/22 0602 02/01/22 0804 02/02/22 0445 02/03/22 0521  NA 121* 122* 125*  130* 129* 128* 127* 130*    -Continue p.o. Lasix 40 mg daily -Fluid restriction to 1500 cc a day -Recheck sodium in 1 week   Sepsis due to catheter associated E. coli UTI: POA.  Had leukocytosis and tachycardia POA.  Blood cultures NGTD.  Urine culture with pansensitive E.  coli.  Catheter exchanged in ED. Sepsis resolved. -Completed 5 days of appropriate antibiotics on 10/1   Acute on chronic diastolic CHF: TTE in the 11/8467 with LVEF of 65 to 70%, G2 DD, severe LAE, moderate RAE, moderate aortic stenosis and normal RVSP.  Was somewhat hypervolemic with hyponatremia on presentation.  BNP elevated to 527.  Started on IV Lasix.  Net -6.5 L.  Cr stable. -Continue home p.o. Lasix 40 mg daily -Discontinued Actos and metolazone -Fluid restriction to less than 1500 cc a day -Reassess fluid status and adjust diuretics as appropriate -Renal function and electrolytes in 1 week   Rectal mass s/p resection with transanal endoscopic microsurgery on 12/31/2021: CT abdomen and pelvis raised concern for microperforation with felt to be less likely after evaluation by general surgery. -Surgery recommended  sitz bath's, scheduled Tylenol with as needed oxycodone and bowel regimen -Outpatient follow-up with general surgery.   Essential hypertension: Normotensive. -Continue home Coreg and Lasix.   Uncontrolled NIDDM-2 with hyperglycemia: A1c 7.2% on 12/31/2021. -Discontinued Actos given history CHF -Discontinue metformin given renal function -Decrease glipizide XL to daily -Continue Crestor   Paroxysmal A-fib: Not on anticoagulation due to history of frequent falls.  On aspirin only.CHADs2VASC score- at least 4.  -Continue home Coreg and low-dose aspirin   CKD stage IIIb: Slightly up but at baseline. Recent Labs (within last 365 days)              Recent Labs    01/11/22 0507 01/13/22 0817 01/28/22 1008 01/28/22 1337 01/29/22 0459 01/30/22 0555 01/31/22 0602 02/01/22 0804 02/02/22 0445 02/03/22 0521  BUN 44* 55* 52* 52* 46* 43* 46* 54* 53* 59*  CREATININE 1.69* 1.91* 1.86* 1.91* 1.71* 1.75* 1.79* 1.71* 1.90* 1.93*    -Recheck renal function in 1 week   History of CAD:  Status post DES to LAD and RCA in 2003.  On aspirin, Coreg, Crestor at home. -Continue home  medications   History of Hodgkin's lymphoma:  -Outpatient follow-up with oncology   Normocytic anemia: H&H stable. Recent Labs (within last 365 days)              Recent Labs    01/12/22 0448 01/13/22 0817 01/28/22 1008 01/28/22 1337 01/29/22 0459 01/30/22 0555 01/31/22 0602 02/01/22 0804 02/02/22 0445 02/03/22 0521  HGB 9.1* 8.9* 9.3* 9.6* 9.2* 9.9* 9.1* 8.9* 9.3* 10.1*    -Received IV ferric gluconate on 8/28 and 9/29. -Recheck CBC in 1 week   Urinary retention with indwelling Foley catheter since rectal mass resection on 12/31/2021. -Foley catheter exchanged in ED -Continue home Flomax -Outpatient follow-up with urology for voiding trial as previously planned   Goal of care counseling: Patient and family decided to change CODE STATUS to DNR/DNI -CODE STATUS updated in computer -DNR paper signed.  Cognitive impairment/anxiety: Fairly oriented. -Continue home Ativan   Physical deconditioning -PT/OT-recommended to return to SNF which I believe is appropriate given his significant deconditioning in the setting of acute hyponatremia, sepsis, acute on chronic diastolic CHF not to mention his coccygeal pressure ulcer and rectal pain and discomfort after rectal mass resection.  Patient's insurance wants to peer review but did not answer the phone call.  SNF placement approved by Madelaine Bhat at [phone] after peer to peer on 9/30.   Body mass index is 25.63 kg/m.   Pressure skin injury: POA Pressure Injury 11/28/21 Sacrum Stage 2 -  Partial thickness loss of dermis presenting as a shallow open injury with a red, pink wound bed without slough. area red sloughing (Active)  11/28/21 2300  Location: Sacrum  Location Orientation:   Staging: Stage 2 -  Partial thickness loss of dermis presenting as a shallow open injury with a red, pink wound bed without slough.  Wound Description (Comments): area red sloughing  Present on Admission:      Pressure Injury 01/08/22 Buttocks  Bilateral Stage 2 -  Partial thickness loss of dermis presenting as a shallow open injury with a red, pink wound bed without slough. (Active)  01/08/22 2018  Location: Buttocks  Location Orientation: Bilateral  Staging: Stage 2 -  Partial thickness loss of dermis presenting as a shallow open injury with a red, pink wound bed without slough.  Wound Description (Comments):   Present on Admission: Yes  Dressing Type Foam - Lift dressing to assess site every shift 02/03/22 2100     Pressure Injury 01/29/22 Coccyx Stage 3 -  Full thickness tissue loss. Subcutaneous fat may be visible but bone, tendon or muscle are NOT exposed. (Active)  01/29/22   Location: Coccyx  Location Orientation:   Staging: Stage 3 -  Full thickness tissue loss. Subcutaneous fat may be visible but bone, tendon or muscle are NOT exposed.  Wound Description (Comments):   Present on Admission: Yes  Dressing Type Foam - Lift dressing to assess site every shift 02/03/22 2100    Vital signs Vitals:   02/03/22 0516 02/03/22 1343 02/03/22 1950 02/04/22 0453  BP: 112/70 100/64 104/60 115/73  Pulse: 93 93 (!) 57 69  Temp: 97.8 F (36.6 C) 98.1 F (36.7 C) 97.9 F (36.6 C) 97.7 F (36.5 C)  Resp: '20 17 14 16  '$ Height:      Weight:      SpO2: 94% 99% 100% 100%  TempSrc: Oral Oral Oral Oral  BMI (Calculated):         Discharge exam  GENERAL: No apparent distress.  Nontoxic. HEENT: MMM.  Vision and hearing grossly intact.  NECK: Supple.  No apparent JVD.  RESP:  No IWOB.  Fair aeration bilaterally. CVS:  RRR. Heart sounds normal.  ABD/GI/GU: BS+. Abd soft, NTND.  Foley catheter in place. MSK/EXT:  Moves extremities. No apparent deformity.  Trace pedal edema bilaterally SKIN: No new stage II-III sacral decubitus. NEURO: Awake and alert. Oriented fairly.  No apparent focal neuro deficit. PSYCH: Calm. Normal affect.   Discharge Instructions Discharge Instructions     Diet - low sodium heart healthy   Complete by:  As directed    With fluid restriction to less than 1500 cc   Diet Carb Modified   Complete by: As directed    Discharge wound care:   Complete by: As directed    Wound care  Every shift      \Wound care to Stage 2 pressure injury, partial thickness skin loss at sacrum: Cleanse with NS, pat dry. Cover with silicone bordered foam, change every 3 days and PRN soiling. Turn patient off of area.   Increase activity slowly   Complete by: As directed       Allergies as of 02/04/2022       Reactions   Nsaids Other (See Comments)  On blood thinners with h/o IDA/bleeding   Prednisone Other (See Comments)   "makes him feel faint", hyperglycemic         Medication List     STOP taking these medications    loperamide 2 MG capsule Commonly known as: IMODIUM   metFORMIN 500 MG tablet Commonly known as: GLUCOPHAGE   metolazone 5 MG tablet Commonly known as: ZAROXOLYN   pioglitazone 45 MG tablet Commonly known as: ACTOS   tiZANidine 2 MG tablet Commonly known as: ZANAFLEX       TAKE these medications    acetaminophen 500 MG tablet Commonly known as: TYLENOL Take 1,000 mg by mouth every 6 (six) hours as needed for moderate pain.   ascorbic acid 500 MG tablet Commonly known as: VITAMIN C Take 500 mg by mouth at bedtime.   aspirin EC 81 MG tablet Take 1 tablet (81 mg total) by mouth daily with breakfast. Swallow whole.   carvedilol 12.5 MG tablet Commonly known as: COREG Take 1 tablet (12.5 mg total) by mouth 2 (two) times daily with a meal.   cyanocobalamin 500 MCG tablet Commonly known as: VITAMIN B12 Take 500 mcg by mouth daily.   feeding supplement Liqd Take 237 mLs by mouth 2 (two) times daily between meals.   FIBER-LAX PO Take 1 capsule by mouth daily.   furosemide 40 MG tablet Commonly known as: LASIX Take 1 tablet (40 mg total) by mouth daily.   glipiZIDE 10 MG 24 hr tablet Commonly known as: GLUCOTROL XL Take 1 tablet (10 mg total) by mouth daily  with breakfast. What changed: when to take this   LORazepam 0.5 MG tablet Commonly known as: ATIVAN Take 1 tablet (0.5 mg total) by mouth at bedtime.   nitroGLYCERIN 0.4 MG SL tablet Commonly known as: NITROSTAT Place 1 tablet (0.4 mg total) under the tongue every 5 (five) minutes as needed.   oxyCODONE 5 MG immediate release tablet Commonly known as: Oxy IR/ROXICODONE Take 0.5-1 tablets (2.5-5 mg total) by mouth every 6 (six) hours as needed for moderate pain, severe pain or breakthrough pain.   polyethylene glycol 17 g packet Commonly known as: MIRALAX / GLYCOLAX Take 17 g by mouth 2 (two) times daily as needed for mild constipation.   rosuvastatin 5 MG tablet Commonly known as: CRESTOR Take 5 mg by mouth every Wednesday.   tamsulosin 0.4 MG Caps capsule Commonly known as: Flomax Take 1 capsule (0.4 mg total) by mouth daily.   witch hazel-glycerin pad Commonly known as: TUCKS Apply topically as needed for irritation or hemorrhoids.   Zinc Oxide 40 % Pste Apply 1 Application topically as needed. Apply to buttock and sacrum if silicone foam is not effective due to incontinence               Discharge Care Instructions  (From admission, onward)           Start     Ordered   02/04/22 0000  Discharge wound care:       Comments: Wound care  Every shift      \Wound care to Stage 2 pressure injury, partial thickness skin loss at sacrum: Cleanse with NS, pat dry. Cover with silicone bordered foam, change every 3 days and PRN soiling. Turn patient off of area.   02/04/22 0804            Consultations: General surgery  Procedures/Studies:   CT ABDOMEN PELVIS W CONTRAST  Addendum Date: 01/28/2022   ADDENDUM REPORT:  01/28/2022 15:49 ADDENDUM: These results were called by telephone at the time of interpretation on 01/28/2022 at 3:48 pm to provider HAYLEY NAASZ , who verbally acknowledged these results. Electronically Signed   By: Zetta Bills M.D.   On:  01/28/2022 15:49   Result Date: 01/28/2022 CLINICAL DATA:  Acute abdominal pain, nonlocalized abdominal pain in an 81 year old. Blood in urine. Post recent rectal biopsy. History of lymphoma. * Tracking Code: BO * EXAM: CT ABDOMEN AND PELVIS WITH CONTRAST TECHNIQUE: Multidetector CT imaging of the abdomen and pelvis was performed using the standard protocol following bolus administration of intravenous contrast. RADIATION DOSE REDUCTION: This exam was performed according to the departmental dose-optimization program which includes automated exposure control, adjustment of the mA and/or kV according to patient size and/or use of iterative reconstruction technique. CONTRAST:  87m OMNIPAQUE IOHEXOL 300 MG/ML  SOLN COMPARISON:  November 28, 2021 FINDINGS: Lower chest: Basilar atelectasis. No effusion or consolidative changes. Calcified coronary artery disease, incompletely evaluated. Small pericardial effusion of similar volume to previous imaging. Hepatobiliary: No focal, suspicious hepatic lesion. No pericholecystic stranding. No biliary duct dilation. Portal vein is patent. Pancreas: Mild atrophy of the pancreas without ductal dilation, inflammation or visible lesion. Spleen: Normal. Adrenals/Urinary Tract: Cortical scarring of the bilateral kidneys without visible lesion or hydronephrosis. Urinary bladder is decompressed with Foley catheter in place. There is substantial perivesical stranding. There is no hydronephrosis. Adrenal glands are normal. Stomach/Bowel: Stomach without signs of adjacent stranding. No small bowel dilation. Colonic diverticulosis. Stranding about the rectum. Perirectal stranding is moderate to marked along the LEFT rectal wall. There is hypoenhancement of the LEFT rectal wall. Stranding extends to the pelvic floor towards the upper margin of the anal sphincter. Integrity of the distal rectum just above the pelvic floor is difficult to assess. Patulous appearance of the distal rectum abutting  the posterior prostate and puborectalis muscle on the LEFT just below the level of greatest stranding. No disseminated gas about the mesorectum. Vascular/Lymphatic: Aortic atherosclerosis. No sign of aneurysm. Smooth contour of the IVC. There is no gastrohepatic or hepatoduodenal ligament lymphadenopathy. No retroperitoneal or mesenteric lymphadenopathy. No pelvic sidewall lymphadenopathy. Reproductive: Prostate unremarkable by CT. Other: No pneumoperitoneum.  No ascites. Musculoskeletal: Levoconvex curvature of the thoracic/lumbar spine. Degenerative changes of the spine. No focal destructive bone lesion. IMPRESSION: 1. Perivesical stranding raising concern for cystitis. Correlate with urinalysis. This is associated with bladder wall thickening as well. 2. Patulous appearance of the distal rectum falling polypectomy versus is contained rectal wall violation/perforation following polypectomy associated with abundant stranding in the mesorectum. There is no disseminated gas or focal fluid. There is abundant stranding above the site in the pelvis. The somewhat angulated appearance of gas which abuts the posterior prostate and puborectalis muscle raises the question of violation of the rectal wall. Close follow-up is suggested with consideration for water-soluble contrast enema performed other with fluoroscopy or CT. Based on low position of the abnormality just above the sphincter complex would suggest utilization of Foley catheter rather than larger rectal tube if this is performed. 3. Colonic diverticulosis without evidence of acute diverticulitis. 4. Small pericardial effusion of similar volume to previous imaging. 5. Calcified coronary artery disease, incompletely evaluated. 6. Aortic atherosclerosis. Electronically Signed: By: GZetta BillsM.D. On: 01/28/2022 15:39   DG Chest 2 View  Result Date: 01/11/2022 CLINICAL DATA:  Dyspnea EXAM: CHEST - 2 VIEW COMPARISON:  01/08/2022 FINDINGS: Stable cardiomegaly.  Aortic atherosclerosis. Mild diffuse interstitial prominence, similar to prior. No pleural effusion or  pneumothorax. IMPRESSION: Cardiomegaly and mild edema, similar to prior. Electronically Signed   By: Davina Poke D.O.   On: 01/11/2022 11:29   DG Chest Port 1 View  Result Date: 01/08/2022 CLINICAL DATA:  Weakness, left leg swelling EXAM: PORTABLE CHEST 1 VIEW COMPARISON:  Radiograph 11/27/2021 FINDINGS: Unchanged enlarged cardiac silhouette. Aortic arch calcifications. There are mild diffuse interstitial opacities. There is no pleural effusion. No pneumothorax. Thoracic spondylosis with dextroconvex curvature. Severe bilateral glenohumeral osteoarthritis. IMPRESSION: Cardiomegaly with interstitial pulmonary edema. Electronically Signed   By: Maurine Simmering M.D.   On: 01/08/2022 12:04   US Venous Img Lower Bilateral (DVT)  Result Date: 01/08/2022 CLINICAL DATA:  Bilateral lower extremity edema for 1 month. EXAM: Bilateral LOWER EXTREMITY VENOUS DOPPLER ULTRASOUND TECHNIQUE: Gray-scale sonography with compression, as well as color and duplex ultrasound, were performed to evaluate the deep venous system(s) from the level of the common femoral vein through the popliteal and proximal calf veins. COMPARISON:  No recent comparison imaging. Imaging from 2009 is available. FINDINGS: VENOUS Normal compressibility of the common femoral, superficial femoral, and popliteal veins, as well as the visualized calf veins. Visualized portions of profunda femoral vein and great saphenous vein unremarkable. No filling defects to suggest DVT on grayscale or color Doppler imaging. Doppler waveforms show normal direction of venous flow, normal respiratory plasticity and response to augmentation. OTHER Edema in the lower leg bilaterally. Limitations: Limited assessment of bilateral calf veins due to presence of lower extremity, lower leg edema. IMPRESSION: Negative for DVT. Mildly limited with respect to calf vein assessment due  to lower leg edema. Electronically Signed   By: Zetta Bills M.D.   On: 01/08/2022 11:45       The results of significant diagnostics from this hospitalization (including imaging, microbiology, ancillary and laboratory) are listed below for reference.     Microbiology: Recent Results (from the past 240 hour(s))  Remove and replace urinary cath (placed > 5 days) then obtain urine culture from new indwelling urinary catheter.     Status: Abnormal   Collection Time: 01/28/22 10:05 PM   Specimen: Urine, Catheterized  Result Value Ref Range Status   Specimen Description   Final    URINE, CATHETERIZED Performed at [facility], [address], [place], [place] [address]    Special Requests   Final    NONE Performed at [facility], [address] [place], [place] [address]    Culture 80,000 COLONIES/mL ESCHERICHIA COLI (A)  Final   Report Status 01/31/2022 FINAL  Final   Organism ID, Bacteria ESCHERICHIA COLI (A)  Final      Susceptibility   Escherichia coli - MIC*    AMPICILLIN <=2 SENSITIVE Sensitive     CEFAZOLIN <=4 SENSITIVE Sensitive     CEFEPIME <=0.12 SENSITIVE Sensitive     CEFTRIAXONE <=0.25 SENSITIVE Sensitive     CIPROFLOXACIN <=0.25 SENSITIVE Sensitive     GENTAMICIN <=1 SENSITIVE Sensitive     IMIPENEM <=0.25 SENSITIVE Sensitive     NITROFURANTOIN <=16 SENSITIVE Sensitive     TRIMETH/SULFA <=20 SENSITIVE Sensitive     AMPICILLIN/SULBACTAM <=2 SENSITIVE Sensitive     PIP/TAZO <=4 SENSITIVE Sensitive     * 80,000 COLONIES/mL ESCHERICHIA COLI  Culture, blood (Routine X 2) w Reflex to ID Panel     Status: None   Collection Time: 01/28/22 10:37 PM   Specimen: Left Antecubital; Blood  Result Value Ref Range Status   Specimen Description LEFT ANTECUBITAL  Final   Special Requests  Final    BOTTLES DRAWN AEROBIC AND ANAEROBIC Blood Culture adequate volume   Culture   Final    NO GROWTH 5 DAYS Performed at [facility], [address], [place], [place] [address]     Report Status 02/02/2022 FINAL  Final  Culture, blood (Routine X 2) w Reflex to ID Panel     Status: None   Collection Time: 01/28/22 10:37 PM   Specimen: BLOOD RIGHT HAND  Result Value Ref Range Status   Specimen Description BLOOD RIGHT HAND  Final   Special Requests   Final    BOTTLES DRAWN AEROBIC AND ANAEROBIC Blood Culture adequate volume   Culture   Final    NO GROWTH 5 DAYS Performed at [facility], [address] [place], [place] [address]    Report Status 02/02/2022 FINAL  Final  Urine Culture     Status: None   Collection Time: 01/29/22  7:51 AM   Specimen: Urine, Clean Catch  Result Value Ref Range Status   Specimen Description   Final    URINE, CLEAN CATCH Performed at [facility], [address] [place], [place] [address]    Special Requests   Final    NONE Performed at [facility], [address], [place], [place] [address]    Culture   Final    NO GROWTH Performed at [facility], [place] [address], [place], [place] [address]    Report Status 01/30/2022 FINAL  Final     Labs:  CBC: Recent Labs  Lab 01/28/22 1008 01/28/22 1337 01/29/22 0459 01/30/22 0555 01/31/22 0602 02/01/22 0804 02/02/22 0445 02/03/22 0521  WBC 14.8* 15.3*   < > 10.5 10.0 10.1 8.5 10.5  NEUTROABS 10.9* 11.2*  --   --   --   --   --   --   HGB 9.3* 9.6*   < > 9.9* 9.1* 8.9* 9.3* 10.1*  HCT 26.9* 28.1*   < > 30.1* 27.6* 27.0* 27.8* 30.4*  MCV 89.1 89.2   < > 90.9 91.1 90.9 90.8 91.0  PLT 387 392   < > 422* 393 385 382 384   < > = values in this interval not displayed.   BMP &GFR Recent Labs  Lab 01/28/22 1337 01/29/22 0459 01/30/22 0555 01/31/22 0602 02/01/22 0804 02/02/22 0445 02/03/22 0521  NA 122*   < > 130* 129* 128* 127* 130*  K 4.6   < > 4.0 4.0 3.6 3.7 4.5  CL 86*   < > 91* 91* 89* 90* 90*  CO2 24   < > '29 27 31 31 31  '$ GLUCOSE 169*   < > 114* 111* 135* 123* 130*  BUN 52*   < > 43* 46* 54* 53* 59*  CREATININE 1.91*   < > 1.75* 1.79* 1.71* 1.90* 1.93*   CALCIUM 8.2*   < > 8.5* 8.2* 8.3* 8.0* 8.6*  MG 1.9  --   --  1.5* 2.0 1.9 2.0  PHOS  --   --   --  3.8 3.5 3.5 3.6   < > = values in this interval not displayed.   Estimated Creatinine Clearance: 31.8 mL/min (A) (by C-G formula based on SCr of 1.93 mg/dL (H)). Liver & Pancreas: Recent Labs  Lab 01/28/22 1008 01/28/22 1337 01/31/22 0602 02/01/22 0804 02/02/22 0445 02/03/22 0521  AST 21 22  --   --   --   --   ALT 17 16  --   --   --   --  ALKPHOS 81 81  --   --   --   --   BILITOT 0.7 0.8  --   --   --   --   PROT 6.3* 6.6  --   --   --   --   ALBUMIN 2.4* 2.4* 2.2* 2.2* 2.1* 2.3*   No results for input(s): "LIPASE", "AMYLASE" in the last 168 hours. No results for input(s): "AMMONIA" in the last 168 hours. Diabetic: No results for input(s): "HGBA1C" in the last 72 hours. Recent Labs  Lab 02/03/22 0517 02/03/22 1149 02/03/22 1718 02/03/22 2136 02/04/22 0751  GLUCAP 117* 284* 281* 162* 176*   Cardiac Enzymes: No results for input(s): "CKTOTAL", "CKMB", "CKMBINDEX", "TROPONINI" in the last 168 hours. No results for input(s): "PROBNP" in the last 8760 hours. Coagulation Profile: No results for input(s): "INR", "PROTIME" in the last 168 hours. Thyroid Function Tests: No results for input(s): "TSH", "T4TOTAL", "FREET4", "T3FREE", "THYROIDAB" in the last 72 hours. Lipid Profile: No results for input(s): "CHOL", "HDL", "LDLCALC", "TRIG", "CHOLHDL", "LDLDIRECT" in the last 72 hours. Anemia Panel: No results for input(s): "VITAMINB12", "FOLATE", "FERRITIN", "TIBC", "IRON", "RETICCTPCT" in the last 72 hours. Urine analysis:    Component Value Date/Time   COLORURINE YELLOW 01/28/2022 1455   APPEARANCEUR HAZY (A) 01/28/2022 1455   LABSPEC 1.010 01/28/2022 1455   PHURINE 5.0 01/28/2022 1455   GLUCOSEU NEGATIVE 01/28/2022 1455   HGBUR MODERATE (A) 01/28/2022 1455   BILIRUBINUR NEGATIVE 01/28/2022 1455   KETONESUR NEGATIVE 01/28/2022 1455   PROTEINUR NEGATIVE 01/28/2022  1455   UROBILINOGEN 0.2 12/26/2008 0950   NITRITE NEGATIVE 01/28/2022 1455   LEUKOCYTESUR LARGE (A) 01/28/2022 1455   Sepsis Labs: Invalid input(s): "PROCALCITONIN", "LACTICIDVEN"   SIGNED:  Mercy Riding, MD  [facility] 02/04/2022, 8:09 AM

## 2022-02-04 NOTE — Consult Note (Addendum)
   [facility] Inpatient Consult   02/04/2022  Bradley Sheppard 1940/07/11 [id number]  [employer] [ACO] Patient: [employer] PPO  Primary Care Provider:  [name], MD  [facility] Liaison remote coverage review for [facility]   Addendum:  Readmission less than 30 days list  Patient's electronic medical record was reviewed for transitional needs for patient with extreme high risk score for unplanned readmission risk noted. Marland Kitchen However, patient is returning to a skilled nursing facility level of care showing for [facility] with insurance approval noted.  Plan:   This patient's transition of care needs is for a skilled nursing level of care. No [facility] CM follow up at this facility in this insurance plan needed.  For questions or referrals, please contact:   Natividad Brood, RN BSN [place]  [phone] business mobile phone Toll free office [phone]  *[place]  [phone] Fax number: [phone] [email or web address]

## 2022-02-04 NOTE — TOC Transition Note (Signed)
Transition of Care [facility]) - CM/SW Discharge Note   Patient Details  Name: Bradley Sheppard MRN: [id number] Date of Birth: 03/03/1941  Transition of Care [facility]) CM/SW Contact:  Vassie Moselle, LCSW Phone Number: 02/04/2022, 11:45 AM   Clinical Narrative:    Pt is to transfer to [facility] for SNF. Pt will be going to room 155. RN to call report to [phone]. [facility] has been called for transportation.    Final next level of care: Skilled Nursing Facility Barriers to Discharge: Barriers Resolved   Patient Goals and CMS Choice Patient states their goals for this hospitalization and ongoing recovery are:: get better CMS Medicare.gov Compare Post Acute Care list provided to:: Patient Represenative (must comment) Choice offered to / list presented to : Spouse  Discharge Placement   Existing PASRR number confirmed : 01/30/22          Patient chooses bed at: Other - please specify in the comment section below: [facility]) Patient to be transferred to facility by: [facility] Name of family member notified: Wife, Peter [place] Patient and family notified of of transfer: 02/04/22  Discharge Plan and Services In-house Referral: Clinical Social Work Discharge Planning Services: CM Consult            DME Arranged: N/A DME Agency: NA                  Social Determinants of Health ([place]) Interventions     Readmission Risk Interventions    02/04/2022    9:13 AM 01/29/2022    1:26 PM 01/10/2022    8:53 AM  Readmission Risk Prevention Plan  Transportation Screening Complete Complete Complete  PCP or Specialist Appt within 3-5 Days   Not Complete  HRI or [place]   Complete  Social Work Consult for [place] Planning/Counseling   [place]   Not Complete  Medication Review Press photographer) Complete Complete Complete  PCP or Specialist appointment within 3-5 days of discharge Complete    HRI or [place] Complete Complete   SW  Recovery Care/Counseling Consult Complete Complete   Palliative Care Screening Not Applicable Not [place] Complete Not Applicable

## 2022-02-05 DIAGNOSIS — I10 Essential (primary) hypertension: Secondary | ICD-10-CM | POA: Diagnosis not present

## 2022-02-05 DIAGNOSIS — Z299 Encounter for prophylactic measures, unspecified: Secondary | ICD-10-CM | POA: Diagnosis not present

## 2022-02-05 DIAGNOSIS — E1122 Type 2 diabetes mellitus with diabetic chronic kidney disease: Secondary | ICD-10-CM | POA: Diagnosis not present

## 2022-02-05 DIAGNOSIS — C884 Extranodal marginal zone B-cell lymphoma of mucosa-associated lymphoid tissue [MALT-lymphoma]: Secondary | ICD-10-CM | POA: Diagnosis not present

## 2022-02-05 DIAGNOSIS — N1831 Chronic kidney disease, stage 3a: Secondary | ICD-10-CM | POA: Diagnosis not present

## 2022-02-05 DIAGNOSIS — L89152 Pressure ulcer of sacral region, stage 2: Secondary | ICD-10-CM | POA: Diagnosis not present

## 2022-02-09 ENCOUNTER — Ambulatory Visit (INDEPENDENT_AMBULATORY_CARE_PROVIDER_SITE_OTHER): Payer: PPO | Admitting: Physician Assistant

## 2022-02-09 VITALS — BP 90/52 | HR 71 | Ht 72.0 in | Wt 195.0 lb

## 2022-02-09 DIAGNOSIS — N390 Urinary tract infection, site not specified: Secondary | ICD-10-CM

## 2022-02-09 DIAGNOSIS — T83511D Infection and inflammatory reaction due to indwelling urethral catheter, subsequent encounter: Secondary | ICD-10-CM

## 2022-02-09 DIAGNOSIS — L89309 Pressure ulcer of unspecified buttock, unspecified stage: Secondary | ICD-10-CM | POA: Diagnosis not present

## 2022-02-09 DIAGNOSIS — R339 Retention of urine, unspecified: Secondary | ICD-10-CM

## 2022-02-09 DIAGNOSIS — Z87440 Personal history of urinary (tract) infections: Secondary | ICD-10-CM

## 2022-02-09 NOTE — Progress Notes (Unsigned)
Assessment: There are no diagnoses linked to this encounter.   Plan: ***  No orders of the defined types were placed in this encounter.    Chief Complaint: No chief complaint on file.   HPI: Bradley Sheppard is a 81 y.o. male who presents for continued evaluation of ***.  UA= PVR= IPSS=           QOL=  Portions of the above documentation were copied from a prior visit for review purposes only.  Allergies: Allergies  Allergen Reactions   Nsaids Other (See Comments)    On blood thinners with h/o IDA/bleeding   Prednisone Other (See Comments)    "makes him feel faint", hyperglycemic     PMH: Past Medical History:  Diagnosis Date   (HFpEF) heart failure with preserved ejection fraction (HCC)    Anxiety    Aortic stenosis    Arthritis    Asthma    Chronic pain    CKD (chronic kidney disease) stage 3, GFR 30-59 ml/min (HCC)    Coronary atherosclerosis of native coronary artery    Multvessel, DES to LAD and RCA 8/03; EF 65% by echo 11/2014   DM2 (diabetes mellitus, type 2) (HCC)    Essential hypertension    Gastric ulcer    Gastritis    Related to NSAIDs   GERD (gastroesophageal reflux disease)    Iron deficiency anemia    Mitral valve regurgitation    Mixed hyperlipidemia    Myocardial infarction ([place])    Nephrolithiasis     PSH: Past Surgical History:  Procedure Laterality Date   APPENDECTOMY     BIOPSY  09/25/2021   Procedure: BIOPSY;  Surgeon: Daneil Dolin, MD;  [facility];  Service: Endoscopy;;   CARDIAC CATHETERIZATION  01/2002   90% obstruction in proximal LAD, 60 % obstruction in proximal circumflex and 70%  in proximal RCA. LAD & RCA stented  stents x 2   COLONOSCOPY WITH PROPOFOL N/A 09/25/2021   Procedure: COLONOSCOPY WITH PROPOFOL;  Surgeon: Daneil Dolin, MD;  [facility];  Service: Endoscopy;  Laterality: N/A;  11:00am   L knee replacement  2009   Subsequent infectino requiring resectino arthroplasty with  incision and drainage 8/10, and reimplantizathion arthroplasty, 9/20. 5 total surgeries.   PARTIAL PROCTECTOMY BY TEM N/A 12/31/2021   Procedure: TEM PARTIAL PROTECTOMY;  Surgeon: Michael Boston, MD;  Location: [facility];  Service: General;  Laterality: N/A;    SH: Social History   Tobacco Use   Smoking status: Former    Types: Cigarettes    Passive exposure: Never   Smokeless tobacco: Current    Types: Chew  Vaping Use   Vaping Use: Never used  Substance Use Topics   Alcohol use: No   Drug use: No    ROS: All other review of systems were reviewed and are negative except what is noted above in HPI  PE: BP (!) 90/52   Pulse 71   Ht 6' (1.829 m)   BMI 25.63 kg/m  GENERAL APPEARANCE:  Well appearing, well developed, well nourished, NAD HEENT:  Atraumatic, normocephalic NECK:  Supple. Trachea midline ABDOMEN:  Soft, non-tender, no masses EXTREMITIES:  Moves all extremities well, without clubbing, cyanosis, or edema NEUROLOGIC:  Alert and oriented x 3, normal gait, CN II-XII grossly intact MENTAL STATUS:  appropriate BACK:  Non-tender to palpation, No CVAT SKIN:  Warm, dry, and intact   Results: Laboratory Data: Lab Results  Component Value Date  WBC 10.5 02/03/2022   HGB 10.1 (L) 02/03/2022   HCT 30.4 (L) 02/03/2022   MCV 91.0 02/03/2022   PLT 384 02/03/2022    Lab Results  Component Value Date   CREATININE 1.93 (H) 02/03/2022    No results found for: "PSA"  No results found for: "TESTOSTERONE"  Lab Results  Component Value Date   HGBA1C 7.2 (H) 12/31/2021    Urinalysis    Component Value Date/Time   COLORURINE YELLOW 01/28/2022 1455   APPEARANCEUR HAZY (A) 01/28/2022 1455   LABSPEC 1.010 01/28/2022 1455   PHURINE 5.0 01/28/2022 1455   GLUCOSEU NEGATIVE 01/28/2022 1455   HGBUR MODERATE (A) 01/28/2022 1455   BILIRUBINUR NEGATIVE 01/28/2022 1455   KETONESUR NEGATIVE 01/28/2022 1455   PROTEINUR NEGATIVE 01/28/2022 1455   UROBILINOGEN 0.2 12/26/2008  0950   NITRITE NEGATIVE 01/28/2022 1455   LEUKOCYTESUR LARGE (A) 01/28/2022 1455    Lab Results  Component Value Date   BACTERIA FEW (A) 01/28/2022    Pertinent Imaging: No results found for this or any previous visit.  Results for orders placed during the hospital encounter of 01/08/22  US Venous Img Lower Bilateral (DVT)  Narrative CLINICAL DATA:  Bilateral lower extremity edema for 1 month.  EXAM: Bilateral LOWER EXTREMITY VENOUS DOPPLER ULTRASOUND  TECHNIQUE: Gray-scale sonography with compression, as well as color and duplex ultrasound, were performed to evaluate the deep venous system(s) from the level of the common femoral vein through the popliteal and proximal calf veins.  COMPARISON:  No recent comparison imaging. Imaging from 2009 is available.  FINDINGS: VENOUS  Normal compressibility of the common femoral, superficial femoral, and popliteal veins, as well as the visualized calf veins. Visualized portions of profunda femoral vein and great saphenous vein unremarkable. No filling defects to suggest DVT on grayscale or color Doppler imaging. Doppler waveforms show normal direction of venous flow, normal respiratory plasticity and response to augmentation.  OTHER  Edema in the lower leg bilaterally.  Limitations: Limited assessment of bilateral calf veins due to presence of lower extremity, lower leg edema.  IMPRESSION: Negative for DVT. Mildly limited with respect to calf vein assessment due to lower leg edema.   Electronically Signed By: Zetta Bills M.D. On: 01/08/2022 11:45  No results found for this or any previous visit.  No results found for this or any previous visit.  No results found for this or any previous visit.  No valid procedures specified. No results found for this or any previous visit.  No results found for this or any previous visit.  No results found for this or any previous visit (from the past 24 hour(s)).

## 2022-02-10 DIAGNOSIS — I10 Essential (primary) hypertension: Secondary | ICD-10-CM | POA: Diagnosis not present

## 2022-02-10 DIAGNOSIS — R42 Dizziness and giddiness: Secondary | ICD-10-CM | POA: Diagnosis not present

## 2022-02-10 DIAGNOSIS — Z299 Encounter for prophylactic measures, unspecified: Secondary | ICD-10-CM | POA: Diagnosis not present

## 2022-02-10 DIAGNOSIS — F1320 Sedative, hypnotic or anxiolytic dependence, uncomplicated: Secondary | ICD-10-CM | POA: Diagnosis not present

## 2022-02-19 DIAGNOSIS — I10 Essential (primary) hypertension: Secondary | ICD-10-CM | POA: Diagnosis not present

## 2022-02-19 DIAGNOSIS — C884 Extranodal marginal zone B-cell lymphoma of mucosa-associated lymphoid tissue [MALT-lymphoma]: Secondary | ICD-10-CM | POA: Diagnosis not present

## 2022-02-19 DIAGNOSIS — L89152 Pressure ulcer of sacral region, stage 2: Secondary | ICD-10-CM | POA: Diagnosis not present

## 2022-02-19 DIAGNOSIS — E1165 Type 2 diabetes mellitus with hyperglycemia: Secondary | ICD-10-CM | POA: Diagnosis not present

## 2022-02-19 DIAGNOSIS — Z299 Encounter for prophylactic measures, unspecified: Secondary | ICD-10-CM | POA: Diagnosis not present

## 2022-02-24 DIAGNOSIS — Z7984 Long term (current) use of oral hypoglycemic drugs: Secondary | ICD-10-CM | POA: Diagnosis not present

## 2022-02-24 DIAGNOSIS — I252 Old myocardial infarction: Secondary | ICD-10-CM | POA: Diagnosis not present

## 2022-02-24 DIAGNOSIS — F419 Anxiety disorder, unspecified: Secondary | ICD-10-CM | POA: Diagnosis not present

## 2022-02-24 DIAGNOSIS — Z87891 Personal history of nicotine dependence: Secondary | ICD-10-CM | POA: Diagnosis not present

## 2022-02-24 DIAGNOSIS — Z886 Allergy status to analgesic agent status: Secondary | ICD-10-CM | POA: Diagnosis not present

## 2022-02-24 DIAGNOSIS — I4891 Unspecified atrial fibrillation: Secondary | ICD-10-CM | POA: Diagnosis not present

## 2022-02-24 DIAGNOSIS — N1831 Chronic kidney disease, stage 3a: Secondary | ICD-10-CM | POA: Diagnosis not present

## 2022-02-24 DIAGNOSIS — Z888 Allergy status to other drugs, medicaments and biological substances status: Secondary | ICD-10-CM | POA: Diagnosis not present

## 2022-02-24 DIAGNOSIS — I509 Heart failure, unspecified: Secondary | ICD-10-CM | POA: Diagnosis not present

## 2022-02-24 DIAGNOSIS — L89312 Pressure ulcer of right buttock, stage 2: Secondary | ICD-10-CM | POA: Diagnosis not present

## 2022-02-24 DIAGNOSIS — I130 Hypertensive heart and chronic kidney disease with heart failure and stage 1 through stage 4 chronic kidney disease, or unspecified chronic kidney disease: Secondary | ICD-10-CM | POA: Diagnosis not present

## 2022-02-24 DIAGNOSIS — Z7982 Long term (current) use of aspirin: Secondary | ICD-10-CM | POA: Diagnosis not present

## 2022-02-24 DIAGNOSIS — Z299 Encounter for prophylactic measures, unspecified: Secondary | ICD-10-CM | POA: Diagnosis not present

## 2022-02-24 DIAGNOSIS — N189 Chronic kidney disease, unspecified: Secondary | ICD-10-CM | POA: Diagnosis not present

## 2022-02-24 DIAGNOSIS — E1122 Type 2 diabetes mellitus with diabetic chronic kidney disease: Secondary | ICD-10-CM | POA: Diagnosis not present

## 2022-02-24 DIAGNOSIS — E7800 Pure hypercholesterolemia, unspecified: Secondary | ICD-10-CM | POA: Diagnosis not present

## 2022-02-24 DIAGNOSIS — I2510 Atherosclerotic heart disease of native coronary artery without angina pectoris: Secondary | ICD-10-CM | POA: Diagnosis not present

## 2022-02-24 DIAGNOSIS — L89152 Pressure ulcer of sacral region, stage 2: Secondary | ICD-10-CM | POA: Diagnosis not present

## 2022-02-25 ENCOUNTER — Inpatient Hospital Stay: Payer: PPO | Attending: Hematology | Admitting: Cardiology

## 2022-02-25 ENCOUNTER — Encounter: Payer: Self-pay | Admitting: Cardiology

## 2022-02-25 VITALS — BP 102/60 | HR 74 | Ht 72.0 in | Wt 185.4 lb

## 2022-02-25 DIAGNOSIS — I25119 Atherosclerotic heart disease of native coronary artery with unspecified angina pectoris: Secondary | ICD-10-CM

## 2022-02-25 DIAGNOSIS — I350 Nonrheumatic aortic (valve) stenosis: Secondary | ICD-10-CM | POA: Diagnosis not present

## 2022-02-25 DIAGNOSIS — I480 Paroxysmal atrial fibrillation: Secondary | ICD-10-CM

## 2022-02-25 DIAGNOSIS — N1832 Chronic kidney disease, stage 3b: Secondary | ICD-10-CM

## 2022-02-25 NOTE — Patient Instructions (Signed)
Medication Instructions:   Your physician recommends that you continue on your current medications as directed. Please refer to the Current Medication list given to you today.  Labwork:  none  Testing/Procedures:  none  Follow-Up:  Your physician recommends that you schedule a follow-up appointment in: 3 months.  Any Other Special Instructions Will Be Listed Below (If Applicable).  If you need a refill on your cardiac medications before your next appointment, please call your pharmacy. 

## 2022-02-25 NOTE — Progress Notes (Signed)
Cardiology Office Note  Date: 02/25/2022   ID: Bradley, Sheppard May 31, 1940, MRN [id number]  PCP:  [name], MD  Cardiologist:  Rozann Lesches, MD Electrophysiologist:  None   Chief Complaint  Patient presents with   Cardiac follow-up    History of Present Illness: Bradley Sheppard is an 81 y.o. male last seen in August.  I reviewed interval chart.  He underwent partial proctectomy by transanal endoscopic microsurgery on 12/31/21, subsequently with chronic urinary catheter and diagnosis of Hodgkin's lymphoma.  Admitted more recently with weakness and hyponatremia as well as leukocytosis and concern for cystitis as well as possible rectal perforation.  He was treated with antibiotics and seen by the surgical team although surgical intervention was not felt to be indicated.  He is here today for follow-up, continues with physical therapy at the [facility].  Upper body strength better, but he still has progress to make in terms of leg weakness by his description.  He is in a wheelchair today.  Reportedly heart rate was elevated this morning per vital signs at the facility.  His heart rate is in the 70s to 80s today with irregular rate consistent with known atrial fibrillation.  He does not feel any sense of palpitations.  I reviewed his medications which are outlined below.  Past Medical History:  Diagnosis Date   (HFpEF) heart failure with preserved ejection fraction (HCC)    Anxiety    Aortic stenosis    Arthritis    Asthma    Chronic pain    CKD (chronic kidney disease) stage 3, GFR 30-59 ml/min (HCC)    Coronary atherosclerosis of native coronary artery    Multvessel, DES to LAD and RCA 8/03; EF 65% by echo 11/2014   DM2 (diabetes mellitus, type 2) ([place])    Essential hypertension    Gastric ulcer    Gastritis    Related to NSAIDs   GERD (gastroesophageal reflux disease)    Iron deficiency anemia    Mitral valve regurgitation    Mixed  hyperlipidemia    Myocardial infarction [facility])    Nephrolithiasis     Past Surgical History:  Procedure Laterality Date   APPENDECTOMY     BIOPSY  09/25/2021   Procedure: BIOPSY;  Surgeon: Daneil Dolin, MD;  [facility];  Service: Endoscopy;;   CARDIAC CATHETERIZATION  01/2002   90% obstruction in proximal LAD, 60 % obstruction in proximal circumflex and 70%  in proximal RCA. LAD & RCA stented  stents x 2   COLONOSCOPY WITH PROPOFOL N/A 09/25/2021   Procedure: COLONOSCOPY WITH PROPOFOL;  Surgeon: Daneil Dolin, MD;  [facility];  Service: Endoscopy;  Laterality: N/A;  11:00am   L knee replacement  2009   Subsequent infectino requiring resectino arthroplasty with incision and drainage 8/10, and reimplantizathion arthroplasty, 9/20. 5 total surgeries.   PARTIAL PROCTECTOMY BY TEM N/A 12/31/2021   Procedure: TEM PARTIAL PROTECTOMY;  Surgeon: Michael Boston, MD;  Location[facility];  Service: General;  Laterality: N/A;    Current Outpatient Medications  Medication Sig Dispense Refill   acetaminophen (TYLENOL) 500 MG tablet Take 1,000 mg by mouth every 6 (six) hours as needed for moderate pain.     aspirin EC 81 MG tablet Take 1 tablet (81 mg total) by mouth daily with breakfast. Swallow whole. 30 tablet 12   Calcium Polycarbophil (FIBER-LAX PO) Take 1 capsule by mouth daily.     carvedilol (  COREG) 12.5 MG tablet Take 1 tablet (12.5 mg total) by mouth 2 (two) times daily with a meal. 60 tablet 2   feeding supplement (ENSURE SURGERY) LIQD Take 237 mLs by mouth 2 (two) times daily between meals.     furosemide (LASIX) 40 MG tablet Take 1 tablet (40 mg total) by mouth daily. 30 tablet 1   glipiZIDE (GLUCOTROL XL) 10 MG 24 hr tablet Take 1 tablet (10 mg total) by mouth daily with breakfast.     LORazepam (ATIVAN) 0.5 MG tablet Take 1 tablet (0.5 mg total) by mouth at bedtime. 10 tablet 0   nitroGLYCERIN (NITROSTAT) 0.4 MG SL tablet Place 1 tablet (0.4 mg total) under the  tongue every 5 (five) minutes as needed. 25 tablet 3   oxyCODONE (OXY IR/ROXICODONE) 5 MG immediate release tablet Take 0.5-1 tablets (2.5-5 mg total) by mouth every 6 (six) hours as needed for moderate pain, severe pain or breakthrough pain. 15 tablet 0   polyethylene glycol (MIRALAX / GLYCOLAX) 17 g packet Take 17 g by mouth 2 (two) times daily as needed for mild constipation. 14 each 0   rosuvastatin (CRESTOR) 5 MG tablet Take 5 mg by mouth every Wednesday.     tamsulosin (FLOMAX) 0.4 MG CAPS capsule Take 1 capsule (0.4 mg total) by mouth daily. 20 capsule 1   vitamin B-12 (CYANOCOBALAMIN) 500 MCG tablet Take 500 mcg by mouth daily.     vitamin C (ASCORBIC ACID) 500 MG tablet Take 500 mg by mouth at bedtime.     witch hazel-glycerin (TUCKS) pad Apply topically as needed for irritation or hemorrhoids. 40 each 12   Zinc Oxide 40 % PSTE Apply 1 Application topically as needed. Apply to buttock and sacrum if silicone foam is not effective due to incontinence 453 g 1   No current facility-administered medications for this visit.   Allergies:  Nsaids and Prednisone   ROS: Chronic hearing loss.  Physical Exam: VS:  BP 102/60   Pulse 74   Ht 6' (1.829 m)   Wt 185 lb 6.4 oz (84.1 kg) Comment: weighed at [facility] 02/09/22  SpO2 99%   BMI 25.14 kg/m , BMI Body mass index is 25.14 kg/m.  Wt Readings from Last 3 Encounters:  02/25/22 185 lb 6.4 oz (84.1 kg)  02/09/22 195 lb (88.5 kg)  01/28/22 189 lb (85.7 kg)    General: Patient appears comfortable at rest. HEENT: Conjunctiva and lids normal. Neck: Supple, no elevated JVP or carotid bruits. Lungs: Clear to auscultation, nonlabored breathing at rest. Cardiac: Irregularly irregular, no S3, 2/6 systolic murmur. Extremities: Improved leg edema compared to last assessment.  No pitting.  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  Recent Labwork: 01/28/2022: ALT  16; AST 22 02/03/2022: B Natriuretic Peptide 545.6; BUN 59; Creatinine, Ser 1.93; Hemoglobin 10.1; Magnesium 2.0; Platelets 384; Potassium 4.5; Sodium 130   Other Studies Reviewed Today:  Echocardiogram 11/24/2021:  1. Left ventricular ejection fraction, by estimation, is 65 to 70%. The  left ventricle has normal function. The left ventricle has no regional  wall motion abnormalities. There is moderate concentric left ventricular  hypertrophy. Left ventricular  diastolic parameters are consistent with Grade II diastolic dysfunction  (pseudonormalization). Elevated left ventricular end-diastolic pressure.   2. Right ventricular systolic function is normal. The right ventricular  size is normal. Tricuspid regurgitation signal is inadequate for assessing  PA pressure.   3. Left  atrial size was severely dilated.   4. Right atrial size was moderately dilated.   5. A small pericardial effusion is present. The pericardial effusion is  posterior to the left ventricle and anterior to the right ventricle. There  is no evidence of cardiac tamponade.   6. The mitral valve is degenerative. Trivial mitral valve regurgitation.   7. The aortic valve is tricuspid with restricted motion of the left  coronary cusp. There is moderate calcification of the aortic valve.  Moderate aortic valve stenosis. Aortic valve mean gradient measures 18.0  mmHg. Dimentionless index 0.44.   8. The inferior vena cava is dilated in size with >50% respiratory  variability, suggesting right atrial pressure of 8 mmHg.   Assessment and Plan:  1.  HFpEF with LVEF 65 to 70% by echocardiogram in July.  Moderate diastolic dysfunction with increased filling pressures, RV contraction normal.  Leg edema is significantly improved, his weight is down about 10 pounds from earlier in the month.  Not on SGLT2 inhibitor at this time due to increased infection risk and immobility.  Continue baseline therapy including Coreg and Lasix.  2.   Paroxysmal atrial fibrillation.  CHA2DS2-VASc score is 5.  Arrhythmia noted during hospitalization back in September.  Has not been a good candidate for anticoagulation however in light of recurrent rectal bleeding and his other active comorbidities.  Continue Coreg for heart rate control, may need to adjust to Toprol-XL depending on blood pressure, particularly if he needs an up titration of beta-blocker dose.  3.  Aortic stenosis, degenerative/calcific and in moderate range by echocardiogram in July with mean gradient 18 mmHg and dimensionless index 0.44.  Continue observation.  4.  CAD status post DES to the LAD and RCA in 2003.  No active angina with current level of activity.  Currently on low-dose aspirin, also Crestor and as needed nitroglycerin.  5.  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  Medication Adjustments/Labs and Tests Ordered: Current medicines are reviewed at length with the patient today.  Concerns regarding medicines are outlined above.   Tests Ordered: No orders of the defined types were placed in this encounter.   Medication Changes: No orders of the defined types were placed in this encounter.   Disposition:  Follow up  3 months.  Signed, Satira Sark, MD, [facility] 02/25/2022 3:31 PM    [place] [facility] [place], [place], [place] [address] Phone: [phone]; Fax: [phone]

## 2022-02-26 ENCOUNTER — Encounter: Payer: Self-pay | Admitting: Urology

## 2022-02-26 ENCOUNTER — Ambulatory Visit (INDEPENDENT_AMBULATORY_CARE_PROVIDER_SITE_OTHER): Payer: PPO | Admitting: Urology

## 2022-02-26 VITALS — BP 91/63 | HR 47 | Ht 72.0 in | Wt 195.0 lb

## 2022-02-26 DIAGNOSIS — R339 Retention of urine, unspecified: Secondary | ICD-10-CM | POA: Diagnosis not present

## 2022-02-26 DIAGNOSIS — R8290 Unspecified abnormal findings in urine: Secondary | ICD-10-CM | POA: Diagnosis not present

## 2022-02-26 NOTE — Progress Notes (Signed)
Cath Change/ Replacement  Patient is present today for a catheter change due to urinary retention.  31m of water was removed from the balloon, a 16FR foley cath was removed without difficulty.  Patient was cleaned and prepped in a sterile fashion with betadine and 2% lidocaine jelly was instilled into the urethra. A 16 FR foley cath was replaced into the bladder, no complications were noted. Urine return was noted 111mand urine was yellow in color. The balloon was filled with 1038mf sterile water. A bed bag was attached for drainage. Patient was given proper instruction on catheter care.    Performed by: KouLevi AlandMA  Follow up: u/a sent

## 2022-02-26 NOTE — Progress Notes (Addendum)
Assessment: 1. Urinary retention   2. Abnormal urine findings     Plan: His overall status is improving.  He is becoming more ambulatory. I think it would be reasonable to continue the Foley catheter for now and let him improve further. Continue tamsulosin 0.4 mg daily. Foley catheter changed today Return to office in 10-14 days for possible voiding trial. Urine culture sent today  Chief Complaint:  Chief Complaint  Patient presents with   Urinary Retention    History of Present Illness:  Bradley Sheppard is a 81 y.o. male who is seen for continued evaluation of urinary retention.  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  A Foley catheter was placed.  He was started on tamsulosin.  He was readmitted to the hospital from 01/08/2022 through 01/13/2022 due to exacerbation of congestive heart failure.  His Foley catheter was removed on 01/11/2022 however he was unable to void and the catheter was replaced.   He continued on tamsulosin 0.4 mg daily.  He denies any problems voiding prior to his rectal procedure. No prior urologic surgery. He was initially seen in September 2023.  Given his debilitated state, I recommended that the Foley catheter was continued. He was subsequently admitted to the hospital and treated for suspected rectal perforation.  He was treated nonsurgically.  During his evaluation, a urine culture grew pansensitive E. coli.  His last catheter change was at [facility] on 01/29/2022.  He completed antibiotics on 02/01/2022.    Patient resides with skilled nursing facility.  His mobility is gradually improving.  He is able to walk with the assistance of a walker and a therapist.  He reports improvement in his bowel function.  His catheter has been draining well.  No gross hematuria.  He reports improvement in his decubitus ulcers.  He was restarted on tamsulosin approximately 5 days ago.  Portions of the above documentation were  copied from a prior visit for review purposes only.   Past Medical History:  Past Medical History:  Diagnosis Date   (HFpEF) heart failure with preserved ejection fraction (HCC)    Anxiety    Aortic stenosis    Arthritis    Asthma    Chronic pain    CKD (chronic kidney disease) stage 3, GFR 30-59 ml/min (HCC)    Coronary atherosclerosis of native coronary artery    Multvessel, DES to LAD and RCA 8/03; EF 65% by echo 11/2014   DM2 (diabetes mellitus, type 2) ([place])    Essential hypertension    Gastric ulcer    Gastritis    Related to NSAIDs   GERD (gastroesophageal reflux disease)    Iron deficiency anemia    Mitral valve regurgitation    Mixed hyperlipidemia    Myocardial infarction [facility])    Nephrolithiasis     Past Surgical History:  Past Surgical History:  Procedure Laterality Date   APPENDECTOMY     BIOPSY  09/25/2021   Procedure: BIOPSY;  Surgeon: Daneil Dolin, MD;  [facility];  Service: Endoscopy;;   CARDIAC CATHETERIZATION  01/2002   90% obstruction in proximal LAD, 60 % obstruction in proximal circumflex and 70%  in proximal RCA. LAD & RCA stented  stents x 2   COLONOSCOPY WITH PROPOFOL N/A 09/25/2021   Procedure: COLONOSCOPY WITH PROPOFOL;  Surgeon: Daneil Dolin, MD;  [facility];  Service: Endoscopy;  Laterality: N/A;  11:00am   L knee replacement  2009  Subsequent infectino requiring resectino arthroplasty with incision and drainage 8/10, and reimplantizathion arthroplasty, 9/20. 5 total surgeries.   PARTIAL PROCTECTOMY BY TEM N/A 12/31/2021   Procedure: TEM PARTIAL PROTECTOMY;  Surgeon: Michael Boston, MD;  Location: [facility];  Service: General;  Laterality: N/A;    Allergies:  Allergies  Allergen Reactions   Nsaids Other (See Comments)    On blood thinners with h/o IDA/bleeding   Prednisone Other (See Comments)    "makes him feel faint", hyperglycemic     Family History:  Family History  Problem Relation Age of Onset    Diabetes Mother    Aneurysm Mother        Brain   Stroke Father    Stroke Other    Colon cancer Neg Hx     Social History:  Social History   Tobacco Use   Smoking status: Former    Types: Cigarettes    Passive exposure: Never   Smokeless tobacco: Current    Types: Database administrator Use   Vaping Use: Never used  Substance Use Topics   Alcohol use: No   Drug use: No    ROS: Constitutional:  Negative for fever, chills, weight loss CV: Negative for chest pain, previous MI, hypertension Respiratory:  Negative for shortness of breath, wheezing, sleep apnea, frequent cough GI:  Negative for nausea, vomiting, bloody stool, GERD  Physical exam: BP 91/63   Pulse (!) 47   Ht 6' (1.829 m)   Wt 195 lb (88.5 kg)   BMI 26.45 kg/m  GENERAL APPEARANCE:  Well appearing, well developed, well nourished, NAD HEENT:  Atraumatic, normocephalic, oropharynx clear NECK:  Supple without lymphadenopathy or thyromegaly ABDOMEN:  Soft, non-tender, no masses EXTREMITIES:  Moves all extremities well, without clubbing, cyanosis, or edema NEUROLOGIC:  Alert and oriented x 3, in wheelchair, CN II-XII grossly intact MENTAL STATUS:  appropriate BACK:  Non-tender to palpation, No CVAT SKIN:  Warm, dry, and intact GU:  foley draining clear urine  Results: U/A:  >30 WBC, 0-2 RBC, many bacteria, + yeast, nitrite neg

## 2022-02-26 NOTE — Addendum Note (Signed)
Addended by: Primus Bravo on: 02/26/2022 11:37 AM   Modules accepted: Orders

## 2022-02-27 LAB — URINALYSIS, ROUTINE W REFLEX MICROSCOPIC
Bilirubin, UA: NEGATIVE
Glucose, UA: NEGATIVE
Ketones, UA: NEGATIVE
Nitrite, UA: NEGATIVE
Specific Gravity, UA: 1.02 (ref 1.005–1.030)
Urobilinogen, Ur: 0.2 mg/dL (ref 0.2–1.0)
pH, UA: 5 (ref 5.0–7.5)

## 2022-02-27 LAB — MICROSCOPIC EXAMINATION: WBC, UA: >30 /hpf — AB (ref 0–5)

## 2022-03-12 ENCOUNTER — Ambulatory Visit: Payer: PPO | Admitting: Physician Assistant

## 2022-03-16 ENCOUNTER — Encounter: Payer: Self-pay | Admitting: Physician Assistant

## 2022-03-16 ENCOUNTER — Ambulatory Visit (INDEPENDENT_AMBULATORY_CARE_PROVIDER_SITE_OTHER): Payer: PPO | Admitting: Physician Assistant

## 2022-03-16 VITALS — BP 142/84 | HR 131

## 2022-03-16 DIAGNOSIS — R339 Retention of urine, unspecified: Secondary | ICD-10-CM | POA: Diagnosis not present

## 2022-03-16 DIAGNOSIS — Z978 Presence of other specified devices: Secondary | ICD-10-CM | POA: Diagnosis not present

## 2022-03-16 NOTE — Progress Notes (Signed)
Fill and Pull Catheter Removal  Patient is present today for a catheter removal.  Patient was cleaned and prepped in a sterile fashion 379m of sterile water/ saline was instilled into the bladder when the patient felt the urge to urinate. 113mof water was then drained from the balloon.  A 16FR foley cath was removed from the bladder no complications were noted .  Patient as then given some time to void on their own.  Patient can void  27579mn their own after some time.  Patient tolerated well.  Performed by: ShaMarisue BrooklynMA  Follow up/ Additional notes: Follow up in one month for PVR

## 2022-03-16 NOTE — Progress Notes (Signed)
Assessment: 1. Urinary retention - Bladder Voiding Trial  2. Indwelling Foley catheter present    Plan: Successful voiding trial today.  Follow-up in 1 month for PVR and continue tamsulosin daily until that time.   Chief Complaint: No chief complaint on file.   HPI: Bradley Sheppard is a 81 y.o. male who presents for continued evaluation of urinary retention post procedure on 12/31/2021.  Patient has tolerated his Foley catheter well and continues with rehab at the [facility] skilled facility.  He has been on tamsulosin daily.  Last catheter change was 02/26/2022.  Patient would like to proceed with voiding trial if possible.  He has noted no gross hematuria in the catheter bag.   02/26/22 Bradley Sheppard is a 81 y.o. male who is seen for continued evaluation of urinary retention.  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  A Foley catheter was placed.  He was started on tamsulosin.  He was readmitted to the hospital from 01/08/2022 through 01/13/2022 due to exacerbation of congestive heart failure.  His Foley catheter was removed on 01/11/2022 however he was unable to void and the catheter was replaced.   He continued on tamsulosin 0.4 mg daily.   He denies any problems voiding prior to his rectal procedure. No prior urologic surgery. He was initially seen in September 2023.  Given his debilitated state, I recommended that the Foley catheter was continued. He was subsequently admitted to the hospital and treated for suspected rectal perforation.  He was treated nonsurgically.  During his evaluation, a urine culture grew pansensitive E. coli.  His last catheter change was at [facility] on 01/29/2022.  He completed antibiotics on 02/01/2022.     Patient resides with skilled nursing facility.  His mobility is gradually improving.  He is able to walk with the assistance of a walker and a therapist.  He reports improvement in his bowel function.  His  catheter has been draining well.  No gross hematuria.  He reports improvement in his decubitus ulcers.  He was restarted on tamsulosin approximately 5 days ago.  Portions of the above documentation were copied from a prior visit for review purposes only.  Allergies: Allergies  Allergen Reactions   Nsaids Other (See Comments)    On blood thinners with h/o IDA/bleeding   Prednisone Other (See Comments)    "makes him feel faint", hyperglycemic     PMH: Past Medical History:  Diagnosis Date   (HFpEF) heart failure with preserved ejection fraction (HCC)    Anxiety    Aortic stenosis    Arthritis    Asthma    Chronic pain    CKD (chronic kidney disease) stage 3, GFR 30-59 ml/min (HCC)    Coronary atherosclerosis of native coronary artery    Multvessel, DES to LAD and RCA 8/03; EF 65% by echo 11/2014   DM2 (diabetes mellitus, type 2) (HCC)    Essential hypertension    Gastric ulcer    Gastritis    Related to NSAIDs   GERD (gastroesophageal reflux disease)    Iron deficiency anemia    Mitral valve regurgitation    Mixed hyperlipidemia    Myocardial infarction ([place])    Nephrolithiasis     PSH: Past Surgical History:  Procedure Laterality Date   APPENDECTOMY     BIOPSY  09/25/2021   Procedure: BIOPSY;  Surgeon: Daneil Dolin, MD;  [facility];  Service: Endoscopy;;   CARDIAC CATHETERIZATION  01/2002   90% obstruction in proximal LAD, 60 % obstruction in proximal circumflex and 70%  in proximal RCA. LAD & RCA stented  stents x 2   COLONOSCOPY WITH PROPOFOL N/A 09/25/2021   Procedure: COLONOSCOPY WITH PROPOFOL;  Surgeon: Daneil Dolin, MD;  [facility];  Service: Endoscopy;  Laterality: N/A;  11:00am   L knee replacement  2009   Subsequent infectino requiring resectino arthroplasty with incision and drainage 8/10, and reimplantizathion arthroplasty, 9/20. 5 total surgeries.   PARTIAL PROCTECTOMY BY TEM N/A 12/31/2021   Procedure: TEM PARTIAL PROTECTOMY;   Surgeon: Michael Boston, MD;  [facility];  Service: General;  Laterality: N/A;    SH: Social History   Tobacco Use   Smoking status: Former    Types: Cigarettes    Passive exposure: Never   Smokeless tobacco: Current    Types: Chew  Vaping Use   Vaping Use: Never used  Substance Use Topics   Alcohol use: No   Drug use: No    ROS: All other review of systems were reviewed and are negative except what is noted above in HPI  PE: BP (!) 142/84   Pulse (!) 131  GENERAL APPEARANCE:  Well appearing, well developed, well nourished, NAD HEENT:  Atraumatic, normocephalic NECK:  Supple. Trachea midline ABDOMEN:  Soft, non-tender, no masses EXTREMITIES:  Moves all extremities well, NEUROLOGIC:  Alert and oriented x 3 MENTAL STATUS:  appropriate BACK:  Non-tender to palpation, No CVAT SKIN:  Warm, dry, and intact   Results: Laboratory Data: Lab Results  Component Value Date   WBC 10.5 02/03/2022   HGB 10.1 (L) 02/03/2022   HCT 30.4 (L) 02/03/2022   MCV 91.0 02/03/2022   PLT 384 02/03/2022    Lab Results  Component Value Date   CREATININE 1.93 (H) 02/03/2022    No results found for: "PSA"  No results found for: "TESTOSTERONE"  Lab Results  Component Value Date   HGBA1C 7.2 (H) 12/31/2021    Urinalysis    Component Value Date/Time   COLORURINE YELLOW 01/28/2022 1455   APPEARANCEUR Cloudy (A) 02/26/2022 1116   LABSPEC 1.010 01/28/2022 1455   PHURINE 5.0 01/28/2022 1455   GLUCOSEU Negative 02/26/2022 1116   HGBUR MODERATE (A) 01/28/2022 1455   BILIRUBINUR Negative 02/26/2022 1116   KETONESUR NEGATIVE 01/28/2022 1455   PROTEINUR 1+ (A) 02/26/2022 1116   PROTEINUR NEGATIVE 01/28/2022 1455   UROBILINOGEN 0.2 12/26/2008 0950   NITRITE Negative 02/26/2022 1116   NITRITE NEGATIVE 01/28/2022 1455   LEUKOCYTESUR 3+ (A) 02/26/2022 1116   LEUKOCYTESUR LARGE (A) 01/28/2022 1455    Lab Results  Component Value Date   LABMICR See below: 02/26/2022   WBCUA  >30 (A) 02/26/2022   LABEPIT 0-10 02/26/2022   BACTERIA Many (A) 02/26/2022    Pertinent Imaging: No results found for this or any previous visit.  Results for orders placed during the hospital encounter of 01/08/22  US Venous Img Lower Bilateral (DVT)  Narrative CLINICAL DATA:  Bilateral lower extremity edema for 1 month.  EXAM: Bilateral LOWER EXTREMITY VENOUS DOPPLER ULTRASOUND  TECHNIQUE: Gray-scale sonography with compression, as well as color and duplex ultrasound, were performed to evaluate the deep venous system(s) from the level of the common femoral vein through the popliteal and proximal calf veins.  COMPARISON:  No recent comparison imaging. Imaging from 2009 is available.  FINDINGS: VENOUS  Normal compressibility of the common femoral, superficial femoral, and popliteal veins, as well as the visualized  calf veins. Visualized portions of profunda femoral vein and great saphenous vein unremarkable. No filling defects to suggest DVT on grayscale or color Doppler imaging. Doppler waveforms show normal direction of venous flow, normal respiratory plasticity and response to augmentation.  OTHER  Edema in the lower leg bilaterally.  Limitations: Limited assessment of bilateral calf veins due to presence of lower extremity, lower leg edema.  IMPRESSION: Negative for DVT. Mildly limited with respect to calf vein assessment due to lower leg edema.   Electronically Signed By: Zetta Bills M.D. On: 01/08/2022 11:45  No results found for this or any previous visit.  No results found for this or any previous visit.  No results found for this or any previous visit.  No valid procedures specified. No results found for this or any previous visit.  No results found for this or any previous visit.  No results found for this or any previous visit (from the past 24 hour(s)).

## 2022-03-17 DIAGNOSIS — L89152 Pressure ulcer of sacral region, stage 2: Secondary | ICD-10-CM | POA: Diagnosis not present

## 2022-03-19 DIAGNOSIS — M25562 Pain in left knee: Secondary | ICD-10-CM | POA: Diagnosis not present

## 2022-03-19 DIAGNOSIS — G8929 Other chronic pain: Secondary | ICD-10-CM | POA: Diagnosis not present

## 2022-03-19 DIAGNOSIS — C884 Extranodal marginal zone B-cell lymphoma of mucosa-associated lymphoid tissue [MALT-lymphoma]: Secondary | ICD-10-CM | POA: Diagnosis not present

## 2022-03-19 DIAGNOSIS — I10 Essential (primary) hypertension: Secondary | ICD-10-CM | POA: Diagnosis not present

## 2022-03-19 DIAGNOSIS — Z299 Encounter for prophylactic measures, unspecified: Secondary | ICD-10-CM | POA: Diagnosis not present

## 2022-03-19 DIAGNOSIS — M25561 Pain in right knee: Secondary | ICD-10-CM | POA: Diagnosis not present

## 2022-03-24 DIAGNOSIS — D692 Other nonthrombocytopenic purpura: Secondary | ICD-10-CM | POA: Diagnosis not present

## 2022-03-24 DIAGNOSIS — E1122 Type 2 diabetes mellitus with diabetic chronic kidney disease: Secondary | ICD-10-CM | POA: Diagnosis not present

## 2022-03-24 DIAGNOSIS — Z7984 Long term (current) use of oral hypoglycemic drugs: Secondary | ICD-10-CM | POA: Diagnosis not present

## 2022-03-24 DIAGNOSIS — R339 Retention of urine, unspecified: Secondary | ICD-10-CM | POA: Diagnosis not present

## 2022-03-24 DIAGNOSIS — Z87440 Personal history of urinary (tract) infections: Secondary | ICD-10-CM | POA: Diagnosis not present

## 2022-03-24 DIAGNOSIS — L89622 Pressure ulcer of left heel, stage 2: Secondary | ICD-10-CM | POA: Diagnosis not present

## 2022-03-24 DIAGNOSIS — N139 Obstructive and reflux uropathy, unspecified: Secondary | ICD-10-CM | POA: Diagnosis not present

## 2022-03-24 DIAGNOSIS — I2510 Atherosclerotic heart disease of native coronary artery without angina pectoris: Secondary | ICD-10-CM | POA: Diagnosis not present

## 2022-03-24 DIAGNOSIS — Z7982 Long term (current) use of aspirin: Secondary | ICD-10-CM | POA: Diagnosis not present

## 2022-03-24 DIAGNOSIS — C884 Extranodal marginal zone B-cell lymphoma of mucosa-associated lymphoid tissue [MALT-lymphoma]: Secondary | ICD-10-CM | POA: Diagnosis not present

## 2022-03-24 DIAGNOSIS — L89621 Pressure ulcer of left heel, stage 1: Secondary | ICD-10-CM | POA: Diagnosis not present

## 2022-03-24 DIAGNOSIS — E7800 Pure hypercholesterolemia, unspecified: Secondary | ICD-10-CM | POA: Diagnosis not present

## 2022-03-24 DIAGNOSIS — Z8616 Personal history of COVID-19: Secondary | ICD-10-CM | POA: Diagnosis not present

## 2022-03-24 DIAGNOSIS — F411 Generalized anxiety disorder: Secondary | ICD-10-CM | POA: Diagnosis not present

## 2022-03-24 DIAGNOSIS — C8190 Hodgkin lymphoma, unspecified, unspecified site: Secondary | ICD-10-CM | POA: Diagnosis not present

## 2022-03-24 DIAGNOSIS — D509 Iron deficiency anemia, unspecified: Secondary | ICD-10-CM | POA: Diagnosis not present

## 2022-03-24 DIAGNOSIS — L89153 Pressure ulcer of sacral region, stage 3: Secondary | ICD-10-CM | POA: Diagnosis not present

## 2022-03-24 DIAGNOSIS — I130 Hypertensive heart and chronic kidney disease with heart failure and stage 1 through stage 4 chronic kidney disease, or unspecified chronic kidney disease: Secondary | ICD-10-CM | POA: Diagnosis not present

## 2022-03-24 DIAGNOSIS — I080 Rheumatic disorders of both mitral and aortic valves: Secondary | ICD-10-CM | POA: Diagnosis not present

## 2022-03-24 DIAGNOSIS — M1990 Unspecified osteoarthritis, unspecified site: Secondary | ICD-10-CM | POA: Diagnosis not present

## 2022-03-24 DIAGNOSIS — I480 Paroxysmal atrial fibrillation: Secondary | ICD-10-CM | POA: Diagnosis not present

## 2022-03-24 DIAGNOSIS — Z9181 History of falling: Secondary | ICD-10-CM | POA: Diagnosis not present

## 2022-03-24 DIAGNOSIS — Z96652 Presence of left artificial knee joint: Secondary | ICD-10-CM | POA: Diagnosis not present

## 2022-03-24 DIAGNOSIS — N1832 Chronic kidney disease, stage 3b: Secondary | ICD-10-CM | POA: Diagnosis not present

## 2022-03-24 DIAGNOSIS — I5043 Acute on chronic combined systolic (congestive) and diastolic (congestive) heart failure: Secondary | ICD-10-CM | POA: Diagnosis not present

## 2022-03-27 DIAGNOSIS — L89322 Pressure ulcer of left buttock, stage 2: Secondary | ICD-10-CM | POA: Diagnosis not present

## 2022-03-27 DIAGNOSIS — L89152 Pressure ulcer of sacral region, stage 2: Secondary | ICD-10-CM | POA: Diagnosis not present

## 2022-03-27 DIAGNOSIS — I5032 Chronic diastolic (congestive) heart failure: Secondary | ICD-10-CM | POA: Diagnosis not present

## 2022-03-27 DIAGNOSIS — M1990 Unspecified osteoarthritis, unspecified site: Secondary | ICD-10-CM | POA: Diagnosis not present

## 2022-03-27 DIAGNOSIS — E1122 Type 2 diabetes mellitus with diabetic chronic kidney disease: Secondary | ICD-10-CM | POA: Diagnosis not present

## 2022-03-27 DIAGNOSIS — I10 Essential (primary) hypertension: Secondary | ICD-10-CM | POA: Diagnosis not present

## 2022-03-27 DIAGNOSIS — D649 Anemia, unspecified: Secondary | ICD-10-CM | POA: Diagnosis not present

## 2022-03-27 DIAGNOSIS — Z09 Encounter for follow-up examination after completed treatment for conditions other than malignant neoplasm: Secondary | ICD-10-CM | POA: Diagnosis not present

## 2022-04-03 DIAGNOSIS — Z8616 Personal history of COVID-19: Secondary | ICD-10-CM | POA: Diagnosis not present

## 2022-04-03 DIAGNOSIS — I080 Rheumatic disorders of both mitral and aortic valves: Secondary | ICD-10-CM | POA: Diagnosis not present

## 2022-04-03 DIAGNOSIS — E1122 Type 2 diabetes mellitus with diabetic chronic kidney disease: Secondary | ICD-10-CM | POA: Diagnosis not present

## 2022-04-03 DIAGNOSIS — N1832 Chronic kidney disease, stage 3b: Secondary | ICD-10-CM | POA: Diagnosis not present

## 2022-04-03 DIAGNOSIS — R339 Retention of urine, unspecified: Secondary | ICD-10-CM | POA: Diagnosis not present

## 2022-04-03 DIAGNOSIS — F411 Generalized anxiety disorder: Secondary | ICD-10-CM | POA: Diagnosis not present

## 2022-04-03 DIAGNOSIS — Z87440 Personal history of urinary (tract) infections: Secondary | ICD-10-CM | POA: Diagnosis not present

## 2022-04-03 DIAGNOSIS — L8990 Pressure ulcer of unspecified site, unspecified stage: Secondary | ICD-10-CM | POA: Diagnosis not present

## 2022-04-03 DIAGNOSIS — Z96652 Presence of left artificial knee joint: Secondary | ICD-10-CM | POA: Diagnosis not present

## 2022-04-03 DIAGNOSIS — N139 Obstructive and reflux uropathy, unspecified: Secondary | ICD-10-CM | POA: Diagnosis not present

## 2022-04-03 DIAGNOSIS — C8190 Hodgkin lymphoma, unspecified, unspecified site: Secondary | ICD-10-CM | POA: Diagnosis not present

## 2022-04-03 DIAGNOSIS — I480 Paroxysmal atrial fibrillation: Secondary | ICD-10-CM | POA: Diagnosis not present

## 2022-04-03 DIAGNOSIS — Z9181 History of falling: Secondary | ICD-10-CM | POA: Diagnosis not present

## 2022-04-03 DIAGNOSIS — L89153 Pressure ulcer of sacral region, stage 3: Secondary | ICD-10-CM | POA: Diagnosis not present

## 2022-04-03 DIAGNOSIS — I2510 Atherosclerotic heart disease of native coronary artery without angina pectoris: Secondary | ICD-10-CM | POA: Diagnosis not present

## 2022-04-03 DIAGNOSIS — I130 Hypertensive heart and chronic kidney disease with heart failure and stage 1 through stage 4 chronic kidney disease, or unspecified chronic kidney disease: Secondary | ICD-10-CM | POA: Diagnosis not present

## 2022-04-03 DIAGNOSIS — I509 Heart failure, unspecified: Secondary | ICD-10-CM | POA: Diagnosis not present

## 2022-04-03 DIAGNOSIS — C884 Extranodal marginal zone B-cell lymphoma of mucosa-associated lymphoid tissue [MALT-lymphoma]: Secondary | ICD-10-CM | POA: Diagnosis not present

## 2022-04-03 DIAGNOSIS — I5043 Acute on chronic combined systolic (congestive) and diastolic (congestive) heart failure: Secondary | ICD-10-CM | POA: Diagnosis not present

## 2022-04-03 DIAGNOSIS — M1990 Unspecified osteoarthritis, unspecified site: Secondary | ICD-10-CM | POA: Diagnosis not present

## 2022-04-03 DIAGNOSIS — L89621 Pressure ulcer of left heel, stage 1: Secondary | ICD-10-CM | POA: Diagnosis not present

## 2022-04-03 DIAGNOSIS — I5031 Acute diastolic (congestive) heart failure: Secondary | ICD-10-CM | POA: Diagnosis not present

## 2022-04-03 DIAGNOSIS — D692 Other nonthrombocytopenic purpura: Secondary | ICD-10-CM | POA: Diagnosis not present

## 2022-04-03 DIAGNOSIS — D509 Iron deficiency anemia, unspecified: Secondary | ICD-10-CM | POA: Diagnosis not present

## 2022-04-03 DIAGNOSIS — R0602 Shortness of breath: Secondary | ICD-10-CM | POA: Diagnosis not present

## 2022-04-03 DIAGNOSIS — E7800 Pure hypercholesterolemia, unspecified: Secondary | ICD-10-CM | POA: Diagnosis not present

## 2022-04-09 DIAGNOSIS — E1151 Type 2 diabetes mellitus with diabetic peripheral angiopathy without gangrene: Secondary | ICD-10-CM | POA: Diagnosis not present

## 2022-04-09 DIAGNOSIS — E1169 Type 2 diabetes mellitus with other specified complication: Secondary | ICD-10-CM | POA: Diagnosis not present

## 2022-04-09 DIAGNOSIS — I509 Heart failure, unspecified: Secondary | ICD-10-CM | POA: Diagnosis not present

## 2022-04-09 DIAGNOSIS — E1165 Type 2 diabetes mellitus with hyperglycemia: Secondary | ICD-10-CM | POA: Diagnosis not present

## 2022-04-09 DIAGNOSIS — Z794 Long term (current) use of insulin: Secondary | ICD-10-CM | POA: Diagnosis not present

## 2022-04-09 DIAGNOSIS — D6869 Other thrombophilia: Secondary | ICD-10-CM | POA: Diagnosis not present

## 2022-04-09 DIAGNOSIS — I4891 Unspecified atrial fibrillation: Secondary | ICD-10-CM | POA: Diagnosis not present

## 2022-04-09 DIAGNOSIS — I130 Hypertensive heart and chronic kidney disease with heart failure and stage 1 through stage 4 chronic kidney disease, or unspecified chronic kidney disease: Secondary | ICD-10-CM | POA: Diagnosis not present

## 2022-04-09 DIAGNOSIS — E1142 Type 2 diabetes mellitus with diabetic polyneuropathy: Secondary | ICD-10-CM | POA: Diagnosis not present

## 2022-04-09 DIAGNOSIS — I700 Atherosclerosis of aorta: Secondary | ICD-10-CM | POA: Diagnosis not present

## 2022-04-09 DIAGNOSIS — C9000 Multiple myeloma not having achieved remission: Secondary | ICD-10-CM | POA: Diagnosis not present

## 2022-04-09 DIAGNOSIS — E1122 Type 2 diabetes mellitus with diabetic chronic kidney disease: Secondary | ICD-10-CM | POA: Diagnosis not present

## 2022-04-15 DIAGNOSIS — F411 Generalized anxiety disorder: Secondary | ICD-10-CM | POA: Diagnosis not present

## 2022-04-15 DIAGNOSIS — L89621 Pressure ulcer of left heel, stage 1: Secondary | ICD-10-CM | POA: Diagnosis not present

## 2022-04-15 DIAGNOSIS — R339 Retention of urine, unspecified: Secondary | ICD-10-CM | POA: Diagnosis not present

## 2022-04-15 DIAGNOSIS — C8190 Hodgkin lymphoma, unspecified, unspecified site: Secondary | ICD-10-CM | POA: Diagnosis not present

## 2022-04-15 DIAGNOSIS — Z8616 Personal history of COVID-19: Secondary | ICD-10-CM | POA: Diagnosis not present

## 2022-04-15 DIAGNOSIS — Z96652 Presence of left artificial knee joint: Secondary | ICD-10-CM | POA: Diagnosis not present

## 2022-04-15 DIAGNOSIS — I480 Paroxysmal atrial fibrillation: Secondary | ICD-10-CM | POA: Diagnosis not present

## 2022-04-15 DIAGNOSIS — D509 Iron deficiency anemia, unspecified: Secondary | ICD-10-CM | POA: Diagnosis not present

## 2022-04-15 DIAGNOSIS — I2510 Atherosclerotic heart disease of native coronary artery without angina pectoris: Secondary | ICD-10-CM | POA: Diagnosis not present

## 2022-04-15 DIAGNOSIS — Z9181 History of falling: Secondary | ICD-10-CM | POA: Diagnosis not present

## 2022-04-15 DIAGNOSIS — C884 Extranodal marginal zone B-cell lymphoma of mucosa-associated lymphoid tissue [MALT-lymphoma]: Secondary | ICD-10-CM | POA: Diagnosis not present

## 2022-04-15 DIAGNOSIS — L89153 Pressure ulcer of sacral region, stage 3: Secondary | ICD-10-CM | POA: Diagnosis not present

## 2022-04-15 DIAGNOSIS — E7800 Pure hypercholesterolemia, unspecified: Secondary | ICD-10-CM | POA: Diagnosis not present

## 2022-04-15 DIAGNOSIS — D692 Other nonthrombocytopenic purpura: Secondary | ICD-10-CM | POA: Diagnosis not present

## 2022-04-15 DIAGNOSIS — N1832 Chronic kidney disease, stage 3b: Secondary | ICD-10-CM | POA: Diagnosis not present

## 2022-04-15 DIAGNOSIS — I130 Hypertensive heart and chronic kidney disease with heart failure and stage 1 through stage 4 chronic kidney disease, or unspecified chronic kidney disease: Secondary | ICD-10-CM | POA: Diagnosis not present

## 2022-04-15 DIAGNOSIS — M1990 Unspecified osteoarthritis, unspecified site: Secondary | ICD-10-CM | POA: Diagnosis not present

## 2022-04-15 DIAGNOSIS — I5043 Acute on chronic combined systolic (congestive) and diastolic (congestive) heart failure: Secondary | ICD-10-CM | POA: Diagnosis not present

## 2022-04-15 DIAGNOSIS — E1122 Type 2 diabetes mellitus with diabetic chronic kidney disease: Secondary | ICD-10-CM | POA: Diagnosis not present

## 2022-04-15 DIAGNOSIS — Z87440 Personal history of urinary (tract) infections: Secondary | ICD-10-CM | POA: Diagnosis not present

## 2022-04-15 DIAGNOSIS — I080 Rheumatic disorders of both mitral and aortic valves: Secondary | ICD-10-CM | POA: Diagnosis not present

## 2022-04-15 DIAGNOSIS — N139 Obstructive and reflux uropathy, unspecified: Secondary | ICD-10-CM | POA: Diagnosis not present

## 2022-04-16 DIAGNOSIS — Z6825 Body mass index (BMI) 25.0-25.9, adult: Secondary | ICD-10-CM | POA: Diagnosis not present

## 2022-04-16 DIAGNOSIS — I4891 Unspecified atrial fibrillation: Secondary | ICD-10-CM | POA: Diagnosis not present

## 2022-04-16 DIAGNOSIS — I5032 Chronic diastolic (congestive) heart failure: Secondary | ICD-10-CM | POA: Diagnosis not present

## 2022-04-16 DIAGNOSIS — Z7982 Long term (current) use of aspirin: Secondary | ICD-10-CM | POA: Diagnosis not present

## 2022-04-16 DIAGNOSIS — L89150 Pressure ulcer of sacral region, unstageable: Secondary | ICD-10-CM | POA: Diagnosis not present

## 2022-04-16 DIAGNOSIS — I2510 Atherosclerotic heart disease of native coronary artery without angina pectoris: Secondary | ICD-10-CM | POA: Diagnosis not present

## 2022-04-16 DIAGNOSIS — N1832 Chronic kidney disease, stage 3b: Secondary | ICD-10-CM | POA: Diagnosis not present

## 2022-04-16 DIAGNOSIS — D692 Other nonthrombocytopenic purpura: Secondary | ICD-10-CM | POA: Diagnosis not present

## 2022-04-17 DIAGNOSIS — A419 Sepsis, unspecified organism: Secondary | ICD-10-CM | POA: Diagnosis not present

## 2022-04-20 ENCOUNTER — Encounter: Payer: Self-pay | Admitting: Urology

## 2022-04-20 ENCOUNTER — Ambulatory Visit (INDEPENDENT_AMBULATORY_CARE_PROVIDER_SITE_OTHER): Payer: PPO | Admitting: Urology

## 2022-04-20 VITALS — BP 118/73 | HR 56 | Ht 72.0 in | Wt 191.0 lb

## 2022-04-20 DIAGNOSIS — L89153 Pressure ulcer of sacral region, stage 3: Secondary | ICD-10-CM | POA: Diagnosis not present

## 2022-04-20 DIAGNOSIS — N138 Other obstructive and reflux uropathy: Secondary | ICD-10-CM

## 2022-04-20 DIAGNOSIS — C884 Extranodal marginal zone B-cell lymphoma of mucosa-associated lymphoid tissue [MALT-lymphoma]: Secondary | ICD-10-CM | POA: Diagnosis not present

## 2022-04-20 DIAGNOSIS — C8190 Hodgkin lymphoma, unspecified, unspecified site: Secondary | ICD-10-CM | POA: Diagnosis not present

## 2022-04-20 DIAGNOSIS — I2510 Atherosclerotic heart disease of native coronary artery without angina pectoris: Secondary | ICD-10-CM | POA: Diagnosis not present

## 2022-04-20 DIAGNOSIS — Z96652 Presence of left artificial knee joint: Secondary | ICD-10-CM | POA: Diagnosis not present

## 2022-04-20 DIAGNOSIS — N401 Enlarged prostate with lower urinary tract symptoms: Secondary | ICD-10-CM

## 2022-04-20 DIAGNOSIS — I080 Rheumatic disorders of both mitral and aortic valves: Secondary | ICD-10-CM | POA: Diagnosis not present

## 2022-04-20 DIAGNOSIS — R8271 Bacteriuria: Secondary | ICD-10-CM | POA: Diagnosis not present

## 2022-04-20 DIAGNOSIS — E7800 Pure hypercholesterolemia, unspecified: Secondary | ICD-10-CM | POA: Diagnosis not present

## 2022-04-20 DIAGNOSIS — Z9181 History of falling: Secondary | ICD-10-CM | POA: Diagnosis not present

## 2022-04-20 DIAGNOSIS — I5043 Acute on chronic combined systolic (congestive) and diastolic (congestive) heart failure: Secondary | ICD-10-CM | POA: Diagnosis not present

## 2022-04-20 DIAGNOSIS — L89621 Pressure ulcer of left heel, stage 1: Secondary | ICD-10-CM | POA: Diagnosis not present

## 2022-04-20 DIAGNOSIS — F411 Generalized anxiety disorder: Secondary | ICD-10-CM | POA: Diagnosis not present

## 2022-04-20 DIAGNOSIS — I480 Paroxysmal atrial fibrillation: Secondary | ICD-10-CM | POA: Diagnosis not present

## 2022-04-20 DIAGNOSIS — R339 Retention of urine, unspecified: Secondary | ICD-10-CM

## 2022-04-20 DIAGNOSIS — N1832 Chronic kidney disease, stage 3b: Secondary | ICD-10-CM | POA: Diagnosis not present

## 2022-04-20 DIAGNOSIS — R3915 Urgency of urination: Secondary | ICD-10-CM

## 2022-04-20 DIAGNOSIS — Z87440 Personal history of urinary (tract) infections: Secondary | ICD-10-CM | POA: Diagnosis not present

## 2022-04-20 DIAGNOSIS — Z8616 Personal history of COVID-19: Secondary | ICD-10-CM | POA: Diagnosis not present

## 2022-04-20 DIAGNOSIS — E1122 Type 2 diabetes mellitus with diabetic chronic kidney disease: Secondary | ICD-10-CM | POA: Diagnosis not present

## 2022-04-20 DIAGNOSIS — M1990 Unspecified osteoarthritis, unspecified site: Secondary | ICD-10-CM | POA: Diagnosis not present

## 2022-04-20 DIAGNOSIS — D509 Iron deficiency anemia, unspecified: Secondary | ICD-10-CM | POA: Diagnosis not present

## 2022-04-20 DIAGNOSIS — N139 Obstructive and reflux uropathy, unspecified: Secondary | ICD-10-CM | POA: Diagnosis not present

## 2022-04-20 DIAGNOSIS — I130 Hypertensive heart and chronic kidney disease with heart failure and stage 1 through stage 4 chronic kidney disease, or unspecified chronic kidney disease: Secondary | ICD-10-CM | POA: Diagnosis not present

## 2022-04-20 DIAGNOSIS — D692 Other nonthrombocytopenic purpura: Secondary | ICD-10-CM | POA: Diagnosis not present

## 2022-04-20 LAB — URINALYSIS, ROUTINE W REFLEX MICROSCOPIC
Bilirubin, UA: NEGATIVE
Glucose, UA: NEGATIVE
Ketones, UA: NEGATIVE
Nitrite, UA: NEGATIVE
Protein,UA: NEGATIVE
Specific Gravity, UA: 1.01 (ref 1.005–1.030)
Urobilinogen, Ur: 0.2 mg/dL (ref 0.2–1.0)
pH, UA: 5 (ref 5.0–7.5)

## 2022-04-20 LAB — MICROSCOPIC EXAMINATION: WBC, UA: >30 /hpf — AB (ref 0–5)

## 2022-04-20 LAB — BLADDER SCAN AMB NON-IMAGING: Scan Result: 77

## 2022-04-20 MED ORDER — TAMSULOSIN HCL 0.4 MG PO CAPS: 0.4000 mg | ORAL_CAPSULE | Freq: Every day | ORAL | 3 refills | Status: DC

## 2022-04-20 NOTE — Progress Notes (Unsigned)
post void residual =77ml 

## 2022-04-20 NOTE — Progress Notes (Unsigned)
04/20/2022 10:47 AM   Bradley Sheppard 10/17/1940 [id number]  Referring provider: [name], MD [place],  [place] [address]  No chief complaint on file.   HPI:  F/u -   1) BPH - on tamsulosin. Prostate about 30 g on CT Sep 2023 and otherwise CT benign. 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 He passed a void trial.   Today, voiding well. PVR 77 ml. He then voided 60 ml for a UA. He continues tamsulosin. He has an adequate stream. Some urgency and UUI. UA with many bacteria. No dysuria or gross hematuria.   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   PMH: Past Medical History:  Diagnosis Date   (HFpEF) heart failure with preserved ejection fraction (HCC)    Anxiety    Aortic stenosis    Arthritis    Asthma    Chronic pain    CKD (chronic kidney disease) stage 3, GFR 30-59 ml/min (HCC)    Coronary atherosclerosis of native coronary artery    Multvessel, DES to LAD and RCA 8/03; EF 65% by echo 11/2014   DM2 (diabetes mellitus, type 2) ([place])    Essential hypertension    Gastric ulcer    Gastritis    Related to NSAIDs   GERD (gastroesophageal reflux disease)    Iron deficiency anemia    Mitral valve regurgitation    Mixed hyperlipidemia    Myocardial infarction [facility])    Nephrolithiasis     Surgical History: Past Surgical History:  Procedure Laterality Date   APPENDECTOMY     BIOPSY  09/25/2021   Procedure: BIOPSY;  Surgeon: Daneil Dolin, MD;  [facility];  Service: Endoscopy;;   CARDIAC CATHETERIZATION  01/2002   90% obstruction in proximal LAD, 60 % obstruction in proximal circumflex and 70%  in proximal RCA. LAD & RCA stented  stents x 2   COLONOSCOPY WITH PROPOFOL N/A 09/25/2021   Procedure: COLONOSCOPY WITH PROPOFOL;  Surgeon: Daneil Dolin, MD;  [facility];  Service: Endoscopy;  Laterality: N/A;  11:00am   L knee replacement  2009    Subsequent infectino requiring resectino arthroplasty with incision and drainage 8/10, and reimplantizathion arthroplasty, 9/20. 5 total surgeries.   PARTIAL PROCTECTOMY BY TEM N/A 12/31/2021   Procedure: TEM PARTIAL PROTECTOMY;  Surgeon: Michael Boston, MD;  [facility];  Service: General;  Laterality: N/A;    Home Medications:  Allergies as of 04/20/2022       Reactions   Nsaids Other (See Comments)   On blood thinners with h/o IDA/bleeding   Prednisone Other (See Comments)   "makes him feel faint", hyperglycemic         Medication List        Accurate as of April 20, 2022 10:47 AM. If you have any questions, ask your nurse or doctor.          acetaminophen 500 MG tablet Commonly known as: TYLENOL Take 1,000 mg by mouth every 6 (six) hours as needed for moderate pain.   ascorbic acid 500 MG tablet Commonly known as: VITAMIN C Take 500 mg by mouth at bedtime.   aspirin EC 81 MG tablet Take 1 tablet (81 mg total) by mouth daily with breakfast. Swallow whole.   carvedilol 12.5 MG tablet Commonly known as: COREG Take 1 tablet (12.5 mg total) by mouth 2 (two) times daily with a meal.  cyanocobalamin 500 MCG tablet Commonly known as: VITAMIN B12 Take 500 mcg by mouth daily.   feeding supplement Liqd Take 237 mLs by mouth 2 (two) times daily between meals.   FIBER-LAX PO Take 1 capsule by mouth daily.   furosemide 40 MG tablet Commonly known as: LASIX Take 1 tablet (40 mg total) by mouth daily.   glipiZIDE 10 MG 24 hr tablet Commonly known as: GLUCOTROL XL Take 1 tablet (10 mg total) by mouth daily with breakfast.   LORazepam 0.5 MG tablet Commonly known as: ATIVAN Take 1 tablet (0.5 mg total) by mouth at bedtime.   nitroGLYCERIN 0.4 MG SL tablet Commonly known as: NITROSTAT Place 1 tablet (0.4 mg total) under the tongue every 5 (five) minutes as needed.   oxyCODONE 5 MG immediate release tablet Commonly known as: Oxy IR/ROXICODONE Take 0.5-1  tablets (2.5-5 mg total) by mouth every 6 (six) hours as needed for moderate pain, severe pain or breakthrough pain.   polyethylene glycol 17 g packet Commonly known as: MIRALAX / GLYCOLAX Take 17 g by mouth 2 (two) times daily as needed for mild constipation.   rosuvastatin 5 MG tablet Commonly known as: CRESTOR Take 5 mg by mouth every Wednesday.   tamsulosin 0.4 MG Caps capsule Commonly known as: Flomax Take 1 capsule (0.4 mg total) by mouth daily.   witch hazel-glycerin pad Commonly known as: TUCKS Apply topically as needed for irritation or hemorrhoids.   Zinc Oxide 40 % Pste Apply 1 Application topically as needed. Apply to buttock and sacrum if silicone foam is not effective due to incontinence        Allergies:  Allergies  Allergen Reactions   Nsaids Other (See Comments)    On blood thinners with h/o IDA/bleeding   Prednisone Other (See Comments)    "makes him feel faint", hyperglycemic     Family History: Family History  Problem Relation Age of Onset   Diabetes Mother    Aneurysm Mother        Brain   Stroke Father    Stroke Other    Colon cancer Neg Hx     Social History:  reports that he has quit smoking. His smoking use included cigarettes. He has never been exposed to tobacco smoke. His smokeless tobacco use includes chew. He reports that he does not drink alcohol and does not use drugs.   Physical Exam: BP 118/73   Pulse (!) 56   Ht 6' (1.829 m)   Wt 191 lb (86.6 kg)   BMI 25.90 kg/m   Constitutional:  Alert and oriented, No acute distress. In wheelchair.  HEENT: [place] AT, moist mucus membranes.  Trachea midline, no masses. Cardiovascular: No clubbing, cyanosis, or edema. Respiratory: Normal respiratory effort, no increased work of breathing. GI: Abdomen is soft, nontender, nondistended, no abdominal masses GU: No CVA tenderness Skin: No rashes, bruises or suspicious lesions. Neurologic: Grossly intact, no focal deficits, moving all 4  extremities. Psychiatric: Normal mood and affect.  Laboratory Data: Lab Results  Component Value Date   WBC 10.5 02/03/2022   HGB 10.1 (L) 02/03/2022   HCT 30.4 (L) 02/03/2022   MCV 91.0 02/03/2022   PLT 384 02/03/2022    Lab Results  Component Value Date   CREATININE 1.93 (H) 02/03/2022    No results found for: "PSA"  No results found for: "TESTOSTERONE"  Lab Results  Component Value Date   HGBA1C 7.2 (H) 12/31/2021    Urinalysis    Component Value Date/Time  COLORURINE YELLOW 01/28/2022 1455   APPEARANCEUR Cloudy (A) 02/26/2022 1116   LABSPEC 1.010 01/28/2022 1455   PHURINE 5.0 01/28/2022 1455   GLUCOSEU Negative 02/26/2022 1116   HGBUR MODERATE (A) 01/28/2022 1455   BILIRUBINUR Negative 02/26/2022 1116   KETONESUR NEGATIVE 01/28/2022 1455   PROTEINUR 1+ (A) 02/26/2022 1116   PROTEINUR NEGATIVE 01/28/2022 1455   UROBILINOGEN 0.2 12/26/2008 0950   NITRITE Negative 02/26/2022 1116   NITRITE NEGATIVE 01/28/2022 1455   LEUKOCYTESUR 3+ (A) 02/26/2022 1116   LEUKOCYTESUR LARGE (A) 01/28/2022 1455    Lab Results  Component Value Date   LABMICR See below: 02/26/2022   WBCUA >30 (A) 02/26/2022   LABEPIT 0-10 02/26/2022   BACTERIA Many (A) 02/26/2022    Pertinent Imaging: Ct images reviewed - 2023  No results found for this or any previous visit.  Results for orders placed during the hospital encounter of 01/08/22  US Venous Img Lower Bilateral (DVT)  Narrative CLINICAL DATA:  Bilateral lower extremity edema for 1 month.  EXAM: Bilateral LOWER EXTREMITY VENOUS DOPPLER ULTRASOUND  TECHNIQUE: Gray-scale sonography with compression, as well as color and duplex ultrasound, were performed to evaluate the deep venous system(s) from the level of the common femoral vein through the popliteal and proximal calf veins.  COMPARISON:  No recent comparison imaging. Imaging from 2009 is available.  FINDINGS: VENOUS  Normal compressibility of the common  femoral, superficial femoral, and popliteal veins, as well as the visualized calf veins. Visualized portions of profunda femoral vein and great saphenous vein unremarkable. No filling defects to suggest DVT on grayscale or color Doppler imaging. Doppler waveforms show normal direction of venous flow, normal respiratory plasticity and response to augmentation.  OTHER  Edema in the lower leg bilaterally.  Limitations: Limited assessment of bilateral calf veins due to presence of lower extremity, lower leg edema.  IMPRESSION: Negative for DVT. Mildly limited with respect to calf vein assessment due to lower leg edema.   Electronically Signed By: Zetta Bills M.D. On: 01/08/2022 11:45    Assessment & Plan:    1. BPH - cont tamsulosin. Refilled and discussed nature r/b/a to alpha blockers. Otherwise CT benign.   2. Urgency - recommend good water intake. OAB meds are risky given his h/o retention.   3. Bacteriruia - asymptomatic. Will send urine for cx and consider abx, as above -  stay hydrated.   - BLADDER SCAN AMB NON-IMAGING - Urinalysis, Routine w reflex microscopic   No follow-ups on file.  Festus Aloe, MD  [facility]  [address] [place], [place] [address] [phone]

## 2022-04-22 ENCOUNTER — Inpatient Hospital Stay: Payer: PPO

## 2022-04-23 LAB — URINE CULTURE

## 2022-04-24 ENCOUNTER — Telehealth: Payer: Self-pay

## 2022-04-24 ENCOUNTER — Other Ambulatory Visit: Payer: Self-pay

## 2022-04-24 ENCOUNTER — Other Ambulatory Visit: Payer: Self-pay | Admitting: Urology

## 2022-04-24 DIAGNOSIS — M1990 Unspecified osteoarthritis, unspecified site: Secondary | ICD-10-CM | POA: Diagnosis not present

## 2022-04-24 DIAGNOSIS — Z9181 History of falling: Secondary | ICD-10-CM | POA: Diagnosis not present

## 2022-04-24 DIAGNOSIS — I130 Hypertensive heart and chronic kidney disease with heart failure and stage 1 through stage 4 chronic kidney disease, or unspecified chronic kidney disease: Secondary | ICD-10-CM | POA: Diagnosis not present

## 2022-04-24 DIAGNOSIS — Z96652 Presence of left artificial knee joint: Secondary | ICD-10-CM | POA: Diagnosis not present

## 2022-04-24 DIAGNOSIS — C884 Extranodal marginal zone B-cell lymphoma of mucosa-associated lymphoid tissue [MALT-lymphoma]: Secondary | ICD-10-CM | POA: Diagnosis not present

## 2022-04-24 DIAGNOSIS — I5043 Acute on chronic combined systolic (congestive) and diastolic (congestive) heart failure: Secondary | ICD-10-CM | POA: Diagnosis not present

## 2022-04-24 DIAGNOSIS — Z87440 Personal history of urinary (tract) infections: Secondary | ICD-10-CM | POA: Diagnosis not present

## 2022-04-24 DIAGNOSIS — N1832 Chronic kidney disease, stage 3b: Secondary | ICD-10-CM | POA: Diagnosis not present

## 2022-04-24 DIAGNOSIS — I2510 Atherosclerotic heart disease of native coronary artery without angina pectoris: Secondary | ICD-10-CM | POA: Diagnosis not present

## 2022-04-24 DIAGNOSIS — I480 Paroxysmal atrial fibrillation: Secondary | ICD-10-CM | POA: Diagnosis not present

## 2022-04-24 DIAGNOSIS — R339 Retention of urine, unspecified: Secondary | ICD-10-CM | POA: Diagnosis not present

## 2022-04-24 DIAGNOSIS — N139 Obstructive and reflux uropathy, unspecified: Secondary | ICD-10-CM | POA: Diagnosis not present

## 2022-04-24 DIAGNOSIS — E7800 Pure hypercholesterolemia, unspecified: Secondary | ICD-10-CM | POA: Diagnosis not present

## 2022-04-24 DIAGNOSIS — F411 Generalized anxiety disorder: Secondary | ICD-10-CM | POA: Diagnosis not present

## 2022-04-24 DIAGNOSIS — D509 Iron deficiency anemia, unspecified: Secondary | ICD-10-CM | POA: Diagnosis not present

## 2022-04-24 DIAGNOSIS — L89153 Pressure ulcer of sacral region, stage 3: Secondary | ICD-10-CM | POA: Diagnosis not present

## 2022-04-24 DIAGNOSIS — I080 Rheumatic disorders of both mitral and aortic valves: Secondary | ICD-10-CM | POA: Diagnosis not present

## 2022-04-24 DIAGNOSIS — Z8616 Personal history of COVID-19: Secondary | ICD-10-CM | POA: Diagnosis not present

## 2022-04-24 DIAGNOSIS — A419 Sepsis, unspecified organism: Secondary | ICD-10-CM | POA: Diagnosis not present

## 2022-04-24 DIAGNOSIS — D692 Other nonthrombocytopenic purpura: Secondary | ICD-10-CM | POA: Diagnosis not present

## 2022-04-24 DIAGNOSIS — L89621 Pressure ulcer of left heel, stage 1: Secondary | ICD-10-CM | POA: Diagnosis not present

## 2022-04-24 DIAGNOSIS — C8190 Hodgkin lymphoma, unspecified, unspecified site: Secondary | ICD-10-CM | POA: Diagnosis not present

## 2022-04-24 DIAGNOSIS — E1122 Type 2 diabetes mellitus with diabetic chronic kidney disease: Secondary | ICD-10-CM | POA: Diagnosis not present

## 2022-04-24 MED ORDER — DOXYCYCLINE HYCLATE 100 MG PO TABS: 100.0000 mg | ORAL_TABLET | Freq: Two times a day (BID) | ORAL | 0 refills | Status: DC

## 2022-04-24 NOTE — Telephone Encounter (Signed)
Wife called and made aware that antibiotic was sent to pharmacy for positive urine culture.

## 2022-04-24 NOTE — Telephone Encounter (Signed)
-----   Message from Festus Aloe, MD sent at 04/24/2022 12:24 PM EST ----- I tried to send doxycycline 100 mg po bid to his pharmacy but it printed. Can you send it in? Thanks!   ----- Message ----- From: Iris Pert, LPN Sent: 03/88/8280   4:04 PM EST To: Festus Aloe, MD  No txt started

## 2022-04-29 ENCOUNTER — Inpatient Hospital Stay: Payer: PPO

## 2022-04-29 ENCOUNTER — Inpatient Hospital Stay: Payer: PPO | Admitting: Hematology

## 2022-04-29 DIAGNOSIS — I5043 Acute on chronic combined systolic (congestive) and diastolic (congestive) heart failure: Secondary | ICD-10-CM | POA: Diagnosis not present

## 2022-04-29 DIAGNOSIS — Z8616 Personal history of COVID-19: Secondary | ICD-10-CM | POA: Diagnosis not present

## 2022-04-29 DIAGNOSIS — M1990 Unspecified osteoarthritis, unspecified site: Secondary | ICD-10-CM | POA: Diagnosis not present

## 2022-04-29 DIAGNOSIS — I130 Hypertensive heart and chronic kidney disease with heart failure and stage 1 through stage 4 chronic kidney disease, or unspecified chronic kidney disease: Secondary | ICD-10-CM | POA: Diagnosis not present

## 2022-04-29 DIAGNOSIS — N1832 Chronic kidney disease, stage 3b: Secondary | ICD-10-CM | POA: Diagnosis not present

## 2022-04-29 DIAGNOSIS — Z96652 Presence of left artificial knee joint: Secondary | ICD-10-CM | POA: Diagnosis not present

## 2022-04-29 DIAGNOSIS — D509 Iron deficiency anemia, unspecified: Secondary | ICD-10-CM | POA: Diagnosis not present

## 2022-04-29 DIAGNOSIS — F411 Generalized anxiety disorder: Secondary | ICD-10-CM | POA: Diagnosis not present

## 2022-04-29 DIAGNOSIS — I480 Paroxysmal atrial fibrillation: Secondary | ICD-10-CM | POA: Diagnosis not present

## 2022-04-29 DIAGNOSIS — L89153 Pressure ulcer of sacral region, stage 3: Secondary | ICD-10-CM | POA: Diagnosis not present

## 2022-04-29 DIAGNOSIS — E7800 Pure hypercholesterolemia, unspecified: Secondary | ICD-10-CM | POA: Diagnosis not present

## 2022-04-29 DIAGNOSIS — L89621 Pressure ulcer of left heel, stage 1: Secondary | ICD-10-CM | POA: Diagnosis not present

## 2022-04-29 DIAGNOSIS — C884 Extranodal marginal zone B-cell lymphoma of mucosa-associated lymphoid tissue [MALT-lymphoma]: Secondary | ICD-10-CM | POA: Diagnosis not present

## 2022-04-29 DIAGNOSIS — D692 Other nonthrombocytopenic purpura: Secondary | ICD-10-CM | POA: Diagnosis not present

## 2022-04-29 DIAGNOSIS — R339 Retention of urine, unspecified: Secondary | ICD-10-CM | POA: Diagnosis not present

## 2022-04-29 DIAGNOSIS — I2510 Atherosclerotic heart disease of native coronary artery without angina pectoris: Secondary | ICD-10-CM | POA: Diagnosis not present

## 2022-04-29 DIAGNOSIS — C8190 Hodgkin lymphoma, unspecified, unspecified site: Secondary | ICD-10-CM | POA: Diagnosis not present

## 2022-04-29 DIAGNOSIS — Z9181 History of falling: Secondary | ICD-10-CM | POA: Diagnosis not present

## 2022-04-29 DIAGNOSIS — N139 Obstructive and reflux uropathy, unspecified: Secondary | ICD-10-CM | POA: Diagnosis not present

## 2022-04-29 DIAGNOSIS — I080 Rheumatic disorders of both mitral and aortic valves: Secondary | ICD-10-CM | POA: Diagnosis not present

## 2022-04-29 DIAGNOSIS — E1122 Type 2 diabetes mellitus with diabetic chronic kidney disease: Secondary | ICD-10-CM | POA: Diagnosis not present

## 2022-04-29 DIAGNOSIS — Z87440 Personal history of urinary (tract) infections: Secondary | ICD-10-CM | POA: Diagnosis not present

## 2022-05-01 DIAGNOSIS — A419 Sepsis, unspecified organism: Secondary | ICD-10-CM | POA: Diagnosis not present

## 2022-05-04 DIAGNOSIS — R0602 Shortness of breath: Secondary | ICD-10-CM | POA: Diagnosis not present

## 2022-05-04 DIAGNOSIS — L8990 Pressure ulcer of unspecified site, unspecified stage: Secondary | ICD-10-CM | POA: Diagnosis not present

## 2022-05-04 DIAGNOSIS — I5031 Acute diastolic (congestive) heart failure: Secondary | ICD-10-CM | POA: Diagnosis not present

## 2022-05-04 DIAGNOSIS — I509 Heart failure, unspecified: Secondary | ICD-10-CM | POA: Diagnosis not present

## 2022-05-06 DIAGNOSIS — Z9181 History of falling: Secondary | ICD-10-CM | POA: Diagnosis not present

## 2022-05-06 DIAGNOSIS — I5043 Acute on chronic combined systolic (congestive) and diastolic (congestive) heart failure: Secondary | ICD-10-CM | POA: Diagnosis not present

## 2022-05-06 DIAGNOSIS — D692 Other nonthrombocytopenic purpura: Secondary | ICD-10-CM | POA: Diagnosis not present

## 2022-05-06 DIAGNOSIS — F411 Generalized anxiety disorder: Secondary | ICD-10-CM | POA: Diagnosis not present

## 2022-05-06 DIAGNOSIS — C8190 Hodgkin lymphoma, unspecified, unspecified site: Secondary | ICD-10-CM | POA: Diagnosis not present

## 2022-05-06 DIAGNOSIS — I080 Rheumatic disorders of both mitral and aortic valves: Secondary | ICD-10-CM | POA: Diagnosis not present

## 2022-05-06 DIAGNOSIS — I130 Hypertensive heart and chronic kidney disease with heart failure and stage 1 through stage 4 chronic kidney disease, or unspecified chronic kidney disease: Secondary | ICD-10-CM | POA: Diagnosis not present

## 2022-05-06 DIAGNOSIS — Z96652 Presence of left artificial knee joint: Secondary | ICD-10-CM | POA: Diagnosis not present

## 2022-05-06 DIAGNOSIS — I2510 Atherosclerotic heart disease of native coronary artery without angina pectoris: Secondary | ICD-10-CM | POA: Diagnosis not present

## 2022-05-06 DIAGNOSIS — Z87440 Personal history of urinary (tract) infections: Secondary | ICD-10-CM | POA: Diagnosis not present

## 2022-05-06 DIAGNOSIS — C884 Extranodal marginal zone B-cell lymphoma of mucosa-associated lymphoid tissue [MALT-lymphoma]: Secondary | ICD-10-CM | POA: Diagnosis not present

## 2022-05-06 DIAGNOSIS — I480 Paroxysmal atrial fibrillation: Secondary | ICD-10-CM | POA: Diagnosis not present

## 2022-05-06 DIAGNOSIS — L89153 Pressure ulcer of sacral region, stage 3: Secondary | ICD-10-CM | POA: Diagnosis not present

## 2022-05-06 DIAGNOSIS — A419 Sepsis, unspecified organism: Secondary | ICD-10-CM | POA: Diagnosis not present

## 2022-05-06 DIAGNOSIS — N1832 Chronic kidney disease, stage 3b: Secondary | ICD-10-CM | POA: Diagnosis not present

## 2022-05-06 DIAGNOSIS — Z8616 Personal history of COVID-19: Secondary | ICD-10-CM | POA: Diagnosis not present

## 2022-05-06 DIAGNOSIS — E7800 Pure hypercholesterolemia, unspecified: Secondary | ICD-10-CM | POA: Diagnosis not present

## 2022-05-06 DIAGNOSIS — E1122 Type 2 diabetes mellitus with diabetic chronic kidney disease: Secondary | ICD-10-CM | POA: Diagnosis not present

## 2022-05-06 DIAGNOSIS — L89621 Pressure ulcer of left heel, stage 1: Secondary | ICD-10-CM | POA: Diagnosis not present

## 2022-05-06 DIAGNOSIS — M1990 Unspecified osteoarthritis, unspecified site: Secondary | ICD-10-CM | POA: Diagnosis not present

## 2022-05-06 DIAGNOSIS — N139 Obstructive and reflux uropathy, unspecified: Secondary | ICD-10-CM | POA: Diagnosis not present

## 2022-05-06 DIAGNOSIS — R339 Retention of urine, unspecified: Secondary | ICD-10-CM | POA: Diagnosis not present

## 2022-05-06 DIAGNOSIS — D509 Iron deficiency anemia, unspecified: Secondary | ICD-10-CM | POA: Diagnosis not present

## 2022-05-07 DIAGNOSIS — I130 Hypertensive heart and chronic kidney disease with heart failure and stage 1 through stage 4 chronic kidney disease, or unspecified chronic kidney disease: Secondary | ICD-10-CM | POA: Diagnosis not present

## 2022-05-07 DIAGNOSIS — N139 Obstructive and reflux uropathy, unspecified: Secondary | ICD-10-CM | POA: Diagnosis not present

## 2022-05-07 DIAGNOSIS — Z87440 Personal history of urinary (tract) infections: Secondary | ICD-10-CM | POA: Diagnosis not present

## 2022-05-07 DIAGNOSIS — Z9181 History of falling: Secondary | ICD-10-CM | POA: Diagnosis not present

## 2022-05-07 DIAGNOSIS — R339 Retention of urine, unspecified: Secondary | ICD-10-CM | POA: Diagnosis not present

## 2022-05-07 DIAGNOSIS — M1990 Unspecified osteoarthritis, unspecified site: Secondary | ICD-10-CM | POA: Diagnosis not present

## 2022-05-07 DIAGNOSIS — I5043 Acute on chronic combined systolic (congestive) and diastolic (congestive) heart failure: Secondary | ICD-10-CM | POA: Diagnosis not present

## 2022-05-07 DIAGNOSIS — D692 Other nonthrombocytopenic purpura: Secondary | ICD-10-CM | POA: Diagnosis not present

## 2022-05-07 DIAGNOSIS — I080 Rheumatic disorders of both mitral and aortic valves: Secondary | ICD-10-CM | POA: Diagnosis not present

## 2022-05-07 DIAGNOSIS — L89621 Pressure ulcer of left heel, stage 1: Secondary | ICD-10-CM | POA: Diagnosis not present

## 2022-05-07 DIAGNOSIS — E1122 Type 2 diabetes mellitus with diabetic chronic kidney disease: Secondary | ICD-10-CM | POA: Diagnosis not present

## 2022-05-07 DIAGNOSIS — L89153 Pressure ulcer of sacral region, stage 3: Secondary | ICD-10-CM | POA: Diagnosis not present

## 2022-05-07 DIAGNOSIS — I480 Paroxysmal atrial fibrillation: Secondary | ICD-10-CM | POA: Diagnosis not present

## 2022-05-07 DIAGNOSIS — C884 Extranodal marginal zone B-cell lymphoma of mucosa-associated lymphoid tissue [MALT-lymphoma]: Secondary | ICD-10-CM | POA: Diagnosis not present

## 2022-05-07 DIAGNOSIS — E7800 Pure hypercholesterolemia, unspecified: Secondary | ICD-10-CM | POA: Diagnosis not present

## 2022-05-07 DIAGNOSIS — N1832 Chronic kidney disease, stage 3b: Secondary | ICD-10-CM | POA: Diagnosis not present

## 2022-05-07 DIAGNOSIS — I2510 Atherosclerotic heart disease of native coronary artery without angina pectoris: Secondary | ICD-10-CM | POA: Diagnosis not present

## 2022-05-07 DIAGNOSIS — D509 Iron deficiency anemia, unspecified: Secondary | ICD-10-CM | POA: Diagnosis not present

## 2022-05-07 DIAGNOSIS — F411 Generalized anxiety disorder: Secondary | ICD-10-CM | POA: Diagnosis not present

## 2022-05-07 DIAGNOSIS — Z8616 Personal history of COVID-19: Secondary | ICD-10-CM | POA: Diagnosis not present

## 2022-05-07 DIAGNOSIS — Z96652 Presence of left artificial knee joint: Secondary | ICD-10-CM | POA: Diagnosis not present

## 2022-05-07 DIAGNOSIS — C8190 Hodgkin lymphoma, unspecified, unspecified site: Secondary | ICD-10-CM | POA: Diagnosis not present

## 2022-05-12 DIAGNOSIS — I480 Paroxysmal atrial fibrillation: Secondary | ICD-10-CM | POA: Diagnosis not present

## 2022-05-12 DIAGNOSIS — C8190 Hodgkin lymphoma, unspecified, unspecified site: Secondary | ICD-10-CM | POA: Diagnosis not present

## 2022-05-12 DIAGNOSIS — L89621 Pressure ulcer of left heel, stage 1: Secondary | ICD-10-CM | POA: Diagnosis not present

## 2022-05-12 DIAGNOSIS — E1122 Type 2 diabetes mellitus with diabetic chronic kidney disease: Secondary | ICD-10-CM | POA: Diagnosis not present

## 2022-05-12 DIAGNOSIS — D509 Iron deficiency anemia, unspecified: Secondary | ICD-10-CM | POA: Diagnosis not present

## 2022-05-12 DIAGNOSIS — I2510 Atherosclerotic heart disease of native coronary artery without angina pectoris: Secondary | ICD-10-CM | POA: Diagnosis not present

## 2022-05-12 DIAGNOSIS — R339 Retention of urine, unspecified: Secondary | ICD-10-CM | POA: Diagnosis not present

## 2022-05-12 DIAGNOSIS — Z87440 Personal history of urinary (tract) infections: Secondary | ICD-10-CM | POA: Diagnosis not present

## 2022-05-12 DIAGNOSIS — N139 Obstructive and reflux uropathy, unspecified: Secondary | ICD-10-CM | POA: Diagnosis not present

## 2022-05-12 DIAGNOSIS — Z96652 Presence of left artificial knee joint: Secondary | ICD-10-CM | POA: Diagnosis not present

## 2022-05-12 DIAGNOSIS — L89153 Pressure ulcer of sacral region, stage 3: Secondary | ICD-10-CM | POA: Diagnosis not present

## 2022-05-12 DIAGNOSIS — A419 Sepsis, unspecified organism: Secondary | ICD-10-CM | POA: Diagnosis not present

## 2022-05-12 DIAGNOSIS — D692 Other nonthrombocytopenic purpura: Secondary | ICD-10-CM | POA: Diagnosis not present

## 2022-05-12 DIAGNOSIS — M1990 Unspecified osteoarthritis, unspecified site: Secondary | ICD-10-CM | POA: Diagnosis not present

## 2022-05-12 DIAGNOSIS — Z8616 Personal history of COVID-19: Secondary | ICD-10-CM | POA: Diagnosis not present

## 2022-05-12 DIAGNOSIS — N1832 Chronic kidney disease, stage 3b: Secondary | ICD-10-CM | POA: Diagnosis not present

## 2022-05-12 DIAGNOSIS — E7800 Pure hypercholesterolemia, unspecified: Secondary | ICD-10-CM | POA: Diagnosis not present

## 2022-05-12 DIAGNOSIS — Z9181 History of falling: Secondary | ICD-10-CM | POA: Diagnosis not present

## 2022-05-12 DIAGNOSIS — I080 Rheumatic disorders of both mitral and aortic valves: Secondary | ICD-10-CM | POA: Diagnosis not present

## 2022-05-12 DIAGNOSIS — F411 Generalized anxiety disorder: Secondary | ICD-10-CM | POA: Diagnosis not present

## 2022-05-12 DIAGNOSIS — I5043 Acute on chronic combined systolic (congestive) and diastolic (congestive) heart failure: Secondary | ICD-10-CM | POA: Diagnosis not present

## 2022-05-12 DIAGNOSIS — I130 Hypertensive heart and chronic kidney disease with heart failure and stage 1 through stage 4 chronic kidney disease, or unspecified chronic kidney disease: Secondary | ICD-10-CM | POA: Diagnosis not present

## 2022-05-12 DIAGNOSIS — C884 Extranodal marginal zone B-cell lymphoma of mucosa-associated lymphoid tissue [MALT-lymphoma]: Secondary | ICD-10-CM | POA: Diagnosis not present

## 2022-05-20 ENCOUNTER — Ambulatory Visit: Payer: PPO | Admitting: Cardiology

## 2022-05-20 DIAGNOSIS — E1165 Type 2 diabetes mellitus with hyperglycemia: Secondary | ICD-10-CM | POA: Diagnosis not present

## 2022-05-20 DIAGNOSIS — C884 Extranodal marginal zone B-cell lymphoma of mucosa-associated lymphoid tissue [MALT-lymphoma]: Secondary | ICD-10-CM | POA: Diagnosis not present

## 2022-05-20 DIAGNOSIS — L89620 Pressure ulcer of left heel, unstageable: Secondary | ICD-10-CM | POA: Diagnosis not present

## 2022-05-20 DIAGNOSIS — J069 Acute upper respiratory infection, unspecified: Secondary | ICD-10-CM | POA: Diagnosis not present

## 2022-05-20 NOTE — Progress Notes (Deleted)
Cardiology Office Note  Date: 05/20/2022   ID: [name], [name] Sep 10, 1940, MRN [id number]  PCP:  [name], MD  Cardiologist:  Rozann Lesches, MD Electrophysiologist:  None   No chief complaint on file.   History of Present Illness: Bradley Sheppard is an 82 y.o. male last seen in October 2023.  Past Medical History:  Diagnosis Date   (HFpEF) heart failure with preserved ejection fraction (HCC)    Anxiety    Aortic stenosis    Arthritis    Asthma    Chronic pain    CKD (chronic kidney disease) stage 3, GFR 30-59 ml/min (HCC)    Coronary atherosclerosis of native coronary artery    Multvessel, DES to LAD and RCA 8/03; EF 65% by echo 11/2014   DM2 (diabetes mellitus, type 2) (HCC)    Essential hypertension    Gastric ulcer    Gastritis    Related to NSAIDs   GERD (gastroesophageal reflux disease)    Iron deficiency anemia    Mitral valve regurgitation    Mixed hyperlipidemia    Myocardial infarction [facility])    Nephrolithiasis     Current Outpatient Medications  Medication Sig Dispense Refill   acetaminophen (TYLENOL) 500 MG tablet Take 1,000 mg by mouth every 6 (six) hours as needed for moderate pain.     aspirin EC 81 MG tablet Take 1 tablet (81 mg total) by mouth daily with breakfast. Swallow whole. 30 tablet 12   Calcium Polycarbophil (FIBER-LAX PO) Take 1 capsule by mouth daily.     carvedilol (COREG) 12.5 MG tablet Take 1 tablet (12.5 mg total) by mouth 2 (two) times daily with a meal. 60 tablet 2   doxycycline (VIBRA-TABS) 100 MG tablet Take 1 tablet (100 mg total) by mouth 2 (two) times daily. 10 tablet 0   feeding supplement (ENSURE SURGERY) LIQD Take 237 mLs by mouth 2 (two) times daily between meals.     furosemide (LASIX) 40 MG tablet Take 1 tablet (40 mg total) by mouth daily. 30 tablet 1   glipiZIDE (GLUCOTROL XL) 10 MG 24 hr tablet Take 1 tablet (10 mg total) by mouth daily with breakfast.     LORazepam (ATIVAN) 0.5 MG tablet Take 1 tablet  (0.5 mg total) by mouth at bedtime. 10 tablet 0   nitroGLYCERIN (NITROSTAT) 0.4 MG SL tablet Place 1 tablet (0.4 mg total) under the tongue every 5 (five) minutes as needed. 25 tablet 3   oxyCODONE (OXY IR/ROXICODONE) 5 MG immediate release tablet Take 0.5-1 tablets (2.5-5 mg total) by mouth every 6 (six) hours as needed for moderate pain, severe pain or breakthrough pain. 15 tablet 0   polyethylene glycol (MIRALAX / GLYCOLAX) 17 g packet Take 17 g by mouth 2 (two) times daily as needed for mild constipation. 14 each 0   rosuvastatin (CRESTOR) 5 MG tablet Take 5 mg by mouth every Wednesday.     tamsulosin (FLOMAX) 0.4 MG CAPS capsule Take 1 capsule (0.4 mg total) by mouth daily. 90 capsule 3   vitamin B-12 (CYANOCOBALAMIN) 500 MCG tablet Take 500 mcg by mouth daily.     vitamin C (ASCORBIC ACID) 500 MG tablet Take 500 mg by mouth at bedtime.     witch hazel-glycerin (TUCKS) pad Apply topically as needed for irritation or hemorrhoids. 40 each 12   Zinc Oxide 40 % PSTE Apply 1 Application topically as needed. Apply to buttock and sacrum if silicone foam is not effective due to  incontinence 453 g 1   No current facility-administered medications for this visit.   Allergies:  Nsaids and Prednisone   ROS:  Please see the history of present illness. Otherwise, complete review of systems is positive for {NONE DEFAULTED:18576}.  All other systems are reviewed and negative.   Physical Exam: VS:  There were no vitals taken for this visit., BMI There is no height or weight on file to calculate BMI.  Wt Readings from Last 3 Encounters:  04/20/22 191 lb (86.6 kg)  02/26/22 195 lb (88.5 kg)  02/25/22 185 lb 6.4 oz (84.1 kg)    General: Patient appears comfortable at rest. HEENT: Conjunctiva and lids normal, oropharynx clear with moist mucosa. Neck: Supple, no elevated JVP or carotid bruits, no thyromegaly. Lungs: Clear to auscultation, nonlabored breathing at rest. Cardiac: Regular rate and rhythm, no  S3 or significant systolic murmur, no pericardial rub. Abdomen: Soft, nontender, no hepatomegaly, bowel sounds present, no guarding or rebound. Extremities: No pitting edema, distal pulses 2+. Skin: Warm and dry. Musculoskeletal: No kyphosis. Neuropsychiatric: Alert and oriented x3, affect grossly appropriate.  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  Recent Labwork: 01/28/2022: ALT 16; AST 22 02/03/2022: B Natriuretic Peptide 545.6; BUN 59; Creatinine, Ser 1.93; Hemoglobin 10.1; Magnesium 2.0; Platelets 384; Potassium 4.5; Sodium 130  November 2023: Hemoglobin 9.7, platelets 318, BUN 59, creatinine 1.45, potassium 5.1  Other Studies Reviewed Today:  Echocardiogram 11/24/2021:  1. Left ventricular ejection fraction, by estimation, is 65 to 70%. The  left ventricle has normal function. The left ventricle has no regional  wall motion abnormalities. There is moderate concentric left ventricular  hypertrophy. Left ventricular  diastolic parameters are consistent with Grade II diastolic dysfunction  (pseudonormalization). Elevated left ventricular end-diastolic pressure.   2. Right ventricular systolic function is normal. The right ventricular  size is normal. Tricuspid regurgitation signal is inadequate for assessing  PA pressure.   3. Left atrial size was severely dilated.   4. Right atrial size was moderately dilated.   5. A small pericardial effusion is present. The pericardial effusion is  posterior to the left ventricle and anterior to the right ventricle. There  is no evidence of cardiac tamponade.   6. The mitral valve is degenerative. Trivial mitral valve regurgitation.   7. The aortic valve is tricuspid with restricted motion of the left  coronary cusp. There is moderate calcification of the aortic valve.  Moderate aortic valve stenosis. Aortic valve mean gradient measures 18.0  mmHg.  Dimentionless index 0.44.   8. The inferior vena cava is dilated in size with >50% respiratory  variability, suggesting right atrial pressure of 8 mmHg.   Assessment and Plan:    Medication Adjustments/Labs and Tests Ordered: Current medicines are reviewed at length with the patient today.  Concerns regarding medicines are outlined above.   Tests Ordered: No orders of the defined types were placed in this encounter.   Medication Changes: No orders of the defined types were placed in this encounter.   Disposition:  Follow up {follow up:15908}  Signed, Satira Sark, MD, [facility] 05/20/2022 10:04 AM    [place] at [place], [place], [place] [address] Phone: [phone]; Fax: [phone]

## 2022-05-21 DIAGNOSIS — L89621 Pressure ulcer of left heel, stage 1: Secondary | ICD-10-CM | POA: Diagnosis not present

## 2022-05-21 DIAGNOSIS — I080 Rheumatic disorders of both mitral and aortic valves: Secondary | ICD-10-CM | POA: Diagnosis not present

## 2022-05-21 DIAGNOSIS — F411 Generalized anxiety disorder: Secondary | ICD-10-CM | POA: Diagnosis not present

## 2022-05-21 DIAGNOSIS — M1990 Unspecified osteoarthritis, unspecified site: Secondary | ICD-10-CM | POA: Diagnosis not present

## 2022-05-21 DIAGNOSIS — D509 Iron deficiency anemia, unspecified: Secondary | ICD-10-CM | POA: Diagnosis not present

## 2022-05-21 DIAGNOSIS — D692 Other nonthrombocytopenic purpura: Secondary | ICD-10-CM | POA: Diagnosis not present

## 2022-05-21 DIAGNOSIS — E1122 Type 2 diabetes mellitus with diabetic chronic kidney disease: Secondary | ICD-10-CM | POA: Diagnosis not present

## 2022-05-21 DIAGNOSIS — I480 Paroxysmal atrial fibrillation: Secondary | ICD-10-CM | POA: Diagnosis not present

## 2022-05-21 DIAGNOSIS — Z87440 Personal history of urinary (tract) infections: Secondary | ICD-10-CM | POA: Diagnosis not present

## 2022-05-21 DIAGNOSIS — N1832 Chronic kidney disease, stage 3b: Secondary | ICD-10-CM | POA: Diagnosis not present

## 2022-05-21 DIAGNOSIS — E7800 Pure hypercholesterolemia, unspecified: Secondary | ICD-10-CM | POA: Diagnosis not present

## 2022-05-21 DIAGNOSIS — I5043 Acute on chronic combined systolic (congestive) and diastolic (congestive) heart failure: Secondary | ICD-10-CM | POA: Diagnosis not present

## 2022-05-21 DIAGNOSIS — N139 Obstructive and reflux uropathy, unspecified: Secondary | ICD-10-CM | POA: Diagnosis not present

## 2022-05-21 DIAGNOSIS — I2510 Atherosclerotic heart disease of native coronary artery without angina pectoris: Secondary | ICD-10-CM | POA: Diagnosis not present

## 2022-05-21 DIAGNOSIS — Z9181 History of falling: Secondary | ICD-10-CM | POA: Diagnosis not present

## 2022-05-21 DIAGNOSIS — C884 Extranodal marginal zone B-cell lymphoma of mucosa-associated lymphoid tissue [MALT-lymphoma]: Secondary | ICD-10-CM | POA: Diagnosis not present

## 2022-05-21 DIAGNOSIS — R339 Retention of urine, unspecified: Secondary | ICD-10-CM | POA: Diagnosis not present

## 2022-05-21 DIAGNOSIS — Z96652 Presence of left artificial knee joint: Secondary | ICD-10-CM | POA: Diagnosis not present

## 2022-05-21 DIAGNOSIS — C8190 Hodgkin lymphoma, unspecified, unspecified site: Secondary | ICD-10-CM | POA: Diagnosis not present

## 2022-05-21 DIAGNOSIS — I130 Hypertensive heart and chronic kidney disease with heart failure and stage 1 through stage 4 chronic kidney disease, or unspecified chronic kidney disease: Secondary | ICD-10-CM | POA: Diagnosis not present

## 2022-05-21 DIAGNOSIS — Z8616 Personal history of COVID-19: Secondary | ICD-10-CM | POA: Diagnosis not present

## 2022-05-21 DIAGNOSIS — L89153 Pressure ulcer of sacral region, stage 3: Secondary | ICD-10-CM | POA: Diagnosis not present

## 2022-05-27 ENCOUNTER — Inpatient Hospital Stay (HOSPITAL_BASED_OUTPATIENT_CLINIC_OR_DEPARTMENT_OTHER): Payer: PPO | Admitting: Hematology

## 2022-05-27 ENCOUNTER — Inpatient Hospital Stay: Payer: PPO | Attending: Hematology

## 2022-05-27 ENCOUNTER — Encounter: Payer: Self-pay | Admitting: Hematology

## 2022-05-27 VITALS — BP 121/73 | HR 69 | Temp 97.6°F | Resp 18 | Wt 205.0 lb

## 2022-05-27 DIAGNOSIS — G8929 Other chronic pain: Secondary | ICD-10-CM | POA: Insufficient documentation

## 2022-05-27 DIAGNOSIS — D472 Monoclonal gammopathy: Secondary | ICD-10-CM

## 2022-05-27 DIAGNOSIS — D649 Anemia, unspecified: Secondary | ICD-10-CM

## 2022-05-27 DIAGNOSIS — C884 Extranodal marginal zone B-cell lymphoma of mucosa-associated lymphoid tissue [MALT-lymphoma]: Secondary | ICD-10-CM

## 2022-05-27 DIAGNOSIS — Z87891 Personal history of nicotine dependence: Secondary | ICD-10-CM | POA: Diagnosis not present

## 2022-05-27 DIAGNOSIS — E538 Deficiency of other specified B group vitamins: Secondary | ICD-10-CM | POA: Diagnosis not present

## 2022-05-27 DIAGNOSIS — Z86010 Personal history of colonic polyps: Secondary | ICD-10-CM | POA: Diagnosis not present

## 2022-05-27 DIAGNOSIS — N189 Chronic kidney disease, unspecified: Secondary | ICD-10-CM | POA: Insufficient documentation

## 2022-05-27 DIAGNOSIS — K5900 Constipation, unspecified: Secondary | ICD-10-CM | POA: Insufficient documentation

## 2022-05-27 LAB — COMPREHENSIVE METABOLIC PANEL
ALT: 20 U/L (ref 0–44)
AST: 22 U/L (ref 15–41)
Albumin: 3 g/dL — ABNORMAL LOW (ref 3.5–5.0)
Alkaline Phosphatase: 90 U/L (ref 38–126)
Anion gap: 7 (ref 5–15)
BUN: 68 mg/dL — ABNORMAL HIGH (ref 8–23)
CO2: 28 mmol/L (ref 22–32)
Calcium: 8.7 mg/dL — ABNORMAL LOW (ref 8.9–10.3)
Chloride: 100 mmol/L (ref 98–111)
Creatinine, Ser: 1.88 mg/dL — ABNORMAL HIGH (ref 0.61–1.24)
GFR, Estimated: 35 mL/min — ABNORMAL LOW (ref >60)
Glucose, Bld: 95 mg/dL (ref 70–99)
Potassium: 4.4 mmol/L (ref 3.5–5.1)
Sodium: 135 mmol/L (ref 135–145)
Total Bilirubin: 0.4 mg/dL (ref 0.3–1.2)
Total Protein: 8.1 g/dL (ref 6.5–8.1)

## 2022-05-27 LAB — IRON AND TIBC
Iron: 35 ug/dL — ABNORMAL LOW (ref 45–182)
Saturation Ratios: 13 % — ABNORMAL LOW (ref 17.9–39.5)
TIBC: 261 ug/dL (ref 250–450)
UIBC: 226 ug/dL

## 2022-05-27 LAB — CBC WITH DIFFERENTIAL/PLATELET
Abs Immature Granulocytes: 0.02 10*3/uL (ref 0.00–0.07)
Basophils Absolute: 0 10*3/uL (ref 0.0–0.1)
Basophils Relative: 0 %
Eosinophils Absolute: 0.2 10*3/uL (ref 0.0–0.5)
Eosinophils Relative: 2 %
HCT: 34 % — ABNORMAL LOW (ref 39.0–52.0)
Hemoglobin: 10.9 g/dL — ABNORMAL LOW (ref 13.0–17.0)
Immature Granulocytes: 0 %
Lymphocytes Relative: 37 %
Lymphs Abs: 3.1 10*3/uL (ref 0.7–4.0)
MCH: 29.5 pg (ref 26.0–34.0)
MCHC: 32.1 g/dL (ref 30.0–36.0)
MCV: 91.9 fL (ref 80.0–100.0)
Monocytes Absolute: 0.5 10*3/uL (ref 0.1–1.0)
Monocytes Relative: 6 %
Neutro Abs: 4.6 10*3/uL (ref 1.7–7.7)
Neutrophils Relative %: 55 %
Platelets: 240 10*3/uL (ref 150–400)
RBC: 3.7 MIL/uL — ABNORMAL LOW (ref 4.22–5.81)
RDW: 13.6 % (ref 11.5–15.5)
WBC: 8.4 10*3/uL (ref 4.0–10.5)
nRBC: 0 % (ref 0.0–0.2)

## 2022-05-27 LAB — FERRITIN: Ferritin: 131 ng/mL (ref 24–336)

## 2022-05-27 LAB — LACTATE DEHYDROGENASE: LDH: 153 U/L (ref 98–192)

## 2022-05-27 NOTE — Patient Instructions (Signed)
[facility]  Discharge Instructions  You were seen and examined today by Dr. Delton Coombes.  Dr. Delton Coombes discussed your most recent lab work which revealed that everything looks good except your iron is low.  Dr. Delton Coombes is going to get you set up for an Iron infusion.  Follow-up as scheduled in 4 months.    Thank you for choosing [facility] to provide your oncology and hematology care.   To afford each patient quality time with our provider, please arrive at least 15 minutes before your scheduled appointment time. You may need to reschedule your appointment if you arrive late (10 or more minutes). Arriving late affects you and other patients whose appointments are after yours.  Also, if you miss three or more appointments without notifying the office, you may be dismissed from the clinic at the provider's discretion.    Again, thank you for choosing [facility].  Our hope is that these requests will decrease the amount of time that you wait before being seen by our physicians.   If you have a lab appointment with the [facility] please come in thru the Main Entrance and check in at the main information desk.           _____________________________________________________________  Should you have questions after your visit to [facility], please contact our office at [phone] and follow the prompts.  Our office hours are 8:00 a.m. to 4:30 p.m. Monday - Thursday and 8:00 a.m. to 2:30 p.m. Friday.  Please note that voicemails left after 4:00 p.m. may not be returned until the following business day.  We are closed weekends and all major holidays.  You do have access to a nurse 24-7, just call the main number to the clinic [phone] and do not press any options, hold on the line and a nurse will answer the phone.    For prescription refill requests, have your pharmacy contact our office and allow 72  hours.    Masks are optional in the cancer centers. If you would like for your care team to wear a mask while they are taking care of you, please let them know. You may have one support person who is at least 84 years old accompany you for your appointments.

## 2022-05-27 NOTE — Progress Notes (Signed)
Bradley Shepp[place], [place] [address]   CLINIC:  Medical Oncology/Hematology  PCP:  [name], MD [address] / [place] [address]  [phone]  REASON FOR VISIT:  Follow-up for rectal mass, MALT lymphoma, normocytic anemia, and MGUS  PRIOR THERAPY: none  CURRENT THERAPY: under work-up  INTERVAL HISTORY:  Bradley Sheppard, a 82 y.o. male, seen for follow-up of normocytic anemia, MGUS and MALT lymphoma.  Reports decrease in energy levels.  Chronic constipation is stable.  Dyspnea on exertion is also stable.  He had multiple hospitalizations in the interim.  REVIEW OF SYSTEMS:  Review of Systems  Constitutional:  Negative for appetite change.  Cardiovascular:  Positive for leg swelling.  Gastrointestinal:  Positive for constipation.  Musculoskeletal:  Positive for arthralgias (legs and back).  Neurological:  Positive for numbness (feet).  All other systems reviewed and are negative.   PAST MEDICAL/SURGICAL HISTORY:  Past Medical History:  Diagnosis Date   (HFpEF) heart failure with preserved ejection fraction (HCC)    Anxiety    Aortic stenosis    Arthritis    Asthma    Chronic pain    CKD (chronic kidney disease) stage 3, GFR 30-59 ml/min (HCC)    Coronary atherosclerosis of native coronary artery    Multvessel, DES to LAD and RCA 8/03; EF 65% by echo 11/2014   DM2 (diabetes mellitus, type 2) ([place])    Essential hypertension    Gastric ulcer    Gastritis    Related to NSAIDs   GERD (gastroesophageal reflux disease)    Iron deficiency anemia    Mitral valve regurgitation    Mixed hyperlipidemia    Myocardial infarction [facility])    Nephrolithiasis    Past Surgical History:  Procedure Laterality Date   APPENDECTOMY     BIOPSY  09/25/2021   Procedure: BIOPSY;  Surgeon: Daneil Dolin, MD;  Location: [facility] ENDO SUITE;  Service: Endoscopy;;   CARDIAC CATHETERIZATION  01/2002   90% obstruction in proximal LAD, 60 % obstruction in  proximal circumflex and 70%  in proximal RCA. LAD & RCA stented  stents x 2   COLONOSCOPY WITH PROPOFOL N/A 09/25/2021   Procedure: COLONOSCOPY WITH PROPOFOL;  Surgeon: Daneil Dolin, MD;  Location: [facility] ENDO SUITE;  Service: Endoscopy;  Laterality: N/A;  11:00am   L knee replacement  2009   Subsequent infectino requiring resectino arthroplasty with incision and drainage 8/10, and reimplantizathion arthroplasty, 9/20. 5 total surgeries.   PARTIAL PROCTECTOMY BY TEM N/A 12/31/2021   Procedure: TEM PARTIAL PROTECTOMY;  Surgeon: Michael Boston, MD;  Location: [facility] ORS;  Service: General;  Laterality: N/A;    SOCIAL HISTORY:  Social History   Socioeconomic History   Marital status: Married    Spouse name: Not on file   Number of children: Not on file   Years of education: Not on file   Highest education level: Not on file  Occupational History   Not on file  Tobacco Use   Smoking status: Former    Types: Cigarettes    Passive exposure: Never   Smokeless tobacco: Current    Types: Chew  Vaping Use   Vaping Use: Never used  Substance and Sexual Activity   Alcohol use: No   Drug use: No   Sexual activity: Not Currently  Other Topics Concern   Not on file  Social History Narrative   Full time- textiles  Social Determinants of Health   Financial Resource Strain: Not on file  Food Insecurity: No Food Insecurity (01/29/2022)   Hunger Vital Sign    Worried About Running Out of Food in the Last Year: Never true    Ran Out of Food in the Last Year: Never true  Transportation Needs: No Transportation Needs (01/29/2022)   PRAPARE - Hydrologist (Medical): No    Lack of Transportation (Non-Medical): No  Physical Activity: Not on file  Stress: Not on file  Social Connections: Not on file  Intimate Partner Violence: Not At Risk (01/29/2022)   Humiliation, Afraid, Rape, and Kick questionnaire    Fear of Current or Ex-Partner: No    Emotionally Abused: No     Physically Abused: No    Sexually Abused: No    FAMILY HISTORY:  Family History  Problem Relation Age of Onset   Diabetes Mother    Aneurysm Mother        Brain   Stroke Father    Stroke Other    Colon cancer Neg Hx     CURRENT MEDICATIONS:  Current Outpatient Medications  Medication Sig Dispense Refill   acetaminophen (TYLENOL) 500 MG tablet Take 1,000 mg by mouth every 6 (six) hours as needed for moderate pain.     aspirin EC 81 MG tablet Take 1 tablet (81 mg total) by mouth daily with breakfast. Swallow whole. 30 tablet 12   Calcium Polycarbophil (FIBER-LAX PO) Take 1 capsule by mouth daily.     carvedilol (COREG) 12.5 MG tablet Take 1 tablet (12.5 mg total) by mouth 2 (two) times daily with a meal. 60 tablet 2   feeding supplement (ENSURE SURGERY) LIQD Take 237 mLs by mouth 2 (two) times daily between meals.     furosemide (LASIX) 40 MG tablet Take 1 tablet (40 mg total) by mouth daily. 30 tablet 1   glipiZIDE (GLUCOTROL XL) 10 MG 24 hr tablet Take 1 tablet (10 mg total) by mouth daily with breakfast.     insulin glargine (LANTUS) 100 UNIT/ML injection Inject 10 Units into the skin at bedtime.     LORazepam (ATIVAN) 0.5 MG tablet Take 1 tablet (0.5 mg total) by mouth at bedtime. 10 tablet 0   metolazone (ZAROXOLYN) 5 MG tablet Take 5 mg by mouth 2 (two) times a week.     nitroGLYCERIN (NITROSTAT) 0.4 MG SL tablet Place 1 tablet (0.4 mg total) under the tongue every 5 (five) minutes as needed. 25 tablet 3   oxyCODONE (OXY IR/ROXICODONE) 5 MG immediate release tablet Take 0.5-1 tablets (2.5-5 mg total) by mouth every 6 (six) hours as needed for moderate pain, severe pain or breakthrough pain. 15 tablet 0   polyethylene glycol (MIRALAX / GLYCOLAX) 17 g packet Take 17 g by mouth 2 (two) times daily as needed for mild constipation. 14 each 0   rosuvastatin (CRESTOR) 5 MG tablet Take 5 mg by mouth every Wednesday.     tamsulosin (FLOMAX) 0.4 MG CAPS capsule Take 1 capsule (0.4 mg  total) by mouth daily. 90 capsule 3   vitamin B-12 (CYANOCOBALAMIN) 500 MCG tablet Take 500 mcg by mouth daily.     vitamin C (ASCORBIC ACID) 500 MG tablet Take 500 mg by mouth at bedtime.     witch hazel-glycerin (TUCKS) pad Apply topically as needed for irritation or hemorrhoids. 40 each 12   Zinc Oxide 40 % PSTE Apply 1 Application topically as needed. Apply to buttock and  sacrum if silicone foam is not effective due to incontinence 453 g 1   No current facility-administered medications for this visit.    ALLERGIES:  Allergies  Allergen Reactions   Nsaids Other (See Comments)    On blood thinners with h/o IDA/bleeding   Prednisone Other (See Comments)    "makes him feel faint", hyperglycemic     PHYSICAL EXAM:  Performance status (ECOG): 2 - Symptomatic, <50% confined to bed  Vitals:   05/27/22 1441  BP: 121/73  Pulse: 69  Resp: 18  Temp: 97.6 F (36.4 C)  SpO2: 100%   Wt Readings from Last 3 Encounters:  05/27/22 205 lb (93 kg)  04/20/22 191 lb (86.6 kg)  02/26/22 195 lb (88.5 kg)   Physical Exam Vitals reviewed.  Constitutional:      Appearance: Normal appearance.     Comments: In wheelchair  Cardiovascular:     Rate and Rhythm: Normal rate and regular rhythm.     Pulses: Normal pulses.     Heart sounds: Normal heart sounds.  Pulmonary:     Effort: Pulmonary effort is normal.     Breath sounds: Normal breath sounds.  Neurological:     General: No focal deficit present.     Mental Status: He is alert and oriented to person, place, and time.  Psychiatric:        Mood and Affect: Mood normal.        Behavior: Behavior normal.     LABORATORY DATA:  I have reviewed the labs as listed.     Latest Ref Rng & Units 05/27/2022   12:03 PM 02/03/2022    5:21 AM 02/02/2022    4:45 AM  CBC  WBC 4.0 - 10.5 K/uL 8.4  10.5  8.5   Hemoglobin 13.0 - 17.0 g/dL 10.9  10.1  9.3   Hematocrit 39.0 - 52.0 % 34.0  30.4  27.8   Platelets 150 - 400 K/uL 240  384  382        Latest Ref Rng & Units 05/27/2022   12:03 PM 02/03/2022    5:21 AM 02/02/2022    4:45 AM  CMP  Glucose 70 - 99 mg/dL 95  130  123   BUN 8 - 23 mg/dL 68  59  53   Creatinine 0.61 - 1.24 mg/dL 1.88  1.93  1.90   Sodium 135 - 145 mmol/L 135  130  127   Potassium 3.5 - 5.1 mmol/L 4.4  4.5  3.7   Chloride 98 - 111 mmol/L 100  90  90   CO2 22 - 32 mmol/L '28  31  31   '$ Calcium 8.9 - 10.3 mg/dL 8.7  8.6  8.0   Total Protein 6.5 - 8.1 g/dL 8.1     Total Bilirubin 0.3 - 1.2 mg/dL 0.4     Alkaline Phos 38 - 126 U/L 90     AST 15 - 41 U/L 22     ALT 0 - 44 U/L 20         Component Value Date/Time   RBC 3.70 (L) 05/27/2022 1203   MCV 91.9 05/27/2022 1203   MCV 96 12/09/2021 0948   MCH 29.5 05/27/2022 1203   MCHC 32.1 05/27/2022 1203   RDW 13.6 05/27/2022 1203   RDW 12.8 12/09/2021 0948   LYMPHSABS 3.1 05/27/2022 1203   MONOABS 0.5 05/27/2022 1203   EOSABS 0.2 05/27/2022 1203   BASOSABS 0.0 05/27/2022 1203    DIAGNOSTIC IMAGING:  I have independently reviewed the scans and discussed with the patient. No results found.   ASSESSMENT:  Normocytic anemia: - Patient seen at the request of Dr. Woody Seller - CBC on 06/12/2021 with hemoglobin 9.1, MCV 94 with normal white count and platelet count.  Creatinine was 1.4. - CBC on 05/26/2021: Hemoglobin-10, creatinine 1.56 - CBC on 02/24/2021: Hemoglobin-9.6 - He reports that he has received Feraheme in December 2020 in [place] without any major problems. - He is currently taking iron tablet twice daily.  Mild constipation. - Last colonoscopy in 2010 with polyps. - BMBX (09/02/2021): Non-Hodgkin's lymphoma, favoring MALT lymphoma.  MYD 88 was negative. - Cytogenetics: 57, XY. - Myeloma FISH panel: Negative. - FISH for low-grade lymphoma: Negative for BCL6 rearrangement, MALT1 rearrangement, t(11;14) and t(14;18).    Social/family history: - He lives at home with his wife.  He walks with help of walker since his left knee replacement and multiple infections  following it. 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 - He quit smoking in 1984.  But chews tobacco at this time. - No family history of cancers.   PLAN:  [name]: -Bone marrow biopsy on 09/02/2021 showed non-Hodgkin's lymphoma with differential of MALT lymphoma. - PET scan did not show any skeletal lesions or adenopathy. - No B symptoms at this time.  LDH is normal.  Will continue to monitor. - RTC 4 months with repeat labs.  2.  Normocytic anemia: - Anemia from CKD and relative iron deficiency. - Hemoglobin today is 10.9.  Ferritin is 131 and percent saturation 13. - Recommend 1 dose of Feraheme or Venofer 400 mg.  Will plan to repeat ferritin and iron panel in 4 months.  3.  IgG kappa monoclonal gammopathy: - Previous IgG M spike was 1.4 g.  Will plan to repeat in 4 months.  Previous PET scan did not show any lesions.  4.  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   Orders placed this encounter:  No orders of the defined types were placed in this encounter.     Derek Jack, MD [place] [phone]

## 2022-05-28 DIAGNOSIS — I080 Rheumatic disorders of both mitral and aortic valves: Secondary | ICD-10-CM | POA: Diagnosis not present

## 2022-05-28 DIAGNOSIS — Z9181 History of falling: Secondary | ICD-10-CM | POA: Diagnosis not present

## 2022-05-28 DIAGNOSIS — R339 Retention of urine, unspecified: Secondary | ICD-10-CM | POA: Diagnosis not present

## 2022-05-28 DIAGNOSIS — N139 Obstructive and reflux uropathy, unspecified: Secondary | ICD-10-CM | POA: Diagnosis not present

## 2022-05-28 DIAGNOSIS — I130 Hypertensive heart and chronic kidney disease with heart failure and stage 1 through stage 4 chronic kidney disease, or unspecified chronic kidney disease: Secondary | ICD-10-CM | POA: Diagnosis not present

## 2022-05-28 DIAGNOSIS — C884 Extranodal marginal zone B-cell lymphoma of mucosa-associated lymphoid tissue [MALT-lymphoma]: Secondary | ICD-10-CM | POA: Diagnosis not present

## 2022-05-28 DIAGNOSIS — M1990 Unspecified osteoarthritis, unspecified site: Secondary | ICD-10-CM | POA: Diagnosis not present

## 2022-05-28 DIAGNOSIS — Z8616 Personal history of COVID-19: Secondary | ICD-10-CM | POA: Diagnosis not present

## 2022-05-28 DIAGNOSIS — Z96652 Presence of left artificial knee joint: Secondary | ICD-10-CM | POA: Diagnosis not present

## 2022-05-28 DIAGNOSIS — L89622 Pressure ulcer of left heel, stage 2: Secondary | ICD-10-CM | POA: Diagnosis not present

## 2022-05-28 DIAGNOSIS — E7800 Pure hypercholesterolemia, unspecified: Secondary | ICD-10-CM | POA: Diagnosis not present

## 2022-05-28 DIAGNOSIS — N1832 Chronic kidney disease, stage 3b: Secondary | ICD-10-CM | POA: Diagnosis not present

## 2022-05-28 DIAGNOSIS — I5043 Acute on chronic combined systolic (congestive) and diastolic (congestive) heart failure: Secondary | ICD-10-CM | POA: Diagnosis not present

## 2022-05-28 DIAGNOSIS — Z87440 Personal history of urinary (tract) infections: Secondary | ICD-10-CM | POA: Diagnosis not present

## 2022-05-28 DIAGNOSIS — L89153 Pressure ulcer of sacral region, stage 3: Secondary | ICD-10-CM | POA: Diagnosis not present

## 2022-05-28 DIAGNOSIS — D509 Iron deficiency anemia, unspecified: Secondary | ICD-10-CM | POA: Diagnosis not present

## 2022-05-28 DIAGNOSIS — C8190 Hodgkin lymphoma, unspecified, unspecified site: Secondary | ICD-10-CM | POA: Diagnosis not present

## 2022-05-28 DIAGNOSIS — Z7984 Long term (current) use of oral hypoglycemic drugs: Secondary | ICD-10-CM | POA: Diagnosis not present

## 2022-05-28 DIAGNOSIS — Z7982 Long term (current) use of aspirin: Secondary | ICD-10-CM | POA: Diagnosis not present

## 2022-05-28 DIAGNOSIS — F411 Generalized anxiety disorder: Secondary | ICD-10-CM | POA: Diagnosis not present

## 2022-05-28 DIAGNOSIS — D692 Other nonthrombocytopenic purpura: Secondary | ICD-10-CM | POA: Diagnosis not present

## 2022-05-28 DIAGNOSIS — E1122 Type 2 diabetes mellitus with diabetic chronic kidney disease: Secondary | ICD-10-CM | POA: Diagnosis not present

## 2022-05-28 DIAGNOSIS — I480 Paroxysmal atrial fibrillation: Secondary | ICD-10-CM | POA: Diagnosis not present

## 2022-05-28 DIAGNOSIS — I2510 Atherosclerotic heart disease of native coronary artery without angina pectoris: Secondary | ICD-10-CM | POA: Diagnosis not present

## 2022-06-03 ENCOUNTER — Inpatient Hospital Stay: Payer: PPO

## 2022-06-03 DIAGNOSIS — E1122 Type 2 diabetes mellitus with diabetic chronic kidney disease: Secondary | ICD-10-CM | POA: Diagnosis not present

## 2022-06-03 DIAGNOSIS — I5043 Acute on chronic combined systolic (congestive) and diastolic (congestive) heart failure: Secondary | ICD-10-CM | POA: Diagnosis not present

## 2022-06-03 DIAGNOSIS — L89622 Pressure ulcer of left heel, stage 2: Secondary | ICD-10-CM | POA: Diagnosis not present

## 2022-06-03 DIAGNOSIS — I130 Hypertensive heart and chronic kidney disease with heart failure and stage 1 through stage 4 chronic kidney disease, or unspecified chronic kidney disease: Secondary | ICD-10-CM | POA: Diagnosis not present

## 2022-06-04 DIAGNOSIS — I5031 Acute diastolic (congestive) heart failure: Secondary | ICD-10-CM | POA: Diagnosis not present

## 2022-06-04 DIAGNOSIS — E7800 Pure hypercholesterolemia, unspecified: Secondary | ICD-10-CM | POA: Diagnosis not present

## 2022-06-04 DIAGNOSIS — Z9181 History of falling: Secondary | ICD-10-CM | POA: Diagnosis not present

## 2022-06-04 DIAGNOSIS — M1990 Unspecified osteoarthritis, unspecified site: Secondary | ICD-10-CM | POA: Diagnosis not present

## 2022-06-04 DIAGNOSIS — D692 Other nonthrombocytopenic purpura: Secondary | ICD-10-CM | POA: Diagnosis not present

## 2022-06-04 DIAGNOSIS — C8190 Hodgkin lymphoma, unspecified, unspecified site: Secondary | ICD-10-CM | POA: Diagnosis not present

## 2022-06-04 DIAGNOSIS — L89622 Pressure ulcer of left heel, stage 2: Secondary | ICD-10-CM | POA: Diagnosis not present

## 2022-06-04 DIAGNOSIS — Z8616 Personal history of COVID-19: Secondary | ICD-10-CM | POA: Diagnosis not present

## 2022-06-04 DIAGNOSIS — N139 Obstructive and reflux uropathy, unspecified: Secondary | ICD-10-CM | POA: Diagnosis not present

## 2022-06-04 DIAGNOSIS — Z87440 Personal history of urinary (tract) infections: Secondary | ICD-10-CM | POA: Diagnosis not present

## 2022-06-04 DIAGNOSIS — D509 Iron deficiency anemia, unspecified: Secondary | ICD-10-CM | POA: Diagnosis not present

## 2022-06-04 DIAGNOSIS — I480 Paroxysmal atrial fibrillation: Secondary | ICD-10-CM | POA: Diagnosis not present

## 2022-06-04 DIAGNOSIS — Z96652 Presence of left artificial knee joint: Secondary | ICD-10-CM | POA: Diagnosis not present

## 2022-06-04 DIAGNOSIS — L8990 Pressure ulcer of unspecified site, unspecified stage: Secondary | ICD-10-CM | POA: Diagnosis not present

## 2022-06-04 DIAGNOSIS — L89153 Pressure ulcer of sacral region, stage 3: Secondary | ICD-10-CM | POA: Diagnosis not present

## 2022-06-04 DIAGNOSIS — Z7984 Long term (current) use of oral hypoglycemic drugs: Secondary | ICD-10-CM | POA: Diagnosis not present

## 2022-06-04 DIAGNOSIS — R339 Retention of urine, unspecified: Secondary | ICD-10-CM | POA: Diagnosis not present

## 2022-06-04 DIAGNOSIS — R0602 Shortness of breath: Secondary | ICD-10-CM | POA: Diagnosis not present

## 2022-06-04 DIAGNOSIS — N1832 Chronic kidney disease, stage 3b: Secondary | ICD-10-CM | POA: Diagnosis not present

## 2022-06-04 DIAGNOSIS — F411 Generalized anxiety disorder: Secondary | ICD-10-CM | POA: Diagnosis not present

## 2022-06-04 DIAGNOSIS — E1122 Type 2 diabetes mellitus with diabetic chronic kidney disease: Secondary | ICD-10-CM | POA: Diagnosis not present

## 2022-06-04 DIAGNOSIS — I080 Rheumatic disorders of both mitral and aortic valves: Secondary | ICD-10-CM | POA: Diagnosis not present

## 2022-06-04 DIAGNOSIS — Z7982 Long term (current) use of aspirin: Secondary | ICD-10-CM | POA: Diagnosis not present

## 2022-06-04 DIAGNOSIS — I2510 Atherosclerotic heart disease of native coronary artery without angina pectoris: Secondary | ICD-10-CM | POA: Diagnosis not present

## 2022-06-04 DIAGNOSIS — C884 Extranodal marginal zone B-cell lymphoma of mucosa-associated lymphoid tissue [MALT-lymphoma]: Secondary | ICD-10-CM | POA: Diagnosis not present

## 2022-06-04 DIAGNOSIS — I5043 Acute on chronic combined systolic (congestive) and diastolic (congestive) heart failure: Secondary | ICD-10-CM | POA: Diagnosis not present

## 2022-06-04 DIAGNOSIS — I509 Heart failure, unspecified: Secondary | ICD-10-CM | POA: Diagnosis not present

## 2022-06-04 DIAGNOSIS — I130 Hypertensive heart and chronic kidney disease with heart failure and stage 1 through stage 4 chronic kidney disease, or unspecified chronic kidney disease: Secondary | ICD-10-CM | POA: Diagnosis not present

## 2022-06-05 DIAGNOSIS — I10 Essential (primary) hypertension: Secondary | ICD-10-CM | POA: Diagnosis not present

## 2022-06-05 DIAGNOSIS — E1122 Type 2 diabetes mellitus with diabetic chronic kidney disease: Secondary | ICD-10-CM | POA: Diagnosis not present

## 2022-06-05 DIAGNOSIS — E1165 Type 2 diabetes mellitus with hyperglycemia: Secondary | ICD-10-CM | POA: Diagnosis not present

## 2022-06-05 DIAGNOSIS — Z1339 Encounter for screening examination for other mental health and behavioral disorders: Secondary | ICD-10-CM | POA: Diagnosis not present

## 2022-06-05 DIAGNOSIS — E7800 Pure hypercholesterolemia, unspecified: Secondary | ICD-10-CM | POA: Diagnosis not present

## 2022-06-05 DIAGNOSIS — Z1331 Encounter for screening for depression: Secondary | ICD-10-CM | POA: Diagnosis not present

## 2022-06-05 DIAGNOSIS — Z79899 Other long term (current) drug therapy: Secondary | ICD-10-CM | POA: Diagnosis not present

## 2022-06-05 DIAGNOSIS — R5383 Other fatigue: Secondary | ICD-10-CM | POA: Diagnosis not present

## 2022-06-05 DIAGNOSIS — I25119 Atherosclerotic heart disease of native coronary artery with unspecified angina pectoris: Secondary | ICD-10-CM | POA: Diagnosis not present

## 2022-06-05 DIAGNOSIS — Z7189 Other specified counseling: Secondary | ICD-10-CM | POA: Diagnosis not present

## 2022-06-05 DIAGNOSIS — Z125 Encounter for screening for malignant neoplasm of prostate: Secondary | ICD-10-CM | POA: Diagnosis not present

## 2022-06-05 DIAGNOSIS — Z299 Encounter for prophylactic measures, unspecified: Secondary | ICD-10-CM | POA: Diagnosis not present

## 2022-06-05 DIAGNOSIS — I5032 Chronic diastolic (congestive) heart failure: Secondary | ICD-10-CM | POA: Diagnosis not present

## 2022-06-05 DIAGNOSIS — Z0000 Encounter for general adult medical examination without abnormal findings: Secondary | ICD-10-CM | POA: Diagnosis not present

## 2022-06-10 ENCOUNTER — Ambulatory Visit: Payer: PPO

## 2022-06-11 ENCOUNTER — Encounter: Payer: Self-pay | Admitting: Hematology

## 2022-06-16 ENCOUNTER — Inpatient Hospital Stay: Payer: HMO | Attending: Hematology

## 2022-06-16 ENCOUNTER — Encounter: Payer: Self-pay | Admitting: Hematology

## 2022-06-16 VITALS — BP 114/69 | HR 76 | Temp 95.6°F | Resp 20

## 2022-06-16 DIAGNOSIS — D472 Monoclonal gammopathy: Secondary | ICD-10-CM | POA: Diagnosis not present

## 2022-06-16 DIAGNOSIS — D631 Anemia in chronic kidney disease: Secondary | ICD-10-CM | POA: Insufficient documentation

## 2022-06-16 DIAGNOSIS — N189 Chronic kidney disease, unspecified: Secondary | ICD-10-CM | POA: Insufficient documentation

## 2022-06-16 DIAGNOSIS — D508 Other iron deficiency anemias: Secondary | ICD-10-CM

## 2022-06-16 MED ORDER — SODIUM CHLORIDE 0.9 % IV SOLN: Freq: Once | INTRAVENOUS | Status: AC

## 2022-06-16 MED ORDER — ACETAMINOPHEN 325 MG PO TABS
650.0000 mg | ORAL_TABLET | Freq: Once | ORAL | Status: AC
  Administered 2022-06-16: 650 mg via ORAL
  Filled 2022-06-16: qty 2

## 2022-06-16 MED ORDER — CETIRIZINE HCL 10 MG PO TABS
10.0000 mg | ORAL_TABLET | Freq: Once | ORAL | Status: AC
  Administered 2022-06-16: 10 mg via ORAL
  Filled 2022-06-16: qty 1

## 2022-06-16 MED ORDER — SODIUM CHLORIDE 0.9 % IV SOLN
510.0000 mg | Freq: Once | INTRAVENOUS | Status: AC
  Administered 2022-06-16: 510 mg via INTRAVENOUS
  Filled 2022-06-16: qty 510

## 2022-06-16 NOTE — Progress Notes (Signed)
Pt presents today for Feraheme IV iron infusion per provider's order.Vital signs stable and pt voiced no new complaints at this time.  Peripheral IV started with good blood return pre and post infusion.  Feraheme given today per MD orders. Tolerated infusion without adverse affects. Vital signs stable. No complaints at this time. Discharged from clinic via wheelchair in stable condition. Alert and oriented x 3. F/U with [facility] as scheduled.     

## 2022-06-16 NOTE — Patient Instructions (Signed)
[facility]  Discharge Instructions: Thank you for choosing [facility] to provide your oncology and hematology care.  If you have a lab appointment with the [facility], please come in thru the Main Entrance and check in at the main information desk.  Wear comfortable clothing and clothing appropriate for easy access to any Portacath or PICC line.   We strive to give you quality time with your provider. You may need to reschedule your appointment if you arrive late (15 or more minutes).  Arriving late affects you and other patients whose appointments are after yours.  Also, if you miss three or more appointments without notifying the office, you may be dismissed from the clinic at the provider's discretion.      For prescription refill requests, have your pharmacy contact our office and allow 72 hours for refills to be completed.    Today you received Feraheme IV iron.     BELOW ARE SYMPTOMS THAT SHOULD BE REPORTED IMMEDIATELY: *FEVER GREATER THAN 100.4 F (38 C) OR HIGHER *CHILLS OR SWEATING *NAUSEA AND VOMITING THAT IS NOT CONTROLLED WITH YOUR NAUSEA MEDICATION *UNUSUAL SHORTNESS OF BREATH *UNUSUAL BRUISING OR BLEEDING *URINARY PROBLEMS (pain or burning when urinating, or frequent urination) *BOWEL PROBLEMS (unusual diarrhea, constipation, pain near the anus) TENDERNESS IN MOUTH AND THROAT WITH OR WITHOUT PRESENCE OF ULCERS (sore throat, sores in mouth, or a toothache) UNUSUAL RASH, SWELLING OR PAIN  UNUSUAL VAGINAL DISCHARGE OR ITCHING   Items with * indicate a potential emergency and should be followed up as soon as possible or go to the Emergency Department if any problems should occur.  Please show the CHEMOTHERAPY ALERT CARD or IMMUNOTHERAPY ALERT CARD at check-in to the Emergency Department and triage nurse.  Should you have questions after your visit or need to cancel or reschedule your appointment, please contact [facility] [phone]  and follow the prompts.  Office hours are 8:00 a.m. to 4:30 p.m. Monday - Friday. Please note that voicemails left after 4:00 p.m. may not be returned until the following business day.  We are closed weekends and major holidays. You have access to a nurse at all times for urgent questions. Please call the main number to the clinic [phone] and follow the prompts.  For any non-urgent questions, you may also contact your provider using MyChart. We now offer e-Visits for anyone 69 and older to request care online for non-urgent symptoms. For details visit [email or web address].   Also download the MyChart app! Go to the app store, search "MyChart", open the app, select [facility], and log in with your MyChart username and password.

## 2022-06-17 ENCOUNTER — Encounter: Payer: Self-pay | Admitting: Hematology

## 2022-06-19 ENCOUNTER — Ambulatory Visit (HOSPITAL_COMMUNITY): Payer: HMO

## 2022-06-22 ENCOUNTER — Ambulatory Visit: Payer: PPO | Admitting: Pulmonary Disease

## 2022-07-03 DIAGNOSIS — M1990 Unspecified osteoarthritis, unspecified site: Secondary | ICD-10-CM | POA: Diagnosis not present

## 2022-07-03 DIAGNOSIS — N1832 Chronic kidney disease, stage 3b: Secondary | ICD-10-CM | POA: Diagnosis not present

## 2022-07-03 DIAGNOSIS — I080 Rheumatic disorders of both mitral and aortic valves: Secondary | ICD-10-CM | POA: Diagnosis not present

## 2022-07-03 DIAGNOSIS — D692 Other nonthrombocytopenic purpura: Secondary | ICD-10-CM | POA: Diagnosis not present

## 2022-07-03 DIAGNOSIS — C884 Extranodal marginal zone B-cell lymphoma of mucosa-associated lymphoid tissue [MALT-lymphoma]: Secondary | ICD-10-CM | POA: Diagnosis not present

## 2022-07-03 DIAGNOSIS — I509 Heart failure, unspecified: Secondary | ICD-10-CM | POA: Diagnosis not present

## 2022-07-03 DIAGNOSIS — Z9181 History of falling: Secondary | ICD-10-CM | POA: Diagnosis not present

## 2022-07-03 DIAGNOSIS — R339 Retention of urine, unspecified: Secondary | ICD-10-CM | POA: Diagnosis not present

## 2022-07-03 DIAGNOSIS — I5031 Acute diastolic (congestive) heart failure: Secondary | ICD-10-CM | POA: Diagnosis not present

## 2022-07-03 DIAGNOSIS — F411 Generalized anxiety disorder: Secondary | ICD-10-CM | POA: Diagnosis not present

## 2022-07-03 DIAGNOSIS — C8190 Hodgkin lymphoma, unspecified, unspecified site: Secondary | ICD-10-CM | POA: Diagnosis not present

## 2022-07-03 DIAGNOSIS — E1122 Type 2 diabetes mellitus with diabetic chronic kidney disease: Secondary | ICD-10-CM | POA: Diagnosis not present

## 2022-07-03 DIAGNOSIS — Z87440 Personal history of urinary (tract) infections: Secondary | ICD-10-CM | POA: Diagnosis not present

## 2022-07-03 DIAGNOSIS — D509 Iron deficiency anemia, unspecified: Secondary | ICD-10-CM | POA: Diagnosis not present

## 2022-07-03 DIAGNOSIS — Z7982 Long term (current) use of aspirin: Secondary | ICD-10-CM | POA: Diagnosis not present

## 2022-07-03 DIAGNOSIS — L89622 Pressure ulcer of left heel, stage 2: Secondary | ICD-10-CM | POA: Diagnosis not present

## 2022-07-03 DIAGNOSIS — Z96652 Presence of left artificial knee joint: Secondary | ICD-10-CM | POA: Diagnosis not present

## 2022-07-03 DIAGNOSIS — I5043 Acute on chronic combined systolic (congestive) and diastolic (congestive) heart failure: Secondary | ICD-10-CM | POA: Diagnosis not present

## 2022-07-03 DIAGNOSIS — E7800 Pure hypercholesterolemia, unspecified: Secondary | ICD-10-CM | POA: Diagnosis not present

## 2022-07-03 DIAGNOSIS — R0602 Shortness of breath: Secondary | ICD-10-CM | POA: Diagnosis not present

## 2022-07-03 DIAGNOSIS — I480 Paroxysmal atrial fibrillation: Secondary | ICD-10-CM | POA: Diagnosis not present

## 2022-07-03 DIAGNOSIS — I2510 Atherosclerotic heart disease of native coronary artery without angina pectoris: Secondary | ICD-10-CM | POA: Diagnosis not present

## 2022-07-03 DIAGNOSIS — L8990 Pressure ulcer of unspecified site, unspecified stage: Secondary | ICD-10-CM | POA: Diagnosis not present

## 2022-07-03 DIAGNOSIS — N139 Obstructive and reflux uropathy, unspecified: Secondary | ICD-10-CM | POA: Diagnosis not present

## 2022-07-03 DIAGNOSIS — I130 Hypertensive heart and chronic kidney disease with heart failure and stage 1 through stage 4 chronic kidney disease, or unspecified chronic kidney disease: Secondary | ICD-10-CM | POA: Diagnosis not present

## 2022-07-03 DIAGNOSIS — Z8616 Personal history of COVID-19: Secondary | ICD-10-CM | POA: Diagnosis not present

## 2022-07-03 DIAGNOSIS — Z7984 Long term (current) use of oral hypoglycemic drugs: Secondary | ICD-10-CM | POA: Diagnosis not present

## 2022-07-03 DIAGNOSIS — L89153 Pressure ulcer of sacral region, stage 3: Secondary | ICD-10-CM | POA: Diagnosis not present

## 2022-07-07 DIAGNOSIS — I080 Rheumatic disorders of both mitral and aortic valves: Secondary | ICD-10-CM | POA: Diagnosis not present

## 2022-07-07 DIAGNOSIS — D692 Other nonthrombocytopenic purpura: Secondary | ICD-10-CM | POA: Diagnosis not present

## 2022-07-07 DIAGNOSIS — L89622 Pressure ulcer of left heel, stage 2: Secondary | ICD-10-CM | POA: Diagnosis not present

## 2022-07-07 DIAGNOSIS — F411 Generalized anxiety disorder: Secondary | ICD-10-CM | POA: Diagnosis not present

## 2022-07-07 DIAGNOSIS — Z96652 Presence of left artificial knee joint: Secondary | ICD-10-CM | POA: Diagnosis not present

## 2022-07-07 DIAGNOSIS — E7800 Pure hypercholesterolemia, unspecified: Secondary | ICD-10-CM | POA: Diagnosis not present

## 2022-07-07 DIAGNOSIS — D509 Iron deficiency anemia, unspecified: Secondary | ICD-10-CM | POA: Diagnosis not present

## 2022-07-07 DIAGNOSIS — I5032 Chronic diastolic (congestive) heart failure: Secondary | ICD-10-CM | POA: Diagnosis not present

## 2022-07-07 DIAGNOSIS — C884 Extranodal marginal zone B-cell lymphoma of mucosa-associated lymphoid tissue [MALT-lymphoma]: Secondary | ICD-10-CM | POA: Diagnosis not present

## 2022-07-07 DIAGNOSIS — M1990 Unspecified osteoarthritis, unspecified site: Secondary | ICD-10-CM | POA: Diagnosis not present

## 2022-07-07 DIAGNOSIS — N139 Obstructive and reflux uropathy, unspecified: Secondary | ICD-10-CM | POA: Diagnosis not present

## 2022-07-07 DIAGNOSIS — R339 Retention of urine, unspecified: Secondary | ICD-10-CM | POA: Diagnosis not present

## 2022-07-07 DIAGNOSIS — I130 Hypertensive heart and chronic kidney disease with heart failure and stage 1 through stage 4 chronic kidney disease, or unspecified chronic kidney disease: Secondary | ICD-10-CM | POA: Diagnosis not present

## 2022-07-07 DIAGNOSIS — Z8616 Personal history of COVID-19: Secondary | ICD-10-CM | POA: Diagnosis not present

## 2022-07-07 DIAGNOSIS — Z87440 Personal history of urinary (tract) infections: Secondary | ICD-10-CM | POA: Diagnosis not present

## 2022-07-07 DIAGNOSIS — I480 Paroxysmal atrial fibrillation: Secondary | ICD-10-CM | POA: Diagnosis not present

## 2022-07-07 DIAGNOSIS — J069 Acute upper respiratory infection, unspecified: Secondary | ICD-10-CM | POA: Diagnosis not present

## 2022-07-07 DIAGNOSIS — R600 Localized edema: Secondary | ICD-10-CM | POA: Diagnosis not present

## 2022-07-07 DIAGNOSIS — I2510 Atherosclerotic heart disease of native coronary artery without angina pectoris: Secondary | ICD-10-CM | POA: Diagnosis not present

## 2022-07-07 DIAGNOSIS — N1832 Chronic kidney disease, stage 3b: Secondary | ICD-10-CM | POA: Diagnosis not present

## 2022-07-07 DIAGNOSIS — Z7982 Long term (current) use of aspirin: Secondary | ICD-10-CM | POA: Diagnosis not present

## 2022-07-07 DIAGNOSIS — C8190 Hodgkin lymphoma, unspecified, unspecified site: Secondary | ICD-10-CM | POA: Diagnosis not present

## 2022-07-07 DIAGNOSIS — Z7984 Long term (current) use of oral hypoglycemic drugs: Secondary | ICD-10-CM | POA: Diagnosis not present

## 2022-07-07 DIAGNOSIS — I5043 Acute on chronic combined systolic (congestive) and diastolic (congestive) heart failure: Secondary | ICD-10-CM | POA: Diagnosis not present

## 2022-07-07 DIAGNOSIS — E1122 Type 2 diabetes mellitus with diabetic chronic kidney disease: Secondary | ICD-10-CM | POA: Diagnosis not present

## 2022-07-07 DIAGNOSIS — L89153 Pressure ulcer of sacral region, stage 3: Secondary | ICD-10-CM | POA: Diagnosis not present

## 2022-07-07 DIAGNOSIS — Z9181 History of falling: Secondary | ICD-10-CM | POA: Diagnosis not present

## 2022-07-11 DIAGNOSIS — R21 Rash and other nonspecific skin eruption: Secondary | ICD-10-CM | POA: Diagnosis not present

## 2022-07-11 DIAGNOSIS — B029 Zoster without complications: Secondary | ICD-10-CM | POA: Diagnosis not present

## 2022-07-13 DIAGNOSIS — R21 Rash and other nonspecific skin eruption: Secondary | ICD-10-CM | POA: Diagnosis not present

## 2022-07-13 DIAGNOSIS — B029 Zoster without complications: Secondary | ICD-10-CM | POA: Diagnosis not present

## 2022-07-17 DIAGNOSIS — C8190 Hodgkin lymphoma, unspecified, unspecified site: Secondary | ICD-10-CM | POA: Diagnosis not present

## 2022-07-17 DIAGNOSIS — I5043 Acute on chronic combined systolic (congestive) and diastolic (congestive) heart failure: Secondary | ICD-10-CM | POA: Diagnosis not present

## 2022-07-17 DIAGNOSIS — L89622 Pressure ulcer of left heel, stage 2: Secondary | ICD-10-CM | POA: Diagnosis not present

## 2022-07-17 DIAGNOSIS — E1122 Type 2 diabetes mellitus with diabetic chronic kidney disease: Secondary | ICD-10-CM | POA: Diagnosis not present

## 2022-07-17 DIAGNOSIS — I480 Paroxysmal atrial fibrillation: Secondary | ICD-10-CM | POA: Diagnosis not present

## 2022-07-17 DIAGNOSIS — Z7984 Long term (current) use of oral hypoglycemic drugs: Secondary | ICD-10-CM | POA: Diagnosis not present

## 2022-07-17 DIAGNOSIS — I080 Rheumatic disorders of both mitral and aortic valves: Secondary | ICD-10-CM | POA: Diagnosis not present

## 2022-07-17 DIAGNOSIS — E7800 Pure hypercholesterolemia, unspecified: Secondary | ICD-10-CM | POA: Diagnosis not present

## 2022-07-17 DIAGNOSIS — Z96652 Presence of left artificial knee joint: Secondary | ICD-10-CM | POA: Diagnosis not present

## 2022-07-17 DIAGNOSIS — I130 Hypertensive heart and chronic kidney disease with heart failure and stage 1 through stage 4 chronic kidney disease, or unspecified chronic kidney disease: Secondary | ICD-10-CM | POA: Diagnosis not present

## 2022-07-17 DIAGNOSIS — D509 Iron deficiency anemia, unspecified: Secondary | ICD-10-CM | POA: Diagnosis not present

## 2022-07-17 DIAGNOSIS — Z9181 History of falling: Secondary | ICD-10-CM | POA: Diagnosis not present

## 2022-07-17 DIAGNOSIS — C884 Extranodal marginal zone B-cell lymphoma of mucosa-associated lymphoid tissue [MALT-lymphoma]: Secondary | ICD-10-CM | POA: Diagnosis not present

## 2022-07-17 DIAGNOSIS — N139 Obstructive and reflux uropathy, unspecified: Secondary | ICD-10-CM | POA: Diagnosis not present

## 2022-07-17 DIAGNOSIS — R339 Retention of urine, unspecified: Secondary | ICD-10-CM | POA: Diagnosis not present

## 2022-07-17 DIAGNOSIS — I2510 Atherosclerotic heart disease of native coronary artery without angina pectoris: Secondary | ICD-10-CM | POA: Diagnosis not present

## 2022-07-17 DIAGNOSIS — Z7982 Long term (current) use of aspirin: Secondary | ICD-10-CM | POA: Diagnosis not present

## 2022-07-17 DIAGNOSIS — N1832 Chronic kidney disease, stage 3b: Secondary | ICD-10-CM | POA: Diagnosis not present

## 2022-07-17 DIAGNOSIS — Z87440 Personal history of urinary (tract) infections: Secondary | ICD-10-CM | POA: Diagnosis not present

## 2022-07-17 DIAGNOSIS — L89153 Pressure ulcer of sacral region, stage 3: Secondary | ICD-10-CM | POA: Diagnosis not present

## 2022-07-17 DIAGNOSIS — F411 Generalized anxiety disorder: Secondary | ICD-10-CM | POA: Diagnosis not present

## 2022-07-17 DIAGNOSIS — D692 Other nonthrombocytopenic purpura: Secondary | ICD-10-CM | POA: Diagnosis not present

## 2022-07-17 DIAGNOSIS — Z8616 Personal history of COVID-19: Secondary | ICD-10-CM | POA: Diagnosis not present

## 2022-07-17 DIAGNOSIS — M1990 Unspecified osteoarthritis, unspecified site: Secondary | ICD-10-CM | POA: Diagnosis not present

## 2022-08-03 DIAGNOSIS — I509 Heart failure, unspecified: Secondary | ICD-10-CM | POA: Diagnosis not present

## 2022-08-03 DIAGNOSIS — B0223 Postherpetic polyneuropathy: Secondary | ICD-10-CM | POA: Diagnosis not present

## 2022-08-03 DIAGNOSIS — Z66 Do not resuscitate: Secondary | ICD-10-CM | POA: Diagnosis not present

## 2022-08-03 DIAGNOSIS — L8990 Pressure ulcer of unspecified site, unspecified stage: Secondary | ICD-10-CM | POA: Diagnosis not present

## 2022-08-03 DIAGNOSIS — R0602 Shortness of breath: Secondary | ICD-10-CM | POA: Diagnosis not present

## 2022-08-03 DIAGNOSIS — F330 Major depressive disorder, recurrent, mild: Secondary | ICD-10-CM | POA: Diagnosis not present

## 2022-08-03 DIAGNOSIS — I5031 Acute diastolic (congestive) heart failure: Secondary | ICD-10-CM | POA: Diagnosis not present

## 2022-08-03 DIAGNOSIS — Z87891 Personal history of nicotine dependence: Secondary | ICD-10-CM | POA: Diagnosis not present

## 2022-08-06 ENCOUNTER — Ambulatory Visit (HOSPITAL_COMMUNITY): Payer: HMO

## 2022-08-07 ENCOUNTER — Ambulatory Visit: Payer: PPO | Admitting: Pulmonary Disease

## 2022-08-21 ENCOUNTER — Ambulatory Visit (HOSPITAL_COMMUNITY)
Admission: RE | Admit: 2022-08-21 | Discharge: 2022-08-21 | Disposition: A | Discharge status: Home / Self Care | Payer: HMO | Source: Ambulatory Visit | Attending: Pulmonary Disease | Admitting: Pulmonary Disease

## 2022-08-21 DIAGNOSIS — J90 Pleural effusion, not elsewhere classified: Secondary | ICD-10-CM

## 2022-08-21 DIAGNOSIS — J849 Interstitial pulmonary disease, unspecified: Secondary | ICD-10-CM | POA: Diagnosis not present

## 2022-08-21 DIAGNOSIS — J479 Bronchiectasis, uncomplicated: Secondary | ICD-10-CM | POA: Diagnosis not present

## 2022-08-21 DIAGNOSIS — J8410 Pulmonary fibrosis, unspecified: Secondary | ICD-10-CM | POA: Diagnosis not present

## 2022-08-26 DIAGNOSIS — I5032 Chronic diastolic (congestive) heart failure: Secondary | ICD-10-CM | POA: Diagnosis not present

## 2022-08-26 DIAGNOSIS — N1831 Chronic kidney disease, stage 3a: Secondary | ICD-10-CM | POA: Diagnosis not present

## 2022-08-26 DIAGNOSIS — I25119 Atherosclerotic heart disease of native coronary artery with unspecified angina pectoris: Secondary | ICD-10-CM | POA: Diagnosis not present

## 2022-08-26 DIAGNOSIS — B029 Zoster without complications: Secondary | ICD-10-CM | POA: Diagnosis not present

## 2022-09-01 DIAGNOSIS — I700 Atherosclerosis of aorta: Secondary | ICD-10-CM | POA: Diagnosis not present

## 2022-09-01 DIAGNOSIS — G2581 Restless legs syndrome: Secondary | ICD-10-CM | POA: Diagnosis not present

## 2022-09-01 DIAGNOSIS — I25119 Atherosclerotic heart disease of native coronary artery with unspecified angina pectoris: Secondary | ICD-10-CM | POA: Diagnosis not present

## 2022-09-01 DIAGNOSIS — J069 Acute upper respiratory infection, unspecified: Secondary | ICD-10-CM | POA: Diagnosis not present

## 2022-09-02 ENCOUNTER — Ambulatory Visit: Payer: PPO | Admitting: Pulmonary Disease

## 2022-09-02 ENCOUNTER — Ambulatory Visit: Payer: PPO | Admitting: Cardiology

## 2022-09-02 DIAGNOSIS — I509 Heart failure, unspecified: Secondary | ICD-10-CM | POA: Diagnosis not present

## 2022-09-02 DIAGNOSIS — L8990 Pressure ulcer of unspecified site, unspecified stage: Secondary | ICD-10-CM | POA: Diagnosis not present

## 2022-09-02 DIAGNOSIS — R0602 Shortness of breath: Secondary | ICD-10-CM | POA: Diagnosis not present

## 2022-09-02 DIAGNOSIS — I5031 Acute diastolic (congestive) heart failure: Secondary | ICD-10-CM | POA: Diagnosis not present

## 2022-09-04 ENCOUNTER — Other Ambulatory Visit: Payer: Self-pay

## 2022-09-04 ENCOUNTER — Emergency Department (HOSPITAL_COMMUNITY): Payer: HMO

## 2022-09-04 ENCOUNTER — Inpatient Hospital Stay (HOSPITAL_COMMUNITY)
Admission: EM | Admit: 2022-09-04 | Discharge: 2022-09-10 | DRG: 291 | Disposition: A | Discharge status: Skilled Nursing Facility | Payer: HMO | Attending: Family Medicine | Admitting: Family Medicine

## 2022-09-04 ENCOUNTER — Encounter (HOSPITAL_COMMUNITY): Payer: Self-pay

## 2022-09-04 DIAGNOSIS — N1831 Chronic kidney disease, stage 3a: Secondary | ICD-10-CM | POA: Diagnosis present

## 2022-09-04 DIAGNOSIS — Z7982 Long term (current) use of aspirin: Secondary | ICD-10-CM

## 2022-09-04 DIAGNOSIS — Z794 Long term (current) use of insulin: Secondary | ICD-10-CM

## 2022-09-04 DIAGNOSIS — E871 Hypo-osmolality and hyponatremia: Secondary | ICD-10-CM | POA: Diagnosis not present

## 2022-09-04 DIAGNOSIS — R7989 Other specified abnormal findings of blood chemistry: Secondary | ICD-10-CM | POA: Insufficient documentation

## 2022-09-04 DIAGNOSIS — Z888 Allergy status to other drugs, medicaments and biological substances status: Secondary | ICD-10-CM

## 2022-09-04 DIAGNOSIS — Z955 Presence of coronary angioplasty implant and graft: Secondary | ICD-10-CM | POA: Diagnosis not present

## 2022-09-04 DIAGNOSIS — I2510 Atherosclerotic heart disease of native coronary artery without angina pectoris: Secondary | ICD-10-CM | POA: Diagnosis present

## 2022-09-04 DIAGNOSIS — Z833 Family history of diabetes mellitus: Secondary | ICD-10-CM | POA: Diagnosis not present

## 2022-09-04 DIAGNOSIS — Z6827 Body mass index (BMI) 27.0-27.9, adult: Secondary | ICD-10-CM | POA: Diagnosis not present

## 2022-09-04 DIAGNOSIS — E1122 Type 2 diabetes mellitus with diabetic chronic kidney disease: Secondary | ICD-10-CM | POA: Diagnosis not present

## 2022-09-04 DIAGNOSIS — R059 Cough, unspecified: Secondary | ICD-10-CM | POA: Diagnosis not present

## 2022-09-04 DIAGNOSIS — J189 Pneumonia, unspecified organism: Secondary | ICD-10-CM | POA: Diagnosis not present

## 2022-09-04 DIAGNOSIS — I5033 Acute on chronic diastolic (congestive) heart failure: Secondary | ICD-10-CM | POA: Diagnosis not present

## 2022-09-04 DIAGNOSIS — K219 Gastro-esophageal reflux disease without esophagitis: Secondary | ICD-10-CM | POA: Diagnosis present

## 2022-09-04 DIAGNOSIS — I080 Rheumatic disorders of both mitral and aortic valves: Secondary | ICD-10-CM | POA: Diagnosis not present

## 2022-09-04 DIAGNOSIS — I252 Old myocardial infarction: Secondary | ICD-10-CM

## 2022-09-04 DIAGNOSIS — N1832 Chronic kidney disease, stage 3b: Secondary | ICD-10-CM | POA: Diagnosis not present

## 2022-09-04 DIAGNOSIS — R9431 Abnormal electrocardiogram [ECG] [EKG]: Secondary | ICD-10-CM | POA: Insufficient documentation

## 2022-09-04 DIAGNOSIS — J8410 Pulmonary fibrosis, unspecified: Secondary | ICD-10-CM | POA: Diagnosis present

## 2022-09-04 DIAGNOSIS — G8929 Other chronic pain: Secondary | ICD-10-CM | POA: Diagnosis present

## 2022-09-04 DIAGNOSIS — E782 Mixed hyperlipidemia: Secondary | ICD-10-CM | POA: Diagnosis not present

## 2022-09-04 DIAGNOSIS — E11641 Type 2 diabetes mellitus with hypoglycemia with coma: Secondary | ICD-10-CM | POA: Diagnosis not present

## 2022-09-04 DIAGNOSIS — I10 Essential (primary) hypertension: Secondary | ICD-10-CM | POA: Diagnosis not present

## 2022-09-04 DIAGNOSIS — E8809 Other disorders of plasma-protein metabolism, not elsewhere classified: Secondary | ICD-10-CM | POA: Insufficient documentation

## 2022-09-04 DIAGNOSIS — D631 Anemia in chronic kidney disease: Secondary | ICD-10-CM | POA: Diagnosis not present

## 2022-09-04 DIAGNOSIS — C884 Extranodal marginal zone b-cell lymphoma of mucosa-associated lymphoid tissue (malt-lymphoma) not having achieved remission: Secondary | ICD-10-CM | POA: Diagnosis present

## 2022-09-04 DIAGNOSIS — Z1152 Encounter for screening for COVID-19: Secondary | ICD-10-CM

## 2022-09-04 DIAGNOSIS — I5020 Unspecified systolic (congestive) heart failure: Principal | ICD-10-CM

## 2022-09-04 DIAGNOSIS — Z66 Do not resuscitate: Secondary | ICD-10-CM | POA: Diagnosis not present

## 2022-09-04 DIAGNOSIS — I480 Paroxysmal atrial fibrillation: Secondary | ICD-10-CM | POA: Diagnosis present

## 2022-09-04 DIAGNOSIS — M6281 Muscle weakness (generalized): Secondary | ICD-10-CM | POA: Diagnosis not present

## 2022-09-04 DIAGNOSIS — R41841 Cognitive communication deficit: Secondary | ICD-10-CM | POA: Diagnosis not present

## 2022-09-04 DIAGNOSIS — R531 Weakness: Secondary | ICD-10-CM | POA: Diagnosis not present

## 2022-09-04 DIAGNOSIS — E11649 Type 2 diabetes mellitus with hypoglycemia without coma: Secondary | ICD-10-CM | POA: Diagnosis not present

## 2022-09-04 DIAGNOSIS — R0981 Nasal congestion: Secondary | ICD-10-CM | POA: Diagnosis not present

## 2022-09-04 DIAGNOSIS — E440 Moderate protein-calorie malnutrition: Secondary | ICD-10-CM | POA: Diagnosis present

## 2022-09-04 DIAGNOSIS — R0602 Shortness of breath: Secondary | ICD-10-CM | POA: Diagnosis not present

## 2022-09-04 DIAGNOSIS — I509 Heart failure, unspecified: Secondary | ICD-10-CM | POA: Diagnosis not present

## 2022-09-04 DIAGNOSIS — Z79899 Other long term (current) drug therapy: Secondary | ICD-10-CM

## 2022-09-04 DIAGNOSIS — Z886 Allergy status to analgesic agent status: Secondary | ICD-10-CM

## 2022-09-04 DIAGNOSIS — Z87891 Personal history of nicotine dependence: Secondary | ICD-10-CM

## 2022-09-04 DIAGNOSIS — E46 Unspecified protein-calorie malnutrition: Secondary | ICD-10-CM | POA: Insufficient documentation

## 2022-09-04 DIAGNOSIS — R609 Edema, unspecified: Secondary | ICD-10-CM | POA: Diagnosis not present

## 2022-09-04 DIAGNOSIS — Z8679 Personal history of other diseases of the circulatory system: Secondary | ICD-10-CM

## 2022-09-04 DIAGNOSIS — R1310 Dysphagia, unspecified: Secondary | ICD-10-CM | POA: Diagnosis not present

## 2022-09-04 DIAGNOSIS — I5031 Acute diastolic (congestive) heart failure: Secondary | ICD-10-CM | POA: Diagnosis not present

## 2022-09-04 DIAGNOSIS — R4182 Altered mental status, unspecified: Secondary | ICD-10-CM | POA: Diagnosis not present

## 2022-09-04 DIAGNOSIS — J45909 Unspecified asthma, uncomplicated: Secondary | ICD-10-CM | POA: Diagnosis not present

## 2022-09-04 DIAGNOSIS — Z8711 Personal history of peptic ulcer disease: Secondary | ICD-10-CM

## 2022-09-04 DIAGNOSIS — Z7984 Long term (current) use of oral hypoglycemic drugs: Secondary | ICD-10-CM

## 2022-09-04 DIAGNOSIS — I130 Hypertensive heart and chronic kidney disease with heart failure and stage 1 through stage 4 chronic kidney disease, or unspecified chronic kidney disease: Secondary | ICD-10-CM | POA: Diagnosis not present

## 2022-09-04 DIAGNOSIS — Z96652 Presence of left artificial knee joint: Secondary | ICD-10-CM | POA: Diagnosis present

## 2022-09-04 DIAGNOSIS — J811 Chronic pulmonary edema: Secondary | ICD-10-CM | POA: Diagnosis not present

## 2022-09-04 DIAGNOSIS — M1990 Unspecified osteoarthritis, unspecified site: Secondary | ICD-10-CM | POA: Diagnosis present

## 2022-09-04 DIAGNOSIS — F419 Anxiety disorder, unspecified: Secondary | ICD-10-CM | POA: Diagnosis present

## 2022-09-04 DIAGNOSIS — I110 Hypertensive heart disease with heart failure: Secondary | ICD-10-CM | POA: Diagnosis not present

## 2022-09-04 DIAGNOSIS — R2689 Other abnormalities of gait and mobility: Secondary | ICD-10-CM | POA: Diagnosis not present

## 2022-09-04 DIAGNOSIS — R5381 Other malaise: Secondary | ICD-10-CM | POA: Diagnosis not present

## 2022-09-04 DIAGNOSIS — Z87442 Personal history of urinary calculi: Secondary | ICD-10-CM

## 2022-09-04 LAB — CBG MONITORING, ED
Glucose-Capillary: 40 mg/dL — CL (ref 70–99)
Glucose-Capillary: 166 mg/dL — ABNORMAL HIGH (ref 70–99)

## 2022-09-04 LAB — CBC WITH DIFFERENTIAL/PLATELET
Abs Immature Granulocytes: 0.02 10*3/uL (ref 0.00–0.07)
Basophils Absolute: 0 10*3/uL (ref 0.0–0.1)
Basophils Relative: 0 %
Eosinophils Absolute: 0 10*3/uL (ref 0.0–0.5)
Eosinophils Relative: 1 %
HCT: 32.2 % — ABNORMAL LOW (ref 39.0–52.0)
Hemoglobin: 10.7 g/dL — ABNORMAL LOW (ref 13.0–17.0)
Immature Granulocytes: 0 %
Lymphocytes Relative: 32 %
Lymphs Abs: 1.7 10*3/uL (ref 0.7–4.0)
MCH: 29 pg (ref 26.0–34.0)
MCHC: 33.2 g/dL (ref 30.0–36.0)
MCV: 87.3 fL (ref 80.0–100.0)
Monocytes Absolute: 0.4 10*3/uL (ref 0.1–1.0)
Monocytes Relative: 7 %
Neutro Abs: 3 10*3/uL (ref 1.7–7.7)
Neutrophils Relative %: 60 %
Platelets: ADEQUATE 10*3/uL (ref 150–400)
RBC: 3.69 MIL/uL — ABNORMAL LOW (ref 4.22–5.81)
RDW: 16.2 % — ABNORMAL HIGH (ref 11.5–15.5)
Smear Review: ADEQUATE
WBC: 5.1 10*3/uL (ref 4.0–10.5)
nRBC: 0 % (ref 0.0–0.2)

## 2022-09-04 LAB — COMPREHENSIVE METABOLIC PANEL
ALT: 27 U/L (ref 0–44)
AST: 36 U/L (ref 15–41)
Albumin: 2.5 g/dL — ABNORMAL LOW (ref 3.5–5.0)
Alkaline Phosphatase: 83 U/L (ref 38–126)
Anion gap: 7 (ref 5–15)
BUN: 42 mg/dL — ABNORMAL HIGH (ref 8–23)
CO2: 25 mmol/L (ref 22–32)
Calcium: 8 mg/dL — ABNORMAL LOW (ref 8.9–10.3)
Chloride: 97 mmol/L — ABNORMAL LOW (ref 98–111)
Creatinine, Ser: 1.35 mg/dL — ABNORMAL HIGH (ref 0.61–1.24)
GFR, Estimated: 52 mL/min — ABNORMAL LOW (ref >60)
Glucose, Bld: 79 mg/dL (ref 70–99)
Potassium: 3.9 mmol/L (ref 3.5–5.1)
Sodium: 129 mmol/L — ABNORMAL LOW (ref 135–145)
Total Bilirubin: 0.5 mg/dL (ref 0.3–1.2)
Total Protein: 7.4 g/dL (ref 6.5–8.1)

## 2022-09-04 LAB — URINALYSIS, ROUTINE W REFLEX MICROSCOPIC
Bilirubin Urine: NEGATIVE
Glucose, UA: NEGATIVE mg/dL
Hgb urine dipstick: NEGATIVE
Ketones, ur: NEGATIVE mg/dL
Leukocytes,Ua: NEGATIVE
Nitrite: NEGATIVE
Protein, ur: NEGATIVE mg/dL
Specific Gravity, Urine: 1.006 (ref 1.005–1.030)
pH: 5 (ref 5.0–8.0)

## 2022-09-04 LAB — RESP PANEL BY RT-PCR (RSV, FLU A&B, COVID)  RVPGX2
Influenza A by PCR: NEGATIVE
Influenza B by PCR: NEGATIVE
Resp Syncytial Virus by PCR: NEGATIVE
SARS Coronavirus 2 by RT PCR: NEGATIVE

## 2022-09-04 LAB — BRAIN NATRIURETIC PEPTIDE: B Natriuretic Peptide: 380 pg/mL — ABNORMAL HIGH (ref 0.0–100.0)

## 2022-09-04 MED ORDER — GUAIFENESIN-DM 100-10 MG/5ML PO SYRP: 5.0000 mL | ORAL_SOLUTION | ORAL | Status: DC | PRN

## 2022-09-04 MED ORDER — SODIUM CHLORIDE 0.9 % IV SOLN
1.0000 g | INTRAVENOUS | Status: DC
  Administered 2022-09-04 – 2022-09-09 (×6): 1 g via INTRAVENOUS
  Filled 2022-09-04 (×6): qty 10

## 2022-09-04 MED ORDER — FUROSEMIDE 10 MG/ML IJ SOLN
20.0000 mg | Freq: Once | INTRAMUSCULAR | Status: AC
  Administered 2022-09-04: 20 mg via INTRAVENOUS
  Filled 2022-09-04: qty 2

## 2022-09-04 MED ORDER — ENOXAPARIN SODIUM 40 MG/0.4ML IJ SOSY
40.0000 mg | PREFILLED_SYRINGE | INTRAMUSCULAR | Status: DC
  Administered 2022-09-05 – 2022-09-10 (×6): 40 mg via SUBCUTANEOUS
  Filled 2022-09-04 (×5): qty 0.4

## 2022-09-04 MED ORDER — METOPROLOL TARTRATE 5 MG/5ML IV SOLN
2.5000 mg | Freq: Once | INTRAVENOUS | Status: AC
  Administered 2022-09-04: 2.5 mg via INTRAVENOUS
  Filled 2022-09-04: qty 5

## 2022-09-04 MED ORDER — ACETAMINOPHEN 650 MG RE SUPP: 650.0000 mg | Freq: Four times a day (QID) | RECTAL | Status: DC | PRN

## 2022-09-04 MED ORDER — ONDANSETRON HCL 4 MG/2ML IJ SOLN: 4.0000 mg | Freq: Four times a day (QID) | INTRAMUSCULAR | Status: DC | PRN

## 2022-09-04 MED ORDER — ONDANSETRON HCL 4 MG PO TABS: 4.0000 mg | ORAL_TABLET | Freq: Four times a day (QID) | ORAL | Status: DC | PRN

## 2022-09-04 MED ORDER — DEXTROSE 50 % IV SOLN
INTRAVENOUS | Status: AC
  Filled 2022-09-04: qty 50

## 2022-09-04 MED ORDER — ACETAMINOPHEN 325 MG PO TABS
650.0000 mg | ORAL_TABLET | Freq: Four times a day (QID) | ORAL | Status: DC | PRN
  Administered 2022-09-04 – 2022-09-08 (×3): 650 mg via ORAL
  Filled 2022-09-04 (×3): qty 2

## 2022-09-04 MED ORDER — SODIUM CHLORIDE 0.9 % IV SOLN
500.0000 mg | INTRAVENOUS | Status: DC
  Administered 2022-09-04 – 2022-09-09 (×6): 500 mg via INTRAVENOUS
  Filled 2022-09-04 (×6): qty 5

## 2022-09-04 MED ORDER — DEXTROSE 50 % IV SOLN
50.0000 mL | Freq: Once | INTRAVENOUS | Status: AC
  Administered 2022-09-04: 50 mL via INTRAVENOUS

## 2022-09-04 NOTE — ED Provider Notes (Signed)
[place] EMERGENCY DEPARTMENT AT [facility] Provider Note   CSN: [id number] Arrival date & time: 09/04/22  1529     History  Chief Complaint  Patient presents with   Urinary odor   Cough    Bradley Sheppard is a 82 y.o. male.  Patient complains of a cough and fatigue for few days.  Patient has a history of atrial fibs  The history is provided by the patient and medical records. No language interpreter was used.  Cough Cough characteristics:  Non-productive Sputum characteristics:  Nondescript Severity:  Moderate Onset quality:  Sudden Timing:  Constant Progression:  Waxing and waning Chronicity:  New Smoker: no   Associated symptoms: no chest pain, no eye discharge, no headaches and no rash        Home Medications Prior to Admission medications   Medication Sig Start Date End Date Taking? Authorizing Provider  acetaminophen (TYLENOL) 500 MG tablet Take 1,000 mg by mouth every 6 (six) hours as needed for moderate pain.    [provider]  aspirin EC 81 MG tablet Take 1 tablet (81 mg total) by mouth daily with breakfast. Swallow whole. 01/12/22   Shon Hale, MD  Calcium Polycarbophil (FIBER-LAX PO) Take 1 capsule by mouth daily.    [provider]  carvedilol (COREG) 12.5 MG tablet Take 1 tablet (12.5 mg total) by mouth 2 (two) times daily with a meal. 01/12/22   Emokpae, Courage, MD  feeding supplement (ENSURE SURGERY) LIQD Take 237 mLs by mouth 2 (two) times daily between meals. 02/04/22   [name], MD  furosemide (LASIX) 40 MG tablet Take 1 tablet (40 mg total) by mouth daily. 12/02/21   [name], MD  glipiZIDE (GLUCOTROL XL) 10 MG 24 hr tablet Take 1 tablet (10 mg total) by mouth daily with breakfast. 02/04/22   [name], MD  insulin glargine (LANTUS) 100 UNIT/ML injection Inject 10 Units into the skin at bedtime.    [provider]  LORazepam (ATIVAN) 0.5 MG tablet Take 1 tablet (0.5 mg total) by mouth at bedtime.  02/04/22   [name], MD  metolazone (ZAROXOLYN) 5 MG tablet Take 5 mg by mouth 2 (two) times a week. 05/20/22   [provider]  nitroGLYCERIN (NITROSTAT) 0.4 MG SL tablet Place 1 tablet (0.4 mg total) under the tongue every 5 (five) minutes as needed. 06/30/21   [name], MD  oxyCODONE (OXY IR/ROXICODONE) 5 MG immediate release tablet Take 0.5-1 tablets (2.5-5 mg total) by mouth every 6 (six) hours as needed for moderate pain, severe pain or breakthrough pain. 02/03/22   [name], MD  polyethylene glycol (MIRALAX / GLYCOLAX) 17 g packet Take 17 g by mouth 2 (two) times daily as needed for mild constipation. 02/04/22   [name], MD  rosuvastatin (CRESTOR) 5 MG tablet Take 5 mg by mouth every Wednesday. 12/02/21   [provider]  tamsulosin (FLOMAX) 0.4 MG CAPS capsule Take 1 capsule (0.4 mg total) by mouth daily. 04/20/22   Jerilee Field, MD  vitamin B-12 (CYANOCOBALAMIN) 500 MCG tablet Take 500 mcg by mouth daily.    [provider]  vitamin C (ASCORBIC ACID) 500 MG tablet Take 500 mg by mouth at bedtime.    [provider]  witch hazel-glycerin (TUCKS) pad Apply topically as needed for irritation or hemorrhoids. 02/04/22   [name], MD  Zinc Oxide 40 % PSTE Apply 1 Application topically as needed. Apply  to buttock and sacrum if silicone foam is not effective due to incontinence 01/12/22   Shon Hale, MD      Allergies    Nsaids and Prednisone    Review of Systems   Review of Systems  Constitutional:  Negative for appetite change and fatigue.  HENT:  Negative for congestion, ear discharge and sinus pressure.   Eyes:  Negative for discharge.  Respiratory:  Positive for cough.   Cardiovascular:  Negative for chest pain.  Gastrointestinal:  Negative for abdominal pain and diarrhea.  Genitourinary:  Negative for frequency and hematuria.  Musculoskeletal:  Negative for back pain.  Skin:  Negative for rash.  Neurological:   Negative for seizures and headaches.  Psychiatric/Behavioral:  Negative for hallucinations.     Physical Exam Updated Vital Signs BP 128/84   Pulse (!) 116   Temp 98 F (36.7 C) (Oral)   Resp 20   Ht 6' (1.829 m)   Wt 93 kg   SpO2 93%   BMI 27.81 kg/m  Physical Exam Vitals and nursing note reviewed.  Constitutional:      Appearance: He is well-developed.  HENT:     Head: Normocephalic.     Nose: Nose normal.  Eyes:     General: No scleral icterus.    Conjunctiva/sclera: Conjunctivae normal.  Neck:     Thyroid: No thyromegaly.  Cardiovascular:     Rate and Rhythm: Tachycardia present. Rhythm irregular.     Heart sounds: No murmur heard.    No friction rub. No gallop.  Pulmonary:     Breath sounds: No stridor. No wheezing or rales.  Chest:     Chest wall: No tenderness.  Abdominal:     General: There is no distension.     Tenderness: There is no abdominal tenderness. There is no rebound.  Musculoskeletal:        General: Normal range of motion.     Cervical back: Neck supple.  Lymphadenopathy:     Cervical: No cervical adenopathy.  Skin:    Findings: No erythema or rash.  Neurological:     Mental Status: He is alert and oriented to person, place, and time.     Motor: No abnormal muscle tone.     Coordination: Coordination normal.  Psychiatric:        Behavior: Behavior normal.     ED Results / Procedures / Treatments   Labs (all labs ordered are listed, but only abnormal results are displayed) Labs Reviewed  CBC WITH DIFFERENTIAL/PLATELET - Abnormal; Notable for the following components:      Result Value   RBC 3.69 (*)    Hemoglobin 10.7 (*)    HCT 32.2 (*)    RDW 16.2 (*)    All other components within normal limits  COMPREHENSIVE METABOLIC PANEL - Abnormal; Notable for the following components:   Sodium 129 (*)    Chloride 97 (*)    BUN 42 (*)    Creatinine, Ser 1.35 (*)    Calcium 8.0 (*)    Albumin 2.5 (*)    GFR, Estimated 52 (*)    All  other components within normal limits  BRAIN NATRIURETIC PEPTIDE - Abnormal; Notable for the following components:   B Natriuretic Peptide 380.0 (*)    All other components within normal limits  URINALYSIS, ROUTINE W REFLEX MICROSCOPIC - Abnormal; Notable for the following components:   Color, Urine STRAW (*)    All other components within normal limits  CBG MONITORING,  ED - Abnormal; Notable for the following components:   Glucose-Capillary 40 (*)    All other components within normal limits  CBG MONITORING, ED - Abnormal; Notable for the following components:   Glucose-Capillary 166 (*)    All other components within normal limits  RESP PANEL BY RT-PCR (RSV, FLU A&B, COVID)  [id number]    EKG EKG Interpretation  Date/Time:  Friday Sep 04 2022 15:58:03 EDT Ventricular Rate:  111 PR Interval:    QRS Duration: 118 QT Interval:  378 QTC Calculation: 514 R Axis:   -80 Text Interpretation: Atrial fibrillation Left anterior fascicular block Anterior infarct, old Confirmed by Bethann Berkshire [phone]) on 09/04/2022 9:07:24 PM  Radiology DG Chest Port 1 View  Result Date: 09/04/2022 CLINICAL DATA:  Shortness of breath EXAM: PORTABLE CHEST 1 VIEW COMPARISON:  CXR 01/11/22 FINDINGS: Cardiomegaly. No pleural effusion. No pneumothorax. There are prominent bilateral interstitial opacities which likely represent a combination of patient's known fibrotic lung disease and mild pulmonary edema. There may be a focal retrocardiac opacity, which could represent infection. No radiographically apparent displaced rib fractures. Visualized upper abdomen is unremarkable. Vertebral body heights are maintained. IMPRESSION: 1. Cardiomegaly with mild pulmonary edema. 2. Findings of fibrotic lung disease with a more focal retrocardiac opacity, which could represent superimposed infection. Electronically Signed   By: Lorenza Cambridge M.D.   On: 09/04/2022 16:53    Procedures Procedures    Medications Ordered in  ED Medications  furosemide (LASIX) injection 20 mg (20 mg Intravenous Given 09/04/22 1719)  dextrose 50 % solution 50 mL (50 mLs Intravenous Given 09/04/22 1947)  metoprolol tartrate (LOPRESSOR) injection 2.5 mg (2.5 mg Intravenous Given 09/04/22 2201)    ED Course/ Medical Decision Making/ A&P                             Medical Decision Making Amount and/or Complexity of Data Reviewed Labs: ordered. Radiology: ordered. ECG/medicine tests: ordered.  Risk Prescription drug management. Decision regarding hospitalization.   This patient presents to the ED for concern of cough and weakness, this involves an extensive number of treatment options, and is a complaint that carries with it a high risk of complications and morbidity.  The differential diagnosis includes pneumonia, heart failure   Co morbidities that complicate the patient evaluation  Atrial fibs   Additional history obtained:  Additional history obtained from patient External records from outside source obtained and reviewed including hospital records   Lab Tests:  I Ordered, and personally interpreted labs.  The pertinent results include: BNP 380   Imaging Studies ordered:  I ordered imaging studies including chest x-ray I independently visualized and interpreted imaging which showed vascular congestion I agree with the radiologist interpretation   Cardiac Monitoring: / EKG:  The patient was maintained on a cardiac monitor.  I personally viewed and interpreted the cardiac monitored which showed an underlying rhythm of: Atrial fibs   Consultations Obtained:  I requested consultation with the hospitalist,  and discussed lab and imaging findings as well as pertinent plan - they recommend: Admit   Problem List / ED Course / Critical interventions / Medication management  Congestive heart failure and atrial fibs I ordered medication including Lopressor for atrial fibs Reevaluation of the patient after these  medicines showed that the patient improved I have reviewed the patients home medicines and have made adjustments as needed   Social Determinants of Health:  None   Test /  Admission - Considered:  None   Patient with mild congestive heart failure and poorly controlled atrial fibs.  He will be admitted to medicine to control his atrial rate and for some diuresis for the heart failure        Final Clinical Impression(s) / ED Diagnoses Final diagnoses:  Systolic congestive heart failure, unspecified HF chronicity (HCC)    Rx / DC Orders ED Discharge Orders     None         Bethann Berkshire, MD 09/05/22 1306

## 2022-09-04 NOTE — ED Triage Notes (Signed)
Pt bib [facility] from home with c/o cough, congestion and foul smelling urine. Pt breathing is WNL, vitals WNL per EMS, pt says he had virtual visit with his Dr today and was instructed to come to the ED to be evaluated for possible PNA and UTI.

## 2022-09-04 NOTE — ED Notes (Signed)
Patient and his wife were given something to eat and drink.

## 2022-09-04 NOTE — ED Notes (Signed)
Pt attempted to urinate and was unable to do so at this time.

## 2022-09-05 ENCOUNTER — Other Ambulatory Visit (HOSPITAL_COMMUNITY): Payer: HMO

## 2022-09-05 ENCOUNTER — Inpatient Hospital Stay (HOSPITAL_COMMUNITY): Payer: HMO

## 2022-09-05 DIAGNOSIS — I5031 Acute diastolic (congestive) heart failure: Secondary | ICD-10-CM | POA: Diagnosis not present

## 2022-09-05 DIAGNOSIS — I480 Paroxysmal atrial fibrillation: Secondary | ICD-10-CM | POA: Diagnosis not present

## 2022-09-05 DIAGNOSIS — E11641 Type 2 diabetes mellitus with hypoglycemia with coma: Secondary | ICD-10-CM

## 2022-09-05 DIAGNOSIS — I10 Essential (primary) hypertension: Secondary | ICD-10-CM

## 2022-09-05 DIAGNOSIS — J189 Pneumonia, unspecified organism: Secondary | ICD-10-CM

## 2022-09-05 DIAGNOSIS — I5033 Acute on chronic diastolic (congestive) heart failure: Secondary | ICD-10-CM | POA: Diagnosis not present

## 2022-09-05 DIAGNOSIS — E8809 Other disorders of plasma-protein metabolism, not elsewhere classified: Secondary | ICD-10-CM

## 2022-09-05 DIAGNOSIS — E871 Hypo-osmolality and hyponatremia: Secondary | ICD-10-CM

## 2022-09-05 DIAGNOSIS — R7989 Other specified abnormal findings of blood chemistry: Secondary | ICD-10-CM

## 2022-09-05 DIAGNOSIS — C884 Extranodal marginal zone B-cell lymphoma of mucosa-associated lymphoid tissue [MALT-lymphoma]: Secondary | ICD-10-CM

## 2022-09-05 DIAGNOSIS — R9431 Abnormal electrocardiogram [ECG] [EKG]: Secondary | ICD-10-CM | POA: Insufficient documentation

## 2022-09-05 DIAGNOSIS — Z8679 Personal history of other diseases of the circulatory system: Secondary | ICD-10-CM

## 2022-09-05 DIAGNOSIS — E46 Unspecified protein-calorie malnutrition: Secondary | ICD-10-CM

## 2022-09-05 LAB — CBC
HCT: 30.7 % — ABNORMAL LOW (ref 39.0–52.0)
Hemoglobin: 10.3 g/dL — ABNORMAL LOW (ref 13.0–17.0)
MCH: 29.3 pg (ref 26.0–34.0)
MCHC: 33.6 g/dL (ref 30.0–36.0)
MCV: 87.2 fL (ref 80.0–100.0)
Platelets: 174 10*3/uL (ref 150–400)
RBC: 3.52 MIL/uL — ABNORMAL LOW (ref 4.22–5.81)
RDW: 16.2 % — ABNORMAL HIGH (ref 11.5–15.5)
WBC: 5 10*3/uL (ref 4.0–10.5)
nRBC: 0 % (ref 0.0–0.2)

## 2022-09-05 LAB — GLUCOSE, CAPILLARY
Glucose-Capillary: 55 mg/dL — ABNORMAL LOW (ref 70–99)
Glucose-Capillary: 121 mg/dL — ABNORMAL HIGH (ref 70–99)
Glucose-Capillary: 57 mg/dL — ABNORMAL LOW (ref 70–99)
Glucose-Capillary: 132 mg/dL — ABNORMAL HIGH (ref 70–99)
Glucose-Capillary: 186 mg/dL — ABNORMAL HIGH (ref 70–99)
Glucose-Capillary: 154 mg/dL — ABNORMAL HIGH (ref 70–99)
Glucose-Capillary: 165 mg/dL — ABNORMAL HIGH (ref 70–99)

## 2022-09-05 LAB — COMPREHENSIVE METABOLIC PANEL
ALT: 26 U/L (ref 0–44)
AST: 35 U/L (ref 15–41)
Albumin: 2.4 g/dL — ABNORMAL LOW (ref 3.5–5.0)
Alkaline Phosphatase: 84 U/L (ref 38–126)
Anion gap: 7 (ref 5–15)
BUN: 42 mg/dL — ABNORMAL HIGH (ref 8–23)
CO2: 26 mmol/L (ref 22–32)
Calcium: 7.9 mg/dL — ABNORMAL LOW (ref 8.9–10.3)
Chloride: 98 mmol/L (ref 98–111)
Creatinine, Ser: 1.43 mg/dL — ABNORMAL HIGH (ref 0.61–1.24)
GFR, Estimated: 48 mL/min — ABNORMAL LOW (ref >60)
Glucose, Bld: 54 mg/dL — ABNORMAL LOW (ref 70–99)
Potassium: 3.5 mmol/L (ref 3.5–5.1)
Sodium: 131 mmol/L — ABNORMAL LOW (ref 135–145)
Total Bilirubin: 0.6 mg/dL (ref 0.3–1.2)
Total Protein: 7.1 g/dL (ref 6.5–8.1)

## 2022-09-05 LAB — STREP PNEUMONIAE URINARY ANTIGEN: Strep Pneumo Urinary Antigen: NEGATIVE

## 2022-09-05 LAB — CULTURE, BLOOD (ROUTINE X 2)

## 2022-09-05 LAB — MAGNESIUM: Magnesium: 1.8 mg/dL (ref 1.7–2.4)

## 2022-09-05 LAB — ECHOCARDIOGRAM COMPLETE: Weight: 3245.17 oz

## 2022-09-05 LAB — PHOSPHORUS: Phosphorus: 3.5 mg/dL (ref 2.5–4.6)

## 2022-09-05 LAB — PROCALCITONIN: Procalcitonin: 0.36 ng/mL

## 2022-09-05 MED ORDER — ENSURE SURGERY PO LIQD
237.0000 mL | Freq: Two times a day (BID) | ORAL | Status: DC
  Administered 2022-09-08 – 2022-09-09 (×4): 237 mL via ORAL

## 2022-09-05 MED ORDER — OXYCODONE HCL 5 MG PO TABS
2.5000 mg | ORAL_TABLET | Freq: Four times a day (QID) | ORAL | Status: DC | PRN
  Administered 2022-09-05: 2.5 mg via ORAL
  Administered 2022-09-06: 5 mg via ORAL
  Administered 2022-09-06: 2.5 mg via ORAL
  Administered 2022-09-07 – 2022-09-10 (×5): 5 mg via ORAL
  Filled 2022-09-05 (×8): qty 1

## 2022-09-05 MED ORDER — PROCHLORPERAZINE EDISYLATE 10 MG/2ML IJ SOLN: 10.0000 mg | Freq: Four times a day (QID) | INTRAMUSCULAR | Status: DC | PRN

## 2022-09-05 MED ORDER — TAMSULOSIN HCL 0.4 MG PO CAPS
0.4000 mg | ORAL_CAPSULE | Freq: Every day | ORAL | Status: DC
  Administered 2022-09-05 – 2022-09-10 (×6): 0.4 mg via ORAL
  Filled 2022-09-05 (×6): qty 1

## 2022-09-05 MED ORDER — ROSUVASTATIN CALCIUM 10 MG PO TABS
5.0000 mg | ORAL_TABLET | ORAL | Status: DC
  Administered 2022-09-09: 5 mg via ORAL
  Filled 2022-09-05 (×2): qty 1

## 2022-09-05 MED ORDER — GLUCERNA SHAKE PO LIQD
237.0000 mL | Freq: Three times a day (TID) | ORAL | Status: DC
  Administered 2022-09-05 – 2022-09-09 (×14): 237 mL via ORAL

## 2022-09-05 MED ORDER — MUSCLE RUB 10-15 % EX CREA
TOPICAL_CREAM | CUTANEOUS | Status: DC | PRN
  Administered 2022-09-06: 1 via TOPICAL
  Filled 2022-09-05: qty 85

## 2022-09-05 MED ORDER — CARVEDILOL 12.5 MG PO TABS
12.5000 mg | ORAL_TABLET | Freq: Two times a day (BID) | ORAL | Status: DC
  Administered 2022-09-05 – 2022-09-10 (×11): 12.5 mg via ORAL
  Filled 2022-09-05 (×11): qty 1

## 2022-09-05 MED ORDER — ASPIRIN 81 MG PO TBEC
81.0000 mg | DELAYED_RELEASE_TABLET | Freq: Every day | ORAL | Status: DC
  Administered 2022-09-06 – 2022-09-10 (×5): 81 mg via ORAL
  Filled 2022-09-05 (×5): qty 1

## 2022-09-05 MED ORDER — FUROSEMIDE 10 MG/ML IJ SOLN
40.0000 mg | Freq: Two times a day (BID) | INTRAMUSCULAR | Status: DC
  Administered 2022-09-05 – 2022-09-08 (×8): 40 mg via INTRAVENOUS
  Filled 2022-09-05 (×8): qty 4

## 2022-09-05 MED ORDER — FUROSEMIDE 10 MG/ML IJ SOLN: 20.0000 mg | Freq: Two times a day (BID) | INTRAMUSCULAR | Status: DC

## 2022-09-05 NOTE — H&P (Signed)
History and Physical    Patient: Bradley Sheppard WUJ:[id number] DOB: 04/13/1941 DOA: 09/04/2022 DOS: the patient was seen and examined on 09/05/2022 PCP: [name], MD  Patient coming from: Home  Chief Complaint:  Chief Complaint  Patient presents with   Urinary odor   Cough   HPI: Bradley Sheppard is an 82 y.o. male with medical history significant of type 2 diabetes mellitus, hypertension, CKD 3A, diastolic CHF, rectal mass, MALT lymphoma who presents to the emergency department from home via EMS due to several days of onset of cough, chest congestion and foul-smelling urine.  He had a virtual visit with his PCP who started him on antibiotics (cefuroxime) without much relief of symptoms.  Per wife at bedside, patient has become more fatigued and weaker and has not been eating within the last 2 to 3 days, blood glucose at home was also said to be in the low range.  ED Course:  In the emergency department, patient was hemodynamically stable with vital signs being within normal range.  Workup in the ED showed normocytic anemia.  BMP was normal except for sodium of 129, chloride 97, BUN/creatinine 42/1.35 (creatinine improved from baseline range of 1.7-1.9), albumin 2.5, BNP 380 (this was 545.6 on 03/02/2022), urinalysis was normal.  Influenza A, B, SARS coronavirus 2, RSV was negative. Chest x-ray showed cardiomegaly with mild pulmonary edema and show findings of fibrotic lung disease with a more focal retrocardiac opacity, which could represent superimposed infection. Patient was treated with IV Lasix 20 mg x 1.  Dextrose 50% was given due to hypoglycemia.  Hospitalist was asked to admit patient for further evaluation and management.  Review of Systems: Review of systems as noted in the HPI. All other systems reviewed and are negative.   Past Medical History:  Diagnosis Date   (HFpEF) heart failure with preserved ejection fraction (HCC)    Anxiety    Aortic stenosis    Arthritis     Asthma    Chronic pain    CKD (chronic kidney disease) stage 3, GFR 30-59 ml/min (HCC)    Coronary atherosclerosis of native coronary artery    Multvessel, DES to LAD and RCA 8/03; EF 65% by echo 11/2014   DM2 (diabetes mellitus, type 2) (HCC)    Essential hypertension    Gastric ulcer    Gastritis    Related to NSAIDs   GERD (gastroesophageal reflux disease)    Iron deficiency anemia    Mitral valve regurgitation    Mixed hyperlipidemia    Myocardial infarction [facility])    Nephrolithiasis    Past Surgical History:  Procedure Laterality Date   APPENDECTOMY     BIOPSY  09/25/2021   Procedure: BIOPSY;  Surgeon: Corbin Ade, MD;  Location: [facility] SUITE;  Service: Endoscopy;;   CARDIAC CATHETERIZATION  01/2002   90% obstruction in proximal LAD, 60 % obstruction in proximal circumflex and 70%  in proximal RCA. LAD & RCA stented  stents x 2   COLONOSCOPY WITH PROPOFOL N/A 09/25/2021   Procedure: COLONOSCOPY WITH PROPOFOL;  Surgeon: Corbin Ade, MD;  Location: [facility] END[facility] SUITE;  Service: Endoscopy;  Laterality: N/A;  11:00am   L knee replacement  2009   Subsequent infectino requiring resectino arthroplasty with incision and drainage 8/10, and reimplantizathion arthroplasty, 9/20. 5 total surgeries.   PARTIAL PROCTECTOMY BY TEM N/A 12/31/2021   Procedure: TEM PARTIAL PROTECTOMY;  Surgeon: [name], MD;  Location: [facility] ORS;  Service: General;  Laterality:  N/A;    Social History:  reports that he has quit smoking. His smoking use included cigarettes. He has never been exposed to tobacco smoke. His smokeless tobacco use includes chew. He reports that he does not drink alcohol and does not use drugs.   Allergies  Allergen Reactions   Nsaids Other (See Comments)    On blood thinners with h/o IDA/bleeding   Prednisone Other (See Comments)    "makes him feel faint", hyperglycemic     Family History  Problem Relation Age of Onset   Diabetes Mother    Aneurysm Mother        Brain    Stroke Father    Stroke Other    Colon cancer Neg Hx      Prior to Admission medications   Medication Sig Start Date End Date Taking? Authorizing Provider  acetaminophen (TYLENOL) 500 MG tablet Take 1,000 mg by mouth every 6 (six) hours as needed for moderate pain.    [provider]  aspirin EC 81 MG tablet Take 1 tablet (81 mg total) by mouth daily with breakfast. Swallow whole. 01/12/22   Shon Hale, MD  Calcium Polycarbophil (FIBER-LAX PO) Take 1 capsule by mouth daily.    [provider]  carvedilol (COREG) 12.5 MG tablet Take 1 tablet (12.5 mg total) by mouth 2 (two) times daily with a meal. 01/12/22   Emokpae, Courage, MD  feeding supplement (ENSURE SURGERY) LIQD Take 237 mLs by mouth 2 (two) times daily between meals. 02/04/22   [name], MD  furosemide (LASIX) 40 MG tablet Take 1 tablet (40 mg total) by mouth daily. 12/02/21   [name], MD  glipiZIDE (GLUCOTROL XL) 10 MG 24 hr tablet Take 1 tablet (10 mg total) by mouth daily with breakfast. 02/04/22   [name], MD  insulin glargine (LANTUS) 100 UNIT/ML injection Inject 10 Units into the skin at bedtime.    [provider]  LORazepam (ATIVAN) 0.5 MG tablet Take 1 tablet (0.5 mg total) by mouth at bedtime. 02/04/22   [name], MD  metolazone (ZAROXOLYN) 5 MG tablet Take 5 mg by mouth 2 (two) times a week. 05/20/22   [provider]  nitroGLYCERIN (NITROSTAT) 0.4 MG SL tablet Place 1 tablet (0.4 mg total) under the tongue every 5 (five) minutes as needed. 06/30/21   [name], MD  oxyCODONE (OXY IR/ROXICODONE) 5 MG immediate release tablet Take 0.5-1 tablets (2.5-5 mg total) by mouth every 6 (six) hours as needed for moderate pain, severe pain or breakthrough pain. 02/03/22   [name], MD  polyethylene glycol (MIRALAX / GLYCOLAX) 17 g packet Take 17 g by mouth 2 (two) times daily as needed for mild constipation. 02/04/22   [name], MD  rosuvastatin (CRESTOR) 5 MG tablet  Take 5 mg by mouth every Wednesday. 12/02/21   [provider]  tamsulosin (FLOMAX) 0.4 MG CAPS capsule Take 1 capsule (0.4 mg total) by mouth daily. 04/20/22   Jerilee Field, MD  vitamin B-12 (CYANOCOBALAMIN) 500 MCG tablet Take 500 mcg by mouth daily.    [provider]  vitamin C (ASCORBIC ACID) 500 MG tablet Take 500 mg by mouth at bedtime.    [provider]  witch hazel-glycerin (TUCKS) pad Apply topically as needed for irritation or hemorrhoids. 02/04/22   [name], MD  Zinc Oxide 40 % PSTE Apply 1 Application topically as needed. Apply to buttock and sacrum if silicone foam is not  effective due to incontinence 01/12/22   Shon Hale, MD    Physical Exam: BP 127/68 (BP Location: Left Arm)   Pulse 98   Temp 98.3 F (36.8 C) (Oral)   Resp (!) 22   Ht 6' (1.829 m)   Wt 93 kg   SpO2 96%   BMI 27.81 kg/m   General: 82 y.o. year-old male well developed well nourished in no acute distress.  Alert and oriented x3. HEENT: NCAT, EOMI Neck: Supple, trachea medial Cardiovascular: Tachycardia.  Irregularly irregular rate and rhythm with no rubs or gallops.  No thyromegaly or JVD noted.  +2 B/L lower extremity edema. 2/4 pulses in all 4 extremities. Respiratory: Diffuse rales and rhonchi on auscultation.  Abdomen: Soft, nontender nondistended with normal bowel sounds x4 quadrants. Muskuloskeletal:LLE swelling > RLE (chronic since knee replacement per wife at bedside), no cyanosis, clubbing  noted bilaterally Neuro: CN II-XII intact, strength 5/5 x 4, sensation, reflexes intact Skin: No ulcerative lesions noted or rashes Psychiatry: Judgement and insight appear normal. Mood is appropriate for condition and setting          Labs on Admission:  Basic Metabolic Panel: Recent Labs  Lab 09/04/22 1616  NA 129*  K 3.9  CL 97*  CO2 25  GLUCOSE 79  BUN 42*  CREATININE 1.35*  CALCIUM 8.0*   Liver Function Tests: Recent Labs  Lab 09/04/22 1616  AST  36  ALT 27  ALKPHOS 83  BILITOT 0.5  PROT 7.4  ALBUMIN 2.5*   No results for input(s): "LIPASE", "AMYLASE" in the last 168 hours. No results for input(s): "AMMONIA" in the last 168 hours. CBC: Recent Labs  Lab 09/04/22 1616  WBC 5.1  NEUTROABS 3.0  HGB 10.7*  HCT 32.2*  MCV 87.3  PLT PLATELET CLUMPS NOTED ON SMEAR, COUNT APPEARS ADEQUATE   Cardiac Enzymes: No results for input(s): "CKTOTAL", "CKMB", "CKMBINDEX", "TROPONINI" in the last 168 hours.  BNP (last 3 results) Recent Labs    02/02/22 0445 02/03/22 0521 09/04/22 1616  BNP 641.1* 545.6* 380.0*    ProBNP (last 3 results) No results for input(s): "PROBNP" in the last 8760 hours.  CBG: Recent Labs  Lab 09/04/22 1942 09/04/22 2048  GLUCAP 40* 166*    Radiological Exams on Admission: DG Chest Port 1 View  Result Date: 09/04/2022 CLINICAL DATA:  Shortness of breath EXAM: PORTABLE CHEST 1 VIEW COMPARISON:  CXR 01/11/22 FINDINGS: Cardiomegaly. No pleural effusion. No pneumothorax. There are prominent bilateral interstitial opacities which likely represent a combination of patient's known fibrotic lung disease and mild pulmonary edema. There may be a focal retrocardiac opacity, which could represent infection. No radiographically apparent displaced rib fractures. Visualized upper abdomen is unremarkable. Vertebral body heights are maintained. IMPRESSION: 1. Cardiomegaly with mild pulmonary edema. 2. Findings of fibrotic lung disease with a more focal retrocardiac opacity, which could represent superimposed infection. Electronically Signed   By: Lorenza Cambridge M.D.   On: 09/04/2022 16:53    EKG: I independently viewed the EKG done and my findings are as followed: A-fib with RVR with QTc 514 ms  Assessment/Plan Present on Admission:  Acute on chronic diastolic CHF (congestive heart failure) (HCC)  Paroxysmal atrial fibrillation (HCC)  Hyponatremia  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)  Essential  hypertension  MALT lymphoma (HCC)  Principal Problem:   Acute on chronic diastolic CHF (congestive heart failure) (HCC) Active Problems:   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)   Essential hypertension   MALT  lymphoma (HCC)   Hyponatremia   Paroxysmal atrial fibrillation (HCC)   Elevated brain natriuretic peptide (BNP) level   CAP (community acquired pneumonia)   Prolonged QT interval   Hypoalbuminemia due to protein-calorie malnutrition (HCC)   History of CAD (coronary artery disease)  Acute on chronic diastolic CHF Chest x-ray showed cardiomegaly with mild pulmonary edema Continue total input/output, daily weights and fluid restriction Continue IV Lasix 40 mg twice daily Continue heart healthy diet  Echocardiogram done on 11/20/2021 showed LVEF of 65 to 70%.  No RWMA.  Moderate central LVH.  G2 DD.  Echocardiogram will be done in the morning   Elevated BNP BNP 380 (this was 545.6 on 03/02/2022) Continue management as described above  Possible CAP POA Chest x-ray was suggestive of superimposed infection Patient was started on ceftriaxone and azithromycin, we shall continue same at this time with plan to de-escalate/discontinue based on blood culture, sputum culture, urine Legionella, strep pneumo and procalcitonin Continue Tylenol as needed Continue Mucinex, incentive spirometry, flutter valve   Paroxysmal atrial fibrillation with RVR CHADs2VASC score- at least 4.   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  Patient follows with [name]  DVT prophylaxis: Lovenox   Advance Care Planning:   Code Status: DNR   Consults: None  Family Communication: Wife at bedside (all questions answered to laceration)  Severity of Illness: The appropriate patient status for this patient is INPATIENT. Inpatient status is judged to be reasonable and necessary in order to provide the required intensity of service to ensure the patient's safety. The patient's presenting symptoms, physical exam findings, and initial radiographic and laboratory data in the context of their chronic comorbidities is felt to place them at high risk for further clinical deterioration. Furthermore, it is not anticipated that the patient will be medically stable for discharge from the hospital within 2 midnights of admission.   * I certify that at the point of admission it is my clinical judgment that the patient will require inpatient hospital care spanning beyond 2 midnights from the point of admission due to high intensity of service, high risk for further deterioration and high frequency of surveillance required.*  Author: Frankey Shown, DO 09/05/2022 2:05 AM  For on call review [email or web address].

## 2022-09-05 NOTE — Progress Notes (Signed)
0340 pt CBG 55, pt alert & oriented, asymptomatic. Pt was provided with apple juice & crackers  0431 CBG recheck 121.  Pt currently on Q4 CBG checks. Will continue to monitor.

## 2022-09-05 NOTE — Progress Notes (Signed)
  Echocardiogram 2D Echocardiogram has been performed.  Bradley Sheppard 09/05/2022, 4:19 PM

## 2022-09-05 NOTE — Progress Notes (Signed)
PROGRESS NOTE    Bradley Sheppard  WUJ:[id number] DOB: November 25, 1940 DOA: 09/04/2022 PCP: [name], MD   Brief Narrative:    Bradley Sheppard is an 82 y.o. male with medical history significant of type 2 diabetes mellitus, hypertension, CKD 3A, diastolic CHF, rectal mass, MALT lymphoma who presents to the emergency department from home via EMS due to several days of onset of cough, chest congestion and foul-smelling urine.  Patient was admitted with acute on chronic diastolic CHF exacerbation with noted BNP elevation.  He was also thought to have possible community-acquired pneumonia and started on Rocephin and azithromycin.  Assessment & Plan:   Principal Problem:   Acute on chronic diastolic CHF (congestive heart failure) (HCC) Active Problems:   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)   Essential hypertension   MALT lymphoma (HCC)   Hyponatremia   Paroxysmal atrial fibrillation (HCC)   Elevated brain natriuretic peptide (BNP) level   CAP (community acquired pneumonia)   Prolonged QT interval   Hypoalbuminemia due to protein-calorie malnutrition (HCC)   History of CAD (coronary artery disease)  Assessment and Plan:  Acute on chronic diastolic CHF Chest x-ray showed cardiomegaly with mild pulmonary edema Continue total input/output, daily weights and fluid restriction Continue IV Lasix 40 mg twice daily Continue heart healthy diet      Echocardiogram done on 11/20/2021 showed LVEF of 65 to 70%.  No RWMA.  Moderate central LVH.  G2 DD.  Echocardiogram pending Reds clip score 41% 5/4   Elevated BNP BNP 380 (this was 545.6 on 03/02/2022) Continue management as described above   Possible CAP POA Chest x-ray was suggestive of superimposed infection Patient was started on ceftriaxone and azithromycin, we shall continue same at this time with plan to de-escalate/discontinue based on blood culture, sputum culture, urine Legionella, strep pneumo and procalcitonin Continue  Tylenol as needed Continue Mucinex, incentive spirometry, flutter valve    Paroxysmal atrial fibrillation with RVR CHADs2VASC score- at least 4.   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  Patient follows with [name]    DVT prophylaxis:Lovenox Code Status: Full Family Communication: Significant other at bedside 5/4 Disposition Plan:  Status is: Inpatient Remains inpatient appropriate because: Need for IV medications.   Consultants:  None  Procedures:  None  Antimicrobials:  Anti-infectives (From admission, onward)    Start     Dose/Rate Route Frequency Ordered Stop   09/04/22 2300  cefTRIAXone (ROCEPHIN) 1 g in sodium chloride 0.9 % 100 mL IVPB        1 g 200 mL/hr over 30 Minutes Intravenous Every 24 hours 09/04/22 2249  09/04/22 2300  azithromycin (ZITHROMAX) 500 mg in sodium chloride 0.9 % 250 mL IVPB        500 mg 250 mL/hr over 60 Minutes Intravenous Every 24 hours 09/04/22 2249        Subjective: Patient seen and evaluated today with no  new acute complaints or concerns. No acute concerns or events noted overnight.  Objective: Vitals:   09/04/22 2245 09/04/22 2329 09/05/22 0119 09/05/22 0423  BP: 125/77 (!) 146/99 127/68 109/62  Pulse: (!) 120 (!) 103 98 86  Resp: (!) 23 (!) 22 (!) 22 20  Temp: 98 F (36.7 C) 98.2 F (36.8 C) 98.3 F (36.8 C) 98.1 F (36.7 C)  TempSrc: Oral Oral Oral Oral  SpO2: 98% 97% 96% 95%  Weight:      Height:        Intake/Output Summary (Last 24 hours) at 09/05/2022 0732 Last data filed at 09/05/2022 0340 Gross per 24 hour  Intake 349.93 ml  Output 200 ml  Net 149.93 ml   Filed Weights   09/04/22 1542  Weight: 93 kg    Examination:  General exam: Appears calm and comfortable, hard of hearing Respiratory system: Clear to auscultation. Respiratory effort normal. Cardiovascular system: S1 & S2 heard, RRR.  Gastrointestinal system: Abdomen is soft Central nervous system: Alert and awake Extremities: No edema Skin: No significant lesions noted Psychiatry: Flat affect.    Data Reviewed: I have personally reviewed following labs and imaging studies  CBC: Recent Labs  Lab 09/04/22 1616 09/05/22 0250  WBC 5.1 5.0  NEUTROABS 3.0  --   HGB 10.7* 10.3*  HCT 32.2* 30.7*  MCV 87.3 87.2  PLT PLATELET CLUMPS NOTED ON SMEAR, COUNT APPEARS ADEQUATE 174   Basic Metabolic Panel: Recent Labs  Lab 09/04/22 1616 09/05/22 0250  NA 129* 131*  K 3.9 3.5  CL 97* 98  CO2 25 26  GLUCOSE 79 54*  BUN 42* 42*  CREATININE 1.35* 1.43*  CALCIUM 8.0* 7.9*  MG  --  1.8  PHOS  --  3.5   GFR: Estimated Creatinine Clearance: 42.2 mL/min (A) (by C-G formula based on SCr of 1.43 mg/dL (H)). Liver Function Tests: Recent Labs  Lab 09/04/22 1616 09/05/22 0250  AST 36 35  ALT 27 26  ALKPHOS 83 84  BILITOT 0.5 0.6  PROT 7.4 7.1  ALBUMIN 2.5* 2.4*   No results for input(s): "LIPASE", "AMYLASE" in the last 168 hours. No results for input(s): "AMMONIA" in the last 168 hours. Coagulation  Profile: No results for input(s): "INR", "PROTIME" in the last 168 hours. Cardiac Enzymes: No results for input(s): "CKTOTAL", "CKMB", "CKMBINDEX", "TROPONINI" in the last 168 hours. BNP (last 3 results) No results for input(s): "PROBNP" in the last 8760 hours. HbA1C: No results for input(s): "HGBA1C" in the last 72 hours. CBG: Recent Labs  Lab 09/04/22 1942 09/04/22 2048 09/05/22 0340 09/05/22 0431  GLUCAP 40* 166* 55* 121*   Lipid Profile: No results for input(s): "CHOL", "HDL", "LDLCALC", "TRIG", "CHOLHDL", "LDLDIRECT" in the last 72 hours. Thyroid Function Tests: No results for input(s): "TSH", "T4TOTAL", "FREET4", "T3FREE", "THYROIDAB" in the last 72 hours. Anemia Panel: No results for input(s): "VITAMINB12", "FOLATE", "FERRITIN", "TIBC", "IRON", "RETICCTPCT" in the last 72 hours. Sepsis Labs: Recent Labs  Lab 09/05/22 0250  PROCALCITON 0.36    Recent Results (from the past 240 hour(s))  Resp panel by RT-PCR (RSV, Flu A&B, Covid) Anterior Nasal Swab     Status: None   Collection Time: 09/04/22  4:14 PM   Specimen: Anterior Nasal Swab  Result Value Ref Range Status   SARS Coronavirus 2 by RT PCR NEGATIVE NEGATIVE Final    Comment: (NOTE) SARS-CoV-2 target nucleic acids are NOT DETECTED.  The SARS-CoV-2 RNA is generally detectable in upper respiratory specimens during the acute phase of infection. The lowest concentration of SARS-CoV-2 viral copies this assay can detect is 138 copies/mL. A negative result does not preclude SARS-Cov-2 infection and should not be used as the sole basis for treatment or other patient management decisions. A negative result may occur with  improper specimen collection/handling, submission of specimen other than nasopharyngeal swab, presence of viral mutation(s) within the areas targeted by this assay, and inadequate number of viral copies(<138 copies/mL). A negative result must be combined with clinical observations, patient history,  and epidemiological information. The expected result is Negative.  Fact Sheet for Patients:  BloggerCourse.com  Fact Sheet for Healthcare Providers:  SeriousBroker.it  This test is no t yet approved or cleared by the [place] FDA and  has been authorized for detection and/or diagnosis of SARS-CoV-2 by FDA under an Emergency Use Authorization (EUA). This EUA will remain  in effect (meaning this test can be used) for the duration of the COVID-19 declaration under Section 564(b)(1) of the Act, 21 U.S.C.section 360bbb-3(b)(1), unless the authorization is terminated  or revoked sooner.       Influenza A by PCR NEGATIVE NEGATIVE Final   Influenza B by PCR NEGATIVE NEGATIVE Final    Comment: (NOTE) The Xpert Xpress SARS-CoV-2/FLU/RSV plus assay is intended as an aid in the diagnosis of influenza from Nasopharyngeal swab specimens and should not be used as a sole basis for treatment. Nasal washings and aspirates are unacceptable for Xpert Xpress SARS-CoV-2/FLU/RSV testing.  Fact Sheet for Patients: BloggerCourse.com  Fact Sheet for Healthcare Providers: SeriousBroker.it  This test is not yet approved or cleared by the [place] FDA and has been authorized for detection and/or diagnosis of SARS-CoV-2 by FDA under an Emergency Use Authorization (EUA). This EUA will remain in effect (meaning this test can be used) for the duration of the COVID-19 declaration under Section 564(b)(1) of the Act, 21 U.S.C. section 360bbb-3(b)(1), unless the authorization is terminated or revoked.     Resp Syncytial Virus by PCR NEGATIVE NEGATIVE Final    Comment: (NOTE) Fact Sheet for Patients: BloggerCourse.com  Fact Sheet for Healthcare Providers: SeriousBroker.it  This test is not yet approved or cleared by the [place] FDA and has  been authorized for detection and/or diagnosis of SARS-CoV-2 by FDA under an Emergency Use Authorization (EUA). This EUA will remain in effect (meaning this test can be used) for the duration of the COVID-19 declaration under Section 564(b)(1) of the Act, 21 U.S.C. section 360bbb-3(b)(1), unless the authorization is terminated or revoked.  Performed at [facility], [address], [place], [place] [address]   Culture, blood (Routine X 2) w Reflex to ID Panel     Status: None (Preliminary result)   Collection Time: 09/05/22  2:50 AM   Specimen: Right Antecubital; Blood  Result Value Ref Range Status   Specimen Description   Final    RIGHT ANTECUBITAL BOTTLES DRAWN AEROBIC AND ANAEROBIC   Special Requests   Final    Blood Culture adequate volume Performed at [facility], [address] [place], [place] [address]    Culture PENDING  Incomplete   Report Status PENDING  Incomplete  Culture, blood (Routine X 2) w Reflex to ID  Panel     Status: None (Preliminary result)   Collection Time: 09/05/22  2:59 AM   Specimen: Left Antecubital; Blood  Result Value Ref Range Status   Specimen Description   Final    LEFT ANTECUBITAL BOTTLES DRAWN AEROBIC AND ANAEROBIC   Special Requests   Final    Blood Culture adequate volume Performed at [facility], [address] [place], [place] [address]    Culture PENDING  Incomplete   Report Status PENDING  Incomplete         Radiology Studies: DG Chest Port 1 View  Result Date: 09/04/2022 CLINICAL DATA:  Shortness of breath EXAM: PORTABLE CHEST 1 VIEW COMPARISON:  CXR 01/11/22 FINDINGS: Cardiomegaly. No pleural effusion. No pneumothorax. There are prominent bilateral interstitial opacities which likely represent a combination of patient's known fibrotic lung disease and mild pulmonary edema. There may be a focal retrocardiac opacity, which could represent infection. No radiographically apparent displaced rib fractures. Visualized upper abdomen is  unremarkable. Vertebral body heights are maintained. IMPRESSION: 1. Cardiomegaly with mild pulmonary edema. 2. Findings of fibrotic lung disease with a more focal retrocardiac opacity, which could represent superimposed infection. Electronically Signed   By: Lorenza Cambridge M.D.   On: 09/04/2022 16:53        Scheduled Meds:  carvedilol  12.5 mg Oral BID WC   enoxaparin (LOVENOX) injection  40 mg Subcutaneous Q24H   feeding supplement (GLUCERNA SHAKE)  237 mL Oral TID BM   furosemide  40 mg Intravenous BID   [START ON 09/09/2022] rosuvastatin  5 mg Oral Q Wed   Continuous Infusions:  azithromycin 500 mg (09/04/22 2349)   cefTRIAXone (ROCEPHIN)  IV 1 g (09/04/22 2258)     LOS: 1 day    Time spent: 35 minutes    Conya Ellinwood Hoover Brunette, DO [facility]  If 7PM-7AM, please contact night-coverage [email or web address] 09/05/2022, 7:32 AM

## 2022-09-05 NOTE — Progress Notes (Signed)
   09/05/22 1200  ReDS Vest / Clip  BMI (Calculated) 27.5  Station Marker D  Ruler Value 33  ReDS Value Range (!) > 40  ReDS Actual Value 41

## 2022-09-06 DIAGNOSIS — I5033 Acute on chronic diastolic (congestive) heart failure: Secondary | ICD-10-CM | POA: Diagnosis not present

## 2022-09-06 LAB — CBC
HCT: 31.7 % — ABNORMAL LOW (ref 39.0–52.0)
Hemoglobin: 10.4 g/dL — ABNORMAL LOW (ref 13.0–17.0)
MCH: 29 pg (ref 26.0–34.0)
MCHC: 32.8 g/dL (ref 30.0–36.0)
MCV: 88.3 fL (ref 80.0–100.0)
Platelets: 182 10*3/uL (ref 150–400)
RBC: 3.59 MIL/uL — ABNORMAL LOW (ref 4.22–5.81)
RDW: 16 % — ABNORMAL HIGH (ref 11.5–15.5)
WBC: 5.2 10*3/uL (ref 4.0–10.5)
nRBC: 0 % (ref 0.0–0.2)

## 2022-09-06 LAB — BASIC METABOLIC PANEL
Anion gap: 7 (ref 5–15)
BUN: 47 mg/dL — ABNORMAL HIGH (ref 8–23)
CO2: 26 mmol/L (ref 22–32)
Calcium: 7.8 mg/dL — ABNORMAL LOW (ref 8.9–10.3)
Chloride: 100 mmol/L (ref 98–111)
Creatinine, Ser: 1.5 mg/dL — ABNORMAL HIGH (ref 0.61–1.24)
GFR, Estimated: 46 mL/min — ABNORMAL LOW (ref >60)
Glucose, Bld: 102 mg/dL — ABNORMAL HIGH (ref 70–99)
Potassium: 3.8 mmol/L (ref 3.5–5.1)
Sodium: 133 mmol/L — ABNORMAL LOW (ref 135–145)

## 2022-09-06 LAB — GLUCOSE, CAPILLARY
Glucose-Capillary: 112 mg/dL — ABNORMAL HIGH (ref 70–99)
Glucose-Capillary: 113 mg/dL — ABNORMAL HIGH (ref 70–99)
Glucose-Capillary: 144 mg/dL — ABNORMAL HIGH (ref 70–99)
Glucose-Capillary: 235 mg/dL — ABNORMAL HIGH (ref 70–99)
Glucose-Capillary: 242 mg/dL — ABNORMAL HIGH (ref 70–99)
Glucose-Capillary: 252 mg/dL — ABNORMAL HIGH (ref 70–99)

## 2022-09-06 LAB — MAGNESIUM: Magnesium: 1.9 mg/dL (ref 1.7–2.4)

## 2022-09-06 MED ORDER — INSULIN ASPART 100 UNIT/ML IJ SOLN
0.0000 [IU] | Freq: Three times a day (TID) | INTRAMUSCULAR | Status: DC
  Administered 2022-09-07 (×2): 2 [IU] via SUBCUTANEOUS
  Administered 2022-09-08: 3 [IU] via SUBCUTANEOUS
  Administered 2022-09-08: 2 [IU] via SUBCUTANEOUS
  Administered 2022-09-08: 3 [IU] via SUBCUTANEOUS
  Administered 2022-09-09: 5 [IU] via SUBCUTANEOUS
  Administered 2022-09-09: 2 [IU] via SUBCUTANEOUS
  Administered 2022-09-09: 5 [IU] via SUBCUTANEOUS
  Administered 2022-09-10: 1 [IU] via SUBCUTANEOUS

## 2022-09-06 MED ORDER — METOLAZONE 5 MG PO TABS
5.0000 mg | ORAL_TABLET | ORAL | Status: DC
  Administered 2022-09-06 – 2022-09-10 (×3): 5 mg via ORAL
  Filled 2022-09-06 (×4): qty 1

## 2022-09-06 MED ORDER — INSULIN ASPART 100 UNIT/ML IJ SOLN
0.0000 [IU] | Freq: Every day | INTRAMUSCULAR | Status: DC
  Administered 2022-09-06 – 2022-09-08 (×3): 2 [IU] via SUBCUTANEOUS
  Administered 2022-09-09: 3 [IU] via SUBCUTANEOUS

## 2022-09-06 NOTE — Progress Notes (Signed)
PROGRESS NOTE    Bradley Sheppard  ZOX:[id number] DOB: January 01, 1941 DOA: 09/04/2022 PCP: [name], MD   Brief Narrative:    Bradley Sheppard is an 82 y.o. male with medical history significant of type 2 diabetes mellitus, hypertension, CKD 3A, diastolic CHF, rectal mass, MALT lymphoma who presents to the emergency department from home via EMS due to several days of onset of cough, chest congestion and foul-smelling urine.  Patient was admitted with acute on chronic diastolic CHF exacerbation with noted BNP elevation.  He was also thought to have possible community-acquired pneumonia and started on Rocephin and azithromycin.  Assessment & Plan:   Principal Problem:   Acute on chronic diastolic CHF (congestive heart failure) (HCC) Active Problems:   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)   Essential hypertension   MALT lymphoma (HCC)   Hyponatremia   Paroxysmal atrial fibrillation (HCC)   Elevated brain natriuretic peptide (BNP) level   CAP (community acquired pneumonia)   Prolonged QT interval   Hypoalbuminemia due to protein-calorie malnutrition (HCC)   History of CAD (coronary artery disease)  Assessment and Plan:  Acute on chronic diastolic CHF Chest x-ray showed cardiomegaly with mild pulmonary edema Continue total input/output, daily weights and fluid restriction Continue IV Lasix 40 mg twice daily Continue heart healthy diet      Echocardiogram done on 11/20/2021 showed LVEF of 65 to 70%.  No RWMA.  Moderate central LVH.  G2 DD.  Repeat echocardiogram pending Reds clip score 41% 5/4 Dose of metolazone 5/5 and continue to monitor output   Elevated BNP BNP 380 (this was 545.6 on 03/02/2022) Continue management as described above   Possible CAP POA Chest x-ray was suggestive of superimposed infection Patient was started on ceftriaxone and azithromycin, we shall continue same at this time with plan to de-escalate/discontinue based on blood culture, sputum  culture, urine Legionella, strep pneumo and procalcitonin Continue Tylenol as needed Continue Mucinex, incentive spirometry, flutter valve    Paroxysmal atrial fibrillation with RVR CHADs2VASC score- at least 4.   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  Patient follows with [name]    DVT prophylaxis:Lovenox Code Status: Full Family Communication: Significant other at bedside 5/4 Disposition Plan:  Status is: Inpatient Remains inpatient appropriate because: Need for IV medications.   Consultants:  None  Procedures:  None  Antimicrobials:  Anti-infectives (From admission, onward)    Start     Dose/Rate Route Frequency Ordered Stop   09/04/22 2300  cefTRIAXone (ROCEPHIN) 1 g in sodium chloride 0.9 % 100 mL IVPB        1 g 200  mL/hr over 30 Minutes Intravenous Every 24 hours 09/04/22 2249     09/04/22 2300  azithromycin (ZITHROMAX) 500 mg in sodium chloride 0.9 % 250 mL IVPB        500 mg 250 mL/hr over 60 Minutes Intravenous Every 24 hours 09/04/22  2249        Subjective: Patient seen and evaluated today with no new acute complaints or concerns. No acute concerns or events noted overnight.  Objective: Vitals:   09/05/22 2035 09/06/22 0343 09/06/22 0500 09/06/22 0753  BP: 126/85 128/77  (!) 120/93  Pulse: 75 75  (!) 55  Resp:  20    Temp: 98.6 F (37 C) 98.2 F (36.8 C)    TempSrc: Oral Oral    SpO2: 95% 96%  96%  Weight:   94.5 kg   Height:        Intake/Output Summary (Last 24 hours) at 09/06/2022 1053 Last data filed at 09/06/2022 0900 Gross per 24 hour  Intake 1531.93 ml  Output 2450 ml  Net -918.07 ml   Filed Weights   09/04/22 1542 09/05/22 1200 09/06/22 0500  Weight: 93 kg 92 kg 94.5 kg    Examination:  General exam: Appears calm and comfortable, hard of hearing Respiratory system: Clear to auscultation. Respiratory effort normal. Cardiovascular system: S1 & S2 heard, RRR.  Gastrointestinal system: Abdomen is soft Central nervous system: Alert and awake Extremities: No edema Skin: No significant lesions noted Psychiatry: Flat affect.    Data Reviewed: I have personally reviewed following labs and imaging studies  CBC: Recent Labs  Lab 09/04/22 1616 09/05/22 0250 09/06/22 0447  WBC 5.1 5.0 5.2  NEUTROABS 3.0  --   --   HGB 10.7* 10.3* 10.4*  HCT 32.2* 30.7* 31.7*  MCV 87.3 87.2 88.3  PLT PLATELET CLUMPS NOTED ON SMEAR, COUNT APPEARS ADEQUATE 174 182   Basic Metabolic Panel: Recent Labs  Lab 09/04/22 1616 09/05/22 0250 09/06/22 0447  NA 129* 131* 133*  K 3.9 3.5 3.8  CL 97* 98 100  CO2 25 26 26   GLUCOSE 79 54* 102*  BUN 42* 42* 47*  CREATININE 1.35* 1.43* 1.50*  CALCIUM 8.0* 7.9* 7.8*  MG  --  1.8 1.9  PHOS  --  3.5  --    GFR: Estimated Creatinine Clearance: 43.8 mL/min (A) (by C-G formula based on SCr of 1.5 mg/dL (H)). Liver Function Tests: Recent Labs  Lab 09/04/22 1616 09/05/22 0250  AST 36 35  ALT 27 26  ALKPHOS 83 84  BILITOT 0.5 0.6  PROT 7.4 7.1  ALBUMIN 2.5*  2.4*   No results for input(s): "LIPASE", "AMYLASE" in the last 168 hours. No results for input(s): "AMMONIA" in the last 168 hours. Coagulation Profile: No results for input(s): "INR", "PROTIME" in the last 168 hours. Cardiac Enzymes: No results for input(s): "CKTOTAL", "CKMB", "CKMBINDEX", "TROPONINI" in the last 168 hours. BNP (last 3 results) No results for input(s): "PROBNP" in the last 8760 hours. HbA1C: No results for input(s): "HGBA1C" in the last 72 hours. CBG: Recent Labs  Lab 09/05/22 1744 09/05/22 2143 09/06/22 0007 09/06/22 0552 09/06/22 0751  GLUCAP 154* 165* 144* 113* 112*   Lipid Profile: No results for input(s): "CHOL", "HDL", "LDLCALC", "TRIG", "CHOLHDL", "LDLDIRECT" in the last 72 hours. Thyroid Function Tests: No results for input(s): "TSH", "T4TOTAL", "FREET4", "T3FREE", "THYROIDAB" in the last 72 hours. Anemia Panel: No results for input(s): "VITAMINB12", "FOLATE", "FERRITIN", "TIBC", "IRON", "RETICCTPCT" in the last 72 hours. Sepsis Labs: Recent Labs  Lab 09/05/22 0250  PROCALCITON 0.36    Recent Results (from the past 240 hour(s))  Resp panel by RT-PCR (RSV, Flu A&B, Covid) Anterior Nasal Swab     Status: None   Collection Time: 09/04/22  4:14 PM   Specimen: Anterior Nasal Swab  Result Value Ref Range Status   SARS Coronavirus 2 by RT PCR NEGATIVE NEGATIVE Final    Comment: (NOTE) SARS-CoV-2 target nucleic acids are NOT DETECTED.  The SARS-CoV-2 RNA is generally detectable in upper respiratory specimens during the acute phase of infection. The lowest concentration of SARS-CoV-2 viral copies this assay can detect is 138 copies/mL. A negative result does not preclude SARS-Cov-2 infection and should not be used as the sole basis for treatment or other patient management decisions. A negative result may occur with  improper specimen collection/handling, submission of specimen other than nasopharyngeal swab, presence of viral mutation(s) within  the areas targeted by this assay, and inadequate number of viral copies(<138 copies/mL). A negative result must be combined with clinical observations, patient history, and epidemiological information. The expected result is Negative.  Fact Sheet for Patients:  BloggerCourse.com  Fact Sheet for Healthcare Providers:  SeriousBroker.it  This test is no t yet approved or cleared by the [place] FDA and  has been authorized for detection and/or diagnosis of SARS-CoV-2 by FDA under an Emergency Use Authorization (EUA). This EUA will remain  in effect (meaning this test can be used) for the duration of the COVID-19 declaration under Section 564(b)(1) of the Act, 21 U.S.C.section 360bbb-3(b)(1), unless the authorization is terminated  or revoked sooner.       Influenza A by PCR NEGATIVE NEGATIVE Final   Influenza B by PCR NEGATIVE NEGATIVE Final    Comment: (NOTE) The Xpert Xpress SARS-CoV-2/FLU/RSV plus assay is intended as an aid in the diagnosis of influenza from Nasopharyngeal swab specimens and should not be used as a sole basis for treatment. Nasal washings and aspirates are unacceptable for Xpert Xpress SARS-CoV-2/FLU/RSV testing.  Fact Sheet for Patients: BloggerCourse.com  Fact Sheet for Healthcare Providers: SeriousBroker.it  This test is not yet approved or cleared by the [place] FDA and has been authorized for detection and/or diagnosis of SARS-CoV-2 by FDA under an Emergency Use Authorization (EUA). This EUA will remain in effect (meaning this test can be used) for the duration of the COVID-19 declaration under Section 564(b)(1) of the Act, 21 U.S.C. section 360bbb-3(b)(1), unless the authorization is terminated or revoked.     Resp Syncytial Virus by PCR NEGATIVE NEGATIVE Final    Comment: (NOTE) Fact Sheet for  Patients: BloggerCourse.com  Fact Sheet for Healthcare Providers: SeriousBroker.it  This test is not yet approved or cleared by the [place] FDA and has been authorized for detection and/or diagnosis of SARS-CoV-2 by FDA under an Emergency Use Authorization (EUA). This EUA will remain in effect (meaning this test can be used) for the duration of the COVID-19 declaration under Section 564(b)(1) of the Act, 21 U.S.C. section 360bbb-3(b)(1), unless the authorization is terminated or revoked.  Performed at [facility], [address], [place], [place] [address]   Culture, blood (Routine X 2) w Reflex to ID Panel     Status: None (Preliminary result)   Collection Time: 09/05/22  2:50 AM   Specimen: Right Antecubital; Blood  Result Value Ref Range Status   Specimen Description   Final    RIGHT ANTECUBITAL BOTTLES DRAWN AEROBIC AND ANAEROBIC   Special Requests Blood Culture adequate volume  Final   Culture   Final    NO GROWTH 1 DAY Performed at [facility], [address], [place], [place] [address]    Report Status PENDING  Incomplete  Culture, blood (Routine X 2) w Reflex to ID Panel     Status: None (Preliminary result)   Collection Time: 09/05/22  2:59 AM   Specimen: Left Antecubital; Blood  Result Value Ref Range Status   Specimen Description   Final    LEFT ANTECUBITAL BOTTLES DRAWN AEROBIC AND ANAEROBIC   Special Requests Blood Culture adequate volume  Final   Culture   Final    NO GROWTH 1 DAY Performed at [facility], [address], [place], [place] [address]    Report Status PENDING  Incomplete         Radiology Studies: DG Chest Port 1 View  Result Date: 09/04/2022 CLINICAL DATA:  Shortness of breath EXAM: PORTABLE CHEST 1 VIEW COMPARISON:  CXR 01/11/22 FINDINGS: Cardiomegaly. No pleural effusion. No pneumothorax. There are prominent bilateral interstitial opacities which likely represent a combination of  patient's known fibrotic lung disease and mild pulmonary edema. There may be a focal retrocardiac opacity, which could represent infection. No radiographically apparent displaced rib fractures. Visualized upper abdomen is unremarkable. Vertebral body heights are maintained. IMPRESSION: 1. Cardiomegaly with mild pulmonary edema. 2. Findings of fibrotic lung disease with a more focal retrocardiac opacity, which could represent superimposed infection. Electronically Signed   By: Lorenza Cambridge M.D.   On: 09/04/2022 16:53        Scheduled Meds:  aspirin EC  81 mg Oral Q breakfast   carvedilol  12.5 mg Oral BID WC   enoxaparin (LOVENOX) injection  40 mg Subcutaneous Q24H   feeding supplement  237 mL Oral BID BM   feeding supplement (GLUCERNA SHAKE)  237 mL Oral TID BM   furosemide  40 mg Intravenous BID   metolazone  5 mg Oral Once per day on Mon Thu   [START ON 09/09/2022] rosuvastatin  5 mg Oral Q Wed   tamsulosin  0.4 mg Oral Daily   Continuous Infusions:  azithromycin Stopped (09/06/22 0000)   cefTRIAXone (ROCEPHIN)  IV Stopped (09/05/22 2252)     LOS: 2 days    Time spent: 35 minutes    Emanuelle Bastos Hoover Brunette, DO [facility]  If 7PM-7AM, please contact night-coverage [email or web address] 09/06/2022, 10:53 AM

## 2022-09-06 NOTE — TOC Initial Note (Addendum)
Transition of Care [facility]) - Initial/Assessment Note    Patient Details  Name: Bradley Sheppard MRN: [id number] Date of Birth: 03/24/1941  [facility] CM/SW Contact:    Catalina Gravel, LCSW Phone Number: 09/06/2022, 10:36 AM  Clinical Narrative:                 CSW assessed pt at bedside. Patient lives with spouse of 63 years. He and spouse complete  ADLS.  He assists and she gives him a sponge bath as needed.  Spouse drives  and does transportation to appointments.  PT has a cane, walker, hospital bed at home. Pt reports they have a ramp on the home to access easier. TOC to follow.   Expected Discharge Plan: Home/Self Care Barriers to Discharge: Continued Medical Work up   Patient Goals and CMS Choice Patient states their goals for this hospitalization and ongoing recovery are:: Return home          Expected Discharge Plan and Services In-house Referral: Clinical Social Work     Living arrangements for the past 2 months: Single Family Home (Lives with spouse of 62 years)                                      Prior Living Arrangements/Services Living arrangements for the past 2 months: Single Family Home (Lives with spouse of 62 years) Lives with:: Spouse (Lives with spouse of 62 years) Patient language and need for interpreter reviewed:: Yes        Need for Family Participation in Patient Care: Yes (Comment) Care giver support system in place?: Yes (comment)   Criminal Activity/Legal Involvement Pertinent to Current Situation/Hospitalization: No - Comment as needed  Activities of Daily Living Home Assistive Devices/Equipment: Hospital bed, Walker (specify type), Wheelchair, Raised toilet seat with rails, Eyeglasses, CBG Meter ADL Screening (condition at time of admission) Patient's cognitive ability adequate to safely complete daily activities?: Yes Is the patient deaf or have difficulty hearing?: Yes Does the patient have difficulty seeing, even  when wearing glasses/contacts?: No Does the patient have difficulty concentrating, remembering, or making decisions?: No Patient able to express need for assistance with ADLs?: Yes Does the patient have difficulty dressing or bathing?: Yes Independently performs ADLs?: No Communication: Independent Dressing (OT): Needs assistance Is this a change from baseline?: Pre-admission baseline Grooming: Independent Feeding: Independent Bathing: Needs assistance Is this a change from baseline?: Pre-admission baseline Toileting: Needs assistance Is this a change from baseline?: Pre-admission baseline In/Out Bed: Needs assistance Is this a change from baseline?: Pre-admission baseline Walks in Home: Needs assistance Is this a change from baseline?: Pre-admission baseline Does the patient have difficulty walking or climbing stairs?: Yes Weakness of Legs: Both Weakness of Arms/Hands: None  Permission Sought/Granted                  Emotional Assessment Appearance:: Appears stated age, Appears younger than stated age Attitude/Demeanor/Rapport: Gracious, Engaged, Self-Confident Affect (typically observed): Stable Orientation: : Oriented to Self, Oriented to Place, Oriented to  Time, Oriented to Situation (using cell phone, reading messages)   Psych Involvement: No (comment)  Admission diagnosis:  Acute on chronic diastolic CHF (congestive heart failure) (HCC) [I50.33] Systolic congestive heart failure, unspecified HF chronicity (HCC) [I50.20] Patient Active Problem List   Diagnosis Date Noted   Elevated brain natriuretic peptide (BNP) level 09/05/2022   CAP (community acquired pneumonia) 09/05/2022  Prolonged QT interval 09/05/2022   Hypoalbuminemia due to protein-calorie malnutrition (HCC) 09/05/2022   History of CAD (coronary artery disease) 09/05/2022   DNR (do not resuscitate) 02/03/2022   Goals of care, counseling/discussion 01/31/2022   Catheter-associated urinary tract  infection (HCC) 01/30/2022   Physical deconditioning 01/30/2022   Hyponatremia 01/28/2022   Sepsis due to gram-negative UTI (HCC) 01/28/2022   Paroxysmal atrial fibrillation (HCC) 01/28/2022   ILD (interstitial lung disease) (HCC) 01/20/2022   Acute exacerbation of  diastolic CHF (congestive heart failure) (HCC) 01/08/2022   Decubitus skin ulcer 01/08/2022   Urinary retention 01/02/2022   Mass in rectum 12/31/2021   Pressure injury of skin 11/29/2021   Chronic kidney disease, stage 3b (HCC) 11/26/2021   Acute on chronic diastolic CHF (congestive heart failure) (HCC)    SOB (shortness of breath) 11/23/2021   Acute heart failure with preserved ejection fraction (HFpEF) (HCC) 11/23/2021    Class: Acute   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) 11/23/2021   Rectal mass 11/23/2021    Class: Chronic   Bilateral pleural effusion 11/23/2021    Class: Acute   Fecal incontinence alternating with constipation 11/11/2021   MALT lymphoma (HCC) 09/16/2021   Iron deficiency anemia 07/28/2021   Peptic ulcer associated with Helicobacter pylori infection 03/27/2019   Acute on Chronic Anemia superimposed on chronic disease 03/27/2019   Mixed hyperlipidemia 04/03/2009   Essential hypertension 04/03/2009   CORONARY ATHEROSCLEROSIS NATIVE CORONARY ARTERY 04/03/2009   Moderate-Severe Aortic stenosis 02/18/2009   PCP:  [name], MD Pharmacy:   [facility] - [place], [place] - [address] [place] [address] Phone: [phone] Fax: [phone]     Social Determinants of Health (SDOH) Social History: SDOH Screenings   Food Insecurity: No Food Insecurity (09/05/2022)  Housing: Low Risk  (09/05/2022)  Transportation Needs: No Transportation Needs (09/05/2022)  Utilities: Not At Risk (09/05/2022)  Tobacco Use: High Risk (09/04/2022)   SDOH Interventions:     Readmission Risk Interventions    09/06/2022   10:32 AM 09/05/2022    5:41 PM 02/04/2022    9:13 AM   Readmission Risk Prevention Plan  Transportation Screening Complete Complete Complete  Medication Review (RN Care Manager) Complete Complete Complete  PCP or Specialist appointment within 3-5 days of discharge Complete Complete Complete  HRI or Home Care Consult Complete Complete Complete  SW Recovery Care/Counseling Consult  Complete Complete  Palliative Care Screening   Not Applicable  Skilled Nursing Facility   Complete

## 2022-09-07 DIAGNOSIS — I5033 Acute on chronic diastolic (congestive) heart failure: Secondary | ICD-10-CM | POA: Diagnosis not present

## 2022-09-07 LAB — BASIC METABOLIC PANEL
Anion gap: 8 (ref 5–15)
BUN: 50 mg/dL — ABNORMAL HIGH (ref 8–23)
CO2: 27 mmol/L (ref 22–32)
Calcium: 7.9 mg/dL — ABNORMAL LOW (ref 8.9–10.3)
Chloride: 97 mmol/L — ABNORMAL LOW (ref 98–111)
Creatinine, Ser: 1.54 mg/dL — ABNORMAL HIGH (ref 0.61–1.24)
GFR, Estimated: 44 mL/min — ABNORMAL LOW (ref >60)
Glucose, Bld: 102 mg/dL — ABNORMAL HIGH (ref 70–99)
Potassium: 3.7 mmol/L (ref 3.5–5.1)
Sodium: 132 mmol/L — ABNORMAL LOW (ref 135–145)

## 2022-09-07 LAB — CBC
HCT: 31.6 % — ABNORMAL LOW (ref 39.0–52.0)
Hemoglobin: 10.6 g/dL — ABNORMAL LOW (ref 13.0–17.0)
MCH: 29.2 pg (ref 26.0–34.0)
MCHC: 33.5 g/dL (ref 30.0–36.0)
MCV: 87.1 fL (ref 80.0–100.0)
Platelets: 219 10*3/uL (ref 150–400)
RBC: 3.63 MIL/uL — ABNORMAL LOW (ref 4.22–5.81)
RDW: 15.9 % — ABNORMAL HIGH (ref 11.5–15.5)
WBC: 6.7 10*3/uL (ref 4.0–10.5)
nRBC: 0 % (ref 0.0–0.2)

## 2022-09-07 LAB — GLUCOSE, CAPILLARY
Glucose-Capillary: 106 mg/dL — ABNORMAL HIGH (ref 70–99)
Glucose-Capillary: 153 mg/dL — ABNORMAL HIGH (ref 70–99)
Glucose-Capillary: 153 mg/dL — ABNORMAL HIGH (ref 70–99)
Glucose-Capillary: 166 mg/dL — ABNORMAL HIGH (ref 70–99)
Glucose-Capillary: 225 mg/dL — ABNORMAL HIGH (ref 70–99)

## 2022-09-07 LAB — MAGNESIUM: Magnesium: 1.9 mg/dL (ref 1.7–2.4)

## 2022-09-07 LAB — HEMOGLOBIN A1C
Hgb A1c MFr Bld: 7.5 % — ABNORMAL HIGH (ref 4.8–5.6)
Mean Plasma Glucose: 168.55 mg/dL

## 2022-09-07 LAB — LEGIONELLA PNEUMOPHILA SEROGP 1 UR AG: L. pneumophila Serogp 1 Ur Ag: NEGATIVE

## 2022-09-07 NOTE — Evaluation (Signed)
Physical Therapy Evaluation Patient Details Name: Bradley Sheppard MRN: [id number] DOB: [birth year] Today's Date: 09/07/2022  History of Present Illness  Bradley Sheppard is an 82 y.o. male with medical history significant of type 2 diabetes mellitus, hypertension, CKD 3A, diastolic CHF, rectal mass, MALT lymphoma who presents to the emergency department from home via EMS due to several days of onset of cough, chest congestion and foul-smelling urine.  He had a virtual visit with his PCP who started him on antibiotics (cefuroxime) without much relief of symptoms.  Per wife at bedside, patient has become more fatigued and weaker and has not been eating within the last 2 to 3 days, blood glucose at home was also said to be in the low range.   Clinical Impression  Patient demonstrates slow labored movement for sitting up at bedside, unable to flex left knee which is baseline per patient, and unable to stand or transfer to chair after repeated attempts due to BLE weakness with RLE buckling.  Patient demonstrates fair/good return for using RLE and BUE for repositioning self when put back to bed.  Patient will benefit from continued skilled physical therapy in hospital and recommended venue below to increase strength, balance, endurance for safe ADLs and gait.         Recommendations for follow up therapy are one component of a multi-disciplinary discharge planning process, led by the attending physician.  Recommendations may be updated based on patient status, additional functional criteria and insurance authorization.  Follow Up Recommendations Can patient physically be transported by private vehicle: No     Assistance Recommended at Discharge    Patient can return home with the following  A lot of help with bathing/dressing/bathroom;A lot of help with walking and/or transfers;Help with stairs or ramp for entrance;Assistance with cooking/housework    Equipment Recommendations None recommended by PT   Recommendations for Other Services       Functional Status Assessment Patient has had a recent decline in their functional status and demonstrates the ability to make significant improvements in function in a reasonable and predictable amount of time.     Precautions / Restrictions Precautions Precautions: Fall Restrictions Weight Bearing Restrictions: No      Mobility  Bed Mobility Overal bed mobility: Needs Assistance Bed Mobility: Supine to Sit, Sit to Supine     Supine to sit: Mod assist, Max assist Sit to supine: Mod assist   General bed mobility comments: unsteady labored movement    Transfers Overall transfer level: Needs assistance Equipment used: 1 person hand held assist Transfers: Sit to/from Stand Sit to Stand: Max assist           General transfer comment: Max assist to partially stand, unable to fully extend right knee due to weaknes or flex left knee due to stiffiness with quad contractures    Ambulation/Gait                  Stairs            Wheelchair Mobility    Modified Rankin (Stroke Patients Only)       Balance Overall balance assessment: Needs assistance Sitting-balance support: Feet supported, No upper extremity supported Sitting balance-Leahy Scale: Fair Sitting balance - Comments: fair/good seated at EOB   Standing balance support: During functional activity, Bilateral upper extremity supported, Reliant on assistive device for balance Standing balance-Leahy Scale: Zero Standing balance comment: poor/zero  Pertinent Vitals/Pain Pain Assessment Pain Assessment: No/denies pain    Home Living Family/patient expects to be discharged to:: Private residence   Available Help at Discharge: Available 24 hours/day;Family Type of Home: House Home Access: Stairs to enter   [employer] of Steps: 1   Home Layout: One level Home Equipment: Agricultural consultant (2  wheels);Rollator (4 wheels);BSC/3in1;Tub bench;Transport chair      Prior Function Prior Level of Function : Needs assist       Physical Assist : Mobility (physical);ADLs (physical) Mobility (physical): Bed mobility;Transfers;Gait;Stairs   Mobility Comments: assisted for sitting up in bed, completing sit to stands, and followed with wheelchair by family members when walking short household distances in home ADLs Comments: Assisted by family     Hand Dominance   Dominant Hand: Right    Extremity/Trunk Assessment   Upper Extremity Assessment Upper Extremity Assessment: Generalized weakness    Lower Extremity Assessment Lower Extremity Assessment: Generalized weakness    Cervical / Trunk Assessment Cervical / Trunk Assessment: Normal  Communication   Communication: No difficulties  Cognition Arousal/Alertness: Awake/alert Behavior During Therapy: WFL for tasks assessed/performed Overall Cognitive Status: Within Functional Limits for tasks assessed                                          General Comments      Exercises     Assessment/Plan    PT Assessment Patient needs continued PT services  PT Problem List Decreased strength;Decreased activity tolerance;Decreased balance;Decreased mobility;Decreased range of motion       PT Treatment Interventions DME instruction;Gait training;Functional mobility training;Therapeutic activities;Therapeutic exercise;Patient/family education;Wheelchair mobility training;Balance training    PT Goals (Current goals can be found in the Care Plan section)  Acute Rehab PT Goals Patient Stated Goal: return home with family to assist PT Goal Formulation: With patient Time For Goal Achievement: 09/21/22 Potential to Achieve Goals: Good    Frequency Min 3X/week     Co-evaluation               AM-PAC PT "6 Clicks" Mobility  Outcome Measure Help needed turning from your back to your side while in a flat bed  without using bedrails?: A Lot Help needed moving from lying on your back to sitting on the side of a flat bed without using bedrails?: A Lot Help needed moving to and from a bed to a chair (including a wheelchair)?: Total Help needed standing up from a chair using your arms (e.g., wheelchair or bedside chair)?: A Lot Help needed to walk in hospital room?: Total Help needed climbing 3-5 steps with a railing? : Total 6 Click Score: 9    End of Session   Activity Tolerance: Patient tolerated treatment well;Patient limited by fatigue Patient left: in bed;with call bell/phone within reach Nurse Communication: Mobility status PT Visit Diagnosis: Unsteadiness on feet (R26.81);Other abnormalities of gait and mobility (R26.89);Muscle weakness (generalized) (M62.81)    Time: 1914-7829 PT Time Calculation (min) (ACUTE ONLY): 26 min   Charges:   PT Evaluation $PT Eval Moderate Complexity: 1 Mod PT Treatments $Therapeutic Activity: 23-37 mins        12:29 PM, 09/07/22 Ocie Bob, MPT Physical Therapist with [facility] [phone] office [phone] mobile phone

## 2022-09-07 NOTE — NC FL2 (Signed)
[place] MEDICAID FL2 LEVEL OF CARE FORM     IDENTIFICATION  Patient Name: Bradley Sheppard Birthdate: 04/20/1941 Sex: male Admission Date (Current Location): 09/04/2022  [facility] and [place] Number:  [employer] and Address:  [facility],  [address], [place] [address]      Provider Number: [id number]  Attending Physician Name and Address:  Erick Blinks, DO  Relative Name and Phone Number:       Current Level of Care: Hospital Recommended Level of Care: Skilled Nursing Facility Prior Approval Number:    Date Approved/Denied:   PASRR Number: [id number]  Discharge Plan: SNF    Current Diagnoses: Patient Active Problem List   Diagnosis Date Noted   Elevated brain natriuretic peptide (BNP) level 09/05/2022   CAP (community acquired pneumonia) 09/05/2022   Prolonged QT interval 09/05/2022   Hypoalbuminemia due to protein-calorie malnutrition (HCC) 09/05/2022   History of CAD (coronary artery disease) 09/05/2022   DNR (do not resuscitate) 02/03/2022   Goals of care, counseling/discussion 01/31/2022   Catheter-associated urinary tract infection (HCC) 01/30/2022   Physical deconditioning 01/30/2022   Hyponatremia 01/28/2022   Sepsis due to gram-negative UTI (HCC) 01/28/2022   Paroxysmal atrial fibrillation (HCC) 01/28/2022   ILD (interstitial lung disease) (HCC) 01/20/2022   Acute exacerbation of  diastolic CHF (congestive heart failure) (HCC) 01/08/2022   Decubitus skin ulcer 01/08/2022   Urinary retention 01/02/2022   Mass in rectum 12/31/2021   Pressure injury of skin 11/29/2021   Chronic kidney disease, stage 3b (HCC) 11/26/2021   Acute on chronic diastolic CHF (congestive heart failure) (HCC)    SOB (shortness of breath) 11/23/2021   Acute heart failure with preserved ejection fraction (HFpEF) (HCC) 11/23/2021   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) 11/23/2021   Rectal mass 11/23/2021   Bilateral pleural effusion  11/23/2021   Fecal incontinence alternating with constipation 11/11/2021   MALT lymphoma (HCC) 09/16/2021   Iron deficiency anemia 07/28/2021   Peptic ulcer associated with Helicobacter pylori infection 03/27/2019   Acute on Chronic Anemia superimposed on chronic disease 03/27/2019   Mixed hyperlipidemia 04/03/2009   Essential hypertension 04/03/2009   CORONARY ATHEROSCLEROSIS NATIVE CORONARY ARTERY 04/03/2009   Moderate-Severe Aortic stenosis 02/18/2009    Orientation RESPIRATION BLADDER Height & Weight     Self, Time, Situation, Place  Normal Continent Weight: 205 lb 11 oz (93.3 kg) Height:  6' (182.9 cm)  BEHAVIORAL SYMPTOMS/MOOD NEUROLOGICAL BOWEL NUTRITION STATUS      Continent Diet (see dc summary)  AMBULATORY STATUS COMMUNICATION OF NEEDS Skin   Extensive Assist   Normal                       Personal Care Assistance Level of Assistance  Bathing, Feeding, Dressing Bathing Assistance: Limited assistance Feeding assistance: Independent Dressing Assistance: Limited assistance     Functional Limitations Info  Sight, Hearing, Speech Sight Info: Impaired Hearing Info: Impaired Speech Info: Adequate    SPECIAL CARE FACTORS FREQUENCY  PT (By licensed PT), OT (By licensed OT)     PT Frequency: 5x week OT Frequency: 3x week            Contractures Contractures Info: Not present    Additional Factors Info  Code Status, Allergies Code Status Info: DNR Allergies Info: Nsaids, Prednisone           Current Medications (09/07/2022):  This is the current hospital active medication list Current Facility-Administered Medications  Medication Dose  Route Frequency Provider Last Rate Last Admin   acetaminophen (TYLENOL) tablet 650 mg  650 mg Oral Q6H PRN Adefeso, Oladapo, DO   650 mg at 09/05/22 1509   Or   acetaminophen (TYLENOL) suppository 650 mg  650 mg Rectal Q6H PRN Adefeso, Oladapo, DO       aspirin EC tablet 81 mg  81 mg Oral Q breakfast Sherryll Burger, Pratik D, DO    81 mg at 09/07/22 0753   azithromycin (ZITHROMAX) 500 mg in sodium chloride 0.9 % 250 mL IVPB  500 mg Intravenous Q24H Adefeso, Oladapo, DO 250 mL/hr at 09/06/22 0044 500 mg at 09/06/22 0044   carvedilol (COREG) tablet 12.5 mg  12.5 mg Oral BID WC Adefeso, Oladapo, DO   12.5 mg at 09/07/22 0811   cefTRIAXone (ROCEPHIN) 1 g in sodium chloride 0.9 % 100 mL IVPB  1 g Intravenous Q24H Adefeso, Oladapo, DO 200 mL/hr at 09/06/22 2254 1 g at 09/06/22 2254   enoxaparin (LOVENOX) injection 40 mg  40 mg Subcutaneous Q24H Adefeso, Oladapo, DO   40 mg at 09/07/22 0752   feeding supplement (ENSURE SURGERY) liquid 237 mL  237 mL Oral BID BM Shah, Pratik D, DO       feeding supplement (GLUCERNA SHAKE) (GLUCERNA SHAKE) liquid 237 mL  237 mL Oral TID BM Adefeso, Oladapo, DO   237 mL at 09/07/22 1213   furosemide (LASIX) injection 40 mg  40 mg Intravenous BID Adefeso, Oladapo, DO   40 mg at 09/07/22 0753   guaiFENesin-dextromethorphan (ROBITUSSIN DM) 100-10 MG/5ML syrup 5 mL  5 mL Oral Q4H PRN Adefeso, Oladapo, DO       insulin aspart (novoLOG) injection 0-5 Units  0-5 Units Subcutaneous QHS Sherryll Burger, Pratik D, DO   2 Units at 09/06/22 2257   insulin aspart (novoLOG) injection 0-9 Units  0-9 Units Subcutaneous TID WC Shah, Pratik D, DO   2 Units at 09/07/22 1212   metolazone (ZAROXOLYN) tablet 5 mg  5 mg Oral Once per day on Mon Thu Shah, Pratik D, DO   5 mg at 09/07/22 1610   Muscle Rub CREA   Topical PRN Maurilio Lovely D, DO   Given at 09/07/22 1408   ondansetron (ZOFRAN) injection 4 mg  4 mg Intravenous Q6H PRN Adefeso, Oladapo, DO       oxyCODONE (Oxy IR/ROXICODONE) immediate release tablet 2.5-5 mg  2.5-5 mg Oral Q6H PRN Sherryll Burger, Pratik D, DO   5 mg at 09/07/22 1408   prochlorperazine (COMPAZINE) injection 10 mg  10 mg Intravenous Q6H PRN Adefeso, Oladapo, DO       [START ON 09/09/2022] rosuvastatin (CRESTOR) tablet 5 mg  5 mg Oral Q Wed Adefeso, Oladapo, DO       tamsulosin (FLOMAX) capsule 0.4 mg  0.4 mg Oral Daily Sherryll Burger,  Pratik D, DO   0.4 mg at 09/07/22 9604     Discharge Medications: Please see discharge summary for a list of discharge medications.  Relevant Imaging Results:  Relevant Lab Results:   Additional Information SSN: [id number], LCSW

## 2022-09-07 NOTE — TOC Progression Note (Signed)
Transition of Care [facility]) - Progression Note    Patient Details  Name: Bradley Sheppard MRN: [id number] Date of Birth: [birth year]  [facility] CM/SW Contact  Elliot Gault, LCSW Phone Number: 09/07/2022, 2:21 PM  Clinical Narrative:     TOC following. PT recommending SNF rehab at dc. Spoke with pt, his wife, and his daughter to review dc planning. Pt's wife and dtr state that pt has been assist of one to stand and pivot to wheelchair/BSC/Etc. PT note indicates pt unable to fully stand at this time due to weakness. Pt's wife states that when pt gets ill, he will become very weak like this and will need time to build back up his strength.   Pt is agreeable to SNF rehab and family indicating that they intend for him to come home after the rehab.  CMS provider options reviewed and will refer as requested and start insurance auth process.  TOC will follow.  Expected Discharge Plan: Skilled Nursing Facility Barriers to Discharge: Continued Medical Work up  Expected Discharge Plan and Services In-house Referral: Clinical Social Work   Post Acute Care Choice: Skilled Nursing Facility Living arrangements for the past 2 months: Single Family Home (Lives with spouse of 62 years)                                       Social Determinants of Health (SDOH) Interventions SDOH Screenings   Food Insecurity: No Food Insecurity (09/05/2022)  Housing: Low Risk  (09/05/2022)  Transportation Needs: No Transportation Needs (09/05/2022)  Utilities: Not At Risk (09/05/2022)  Tobacco Use: High Risk (09/04/2022)    Readmission Risk Interventions    09/06/2022   10:32 AM 09/05/2022    5:41 PM 02/04/2022    9:13 AM  Readmission Risk Prevention Plan  Transportation Screening Complete Complete Complete  Medication Review Oceanographer) Complete Complete Complete  PCP or Specialist appointment within 3-5 days of discharge Complete Complete Complete  HRI or Home Care Consult Complete  Complete Complete  SW Recovery Care/Counseling Consult  Complete Complete  Palliative Care Screening   Not Applicable  Skilled Nursing Facility   Complete

## 2022-09-07 NOTE — Progress Notes (Signed)
PROGRESS NOTE    Bradley Sheppard  ZOX:[id number] DOB: 1940/09/20 DOA: 09/04/2022 PCP: [name], MD   Brief Narrative:    Bradley Sheppard is an 82 y.o. male with medical history significant of type 2 diabetes mellitus, hypertension, CKD 3A, diastolic CHF, rectal mass, MALT lymphoma who presents to the emergency department from home via EMS due to several days of onset of cough, chest congestion and foul-smelling urine.  Patient was admitted with acute on chronic diastolic CHF exacerbation with noted BNP elevation.  He was also thought to have possible community-acquired pneumonia and started on Rocephin and azithromycin.  PT recommending SNF and patient and family agreeable.  Assessment & Plan:   Principal Problem:   Acute on chronic diastolic CHF (congestive heart failure) (HCC) Active Problems:   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)   Essential hypertension   MALT lymphoma (HCC)   Hyponatremia   Paroxysmal atrial fibrillation (HCC)   Elevated brain natriuretic peptide (BNP) level   CAP (community acquired pneumonia)   Prolonged QT interval   Hypoalbuminemia due to protein-calorie malnutrition (HCC)   History of CAD (coronary artery disease)  Assessment and Plan:  Acute on chronic diastolic CHF Chest x-ray showed cardiomegaly with mild pulmonary edema Continue total input/output, daily weights and fluid restriction Continue IV Lasix 40 mg twice daily Continue heart healthy diet      Echocardiogram done on 11/20/2021 showed LVEF of 65 to 70%.  No RWMA.  Moderate central LVH.  G2 DD.  Repeat echocardiogram pending Reds clip score 38% since 5/5 Dose of metolazone 5/5 and continue to monitor output   Elevated BNP BNP 380 (this was 545.6 on 03/02/2022) Continue management as described above   Possible CAP POA Chest x-ray was suggestive of superimposed infection Patient was started on ceftriaxone and azithromycin, we shall continue same at this time with plan to  de-escalate/discontinue based on blood culture, sputum culture, urine Legionella, strep pneumo and procalcitonin Continue Tylenol as needed Continue Mucinex, incentive spirometry, flutter valve    Paroxysmal atrial fibrillation with RVR CHADs2VASC score- at least 4.   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  Patient follows with [name]    DVT prophylaxis:Lovenox Code Status: Full Family Communication: Significant other at bedside 5/4 Disposition Plan:  Status is: Inpatient Remains inpatient appropriate because: Need for IV medications.   Consultants:  None  Procedures:  None  Antimicrobials:  Anti-infectives (From admission, onward)    Start     Dose/Rate Route Frequency Ordered Stop   09/04/22 2300  cefTRIAXone (ROCEPHIN) 1 g in sodium chloride 0.9 % 100 mL IVPB  1 g 200 mL/hr over 30 Minutes Intravenous Every 24 hours 09/04/22 2249     09/04/22 2300  azithromycin (ZITHROMAX) 500 mg in sodium chloride 0.9 % 250 mL IVPB        500 mg 250 mL/hr  over 60 Minutes Intravenous Every 24 hours 09/04/22 2249        Subjective: Patient seen and evaluated today with no new acute complaints or concerns. No acute concerns or events noted overnight.  Objective: Vitals:   09/06/22 1324 09/06/22 2009 09/07/22 0419 09/07/22 1211  BP:  (!) 140/85 (!) 141/72 125/68  Pulse: 98 86 92 91  Resp:    16  Temp:  98 F (36.7 C) 98.1 F (36.7 C) (!) 97.4 F (36.3 C)  TempSrc:  Oral Oral Oral  SpO2:  97% 98% 96%  Weight:   93.3 kg   Height:        Intake/Output Summary (Last 24 hours) at 09/07/2022 1442 Last data filed at 09/07/2022 1100 Gross per 24 hour  Intake 480 ml  Output 3450 ml  Net -2970 ml   Filed Weights   09/05/22 1200 09/06/22 0500 09/07/22 0419  Weight: 92 kg 94.5 kg 93.3 kg    Examination:  General exam: Appears calm and comfortable, hard of hearing Respiratory system: Clear to auscultation. Respiratory effort normal. Cardiovascular system: S1 & S2 heard, RRR.  Gastrointestinal system: Abdomen is soft Central nervous system: Alert and awake Extremities: No edema Skin: No significant lesions noted Psychiatry: Flat affect.    Data Reviewed: I have personally reviewed following labs and imaging studies  CBC: Recent Labs  Lab 09/04/22 1616 09/05/22 0250 09/06/22 0447 09/07/22 0520  WBC 5.1 5.0 5.2 6.7  NEUTROABS 3.0  --   --   --   HGB 10.7* 10.3* 10.4* 10.6*  HCT 32.2* 30.7* 31.7* 31.6*  MCV 87.3 87.2 88.3 87.1  PLT PLATELET CLUMPS NOTED ON SMEAR, COUNT APPEARS ADEQUATE 174 182 219   Basic Metabolic Panel: Recent Labs  Lab 09/04/22 1616 09/05/22 0250 09/06/22 0447 09/07/22 0520  NA 129* 131* 133* 132*  K 3.9 3.5 3.8 3.7  CL 97* 98 100 97*  CO2 25 26 26 27   GLUCOSE 79 54* 102* 102*  BUN 42* 42* 47* 50*  CREATININE 1.35* 1.43* 1.50* 1.54*  CALCIUM 8.0* 7.9* 7.8* 7.9*  MG  --  1.8 1.9 1.9  PHOS  --  3.5  --   --    GFR: Estimated Creatinine Clearance: 42.4 mL/min (A) (by C-G formula based on SCr  of 1.54 mg/dL (H)). Liver Function Tests: Recent Labs  Lab 09/04/22 1616 09/05/22 0250  AST 36 35  ALT 27 26  ALKPHOS 83 84  BILITOT 0.5 0.6  PROT 7.4 7.1  ALBUMIN 2.5* 2.4*   No results for input(s): "LIPASE", "AMYLASE" in the last 168 hours. No results for input(s): "AMMONIA" in the last 168 hours. Coagulation Profile: No results for input(s): "INR", "PROTIME" in the last 168 hours. Cardiac Enzymes: No results for input(s): "CKTOTAL", "CKMB", "CKMBINDEX", "TROPONINI" in the last 168 hours. BNP (last 3 results) No results for input(s): "PROBNP" in the last 8760 hours. HbA1C: Recent Labs    09/06/22 1744  HGBA1C 7.5*   CBG: Recent Labs  Lab 09/06/22 1127 09/06/22 1627 09/06/22 2014 09/07/22 0745 09/07/22 1123  GLUCAP 235* 252* 242* 106* 153*   Lipid Profile: No results for input(s): "CHOL", "HDL", "LDLCALC", "TRIG", "CHOLHDL", "LDLDIRECT" in the last 72 hours. Thyroid Function Tests: No results  for input(s): "TSH", "T4TOTAL", "FREET4", "T3FREE", "THYROIDAB" in the last 72 hours. Anemia Panel: No results for input(s): "VITAMINB12", "FOLATE", "FERRITIN", "TIBC", "IRON", "RETICCTPCT" in the last 72 hours. Sepsis Labs: Recent Labs  Lab 09/05/22 0250  PROCALCITON 0.36    Recent Results (from the past 240 hour(s))  Resp panel by RT-PCR (RSV, Flu A&B, Covid) Anterior Nasal Swab     Status: None   Collection Time: 09/04/22  4:14 PM   Specimen: Anterior Nasal Swab  Result Value Ref Range Status   SARS Coronavirus 2 by RT PCR NEGATIVE NEGATIVE Final    Comment: (NOTE) SARS-CoV-2 target nucleic acids are NOT DETECTED.  The SARS-CoV-2 RNA is generally detectable in upper respiratory specimens during the acute phase of infection. The lowest concentration of SARS-CoV-2 viral copies this assay can detect is 138 copies/mL. A negative result does not preclude SARS-Cov-2 infection and should not be used as the sole basis for treatment or other patient management  decisions. A negative result may occur with  improper specimen collection/handling, submission of specimen other than nasopharyngeal swab, presence of viral mutation(s) within the areas targeted by this assay, and inadequate number of viral copies(<138 copies/mL). A negative result must be combined with clinical observations, patient history, and epidemiological information. The expected result is Negative.  Fact Sheet for Patients:  BloggerCourse.com  Fact Sheet for Healthcare Providers:  SeriousBroker.it  This test is no t yet approved or cleared by the [place] FDA and  has been authorized for detection and/or diagnosis of SARS-CoV-2 by FDA under an Emergency Use Authorization (EUA). This EUA will remain  in effect (meaning this test can be used) for the duration of the COVID-19 declaration under Section 564(b)(1) of the Act, 21 U.S.C.section 360bbb-3(b)(1), unless the authorization is terminated  or revoked sooner.       Influenza A by PCR NEGATIVE NEGATIVE Final   Influenza B by PCR NEGATIVE NEGATIVE Final    Comment: (NOTE) The Xpert Xpress SARS-CoV-2/FLU/RSV plus assay is intended as an aid in the diagnosis of influenza from Nasopharyngeal swab specimens and should not be used as a sole basis for treatment. Nasal washings and aspirates are unacceptable for Xpert Xpress SARS-CoV-2/FLU/RSV testing.  Fact Sheet for Patients: BloggerCourse.com  Fact Sheet for Healthcare Providers: SeriousBroker.it  This test is not yet approved or cleared by the [place] FDA and has been authorized for detection and/or diagnosis of SARS-CoV-2 by FDA under an Emergency Use Authorization (EUA). This EUA will remain in effect (meaning this test can be used) for the duration of the COVID-19 declaration under Section 564(b)(1) of the Act, 21 U.S.C. section 360bbb-3(b)(1), unless the  authorization is terminated or revoked.     Resp Syncytial Virus by PCR NEGATIVE NEGATIVE Final    Comment: (NOTE) Fact Sheet for Patients: BloggerCourse.com  Fact Sheet for Healthcare Providers: SeriousBroker.it  This test is not yet approved or cleared by the [place] FDA and has been authorized for detection and/or diagnosis of SARS-CoV-2 by FDA under an Emergency Use Authorization (EUA). This EUA will remain in effect (meaning this test can be used) for the duration of the COVID-19 declaration under Section 564(b)(1) of the Act, 21 U.S.C. section 360bbb-3(b)(1), unless the authorization is terminated or revoked.  Performed at [facility], [address], [place], [place] [address]   Culture, blood (Routine X 2) w Reflex to ID Panel     Status: None (Preliminary result)   Collection Time: 09/05/22  2:50 AM   Specimen: Right  Antecubital; Blood  Result Value Ref Range Status   Specimen Description   Final    RIGHT ANTECUBITAL BOTTLES DRAWN AEROBIC AND ANAEROBIC   Special Requests Blood Culture adequate volume  Final   Culture   Final    NO GROWTH 1 DAY Performed at [facility], [address] [place], [place] [address]    Report Status PENDING  Incomplete  Culture, blood (Routine X 2) w Reflex to ID Panel     Status: None (Preliminary result)   Collection Time: 09/05/22  2:59 AM   Specimen: Left Antecubital; Blood  Result Value Ref Range Status   Specimen Description   Final    LEFT ANTECUBITAL BOTTLES DRAWN AEROBIC AND ANAEROBIC   Special Requests Blood Culture adequate volume  Final   Culture   Final    NO GROWTH 1 DAY Performed at [facility], [address] [place], [place] [address]    Report Status PENDING  Incomplete         Radiology Studies: No results found.      Scheduled Meds:  aspirin EC  81 mg Oral Q breakfast   carvedilol  12.5 mg Oral BID WC   enoxaparin (LOVENOX) injection  40 mg  Subcutaneous Q24H   feeding supplement  237 mL Oral BID BM   feeding supplement (GLUCERNA SHAKE)  237 mL Oral TID BM   furosemide  40 mg Intravenous BID   insulin aspart  0-5 Units Subcutaneous QHS   insulin aspart  0-9 Units Subcutaneous TID WC   metolazone  5 mg Oral Once per day on Mon Thu   [START ON 09/09/2022] rosuvastatin  5 mg Oral Q Wed   tamsulosin  0.4 mg Oral Daily   Continuous Infusions:  azithromycin 500 mg (09/06/22 0044)   cefTRIAXone (ROCEPHIN)  IV 1 g (09/06/22 2254)     LOS: 3 days    Time spent: 35 minutes    Mackinzie Vuncannon Hoover Brunette, DO [facility]  If 7PM-7AM, please contact night-coverage [email or web address] 09/07/2022, 2:42 PM

## 2022-09-07 NOTE — Plan of Care (Signed)
  Problem: Acute Rehab PT Goals(only PT should resolve) Goal: Pt Will Go Supine/Side To Sit Outcome: Progressing Flowsheets (Taken 09/07/2022 1231) Pt will go Supine/Side to Sit:  with minimal assist  with moderate assist Goal: Patient Will Transfer Sit To/From Stand Outcome: Progressing Flowsheets (Taken 09/07/2022 1231) Patient will transfer sit to/from stand:  with moderate assist  with maximum assist Goal: Pt Will Transfer Bed To Chair/Chair To Bed Outcome: Progressing Flowsheets (Taken 09/07/2022 1231) Pt will Transfer Bed to Chair/Chair to Bed:  with mod assist  with max assist Goal: Pt Will Ambulate Outcome: Progressing Flowsheets (Taken 09/07/2022 1231) Pt will Ambulate:  10 feet  with moderate assist  with maximum assist  with rolling walker   12:31 PM, 09/07/22 Ocie Bob, MPT Physical Therapist with [facility] [phone] office [phone] mobile phone

## 2022-09-08 DIAGNOSIS — I5033 Acute on chronic diastolic (congestive) heart failure: Secondary | ICD-10-CM | POA: Diagnosis not present

## 2022-09-08 LAB — GLUCOSE, CAPILLARY
Glucose-Capillary: 140 mg/dL — ABNORMAL HIGH (ref 70–99)
Glucose-Capillary: 181 mg/dL — ABNORMAL HIGH (ref 70–99)
Glucose-Capillary: 224 mg/dL — ABNORMAL HIGH (ref 70–99)
Glucose-Capillary: 213 mg/dL — ABNORMAL HIGH (ref 70–99)
Glucose-Capillary: 214 mg/dL — ABNORMAL HIGH (ref 70–99)

## 2022-09-08 LAB — BASIC METABOLIC PANEL
Anion gap: 9 (ref 5–15)
BUN: 54 mg/dL — ABNORMAL HIGH (ref 8–23)
CO2: 27 mmol/L (ref 22–32)
Calcium: 8 mg/dL — ABNORMAL LOW (ref 8.9–10.3)
Chloride: 95 mmol/L — ABNORMAL LOW (ref 98–111)
Creatinine, Ser: 1.59 mg/dL — ABNORMAL HIGH (ref 0.61–1.24)
GFR, Estimated: 43 mL/min — ABNORMAL LOW (ref >60)
Glucose, Bld: 191 mg/dL — ABNORMAL HIGH (ref 70–99)
Potassium: 3.7 mmol/L (ref 3.5–5.1)
Sodium: 131 mmol/L — ABNORMAL LOW (ref 135–145)

## 2022-09-08 LAB — ECHOCARDIOGRAM COMPLETE
AR max vel: 0.86 cm2
AV Area VTI: 0.82 cm2
AV Area mean vel: 0.78 cm2
AV Mean grad: 7.6 mmHg
AV Peak grad: 13.6 mmHg
Ao pk vel: 1.85 m/s
Height: 72 in
S' Lateral: 2.8 cm

## 2022-09-08 LAB — MAGNESIUM: Magnesium: 1.9 mg/dL (ref 1.7–2.4)

## 2022-09-08 LAB — CULTURE, BLOOD (ROUTINE X 2): Special Requests: ADEQUATE

## 2022-09-08 NOTE — Progress Notes (Signed)
PROGRESS NOTE    Bradley Sheppard  ZOX:[id number] DOB: 1940/07/27 DOA: 09/04/2022 PCP: [name], MD   Brief Narrative:    Bradley Sheppard is an 82 y.o. male with medical history significant of type 2 diabetes mellitus, hypertension, CKD 3A, diastolic CHF, rectal mass, MALT lymphoma who presents to the emergency department from home via EMS due to several days of onset of cough, chest congestion and foul-smelling urine.  Patient was admitted with acute on chronic diastolic CHF exacerbation with noted BNP elevation.  He was also thought to have possible community-acquired pneumonia and started on Rocephin and azithromycin.  PT recommending SNF and patient and family agreeable.  Assessment & Plan:   Principal Problem:   Acute on chronic diastolic CHF (congestive heart failure) (HCC) Active Problems:   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)   Essential hypertension   MALT lymphoma (HCC)   Hyponatremia   Paroxysmal atrial fibrillation (HCC)   Elevated brain natriuretic peptide (BNP) level   CAP (community acquired pneumonia)   Prolonged QT interval   Hypoalbuminemia due to protein-calorie malnutrition (HCC)   History of CAD (coronary artery disease)  Assessment and Plan:  Acute on chronic diastolic CHF-resolved Chest x-ray showed cardiomegaly with mild pulmonary edema Continue total input/output, daily weights and fluid restriction Discontinue further IV Lasix Continue heart healthy diet      Echocardiogram done on 11/20/2021 showed LVEF of 65 to 70%.  No RWMA.  Moderate central LVH.  G2 DD.  Repeat echocardiogram pending Reds clip score 38% since 5/5 Dose of metolazone 5/5 and continue to monitor output   Elevated BNP BNP 380 (this was 545.6 on 03/02/2022) Continue management as described above   Possible CAP POA Chest x-ray was suggestive of superimposed infection Patient was started on ceftriaxone and azithromycin, we shall continue same at this time with plan  to de-escalate/discontinue based on blood culture, sputum culture, urine Legionella, strep pneumo and procalcitonin Continue Tylenol as needed Continue Mucinex, incentive spirometry, flutter valve    Paroxysmal atrial fibrillation with RVR CHADs2VASC score- at least 4.   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  Patient follows with [name]    DVT prophylaxis:Lovenox Code Status: Full Family Communication: Significant other at bedside 5/4 Disposition Plan:  Status is: Inpatient Remains inpatient appropriate because: Need for IV medications.   Consultants:  None  Procedures:  None  Antimicrobials:  Anti-infectives (From admission, onward)    Start     Dose/Rate Route Frequency Ordered Stop   09/04/22 2300  cefTRIAXone (ROCEPHIN) 1 g in sodium chloride 0.9 % 100 mL IVPB  1 g 200 mL/hr over 30 Minutes Intravenous Every 24 hours 09/04/22 2249     09/04/22 2300  azithromycin (ZITHROMAX) 500 mg in sodium chloride 0.9 % 250 mL IVPB        500 mg 250  mL/hr over 60 Minutes Intravenous Every 24 hours 09/04/22 2249        Subjective: Patient seen and evaluated today with no new acute complaints or concerns. No acute concerns or events noted overnight.  Objective: Vitals:   09/07/22 2002 09/07/22 2126 09/08/22 0401 09/08/22 1215  BP: 118/68 128/71 125/88 124/76  Pulse: 74 80 98 85  Resp: 18 20 19 18   Temp: 97.7 F (36.5 C) 98.8 F (37.1 C) 98.5 F (36.9 C) 98.4 F (36.9 C)  TempSrc: Oral Oral Oral Oral  SpO2: 98% 99% 95% 95%  Weight:   95.7 kg   Height:        Intake/Output Summary (Last 24 hours) at 09/08/2022 1421 Last data filed at 09/08/2022 0800 Gross per 24 hour  Intake 929.27 ml  Output 1350 ml  Net -420.73 ml    Filed Weights   09/06/22 0500 09/07/22 0419 09/08/22 0401  Weight: 94.5 kg 93.3 kg 95.7 kg    Examination:  General exam: Appears calm and comfortable, hard of hearing Respiratory system: Clear to auscultation. Respiratory effort normal. Cardiovascular system: S1 & S2 heard, RRR.  Gastrointestinal system: Abdomen is soft Central nervous system: Alert and awake Extremities: No edema Skin: No significant lesions noted Psychiatry: Flat affect.    Data Reviewed: I have personally reviewed following labs and imaging studies  CBC: Recent Labs  Lab 09/04/22 1616 09/05/22 0250 09/06/22 0447 09/07/22 0520  WBC 5.1 5.0 5.2 6.7  NEUTROABS 3.0  --   --   --   HGB 10.7* 10.3* 10.4* 10.6*  HCT 32.2* 30.7* 31.7* 31.6*  MCV 87.3 87.2 88.3 87.1  PLT PLATELET CLUMPS NOTED ON SMEAR, COUNT APPEARS ADEQUATE 174 182 219    Basic Metabolic Panel: Recent Labs  Lab 09/04/22 1616 09/05/22 0250 09/06/22 0447 09/07/22 0520 09/08/22 0737  NA 129* 131* 133* 132* 131*  K 3.9 3.5 3.8 3.7 3.7  CL 97* 98 100 97* 95*  CO2 25 26 26 27 27   GLUCOSE 79 54* 102* 102* 191*  BUN 42* 42* 47* 50* 54*  CREATININE 1.35* 1.43* 1.50* 1.54* 1.59*  CALCIUM 8.0* 7.9* 7.8* 7.9* 8.0*  MG  --  1.8 1.9 1.9 1.9  PHOS  --  3.5   --   --   --     GFR: Estimated Creatinine Clearance: 41.5 mL/min (A) (by C-G formula based on SCr of 1.59 mg/dL (H)). Liver Function Tests: Recent Labs  Lab 09/04/22 1616 09/05/22 0250  AST 36 35  ALT 27 26  ALKPHOS 83 84  BILITOT 0.5 0.6  PROT 7.4 7.1  ALBUMIN 2.5* 2.4*    No results for input(s): "LIPASE", "AMYLASE" in the last 168 hours. No results for input(s): "AMMONIA" in the last 168 hours. Coagulation Profile: No results for input(s): "INR", "PROTIME" in the last 168 hours. Cardiac Enzymes: No results for input(s): "CKTOTAL", "CKMB", "CKMBINDEX", "TROPONINI" in the last 168 hours. BNP (last 3 results) No results for input(s): "PROBNP" in the last 8760 hours. HbA1C: Recent Labs    09/06/22 1744  HGBA1C 7.5*    CBG: Recent Labs  Lab 09/07/22 2129 09/07/22 2343 09/08/22 0406 09/08/22 0701 09/08/22 1109  GLUCAP 225* 153* 140* 181* 224*    Lipid Profile:  No results for input(s): "CHOL", "HDL", "LDLCALC", "TRIG", "CHOLHDL", "LDLDIRECT" in the last 72 hours. Thyroid Function Tests: No results for input(s): "TSH", "T4TOTAL", "FREET4", "T3FREE", "THYROIDAB" in the last 72 hours. Anemia Panel: No results for input(s): "VITAMINB12", "FOLATE", "FERRITIN", "TIBC", "IRON", "RETICCTPCT" in the last 72 hours. Sepsis Labs: Recent Labs  Lab 09/05/22 0250  PROCALCITON 0.36     Recent Results (from the past 240 hour(s))  Resp panel by RT-PCR (RSV, Flu A&B, Covid) Anterior Nasal Swab     Status: None   Collection Time: 09/04/22  4:14 PM   Specimen: Anterior Nasal Swab  Result Value Ref Range Status   SARS Coronavirus 2 by RT PCR NEGATIVE NEGATIVE Final    Comment: (NOTE) SARS-CoV-2 target nucleic acids are NOT DETECTED.  The SARS-CoV-2 RNA is generally detectable in upper respiratory specimens during the acute phase of infection. The lowest concentration of SARS-CoV-2 viral copies this assay can detect is 138 copies/mL. A negative result does not preclude  SARS-Cov-2 infection and should not be used as the sole basis for treatment or other patient management decisions. A negative result may occur with  improper specimen collection/handling, submission of specimen other than nasopharyngeal swab, presence of viral mutation(s) within the areas targeted by this assay, and inadequate number of viral copies(<138 copies/mL). A negative result must be combined with clinical observations, patient history, and epidemiological information. The expected result is Negative.  Fact Sheet for Patients:  BloggerCourse.com  Fact Sheet for Healthcare Providers:  SeriousBroker.it  This test is no t yet approved or cleared by the [place] FDA and  has been authorized for detection and/or diagnosis of SARS-CoV-2 by FDA under an Emergency Use Authorization (EUA). This EUA will remain  in effect (meaning this test can be used) for the duration of the COVID-19 declaration under Section 564(b)(1) of the Act, 21 U.S.C.section 360bbb-3(b)(1), unless the authorization is terminated  or revoked sooner.       Influenza A by PCR NEGATIVE NEGATIVE Final   Influenza B by PCR NEGATIVE NEGATIVE Final    Comment: (NOTE) The Xpert Xpress SARS-CoV-2/FLU/RSV plus assay is intended as an aid in the diagnosis of influenza from Nasopharyngeal swab specimens and should not be used as a sole basis for treatment. Nasal washings and aspirates are unacceptable for Xpert Xpress SARS-CoV-2/FLU/RSV testing.  Fact Sheet for Patients: BloggerCourse.com  Fact Sheet for Healthcare Providers: SeriousBroker.it  This test is not yet approved or cleared by the [place] FDA and has been authorized for detection and/or diagnosis of SARS-CoV-2 by FDA under an Emergency Use Authorization (EUA). This EUA will remain in effect (meaning this test can be used) for the duration of  the COVID-19 declaration under Section 564(b)(1) of the Act, 21 U.S.C. section 360bbb-3(b)(1), unless the authorization is terminated or revoked.     Resp Syncytial Virus by PCR NEGATIVE NEGATIVE Final    Comment: (NOTE) Fact Sheet for Patients: BloggerCourse.com  Fact Sheet for Healthcare Providers: SeriousBroker.it  This test is not yet approved or cleared by the [place] FDA and has been authorized for detection and/or diagnosis of SARS-CoV-2 by FDA under an Emergency Use Authorization (EUA). This EUA will remain in effect (meaning this test can be used) for the duration of the COVID-19 declaration under Section 564(b)(1) of the Act, 21 U.S.C. section 360bbb-3(b)(1), unless the authorization is terminated or revoked.  Performed at [facility], [address], [place], [place] [address]   Culture, blood (Routine X 2) w Reflex to ID  Panel     Status: None (Preliminary result)   Collection Time: 09/05/22  2:50 AM   Specimen: Right Antecubital; Blood  Result Value Ref Range Status   Specimen Description   Final    RIGHT ANTECUBITAL BOTTLES DRAWN AEROBIC AND ANAEROBIC   Special Requests Blood Culture adequate volume  Final   Culture   Final    NO GROWTH 3 DAYS Performed at [facility], [address] [place], [place] [address]    Report Status PENDING  Incomplete  Culture, blood (Routine X 2) w Reflex to ID Panel     Status: None (Preliminary result)   Collection Time: 09/05/22  2:59 AM   Specimen: Left Antecubital; Blood  Result Value Ref Range Status   Specimen Description   Final    LEFT ANTECUBITAL BOTTLES DRAWN AEROBIC AND ANAEROBIC   Special Requests Blood Culture adequate volume  Final   Culture   Final    NO GROWTH 3 DAYS Performed at [facility], [address], [place], [place] [address]    Report Status PENDING  Incomplete         Radiology Studies: No results found.      Scheduled Meds:   aspirin EC  81 mg Oral Q breakfast   carvedilol  12.5 mg Oral BID WC   enoxaparin (LOVENOX) injection  40 mg Subcutaneous Q24H   feeding supplement  237 mL Oral BID BM   feeding supplement (GLUCERNA SHAKE)  237 mL Oral TID BM   furosemide  40 mg Intravenous BID   insulin aspart  0-5 Units Subcutaneous QHS   insulin aspart  0-9 Units Subcutaneous TID WC   metolazone  5 mg Oral Once per day on Mon Thu   [START ON 09/09/2022] rosuvastatin  5 mg Oral Q Wed   tamsulosin  0.4 mg Oral Daily   Continuous Infusions:  azithromycin Stopped (09/08/22 0036)   cefTRIAXone (ROCEPHIN)  IV 200 mL/hr at 09/08/22 0751     LOS: 4 days    Time spent: 35 minutes    Akiem Urieta D Sherryll Burger, DO [facility]  If 7PM-7AM, please contact night-coverage [email or web address] 09/08/2022, 2:21 PM

## 2022-09-08 NOTE — TOC Progression Note (Signed)
Transition of Care [facility]) - Progression Note    Patient Details  Name: Bradley Sheppard MRN: [id number] Date of Birth: [birth year]  [facility] CM/SW Contact  Villa Herb, [place] Phone Number: 09/08/2022, 3:25 PM  Clinical Narrative:    CSW spoke with pt and wife in room. They would like SNF at D/C. CSW explained that insurance Berkley Harvey is pending and we will continue to follow. CSW reached out to [facility] who state insurance Berkley Harvey is under medical review. TOC to follow.   Expected Discharge Plan: Skilled Nursing Facility Barriers to Discharge: Continued Medical Work up  Expected Discharge Plan and Services In-house Referral: Clinical Social Work   Post Acute Care Choice: Skilled Nursing Facility Living arrangements for the past 2 months: Single Family Home (Lives with spouse of 62 years)                                       Social Determinants of Health (SDOH) Interventions SDOH Screenings   Food Insecurity: No Food Insecurity (09/05/2022)  Housing: Low Risk  (09/05/2022)  Transportation Needs: No Transportation Needs (09/05/2022)  Utilities: Not At Risk (09/05/2022)  Tobacco Use: High Risk (09/04/2022)    Readmission Risk Interventions    09/06/2022   10:32 AM 09/05/2022    5:41 PM 02/04/2022    9:13 AM  Readmission Risk Prevention Plan  Transportation Screening Complete Complete Complete  Medication Review Oceanographer) Complete Complete Complete  PCP or Specialist appointment within 3-5 days of discharge Complete Complete Complete  HRI or Home Care Consult Complete Complete Complete  SW Recovery Care/Counseling Consult  Complete Complete  Palliative Care Screening   Not Applicable  Skilled Nursing Facility   Complete

## 2022-09-09 DIAGNOSIS — I5033 Acute on chronic diastolic (congestive) heart failure: Secondary | ICD-10-CM | POA: Diagnosis not present

## 2022-09-09 LAB — GLUCOSE, CAPILLARY
Glucose-Capillary: 141 mg/dL — ABNORMAL HIGH (ref 70–99)
Glucose-Capillary: 151 mg/dL — ABNORMAL HIGH (ref 70–99)
Glucose-Capillary: 260 mg/dL — ABNORMAL HIGH (ref 70–99)
Glucose-Capillary: 282 mg/dL — ABNORMAL HIGH (ref 70–99)
Glucose-Capillary: 263 mg/dL — ABNORMAL HIGH (ref 70–99)

## 2022-09-09 LAB — CBC
HCT: 33.3 % — ABNORMAL LOW (ref 39.0–52.0)
Hemoglobin: 11 g/dL — ABNORMAL LOW (ref 13.0–17.0)
MCH: 29.3 pg (ref 26.0–34.0)
MCHC: 33 g/dL (ref 30.0–36.0)
MCV: 88.8 fL (ref 80.0–100.0)
Platelets: 177 10*3/uL (ref 150–400)
RBC: 3.75 MIL/uL — ABNORMAL LOW (ref 4.22–5.81)
RDW: 15.8 % — ABNORMAL HIGH (ref 11.5–15.5)
WBC: 8.6 10*3/uL (ref 4.0–10.5)
nRBC: 0 % (ref 0.0–0.2)

## 2022-09-09 LAB — BASIC METABOLIC PANEL
Anion gap: 10 (ref 5–15)
BUN: 53 mg/dL — ABNORMAL HIGH (ref 8–23)
CO2: 27 mmol/L (ref 22–32)
Calcium: 8.1 mg/dL — ABNORMAL LOW (ref 8.9–10.3)
Chloride: 96 mmol/L — ABNORMAL LOW (ref 98–111)
Creatinine, Ser: 1.54 mg/dL — ABNORMAL HIGH (ref 0.61–1.24)
GFR, Estimated: 44 mL/min — ABNORMAL LOW (ref >60)
Glucose, Bld: 149 mg/dL — ABNORMAL HIGH (ref 70–99)
Potassium: 3.9 mmol/L (ref 3.5–5.1)
Sodium: 133 mmol/L — ABNORMAL LOW (ref 135–145)

## 2022-09-09 LAB — CULTURE, BLOOD (ROUTINE X 2): Special Requests: ADEQUATE

## 2022-09-09 LAB — MAGNESIUM: Magnesium: 2 mg/dL (ref 1.7–2.4)

## 2022-09-09 MED ORDER — FUROSEMIDE 40 MG PO TABS
40.0000 mg | ORAL_TABLET | Freq: Every day | ORAL | Status: DC
  Administered 2022-09-09 – 2022-09-10 (×2): 40 mg via ORAL
  Filled 2022-09-09 (×2): qty 1

## 2022-09-09 NOTE — Progress Notes (Signed)
Physical Therapy Treatment Patient Details Name: Bradley Sheppard MRN: [id number] DOB: 1941/04/18 Today's Date: 09/09/2022   History of Present Illness Bradley Sheppard is an 82 y.o. male with medical history significant of type 2 diabetes mellitus, hypertension, CKD 3A, diastolic CHF, rectal mass, MALT lymphoma who presents to the emergency department from home via EMS due to several days of onset of cough, chest congestion and foul-smelling urine.  He had a virtual visit with his PCP who started him on antibiotics (cefuroxime) without much relief of symptoms.  Per wife at bedside, patient has become more fatigued and weaker and has not been eating within the last 2 to 3 days, blood glucose at home was also said to be in the low range.    PT Comments    Patient demonstrates slight improvement for sitting up at bedside using bed rail with HOB raised, able to complete sit to stands with bed raised and limited to a few slow labored unsteady side steps before having to sit due to weakness with buckling of RLE. Patient able to transfer to chair using RW with mechanical lift harness placed in seat.  Patient tolerated sitting up in chair after therapy - nursing staff aware and encouraged to use mechanical lift if necessary for putting patient back to bed.  Patient will benefit from continued skilled physical therapy in hospital and recommended venue below to increase strength, balance, endurance for safe ADLs and gait.     Recommendations for follow up therapy are one component of a multi-disciplinary discharge planning process, led by the attending physician.  Recommendations may be updated based on patient status, additional functional criteria and insurance authorization.  Follow Up Recommendations  Can patient physically be transported by private vehicle: No    Assistance Recommended at Discharge    Patient can return home with the following A lot of help with bathing/dressing/bathroom;A lot of help  with walking and/or transfers;Help with stairs or ramp for entrance;Assistance with cooking/housework   Equipment Recommendations  None recommended by PT    Recommendations for Other Services       Precautions / Restrictions Precautions Precautions: Fall Restrictions Weight Bearing Restrictions: No     Mobility  Bed Mobility Overal bed mobility: Needs Assistance Bed Mobility: Supine to Sit     Supine to sit: HOB elevated, Min assist Sit to supine: Mod assist   General bed mobility comments: increased time, labored movement    Transfers Overall transfer level: Needs assistance Equipment used: Rolling walker (2 wheels) Transfers: Sit to/from Stand, Bed to chair/wheelchair/BSC Sit to Stand: Mod assist, Max assist   Step pivot transfers: Mod assist, Max assist       General transfer comment: required bed raised and right knee blocked for completing sit to stand    Ambulation/Gait Ambulation/Gait assistance: Max assist Gait Distance (Feet): 3 Feet Assistive device: Rolling walker (2 wheels) Gait Pattern/deviations: Decreased step length - right, Decreased step length - left, Decreased stance time - right, Decreased stride length, Antalgic, Knees buckling, Shuffle Gait velocity: slow     General Gait Details: limited to a few slow labored side steps with frequent buckling of RLE and unable to attempt stepping away from bedside due to fall risk   Stairs             Wheelchair Mobility    Modified Rankin (Stroke Patients Only)       Balance Overall balance assessment: Needs assistance Sitting-balance support: Feet supported, No upper extremity supported Sitting balance-Leahy  Scale: Fair Sitting balance - Comments: fair/good seated at EOB   Standing balance support: During functional activity, Bilateral upper extremity supported, Reliant on assistive device for balance Standing balance-Leahy Scale: Poor Standing balance comment: using RW                             Cognition Arousal/Alertness: Awake/alert Behavior During Therapy: WFL for tasks assessed/performed Overall Cognitive Status: Within Functional Limits for tasks assessed                                          Exercises General Exercises - Lower Extremity Ankle Circles/Pumps: Seated, AAROM, Strengthening, Both, 10 reps Long Arc Quad: Seated, AROM, Strengthening, Both, 5 reps Hip Flexion/Marching: Seated, AROM, Strengthening, Both, 5 reps    General Comments        Pertinent Vitals/Pain Pain Assessment Pain Assessment: Faces Faces Pain Scale: Hurts little more Pain Location: L shoulder with P/ROM Pain Descriptors / Indicators: Grimacing Pain Intervention(s): Limited activity within patient's tolerance, Monitored during session, Repositioned    Home Living                          Prior Function            PT Goals (current goals can now be found in the care plan section) Acute Rehab PT Goals Patient Stated Goal: return home with family to assist PT Goal Formulation: With patient Time For Goal Achievement: 09/21/22 Potential to Achieve Goals: Good Progress towards PT goals: Progressing toward goals    Frequency    Min 3X/week      PT Plan Current plan remains appropriate    Co-evaluation PT/OT/SLP Co-Evaluation/Treatment: Yes Reason for Co-Treatment: To address functional/ADL transfers PT goals addressed during session: Mobility/safety with mobility;Balance;Proper use of DME        AM-PAC PT "6 Clicks" Mobility   Outcome Measure  Help needed turning from your back to your side while in a flat bed without using bedrails?: A Lot Help needed moving from lying on your back to sitting on the side of a flat bed without using bedrails?: A Lot Help needed moving to and from a bed to a chair (including a wheelchair)?: A Lot Help needed standing up from a chair using your arms (e.g., wheelchair or bedside  chair)?: A Lot Help needed to walk in hospital room?: A Lot Help needed climbing 3-5 steps with a railing? : Total 6 Click Score: 11    End of Session   Activity Tolerance: Patient tolerated treatment well;Patient limited by fatigue Patient left: in chair;with call bell/phone within reach Nurse Communication: Mobility status PT Visit Diagnosis: Unsteadiness on feet (R26.81);Other abnormalities of gait and mobility (R26.89);Muscle weakness (generalized) (M62.81)     Time: 1610-9604 PT Time Calculation (min) (ACUTE ONLY): 20 min  Charges:  $Therapeutic Activity: 8-22 mins                     3:37 PM, 09/09/22 Ocie Bob, MPT Physical Therapist with [facility] [phone] office [phone] mobile phone

## 2022-09-09 NOTE — Evaluation (Signed)
Occupational Therapy Evaluation Patient Details Name: Bradley Sheppard MRN: [id number] DOB: 1940-05-13 Today's Date: 09/09/2022   History of Present Illness Bradley Sheppard is an 82 y.o. male with medical history significant of type 2 diabetes mellitus, hypertension, CKD 3A, diastolic CHF, rectal mass, MALT lymphoma who presents to the emergency department from home via EMS due to several days of onset of cough, chest congestion and foul-smelling urine.  He had a virtual visit with his PCP who started him on antibiotics (cefuroxime) without much relief of symptoms.  Per wife at bedside, patient has become more fatigued and weaker and has not been eating within the last 2 to 3 days, blood glucose at home was also said to be in the low range.   Clinical Impression   Pt agreeable to OT and PT co-evaluation. Pt assisted much at baseline for ADL's. Today pt demonstrated need for min A for bed mobility with HOB elevated. Mod to max A for transfer to chair with RW. Pt has limited shoulder A/ROM and general weakness. Upper and lower body dressing/bathing requires assist. Pt left in the chair with call bell within reach. Pt will benefit from continued OT in the hospital and recommended venue below to increase strength, balance, and endurance for safe ADL's.         Recommendations for follow up therapy are one component of a multi-disciplinary discharge planning process, led by the attending physician.  Recommendations may be updated based on patient status, additional functional criteria and insurance authorization.   Assistance Recommended at Discharge Intermittent Supervision/Assistance  Patient can return home with the following A lot of help with walking and/or transfers;A lot of help with bathing/dressing/bathroom;Assistance with cooking/housework;Assist for transportation;Help with stairs or ramp for entrance    Functional Status Assessment  Patient has had a recent decline in their functional  status and demonstrates the ability to make significant improvements in function in a reasonable and predictable amount of time.  Equipment Recommendations  None recommended by OT           Precautions / Restrictions Precautions Precautions: Fall Restrictions Weight Bearing Restrictions: No      Mobility Bed Mobility Overal bed mobility: Needs Assistance Bed Mobility: Supine to Sit     Supine to sit: HOB elevated, Min assist     General bed mobility comments: Use of bed rail and elevated HOB; slow labored movement.    Transfers Overall transfer level: Needs assistance Equipment used: Rolling walker (2 wheels) Transfers: Sit to/from Stand, Bed to chair/wheelchair/BSC Sit to Stand: Mod assist, Max assist     Step pivot transfers: Mod assist, Max assist     General transfer comment: PT and OT assisting pt for safety, but pt operating and mod to max level for boost from EOB with extended time and cuing for pivot to the chair. Difficulty mobilizing L LE.      Balance Overall balance assessment: Needs assistance Sitting-balance support: Feet supported, No upper extremity supported Sitting balance-Leahy Scale: Fair Sitting balance - Comments: fair/good seated at EOB   Standing balance support: During functional activity, Bilateral upper extremity supported, Reliant on assistive device for balance Standing balance-Leahy Scale: Poor Standing balance comment: using RW                           ADL either performed or assessed with clinical judgement   ADL Overall ADL's : Needs assistance/impaired     Grooming: Min guard;Sitting;Minimal assistance  Upper Body Bathing: Moderate assistance;Sitting   Lower Body Bathing: Maximal assistance;Sitting/lateral leans   Upper Body Dressing : Moderate assistance;Sitting   Lower Body Dressing: Maximal assistance;Sitting/lateral leans   Toilet Transfer: Moderate assistance;Maximal assistance;Rolling walker (2  wheels);Stand-pivot Statistician Details (indicate cue type and reason): Simulated via EOB to chair transfer. Toileting- Clothing Manipulation and Hygiene: Maximal assistance;Sitting/lateral lean;Bed level       Functional mobility during ADLs: Moderate assistance;Maximal assistance;Rolling walker (2 wheels)       Vision Baseline Vision/History: 1 Wears glasses Ability to See in Adequate Light: 1 Impaired Patient Visual Report: No change from baseline Vision Assessment?: No apparent visual deficits                Pertinent Vitals/Pain Pain Assessment Pain Assessment: Faces Faces Pain Scale: Hurts little more Pain Location: L shoulder with P/ROM Pain Descriptors / Indicators: Grimacing Pain Intervention(s): Limited activity within patient's tolerance, Monitored during session, Repositioned     Hand Dominance Right   Extremity/Trunk Assessment Upper Extremity Assessment Upper Extremity Assessment: RUE deficits/detail;LUE deficits/detail RUE Deficits / Details: Limited to ~ 75% shoulder flexion A/ROM while slightly reclined in bed. Near full P/ROM of shoulder flexion. Generally weak otherwise. LUE Deficits / Details: Limited to ~ 75% shoulder flexion A/ROM while slightly reclined in bed. LImited to ~75% available P/ROM of shoulder flexion as well. Generally weak otherwise.   Lower Extremity Assessment Lower Extremity Assessment: Defer to PT evaluation   Cervical / Trunk Assessment Cervical / Trunk Assessment: Normal   Communication Communication Communication: HOH   Cognition Arousal/Alertness: Awake/alert Behavior During Therapy: WFL for tasks assessed/performed Overall Cognitive Status: Within Functional Limits for tasks assessed                                                        Home Living Family/patient expects to be discharged to:: Private residence Living Arrangements: Spouse/significant other Available Help at Discharge:  Available 24 hours/day;Family Type of Home: House Home Access: Stairs to enter [employer] of Steps: 1   Home Layout: One level     Bathroom Shower/Tub: Chief Strategy Officer: Standard Bathroom Accessibility: No   Home Equipment: Agricultural consultant (2 wheels);Rollator (4 wheels);BSC/3in1;Tub bench;Transport chair   Additional Comments: Per PT note.      Prior Functioning/Environment Prior Level of Function : Needs assist       Physical Assist : Mobility (physical);ADLs (physical) Mobility (physical): Bed mobility;Transfers;Gait;Stairs ADLs (physical): Dressing;Toileting;IADLs;Bathing Mobility Comments: assisted for sitting up in bed, completing sit to stands, and followed with wheelchair by family members when walking short household distances in home (per PT) ADLs Comments: Able to feed and groom; assist for dressing, toileting, and bathing. Assist IADL's.        OT Problem List: Decreased strength;Decreased range of motion;Decreased activity tolerance;Impaired balance (sitting and/or standing)      OT Treatment/Interventions: Self-care/ADL training;Therapeutic exercise;Therapeutic activities;Patient/family education;Balance training    OT Goals(Current goals can be found in the care plan section) Acute Rehab OT Goals Patient Stated Goal: To go home. OT Goal Formulation: With patient Time For Goal Achievement: 09/23/22 Potential to Achieve Goals: Good  OT Frequency: Min 2X/week    Co-evaluation PT/OT/SLP Co-Evaluation/Treatment: Yes Reason for Co-Treatment: To address functional/ADL transfers   OT goals addressed during session: ADL's and self-care  End of Session Equipment Utilized During Treatment: Rolling walker (2 wheels);Gait belt  Activity Tolerance: Patient tolerated treatment well Patient left: in chair;with call bell/phone within reach  OT Visit Diagnosis: Unsteadiness on feet (R26.81);Muscle weakness  (generalized) (M62.81)                Time: 9604-5409 OT Time Calculation (min): 17 min Charges:  OT General Charges $OT Visit: 1 Visit OT Evaluation $OT Eval Low Complexity: 1 Low  Barrie Wale OT, MOT  Danie Chandler 09/09/2022, 9:36 AM

## 2022-09-09 NOTE — TOC Progression Note (Addendum)
Transition of Care [facility]) - Progression Note    Patient Details  Name: Bradley Sheppard MRN: [id number] Date of Birth: 04-18-1941  Transition of Care [facility] CM/SW Contact  Villa Herb, [place] Phone Number: 09/09/2022, 10:15 AM  Clinical Narrative:    CSW spoke to [place] with [facility] to get update on insurance auth. Junious Dresser states she will reach out to MD and see if anything further is needed. TOC to follow.  Addendum 3pm- CSW updated that pts insurance Berkley Harvey has been approved. CSW updated pt and wife of this. CSW confirmed they are agreeable to plan for pt to D/C to [facility] tomorrow. CSW updated [facility] in admissions of plan, they will be able to accept pt tomorrow. TOC to follow.   Expected Discharge Plan: Skilled Nursing Facility Barriers to Discharge: Continued Medical Work up, English as a second language teacher  Expected Discharge Plan and Services In-house Referral: Clinical Social Work   Post Acute Care Choice: Skilled Nursing Facility Living arrangements for the past 2 months: Single Family Home (Lives with spouse of 62 years)                                       Social Determinants of Health (SDOH) Interventions SDOH Screenings   Food Insecurity: No Food Insecurity (09/05/2022)  Housing: Low Risk  (09/05/2022)  Transportation Needs: No Transportation Needs (09/05/2022)  Utilities: Not At Risk (09/05/2022)  Tobacco Use: High Risk (09/04/2022)    Readmission Risk Interventions    09/06/2022   10:32 AM 09/05/2022    5:41 PM 02/04/2022    9:13 AM  Readmission Risk Prevention Plan  Transportation Screening Complete Complete Complete  Medication Review Oceanographer) Complete Complete Complete  PCP or Specialist appointment within 3-5 days of discharge Complete Complete Complete  HRI or Home Care Consult Complete Complete Complete  SW Recovery Care/Counseling Consult  Complete Complete  Palliative Care Screening   Not Applicable  Skilled Nursing Facility   Complete

## 2022-09-09 NOTE — Progress Notes (Addendum)
[facility] PROGRESS NOTE  Bradley Sheppard (DOB: 1940/06/19) GNF:[id number] PCP: [name], MD  Brief Narrative: Bradley Sheppard is an 82 y.o. male with a history of T2DM, HTN, HFpEF, stage IIIa CKD, rectal mass s/p excision, MALT lymphoma who presented to the ED on 09/04/2022 with cough and congestion found to have pulmonary edema, admitted for IV diuresis with improvement, also treated with antibiotics for superimposed pneumonia. Respiratory status has improved, though he remains deconditioned from his prior baseline. SNF rehabilitation is being pursued.   Subjective: Still with cough, congestion, though feels overall much improved. Amenable to repeating rehab effort as is spouse at bedside.  Objective: BP 138/77 (BP Location: Right Arm)   Pulse (!) 106   Temp 97.9 F (36.6 C) (Oral)   Resp 18   Ht 6' (1.829 m)   Wt 96 kg   SpO2 99%   BMI 28.70 kg/m   Gen: Elderly in no distress Pulm: Crackles at bases, no wheezes  CV: RRR, no MRG. +pitting edema GI: Soft, NT, ND, +BS Neuro: Alert and interactive, very HOH. No new focal deficits. Ext: Warm, no deformities. Diffuse weakness. Skin: No acute rashes, lesions or ulcers on visualized skin   Assessment & Plan: Acute on chronic HFpEF, HTN: Chest x-ray showed cardiomegaly with mild pulmonary edema. Echo on 09/05/2022 showed LVEF 55-60%, no RWMA, severe LVH with indeterminate diastolic parameters. RV grossly normal (limited study), IVC dilated and blunted at time of exam. Reds clip score 38% since 5/5 - IV lasix initially given with twice weekly metolazone with improvement. Will now need to restart home lasix. Monitor I/O, daily weights.    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  - Complete 7 day course with parenteral ceftriaxone.  - Blood cultures NG4D, sputum culture sent, no result.  - Continue Tylenol as needed - Continue Mucinex, incentive spirometry, flutter valve    PAF with  RVR:  - Continue coreg - Not felt to be good anticoagulation candidate with rectal bleeding, weakness predisposing to falls. CHA2DS2-VASc score is 5. Followed by Dr. Diona Browner.     Prolonged QT interval: QT 514 ms - Avoid QT prolonging drugs    Hyponatremia: Improving. 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 - Continue fluid restriction and continue lasix. Monitor BMP regularly.    T2DM: HbA1c 7.5%.  - Monitor CBGs   Hypoalbuminemia possibly secondary to moderate protein calorie malnutrition Albumin 2.5, protein supplement will be provided   Rectal mass s/p resection with transanal endoscopic microsurgery on 12/31/2021 by 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   Deconditioning, weakness: Worsening from being able to walk 3 steps to not being able to walk acutely, was in rehab after rectal mass excision and amenable to returning. Discussed with [name] of [facility] who approves short term rehab stay. Anticipate DC 5/9.    CAD: Hx DES to LAD and RCA in 2003. No angina. 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  - Patient follows with Dr. Laury Deep, MD [facility] [email or web address] 09/09/2022, 5:46 PM

## 2022-09-09 NOTE — Plan of Care (Signed)
  Problem: Acute Rehab OT Goals (only OT should resolve) Goal: Pt. Will Perform Grooming Flowsheets (Taken 09/09/2022 [phone]) Pt Will Perform Grooming:  with modified independence  sitting Goal: Pt. Will Perform Upper Body Bathing Flowsheets (Taken 09/09/2022 0938) Pt Will Perform Upper Body Bathing:  with supervision  sitting Goal: Pt. Will Perform Upper Body Dressing Flowsheets (Taken 09/09/2022 0938) Pt Will Perform Upper Body Dressing:  with supervision  sitting Goal: Pt. Will Transfer To Toilet Flowsheets (Taken 09/09/2022 [phone]) Pt Will Transfer to Toilet:  with min assist  stand pivot transfer Goal: Pt/Caregiver Will Perform Home Exercise Program Flowsheets (Taken 09/09/2022 [phone]) Pt/caregiver will Perform Home Exercise Program:  Increased ROM  Increased strength  Both right and left upper extremity  Independently  Tomeeka Plaugher OT, MOT

## 2022-09-09 NOTE — Consult Note (Signed)
[facility] Customer service manager [facility]) Accountable Care Organization (ACO) [facility] Liaison Note  09/09/2022  Bradley Sheppard November 04, 1940 [id number]  Location: [facility] RN Hospital Liaison screened the patient remotely at [facility].  Insurance: [employer]   Bradley Sheppard is a 82 y.o. male who is a Primary Care Patient of Vyas, Dhruv B, MD. The patient was screened for  readmission hospitalization with noted extreme risk score for unplanned readmission risk with 1  IP in 6 months.  The patient was assessed for potential [facility]) Care Management service needs for post hospital transition for care coordination. Review of patient's electronic medical record reveals patient discharge disposition for SNF placement for ongoing rehabilitations. Pt's needs will be managed at the SNF facility post hospital discharge with no needs via [facility] at that time.    [facility] Care Management/Population Health does not replace or interfere with any arrangements made by the Inpatient Transition of Care team.   For questions contact:   Elliot Cousin, RN, BSN [facility] Liaison [place]   [facility]  Population Health Office Hours MTWF 8:00 am to 6 pm off on Thursday [phone] mobile [phone] [Office toll free line][facility] Office Hours are M-F 8:30 - 5 pm 24 hour nurse advise line [phone] Conceirge  [email or web address]

## 2022-09-10 DIAGNOSIS — E871 Hypo-osmolality and hyponatremia: Secondary | ICD-10-CM | POA: Diagnosis not present

## 2022-09-10 DIAGNOSIS — M6281 Muscle weakness (generalized): Secondary | ICD-10-CM | POA: Diagnosis not present

## 2022-09-10 DIAGNOSIS — I700 Atherosclerosis of aorta: Secondary | ICD-10-CM | POA: Diagnosis not present

## 2022-09-10 DIAGNOSIS — I480 Paroxysmal atrial fibrillation: Secondary | ICD-10-CM | POA: Diagnosis not present

## 2022-09-10 DIAGNOSIS — F419 Anxiety disorder, unspecified: Secondary | ICD-10-CM | POA: Diagnosis not present

## 2022-09-10 DIAGNOSIS — N189 Chronic kidney disease, unspecified: Secondary | ICD-10-CM | POA: Diagnosis present

## 2022-09-10 DIAGNOSIS — D375 Neoplasm of uncertain behavior of rectum: Secondary | ICD-10-CM | POA: Diagnosis not present

## 2022-09-10 DIAGNOSIS — J189 Pneumonia, unspecified organism: Secondary | ICD-10-CM | POA: Diagnosis not present

## 2022-09-10 DIAGNOSIS — C884 Extranodal marginal zone B-cell lymphoma of mucosa-associated lymphoid tissue [MALT-lymphoma]: Secondary | ICD-10-CM | POA: Diagnosis present

## 2022-09-10 DIAGNOSIS — E11649 Type 2 diabetes mellitus with hypoglycemia without coma: Secondary | ICD-10-CM | POA: Diagnosis not present

## 2022-09-10 DIAGNOSIS — D472 Monoclonal gammopathy: Secondary | ICD-10-CM | POA: Diagnosis not present

## 2022-09-10 DIAGNOSIS — I2510 Atherosclerotic heart disease of native coronary artery without angina pectoris: Secondary | ICD-10-CM | POA: Diagnosis not present

## 2022-09-10 DIAGNOSIS — E611 Iron deficiency: Secondary | ICD-10-CM | POA: Diagnosis not present

## 2022-09-10 DIAGNOSIS — R1310 Dysphagia, unspecified: Secondary | ICD-10-CM | POA: Diagnosis not present

## 2022-09-10 DIAGNOSIS — I10 Essential (primary) hypertension: Secondary | ICD-10-CM | POA: Diagnosis not present

## 2022-09-10 DIAGNOSIS — Z299 Encounter for prophylactic measures, unspecified: Secondary | ICD-10-CM | POA: Diagnosis not present

## 2022-09-10 DIAGNOSIS — D508 Other iron deficiency anemias: Secondary | ICD-10-CM | POA: Diagnosis not present

## 2022-09-10 DIAGNOSIS — I5033 Acute on chronic diastolic (congestive) heart failure: Secondary | ICD-10-CM | POA: Diagnosis not present

## 2022-09-10 DIAGNOSIS — R41841 Cognitive communication deficit: Secondary | ICD-10-CM | POA: Diagnosis not present

## 2022-09-10 DIAGNOSIS — E46 Unspecified protein-calorie malnutrition: Secondary | ICD-10-CM | POA: Diagnosis not present

## 2022-09-10 DIAGNOSIS — R2689 Other abnormalities of gait and mobility: Secondary | ICD-10-CM | POA: Diagnosis not present

## 2022-09-10 DIAGNOSIS — I5032 Chronic diastolic (congestive) heart failure: Secondary | ICD-10-CM | POA: Diagnosis not present

## 2022-09-10 DIAGNOSIS — N1832 Chronic kidney disease, stage 3b: Secondary | ICD-10-CM | POA: Diagnosis not present

## 2022-09-10 DIAGNOSIS — E1165 Type 2 diabetes mellitus with hyperglycemia: Secondary | ICD-10-CM | POA: Diagnosis not present

## 2022-09-10 DIAGNOSIS — D631 Anemia in chronic kidney disease: Secondary | ICD-10-CM | POA: Diagnosis present

## 2022-09-10 DIAGNOSIS — M25569 Pain in unspecified knee: Secondary | ICD-10-CM | POA: Diagnosis not present

## 2022-09-10 DIAGNOSIS — N1831 Chronic kidney disease, stage 3a: Secondary | ICD-10-CM | POA: Diagnosis not present

## 2022-09-10 LAB — GLUCOSE, CAPILLARY
Glucose-Capillary: 139 mg/dL — ABNORMAL HIGH (ref 70–99)
Glucose-Capillary: 134 mg/dL — ABNORMAL HIGH (ref 70–99)

## 2022-09-10 LAB — CULTURE, BLOOD (ROUTINE X 2): Culture: NO GROWTH

## 2022-09-10 MED ORDER — OXYCODONE HCL 5 MG PO TABS: 2.5000 mg | ORAL_TABLET | Freq: Four times a day (QID) | ORAL | 0 refills | Status: AC | PRN

## 2022-09-10 MED ORDER — CEFTRIAXONE SODIUM 1 G IJ SOLR: 1.0000 g | INTRAMUSCULAR | 0 refills | Status: AC

## 2022-09-10 MED ORDER — MELATONIN 3 MG PO TABS: 6.0000 mg | ORAL_TABLET | Freq: Every day | ORAL | Status: DC

## 2022-09-10 MED ORDER — LORAZEPAM 0.5 MG PO TABS: 0.5000 mg | ORAL_TABLET | Freq: Every evening | ORAL | 0 refills | Status: AC | PRN

## 2022-09-10 MED ORDER — INSULIN GLARGINE 100 UNIT/ML ~~LOC~~ SOLN: 10.0000 [IU] | Freq: Every day | SUBCUTANEOUS | Status: DC

## 2022-09-10 NOTE — Discharge Summary (Signed)
Physician Discharge Summary   Patient: Bradley Sheppard MRN: [id number] DOB: 01/10/1941  Admit date:     09/04/2022  Discharge date: 09/10/22  Discharge Physician: Tyrone Nine   PCP: [name], MD   Recommendations at discharge:  Follow up with PCP after SNF discharge.  Monitor volume status, weights, I/O, etc. Continue daily lasix, twice weekly metolazone and routine cardiology follow up (Dr. Diona Browner).  Continue routine follow up with oncology, [name].  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 male with a history of T2DM, HTN, HFpEF, stage IIIa CKD, rectal mass s/p excision, MALT lymphoma who presented to the ED on 09/04/2022 with cough and congestion found to have pulmonary edema, admitted for IV diuresis with improvement, also treated with antibiotics for superimposed pneumonia. Respiratory status has improved, though he remains deconditioned from his prior baseline. SNF rehabilitation is being pursued.   Assessment and Plan: Acute on chronic HFpEF, HTN: Chest x-ray showed cardiomegaly with mild pulmonary edema. Echo on 09/05/2022 showed LVEF 55-60%, no RWMA, severe LVH with indeterminate diastolic parameters. RV grossly normal (limited study), IVC dilated and blunted at time of exam. Reds clip score 38% since 5/5 - IV lasix initially given with twice weekly metolazone with improvement. Now stable back on home lasix. Weight at discharge 91.8kg (down from 96kg earlier in hospital stay).  Monitor I/O, daily weights.    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  - Complete 7 day course with parenteral ceftriaxone.  - Blood cultures NG5D, sputum culture sent, no result.  - Continue incentive spirometry, flutter valve    PAF with RVR:  - Continue coreg - Not felt to be good anticoagulation candidate with rectal bleeding, weakness predisposing to falls. CHA2DS2-VASc score is 5. Followed by Dr. Diona Browner.     Prolonged QT interval: QT 514 ms - Avoid QT prolonging drugs    Hyponatremia: Improving. 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 - Continue fluid restriction and continue lasix. Monitor BMP regularly.    T2DM: HbA1c 7.5%.  - Monitor CBGs. Simplified lantus to 10u daily, continue [facility].   Hypoalbuminemia possibly secondary to moderate protein calorie malnutrition Albumin 2.5, protein supplement will be provided   Rectal mass s/p resection with transanal endoscopic microsurgery on 12/31/2021 by 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    Deconditioning, weakness: Worsening from being able to walk 3 steps to not being able to walk acutely, was in rehab after rectal mass excision and amenable to returning. Discussed with [name] of [facility] who approves short term rehab stay. Anticipate DC 5/9.    CAD: Hx DES to LAD and RCA in 2003. No angina. 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  - Patient follows with [name]   Consultants: None Procedures performed: None  Disposition: Skilled nursing facility Diet recommendation:  Cardiac and Carb modified diet DISCHARGE MEDICATION: Allergies as of  09/10/2022       Reactions   Nsaids Other (See Comments)   On blood thinners with h/o IDA/bleeding   Prednisone Other (See Comments)   "makes him feel faint", hyperglycemic          Medication List     STOP taking these medications    cefUROXime 500 MG tablet Commonly known as: CEFTIN       TAKE these medications    acetaminophen 500 MG tablet Commonly known as: TYLENOL Take 1,000 mg by mouth every 6 (six) hours as needed for moderate pain.   ascorbic acid 500 MG tablet Commonly known as: VITAMIN C Take 500 mg by mouth at bedtime.   aspirin EC 81 MG tablet Take 1 tablet (81 mg total) by mouth daily with breakfast. Swallow whole.   carvedilol 12.5 MG tablet Commonly known as: COREG Take 1 tablet (12.5 mg total) by mouth 2 (two) times daily with a meal.   cefTRIAXone 1 g injection Commonly known as: ROCEPHIN Inject 1 g into the muscle daily for 1 dose. on 5/9 at 23:00   cyanocobalamin 500 MCG tablet Commonly known as: VITAMIN B12 Take 500 mcg by mouth daily.   feeding supplement Liqd Take 237 mLs by mouth 2 (two) times daily between meals.   FIBER-LAX PO Take 1 capsule by mouth daily.   furosemide 40 MG tablet Commonly known as: LASIX Take 1 tablet (40 mg total) by mouth daily.   glipiZIDE 10 MG 24 hr tablet Commonly known as: GLUCOTROL XL Take 1 tablet (10 mg total) by mouth daily with breakfast. What changed: when to take this   insulin glargine 100 UNIT/ML injection Commonly known as: LANTUS Inject 0.1 mLs (10 Units total) into the skin daily. What changed:  how much to take additional instructions   LORazepam 0.5 MG tablet Commonly known as: ATIVAN Take 1 tablet (0.5 mg total) by mouth at bedtime as needed for anxiety or sleep. What changed:  when to take this reasons to take this   metolazone 5 MG tablet Commonly known as: ZAROXOLYN Take 5 mg by mouth 2 (two) times a week.   nitroGLYCERIN 0.4 MG SL tablet Commonly known as: NITROSTAT Place 1 tablet (0.4 mg total) under the tongue every 5 (five) minutes as needed.   oxyCODONE 5 MG immediate release tablet Commonly known as: Oxy IR/ROXICODONE Take 0.5-1 tablets  (2.5-5 mg total) by mouth every 6 (six) hours as needed for severe pain. What changed: reasons to take this   polyethylene glycol 17 g packet Commonly known as: MIRALAX / GLYCOLAX Take 17 g by mouth 2 (two) times daily as needed for mild constipation.   potassium chloride 10 MEQ tablet Commonly known as: KLOR-CON Take 20 mEq by mouth daily as needed. Take with Metalozone   rOPINIRole 0.25 MG tablet Commonly known as: REQUIP Take 0.25 mg by mouth at bedtime.   rosuvastatin 5 MG tablet Commonly known as: CRESTOR Take 5 mg by mouth every Wednesday.   tamsulosin 0.4 MG Caps capsule Commonly known as: Flomax Take 1 capsule (0.4 mg total) by mouth daily.   Zinc Oxide 40 % Pste Apply 1 Application topically as needed. Apply to buttock and sacrum if silicone foam is not effective due to incontinence        Follow-up Information     Vyas, Dhruv B, MD Follow up.   Specialty: Internal Medicine Contact information: [address] [place] [address] [phone]  Doreatha Massed, MD Follow up.   Specialty: Hematology Contact information: [address] [place] [address] [phone]         [name], MD .   Specialty: Cardiology Contact information: [address] [place] [address] [phone]                Discharge Exam: Filed Weights   09/08/22 0401 09/09/22 0339 09/10/22 0336  Weight: 95.7 kg 96 kg 91.8 kg  BP (!) 144/89 (BP Location: Right Arm)   Pulse (!) 46   Temp 98.4 F (36.9 C)   Resp 20   Ht 6' (1.829 m)   Wt 91.8 kg   SpO2 95%   BMI 27.45 kg/m   Elderly male in no distress, HOH and diffusely weak but nonfocal Clear lung sounds, nonlabored on room air  Condition at discharge: stable  The results of significant diagnostics from this hospitalization (including imaging, microbiology, ancillary and laboratory) are listed below for reference.   Imaging Studies: ECHOCARDIOGRAM COMPLETE  Result Date:  09/08/2022    ECHOCARDIOGRAM REPORT   Patient Name:   SYAIRE ANGLADE Date of Exam: 09/05/2022 Medical Rec #:  [id number]        Height:       72.0 in Accession #:    [id number]       Weight:       205.0 lb Date of Birth:  Nov 11, 1940        BSA:          2.153 m Patient Age:    82 years         BP:           107/67 mmHg Patient Gender: M                HR:           80 bpm. Exam Location:  Inpatient Procedure: 2D Echo, Color Doppler and Cardiac Doppler Indications:    acute diastolic chf  History:        Patient has prior history of Echocardiogram examinations, most                 recent 11/24/2021. CHF, CAD, chronic kidney disease, Aortic Valve                 Disease, Arrythmias:Atrial Fibrillation; Risk                 Factors:Hypertension and Dyslipidemia.  Sonographer:    Delcie Roch RDCS Referring Phys: [id number] OLADAPO ADEFESO  Sonographer Comments: Image acquisition challenging due to respiratory motion. IMPRESSIONS  1. Left ventricular ejection fraction, by estimation, is 55 to 60%. The left ventricle has normal function. The left ventricle has no regional wall motion abnormalities. There is severe left ventricular hypertrophy. Left ventricular diastolic parameters  are indeterminate.  2. RV not well visualized. Grossly appears normal in size and function. . Right ventricular systolic function was not well visualized. The right ventricular size is not well visualized. Tricuspid regurgitation signal is inadequate for assessing PA pressure.  3. A small pericardial effusion is present. The pericardial effusion is circumferential.  4. The mitral valve is normal in structure. No evidence of mitral valve regurgitation. No evidence of mitral stenosis.  5. LVOT and AV Dopplers are technically limited, likely underestimated values based on angle of acquisition. Consider limited study to reevaluate aortic valve, appears to be at least some degree of stenosis. . The aortic valve is  tricuspid. There is moderate  calcification of the aortic valve. There is moderate thickening of the aortic valve. Aortic valve regurgitation is not visualized.  6. The inferior vena cava is dilated in size with <50% respiratory variability, suggesting right atrial pressure of 15 mmHg. FINDINGS  Left Ventricle: Left ventricular ejection fraction, by estimation, is 55 to 60%. The left ventricle has normal function. The left ventricle has no regional wall motion abnormalities. The left ventricular internal cavity size was normal in size. There is  severe left ventricular hypertrophy. Left ventricular diastolic parameters are indeterminate. Right Ventricle: RV not well visualized. Grossly appears normal in size and function. The right ventricular size is not well visualized. Right vetricular wall thickness was not well visualized. Right ventricular systolic function was not well visualized.  Tricuspid regurgitation signal is inadequate for assessing PA pressure. Left Atrium: Left atrial size was not well visualized. Right Atrium: Right atrial size was not well visualized. Pericardium: A small pericardial effusion is present. The pericardial effusion is circumferential. Mitral Valve: The mitral valve is normal in structure. No evidence of mitral valve regurgitation. No evidence of mitral valve stenosis. Tricuspid Valve: The tricuspid valve is normal in structure. Tricuspid valve regurgitation is not demonstrated. No evidence of tricuspid stenosis. Aortic Valve: LVOT and AV Dopplers are technically limited, likely underestimated values based on angle of acquisition. Consider limited study to reevaluate aortic valve, appears to be at least some degree of stenosis. The aortic valve is tricuspid. There is moderate calcification of the aortic valve. There is moderate thickening of the aortic valve. There is moderate aortic valve annular calcification. Aortic valve regurgitation is not visualized. Aortic valve mean gradient measures 7.6 mmHg. Aortic valve  peak gradient measures 13.6 mmHg. Aortic valve area, by VTI measures 0.82 cm. Pulmonic Valve: The pulmonic valve was not well visualized. Pulmonic valve regurgitation is not visualized. No evidence of pulmonic stenosis. Aorta: The aortic root is normal in size and structure. Venous: The inferior vena cava is dilated in size with less than 50% respiratory variability, suggesting right atrial pressure of 15 mmHg. IAS/Shunts: No atrial level shunt detected by color flow Doppler.  LEFT VENTRICLE PLAX 2D LVIDd:         3.80 cm LVIDs:         2.80 cm LV PW:         1.60 cm LV IVS:        1.60 cm LVOT diam:     1.80 cm LV SV:         25 LV SV Index:   12 LVOT Area:     2.54 cm  RIGHT VENTRICLE            IVC RV S prime:     9.57 cm/s  IVC diam: 2.50 cm TAPSE (M-mode): 1.5 cm LEFT ATRIUM             Index        RIGHT ATRIUM           Index LA diam:        3.90 cm 1.81 cm/m   RA Area:     22.70 cm LA Vol (A2C):   75.1 ml 34.88 ml/m  RA Volume:   68.40 ml  31.77 ml/m LA Vol (A4C):   82.4 ml 38.27 ml/m LA Biplane Vol: 86.0 ml 39.94 ml/m  AORTIC VALVE AV Area (Vmax):    0.86 cm AV Area (Vmean):   0.78 cm AV Area (VTI):  0.82 cm AV Vmax:           184.60 cm/s AV Vmean:          128.489 cm/s AV VTI:            0.309 m AV Peak Grad:      13.6 mmHg AV Mean Grad:      7.6 mmHg LVOT Vmax:         62.02 cm/s LVOT Vmean:        39.600 cm/s LVOT VTI:          0.100 m LVOT/AV VTI ratio: 0.32  AORTA Ao Root diam: 3.30 cm  SHUNTS Systemic VTI:  0.10 m Systemic Diam: 1.80 cm Dina Rich MD Electronically signed by Dina Rich MD Signature Date/Time: 09/08/2022/3:00:11 PM    Final    DG Chest Port 1 View  Result Date: 09/04/2022 CLINICAL DATA:  Shortness of breath EXAM: PORTABLE CHEST 1 VIEW COMPARISON:  CXR 01/11/22 FINDINGS: Cardiomegaly. No pleural effusion. No pneumothorax. There are prominent bilateral interstitial opacities which likely represent a combination of patient's known fibrotic lung disease and mild  pulmonary edema. There may be a focal retrocardiac opacity, which could represent infection. No radiographically apparent displaced rib fractures. Visualized upper abdomen is unremarkable. Vertebral body heights are maintained. IMPRESSION: 1. Cardiomegaly with mild pulmonary edema. 2. Findings of fibrotic lung disease with a more focal retrocardiac opacity, which could represent superimposed infection. Electronically Signed   By: Lorenza Cambridge M.D.   On: 09/04/2022 16:53   CT CHEST HIGH RESOLUTION  Result Date: 08/23/2022 CLINICAL DATA:  Follow-up pleural effusion, history of lymphoma * Tracking Code: BO * EXAM: CT CHEST WITHOUT CONTRAST TECHNIQUE: Multidetector CT imaging of the chest was performed following the standard protocol without intravenous contrast. High resolution imaging of the lungs, as well as inspiratory and expiratory imaging, was performed. RADIATION DOSE REDUCTION: This exam was performed according to the departmental dose-optimization program which includes automated exposure control, adjustment of the mA and/or kV according to patient size and/or use of iterative reconstruction technique. COMPARISON:  11/29/2021 FINDINGS: Cardiovascular: Aortic atherosclerosis. Cardiomegaly. Three-vessel coronary artery calcifications. Unchanged small pericardial effusion. Mediastinum/Nodes: No enlarged mediastinal, hilar, or axillary lymph nodes. Thyroid gland, trachea, and esophagus demonstrate no significant findings. Lungs/Pleura: Similar moderate left, trace right pleural effusions and associated atelectasis or consolidation. Unchanged mild pulmonary fibrosis in a pattern with apical to basal gradient, featuring irregular peripheral interstitial opacity, septal thickening, traction bronchiectasis, and small areas of subpleural bronchiolectasis at the lung bases, with extensive associated finely calcified centrilobular and tree-in-bud nodularity at the lung bases, in keeping with dendriform pulmonary  ossification. Lobular air trapping on expiratory phase imaging. Upper Abdomen: No acute abnormality. Musculoskeletal: No chest wall abnormality. No acute osseous findings. IMPRESSION: 1. Similar moderate left, trace right pleural effusions and associated atelectasis or consolidation. 2. Unchanged mild pulmonary fibrosis in a pattern with apical to basal gradient, featuring irregular peripheral interstitial opacity, septal thickening, traction bronchiectasis, and small areas of subpleural bronchiolectasis at the lung bases. Findings are categorized as probable UIP per consensus guidelines: Diagnosis of Idiopathic Pulmonary Fibrosis: An Official ATS/ERS/JRS/ALAT Clinical Practice Guideline. Am Rosezetta Schlatter Crit Care Med Vol 198, Iss 5, [phone], Jan 02 2017. 3. Extensive finely calcified centrilobular and tree-in-bud nodularity in the lung bases, in keeping with dendriform pulmonary ossification. 4. Lobular air trapping on expiratory phase imaging, suggestive of small airways disease. 5. Cardiomegaly and coronary artery disease. Aortic Atherosclerosis (ICD10-I70.0). Electronically Signed   By: Tobey Grim  Jayme Cloud M.D.   On: 08/23/2022 21:47    Microbiology: Results for orders placed or performed during the hospital encounter of 09/04/22  Resp panel by RT-PCR (RSV, Flu A&B, Covid) Anterior Nasal Swab     Status: None   Collection Time: 09/04/22  4:14 PM   Specimen: Anterior Nasal Swab  Result Value Ref Range Status   SARS Coronavirus 2 by RT PCR NEGATIVE NEGATIVE Final    Comment: (NOTE) SARS-CoV-2 target nucleic acids are NOT DETECTED.  The SARS-CoV-2 RNA is generally detectable in upper respiratory specimens during the acute phase of infection. The lowest concentration of SARS-CoV-2 viral copies this assay can detect is 138 copies/mL. A negative result does not preclude SARS-Cov-2 infection and should not be used as the sole basis for treatment or other patient management decisions. A negative result may occur  with  improper specimen collection/handling, submission of specimen other than nasopharyngeal swab, presence of viral mutation(s) within the areas targeted by this assay, and inadequate number of viral copies(<138 copies/mL). A negative result must be combined with clinical observations, patient history, and epidemiological information. The expected result is Negative.  Fact Sheet for Patients:  BloggerCourse.com  Fact Sheet for Healthcare Providers:  SeriousBroker.it  This test is no t yet approved or cleared by the [place] FDA and  has been authorized for detection and/or diagnosis of SARS-CoV-2 by FDA under an Emergency Use Authorization (EUA). This EUA will remain  in effect (meaning this test can be used) for the duration of the COVID-19 declaration under Section 564(b)(1) of the Act, 21 U.S.C.section 360bbb-3(b)(1), unless the authorization is terminated  or revoked sooner.       Influenza A by PCR NEGATIVE NEGATIVE Final   Influenza B by PCR NEGATIVE NEGATIVE Final    Comment: (NOTE) The Xpert Xpress SARS-CoV-2/FLU/RSV plus assay is intended as an aid in the diagnosis of influenza from Nasopharyngeal swab specimens and should not be used as a sole basis for treatment. Nasal washings and aspirates are unacceptable for Xpert Xpress SARS-CoV-2/FLU/RSV testing.  Fact Sheet for Patients: BloggerCourse.com  Fact Sheet for Healthcare Providers: SeriousBroker.it  This test is not yet approved or cleared by the [place] FDA and has been authorized for detection and/or diagnosis of SARS-CoV-2 by FDA under an Emergency Use Authorization (EUA). This EUA will remain in effect (meaning this test can be used) for the duration of the COVID-19 declaration under Section 564(b)(1) of the Act, 21 U.S.C. section 360bbb-3(b)(1), unless the authorization is terminated  or revoked.     Resp Syncytial Virus by PCR NEGATIVE NEGATIVE Final    Comment: (NOTE) Fact Sheet for Patients: BloggerCourse.com  Fact Sheet for Healthcare Providers: SeriousBroker.it  This test is not yet approved or cleared by the [place] FDA and has been authorized for detection and/or diagnosis of SARS-CoV-2 by FDA under an Emergency Use Authorization (EUA). This EUA will remain in effect (meaning this test can be used) for the duration of the COVID-19 declaration under Section 564(b)(1) of the Act, 21 U.S.C. section 360bbb-3(b)(1), unless the authorization is terminated or revoked.  Performed at [facility], [address] [place], [place] [address]   Culture, blood (Routine X 2) w Reflex to ID Panel     Status: None   Collection Time: 09/05/22  2:50 AM   Specimen: Right Antecubital; Blood  Result Value Ref Range Status   Specimen Description   Final    RIGHT ANTECUBITAL BOTTLES DRAWN AEROBIC AND ANAEROBIC   Special Requests  Blood Culture adequate volume  Final   Culture   Final    NO GROWTH 5 DAYS Performed at [facility], [address], [place], [place] [address]    Report Status 09/10/2022 FINAL  Final  Culture, blood (Routine X 2) w Reflex to ID Panel     Status: None   Collection Time: 09/05/22  2:59 AM   Specimen: Left Antecubital; Blood  Result Value Ref Range Status   Specimen Description   Final    LEFT ANTECUBITAL BOTTLES DRAWN AEROBIC AND ANAEROBIC   Special Requests Blood Culture adequate volume  Final   Culture   Final    NO GROWTH 5 DAYS Performed at [facility], [address], [place], [place] [address]    Report Status 09/10/2022 FINAL  Final    Labs: CBC: Recent Labs  Lab 09/04/22 1616 09/05/22 0250 09/06/22 0447 09/07/22 0520 09/09/22 0441  WBC 5.1 5.0 5.2 6.7 8.6  NEUTROABS 3.0  --   --   --   --   HGB 10.7* 10.3* 10.4* 10.6* 11.0*  HCT 32.2* 30.7* 31.7* 31.6* 33.3*  MCV  87.3 87.2 88.3 87.1 88.8  PLT PLATELET CLUMPS NOTED ON SMEAR, COUNT APPEARS ADEQUATE 174 182 219 177   Basic Metabolic Panel: Recent Labs  Lab 09/05/22 0250 09/06/22 0447 09/07/22 0520 09/08/22 0737 09/09/22 0441  NA 131* 133* 132* 131* 133*  K 3.5 3.8 3.7 3.7 3.9  CL 98 100 97* 95* 96*  CO2 26 26 27 27 27   GLUCOSE 54* 102* 102* 191* 149*  BUN 42* 47* 50* 54* 53*  CREATININE 1.43* 1.50* 1.54* 1.59* 1.54*  CALCIUM 7.9* 7.8* 7.9* 8.0* 8.1*  MG 1.8 1.9 1.9 1.9 2.0  PHOS 3.5  --   --   --   --    Liver Function Tests: Recent Labs  Lab 09/04/22 1616 09/05/22 0250  AST 36 35  ALT 27 26  ALKPHOS 83 84  BILITOT 0.5 0.6  PROT 7.4 7.1  ALBUMIN 2.5* 2.4*   CBG: Recent Labs  Lab 09/09/22 0742 09/09/22 1113 09/09/22 1606 09/09/22 2110 09/10/22 0348  GLUCAP 151* 260* 282* 263* 139*    Discharge time spent: greater than 30 minutes.  Signed: Tyrone Nine, MD [facility] 09/10/2022

## 2022-09-10 NOTE — TOC Transition Note (Signed)
Transition of Care [facility]) - CM/SW Discharge Note   Patient Details  Name: Bradley Sheppard MRN: [id number] Date of Birth: [birth year]  [facility]) CM/SW Contact:  Villa Herb, LCSWA Phone Number: 09/10/2022, 10:15 AM   Clinical Narrative:    CSW updated pt is medically ready for D/C. CSW updated Destiny with [facility], they are ready to accept pt today. CSW updated RN with numbers for room and report. CSW sent med necessity to the floor for RN. EMS has been called for transport. CSW updated pts wife of plan for D/C to SNF today. TOC signing off.   Final next level of care: Skilled Nursing Facility Barriers to Discharge: Barriers Resolved   Patient Goals and CMS Choice CMS Medicare.gov Compare Post Acute Care list provided to:: Patient Choice offered to / list presented to : Patient, Spouse  Discharge Placement                  Patient to be transferred to facility by: EMS Name of family member notified: Malachi Bonds Patient and family notified of of transfer: 09/10/22  Discharge Plan and Services Additional resources added to the After Visit Summary for   In-house Referral: Clinical Social Work   Post Acute Care Choice: Skilled Nursing Facility                               Social Determinants of Health (SDOH) Interventions SDOH Screenings   Food Insecurity: No Food Insecurity (09/05/2022)  Housing: Low Risk  (09/05/2022)  Transportation Needs: No Transportation Needs (09/05/2022)  Utilities: Not At Risk (09/05/2022)  Tobacco Use: High Risk (09/04/2022)     Readmission Risk Interventions    09/06/2022   10:32 AM 09/05/2022    5:41 PM 02/04/2022    9:13 AM  Readmission Risk Prevention Plan  Transportation Screening Complete Complete Complete  Medication Review [employer]) Complete Complete Complete  PCP or Specialist appointment within 3-5 days of discharge Complete Complete Complete  HRI or Home Care Consult Complete Complete Complete  SW Recovery  Care/Counseling Consult  Complete Complete  Palliative Care Screening   Not Applicable  Skilled Nursing Facility   Complete

## 2022-09-10 NOTE — Progress Notes (Signed)
Nsg Discharge Note  Admit Date:  09/04/2022 Discharge date: 09/10/2022   MOXON BEHNEY to be D/C'd Home per MD order.  AVS completed.  Copy for chart, and copy for patient signed, and dated. Patient/caregiver able to verbalize understanding.  Discharge Medication: Allergies as of 09/10/2022       Reactions   Nsaids Other (See Comments)   On blood thinners with h/o IDA/bleeding   Prednisone Other (See Comments)   "makes him feel faint", hyperglycemic         Medication List     STOP taking these medications    cefUROXime 500 MG tablet Commonly known as: CEFTIN       TAKE these medications    acetaminophen 500 MG tablet Commonly known as: TYLENOL Take 1,000 mg by mouth every 6 (six) hours as needed for moderate pain.   ascorbic acid 500 MG tablet Commonly known as: VITAMIN C Take 500 mg by mouth at bedtime.   aspirin EC 81 MG tablet Take 1 tablet (81 mg total) by mouth daily with breakfast. Swallow whole.   carvedilol 12.5 MG tablet Commonly known as: COREG Take 1 tablet (12.5 mg total) by mouth 2 (two) times daily with a meal.   cefTRIAXone 1 g injection Commonly known as: ROCEPHIN Inject 1 g into the muscle daily for 1 dose. on 5/9 at 23:00   cyanocobalamin 500 MCG tablet Commonly known as: VITAMIN B12 Take 500 mcg by mouth daily.   feeding supplement Liqd Take 237 mLs by mouth 2 (two) times daily between meals.   FIBER-LAX PO Take 1 capsule by mouth daily.   furosemide 40 MG tablet Commonly known as: LASIX Take 1 tablet (40 mg total) by mouth daily.   glipiZIDE 10 MG 24 hr tablet Commonly known as: GLUCOTROL XL Take 1 tablet (10 mg total) by mouth daily with breakfast. What changed: when to take this   insulin glargine 100 UNIT/ML injection Commonly known as: LANTUS Inject 0.1 mLs (10 Units total) into the skin daily. What changed:  how much to take additional instructions   LORazepam 0.5 MG tablet Commonly known as: ATIVAN Take 1 tablet  (0.5 mg total) by mouth at bedtime as needed for anxiety or sleep. What changed:  when to take this reasons to take this   metolazone 5 MG tablet Commonly known as: ZAROXOLYN Take 5 mg by mouth 2 (two) times a week.   nitroGLYCERIN 0.4 MG SL tablet Commonly known as: NITROSTAT Place 1 tablet (0.4 mg total) under the tongue every 5 (five) minutes as needed.   oxyCODONE 5 MG immediate release tablet Commonly known as: Oxy IR/ROXICODONE Take 0.5-1 tablets (2.5-5 mg total) by mouth every 6 (six) hours as needed for severe pain. What changed: reasons to take this   polyethylene glycol 17 g packet Commonly known as: MIRALAX / GLYCOLAX Take 17 g by mouth 2 (two) times daily as needed for mild constipation.   potassium chloride 10 MEQ tablet Commonly known as: KLOR-CON Take 20 mEq by mouth daily as needed. Take with Metalozone   rOPINIRole 0.25 MG tablet Commonly known as: REQUIP Take 0.25 mg by mouth at bedtime.   rosuvastatin 5 MG tablet Commonly known as: CRESTOR Take 5 mg by mouth every Wednesday.   tamsulosin 0.4 MG Caps capsule Commonly known as: Flomax Take 1 capsule (0.4 mg total) by mouth daily.   Zinc Oxide 40 % Pste Apply 1 Application topically as needed. Apply to buttock and sacrum if silicone foam  is not effective due to incontinence        Discharge Assessment: Vitals:   09/09/22 2112 09/10/22 0336  BP: 114/68 (!) 144/89  Pulse: 90 (!) 46  Resp: 20   Temp: 99.1 F (37.3 C) 98.4 F (36.9 C)  SpO2: 99% 95%   Skin clean, dry and intact without evidence of skin break down, no evidence of skin tears noted. IV catheter discontinued intact. Site without signs and symptoms of complications - no redness or edema noted at insertion site, patient denies c/o pain - only slight tenderness at site.  Dressing with slight pressure applied.  D/c Instructions-Education: Discharge instructions given to patient/family with verbalized understanding. D/c education  completed with patient/family including follow up instructions, medication list, d/c activities limitations if indicated, with other d/c instructions as indicated by MD - patient able to verbalize understanding, all questions fully answered. Patient instructed to return to ED, call 911, or call MD for any changes in condition.  Patient escorted via WC, and D/C home via private auto.  Demetrio Lapping, LPN  05/09/1094 04:54 AM

## 2022-09-10 NOTE — Progress Notes (Signed)
Report Given To beverly at [facility]

## 2022-09-11 DIAGNOSIS — I700 Atherosclerosis of aorta: Secondary | ICD-10-CM | POA: Diagnosis not present

## 2022-09-11 DIAGNOSIS — C884 Extranodal marginal zone B-cell lymphoma of mucosa-associated lymphoid tissue [MALT-lymphoma]: Secondary | ICD-10-CM | POA: Diagnosis not present

## 2022-09-11 DIAGNOSIS — E1165 Type 2 diabetes mellitus with hyperglycemia: Secondary | ICD-10-CM | POA: Diagnosis not present

## 2022-09-11 DIAGNOSIS — I5032 Chronic diastolic (congestive) heart failure: Secondary | ICD-10-CM | POA: Diagnosis not present

## 2022-09-15 DIAGNOSIS — E1165 Type 2 diabetes mellitus with hyperglycemia: Secondary | ICD-10-CM | POA: Diagnosis not present

## 2022-09-15 DIAGNOSIS — I5032 Chronic diastolic (congestive) heart failure: Secondary | ICD-10-CM | POA: Diagnosis not present

## 2022-09-15 DIAGNOSIS — Z299 Encounter for prophylactic measures, unspecified: Secondary | ICD-10-CM | POA: Diagnosis not present

## 2022-09-16 ENCOUNTER — Inpatient Hospital Stay: Payer: HMO

## 2022-09-22 DIAGNOSIS — Z299 Encounter for prophylactic measures, unspecified: Secondary | ICD-10-CM | POA: Diagnosis not present

## 2022-09-22 DIAGNOSIS — M25569 Pain in unspecified knee: Secondary | ICD-10-CM | POA: Diagnosis not present

## 2022-09-22 DIAGNOSIS — F419 Anxiety disorder, unspecified: Secondary | ICD-10-CM | POA: Diagnosis not present

## 2022-09-23 ENCOUNTER — Inpatient Hospital Stay: Payer: HMO | Attending: Hematology | Admitting: Hematology

## 2022-09-23 VITALS — BP 94/69 | HR 79 | Temp 97.5°F | Resp 19

## 2022-09-23 DIAGNOSIS — D508 Other iron deficiency anemias: Secondary | ICD-10-CM | POA: Diagnosis not present

## 2022-09-23 DIAGNOSIS — D631 Anemia in chronic kidney disease: Secondary | ICD-10-CM | POA: Insufficient documentation

## 2022-09-23 DIAGNOSIS — D472 Monoclonal gammopathy: Secondary | ICD-10-CM | POA: Diagnosis not present

## 2022-09-23 DIAGNOSIS — E611 Iron deficiency: Secondary | ICD-10-CM | POA: Diagnosis not present

## 2022-09-23 DIAGNOSIS — C884 Extranodal marginal zone B-cell lymphoma of mucosa-associated lymphoid tissue [MALT-lymphoma]: Secondary | ICD-10-CM | POA: Insufficient documentation

## 2022-09-23 DIAGNOSIS — N189 Chronic kidney disease, unspecified: Secondary | ICD-10-CM | POA: Insufficient documentation

## 2022-09-23 DIAGNOSIS — D375 Neoplasm of uncertain behavior of rectum: Secondary | ICD-10-CM | POA: Diagnosis not present

## 2022-09-23 NOTE — Patient Instructions (Addendum)
[facility] at [facility] Discharge Instructions   You were seen and examined today by [name].  He reviewed the results of your lab work which are normal/stable.   We will see you back in 3 months. We will repeat lab work prior to next visit.    Thank you for choosing  [facility] at [facility] to provide your oncology and hematology care.  To afford each patient quality time with our provider, please arrive at least 15 minutes before your scheduled appointment time.   If you have a lab appointment with the Cancer Center please come in thru the Main Entrance and check in at the main information desk.  You need to re-schedule your appointment should you arrive 10 or more minutes late.  We strive to give you quality time with our providers, and arriving late affects you and other patients whose appointments are after yours.  Also, if you no show three or more times for appointments you may be dismissed from the clinic at the providers discretion.     Again, thank you for choosing [facility].  Our hope is that these requests will decrease the amount of time that you wait before being seen by our physicians.       _____________________________________________________________  Should you have questions after your visit to [facility], please contact our office at [phone] and follow the prompts.  Our office hours are 8:00 a.m. and 4:30 p.m. Monday - Friday.  Please note that voicemails left after 4:00 p.m. may not be returned until the following business day.  We are closed weekends and major holidays.  You do have access to a nurse 24-7, just call the main number to the clinic [phone] and do not press any options, hold on the line and a nurse will answer the phone.    For prescription refill requests, have your pharmacy contact our office and allow 72 hours.    Due to Covid, you will need to wear a mask upon  entering the hospital. If you do not have a mask, a mask will be given to you at the Main Entrance upon arrival. For doctor visits, patients may have 1 support person age 38 or older with them. For treatment visits, patients can not have anyone with them due to social distancing guidelines and our immunocompromised population.

## 2022-09-23 NOTE — Progress Notes (Signed)
[facility] [address] [place] [address]    Clinic Day:  09/23/2022  Referring physician: [name], MD  Patient Care Team: [name], MD as PCP - General (Internal Medicine) [name], MD as PCP - Cardiology (Cardiology) Doreatha Massed, MD as Medical Oncologist (Hematology) Jena Gauss Gerrit Friends, MD as Consulting Physician (Gastroenterology) [name], MD as Consulting Physician (General Surgery) McKenzie, Mardene Celeste, MD as Consulting Physician (Urology)   ASSESSMENT & PLAN:   Assessment: 1.  [name]: - Patient seen at the request of Dr. Sherril Croon - CBC on 06/12/2021 with hemoglobin 9.1, MCV 94 with normal white count and platelet count.  Creatinine was 1.4. - CBC on 05/26/2021: Hemoglobin-10, creatinine 1.56 - CBC on 02/24/2021: Hemoglobin-9.6 - He reports that he has received Feraheme in December 2020 in [place] without any major problems. - He is currently taking iron tablet twice daily.  Mild constipation. - Last colonoscopy in 2010 with polyps. - BMBX (09/02/2021): Non-Hodgkin's lymphoma, favoring MALT lymphoma.  MYD 88 was negative. - Cytogenetics: 79, XY. - Myeloma FISH panel: Negative. - FISH for low-grade lymphoma: Negative for BCL6 rearrangement, MALT1 rearrangement, t(11;14) and t(14;18).   2. Social/family history: - He lives at home with his wife.  He walks with help of walker since his left knee replacement and multiple infections following it. - He worked in [employer], [employer] and a Geophysicist/field seismologist. - He quit smoking in 1984.  But chews tobacco at this time. - No family history of cancers.    Plan: [name]: - He was recently hospitalized and is currently at [facility] nursing care [facility].  He is receiving physical therapy. - His functional status is improving. - Reviewed labs from 09/16/2022: Hb-11.9, WBC-8, PLT-396, creatinine-1.91, calcium 8.7. - No  indication for treatment at this time.  RTC 3 months for follow-up.  2.  Normocytic anemia: - Anemia from CKD and relative iron deficiency. - Last Feraheme on 06/16/2022. - CBC on 09/16/2022 with hemoglobin 11.9, ferritin 395 and percent saturation 81.  3.  IgG kappa monoclonal gammopathy: - Previous IgG M spike was 1.4 g, which increased to 2.6 g on 09/16/2022.  This could be from MALT lymphoma. - Plan to repeat it in 3 months.  4.  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    Orders Placed This Encounter  Procedures   CBC with Differential    Standing Status:   Future    Standing Expiration Date:   09/23/2023   Comprehensive metabolic panel    Standing Status:   Future    Standing Expiration Date:   09/23/2023   Lactate dehydrogenase    Standing Status:   Future    Standing Expiration Date:   09/23/2023   Iron and TIBC ([facility] ONLY)    Standing Status:   Future    Standing Expiration Date:   09/23/2023   Ferritin    Standing Status:   Future    Standing Expiration Date:   09/23/2023   Kappa/lambda light chains    Standing Status:   Future    Standing Expiration Date:   09/23/2023   Protein electrophoresis, serum    Standing Status:   Future    Standing Expiration Date:   09/23/2023      I,Katie Daubenspeck,acting as a scribe for Doreatha Massed, MD.,have documented all relevant documentation on the behalf of Doreatha Massed,  MD,as directed by  Doreatha Massed, MD while in the presence of Doreatha Massed, MD.   I, Doreatha Massed MD, have reviewed the above documentation for accuracy and completeness, and I agree with the above.   Doreatha Massed, MD   5/22/20245:04 PM  CHIEF COMPLAINT:   Diagnosis: MALT lymphoma, normocytic anemia, and MGUS    Cancer Staging  MALT lymphoma (HCC) Staging form: Hodgkin and Non-Hodgkin Lymphoma, AJCC 8th Edition - Clinical stage from 09/16/2021: Stage IV (Unknown) -  Unsigned    Prior Therapy: none  Current Therapy:  surveillance   HISTORY OF PRESENT ILLNESS:   Oncology History   No history exists.     INTERVAL HISTORY:   Bradley Sheppard is a 82 y.o. male presenting to clinic today for follow up of MALT lymphoma, normocytic anemia, and MGUS. He was last seen by me on 05/27/22.  Since his last visit, he was admitted on 09/04/22 for congestive heart failure.  Today, he states that he is doing well overall. His appetite level is at 100%. His energy level is at 60%.  PAST MEDICAL HISTORY:   Past Medical History: Past Medical History:  Diagnosis Date   (HFpEF) heart failure with preserved ejection fraction (HCC)    Anxiety    Aortic stenosis    Arthritis    Asthma    Chronic pain    CKD (chronic kidney disease) stage 3, GFR 30-59 ml/min (HCC)    Coronary atherosclerosis of native coronary artery    Multvessel, DES to LAD and RCA 8/03; EF 65% by echo 11/2014   DM2 (diabetes mellitus, type 2) (HCC)    Essential hypertension    Gastric ulcer    Gastritis    Related to NSAIDs   GERD (gastroesophageal reflux disease)    Iron deficiency anemia    Mitral valve regurgitation    Mixed hyperlipidemia    Myocardial infarction [facility])    Nephrolithiasis     Surgical History: Past Surgical History:  Procedure Laterality Date   APPENDECTOMY     BIOPSY  09/25/2021   Procedure: BIOPSY;  Surgeon: Corbin Ade, MD;  Location: [facility];  Service: Endoscopy;;   CARDIAC CATHETERIZATION  01/2002   90% obstruction in proximal LAD, 60 % obstruction in proximal circumflex and 70%  in proximal RCA. LAD & RCA stented  stents x 2   COLONOSCOPY WITH PROPOFOL N/A 09/25/2021   Procedure: COLONOSCOPY WITH PROPOFOL;  Surgeon: Corbin Ade, MD;  Location: [facility];  Service: Endoscopy;  Laterality: N/A;  11:00am   L knee replacement  2009   Subsequent infectino requiring resectino arthroplasty with incision and drainage 8/10, and reimplantizathion  arthroplasty, 9/20. 5 total surgeries.   PARTIAL PROCTECTOMY BY TEM N/A 12/31/2021   Procedure: TEM PARTIAL PROTECTOMY;  Surgeon: [name], MD;  Location: [facility];  Service: General;  Laterality: N/A;    Social History: Social History   Socioeconomic History   Marital status: Married    Spouse name: Not on file   Number of children: Not on file   Years of education: Not on file   Highest education level: Not on file  Occupational History   Not on file  Tobacco Use   Smoking status: Former    Types: Cigarettes    Passive exposure: Never   Smokeless tobacco: Current    Types: Chew  Vaping Use   Vaping Use: Never used  Substance and Sexual Activity   Alcohol use: No   Drug use: No  Sexual activity: Not Currently  Other Topics Concern   Not on file  Social History Narrative   Full time- textiles         Social Determinants of Health   Financial Resource Strain: Not on file  Food Insecurity: No Food Insecurity (09/05/2022)   Hunger Vital Sign    Worried About Running Out of Food in the Last Year: Never true    Ran Out of Food in the Last Year: Never true  Transportation Needs: No Transportation Needs (09/05/2022)   PRAPARE - Administrator, Civil Service (Medical): No    Lack of Transportation (Non-Medical): No  Physical Activity: Not on file  Stress: Not on file  Social Connections: Not on file  Intimate Partner Violence: Not At Risk (09/05/2022)   Humiliation, Afraid, Rape, and Kick questionnaire    Fear of Current or Ex-Partner: No    Emotionally Abused: No    Physically Abused: No    Sexually Abused: No    Family History: Family History  Problem Relation Age of Onset   Diabetes Mother    Aneurysm Mother        Brain   Stroke Father    Stroke Other    Colon cancer Neg Hx     Current Medications:  Current Outpatient Medications:    acetaminophen (TYLENOL) 500 MG tablet, Take 1,000 mg by mouth every 6 (six) hours as needed for moderate  pain., Disp: , Rfl:    aspirin EC 81 MG tablet, Take 1 tablet (81 mg total) by mouth daily with breakfast. Swallow whole., Disp: 30 tablet, Rfl: 12   Calcium Polycarbophil (FIBER-LAX PO), Take 1 capsule by mouth daily., Disp: , Rfl:    carvedilol (COREG) 12.5 MG tablet, Take 1 tablet (12.5 mg total) by mouth 2 (two) times daily with a meal., Disp: 60 tablet, Rfl: 2   feeding supplement (ENSURE SURGERY) LIQD, Take 237 mLs by mouth 2 (two) times daily between meals., Disp: , Rfl:    furosemide (LASIX) 40 MG tablet, Take 1 tablet (40 mg total) by mouth daily., Disp: 30 tablet, Rfl: 1   glipiZIDE (GLUCOTROL XL) 10 MG 24 hr tablet, Take 1 tablet (10 mg total) by mouth daily with breakfast. (Patient taking differently: Take 10 mg by mouth 2 (two) times daily.), Disp: , Rfl:    insulin glargine (LANTUS) 100 UNIT/ML injection, Inject 0.1 mLs (10 Units total) into the skin daily., Disp: , Rfl:    LORazepam (ATIVAN) 0.5 MG tablet, Take 1 tablet (0.5 mg total) by mouth at bedtime as needed for anxiety or sleep., Disp: 3 tablet, Rfl: 0   metolazone (ZAROXOLYN) 5 MG tablet, Take 5 mg by mouth 2 (two) times a week., Disp: , Rfl:    nitroGLYCERIN (NITROSTAT) 0.4 MG SL tablet, Place 1 tablet (0.4 mg total) under the tongue every 5 (five) minutes as needed., Disp: 25 tablet, Rfl: 3   oxyCODONE (OXY IR/ROXICODONE) 5 MG immediate release tablet, Take 0.5-1 tablets (2.5-5 mg total) by mouth every 6 (six) hours as needed for severe pain., Disp: 5 tablet, Rfl: 0   polyethylene glycol (MIRALAX / GLYCOLAX) 17 g packet, Take 17 g by mouth 2 (two) times daily as needed for mild constipation., Disp: 14 each, Rfl: 0   potassium chloride (KLOR-CON) 10 MEQ tablet, Take 20 mEq by mouth daily as needed. Take with [employer], Disp: , Rfl:    rOPINIRole (REQUIP) 0.25 MG tablet, Take 0.25 mg by mouth at bedtime., Disp: ,  Rfl:    rosuvastatin (CRESTOR) 5 MG tablet, Take 5 mg by mouth every Wednesday., Disp: , Rfl:    tamsulosin  (FLOMAX) 0.4 MG CAPS capsule, Take 1 capsule (0.4 mg total) by mouth daily., Disp: 90 capsule, Rfl: 3   vitamin B-12 (CYANOCOBALAMIN) 500 MCG tablet, Take 500 mcg by mouth daily., Disp: , Rfl:    vitamin C (ASCORBIC ACID) 500 MG tablet, Take 500 mg by mouth at bedtime., Disp: , Rfl:    Zinc Oxide 40 % PSTE, Apply 1 Application topically as needed. Apply to buttock and sacrum if silicone foam is not effective due to incontinence, Disp: 453 g, Rfl: 1   Allergies: Allergies  Allergen Reactions   Nsaids Other (See Comments)    On blood thinners with h/o IDA/bleeding   Prednisone Other (See Comments)    "makes him feel faint", hyperglycemic     REVIEW OF SYSTEMS:   Review of Systems  Constitutional:  Negative for chills, fatigue and fever.  HENT:   Negative for lump/mass, mouth sores, nosebleeds, sore throat and trouble swallowing.   Eyes:  Negative for eye problems.  Respiratory:  Positive for shortness of breath. Negative for cough.   Cardiovascular:  Negative for chest pain, leg swelling and palpitations.  Gastrointestinal:  Negative for abdominal pain, constipation, diarrhea, nausea and vomiting.  Genitourinary:  Negative for bladder incontinence, difficulty urinating, dysuria, frequency, hematuria and nocturia.   Musculoskeletal:  Negative for arthralgias, back pain, flank pain, myalgias and neck pain.  Skin:  Negative for itching and rash.  Neurological:  Positive for dizziness and numbness. Negative for headaches.  Hematological:  Does not bruise/bleed easily.  Psychiatric/Behavioral:  Negative for depression, sleep disturbance and suicidal ideas. The patient is not nervous/anxious.   All other systems reviewed and are negative.    VITALS:   Blood pressure 94/69, pulse 79, temperature (!) 97.5 F (36.4 C), temperature source Oral, resp. rate 19, SpO2 99 %.  Wt Readings from Last 3 Encounters:  09/10/22 202 lb 6.1 oz (91.8 kg)  05/27/22 205 lb (93 kg)  04/20/22 191 lb (86.6  kg)    There is no height or weight on file to calculate BMI.  Performance status (ECOG): 1 - Symptomatic but completely ambulatory  PHYSICAL EXAM:   Physical Exam Vitals and nursing note reviewed. Exam conducted with a chaperone present.  Constitutional:      Appearance: Normal appearance.  Cardiovascular:     Rate and Rhythm: Normal rate and regular rhythm.     Pulses: Normal pulses.     Heart sounds: Normal heart sounds.  Pulmonary:     Effort: Pulmonary effort is normal.     Breath sounds: Normal breath sounds.  Abdominal:     Palpations: Abdomen is soft. There is no hepatomegaly, splenomegaly or mass.     Tenderness: There is no abdominal tenderness.  Musculoskeletal:     Right lower leg: No edema.     Left lower leg: No edema.  Lymphadenopathy:     Cervical: No cervical adenopathy.     Right cervical: No superficial, deep or posterior cervical adenopathy.    Left cervical: No superficial, deep or posterior cervical adenopathy.     Upper Body:     Right upper body: No supraclavicular or axillary adenopathy.     Left upper body: No supraclavicular or axillary adenopathy.  Neurological:     General: No focal deficit present.     Mental Status: He is alert and oriented to person,  place, and time.  Psychiatric:        Mood and Affect: Mood normal.        Behavior: Behavior normal.     LABS:      Latest Ref Rng & Units 09/09/2022    4:41 AM 09/07/2022    5:20 AM 09/06/2022    4:47 AM  CBC  WBC 4.0 - 10.5 K/uL 8.6  6.7  5.2   Hemoglobin 13.0 - 17.0 g/dL 16.1  09.6  04.5   Hematocrit 39.0 - 52.0 % 33.3  31.6  31.7   Platelets 150 - 400 K/uL 177  219  182       Latest Ref Rng & Units 09/09/2022    4:41 AM 09/08/2022    7:37 AM 09/07/2022    5:20 AM  CMP  Glucose 70 - 99 mg/dL 409  811  914   BUN 8 - 23 mg/dL 53  54  50   Creatinine 0.61 - 1.24 mg/dL 7.82  9.56  2.13   Sodium 135 - 145 mmol/L 133  131  132   Potassium 3.5 - 5.1 mmol/L 3.9  3.7  3.7   Chloride 98 - 111  mmol/L 96  95  97   CO2 22 - 32 mmol/L 27  27  27    Calcium 8.9 - 10.3 mg/dL 8.1  8.0  7.9      No results found for: "CEA1", "CEA" / No results found for: "CEA1", "CEA" No results found for: "PSA1" No results found for: "CAN199" No results found for: "CAN125"  Lab Results  Component Value Date   TOTALPROTELP 7.3 07/11/2021   TOTALPROTELP 7.1 07/11/2021   ALBUMINELP 3.3 07/11/2021   A1GS 0.2 07/11/2021   A2GS 0.8 07/11/2021   BETS 0.8 07/11/2021   GAMS 1.9 (H) 07/11/2021   MSPIKE 1.4 (H) 07/11/2021   SPEI Comment 07/11/2021   Lab Results  Component Value Date   TIBC 261 05/27/2022   TIBC 199 (L) 01/28/2022   TIBC 222 (L) 11/25/2021   FERRITIN 131 05/27/2022   FERRITIN 373 (H) 01/28/2022   FERRITIN 246 11/25/2021   IRONPCTSAT 13 (L) 05/27/2022   IRONPCTSAT 13 (L) 01/28/2022   IRONPCTSAT 10 (L) 11/25/2021   Lab Results  Component Value Date   LDH 153 05/27/2022   LDH 166 01/28/2022   LDH 130 11/25/2021     STUDIES:   ECHOCARDIOGRAM COMPLETE  Result Date: 09/08/2022    ECHOCARDIOGRAM REPORT   Patient Name:   Bradley Sheppard Date of Exam: 09/05/2022 Medical Rec #:  [id number]        Height:       72.0 in Accession #:    [id number]       Weight:       205.0 lb Date of Birth:  [birth year]        BSA:          2.153 m Patient Age:    82 years         BP:           107/67 mmHg Patient Gender: M                HR:           80 bpm. Exam Location:  Inpatient Procedure: 2D Echo, Color Doppler and Cardiac Doppler Indications:    acute diastolic chf  History:        Patient has prior history of Echocardiogram examinations, most  recent 11/24/2021. CHF, CAD, chronic kidney disease, Aortic Valve                 Disease, Arrythmias:Atrial Fibrillation; Risk                 Factors:Hypertension and Dyslipidemia.  Sonographer:    Delcie Roch RDCS Referring Phys: [id number] OLADAPO ADEFESO  Sonographer Comments: Image acquisition challenging due to respiratory motion.  IMPRESSIONS  1. Left ventricular ejection fraction, by estimation, is 55 to 60%. The left ventricle has normal function. The left ventricle has no regional wall motion abnormalities. There is severe left ventricular hypertrophy. Left ventricular diastolic parameters  are indeterminate.  2. RV not well visualized. Grossly appears normal in size and function. . Right ventricular systolic function was not well visualized. The right ventricular size is not well visualized. Tricuspid regurgitation signal is inadequate for assessing PA pressure.  3. A small pericardial effusion is present. The pericardial effusion is circumferential.  4. The mitral valve is normal in structure. No evidence of mitral valve regurgitation. No evidence of mitral stenosis.  5. LVOT and AV Dopplers are technically limited, likely underestimated values based on angle of acquisition. Consider limited study to reevaluate aortic valve, appears to be at least some degree of stenosis. . The aortic valve is tricuspid. There is moderate calcification of the aortic valve. There is moderate thickening of the aortic valve. Aortic valve regurgitation is not visualized.  6. The inferior vena cava is dilated in size with <50% respiratory variability, suggesting right atrial pressure of 15 mmHg. FINDINGS  Left Ventricle: Left ventricular ejection fraction, by estimation, is 55 to 60%. The left ventricle has normal function. The left ventricle has no regional wall motion abnormalities. The left ventricular internal cavity size was normal in size. There is  severe left ventricular hypertrophy. Left ventricular diastolic parameters are indeterminate. Right Ventricle: RV not well visualized. Grossly appears normal in size and function. The right ventricular size is not well visualized. Right vetricular wall thickness was not well visualized. Right ventricular systolic function was not well visualized.  Tricuspid regurgitation signal is inadequate for assessing PA  pressure. Left Atrium: Left atrial size was not well visualized. Right Atrium: Right atrial size was not well visualized. Pericardium: A small pericardial effusion is present. The pericardial effusion is circumferential. Mitral Valve: The mitral valve is normal in structure. No evidence of mitral valve regurgitation. No evidence of mitral valve stenosis. Tricuspid Valve: The tricuspid valve is normal in structure. Tricuspid valve regurgitation is not demonstrated. No evidence of tricuspid stenosis. Aortic Valve: LVOT and AV Dopplers are technically limited, likely underestimated values based on angle of acquisition. Consider limited study to reevaluate aortic valve, appears to be at least some degree of stenosis. The aortic valve is tricuspid. There is moderate calcification of the aortic valve. There is moderate thickening of the aortic valve. There is moderate aortic valve annular calcification. Aortic valve regurgitation is not visualized. Aortic valve mean gradient measures 7.6 mmHg. Aortic valve peak gradient measures 13.6 mmHg. Aortic valve area, by VTI measures 0.82 cm. Pulmonic Valve: The pulmonic valve was not well visualized. Pulmonic valve regurgitation is not visualized. No evidence of pulmonic stenosis. Aorta: The aortic root is normal in size and structure. Venous: The inferior vena cava is dilated in size with less than 50% respiratory variability, suggesting right atrial pressure of 15 mmHg. IAS/Shunts: No atrial level shunt detected by color flow Doppler.  LEFT VENTRICLE PLAX 2D LVIDd:  3.80 cm LVIDs:         2.80 cm LV PW:         1.60 cm LV IVS:        1.60 cm LVOT diam:     1.80 cm LV SV:         25 LV SV Index:   12 LVOT Area:     2.54 cm  RIGHT VENTRICLE            IVC RV S prime:     9.57 cm/s  IVC diam: 2.50 cm TAPSE (M-mode): 1.5 cm LEFT ATRIUM             Index        RIGHT ATRIUM           Index LA diam:        3.90 cm 1.81 cm/m   RA Area:     22.70 cm LA Vol (A2C):   75.1 ml  34.88 ml/m  RA Volume:   68.40 ml  31.77 ml/m LA Vol (A4C):   82.4 ml 38.27 ml/m LA Biplane Vol: 86.0 ml 39.94 ml/m  AORTIC VALVE AV Area (Vmax):    0.86 cm AV Area (Vmean):   0.78 cm AV Area (VTI):     0.82 cm AV Vmax:           184.60 cm/s AV Vmean:          128.489 cm/s AV VTI:            0.309 m AV Peak Grad:      13.6 mmHg AV Mean Grad:      7.6 mmHg LVOT Vmax:         62.02 cm/s LVOT Vmean:        39.600 cm/s LVOT VTI:          0.100 m LVOT/AV VTI ratio: 0.32  AORTA Ao Root diam: 3.30 cm  SHUNTS Systemic VTI:  0.10 m Systemic Diam: 1.80 cm Dina Rich MD Electronically signed by Dina Rich MD Signature Date/Time: 09/08/2022/3:00:11 PM    Final    DG Chest Port 1 View  Result Date: 09/04/2022 CLINICAL DATA:  Shortness of breath EXAM: PORTABLE CHEST 1 VIEW COMPARISON:  CXR 01/11/22 FINDINGS: Cardiomegaly. No pleural effusion. No pneumothorax. There are prominent bilateral interstitial opacities which likely represent a combination of patient's known fibrotic lung disease and mild pulmonary edema. There may be a focal retrocardiac opacity, which could represent infection. No radiographically apparent displaced rib fractures. Visualized upper abdomen is unremarkable. Vertebral body heights are maintained. IMPRESSION: 1. Cardiomegaly with mild pulmonary edema. 2. Findings of fibrotic lung disease with a more focal retrocardiac opacity, which could represent superimposed infection. Electronically Signed   By: Lorenza Cambridge M.D.   On: 09/04/2022 16:53

## 2022-09-29 DIAGNOSIS — N1831 Chronic kidney disease, stage 3a: Secondary | ICD-10-CM | POA: Diagnosis not present

## 2022-09-29 DIAGNOSIS — I5032 Chronic diastolic (congestive) heart failure: Secondary | ICD-10-CM | POA: Diagnosis not present

## 2022-09-29 DIAGNOSIS — Z299 Encounter for prophylactic measures, unspecified: Secondary | ICD-10-CM | POA: Diagnosis not present

## 2022-10-01 DIAGNOSIS — E871 Hypo-osmolality and hyponatremia: Secondary | ICD-10-CM | POA: Diagnosis not present

## 2022-10-01 DIAGNOSIS — Z7982 Long term (current) use of aspirin: Secondary | ICD-10-CM | POA: Diagnosis not present

## 2022-10-01 DIAGNOSIS — Z6829 Body mass index (BMI) 29.0-29.9, adult: Secondary | ICD-10-CM | POA: Diagnosis not present

## 2022-10-01 DIAGNOSIS — C884 Extranodal marginal zone B-cell lymphoma of mucosa-associated lymphoid tissue [MALT-lymphoma]: Secondary | ICD-10-CM | POA: Diagnosis not present

## 2022-10-01 DIAGNOSIS — Z7984 Long term (current) use of oral hypoglycemic drugs: Secondary | ICD-10-CM | POA: Diagnosis not present

## 2022-10-01 DIAGNOSIS — I480 Paroxysmal atrial fibrillation: Secondary | ICD-10-CM | POA: Diagnosis not present

## 2022-10-01 DIAGNOSIS — I5033 Acute on chronic diastolic (congestive) heart failure: Secondary | ICD-10-CM | POA: Diagnosis not present

## 2022-10-01 DIAGNOSIS — I2510 Atherosclerotic heart disease of native coronary artery without angina pectoris: Secondary | ICD-10-CM | POA: Diagnosis not present

## 2022-10-01 DIAGNOSIS — J189 Pneumonia, unspecified organism: Secondary | ICD-10-CM | POA: Diagnosis not present

## 2022-10-01 DIAGNOSIS — Z794 Long term (current) use of insulin: Secondary | ICD-10-CM | POA: Diagnosis not present

## 2022-10-01 DIAGNOSIS — I130 Hypertensive heart and chronic kidney disease with heart failure and stage 1 through stage 4 chronic kidney disease, or unspecified chronic kidney disease: Secondary | ICD-10-CM | POA: Diagnosis not present

## 2022-10-01 DIAGNOSIS — E1122 Type 2 diabetes mellitus with diabetic chronic kidney disease: Secondary | ICD-10-CM | POA: Diagnosis not present

## 2022-10-01 DIAGNOSIS — E8809 Other disorders of plasma-protein metabolism, not elsewhere classified: Secondary | ICD-10-CM | POA: Diagnosis not present

## 2022-10-01 DIAGNOSIS — I4581 Long QT syndrome: Secondary | ICD-10-CM | POA: Diagnosis not present

## 2022-10-03 DIAGNOSIS — I509 Heart failure, unspecified: Secondary | ICD-10-CM | POA: Diagnosis not present

## 2022-10-03 DIAGNOSIS — I5031 Acute diastolic (congestive) heart failure: Secondary | ICD-10-CM | POA: Diagnosis not present

## 2022-10-03 DIAGNOSIS — L8990 Pressure ulcer of unspecified site, unspecified stage: Secondary | ICD-10-CM | POA: Diagnosis not present

## 2022-10-03 DIAGNOSIS — R0602 Shortness of breath: Secondary | ICD-10-CM | POA: Diagnosis not present

## 2022-10-06 ENCOUNTER — Encounter: Payer: Self-pay | Admitting: Cardiology

## 2022-10-06 ENCOUNTER — Ambulatory Visit: Payer: HMO | Attending: Cardiology | Admitting: Cardiology

## 2022-10-06 VITALS — BP 104/62 | HR 65 | Ht 72.0 in | Wt 198.0 lb

## 2022-10-06 DIAGNOSIS — I480 Paroxysmal atrial fibrillation: Secondary | ICD-10-CM

## 2022-10-06 DIAGNOSIS — I350 Nonrheumatic aortic (valve) stenosis: Secondary | ICD-10-CM

## 2022-10-06 DIAGNOSIS — N1832 Chronic kidney disease, stage 3b: Secondary | ICD-10-CM

## 2022-10-06 DIAGNOSIS — Z79899 Other long term (current) drug therapy: Secondary | ICD-10-CM

## 2022-10-06 DIAGNOSIS — I130 Hypertensive heart and chronic kidney disease with heart failure and stage 1 through stage 4 chronic kidney disease, or unspecified chronic kidney disease: Secondary | ICD-10-CM | POA: Diagnosis not present

## 2022-10-06 DIAGNOSIS — I5033 Acute on chronic diastolic (congestive) heart failure: Secondary | ICD-10-CM | POA: Diagnosis not present

## 2022-10-06 DIAGNOSIS — I5032 Chronic diastolic (congestive) heart failure: Secondary | ICD-10-CM

## 2022-10-06 DIAGNOSIS — E1122 Type 2 diabetes mellitus with diabetic chronic kidney disease: Secondary | ICD-10-CM | POA: Diagnosis not present

## 2022-10-06 MED ORDER — CARVEDILOL 6.25 MG PO TABS: 6.2500 mg | ORAL_TABLET | Freq: Two times a day (BID) | ORAL | 2 refills | Status: DC

## 2022-10-06 NOTE — Patient Instructions (Addendum)
Medication Instructions:  Your physician has recommended you make the following change in your medication:  Decrease carvedilol to 6.25 mg twice daily Continue other medications the same  Labwork: BMET in 1 month just before next visit. Non-fasting [employer] or [facility] ([address] [place])  Testing/Procedures: none  Follow-Up: Your physician recommends that you schedule a follow-up appointment in: 1 month  Any Other Special Instructions Will Be Listed Below (If Applicable).  If you need a refill on your cardiac medications before your next appointment, please call your pharmacy.

## 2022-10-06 NOTE — Progress Notes (Signed)
Cardiology Office Note  Date: 10/06/2022   ID: Blas, Fikes 1940-05-17, MRN [id number]  History of Present Illness: Bradley Sheppard is an 82 y.o. male last seen in October 2023.  He is here today with family members for a follow-up visit.  Records indicate hospitalization in May with acute on chronic HFpEF, clinically improved with IV Lasix and metolazone.  Also had suspected left lower lobe pneumonia and treated with antibiotics.  He has been back at home for about a week, completed rehabilitation course.  Weights have been stable at home, we discussed adjustments in diuretics based on weight change.  His leg edema is much improved.  Still with fairly limited mobility and requires assistance, he is in a wheelchair today.  I went over his medications, he is on Lasix 40 mg daily and use of metolazone twice weekly with potassium supplement.  Lab work noted below.  Blood pressure has been low normal to mildly reduced by home checks, we discussed reducing his Coreg dose for now although will need to keep an eye on heart rate with atrial fibrillation.  I did go over his interval echocardiogram.  He continues to follow with [name] in the oncology clinic, I reviewed the note from May 22.  Physical Exam: VS:  BP 104/62   Pulse 65   Ht 6' (1.829 m)   Wt 198 lb (89.8 kg)   SpO2 97%   BMI 26.85 kg/m , BMI Body mass index is 26.85 kg/m.  Wt Readings from Last 3 Encounters:  10/06/22 198 lb (89.8 kg)  09/10/22 202 lb 6.1 oz (91.8 kg)  05/27/22 205 lb (93 kg)    General: Patient appears comfortable at rest. HEENT: Conjunctiva and lids normal. Neck: Supple, no elevated JVP or carotid bruits. Lungs: Clear to auscultation, nonlabored breathing at rest. Cardiac: RRR, 2/6 systolic murmur without gallop. Extremities: Significantly improved lower leg edema.  ECG:  An ECG dated 09/04/2022 was personally reviewed today and demonstrated:  Fibrillation with RVR, left anterior  fascicular block, poor R wave progression rule out old anterior infarct pattern.  Labwork: February 2024: Cholesterol 131, triglycerides 106, HDL 35, LDL 76 09/04/2022: B Natriuretic Peptide 380.0 09/05/2022: ALT 26; AST 35 09/09/2022: BUN 53; Creatinine, Ser 1.54; Hemoglobin 11.0; Magnesium 2.0; Platelets 177; Potassium 3.9; Sodium 133   Other Studies Reviewed Today:  Echocardiogram 09/05/2022:  1. Left ventricular ejection fraction, by estimation, is 55 to 60%. The  left ventricle has normal function. The left ventricle has no regional  wall motion abnormalities. There is severe left ventricular hypertrophy.  Left ventricular diastolic parameters   are indeterminate.   2. RV not well visualized. Grossly appears normal in size and function. .  Right ventricular systolic function was not well visualized. The right  ventricular size is not well visualized. Tricuspid regurgitation signal is  inadequate for assessing PA  pressure.   3. A small pericardial effusion is present. The pericardial effusion is  circumferential.   4. The mitral valve is normal in structure. No evidence of mitral valve  regurgitation. No evidence of mitral stenosis.   5. LVOT and AV Dopplers are technically limited, likely underestimated  values based on angle of acquisition. Consider limited study to reevaluate  aortic valve, appears to be at least some degree of stenosis. . The aortic  valve is tricuspid. There is  moderate calcification of the aortic valve. There is moderate thickening  of the aortic valve. Aortic valve regurgitation is not visualized.  6. The inferior vena cava is dilated in size with <50% respiratory  variability, suggesting right atrial pressure of 15 mmHg.   Assessment and Plan:  1.  CAD status post DES to the LAD and RCA in 2003.  He does not report any active angina at this time with limited activity level.  Continue aspirin, Coreg, and Crestor.  2.  HFpEF, LVEF 55 to 60% with severe LVH  byrecent echocardiogram.  Weight trended down and he is clinically improved at this point.  Leg edema much improved.  Continue Lasix 40 mg daily with metolazone twice weekly and potassium supplement.  Check BMET for clinical follow-up in 1 month.  We discussed adjusting diuretics based on weight change at home.  3.  Paroxysmal atrial fibrillation with CHA2DS2-VASc score of 5.  Not anticoagulated with history of recurrent rectal bleeding.  No reported palpitations.  Reducing Coreg to 6.25 mg twice daily for now, need to keep an eye on heart rate.  4.  Degenerative calcific aortic stenosis, overall moderate range by testing over the last year.  Most recent echocardiogram demonstrated mean AV gradient of approximately 14 mmHg and dimensionless index 0.32.  Likely paradoxical normal flow/low gradient.  5.  CKD stage IIIb, recent creatinine 1.54 in May.  Disposition:  Follow up  1 month.  Signed, [name], M.D., F.A.C.C. [facility]

## 2022-10-07 DIAGNOSIS — I25119 Atherosclerotic heart disease of native coronary artery with unspecified angina pectoris: Secondary | ICD-10-CM | POA: Diagnosis not present

## 2022-10-07 DIAGNOSIS — Z09 Encounter for follow-up examination after completed treatment for conditions other than malignant neoplasm: Secondary | ICD-10-CM | POA: Diagnosis not present

## 2022-10-07 DIAGNOSIS — E1122 Type 2 diabetes mellitus with diabetic chronic kidney disease: Secondary | ICD-10-CM | POA: Diagnosis not present

## 2022-10-07 DIAGNOSIS — I5032 Chronic diastolic (congestive) heart failure: Secondary | ICD-10-CM | POA: Diagnosis not present

## 2022-10-07 DIAGNOSIS — I10 Essential (primary) hypertension: Secondary | ICD-10-CM | POA: Diagnosis not present

## 2022-10-08 ENCOUNTER — Ambulatory Visit: Payer: HMO | Admitting: Nurse Practitioner

## 2022-10-19 ENCOUNTER — Encounter: Payer: Self-pay | Admitting: Urology

## 2022-10-19 ENCOUNTER — Ambulatory Visit: Payer: PPO | Admitting: Urology

## 2022-10-19 ENCOUNTER — Ambulatory Visit (INDEPENDENT_AMBULATORY_CARE_PROVIDER_SITE_OTHER): Payer: HMO | Admitting: Urology

## 2022-10-19 VITALS — BP 113/48 | HR 53

## 2022-10-19 DIAGNOSIS — N138 Other obstructive and reflux uropathy: Secondary | ICD-10-CM

## 2022-10-19 DIAGNOSIS — N401 Enlarged prostate with lower urinary tract symptoms: Secondary | ICD-10-CM | POA: Diagnosis not present

## 2022-10-19 LAB — URINALYSIS, ROUTINE W REFLEX MICROSCOPIC
Bilirubin, UA: NEGATIVE
Glucose, UA: NEGATIVE
Ketones, UA: NEGATIVE
Leukocytes,UA: NEGATIVE
Nitrite, UA: NEGATIVE
Protein,UA: NEGATIVE
RBC, UA: NEGATIVE
Specific Gravity, UA: 1.025 (ref 1.005–1.030)
Urobilinogen, Ur: 0.2 mg/dL (ref 0.2–1.0)
pH, UA: 5.5 (ref 5.0–7.5)

## 2022-10-19 LAB — BLADDER SCAN AMB NON-IMAGING: Scan Result: 0

## 2022-10-19 NOTE — Progress Notes (Unsigned)
10/19/2022 10:44 AM   Bradley Sheppard [birth year] [id number]  Referring provider: [name], MD [address] [place],  [place] [address]  No chief complaint on file.   HPI:  F/u -    1) BPH - on tamsulosin. Prostate about 30 g on CT Sep 2023 and otherwise CT benign. 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 He passed a void trial.    F/u PVR 77 ml. He then voided 60 ml for a UA. Adequate stream. Some urgency and UUI.   Today, seen for the above. PVR 0.  He has urgency and nocturia. No inability to void. No dysuria or gross hematuria. He was in hospital. Cardiac issues and pneumonia. Went to rehab. Again, fortunately, no urinary retention. He is in a wheelchair. Assist for transfer.    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   PMH: Past Medical History:  Diagnosis Date   (HFpEF) heart failure with preserved ejection fraction (HCC)    Anxiety    Aortic stenosis    Arthritis    Asthma    Chronic pain    CKD (chronic kidney disease) stage 3, GFR 30-59 ml/min (HCC)    Coronary atherosclerosis of native coronary artery    Multvessel, DES to LAD and RCA 8/03; EF 65% by echo 11/2014   DM2 (diabetes mellitus, type 2) (HCC)    Essential hypertension    Gastric ulcer    Gastritis    Related to NSAIDs   GERD (gastroesophageal reflux disease)    Iron deficiency anemia    Mitral valve regurgitation    Mixed hyperlipidemia    Myocardial infarction [facility])    Nephrolithiasis     Surgical History: Past Surgical History:  Procedure Laterality Date   APPENDECTOMY     BIOPSY  09/25/2021   Procedure: BIOPSY;  Surgeon: Corbin Ade, MD;  Location: [facility];  Service: Endoscopy;;   CARDIAC CATHETERIZATION  01/2002   90% obstruction in proximal LAD, 60 % obstruction in proximal circumflex and 70%  in proximal RCA. LAD & RCA stented  stents x 2   COLONOSCOPY WITH PROPOFOL N/A 09/25/2021   Procedure:  COLONOSCOPY WITH PROPOFOL;  Surgeon: Corbin Ade, MD;  Location: [facility];  Service: Endoscopy;  Laterality: N/A;  11:00am   L knee replacement  2009   Subsequent infectino requiring resectino arthroplasty with incision and drainage 8/10, and reimplantizathion arthroplasty, 9/20. 5 total surgeries.   PARTIAL PROCTECTOMY BY TEM N/A 12/31/2021   Procedure: TEM PARTIAL PROTECTOMY;  Surgeon: [name], MD;  Location: [facility];  Service: General;  Laterality: N/A;    Home Medications:  Allergies as of 10/19/2022       Reactions   Nsaids Other (See Comments)   On blood thinners with h/o IDA/bleeding   Prednisone Other (See Comments)   "makes him feel faint", hyperglycemic         Medication List        Accurate as of October 19, 2022 10:44 AM. If you have any questions, ask your nurse or doctor.          acetaminophen 500 MG tablet Commonly known as: TYLENOL Take 1,000 mg by mouth every 6 (six) hours as needed for moderate pain.   ascorbic acid 500 MG tablet Commonly known as: VITAMIN C Take 500 mg by mouth at bedtime.   aspirin EC 81 MG tablet Take 1 tablet (81  mg total) by mouth daily with breakfast. Swallow whole.   carvedilol 6.25 MG tablet Commonly known as: COREG Take 1 tablet (6.25 mg total) by mouth 2 (two) times daily.   cyanocobalamin 500 MCG tablet Commonly known as: VITAMIN B12 Take 500 mcg by mouth daily.   feeding supplement Liqd Take 237 mLs by mouth 2 (two) times daily between meals.   FIBER-LAX PO Take 1 capsule by mouth daily.   furosemide 40 MG tablet Commonly known as: LASIX Take 1 tablet (40 mg total) by mouth daily.   glipiZIDE 10 MG 24 hr tablet Commonly known as: GLUCOTROL XL Take 1 tablet (10 mg total) by mouth daily with breakfast. What changed: when to take this   insulin glargine 100 UNIT/ML injection Commonly known as: LANTUS Inject 0.1 mLs (10 Units total) into the skin daily.   LORazepam 0.5 MG tablet Commonly known  as: ATIVAN Take 1 tablet (0.5 mg total) by mouth at bedtime as needed for anxiety or sleep.   metolazone 5 MG tablet Commonly known as: ZAROXOLYN Take 5 mg by mouth 2 (two) times a week.   nitroGLYCERIN 0.4 MG SL tablet Commonly known as: NITROSTAT Place 1 tablet (0.4 mg total) under the tongue every 5 (five) minutes as needed.   oxyCODONE 5 MG immediate release tablet Commonly known as: Oxy IR/ROXICODONE Take 0.5-1 tablets (2.5-5 mg total) by mouth every 6 (six) hours as needed for severe pain.   polyethylene glycol 17 g packet Commonly known as: MIRALAX / GLYCOLAX Take 17 g by mouth 2 (two) times daily as needed for mild constipation.   potassium chloride 10 MEQ tablet Commonly known as: KLOR-CON Take 20 mEq by mouth daily as needed. Take with Metalozone   rOPINIRole 0.25 MG tablet Commonly known as: REQUIP Take 0.25 mg by mouth at bedtime.   rosuvastatin 5 MG tablet Commonly known as: CRESTOR Take 5 mg by mouth every Wednesday.   tamsulosin 0.4 MG Caps capsule Commonly known as: Flomax Take 1 capsule (0.4 mg total) by mouth daily.   Zinc Oxide 40 % Pste Apply 1 Application topically as needed. Apply to buttock and sacrum if silicone foam is not effective due to incontinence        Allergies:  Allergies  Allergen Reactions   Nsaids Other (See Comments)    On blood thinners with h/o IDA/bleeding   Prednisone Other (See Comments)    "makes him feel faint", hyperglycemic     Family History: Family History  Problem Relation Age of Onset   Diabetes Mother    Aneurysm Mother        Brain   Stroke Father    Stroke Other    Colon cancer Neg Hx     Social History:  reports that he has quit smoking. His smoking use included cigarettes. He has never been exposed to tobacco smoke. He has quit using smokeless tobacco.  His smokeless tobacco use included chew. He reports that he does not drink alcohol and does not use drugs.   Physical Exam: BP (!) 113/48    Pulse (!) 53   Constitutional:  Alert and oriented, No acute distress. Elderly in wheelchair.  HEENT: [place] AT, moist mucus membranes.  Trachea midline, no masses. Cardiovascular: No clubbing, cyanosis, or edema. Respiratory: Normal respiratory effort, no increased work of breathing. GI: Abdomen is soft, nontender, nondistended, no abdominal masses GU: No CVA tenderness Skin: No rashes, bruises or suspicious lesions. Neurologic: Grossly intact, no focal deficits, moving all 4  extremities. Psychiatric: Normal mood and affect.  Laboratory Data: Lab Results  Component Value Date   WBC 8.6 09/09/2022   HGB 11.0 (L) 09/09/2022   HCT 33.3 (L) 09/09/2022   MCV 88.8 09/09/2022   PLT 177 09/09/2022    Lab Results  Component Value Date   CREATININE 1.54 (H) 09/09/2022    No results found for: "PSA"  No results found for: "TESTOSTERONE"  Lab Results  Component Value Date   HGBA1C 7.5 (H) 09/06/2022    Urinalysis    Component Value Date/Time   COLORURINE STRAW (A) 09/04/2022 1947   APPEARANCEUR CLEAR 09/04/2022 1947   APPEARANCEUR Cloudy (A) 04/20/2022 1051   LABSPEC 1.006 09/04/2022 1947   PHURINE 5.0 09/04/2022 1947   GLUCOSEU NEGATIVE 09/04/2022 1947   HGBUR NEGATIVE 09/04/2022 1947   BILIRUBINUR NEGATIVE 09/04/2022 1947   BILIRUBINUR Negative 04/20/2022 1051   KETONESUR NEGATIVE 09/04/2022 1947   PROTEINUR NEGATIVE 09/04/2022 1947   UROBILINOGEN 0.2 12/26/2008 0950   NITRITE NEGATIVE 09/04/2022 1947   LEUKOCYTESUR NEGATIVE 09/04/2022 1947    Lab Results  Component Value Date   LABMICR See below: 04/20/2022   WBCUA >30 (A) 04/20/2022   LABEPIT 0-10 04/20/2022   BACTERIA Many (A) 04/20/2022    Pertinent Imaging:  Results for orders placed during the hospital encounter of 01/08/22  US Venous Img Lower Bilateral (DVT)  Narrative CLINICAL DATA:  Bilateral lower extremity edema for 1 month.  EXAM: Bilateral LOWER EXTREMITY VENOUS DOPPLER  ULTRASOUND  TECHNIQUE: Gray-scale sonography with compression, as well as color and duplex ultrasound, were performed to evaluate the deep venous system(s) from the level of the common femoral vein through the popliteal and proximal calf veins.  COMPARISON:  No recent comparison imaging. Imaging from 2009 is available.  FINDINGS: VENOUS  Normal compressibility of the common femoral, superficial femoral, and popliteal veins, as well as the visualized calf veins. Visualized portions of profunda femoral vein and great saphenous vein unremarkable. No filling defects to suggest DVT on grayscale or color Doppler imaging. Doppler waveforms show normal direction of venous flow, normal respiratory plasticity and response to augmentation.  OTHER  Edema in the lower leg bilaterally.  Limitations: Limited assessment of bilateral calf veins due to presence of lower extremity, lower leg edema.  IMPRESSION: Negative for DVT. Mildly limited with respect to calf vein assessment due to lower leg edema.   Electronically Signed By: Donzetta Kohut M.D. On: 01/08/2022 11:45    Assessment & Plan:    1. BPH with obstruction/lower urinary tract symptoms Doing well on tamsulosin.  - BLADDER SCAN AMB NON-IMAGING   No follow-ups on file.  Jerilee Field, MD  [facility]  [address] [place], [place] [address] [phone]

## 2022-10-30 DIAGNOSIS — I5032 Chronic diastolic (congestive) heart failure: Secondary | ICD-10-CM | POA: Diagnosis not present

## 2022-10-30 DIAGNOSIS — Z79899 Other long term (current) drug therapy: Secondary | ICD-10-CM | POA: Diagnosis not present

## 2022-10-31 LAB — BASIC METABOLIC PANEL
BUN/Creatinine Ratio: 42 — ABNORMAL HIGH (ref 10–24)
BUN: 67 mg/dL — ABNORMAL HIGH (ref 8–27)
CO2: 25 mmol/L (ref 20–29)
Calcium: 8.7 mg/dL (ref 8.6–10.2)
Chloride: 103 mmol/L (ref 96–106)
Creatinine, Ser: 1.6 mg/dL — ABNORMAL HIGH (ref 0.76–1.27)
Glucose: 200 mg/dL — ABNORMAL HIGH (ref 70–99)
Potassium: 5 mmol/L (ref 3.5–5.2)
Sodium: 138 mmol/L (ref 134–144)
eGFR: 42 mL/min/{1.73_m2} — ABNORMAL LOW (ref >59)

## 2022-11-02 DIAGNOSIS — I509 Heart failure, unspecified: Secondary | ICD-10-CM | POA: Diagnosis not present

## 2022-11-02 DIAGNOSIS — R0602 Shortness of breath: Secondary | ICD-10-CM | POA: Diagnosis not present

## 2022-11-02 DIAGNOSIS — L8990 Pressure ulcer of unspecified site, unspecified stage: Secondary | ICD-10-CM | POA: Diagnosis not present

## 2022-11-02 DIAGNOSIS — I5031 Acute diastolic (congestive) heart failure: Secondary | ICD-10-CM | POA: Diagnosis not present

## 2022-11-03 ENCOUNTER — Encounter: Payer: Self-pay | Admitting: Nurse Practitioner

## 2022-11-03 ENCOUNTER — Ambulatory Visit: Payer: HMO | Attending: Nurse Practitioner | Admitting: Nurse Practitioner

## 2022-11-03 ENCOUNTER — Telehealth: Payer: Self-pay | Admitting: Nurse Practitioner

## 2022-11-03 VITALS — BP 110/60 | HR 128 | Ht 72.0 in

## 2022-11-03 DIAGNOSIS — N1832 Chronic kidney disease, stage 3b: Secondary | ICD-10-CM | POA: Diagnosis not present

## 2022-11-03 DIAGNOSIS — I480 Paroxysmal atrial fibrillation: Secondary | ICD-10-CM | POA: Diagnosis not present

## 2022-11-03 DIAGNOSIS — I5032 Chronic diastolic (congestive) heart failure: Secondary | ICD-10-CM | POA: Diagnosis not present

## 2022-11-03 DIAGNOSIS — I3139 Other pericardial effusion (noninflammatory): Secondary | ICD-10-CM | POA: Diagnosis not present

## 2022-11-03 DIAGNOSIS — I4892 Unspecified atrial flutter: Secondary | ICD-10-CM | POA: Diagnosis not present

## 2022-11-03 DIAGNOSIS — I2510 Atherosclerotic heart disease of native coronary artery without angina pectoris: Secondary | ICD-10-CM

## 2022-11-03 DIAGNOSIS — I350 Nonrheumatic aortic (valve) stenosis: Secondary | ICD-10-CM | POA: Diagnosis not present

## 2022-11-03 DIAGNOSIS — R600 Localized edema: Secondary | ICD-10-CM

## 2022-11-03 MED ORDER — METOPROLOL TARTRATE 25 MG PO TABS: 25.0000 mg | ORAL_TABLET | Freq: Two times a day (BID) | ORAL | 3 refills | Status: DC

## 2022-11-03 MED ORDER — METOPROLOL SUCCINATE ER 25 MG PO TB24: 25.0000 mg | ORAL_TABLET | Freq: Every day | ORAL | 2 refills | Status: DC

## 2022-11-03 NOTE — Progress Notes (Signed)
Cardiology Office Note:  .   Date:  11/03/2022  ID:  Bradley Sheppard, DOB [birth year], MRN [id number] PCP: Bradley Specking, MD  [facility] Providers Cardiologist:  Nona Dell, MD    History of Present Illness: .   Bradley Sheppard is a 82 y.o. male with a PMH of CAD, s/p DES to LAD and RCA in 2003, PAF, HFpEF, moderate aortic valve stenosis, and CKD stage 3b, who presents today for 1 month follow-up.   Last seen by Dr. Diona Sheppard on October 06, 2022. Had clinically improved from previus hospitalization for acute on chronic HFpEF exacerbation in May 2024. Echo report 09/08/2022 noted below.   Today he presents for follow-up with his wife. He states his weight is stable. Did not take Metolazone at all last week, remains compliant with Lasix. BP remains to be soft, wife thinks patient is not drinking enough liquids. Denies any chest pain, shortness of breath, syncope, presyncope, dizziness, orthopnea, PND, swelling or significant weight changes, acute bleeding, or claudication. Leg edema remains stable per wife's report. Pt notes rare episodes of palpitations, remains asymptomatic during office visit today.   Studies Reviewed: Marland Kitchen    EKG today shows A-flutter with variable AV block and nonspecific ST segment changes, 107 bpm, otherwise nothing acute.   Echo 09/2022: 1. Left ventricular ejection fraction, by estimation, is 55 to 60%. The  left ventricle has normal function. The left ventricle has no regional  wall motion abnormalities. There is severe left ventricular hypertrophy.  Left ventricular diastolic parameters   are indeterminate.   2. RV not well visualized. Grossly appears normal in size and function. .  Right ventricular systolic function was not well visualized. The right  ventricular size is not well visualized. Tricuspid regurgitation signal is  inadequate for assessing PA  pressure.   3. A small pericardial effusion is present. The pericardial effusion is  circumferential.    4. The mitral valve is normal in structure. No evidence of mitral valve  regurgitation. No evidence of mitral stenosis.   5. LVOT and AV Dopplers are technically limited, likely underestimated  values based on angle of acquisition. Consider limited study to reevaluate  aortic valve, appears to be at least some degree of stenosis. . The aortic  valve is tricuspid. There is  moderate calcification of the aortic valve. There is moderate thickening  of the aortic valve. Aortic valve regurgitation is not visualized.   6. The inferior vena cava is dilated in size with <50% respiratory  variability, suggesting right atrial pressure of 15 mmHg.  Risk Assessment/Calculations:    CHA2DS2-VASc Score = 4   This indicates a 4.8% annual risk of stroke. The patient's score is based upon: CHF History: 1 HTN History: 0 Diabetes History: 0 Stroke History: 0 Vascular Disease History: 1 Age Score: 2 Gender Score: 0  Physical Exam:   VS:  BP 110/60   Pulse (!) 128   Ht 6' (1.829 m)   SpO2 92%   BMI 26.85 kg/m    Wt Readings from Last 3 Encounters:  10/06/22 198 lb (89.8 kg)  09/10/22 202 lb 6.1 oz (91.8 kg)  05/27/22 205 lb (93 kg)    GEN: Well nourished, well developed in no acute distress NECK: No JVD; No carotid bruits CARDIAC: S1/S2, slightly fast rate and irregular rhythm, no murmurs, no rubs,  RESPIRATORY:  Clear to auscultation without rales, wheezing or rhonchi  EXTREMITIES:  Nonpitting, dependent edema to BLE (L >R); No deformity  ASSESSMENT AND PLAN: .    HFpEF, leg edema Stage C, NYHA class II-III symptoms. Unable to get standing weights per wife's report d/t generalized weakness, leg edema is stable. Believe patient is over diuresed, leading to his soft BP's. Echo 09/2022 showed EF 55-60%, will hold Metolazone at this current time. Did recommend increasing Lasix for a few days to help with leg edema, however pt and wife declined. Will stop Coreg and switch to Metoprolol  tartrate 25 mg BID. No other medication changes at this current time. At next visit, consider starting SGLT2i. Low sodium diet, fluid restriction <2L, and daily weights encouraged. Educated to contact our office for weight gain of 2 lbs overnight or 5 lbs in one week. Recommended leg elevation.   PAF/A-flutter Seldom notes palpitations. Denies any recent palpitations or tachycardia. HR 107 bpm on exam. Discussed increasing Coreg to 12.5 mg BID, however pt and wife declined. Did discuss other alternative medications, however pt and wife decline. Will switch to Metoprolol tartrate 25 mg BID, may take extra tablet daily PRN for palpitations. Has declined AC d/t hx of rectal bleeding.   CAD, s/p DES to LAD and RCA in 2003 Stable with no anginal symptoms. No indication for ischemic evaluation. Continue current medication regimen, switching Coreg to Metoprolol. Heart healthy diet and regular cardiovascular exercise encouraged.   Moderate aortic valve stenosis Hx of moderate AV stenosis in past year. Most recent Echo showed AV gradient of 14 mmHg. Plan to update Echo in May 2025 for monitoring.   5. Pericardial effusion TTE 09/2022 revealed small pericardial effusion, no evidence of cardiac tamponade. Pt denies any red flag symptoms. Will continue to monitor at this time.   6. CKD stage 3b   Most recent labs obtained showed stable kidney function. Avoid nephrotoxic agents. Continue to follow with PCP.  Dispo: Follow-up with me or APP in 2-3 weeks or sooner if anything changes.   Signed, Bradley Dory, NP

## 2022-11-03 NOTE — Telephone Encounter (Signed)
Correct medication sent to pharmacy Metoprolol tartrate 25 mg BID (twice a day

## 2022-11-03 NOTE — Patient Instructions (Addendum)
Medication Instructions:  Your physician has recommended you make the following change in your medication:  Stop carvedilol Start Metoprolol Tartrate 25 MG BID with an extra tablet daily as needed Hold Metolazone until next visit  Continue all other medications as prescribed   Labwork: none  Testing/Procedures: EKG next visit  Follow-Up: Your physician recommends that you schedule a follow-up appointment in: follow up with Philis Nettle in 2-3 weeks  Any Other Special Instructions Will Be Listed Below (If Applicable).  If you need a refill on your cardiac medications before your next appointment, please call your pharmacy.

## 2022-11-18 DIAGNOSIS — I5032 Chronic diastolic (congestive) heart failure: Secondary | ICD-10-CM | POA: Diagnosis not present

## 2022-11-18 DIAGNOSIS — E1165 Type 2 diabetes mellitus with hyperglycemia: Secondary | ICD-10-CM | POA: Diagnosis not present

## 2022-11-18 DIAGNOSIS — C884 Extranodal marginal zone B-cell lymphoma of mucosa-associated lymphoid tissue [MALT-lymphoma]: Secondary | ICD-10-CM | POA: Diagnosis not present

## 2022-11-18 DIAGNOSIS — N1832 Chronic kidney disease, stage 3b: Secondary | ICD-10-CM | POA: Diagnosis not present

## 2022-11-18 DIAGNOSIS — R600 Localized edema: Secondary | ICD-10-CM | POA: Diagnosis not present

## 2022-11-20 ENCOUNTER — Ambulatory Visit: Payer: PPO | Admitting: Pulmonary Disease

## 2022-11-24 ENCOUNTER — Ambulatory Visit: Payer: HMO | Attending: Nurse Practitioner | Admitting: Nurse Practitioner

## 2022-11-24 ENCOUNTER — Encounter: Payer: Self-pay | Admitting: Nurse Practitioner

## 2022-11-24 VITALS — BP 98/62 | HR 65 | Ht 72.0 in

## 2022-11-24 DIAGNOSIS — N1832 Chronic kidney disease, stage 3b: Secondary | ICD-10-CM | POA: Diagnosis not present

## 2022-11-24 DIAGNOSIS — R600 Localized edema: Secondary | ICD-10-CM | POA: Diagnosis not present

## 2022-11-24 DIAGNOSIS — I5032 Chronic diastolic (congestive) heart failure: Secondary | ICD-10-CM

## 2022-11-24 DIAGNOSIS — I2510 Atherosclerotic heart disease of native coronary artery without angina pectoris: Secondary | ICD-10-CM

## 2022-11-24 DIAGNOSIS — I4892 Unspecified atrial flutter: Secondary | ICD-10-CM

## 2022-11-24 DIAGNOSIS — I480 Paroxysmal atrial fibrillation: Secondary | ICD-10-CM

## 2022-11-24 DIAGNOSIS — I350 Nonrheumatic aortic (valve) stenosis: Secondary | ICD-10-CM | POA: Diagnosis not present

## 2022-11-24 DIAGNOSIS — I3139 Other pericardial effusion (noninflammatory): Secondary | ICD-10-CM | POA: Diagnosis not present

## 2022-11-24 NOTE — Progress Notes (Unsigned)
Cardiology Office Note:  .   Date: 11/24/2022 ID:  TYSHAUN VINZANT, DOB 1941-03-06, MRN [id number] PCP: [name], MD  [facility] Providers Cardiologist:  Nona Dell, MD    History of Present Illness: .   Bradley Sheppard is a 82 y.o. male with a PMH of CAD, s/p DES to LAD and RCA in 2003, PAF, HFpEF, moderate aortic valve stenosis, and CKD stage 3b, who presents today for 1 month follow-up.   Last seen by Dr. Diona Browner on October 06, 2022. Had clinically improved from previus hospitalization for acute on chronic HFpEF exacerbation in May 2024. Echo report 09/08/2022 noted below.   Today he presents for follow-up with his wife and daughter. Leg swelling appears stable. Wife believes he has not been staying well hydrated. Shows me BP readings at home that are overall well controlled and higher readings than recent office readings. Taking 2.5 mg of Metolazone twice weekly in addition to his Lasix, patient feels as though this is not making a difference in his leg swelling. Denies any chest pain, shortness of breath, syncope, presyncope, dizziness, orthopnea, PND, swelling or significant weight changes, acute bleeding, or claudication. Mainly remains sedentary per wife's report.  Studies Reviewed: Marland Kitchen    EKG is not ordered today.  Echo 09/2022: 1. Left ventricular ejection fraction, by estimation, is 55 to 60%. The  left ventricle has normal function. The left ventricle has no regional  wall motion abnormalities. There is severe left ventricular hypertrophy.  Left ventricular diastolic parameters   are indeterminate.   2. RV not well visualized. Grossly appears normal in size and function. .  Right ventricular systolic function was not well visualized. The right  ventricular size is not well visualized. Tricuspid regurgitation signal is  inadequate for assessing PA  pressure.   3. A small pericardial effusion is present. The pericardial effusion is  circumferential.   4. The  mitral valve is normal in structure. No evidence of mitral valve  regurgitation. No evidence of mitral stenosis.   5. LVOT and AV Dopplers are technically limited, likely underestimated  values based on angle of acquisition. Consider limited study to reevaluate  aortic valve, appears to be at least some degree of stenosis. . The aortic  valve is tricuspid. There is  moderate calcification of the aortic valve. There is moderate thickening  of the aortic valve. Aortic valve regurgitation is not visualized.   6. The inferior vena cava is dilated in size with <50% respiratory  variability, suggesting right atrial pressure of 15 mmHg.  Risk Assessment/Calculations:    CHA2DS2-VASc Score = 4   This indicates a 4.8% annual risk of stroke. The patient's score is based upon: CHF History: 1 HTN History: 0 Diabetes History: 0 Stroke History: 0 Vascular Disease History: 1 Age Score: 2 Gender Score: 0  Physical Exam:   VS:  BP 98/62   Pulse 65   Ht 6' (1.829 m)   SpO2 95%   BMI 26.85 kg/m    Wt Readings from Last 3 Encounters:  10/06/22 198 lb (89.8 kg)  09/10/22 202 lb 6.1 oz (91.8 kg)  05/27/22 205 lb (93 kg)    GEN: Well nourished, well developed in no acute distress NECK: No JVD; No carotid bruits CARDIAC: S1/S2, irregularly irregular rhythm, no murmurs, no rubs,  RESPIRATORY:  Clear to auscultation without rales, wheezing or rhonchi  EXTREMITIES:  Nonpitting, dependent edema to BLE (L >R); No deformity, appears stable from last visit  ASSESSMENT  AND PLAN: .    HFpEF, leg edema Stage C, NYHA class II-III symptoms. Unable to get standing weights per wife's report d/t generalized weakness, leg edema ears stable. Believe patient is over diuresed, leading to his soft BP's and not drinking enough fluids. Echo 09/2022 showed EF 55-60%, he has been restarted on metolazone at 2.5 mg twice daily.  Will obtain venous Doppler of lower extremities to rule out DVT.  If negative, stop Lasix and  switch to torsemide daily.  Continue rest of medication regimen. At next visit, consider starting SGLT2i. Low sodium diet, fluid restriction <2L, and daily weights encouraged. Educated to contact our office for weight gain of 2 lbs overnight or 5 lbs in one week. Recommended leg elevation.  May need to consider referral to lymphedema clinic if Doppler is negative for DVT as I did discuss with patient and family that I am not certain how beneficial diuretic therapy will be for patient as this appears to be a chronic issue.  Care and ED precautions discussed.  PAF/A-flutter Denies any recent palpitations or tachycardia.  Heart rate well-controlled today.  Continue Lopressor.  Has declined AC d/t hx of rectal bleeding.   CAD, s/p DES to LAD and RCA in 2003 Stable with no anginal symptoms. No indication for ischemic evaluation. Continue current medication regimen. Heart healthy diet and regular cardiovascular exercise encouraged.   Moderate aortic valve stenosis Hx of moderate AV stenosis in past year. Most recent Echo showed AV gradient of 14 mmHg. Plan to update Echo in May 2025 for monitoring.   5. Pericardial effusion TTE 09/2022 revealed small pericardial effusion, no evidence of cardiac tamponade. Pt denies any red flag symptoms. Will continue to monitor at this time.   6. CKD stage 3b   Most recent labs obtained showed stable kidney function. Avoid nephrotoxic agents. Continue to follow with PCP.  Dispo: Follow-up with me or APP in 1 month or sooner if anything changes.   Signed, Sharlene Dory, NP

## 2022-11-24 NOTE — Patient Instructions (Addendum)
Medication Instructions:  Your physician recommends that you continue on your current medications as directed. Please refer to the Current Medication list given to you today.  Labwork: none  Testing/Procedures: Your physician has requested that you have a lower or upper extremity venous duplex. This test is an ultrasound of the veins in the legs or arms. It looks at venous blood flow that carries blood from the heart to the legs or arms. Allow one hour for a Lower Venous exam. Allow thirty minutes for an Upper Venous exam. There are no restrictions or special instructions.  Follow-Up: Your physician recommends that you schedule a follow-up appointment in: 1 Month with Philis Nettle   Any Other Special Instructions Will Be Listed Below (If Applicable).  If you need a refill on your cardiac medications before your next appointment, please call your pharmacy.

## 2022-11-26 ENCOUNTER — Telehealth: Payer: Self-pay | Admitting: Cardiology

## 2022-11-26 NOTE — Telephone Encounter (Signed)
Spoke with wife regarding medication and that per Sharlene Dory no changes to medications right now. Will wait for the doppler test to be performed and resulted. Patient was scheduled for today and was not feeling well and rescheduled.

## 2022-11-26 NOTE — Telephone Encounter (Signed)
Pt c/o medication issue:  1. Name of Medication: Torsemide  2. How are you currently taking this medication (dosage and times per day)?   3. Are you having a reaction (difficulty breathing--STAT)? No  4. What is your medication issue? Patient stated he was supposed to stop taking lasix and start taking torsemide. Patient stated that the pharmacy hasn't received a prescription from [place] for the medication. Patient is requesting a call back to speak with someone about his medications.

## 2022-12-01 DIAGNOSIS — I5033 Acute on chronic diastolic (congestive) heart failure: Secondary | ICD-10-CM | POA: Diagnosis not present

## 2022-12-03 DIAGNOSIS — R0602 Shortness of breath: Secondary | ICD-10-CM | POA: Diagnosis not present

## 2022-12-03 DIAGNOSIS — I509 Heart failure, unspecified: Secondary | ICD-10-CM | POA: Diagnosis not present

## 2022-12-03 DIAGNOSIS — I5031 Acute diastolic (congestive) heart failure: Secondary | ICD-10-CM | POA: Diagnosis not present

## 2022-12-03 DIAGNOSIS — L8990 Pressure ulcer of unspecified site, unspecified stage: Secondary | ICD-10-CM | POA: Diagnosis not present

## 2022-12-07 ENCOUNTER — Ambulatory Visit: Payer: HMO | Attending: Nurse Practitioner

## 2022-12-07 DIAGNOSIS — R600 Localized edema: Secondary | ICD-10-CM

## 2022-12-08 DIAGNOSIS — I5033 Acute on chronic diastolic (congestive) heart failure: Secondary | ICD-10-CM | POA: Diagnosis not present

## 2022-12-11 ENCOUNTER — Telehealth: Payer: Self-pay

## 2022-12-11 ENCOUNTER — Telehealth: Payer: Self-pay | Admitting: Cardiology

## 2022-12-11 DIAGNOSIS — I25119 Atherosclerotic heart disease of native coronary artery with unspecified angina pectoris: Secondary | ICD-10-CM | POA: Diagnosis not present

## 2022-12-11 DIAGNOSIS — C884 Extranodal marginal zone B-cell lymphoma of mucosa-associated lymphoid tissue [MALT-lymphoma]: Secondary | ICD-10-CM | POA: Diagnosis not present

## 2022-12-11 DIAGNOSIS — Z79899 Other long term (current) drug therapy: Secondary | ICD-10-CM

## 2022-12-11 DIAGNOSIS — M7989 Other specified soft tissue disorders: Secondary | ICD-10-CM | POA: Diagnosis not present

## 2022-12-11 DIAGNOSIS — I5032 Chronic diastolic (congestive) heart failure: Secondary | ICD-10-CM | POA: Diagnosis not present

## 2022-12-11 DIAGNOSIS — M179 Osteoarthritis of knee, unspecified: Secondary | ICD-10-CM | POA: Diagnosis not present

## 2022-12-11 MED ORDER — TORSEMIDE 20 MG PO TABS: 20.0000 mg | ORAL_TABLET | Freq: Every day | ORAL | 3 refills | Status: DC

## 2022-12-11 NOTE — Telephone Encounter (Signed)
Results discussed with patient and wife,he will stop lasix and start torsemide 20 mg daily and repeat labs in 1 week.Lab slip mailed to patient.

## 2022-12-11 NOTE — Telephone Encounter (Signed)
-----   Message from Sharlene Dory sent at 12/07/2022 10:45 AM EDT ----- Doppler looks good.  No evidence of DVT in either lower extremity.  Stop Lasix.  Let's begin torsemide 20 mg daily. Please repeat BMP, proBNP, and magnesium level in 1 week.  Thanks!  Sharlene Dory, AGNP-C

## 2022-12-11 NOTE — Telephone Encounter (Signed)
I spoke with patient and wife, they never asked for an electric bed and ask I ignore the phone message. We did discuss lab results.

## 2022-12-11 NOTE — Telephone Encounter (Signed)
Patient is requesting call back to discuss orders for an electric bed. He states that it was sent to [facility] and states that he will not be able to get one until 2028, but he states that he does not currently have an electric bed currently and is unsure why they will not supply this. Please advise.

## 2022-12-14 ENCOUNTER — Telehealth: Payer: Self-pay | Admitting: Nurse Practitioner

## 2022-12-14 NOTE — Telephone Encounter (Signed)
Pt's daughter Clydie Braun is requesting a callback regarding labs being requested. Please advise

## 2022-12-14 NOTE — Telephone Encounter (Signed)
Spoke to pt's daughter who is questioning if pt can have labs drawn when he has labs drawn for  Dr. Kirtland Bouchard at [facility]. Advised that pt can have labs drawn at this time. Pts daughter had no further questions or concerns at this time.

## 2022-12-15 ENCOUNTER — Inpatient Hospital Stay: Payer: HMO

## 2022-12-15 DIAGNOSIS — E1122 Type 2 diabetes mellitus with diabetic chronic kidney disease: Secondary | ICD-10-CM | POA: Diagnosis not present

## 2022-12-15 DIAGNOSIS — I5033 Acute on chronic diastolic (congestive) heart failure: Secondary | ICD-10-CM | POA: Diagnosis not present

## 2022-12-15 DIAGNOSIS — I130 Hypertensive heart and chronic kidney disease with heart failure and stage 1 through stage 4 chronic kidney disease, or unspecified chronic kidney disease: Secondary | ICD-10-CM | POA: Diagnosis not present

## 2022-12-15 DIAGNOSIS — I480 Paroxysmal atrial fibrillation: Secondary | ICD-10-CM | POA: Diagnosis not present

## 2022-12-16 ENCOUNTER — Inpatient Hospital Stay: Payer: HMO | Attending: Hematology

## 2022-12-16 DIAGNOSIS — Z96652 Presence of left artificial knee joint: Secondary | ICD-10-CM | POA: Diagnosis not present

## 2022-12-16 DIAGNOSIS — D472 Monoclonal gammopathy: Secondary | ICD-10-CM | POA: Diagnosis not present

## 2022-12-16 DIAGNOSIS — N189 Chronic kidney disease, unspecified: Secondary | ICD-10-CM | POA: Diagnosis present

## 2022-12-16 DIAGNOSIS — R0602 Shortness of breath: Secondary | ICD-10-CM | POA: Diagnosis not present

## 2022-12-16 DIAGNOSIS — Z79899 Other long term (current) drug therapy: Secondary | ICD-10-CM

## 2022-12-16 DIAGNOSIS — M2550 Pain in unspecified joint: Secondary | ICD-10-CM | POA: Diagnosis not present

## 2022-12-16 DIAGNOSIS — D508 Other iron deficiency anemias: Secondary | ICD-10-CM

## 2022-12-16 DIAGNOSIS — R42 Dizziness and giddiness: Secondary | ICD-10-CM | POA: Insufficient documentation

## 2022-12-16 DIAGNOSIS — M7989 Other specified soft tissue disorders: Secondary | ICD-10-CM | POA: Diagnosis not present

## 2022-12-16 DIAGNOSIS — R531 Weakness: Secondary | ICD-10-CM | POA: Diagnosis not present

## 2022-12-16 DIAGNOSIS — R200 Anesthesia of skin: Secondary | ICD-10-CM | POA: Insufficient documentation

## 2022-12-16 DIAGNOSIS — D631 Anemia in chronic kidney disease: Secondary | ICD-10-CM | POA: Diagnosis not present

## 2022-12-16 DIAGNOSIS — K5900 Constipation, unspecified: Secondary | ICD-10-CM | POA: Insufficient documentation

## 2022-12-16 DIAGNOSIS — C884 Extranodal marginal zone B-cell lymphoma of mucosa-associated lymphoid tissue [MALT-lymphoma]: Secondary | ICD-10-CM | POA: Diagnosis not present

## 2022-12-16 DIAGNOSIS — F17220 Nicotine dependence, chewing tobacco, uncomplicated: Secondary | ICD-10-CM | POA: Insufficient documentation

## 2022-12-16 LAB — COMPREHENSIVE METABOLIC PANEL
ALT: 26 U/L (ref 0–44)
AST: 27 U/L (ref 15–41)
Albumin: 3.2 g/dL — ABNORMAL LOW (ref 3.5–5.0)
Alkaline Phosphatase: 101 U/L (ref 38–126)
Anion gap: 5 (ref 5–15)
BUN: 60 mg/dL — ABNORMAL HIGH (ref 8–23)
CO2: 29 mmol/L (ref 22–32)
Calcium: 8.8 mg/dL — ABNORMAL LOW (ref 8.9–10.3)
Chloride: 103 mmol/L (ref 98–111)
Creatinine, Ser: 1.65 mg/dL — ABNORMAL HIGH (ref 0.61–1.24)
GFR, Estimated: 41 mL/min — ABNORMAL LOW (ref >60)
Glucose, Bld: 123 mg/dL — ABNORMAL HIGH (ref 70–99)
Potassium: 4.3 mmol/L (ref 3.5–5.1)
Sodium: 137 mmol/L (ref 135–145)
Total Bilirubin: 0.6 mg/dL (ref 0.3–1.2)
Total Protein: 8.6 g/dL — ABNORMAL HIGH (ref 6.5–8.1)

## 2022-12-16 LAB — LACTATE DEHYDROGENASE: LDH: 154 U/L (ref 98–192)

## 2022-12-16 LAB — CBC WITH DIFFERENTIAL/PLATELET
Abs Immature Granulocytes: 0.02 10*3/uL (ref 0.00–0.07)
Basophils Absolute: 0.1 10*3/uL (ref 0.0–0.1)
Basophils Relative: 1 %
Eosinophils Absolute: 0.2 10*3/uL (ref 0.0–0.5)
Eosinophils Relative: 3 %
HCT: 38.9 % — ABNORMAL LOW (ref 39.0–52.0)
Hemoglobin: 12.6 g/dL — ABNORMAL LOW (ref 13.0–17.0)
Immature Granulocytes: 0 %
Lymphocytes Relative: 42 %
Lymphs Abs: 3.4 10*3/uL (ref 0.7–4.0)
MCH: 30.4 pg (ref 26.0–34.0)
MCHC: 32.4 g/dL (ref 30.0–36.0)
MCV: 93.7 fL (ref 80.0–100.0)
Monocytes Absolute: 0.5 10*3/uL (ref 0.1–1.0)
Monocytes Relative: 7 %
Neutro Abs: 3.8 10*3/uL (ref 1.7–7.7)
Neutrophils Relative %: 47 %
Platelets: 253 10*3/uL (ref 150–400)
RBC: 4.15 MIL/uL — ABNORMAL LOW (ref 4.22–5.81)
RDW: 14.1 % (ref 11.5–15.5)
WBC: 8 10*3/uL (ref 4.0–10.5)
nRBC: 0 % (ref 0.0–0.2)

## 2022-12-16 LAB — IRON AND TIBC
Iron: 63 ug/dL (ref 45–182)
Saturation Ratios: 26 % (ref 17.9–39.5)
TIBC: 242 ug/dL — ABNORMAL LOW (ref 250–450)
UIBC: 179 ug/dL

## 2022-12-16 LAB — BRAIN NATRIURETIC PEPTIDE: B Natriuretic Peptide: 378 pg/mL — ABNORMAL HIGH (ref 0.0–100.0)

## 2022-12-16 LAB — MAGNESIUM: Magnesium: 2.2 mg/dL (ref 1.7–2.4)

## 2022-12-16 LAB — FERRITIN: Ferritin: 201 ng/mL (ref 24–336)

## 2022-12-17 LAB — KAPPA/LAMBDA LIGHT CHAINS
Kappa free light chain: 311.2 mg/L — ABNORMAL HIGH (ref 3.3–19.4)
Kappa, lambda light chain ratio: 18.63 — ABNORMAL HIGH (ref 0.26–1.65)
Lambda free light chains: 16.7 mg/L (ref 5.7–26.3)

## 2022-12-22 ENCOUNTER — Encounter: Payer: Self-pay | Admitting: Hematology

## 2022-12-22 ENCOUNTER — Inpatient Hospital Stay: Payer: HMO | Admitting: Hematology

## 2022-12-22 VITALS — BP 92/61 | HR 54 | Temp 97.8°F | Resp 18

## 2022-12-22 DIAGNOSIS — D472 Monoclonal gammopathy: Secondary | ICD-10-CM

## 2022-12-22 DIAGNOSIS — C884 Extranodal marginal zone B-cell lymphoma of mucosa-associated lymphoid tissue [MALT-lymphoma]: Secondary | ICD-10-CM

## 2022-12-22 NOTE — Progress Notes (Signed)
[facility] [address] [place] [address]    Clinic Day:  12/22/2022  Referring physician: [name], MD  Patient Care Team: [name], MD as PCP - General (Internal Medicine) [name], MD as PCP - Cardiology (Cardiology) Doreatha Massed, MD as Medical Oncologist (Hematology) Jena Gauss Gerrit Friends, MD as Consulting Physician (Gastroenterology) [name], MD as Consulting Physician (General Surgery) McKenzie, Mardene Celeste, MD as Consulting Physician (Urology)   ASSESSMENT & PLAN:   Assessment: 1.  [name]: - Patient seen at the request of Dr. Sherril Croon - CBC on 06/12/2021 with hemoglobin 9.1, MCV 94 with normal white count and platelet count.  Creatinine was 1.4. - CBC on 05/26/2021: Hemoglobin-10, creatinine 1.56 - CBC on 02/24/2021: Hemoglobin-9.6 - He reports that he has received Feraheme in December 2020 in [place] without any major problems. - He is currently taking iron tablet twice daily.  Mild constipation. - Last colonoscopy in 2010 with polyps. - BMBX (09/02/2021): Non-Hodgkin's lymphoma, favoring MALT lymphoma.  MYD 88 was negative. - Cytogenetics: 40, XY. - Myeloma FISH panel: Negative. - FISH for low-grade lymphoma: Negative for BCL6 rearrangement, MALT1 rearrangement, t(11;14) and t(14;18).   2. Social/family history: - He lives at home with his wife.  He walks with help of walker since his left knee replacement and multiple infections following it. - He worked in [employer], [employer] and a Geophysicist/field seismologist. - He quit smoking in 1984.  But chews tobacco at this time. - No family history of cancers.    Plan: [name]: - Denies any fevers, night sweats or weight loss. - He has been at home since 08/30/2022.  He is wheelchair dependent due to leg weakness.  He is receiving physical therapy weekly. - Physical exam did not show any palpable adenopathy. - Labs showed normal LDH.  No  significant cytopenias. - No indication for further workup or treatment.  RTC 6 months for follow-up.  2.  Normocytic anemia: - Last Feraheme on 06/16/2022.  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 - Hemoglobin today is 12.6.  Ferritin is 201 and percent saturation 26.  3.  IgG kappa monoclonal gammopathy: - Previous M spike was 2.6 g.  M spike at this visit was not done.  However free light chain ratio is stable at 18.6 and kappa light chains 11.  M spike could be from MALT lymphoma.     Orders Placed This Encounter  Procedures   CBC with Differential/Platelet    Standing Status:   Future    Standing Expiration Date:   12/22/2023    Order Specific Question:   Release to patient    Answer:   Immediate   Comprehensive metabolic panel    Standing Status:   Future    Standing Expiration Date:   12/22/2023    Order Specific Question:   Release to patient    Answer:   Immediate   Protein electrophoresis, serum    Standing Status:   Future    Standing Expiration Date:   12/22/2023    Order Specific Question:   Release to patient    Answer:   Immediate   Kappa/lambda light chains    Standing Status:   Future    Standing Expiration Date:   12/22/2023   Lactate dehydrogenase    Standing Status:   Future    Standing Expiration Date:   12/22/2023    Order Specific  Question:   Release to patient    Answer:   Immediate      I,Helena R Teague,acting as a scribe for Doreatha Massed, MD.,have documented all relevant documentation on the behalf of Doreatha Massed, MD,as directed by  Doreatha Massed, MD while in the presence of Doreatha Massed, MD.  I, Doreatha Massed MD, have reviewed the above documentation for accuracy and completeness, and I agree with the above.    Doreatha Massed, MD   8/20/20246:09 PM  CHIEF COMPLAINT:   Diagnosis: MALT lymphoma, normocytic anemia, and MGUS    Cancer Staging  MALT lymphoma (HCC) Staging form: Hodgkin  and Non-Hodgkin Lymphoma, AJCC 8th Edition - Clinical stage from 09/16/2021: Stage IV (Unknown) - Unsigned    Prior Therapy: none  Current Therapy:  surveillance   HISTORY OF PRESENT ILLNESS:   Oncology History   No history exists.     INTERVAL HISTORY:   Kensley is a 82 y.o. male presenting to clinic today for follow up of MALT lymphoma, normocytic anemia, and MGUS. He was last seen by me on 09/23/22.  Today, he states that he is doing well overall. His appetite level is at 100%. His energy level is at 0%. He is accompanied by his wife and daughter.  He notes a healthy appetite. He currently lives at home and has been there since 4/28. He denies any fever, night sweats, unexpected weight loss. He is unable to stand up on his own and is either in a wheelchair or hospital bed. His feet are either erythematous and slightly purple or pale, as well as swollen.  His wife notes he gets lethargic when his blood pressure is low. He gets his blood pressure checked at home, wither by his wife or his physical therapist that comes once weekly.   PAST MEDICAL HISTORY:   Past Medical History: Past Medical History:  Diagnosis Date   (HFpEF) heart failure with preserved ejection fraction (HCC)    Anxiety    Aortic stenosis    Arthritis    Asthma    Chronic pain    CKD (chronic kidney disease) stage 3, GFR 30-59 ml/min (HCC)    Coronary atherosclerosis of native coronary artery    Multvessel, DES to LAD and RCA 8/03; EF 65% by echo 11/2014   DM2 (diabetes mellitus, type 2) (HCC)    Essential hypertension    Gastric ulcer    Gastritis    Related to NSAIDs   GERD (gastroesophageal reflux disease)    Iron deficiency anemia    Mitral valve regurgitation    Mixed hyperlipidemia    Myocardial infarction [facility])    Nephrolithiasis     Surgical History: Past Surgical History:  Procedure Laterality Date   APPENDECTOMY     BIOPSY  09/25/2021   Procedure: BIOPSY;  Surgeon: Corbin Ade, MD;   Location: [facility];  Service: Endoscopy;;   CARDIAC CATHETERIZATION  01/2002   90% obstruction in proximal LAD, 60 % obstruction in proximal circumflex and 70%  in proximal RCA. LAD & RCA stented  stents x 2   COLONOSCOPY WITH PROPOFOL N/A 09/25/2021   Procedure: COLONOSCOPY WITH PROPOFOL;  Surgeon: Corbin Ade, MD;  Location: [facility];  Service: Endoscopy;  Laterality: N/A;  11:00am   L knee replacement  2009   Subsequent infectino requiring resectino arthroplasty with incision and drainage 8/10, and reimplantizathion arthroplasty, 9/20. 5 total surgeries.   PARTIAL PROCTECTOMY BY TEM N/A 12/31/2021   Procedure: TEM PARTIAL PROTECTOMY;  Surgeon: [name], MD;  Location: [facility];  Service: General;  Laterality: N/A;    Social History: Social History   Socioeconomic History   Marital status: Married    Spouse name: Not on file   Number of children: Not on file   Years of education: Not on file   Highest education level: Not on file  Occupational History   Not on file  Tobacco Use   Smoking status: Former    Types: Cigarettes    Passive exposure: Never   Smokeless tobacco: Former    Types: Engineer, drilling   Vaping status: Never Used  Substance and Sexual Activity   Alcohol use: No   Drug use: No   Sexual activity: Not Currently  Other Topics Concern   Not on file  Social History Narrative   Full time- Designer, fashion/clothing         Social Determinants of Health   Financial Resource Strain: Low Risk  (03/27/2019)   Received from [facility], [facility]   Overall Financial Resource Strain (CARDIA)    Difficulty of Paying Living Expenses: Not hard at all  Food Insecurity: No Food Insecurity (09/05/2022)   Hunger Vital Sign    Worried About Running Out of Food in the Last Year: Never true    Ran Out of Food in the Last Year: Never true  Transportation Needs: No Transportation Needs (09/05/2022)   PRAPARE - Administrator, Civil Service (Medical): No     Lack of Transportation (Non-Medical): No  Physical Activity: Inactive (03/27/2019)   [facility] from [facility], [facility]   Exercise Vital Sign    Days of Exercise per Week: 0 days    Minutes of Exercise per Session: 0 min  Stress: No Stress Concern Present (03/27/2019)   Received from [facility], [facility] - Occupational Stress Questionnaire    Feeling of Stress : Not at all  Social Connections: Socially Isolated (03/27/2019)   Received from [facility], [facility]   Social Connection and Isolation Panel [NHANES]    Frequency of Communication with Friends and Family: Twice a week    Frequency of Social Gatherings with Friends and Family: Never    Attends Religious Services: Never    Database administrator or Organizations: No    Attends Banker Meetings: Never    Marital Status: Married  Catering manager Violence: Not At Risk (09/05/2022)   Humiliation, Afraid, Rape, and Kick questionnaire    Fear of Current or Ex-Partner: No    Emotionally Abused: No    Physically Abused: No    Sexually Abused: No    Family History: Family History  Problem Relation Age of Onset   Diabetes Mother    Aneurysm Mother        Brain   Stroke Father    Stroke Other    Colon cancer Neg Hx     Current Medications:  Current Outpatient Medications:    acetaminophen (TYLENOL) 500 MG tablet, Take 1,000 mg by mouth every 6 (six) hours as needed for moderate pain., Disp: , Rfl:    aspirin EC 81 MG tablet, Take 1 tablet (81 mg total) by mouth daily with breakfast. Swallow whole., Disp: 30 tablet, Rfl: 12   Calcium Polycarbophil (FIBER-LAX PO), Take 1 capsule by mouth daily., Disp: , Rfl:    feeding supplement (ENSURE SURGERY) LIQD, Take 237  mLs by mouth 2 (two) times daily between meals., Disp: , Rfl:    glipiZIDE (GLUCOTROL XL) 10 MG 24 hr tablet, Take 1 tablet (10 mg total) by mouth daily with breakfast. (Patient  taking differently: Take 10 mg by mouth 2 (two) times daily.), Disp: , Rfl:    insulin glargine (LANTUS) 100 UNIT/ML injection, Inject 0.1 mLs (10 Units total) into the skin daily., Disp: , Rfl:    LORazepam (ATIVAN) 0.5 MG tablet, Take 1 tablet (0.5 mg total) by mouth at bedtime as needed for anxiety or sleep., Disp: 3 tablet, Rfl: 0   metolazone (ZAROXOLYN) 5 MG tablet, Take 2.5 mg by mouth 2 (two) times a week., Disp: , Rfl:    metoprolol tartrate (LOPRESSOR) 25 MG tablet, Take 1 tablet (25 mg total) by mouth 2 (two) times daily. AND 1 EXTRA TABLET DAILY AS NEEDED., Disp: 180 tablet, Rfl: 3   nitroGLYCERIN (NITROSTAT) 0.4 MG SL tablet, Place 1 tablet (0.4 mg total) under the tongue every 5 (five) minutes as needed., Disp: 25 tablet, Rfl: 3   oxyCODONE (OXY IR/ROXICODONE) 5 MG immediate release tablet, Take 0.5-1 tablets (2.5-5 mg total) by mouth every 6 (six) hours as needed for severe pain., Disp: 5 tablet, Rfl: 0   polyethylene glycol (MIRALAX / GLYCOLAX) 17 g packet, Take 17 g by mouth 2 (two) times daily as needed for mild constipation., Disp: 14 each, Rfl: 0   potassium chloride (KLOR-CON) 10 MEQ tablet, Take 20 mEq by mouth daily as needed. Take with [employer], Disp: , Rfl:    rOPINIRole (REQUIP) 0.25 MG tablet, Take 0.25 mg by mouth at bedtime., Disp: , Rfl:    rosuvastatin (CRESTOR) 5 MG tablet, Take 5 mg by mouth every Wednesday., Disp: , Rfl:    tamsulosin (FLOMAX) 0.4 MG CAPS capsule, Take 1 capsule (0.4 mg total) by mouth daily., Disp: 90 capsule, Rfl: 3   torsemide (DEMADEX) 20 MG tablet, Take 1 tablet (20 mg total) by mouth daily., Disp: 90 tablet, Rfl: 3   vitamin B-12 (CYANOCOBALAMIN) 500 MCG tablet, Take 500 mcg by mouth daily., Disp: , Rfl:    vitamin C (ASCORBIC ACID) 500 MG tablet, Take 500 mg by mouth at bedtime., Disp: , Rfl:    Zinc Oxide 40 % PSTE, Apply 1 Application topically as needed. Apply to buttock and sacrum if silicone foam is not effective due to incontinence,  Disp: 453 g, Rfl: 1   Allergies: Allergies  Allergen Reactions   Nsaids Other (See Comments)    On blood thinners with h/o IDA/bleeding   Prednisone Other (See Comments)    "makes him feel faint", hyperglycemic     REVIEW OF SYSTEMS:   Review of Systems  Constitutional:  Negative for chills, fatigue and fever.  HENT:   Negative for lump/mass, mouth sores, nosebleeds, sore throat and trouble swallowing.   Eyes:  Negative for eye problems.  Respiratory:  Positive for shortness of breath (on exertion). Negative for cough.   Cardiovascular:  Positive for leg swelling. Negative for chest pain and palpitations.  Gastrointestinal:  Positive for constipation and diarrhea. Negative for abdominal pain, nausea and vomiting.  Genitourinary:  Negative for bladder incontinence, difficulty urinating, dysuria, frequency, hematuria and nocturia.   Musculoskeletal:  Positive for arthralgias (in the bilateral shoulers and bilateral legs, 5/10 severity). Negative for back pain, flank pain, myalgias and neck pain.  Skin:  Positive for itching (on the back). Negative for rash.  Neurological:  Positive for dizziness and numbness (fingers). Negative  for headaches.  Hematological:  Does not bruise/bleed easily.  Psychiatric/Behavioral:  Negative for depression, sleep disturbance and suicidal ideas. The patient is not nervous/anxious.   All other systems reviewed and are negative.    VITALS:   Blood pressure 92/61, pulse (!) 54, temperature 97.8 F (36.6 C), temperature source Oral, resp. rate 18, SpO2 100%.  Wt Readings from Last 3 Encounters:  10/06/22 198 lb (89.8 kg)  09/10/22 202 lb 6.1 oz (91.8 kg)  05/27/22 205 lb (93 kg)    There is no height or weight on file to calculate BMI.  Performance status (ECOG): 1 - Symptomatic but completely ambulatory  PHYSICAL EXAM:   Physical Exam Vitals and nursing note reviewed. Exam conducted with a chaperone present.  Constitutional:      Appearance:  Normal appearance.  Cardiovascular:     Rate and Rhythm: Normal rate and regular rhythm.     Pulses: Normal pulses.     Heart sounds: Normal heart sounds.  Pulmonary:     Effort: Pulmonary effort is normal.     Breath sounds: Normal breath sounds.  Abdominal:     Palpations: Abdomen is soft. There is no hepatomegaly, splenomegaly or mass.     Tenderness: There is no abdominal tenderness.  Musculoskeletal:     Right lower leg: 2+ Edema present.     Left lower leg: 2+ Edema present.  Lymphadenopathy:     Cervical: No cervical adenopathy.     Right cervical: No superficial, deep or posterior cervical adenopathy.    Left cervical: No superficial, deep or posterior cervical adenopathy.     Upper Body:     Right upper body: No supraclavicular or axillary adenopathy.     Left upper body: No supraclavicular or axillary adenopathy.  Neurological:     General: No focal deficit present.     Mental Status: He is alert and oriented to person, place, and time.  Psychiatric:        Mood and Affect: Mood normal.        Behavior: Behavior normal.     LABS:      Latest Ref Rng & Units 12/16/2022    8:58 AM 09/09/2022    4:41 AM 09/07/2022    5:20 AM  CBC  WBC 4.0 - 10.5 K/uL 8.0  8.6  6.7   Hemoglobin 13.0 - 17.0 g/dL 09.8  11.9  14.7   Hematocrit 39.0 - 52.0 % 38.9  33.3  31.6   Platelets 150 - 400 K/uL 253  177  219       Latest Ref Rng & Units 12/16/2022    8:58 AM 10/30/2022    2:06 PM 09/09/2022    4:41 AM  CMP  Glucose 70 - 99 mg/dL 829  562  130   BUN 8 - 23 mg/dL 60  67  53   Creatinine 0.61 - 1.24 mg/dL 8.65  7.84  6.96   Sodium 135 - 145 mmol/L 137  138  133   Potassium 3.5 - 5.1 mmol/L 4.3  5.0  3.9   Chloride 98 - 111 mmol/L 103  103  96   CO2 22 - 32 mmol/L 29  25  27    Calcium 8.9 - 10.3 mg/dL 8.8  8.7  8.1   Total Protein 6.5 - 8.1 g/dL 8.6     Total Bilirubin 0.3 - 1.2 mg/dL 0.6     Alkaline Phos 38 - 126 U/L 101     AST 15 -  41 U/L 27     ALT 0 - 44 U/L 26         No results found for: "CEA1", "CEA" / No results found for: "CEA1", "CEA" No results found for: "PSA1" No results found for: "CAN199" No results found for: "CAN125"  Lab Results  Component Value Date   TOTALPROTELP 7.3 07/11/2021   TOTALPROTELP 7.1 07/11/2021   ALBUMINELP 3.3 07/11/2021   A1GS 0.2 07/11/2021   A2GS 0.8 07/11/2021   BETS 0.8 07/11/2021   GAMS 1.9 (H) 07/11/2021   MSPIKE 1.4 (H) 07/11/2021   SPEI Comment 07/11/2021   Lab Results  Component Value Date   TIBC 242 (L) 12/16/2022   TIBC 261 05/27/2022   TIBC 199 (L) 01/28/2022   FERRITIN 201 12/16/2022   FERRITIN 131 05/27/2022   FERRITIN 373 (H) 01/28/2022   IRONPCTSAT 26 12/16/2022   IRONPCTSAT 13 (L) 05/27/2022   IRONPCTSAT 13 (L) 01/28/2022   Lab Results  Component Value Date   LDH 154 12/16/2022   LDH 153 05/27/2022   LDH 166 01/28/2022     STUDIES:   VAS [place] LOWER EXTREMITY VENOUS (DVT)  Result Date: 12/07/2022  Lower Venous DVT Study Patient Name:  JAEDYN DEMELLO  Date of Exam:   12/07/2022 Medical Rec #: [id number]         Accession #:    [id number] Date of Birth: Feb 24, 1941         Patient Gender: M Patient Age:   49 years Exam Location:  [place] Procedure:      VAS [place] LOWER EXTREMITY VENOUS (DVT) Referring Phys: Lanora Manis PECK --------------------------------------------------------------------------------  Indications: Edema.  Comparison Study: Prior study 01/08/2022 was negative for DVT. Performing Technologist: Dominica Severin RCS, RVS  Examination Guidelines: A complete evaluation includes B-mode imaging, spectral Doppler, color Doppler, and power Doppler as needed of all accessible portions of each vessel. Bilateral testing is considered an integral part of a complete examination. Limited examinations for reoccurring indications may be performed as noted. The reflux portion of the exam is performed with the patient in reverse Trendelenburg.   +---------+---------------+---------+-----------+----------+--------------+ RIGHT    CompressibilityPhasicitySpontaneityPropertiesThrombus Aging +---------+---------------+---------+-----------+----------+--------------+ CFV      Full           Yes      Yes                                 +---------+---------------+---------+-----------+----------+--------------+ SFJ      Full                                                        +---------+---------------+---------+-----------+----------+--------------+ FV Prox  Full                                                        +---------+---------------+---------+-----------+----------+--------------+ FV Mid   Full                                                        +---------+---------------+---------+-----------+----------+--------------+  FV DistalFull                                                        +---------+---------------+---------+-----------+----------+--------------+ PFV      Full                                                        +---------+---------------+---------+-----------+----------+--------------+ POP      Full           Yes      Yes                                 +---------+---------------+---------+-----------+----------+--------------+ PTV      Full                                                        +---------+---------------+---------+-----------+----------+--------------+ PERO     Full                                                        +---------+---------------+---------+-----------+----------+--------------+ Soleal   Full                                                        +---------+---------------+---------+-----------+----------+--------------+ Gastroc  Full                                                        +---------+---------------+---------+-----------+----------+--------------+ GSV      Full                                                         +---------+---------------+---------+-----------+----------+--------------+ SSV      Full                                                        +---------+---------------+---------+-----------+----------+--------------+   +---------+---------------+---------+-----------+----------+--------------+ LEFT     CompressibilityPhasicitySpontaneityPropertiesThrombus Aging +---------+---------------+---------+-----------+----------+--------------+ CFV      Full           Yes      Yes                                 +---------+---------------+---------+-----------+----------+--------------+  SFJ      Full                                                        +---------+---------------+---------+-----------+----------+--------------+ FV Prox  Full                                                        +---------+---------------+---------+-----------+----------+--------------+ FV Mid   Full                                                        +---------+---------------+---------+-----------+----------+--------------+ FV DistalFull                                                        +---------+---------------+---------+-----------+----------+--------------+ PFV      Full                                                        +---------+---------------+---------+-----------+----------+--------------+ POP      Full           Yes      Yes                                 +---------+---------------+---------+-----------+----------+--------------+ PTV      Full                                                        +---------+---------------+---------+-----------+----------+--------------+ PERO     Full                                                        +---------+---------------+---------+-----------+----------+--------------+ Soleal   Full                                                         +---------+---------------+---------+-----------+----------+--------------+ Gastroc  Full                                                        +---------+---------------+---------+-----------+----------+--------------+  GSV      Full                                                        +---------+---------------+---------+-----------+----------+--------------+ SSV      Full                                                        +---------+---------------+---------+-----------+----------+--------------+     Summary: BILATERAL: - No evidence of deep vein thrombosis seen in the lower extremities, bilaterally. - No evidence of superficial venous thrombosis in the lower extremities, bilaterally. -No evidence of popliteal cyst, bilaterally.  LEFT: - Findings appear essentially unchanged compared to previous examination.  *See table(s) above for measurements and observations. Electronically signed by Gerarda Fraction on 12/07/2022 at 7:46:27 PM.    Final

## 2022-12-22 NOTE — Patient Instructions (Addendum)
[facility]  Discharge Instructions  You were seen and examined today by [name].  [name] discussed your most recent lab work which revealed that everything looks good.  [name] said that if your blood pressure is low you can hold your Metoprolol (Lopressor). Keep your legs elevated when you are at home to help with the fluid in your feet and ankles.   Follow-up as scheduled in 6 months.    Thank you for choosing [facility] to provide your oncology and hematology care.   To afford each patient quality time with our provider, please arrive at least 15 minutes before your scheduled appointment time. You may need to reschedule your appointment if you arrive late (10 or more minutes). Arriving late affects you and other patients whose appointments are after yours.  Also, if you miss three or more appointments without notifying the office, you may be dismissed from the clinic at the provider's discretion.    Again, thank you for choosing [facility].  Our hope is that these requests will decrease the amount of time that you wait before being seen by our physicians.   If you have a lab appointment with the Cancer Center - please note that after April 8th, all labs will be drawn in the cancer center.  You do not have to check in or register with the main entrance as you have in the past but will complete your check-in at the cancer center.            _____________________________________________________________  Should you have questions after your visit to [facility], please contact our office at [phone] and follow the prompts.  Our office hours are 8:00 a.m. to 4:30 p.m. Monday - Thursday and 8:00 a.m. to 2:30 p.m. Friday.  Please note that voicemails left after 4:00 p.m. may not be returned until the following business day.  We are closed weekends and all major holidays.  You do have  access to a nurse 24-7, just call the main number to the clinic [phone] and do not press any options, hold on the line and a nurse will answer the phone.    For prescription refill requests, have your pharmacy contact our office and allow 72 hours.    Masks are no longer required in the cancer centers. If you would like for your care team to wear a mask while they are taking care of you, please let them know. You may have one support person who is at least 84 years old accompany you for your appointments.

## 2022-12-24 LAB — PROTEIN ELECTROPHORESIS, SERUM
A/G Ratio: 0.6 — ABNORMAL LOW (ref 0.7–1.7)
Albumin ELP: 3.3 g/dL (ref 2.9–4.4)
Alpha-1-Globulin: 0.2 g/dL (ref 0.0–0.4)
Alpha-2-Globulin: 0.8 g/dL (ref 0.4–1.0)
Beta Globulin: 0.8 g/dL (ref 0.7–1.3)
Gamma Globulin: 3.4 g/dL — ABNORMAL HIGH (ref 0.4–1.8)
Globulin, Total: 5.2 g/dL — ABNORMAL HIGH (ref 2.2–3.9)
M-Spike, %: 2.8 g/dL — ABNORMAL HIGH
Total Protein ELP: 8.5 g/dL (ref 6.0–8.5)

## 2022-12-28 ENCOUNTER — Telehealth: Payer: Self-pay | Admitting: Cardiology

## 2022-12-28 MED ORDER — BLOOD PRESSURE CUFF MISC: 1.0000 | 0 refills | Status: AC

## 2022-12-28 NOTE — Telephone Encounter (Signed)
Advised that rx for BP cuff available for pick up.

## 2022-12-28 NOTE — Telephone Encounter (Signed)
   Pt's daughter calling, she would like to ask if Dr. Diona Browner can write a prescription for a new BP cuff

## 2023-01-01 ENCOUNTER — Other Ambulatory Visit: Payer: Self-pay | Admitting: Cardiology

## 2023-01-06 ENCOUNTER — Encounter: Payer: Self-pay | Admitting: Nurse Practitioner

## 2023-01-06 ENCOUNTER — Ambulatory Visit: Payer: HMO | Admitting: Nurse Practitioner

## 2023-01-06 ENCOUNTER — Ambulatory Visit: Payer: HMO | Attending: Nurse Practitioner | Admitting: Nurse Practitioner

## 2023-01-06 VITALS — BP 100/60 | HR 100

## 2023-01-06 DIAGNOSIS — D472 Monoclonal gammopathy: Secondary | ICD-10-CM

## 2023-01-06 DIAGNOSIS — N1832 Chronic kidney disease, stage 3b: Secondary | ICD-10-CM | POA: Diagnosis not present

## 2023-01-06 DIAGNOSIS — I872 Venous insufficiency (chronic) (peripheral): Secondary | ICD-10-CM | POA: Diagnosis not present

## 2023-01-06 DIAGNOSIS — I3139 Other pericardial effusion (noninflammatory): Secondary | ICD-10-CM

## 2023-01-06 DIAGNOSIS — R531 Weakness: Secondary | ICD-10-CM

## 2023-01-06 DIAGNOSIS — D649 Anemia, unspecified: Secondary | ICD-10-CM

## 2023-01-06 DIAGNOSIS — I4892 Unspecified atrial flutter: Secondary | ICD-10-CM | POA: Diagnosis not present

## 2023-01-06 DIAGNOSIS — I350 Nonrheumatic aortic (valve) stenosis: Secondary | ICD-10-CM

## 2023-01-06 DIAGNOSIS — R002 Palpitations: Secondary | ICD-10-CM | POA: Diagnosis not present

## 2023-01-06 DIAGNOSIS — C884 Extranodal marginal zone b-cell lymphoma of mucosa-associated lymphoid tissue (malt-lymphoma) not having achieved remission: Secondary | ICD-10-CM

## 2023-01-06 DIAGNOSIS — I2510 Atherosclerotic heart disease of native coronary artery without angina pectoris: Secondary | ICD-10-CM | POA: Diagnosis not present

## 2023-01-06 DIAGNOSIS — R5383 Other fatigue: Secondary | ICD-10-CM

## 2023-01-06 DIAGNOSIS — I5032 Chronic diastolic (congestive) heart failure: Secondary | ICD-10-CM

## 2023-01-06 DIAGNOSIS — R0989 Other specified symptoms and signs involving the circulatory and respiratory systems: Secondary | ICD-10-CM | POA: Diagnosis not present

## 2023-01-06 DIAGNOSIS — I480 Paroxysmal atrial fibrillation: Secondary | ICD-10-CM

## 2023-01-06 DIAGNOSIS — R600 Localized edema: Secondary | ICD-10-CM | POA: Diagnosis not present

## 2023-01-06 DIAGNOSIS — R000 Tachycardia, unspecified: Secondary | ICD-10-CM

## 2023-01-06 MED ORDER — MIDODRINE HCL 2.5 MG PO TABS: ORAL_TABLET | ORAL | 5 refills | Status: DC

## 2023-01-06 MED ORDER — METOPROLOL TARTRATE 25 MG PO TABS: ORAL_TABLET | ORAL | 3 refills | Status: DC

## 2023-01-06 NOTE — Patient Instructions (Addendum)
Medication Instructions:  Your physician has recommended you make the following change in your medication:  Start Midodrine 2.5 Mg Twice daily with 1 extra tablet as needed if blood pressure is less than 90/60 Take Metoprolol if Blood pressure raises higher than 120/80 Continue all other medications as prescribed   Labwork: None  Testing/Procedures: None  Follow-Up: Your physician recommends that you schedule a follow-up appointment in: 4-6 weeks with Philis Nettle   Any Other Special Instructions Will Be Listed Below (If Applicable).  If you need a refill on your cardiac medications before your next appointment, please call your pharmacy.

## 2023-01-06 NOTE — Progress Notes (Signed)
Cardiology Office Note:  .   Date: 01/06/2023 ID:  Bradley Sheppard, DOB 1940/08/10, MRN [id number] PCP: [name], MD  [facility] Providers Cardiologist:  Nona Dell, MD    History of Present Illness: .   Bradley Sheppard is a 82 y.o. male with a PMH of CAD, s/p DES to LAD and RCA in 2003, PAF, HFpEF, moderate aortic valve stenosis, MALT lymphoma, anemia, MGUS, and CKD stage 3b, who presents today for 1 month follow-up.   Last seen by Dr. Diona Browner on October 06, 2022. Had clinically improved from previus hospitalization for acute on chronic HFpEF exacerbation in May 2024. Echo report 09/08/2022 noted below.   I last saw patient for follow-up with his wife and daughter on November 24, 2022. Leg swelling appeared stable. Wife believed he has not been staying well hydrated. BP overall well controlled and higher readings than recent office readings. Taking 2.5 mg of Metolazone twice weekly in addition to his Lasix, patient felt that current medication regimen was not making a difference in his leg swelling. Denied any chest pain, shortness of breath, syncope, presyncope, dizziness, orthopnea, PND, swelling or significant weight changes, acute bleeding, or claudication. Mainly sedentary per wife's report. Doppler r/o DVT. Lasix switched to Torsemide to improve leg edema.  Today he presents for follow-up with daughter and wife.  Patient tells me his chief concern is chronic fatigue and shortness of breath that seems to be getting worse over time.  Patient admits to palpitations.  States the palpitations woke him up one night and thought his wife was waking him up when she was not. Wife and patient state he has chronically slept on 2 pillows. Wife says leg edema is improved but now has more noticeable pedal edema. Brings in HR/BP log from home that show labile BP's and elevated heart rates.  Daughter reports that metoprolol has been held per instructions given by Dr. Kirtland Bouchard (oncology) at office visit on  December 22, 2022 if BP is < 120/60.  Daughter states patient took 1 tablet of metoprolol yesterday and prior to this he only took metoprolol about 7 days ago.  Heart rate has been ranging from 106-130s for the past week. BP log does show some soft/hypotensive readings.   Studies Reviewed: Marland Kitchen    EKG:  EKG Interpretation Date/Time:  Wednesday January 06 2023 15:11:58 EDT Ventricular Rate:  116 PR Interval:    QRS Duration:  108 QT Interval:  364 QTC Calculation: 505 R Axis:   -61  Text Interpretation: Atrial flutter with variable A-V block Left axis deviation Minimal voltage criteria for LVH, may be normal variant ( Cornell product ) Anterolateral infarct (cited on or before 05-Sep-2022) When compared with ECG of 05-Sep-2022 07:44, Atrial flutter has replaced Atrial fibrillation Non-specific change in ST segment in Lateral leads Confirmed by Sharlene Dory [phone]) on 01/06/2023 3:14:10 PM     Echo 09/2022: 1. Left ventricular ejection fraction, by estimation, is 55 to 60%. The  left ventricle has normal function. The left ventricle has no regional  wall motion abnormalities. There is severe left ventricular hypertrophy.  Left ventricular diastolic parameters   are indeterminate.   2. RV not well visualized. Grossly appears normal in size and function. .  Right ventricular systolic function was not well visualized. The right  ventricular size is not well visualized. Tricuspid regurgitation signal is  inadequate for assessing PA  pressure.   3. A small pericardial effusion is present. The pericardial effusion is  circumferential.   4. The mitral valve is normal in structure. No evidence of mitral valve  regurgitation. No evidence of mitral stenosis.   5. LVOT and AV Dopplers are technically limited, likely underestimated  values based on angle of acquisition. Consider limited study to reevaluate  aortic valve, appears to be at least some degree of stenosis. . The aortic  valve is  tricuspid. There is  moderate calcification of the aortic valve. There is moderate thickening  of the aortic valve. Aortic valve regurgitation is not visualized.   6. The inferior vena cava is dilated in size with <50% respiratory  variability, suggesting right atrial pressure of 15 mmHg.  Risk Assessment/Calculations:    CHA2DS2-VASc Score = 4   This indicates a 4.8% annual risk of stroke. The patient's score is based upon: CHF History: 1 HTN History: 0 Diabetes History: 0 Stroke History: 0 Vascular Disease History: 1 Age Score: 2 Gender Score: 0  Physical Exam:   VS:  BP 100/60   Pulse 100   SpO2 100%    Wt Readings from Last 3 Encounters:  10/06/22 198 lb (89.8 kg)  09/10/22 202 lb 6.1 oz (91.8 kg)  05/27/22 205 lb (93 kg)    GEN: Well nourished, well developed in no acute distress, appears tired and fatigued, sitting in wheelchair, generalized weakness NECK: No JVD; No carotid bruits CARDIAC: S1/S2, irregularly irregular rhythm and fast rhythm, no murmurs, no rubs, no gallops.  RESPIRATORY:  Clear to auscultation without rales, wheezing or rhonchi  EXTREMITIES:  Improved nonpitting edema to BLE (L >R) from previous visit, dependent/pedal edema noted to bilateral feet; No deformity, appears stable from last visit. Feet chronically cool to touch per wife's report and noted on exam, discolored with signs of venous insufficiency, capillary refill < 3 seconds bilaterally. 1+ pulses to bilateral PT. 2+ radial pulses bilaterally.  ASSESSMENT AND PLAN: .    HFpEF, leg edema, pedal edema, labile BP's, venous insufficiency Stage C, NYHA class II-III symptoms. Unable to get standing weights per wife's report d/t generalized weakness, leg edema improved on Torsemide, however wife does note some more pedal edema. Echo 09/2022 showed EF 55-60%. Metoprolol has been held due to lower BP's. Log shows some tachycardia. No DVT's seen on doppler study. Case d/w and reviewed with Dr. Diona Browner in  the office. Recommended to start midodrine so patient can take Metoprolol for better HR control. Will initiate midodrine 2.5 mg twice daily with extra tablet as needed if BP is < 90/60. Instructed to take Metoprolol tartrate if BP > 120/80. Continue metolazone, torsemide, and potassium supplement. Low sodium diet, fluid restriction <2L, and daily weights encouraged. Educated to contact our office for weight gain of 2 lbs overnight or 5 lbs in one week. Recommended leg elevation and compression stockings to improve pedal edema and for signs/symptoms of PVD along bilateral feet. GDMT limited at this time. No other medication changes at this time. Care and ED precautions discussed.  PAF/A-flutter with RVR, tachycardia, palpitations Admits to recent palpitations and tachycardia. EKG today reveals A-fib/A-flutter with RVR, 116 bpm. Metoprolol has been held d/t BP trends. Case discussed/reviewed with Dr. Diona Browner in the office. Starting midodrine as mentioned above and parameters given for Metoprolol for better BP and HR control - see above. Has declined AC d/t hx of rectal bleeding. Not a candidate for DCCV. Care and ED precautions discussed.  CAD, s/p DES to LAD and RCA in 2003 Stable with no anginal symptoms. No indication for ischemic evaluation.  Continue aspirin, rosuvastatin, and NTG PRN.  As mentioned above, parameters given for metoprolol tartrate. Heart healthy diet encouraged.   Moderate aortic valve stenosis Hx of moderate AV stenosis in past year. Most recent Echo showed AV gradient of 14 mmHg.  Case discussed and reviewed with Dr. Diona Browner in the office and recommended patient does not require Echo at this time. Plan to possibly consider updating Echo in May 2025 for monitoring depending on patient's status.  5. Pericardial effusion TTE 09/2022 revealed small pericardial effusion, no evidence of cardiac tamponade. Pt denies any red flag symptoms. Will continue to monitor at this time.   6. CKD  stage 3b Most recent labs obtained showed sCr at 1.65 with eGFR at 41. Has upcoming labs scheduled with Oncology. Avoid nephrotoxic agents. Continue to follow with PCP.  7. MGUS, anemia, [name], fatigue, weakness Patient has known diagnoses of stage IV MALT lymphoma, MGUS, and normocytic anemia. Currently following Oncology. Patient continues to display fatigue and generalized weakness since last office visit. Sitting in a wheelchair for office visit. At next office visit, plan to have in-depth patient to provider discussion and discuss/review goals of care with patient.  Dispo: Follow-up with me or APP in 4-6 weeks or sooner if anything changes.   Signed, Sharlene Dory, NP

## 2023-01-14 DIAGNOSIS — E1165 Type 2 diabetes mellitus with hyperglycemia: Secondary | ICD-10-CM | POA: Diagnosis not present

## 2023-01-14 DIAGNOSIS — N1832 Chronic kidney disease, stage 3b: Secondary | ICD-10-CM | POA: Diagnosis not present

## 2023-01-14 DIAGNOSIS — E1122 Type 2 diabetes mellitus with diabetic chronic kidney disease: Secondary | ICD-10-CM | POA: Diagnosis not present

## 2023-01-14 DIAGNOSIS — I10 Essential (primary) hypertension: Secondary | ICD-10-CM | POA: Diagnosis not present

## 2023-01-26 ENCOUNTER — Ambulatory Visit: Payer: Self-pay | Admitting: Cardiology

## 2023-02-19 NOTE — Telephone Encounter (Signed)
Home BP monitor rx never picked up so mailed to home address.

## 2023-02-22 DIAGNOSIS — N1832 Chronic kidney disease, stage 3b: Secondary | ICD-10-CM | POA: Diagnosis not present

## 2023-02-22 DIAGNOSIS — N400 Enlarged prostate without lower urinary tract symptoms: Secondary | ICD-10-CM | POA: Diagnosis not present

## 2023-02-22 DIAGNOSIS — N390 Urinary tract infection, site not specified: Secondary | ICD-10-CM | POA: Diagnosis not present

## 2023-02-26 ENCOUNTER — Ambulatory Visit: Payer: HMO | Admitting: Nurse Practitioner

## 2023-03-04 ENCOUNTER — Encounter: Payer: Self-pay | Admitting: Cardiology

## 2023-03-04 ENCOUNTER — Ambulatory Visit: Payer: HMO | Attending: Nurse Practitioner | Admitting: Cardiology

## 2023-03-04 VITALS — BP 98/62 | HR 66 | Ht 72.0 in

## 2023-03-04 DIAGNOSIS — E1165 Type 2 diabetes mellitus with hyperglycemia: Secondary | ICD-10-CM | POA: Diagnosis not present

## 2023-03-04 DIAGNOSIS — I5032 Chronic diastolic (congestive) heart failure: Secondary | ICD-10-CM | POA: Diagnosis not present

## 2023-03-04 DIAGNOSIS — I4819 Other persistent atrial fibrillation: Secondary | ICD-10-CM

## 2023-03-04 DIAGNOSIS — N1832 Chronic kidney disease, stage 3b: Secondary | ICD-10-CM | POA: Diagnosis not present

## 2023-03-04 DIAGNOSIS — M179 Osteoarthritis of knee, unspecified: Secondary | ICD-10-CM | POA: Diagnosis not present

## 2023-03-04 DIAGNOSIS — I25119 Atherosclerotic heart disease of native coronary artery with unspecified angina pectoris: Secondary | ICD-10-CM

## 2023-03-04 DIAGNOSIS — Z299 Encounter for prophylactic measures, unspecified: Secondary | ICD-10-CM | POA: Diagnosis not present

## 2023-03-04 DIAGNOSIS — I10 Essential (primary) hypertension: Secondary | ICD-10-CM | POA: Diagnosis not present

## 2023-03-04 MED ORDER — MIDODRINE HCL 5 MG PO TABS: 5.0000 mg | ORAL_TABLET | Freq: Two times a day (BID) | ORAL | 1 refills | Status: DC

## 2023-03-04 NOTE — Progress Notes (Signed)
Cardiology Office Note  Date: 03/04/2023   ID: Bradley Sheppard, Bradley Sheppard 1941-04-08, MRN [id number]  History of Present Illness: Bradley Sheppard is an 82 y.o. male last seen in September by Ms. Philis Nettle NP, I reviewed the note.  He is here today with his wife and daughter.  Leg edema is generally better although he has more distal venous stasis and foot swelling/lymphedema.  We went over his home blood pressure and heart rate checks.  Still relatively hypotensive although trend is somewhat better since starting low-dose midodrine.  He is not taking Lopressor at all at this time, although his heart rates are not as fast in atrial fibrillation, 90s to low 100s.  He is in a wheelchair today, still with limited mobility.  Chronically fatigued and short of breath as before.  I reviewed his medications.  Current cardiac regimen includes aspirin, ProAmatine, Lopressor, Zaroxolyn, nitroglycerin, Crestor, Demadex, and potassium supplement.  Today we discussed palliative care consultation to do home assessment and see if he has any other needs that could be addressed.  We will continue to work on treating his conditions in the meanwhile.  Physical Exam: VS:  BP 98/62 (BP Location: Left Arm, Patient Position: Sitting, Cuff Size: Normal)   Pulse 66   Ht 6' (1.829 m)   SpO2 100%   BMI 26.85 kg/m , BMI Body mass index is 26.85 kg/m.  Wt Readings from Last 3 Encounters:  10/06/22 198 lb (89.8 kg)  09/10/22 202 lb 6.1 oz (91.8 kg)  05/27/22 205 lb (93 kg)    General: Patient appears comfortable at rest. HEENT: Conjunctiva and lids normal. Neck: Supple, no elevated JVP or carotid bruits. Lungs: Clear to auscultation, nonlabored breathing at rest. Cardiac: Irregularly irregular, no gallop, 2/6 systolic murmur. Abdomen: Soft, bowel sounds present. Extremities: Distal lymphedema and venous stasis, pitting edema in lower legs has improved..  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  Labwork: February 2024: Cholesterol 131, triglycerides 106, HDL 35, LDL 76 12/16/2022: ALT 26; AST 27; B Natriuretic Peptide 378.0; BUN 60; Creatinine, Ser 1.65; Hemoglobin 12.6; Magnesium 2.2; Platelets 253; Potassium 4.3; Sodium 137   Other Studies Reviewed Today:  Echocardiogram 09/05/2022:  1. Left ventricular ejection fraction, by estimation, is 55 to 60%. The  left ventricle has normal function. The left ventricle has no regional  wall motion abnormalities. There is severe left ventricular hypertrophy.  Left ventricular diastolic parameters   are indeterminate.   2. RV not well visualized. Grossly appears normal in size and function. .  Right ventricular systolic function was not well visualized. The right  ventricular size is not well visualized. Tricuspid regurgitation signal is  inadequate for assessing PA  pressure.   3. A small pericardial effusion is present. The pericardial effusion is  circumferential.   4. The mitral valve is normal in structure. No evidence of mitral valve  regurgitation. No evidence of mitral stenosis.   5. LVOT and AV Dopplers are technically limited, likely underestimated  values based on angle of acquisition. Consider limited study to reevaluate  aortic valve, appears to be at least some degree of stenosis. . The aortic  valve is tricuspid. There is  moderate calcification of the aortic valve. There is moderate thickening  of the aortic valve. Aortic valve regurgitation is not visualized.   6. The inferior vena cava is dilated  in size with <50% respiratory  variability, suggesting right atrial pressure of 15 mmHg.   Assessment and Plan:  1.  CAD status post DES to the LAD and RCA in 2003.  No obvious angina with low-level activity.  Currently on aspirin and Crestor.   2.  HFpEF, LVEF 55 to 60% with severe LVH by  echocardiogram in May.  Leg edema generally better although he does have more distal venous stasis and lymphedema.  Discussed using Ace wraps, continue with present diuretic regimen including Demadex with potassium supplement and twice weekly Zaroxolyn.   3.  Paroxysmal to persistent atrial fibrillation with CHA2DS2-VASc score of 5.  Not anticoagulated with history of recurrent rectal bleeding.  He is not on any AV nodal blockers at this time.  Heart rate trend is better, we will continue to focus on improving his blood pressure and then use Lopressor only on an as-needed basis.   4.  Degenerative calcific aortic stenosis, overall moderate range by testing over the last year.  Most recent echocardiogram demonstrated mean AV gradient of approximately 14 mmHg and dimensionless index 0.32.  Likely paradoxical normal flow/low gradient.  Following conservatively at this point.   5.  CKD stage IIIb, recent creatinine 1.65.  6.  Relative hypotension, gradually improving since initiation of midodrine which will be increased to 5 mg twice daily.  7.  Discussed palliative care consultation with home assessment.  Disposition:  Follow up  6 weeks.  Signed, [name], M.D., F.A.C.C. [facility]

## 2023-03-04 NOTE — Patient Instructions (Addendum)
Medication Instructions:  INCREASE Midodrine to 5 mg twice a day  Labwork: None today  Testing/Procedures: None today  Follow-Up: 6 weeks  Any Other Special Instructions Will Be Listed Below (If Applicable).  You have been referred to Palliative Care. They will call you to schedule appointment.   If you need a refill on your cardiac medications before your next appointment, please call your pharmacy.

## 2023-03-04 NOTE — Addendum Note (Signed)
Addended by: Marlyn Corporal A on: 03/04/2023 09:42 AM   Modules accepted: Orders

## 2023-03-09 ENCOUNTER — Ambulatory Visit: Payer: HMO | Admitting: Cardiology

## 2023-03-12 DIAGNOSIS — E1165 Type 2 diabetes mellitus with hyperglycemia: Secondary | ICD-10-CM | POA: Diagnosis not present

## 2023-04-11 DIAGNOSIS — E1165 Type 2 diabetes mellitus with hyperglycemia: Secondary | ICD-10-CM | POA: Diagnosis not present

## 2023-04-16 ENCOUNTER — Ambulatory Visit: Payer: HMO | Admitting: Nurse Practitioner

## 2023-05-06 ENCOUNTER — Telehealth: Payer: Self-pay

## 2023-05-06 ENCOUNTER — Other Ambulatory Visit: Payer: Self-pay

## 2023-05-06 DIAGNOSIS — N138 Other obstructive and reflux uropathy: Secondary | ICD-10-CM

## 2023-05-06 MED ORDER — TAMSULOSIN HCL 0.4 MG PO CAPS: 0.4000 mg | ORAL_CAPSULE | Freq: Every day | ORAL | 1 refills | Status: AC

## 2023-05-06 MED ORDER — TAMSULOSIN HCL 0.4 MG PO CAPS: 0.4000 mg | ORAL_CAPSULE | Freq: Every day | ORAL | 0 refills | Status: DC

## 2023-05-24 ENCOUNTER — Ambulatory Visit: Payer: HMO | Admitting: Urology

## 2023-05-25 NOTE — Telephone Encounter (Signed)
My chart message sent 2 weeks ago

## 2023-05-28 ENCOUNTER — Ambulatory Visit: Payer: HMO | Attending: Nurse Practitioner | Admitting: Nurse Practitioner

## 2023-05-28 ENCOUNTER — Encounter: Payer: Self-pay | Admitting: Nurse Practitioner

## 2023-05-28 VITALS — BP 118/52 | HR 90 | Ht 72.0 in | Wt 215.0 lb

## 2023-05-28 DIAGNOSIS — I350 Nonrheumatic aortic (valve) stenosis: Secondary | ICD-10-CM

## 2023-05-28 DIAGNOSIS — D472 Monoclonal gammopathy: Secondary | ICD-10-CM | POA: Diagnosis not present

## 2023-05-28 DIAGNOSIS — C884 Extranodal marginal zone b-cell lymphoma of mucosa-associated lymphoid tissue (malt-lymphoma) not having achieved remission: Secondary | ICD-10-CM

## 2023-05-28 DIAGNOSIS — I700 Atherosclerosis of aorta: Secondary | ICD-10-CM | POA: Diagnosis not present

## 2023-05-28 DIAGNOSIS — E1122 Type 2 diabetes mellitus with diabetic chronic kidney disease: Secondary | ICD-10-CM | POA: Diagnosis not present

## 2023-05-28 DIAGNOSIS — I5032 Chronic diastolic (congestive) heart failure: Secondary | ICD-10-CM

## 2023-05-28 DIAGNOSIS — D649 Anemia, unspecified: Secondary | ICD-10-CM

## 2023-05-28 DIAGNOSIS — R0989 Other specified symptoms and signs involving the circulatory and respiratory systems: Secondary | ICD-10-CM

## 2023-05-28 DIAGNOSIS — Z299 Encounter for prophylactic measures, unspecified: Secondary | ICD-10-CM | POA: Diagnosis not present

## 2023-05-28 DIAGNOSIS — I25119 Atherosclerotic heart disease of native coronary artery with unspecified angina pectoris: Secondary | ICD-10-CM | POA: Diagnosis not present

## 2023-05-28 DIAGNOSIS — R531 Weakness: Secondary | ICD-10-CM

## 2023-05-28 DIAGNOSIS — R600 Localized edema: Secondary | ICD-10-CM

## 2023-05-28 DIAGNOSIS — I480 Paroxysmal atrial fibrillation: Secondary | ICD-10-CM | POA: Diagnosis not present

## 2023-05-28 DIAGNOSIS — I3139 Other pericardial effusion (noninflammatory): Secondary | ICD-10-CM

## 2023-05-28 DIAGNOSIS — I4892 Unspecified atrial flutter: Secondary | ICD-10-CM | POA: Diagnosis not present

## 2023-05-28 DIAGNOSIS — I872 Venous insufficiency (chronic) (peripheral): Secondary | ICD-10-CM | POA: Diagnosis not present

## 2023-05-28 DIAGNOSIS — R5383 Other fatigue: Secondary | ICD-10-CM

## 2023-05-28 DIAGNOSIS — N1832 Chronic kidney disease, stage 3b: Secondary | ICD-10-CM | POA: Diagnosis not present

## 2023-05-28 DIAGNOSIS — I2510 Atherosclerotic heart disease of native coronary artery without angina pectoris: Secondary | ICD-10-CM

## 2023-05-28 DIAGNOSIS — Z79899 Other long term (current) drug therapy: Secondary | ICD-10-CM | POA: Diagnosis not present

## 2023-05-28 DIAGNOSIS — E1165 Type 2 diabetes mellitus with hyperglycemia: Secondary | ICD-10-CM | POA: Diagnosis not present

## 2023-05-28 DIAGNOSIS — I10 Essential (primary) hypertension: Secondary | ICD-10-CM | POA: Diagnosis not present

## 2023-05-28 MED ORDER — MIDODRINE HCL 5 MG PO TABS: 5.0000 mg | ORAL_TABLET | Freq: Three times a day (TID) | ORAL | 1 refills | Status: DC

## 2023-05-28 NOTE — Progress Notes (Unsigned)
Cardiology Office Note:  .   Date: 01/06/2023 ID:  Bradley Sheppard, DOB 1940-10-29, MRN [id number] PCP: Bradley Specking, MD   [facility]Care Providers Cardiologist:  Bradley Dell, MD    History of Present Illness: .   Bradley Sheppard is a 83 y.o. male with a PMH of CAD, s/p DES to LAD and RCA in 2003, PAF, HFpEF, moderate aortic valve stenosis, MALT lymphoma, anemia, MGUS, and CKD stage 3b, who presents today for 1 month follow-up.   Last seen by Dr. Diona Sheppard on October 06, 2022. Had clinically improved from previus hospitalization for acute on chronic HFpEF exacerbation in May 2024. Echo report 09/08/2022 noted below.   I last saw patient for follow-up with his wife and daughter on November 24, 2022. Leg swelling appeared stable. Wife believed he has not been staying well hydrated. BP overall well controlled and higher readings than recent office readings. Taking 2.5 mg of Metolazone twice weekly in addition to his Lasix, patient felt that current medication regimen was not making a difference in his leg swelling. Denied any chest pain, shortness of breath, syncope, presyncope, dizziness, orthopnea, PND, swelling or significant weight changes, acute bleeding, or claudication. Mainly sedentary per wife's report. Doppler r/o DVT. Lasix switched to Torsemide to improve leg edema.  Today he presents for follow-up with daughter and wife.  Patient tells me his chief concern is chronic fatigue and shortness of breath that seems to be getting worse over time.  Patient admits to palpitations.  States the palpitations woke him up one night and thought his wife was waking him up when she was not. Wife and patient state he has chronically slept on 2 pillows. Wife says leg edema is improved but now has more noticeable pedal edema. Brings in HR/BP log from home that show labile BP's and elevated heart rates.  Daughter reports that metoprolol has been held per instructions given by Dr. Kirtland Sheppard (oncology) at office visit on  December 22, 2022 if BP is < 120/60.  Daughter states patient took 1 tablet of metoprolol yesterday and prior to this he only took metoprolol about 7 days ago.  Heart rate has been ranging from 106-130s for the past week. BP log does show some soft/hypotensive readings.   Heart rate ranging from 67-1 34, blood pressure soft with SBP dropping to 85/70, 87/66, and 98/66.   Studies Reviewed: Marland Kitchen    EKG:        Echo 09/2022: 1. Left ventricular ejection fraction, by estimation, is 55 to 60%. The  left ventricle has normal function. The left ventricle has no regional  wall motion abnormalities. There is severe left ventricular hypertrophy.  Left ventricular diastolic parameters   are indeterminate.   2. RV not well visualized. Grossly appears normal in size and function. .  Right ventricular systolic function was not well visualized. The right  ventricular size is not well visualized. Tricuspid regurgitation signal is  inadequate for assessing PA  pressure.   3. A small pericardial effusion is present. The pericardial effusion is  circumferential.   4. The mitral valve is normal in structure. No evidence of mitral valve  regurgitation. No evidence of mitral stenosis.   5. LVOT and AV Dopplers are technically limited, likely underestimated  values based on angle of acquisition. Consider limited study to reevaluate  aortic valve, appears to be at least some degree of stenosis. . The aortic  valve is tricuspid. There is  moderate calcification of the aortic valve. There  is moderate thickening  of the aortic valve. Aortic valve regurgitation is not visualized.   6. The inferior vena cava is dilated in size with <50% respiratory  variability, suggesting right atrial pressure of 15 mmHg.  Risk Assessment/Calculations:    CHA2DS2-VASc Score = 4   This indicates a 4.8% annual risk of stroke. The patient's score is based upon: CHF History: 1 HTN History: 0 Diabetes History: 0 Stroke History:  0 Vascular Disease History: 1 Age Score: 2 Gender Score: 0  Physical Exam:   VS:  There were no vitals taken for this visit.   Wt Readings from Last 3 Encounters:  10/06/22 198 lb (89.8 kg)  09/10/22 202 lb 6.1 oz (91.8 kg)  05/27/22 205 lb (93 kg)    GEN: Well nourished, well developed in no acute distress, appears tired and fatigued, sitting in wheelchair, generalized weakness NECK: No JVD; No carotid bruits CARDIAC: S1/S2, irregularly irregular rhythm and fast rhythm, no murmurs, no rubs, no gallops.  RESPIRATORY:  Clear to auscultation without rales, wheezing or rhonchi  EXTREMITIES:  Improved nonpitting edema to BLE (L >R) from previous visit, dependent/pedal edema noted to bilateral feet; No deformity, appears stable from last visit. Feet chronically cool to touch per wife's report and noted on exam, discolored with signs of venous insufficiency, capillary refill < 3 seconds bilaterally. 1+ pulses to bilateral PT. 2+ radial pulses bilaterally.  ASSESSMENT AND PLAN: .    HFpEF, leg edema, pedal edema, labile BP's, venous insufficiency Stage C, NYHA class II-III symptoms. Unable to get standing weights per wife's report d/t generalized weakness, leg edema improved on Torsemide, however wife does note some more pedal edema. Echo 09/2022 showed EF 55-60%. Metoprolol has been held due to lower BP's. Log shows some tachycardia. No DVT's seen on doppler study. Case d/w and reviewed with Dr. Diona Sheppard in the office. Recommended to start midodrine so patient can take Metoprolol for better HR control. Will initiate midodrine 2.5 mg twice daily with extra tablet as needed if BP is < 90/60. Instructed to take Metoprolol tartrate if BP > 120/80. Continue metolazone, torsemide, and potassium supplement. Low sodium diet, fluid restriction <2L, and daily weights encouraged. Educated to contact our office for weight gain of 2 lbs overnight or 5 lbs in one week. Recommended leg elevation and compression  stockings to improve pedal edema and for signs/symptoms of PVD along bilateral feet. GDMT limited at this time. No other medication changes at this time. Care and ED precautions discussed.  PAF/A-flutter with RVR, tachycardia, palpitations Admits to recent palpitations and tachycardia. EKG today reveals A-fib/A-flutter with RVR, 116 bpm. Metoprolol has been held d/t BP trends. Case discussed/reviewed with Dr. Diona Sheppard in the office. Starting midodrine as mentioned above and parameters given for Metoprolol for better BP and HR control - see above. Has declined AC d/t hx of rectal bleeding. Not a candidate for DCCV. Care and ED precautions discussed.  CAD, s/p DES to LAD and RCA in 2003 Stable with no anginal symptoms. No indication for ischemic evaluation. Continue aspirin, rosuvastatin, and NTG PRN.  As mentioned above, parameters given for metoprolol tartrate. Heart healthy diet encouraged.   Moderate aortic valve stenosis Hx of moderate AV stenosis in past year. Most recent Echo showed AV gradient of 14 mmHg.  Case discussed and reviewed with Dr. Diona Sheppard in the office and recommended patient does not require Echo at this time. Plan to possibly consider updating Echo in May 2025 for monitoring depending on patient's status.  5. Pericardial effusion TTE 09/2022 revealed small pericardial effusion, no evidence of cardiac tamponade. Pt denies any red flag symptoms. Will continue to monitor at this time.   6. CKD stage 3b Most recent labs obtained showed sCr at 1.65 with eGFR at 41. Has upcoming labs scheduled with Oncology. Avoid nephrotoxic agents. Continue to follow with PCP.  7. MGUS, anemia, [name], fatigue, weakness Patient has known diagnoses of stage IV MALT lymphoma, MGUS, and normocytic anemia. Currently following Oncology. Patient continues to display fatigue and generalized weakness since last office visit. Sitting in a wheelchair for office visit. At next  office visit, plan to have in-depth patient to provider discussion and discuss/review goals of care with patient.  Dispo: Follow-up with me or APP in 4-6 weeks or sooner if anything changes.   Signed, Sharlene Dory, NP

## 2023-05-28 NOTE — Patient Instructions (Addendum)
Medication Instructions:  Your physician has recommended you make the following change in your medication:  Increase Midodrine 5 mg from twice daily to three times daily Continue taking all other medications as prescribed  Labwork: CBC, CMET, Magnesium  and Fasting lipid panel at [facility] or [facility] in [place] in 1-2 weeks  Testing/Procedures: None  Follow-Up: Your physician recommends that you schedule a follow-up appointment in: 6-8 weeks  Any Other Special Instructions Will Be Listed Below (If Applicable). Referral to Palliative Care  Thank you for choosing [facility]!     If you need a refill on your cardiac medications before your next appointment, please call your pharmacy.

## 2023-05-31 ENCOUNTER — Encounter: Payer: Self-pay | Admitting: Hematology

## 2023-06-16 ENCOUNTER — Inpatient Hospital Stay: Payer: HMO

## 2023-06-17 ENCOUNTER — Other Ambulatory Visit: Payer: Self-pay

## 2023-06-17 ENCOUNTER — Inpatient Hospital Stay: Payer: HMO | Attending: Hematology

## 2023-06-17 DIAGNOSIS — R21 Rash and other nonspecific skin eruption: Secondary | ICD-10-CM | POA: Diagnosis not present

## 2023-06-17 DIAGNOSIS — Z96652 Presence of left artificial knee joint: Secondary | ICD-10-CM | POA: Insufficient documentation

## 2023-06-17 DIAGNOSIS — Z87891 Personal history of nicotine dependence: Secondary | ICD-10-CM | POA: Diagnosis not present

## 2023-06-17 DIAGNOSIS — D472 Monoclonal gammopathy: Secondary | ICD-10-CM | POA: Insufficient documentation

## 2023-06-17 DIAGNOSIS — Z862 Personal history of diseases of the blood and blood-forming organs and certain disorders involving the immune mechanism: Secondary | ICD-10-CM | POA: Diagnosis not present

## 2023-06-17 DIAGNOSIS — Z79899 Other long term (current) drug therapy: Secondary | ICD-10-CM | POA: Diagnosis not present

## 2023-06-17 DIAGNOSIS — R0602 Shortness of breath: Secondary | ICD-10-CM | POA: Insufficient documentation

## 2023-06-17 DIAGNOSIS — C884 Extranodal marginal zone b-cell lymphoma of mucosa-associated lymphoid tissue (malt-lymphoma) not having achieved remission: Secondary | ICD-10-CM | POA: Diagnosis not present

## 2023-06-17 DIAGNOSIS — R079 Chest pain, unspecified: Secondary | ICD-10-CM | POA: Diagnosis not present

## 2023-06-17 DIAGNOSIS — R531 Weakness: Secondary | ICD-10-CM | POA: Diagnosis not present

## 2023-06-17 DIAGNOSIS — Z993 Dependence on wheelchair: Secondary | ICD-10-CM | POA: Insufficient documentation

## 2023-06-17 DIAGNOSIS — R600 Localized edema: Secondary | ICD-10-CM | POA: Diagnosis not present

## 2023-06-17 DIAGNOSIS — K5900 Constipation, unspecified: Secondary | ICD-10-CM | POA: Insufficient documentation

## 2023-06-17 LAB — CBC WITH DIFFERENTIAL/PLATELET
Abs Immature Granulocytes: 0.02 10*3/uL (ref 0.00–0.07)
Basophils Absolute: 0 10*3/uL (ref 0.0–0.1)
Basophils Relative: 1 %
Eosinophils Absolute: 0.1 10*3/uL (ref 0.0–0.5)
Eosinophils Relative: 2 %
HCT: 39.7 % (ref 39.0–52.0)
Hemoglobin: 13.4 g/dL (ref 13.0–17.0)
Immature Granulocytes: 0 %
Lymphocytes Relative: 36 %
Lymphs Abs: 2.9 10*3/uL (ref 0.7–4.0)
MCH: 31.9 pg (ref 26.0–34.0)
MCHC: 33.8 g/dL (ref 30.0–36.0)
MCV: 94.5 fL (ref 80.0–100.0)
Monocytes Absolute: 0.5 10*3/uL (ref 0.1–1.0)
Monocytes Relative: 7 %
Neutro Abs: 4.4 10*3/uL (ref 1.7–7.7)
Neutrophils Relative %: 54 %
Platelets: 229 10*3/uL (ref 150–400)
RBC: 4.2 MIL/uL — ABNORMAL LOW (ref 4.22–5.81)
RDW: 13 % (ref 11.5–15.5)
WBC: 8.1 10*3/uL (ref 4.0–10.5)
nRBC: 0 % (ref 0.0–0.2)

## 2023-06-17 LAB — LIPID PANEL
Cholesterol: 183 mg/dL (ref 0–200)
HDL: 33 mg/dL — ABNORMAL LOW (ref >40)
LDL Cholesterol: 120 mg/dL — ABNORMAL HIGH (ref 0–99)
Total CHOL/HDL Ratio: 5.5 {ratio}
Triglycerides: 148 mg/dL (ref <150)
VLDL: 30 mg/dL (ref 0–40)

## 2023-06-17 LAB — COMPREHENSIVE METABOLIC PANEL
ALT: 19 U/L (ref 0–44)
AST: 22 U/L (ref 15–41)
Albumin: 3.2 g/dL — ABNORMAL LOW (ref 3.5–5.0)
Alkaline Phosphatase: 100 U/L (ref 38–126)
Anion gap: 9 (ref 5–15)
BUN: 79 mg/dL — ABNORMAL HIGH (ref 8–23)
CO2: 27 mmol/L (ref 22–32)
Calcium: 9.1 mg/dL (ref 8.9–10.3)
Chloride: 101 mmol/L (ref 98–111)
Creatinine, Ser: 2.13 mg/dL — ABNORMAL HIGH (ref 0.61–1.24)
GFR, Estimated: 30 mL/min — ABNORMAL LOW (ref >60)
Glucose, Bld: 195 mg/dL — ABNORMAL HIGH (ref 70–99)
Potassium: 4.3 mmol/L (ref 3.5–5.1)
Sodium: 137 mmol/L (ref 135–145)
Total Bilirubin: 0.7 mg/dL (ref 0.0–1.2)
Total Protein: 8.7 g/dL — ABNORMAL HIGH (ref 6.5–8.1)

## 2023-06-17 LAB — MAGNESIUM: Magnesium: 2.2 mg/dL (ref 1.7–2.4)

## 2023-06-17 LAB — LACTATE DEHYDROGENASE: LDH: 177 U/L (ref 98–192)

## 2023-06-18 LAB — KAPPA/LAMBDA LIGHT CHAINS
Kappa free light chain: 365.1 mg/L — ABNORMAL HIGH (ref 3.3–19.4)
Kappa, lambda light chain ratio: 21.99 — ABNORMAL HIGH (ref 0.26–1.65)
Lambda free light chains: 16.6 mg/L (ref 5.7–26.3)

## 2023-06-21 LAB — PROTEIN ELECTROPHORESIS, SERUM
A/G Ratio: 0.7 (ref 0.7–1.7)
Albumin ELP: 3.5 g/dL (ref 2.9–4.4)
Alpha-1-Globulin: 0.2 g/dL (ref 0.0–0.4)
Alpha-2-Globulin: 0.8 g/dL (ref 0.4–1.0)
Beta Globulin: 0.8 g/dL (ref 0.7–1.3)
Gamma Globulin: 3.5 g/dL — ABNORMAL HIGH (ref 0.4–1.8)
Globulin, Total: 5.2 g/dL — ABNORMAL HIGH (ref 2.2–3.9)
M-Spike, %: 3.2 g/dL — ABNORMAL HIGH
Total Protein ELP: 8.7 g/dL — ABNORMAL HIGH (ref 6.0–8.5)

## 2023-06-22 ENCOUNTER — Inpatient Hospital Stay (HOSPITAL_BASED_OUTPATIENT_CLINIC_OR_DEPARTMENT_OTHER): Payer: HMO | Admitting: Hematology

## 2023-06-22 VITALS — BP 84/54 | HR 83 | Temp 97.5°F | Resp 18

## 2023-06-22 DIAGNOSIS — C884 Extranodal marginal zone b-cell lymphoma of mucosa-associated lymphoid tissue (malt-lymphoma) not having achieved remission: Secondary | ICD-10-CM | POA: Diagnosis not present

## 2023-06-22 DIAGNOSIS — D472 Monoclonal gammopathy: Secondary | ICD-10-CM

## 2023-06-22 DIAGNOSIS — D508 Other iron deficiency anemias: Secondary | ICD-10-CM

## 2023-06-22 NOTE — Progress Notes (Signed)
 [facility] [address] [place] [address]    Clinic Day:  06/22/2023  Referring physician: [name], MD  Patient Care Team: [name], MD as PCP - General (Internal Medicine) [name], MD as PCP - Cardiology (Cardiology) Doreatha Massed, MD as Medical Oncologist (Hematology) Corbin Ade, MD as Consulting Physician (Gastroenterology) [name], MD as Consulting Physician (General Surgery) McKenzie, Mardene Celeste, MD as Consulting Physician (Urology)   ASSESSMENT & PLAN:   Assessment: 1.  [name]: - Patient seen at the request of Dr. Sherril Croon - CBC on 06/12/2021 with hemoglobin 9.1, MCV 94 with normal white count and platelet count.  Creatinine was 1.4. - CBC on 05/26/2021: Hemoglobin-10, creatinine 1.56 - CBC on 02/24/2021: Hemoglobin-9.6 - He reports that he has received Feraheme in December 2020 in [place] without any major problems. - He is currently taking iron tablet twice daily.  Mild constipation. - Last colonoscopy in 2010 with polyps. - BMBX (09/02/2021): Non-Hodgkin's lymphoma, favoring MALT lymphoma.  MYD 88 was negative. - Cytogenetics: 73, XY. - Myeloma FISH panel: Negative. - FISH for low-grade lymphoma: Negative for BCL6 rearrangement, MALT1 rearrangement, t(11;14) and t(14;18).   2. Social/family history: - He lives at home with his wife.  He walks with help of walker since his left knee replacement and multiple infections following it. - He worked in [employer], [employer] and a Geophysicist/field seismologist. - He quit smoking in 1984.  But chews tobacco at this time. - No family history of cancers.    Plan: [name]: - Denies any fevers, night sweats.  He has gained weight from fluid retention. - Denies any recurrent infections. - Labs from 06/17/2023: White count and platelet count is normal.  Creatinine is stable at 2.1.  LDH is normal. - He does not have any B symptoms or  cytopenias to recommend treatment.  Recommend follow-up in 6 months with repeat labs and exam.  If he needs treatment, will consider single agent rituximab.  2.  Normocytic anemia: - Last Feraheme was in February 2004.  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  Last ferritin was 201.  Hemoglobin today is normal at 13.4.  3.  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 - M spike is 3.2, previously 2.8 g.  FLC ratio is 21.99, previously 18.63.  Kappa light chains are 365, up from 311.     Orders Placed This Encounter  Procedures   CBC with Differential    Standing Status:   Future    Expected Date:   12/13/2023    Expiration Date:   06/21/2024   Comprehensive metabolic panel    Standing Status:   Future    Expected Date:   12/13/2023    Expiration Date:   06/21/2024   Magnesium    Standing Status:   Future    Expected Date:   12/13/2023    Expiration Date:   06/21/2024   Kappa/lambda light chains    Standing Status:   Future    Expected Date:   12/13/2023    Expiration Date:   06/21/2024   Protein electrophoresis, serum    Standing Status:   Future    Expected Date:   12/13/2023    Expiration Date:   06/21/2024   Lactate dehydrogenase    Standing Status:   Future    Expected Date:   12/13/2023  Expiration Date:   06/21/2024   Iron and TIBC ([facility] ONLY)    Standing Status:   Future    Expected Date:   12/13/2023    Expiration Date:   06/21/2024   Ferritin    Standing Status:   Future    Expected Date:   12/13/2023    Expiration Date:   06/21/2024      Bradley Sheppard,acting as a scribe for Doreatha Massed, MD.,have documented all relevant documentation on the behalf of Doreatha Massed, MD,as directed by  Doreatha Massed, MD while in the presence of Doreatha Massed, MD.  I, Doreatha Massed MD, have reviewed the above documentation for accuracy and completeness, and I agree with the  above.     Doreatha Massed, MD   2/18/20252:30 PM  CHIEF COMPLAINT:   Diagnosis: MALT lymphoma, normocytic anemia, and MGUS    Cancer Staging  MALT lymphoma Staging form: Hodgkin and Non-Hodgkin Lymphoma, AJCC 8th Edition - Clinical stage from 09/16/2021: Stage IV (Unknown) - Unsigned    Prior Therapy: none  Current Therapy:  surveillance   HISTORY OF PRESENT ILLNESS:   Oncology History   No history exists.     INTERVAL HISTORY:   Bradley Sheppard is a 83 y.o. male presenting to clinic today for follow up of MALT lymphoma, normocytic anemia, and MGUS. He was last seen by me on 12/22/22.  Today, he states that he is doing well overall. His appetite level is at 90%. His energy level is at 40%. He is accompanied by his wife and son.  He denies any fevers, night sweats, or unintentional weight loss. His wife notes he has bilateral leg weakness and is unable to hold his weight or ambulate. He is currently wheelchair bound. He previously had physical therapy, but has finished it and did not continue exercises after.  [name] has bilateral lower extremity edema. He is taking torsemide daily and metolazone 2 times a week with potassium.   Bradley Sheppard reports he has multiple red spots on his face. He denies any changes in color or size, or itching, peeling, or scabbing of the spots. He states his spots will be frozen off at another office.   PAST MEDICAL HISTORY:   Past Medical History: Past Medical History:  Diagnosis Date   (HFpEF) heart failure with preserved ejection fraction (HCC)    Anxiety    Aortic stenosis    Arthritis    Asthma    Chronic pain    CKD (chronic kidney disease) stage 3, GFR 30-59 ml/min (HCC)    Coronary atherosclerosis of native coronary artery    Multvessel, DES to LAD and RCA 8/03; EF 65% by echo 11/2014   DM2 (diabetes mellitus, type 2) (HCC)    Essential hypertension    Gastric ulcer    Gastritis    Related to NSAIDs   GERD (gastroesophageal  reflux disease)    Iron deficiency anemia    Mitral valve regurgitation    Mixed hyperlipidemia    Myocardial infarction [facility])    Nephrolithiasis     Surgical History: Past Surgical History:  Procedure Laterality Date   APPENDECTOMY     BIOPSY  09/25/2021   Procedure: BIOPSY;  Surgeon: Corbin Ade, MD;  Location: [facility];  Service: Endoscopy;;   CARDIAC CATHETERIZATION  01/2002   90% obstruction in proximal LAD, 60 % obstruction in proximal circumflex and 70%  in proximal RCA. LAD & RCA stented  stents x 2   COLONOSCOPY WITH  PROPOFOL N/A 09/25/2021   Procedure: COLONOSCOPY WITH PROPOFOL;  Surgeon: Corbin Ade, MD;  Location: [facility];  Service: Endoscopy;  Laterality: N/A;  11:00am   L knee replacement  2009   Subsequent infectino requiring resectino arthroplasty with incision and drainage 8/10, and reimplantizathion arthroplasty, 9/20. 5 total surgeries.   PARTIAL PROCTECTOMY BY TEM N/A 12/31/2021   Procedure: TEM PARTIAL PROTECTOMY;  Surgeon: [name], MD;  Location: [facility];  Service: General;  Laterality: N/A;    Social History: Social History   Socioeconomic History   Marital status: Married    Spouse name: Not on file   Number of children: Not on file   Years of education: Not on file   Highest education level: Not on file  Occupational History   Not on file  Tobacco Use   Smoking status: Former    Types: Cigarettes    Passive exposure: Never   Smokeless tobacco: Former    Types: Engineer, drilling   Vaping status: Never Used  Substance and Sexual Activity   Alcohol use: No   Drug use: No   Sexual activity: Not Currently  Other Topics Concern   Not on file  Social History Narrative   Full time- Designer, fashion/clothing         Social Drivers of Health   Financial Resource Strain: Low Risk  (03/27/2019)   Received from [facility], [facility]   Overall Financial Resource Strain (CARDIA)    Difficulty of Paying Living Expenses: Not hard at all   Food Insecurity: No Food Insecurity (09/05/2022)   Hunger Vital Sign    Worried About Running Out of Food in the Last Year: Never true    Ran Out of Food in the Last Year: Never true  Transportation Needs: No Transportation Needs (09/05/2022)   PRAPARE - Administrator, Civil Service (Medical): No    Lack of Transportation (Non-Medical): No  Physical Activity: Inactive (03/27/2019)   Received from [facility], [facility]   Exercise Vital Sign    Days of Exercise per Week: 0 days    Minutes of Exercise per Session: 0 min  Stress: No Stress Concern Present (03/27/2019)   Received from [facility], [facility] - Occupational Stress Questionnaire    Feeling of Stress : Not at all  Social Connections: Socially Isolated (03/27/2019)   Received from [facility], [facility]   Social Connection and Isolation Panel [NHANES]    Frequency of Communication with Friends and Family: Twice a week    Frequency of Social Gatherings with Friends and Family: Never    Attends Religious Services: Never    Database administrator or Organizations: No    Attends Banker Meetings: Never    Marital Status: Married  Catering manager Violence: Not At Risk (09/05/2022)   Humiliation, Afraid, Rape, and Kick questionnaire    Fear of Current or Ex-Partner: No    Emotionally Abused: No    Physically Abused: No    Sexually Abused: No    Family History: Family History  Problem Relation Age of Onset   Diabetes Mother    Aneurysm Mother        Brain   Stroke Father    Stroke Other    Colon cancer Neg Hx     Current Medications:  Current Outpatient Medications:    acetaminophen (TYLENOL) 500 MG  tablet, Take 1,000 mg by mouth every 6 (six) hours as needed for moderate pain., Disp: , Rfl:    aspirin EC 81 MG tablet, Take 1 tablet (81 mg total) by mouth daily with breakfast. Swallow whole., Disp: 30 tablet, Rfl: 12    Blood Pressure Monitoring (BLOOD PRESSURE CUFF) MISC, 1 each by Does not apply route as directed. Dx: hypertension & heart failure, Disp: 1 each, Rfl: 0   Calcium Polycarbophil (FIBER-LAX PO), Take 1 capsule by mouth daily., Disp: , Rfl:    feeding supplement (ENSURE SURGERY) LIQD, Take 237 mLs by mouth 2 (two) times daily between meals., Disp: , Rfl:    glipiZIDE (GLUCOTROL XL) 10 MG 24 hr tablet, Take 1 tablet (10 mg total) by mouth daily with breakfast. (Patient taking differently: Take 10 mg by mouth 2 (two) times daily.), Disp: , Rfl:    insulin glargine (LANTUS) 100 UNIT/ML injection, Inject 0.1 mLs (10 Units total) into the skin daily., Disp: , Rfl:    LORazepam (ATIVAN) 0.5 MG tablet, Take 1 tablet (0.5 mg total) by mouth at bedtime as needed for anxiety or sleep., Disp: 3 tablet, Rfl: 0   metolazone (ZAROXOLYN) 5 MG tablet, Take 2.5 mg by mouth 2 (two) times a week., Disp: , Rfl:    metoprolol tartrate (LOPRESSOR) 25 MG tablet, Take 25 Mg daily as needed if blood pressure is higher than 120/80 With extra tablet daily as needed, Disp: 180 tablet, Rfl: 3   midodrine (PROAMATINE) 5 MG tablet, Take 1 tablet (5 mg total) by mouth 3 (three) times daily with meals., Disp: 180 tablet, Rfl: 1   nitroGLYCERIN (NITROSTAT) 0.4 MG SL tablet, DISSOLVE 1 TABLET UNDER THE TONGUE EVERY 5 MINUTES AS NEEDED FOR CHEST PAIN. DO NOT EXCEED A TOTAL OF 3 DOSES IN 15 MINUTES., Disp: 25 tablet, Rfl: 3   oxyCODONE (OXY IR/ROXICODONE) 5 MG immediate release tablet, Take 0.5-1 tablets (2.5-5 mg total) by mouth every 6 (six) hours as needed for severe pain., Disp: 5 tablet, Rfl: 0   polyethylene glycol (MIRALAX / GLYCOLAX) 17 g packet, Take 17 g by mouth 2 (two) times daily as needed for mild constipation., Disp: 14 each, Rfl: 0   potassium chloride (KLOR-CON) 10 MEQ tablet, Take 20 mEq by mouth daily as needed. Take with [employer], Disp: , Rfl:    rOPINIRole (REQUIP) 0.25 MG tablet, Take 0.25 mg by mouth at bedtime.,  Disp: , Rfl:    rosuvastatin (CRESTOR) 5 MG tablet, Take 5 mg by mouth every Wednesday., Disp: , Rfl:    tamsulosin (FLOMAX) 0.4 MG CAPS capsule, Take 1 capsule (0.4 mg total) by mouth daily., Disp: 90 capsule, Rfl: 1   torsemide (DEMADEX) 20 MG tablet, Take 1 tablet (20 mg total) by mouth daily., Disp: 90 tablet, Rfl: 3   vitamin B-12 (CYANOCOBALAMIN) 500 MCG tablet, Take 500 mcg by mouth daily., Disp: , Rfl:    vitamin C (ASCORBIC ACID) 500 MG tablet, Take 500 mg by mouth at bedtime., Disp: , Rfl:    Zinc Oxide 40 % PSTE, Apply 1 Application topically as needed. Apply to buttock and sacrum if silicone foam is not effective due to incontinence, Disp: 453 g, Rfl: 1   Allergies: Allergies  Allergen Reactions   Nsaids Other (See Comments)    On blood thinners with h/o IDA/bleeding   Prednisone Other (See Comments)    "makes him feel faint", hyperglycemic     REVIEW OF SYSTEMS:   Review of Systems  Constitutional:  Negative for chills, fatigue and fever.  HENT:   Negative for lump/mass, mouth sores, nosebleeds, sore throat and trouble swallowing.   Eyes:  Negative for eye problems.  Respiratory:  Positive for shortness of breath. Negative for cough.   Cardiovascular:  Positive for chest pain. Negative for leg swelling and palpitations.  Gastrointestinal:  Positive for constipation. Negative for abdominal pain, diarrhea, nausea and vomiting.  Genitourinary:  Negative for bladder incontinence, difficulty urinating, dysuria, frequency, hematuria and nocturia.   Musculoskeletal:  Negative for arthralgias, back pain, flank pain, myalgias and neck pain.       +left leg pain, 6/10 severity  Skin:  Negative for itching and rash.  Neurological:  Positive for dizziness (occasional) and numbness (in fingers). Negative for headaches.  Hematological:  Does not bruise/bleed easily.  Psychiatric/Behavioral:  Negative for depression, sleep disturbance and suicidal ideas. The patient is not  nervous/anxious.   All other systems reviewed and are negative.    VITALS:   Blood pressure (!) 84/54, pulse 83, temperature (!) 97.5 F (36.4 C), temperature source Oral, resp. rate 18, SpO2 100%.  Wt Readings from Last 3 Encounters:  05/28/23 215 lb (97.5 kg)  10/06/22 198 lb (89.8 kg)  09/10/22 202 lb 6.1 oz (91.8 kg)    There is no height or weight on file to calculate BMI.  Performance status (ECOG): 1 - Symptomatic but completely ambulatory  PHYSICAL EXAM:   Physical Exam Vitals and nursing note reviewed. Exam conducted with a chaperone present.  Constitutional:      Appearance: Normal appearance.  Cardiovascular:     Rate and Rhythm: Normal rate and regular rhythm.     Pulses: Normal pulses.     Heart sounds: Normal heart sounds.  Pulmonary:     Effort: Pulmonary effort is normal.     Breath sounds: Normal breath sounds.  Abdominal:     Palpations: Abdomen is soft. There is no hepatomegaly, splenomegaly or mass.     Tenderness: There is no abdominal tenderness.  Musculoskeletal:     Right lower leg: 2+ Edema present.     Left lower leg: 2+ Edema present.  Lymphadenopathy:     Cervical: No cervical adenopathy.     Right cervical: No superficial, deep or posterior cervical adenopathy.    Left cervical: No superficial, deep or posterior cervical adenopathy.     Upper Body:     Right upper body: No supraclavicular or axillary adenopathy.     Left upper body: No supraclavicular or axillary adenopathy.  Neurological:     General: No focal deficit present.     Mental Status: He is alert and oriented to person, place, and time.  Psychiatric:        Mood and Affect: Mood normal.        Behavior: Behavior normal.     LABS:      Latest Ref Rng & Units 06/17/2023    1:11 PM 12/16/2022    8:58 AM 09/09/2022    4:41 AM  CBC  WBC 4.0 - 10.5 K/uL 8.1  8.0  8.6   Hemoglobin 13.0 - 17.0 g/dL 16.1  09.6  04.5   Hematocrit 39.0 - 52.0 % 39.7  38.9  33.3   Platelets 150 -  400 K/uL 229  253  177       Latest Ref Rng & Units 06/17/2023    1:11 PM 12/16/2022    8:58 AM 10/30/2022    2:06 PM  CMP  Glucose 70 -  99 mg/dL 161  096  045   BUN 8 - 23 mg/dL 79  60  67   Creatinine 0.61 - 1.24 mg/dL 4.09  8.11  9.14   Sodium 135 - 145 mmol/L 137  137  138   Potassium 3.5 - 5.1 mmol/L 4.3  4.3  5.0   Chloride 98 - 111 mmol/L 101  103  103   CO2 22 - 32 mmol/L 27  29  25    Calcium 8.9 - 10.3 mg/dL 9.1  8.8  8.7   Total Protein 6.5 - 8.1 g/dL 8.7  8.6    Total Bilirubin 0.0 - 1.2 mg/dL 0.7  0.6    Alkaline Phos 38 - 126 U/L 100  101    AST 15 - 41 U/L 22  27    ALT 0 - 44 U/L 19  26       No results found for: "CEA1", "CEA" / No results found for: "CEA1", "CEA" No results found for: "PSA1" No results found for: "NWG956" No results found for: "CAN125"  Lab Results  Component Value Date   TOTALPROTELP 8.7 (H) 06/17/2023   ALBUMINELP 3.5 06/17/2023   A1GS 0.2 06/17/2023   A2GS 0.8 06/17/2023   BETS 0.8 06/17/2023   GAMS 3.5 (H) 06/17/2023   MSPIKE 3.2 (H) 06/17/2023   SPEI Comment 06/17/2023   Lab Results  Component Value Date   TIBC 242 (L) 12/16/2022   TIBC 261 05/27/2022   TIBC 199 (L) 01/28/2022   FERRITIN 201 12/16/2022   FERRITIN 131 05/27/2022   FERRITIN 373 (H) 01/28/2022   IRONPCTSAT 26 12/16/2022   IRONPCTSAT 13 (L) 05/27/2022   IRONPCTSAT 13 (L) 01/28/2022   Lab Results  Component Value Date   LDH 177 06/17/2023   LDH 154 12/16/2022   LDH 153 05/27/2022     STUDIES:   No results found.

## 2023-06-22 NOTE — Patient Instructions (Addendum)

## 2023-06-23 ENCOUNTER — Inpatient Hospital Stay: Payer: HMO | Admitting: Hematology

## 2023-06-24 ENCOUNTER — Other Ambulatory Visit: Payer: Self-pay | Admitting: Nurse Practitioner

## 2023-06-24 DIAGNOSIS — N1832 Chronic kidney disease, stage 3b: Secondary | ICD-10-CM

## 2023-06-24 DIAGNOSIS — E1165 Type 2 diabetes mellitus with hyperglycemia: Secondary | ICD-10-CM | POA: Diagnosis not present

## 2023-06-24 DIAGNOSIS — E782 Mixed hyperlipidemia: Secondary | ICD-10-CM

## 2023-06-24 DIAGNOSIS — I480 Paroxysmal atrial fibrillation: Secondary | ICD-10-CM

## 2023-06-24 MED ORDER — ROSUVASTATIN CALCIUM 10 MG PO TABS: 10.0000 mg | ORAL_TABLET | Freq: Every day | ORAL | 1 refills | Status: DC

## 2023-06-24 MED ORDER — ROSUVASTATIN CALCIUM 10 MG PO TABS: ORAL_TABLET | ORAL | 1 refills | Status: AC

## 2023-06-28 DIAGNOSIS — E7800 Pure hypercholesterolemia, unspecified: Secondary | ICD-10-CM | POA: Diagnosis not present

## 2023-06-28 DIAGNOSIS — Z125 Encounter for screening for malignant neoplasm of prostate: Secondary | ICD-10-CM | POA: Diagnosis not present

## 2023-06-28 DIAGNOSIS — I10 Essential (primary) hypertension: Secondary | ICD-10-CM | POA: Diagnosis not present

## 2023-06-28 DIAGNOSIS — Z0000 Encounter for general adult medical examination without abnormal findings: Secondary | ICD-10-CM | POA: Diagnosis not present

## 2023-06-28 DIAGNOSIS — Z7189 Other specified counseling: Secondary | ICD-10-CM | POA: Diagnosis not present

## 2023-06-28 DIAGNOSIS — R5383 Other fatigue: Secondary | ICD-10-CM | POA: Diagnosis not present

## 2023-06-28 DIAGNOSIS — Z1331 Encounter for screening for depression: Secondary | ICD-10-CM | POA: Diagnosis not present

## 2023-06-28 DIAGNOSIS — Z79899 Other long term (current) drug therapy: Secondary | ICD-10-CM | POA: Diagnosis not present

## 2023-06-28 DIAGNOSIS — Z1339 Encounter for screening examination for other mental health and behavioral disorders: Secondary | ICD-10-CM | POA: Diagnosis not present

## 2023-06-28 DIAGNOSIS — Z299 Encounter for prophylactic measures, unspecified: Secondary | ICD-10-CM | POA: Diagnosis not present

## 2023-07-16 ENCOUNTER — Ambulatory Visit: Payer: HMO | Admitting: Nurse Practitioner

## 2023-07-22 DIAGNOSIS — E1165 Type 2 diabetes mellitus with hyperglycemia: Secondary | ICD-10-CM | POA: Diagnosis not present

## 2023-07-31 IMAGING — CT NM PET TUM IMG INITIAL (PI) WHOLE BODY
7 series · 25 of 25 positions shown · non-contrast
Comparison: Radiographic bone survey dated 08/01/2021

CLINICAL DATA: Initial treatment strategy for multiple myeloma.

EXAM:
NUCLEAR MEDICINE PET WHOLE BODY
TECHNIQUE: 9.7 mCi F-18 FDG was injected intravenously. Full-ring PET imaging
was performed from the head to foot after the radiotracer. CT data
was obtained and used for attenuation correction and anatomic
localization.
Fasting blood glucose: 109 mg/dl

[Series 3: ctac · axial · 3.0mm · 0.98mm/px · z∈[+40,+1898]mm · 4 of 620 slices shown]
[im 1/620  soft-tissue]
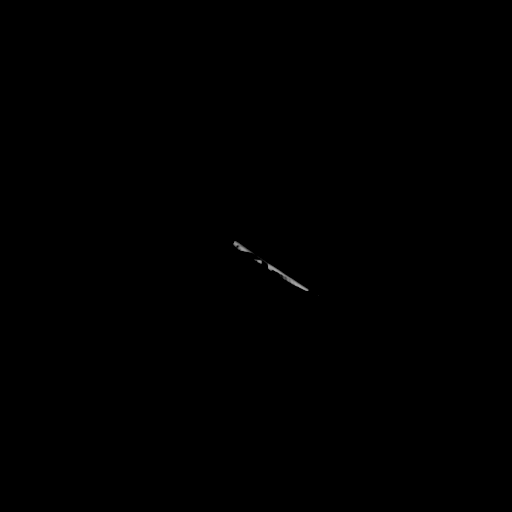
[im 207/620  soft-tissue]
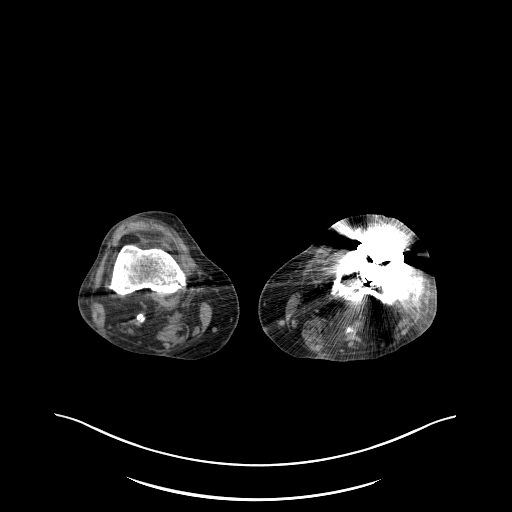
[im 413/620  soft-tissue]
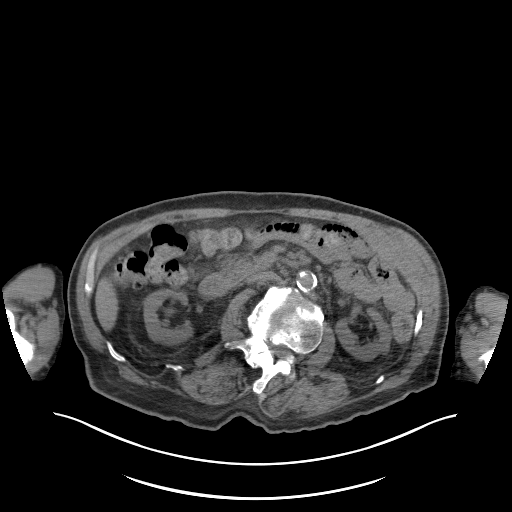
[im 620/620  soft-tissue]
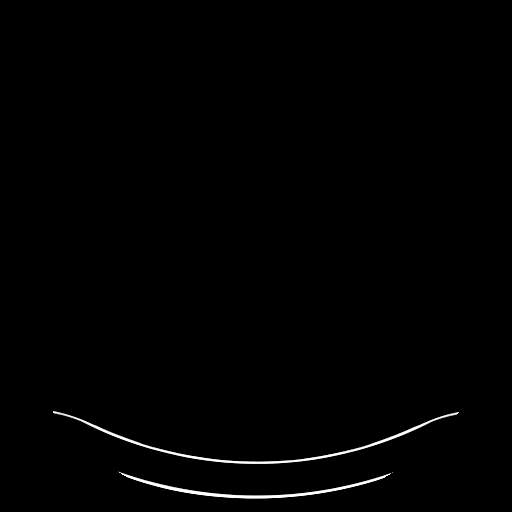

[Series 4: pet ac · axial · 3.0mm · 4.11mm/px · z∈[+40,+1898]mm · 5 of 620 slices shown]
[im 1/620]
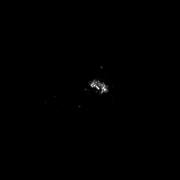
[im 155/620]
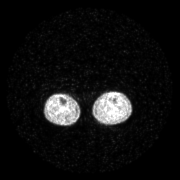
[im 310/620]
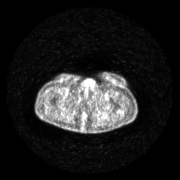
[im 465/620]
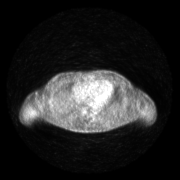
[im 620/620]
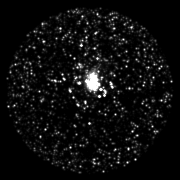

[Series 5: pet nac · axial · 3.0mm · 4.11mm/px · z∈[+40,+1898]mm · 5 of 620 slices shown]
[im 1/620]
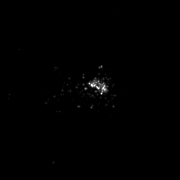
[im 155/620]
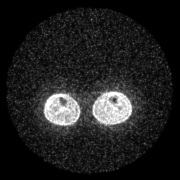
[im 310/620]
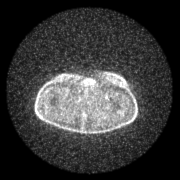
[im 465/620]
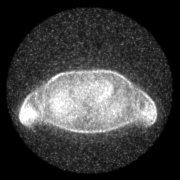
[im 620/620]
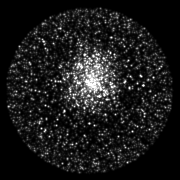

[Series 9: ctlung · axial · 3.0mm · 0.98mm/px · 1 of 110 slices shown]
[im 1/110  soft-tissue]
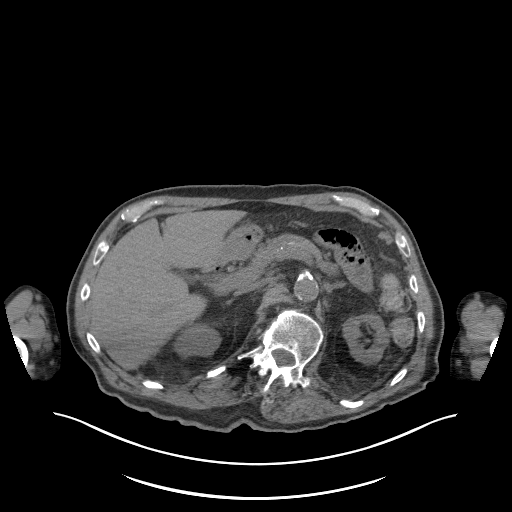

[Series 606: fused tra · 8 of 928 slices shown]
[im 1/928]
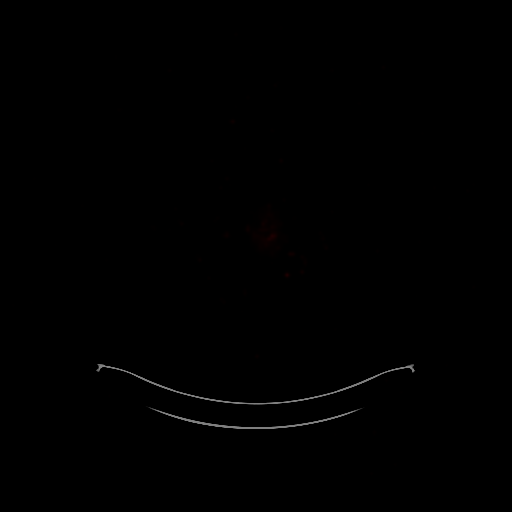
[im 133/928]
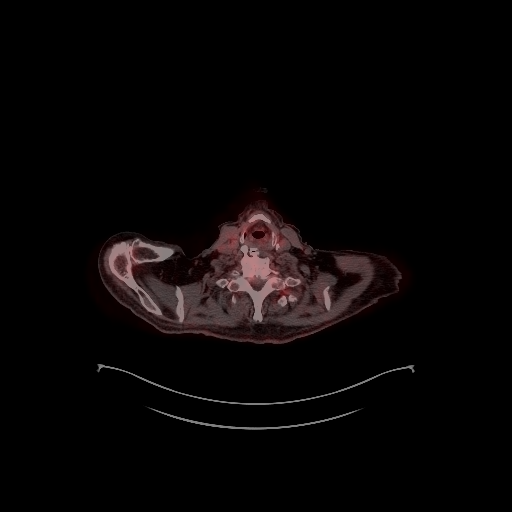
[im 265/928]
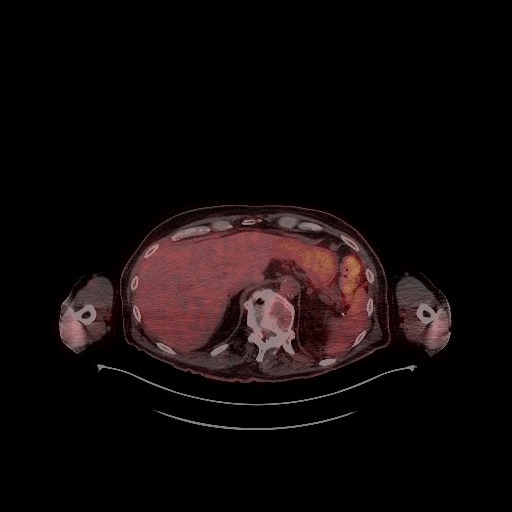
[im 398/928]
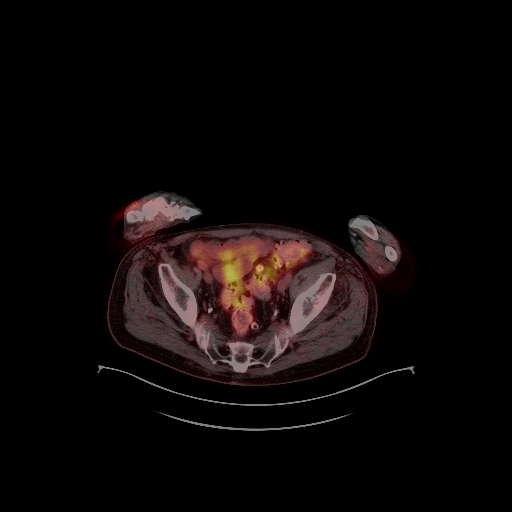
[im 530/928]
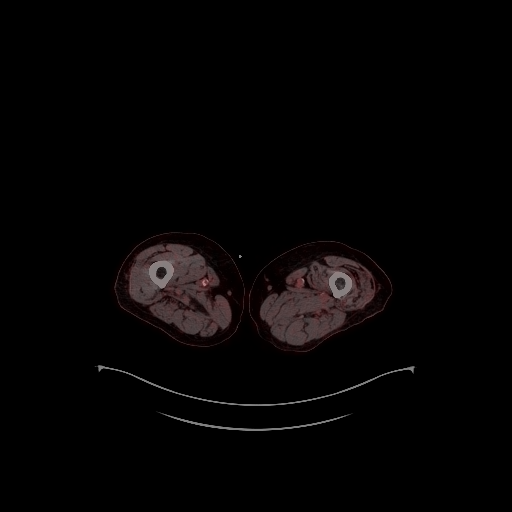
[im 663/928]
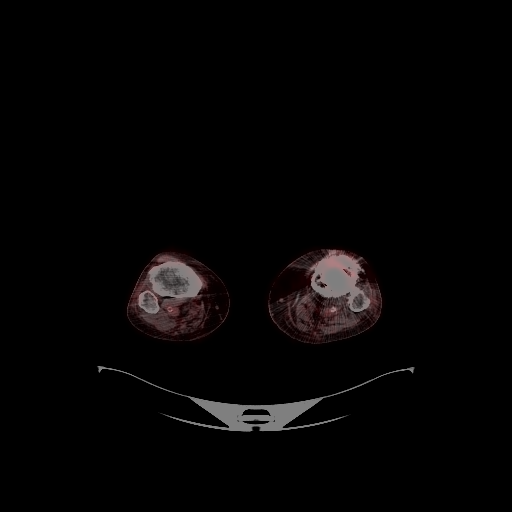
[im 795/928]
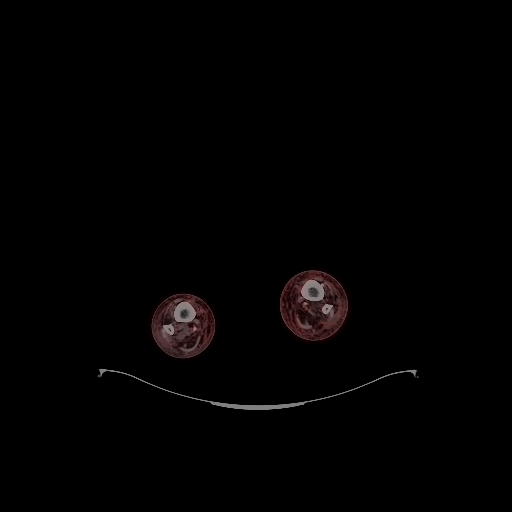
[im 928/928]
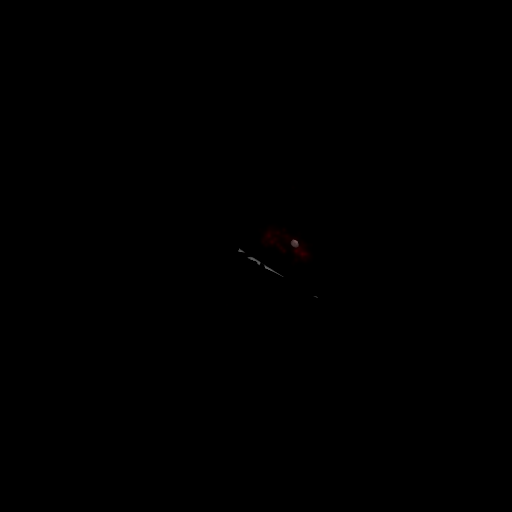

[Series 608: fused cor · 1 of 116 slices shown]
[im 1/116]
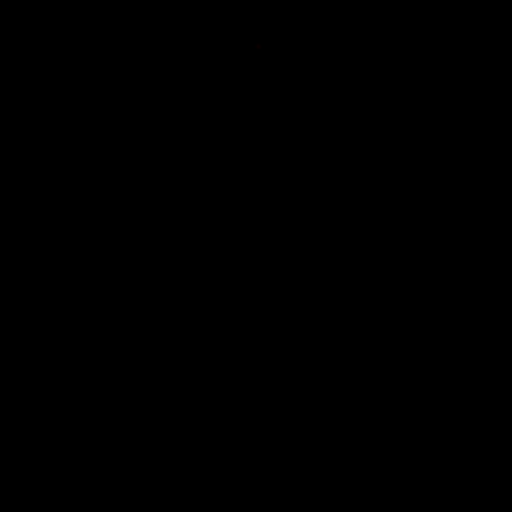

[Series 609: mip cine · coronal · 3.84mm/px · 1 of 48 slices shown]
[im 1/48]
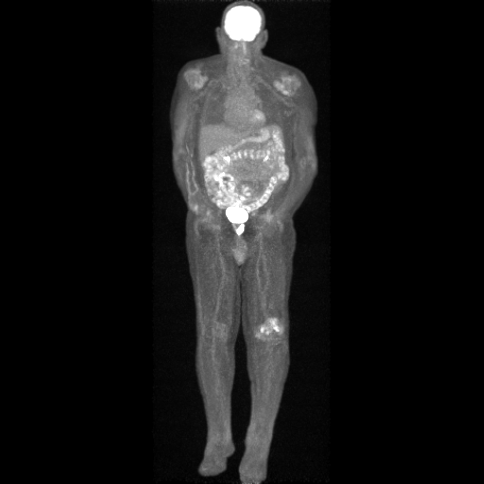

[25 of 25 positions shown; findings below may reference images not displayed]

FINDINGS: Mediastinal blood pool activity: SUV max

HEAD/NECK: No significant abnormal hypermetabolic activity in this
region. The small lucencies noted on radiography appear to involve
the inner table of the calvarial vertex and seem more consistent
with benign arachnoid granulations rather than myelomatous lesions.

Incidental CT findings: Questionable hypodensity inferiorly in the
left frontal lobe for example on image 37 series 3, probably beam
hardening artifact, underlying lesion difficult completely exclude.
Correlate with patient history in determining whether dedicated
imaging of the brain is recommended. Bilateral common carotid
atherosclerotic calcification.

CHEST: No significant abnormal hypermetabolic activity in this
region.

Incidental CT findings: Coronary, aortic arch, and branch vessel
atherosclerotic vascular disease. Mild cardiomegaly. Aortic valve
calcification. Nonspecific subpleural reticulation in both lungs,
fibrosis not excluded.

ABDOMEN/PELVIS: Abnormal accentuated metabolic activity in the left
side of the lower rectum extending to the anorectal junction,
appearance concerning for possible tumor although peristaltic
activity is not totally excluded. Maximum SUV 34.5.

Scattered physiologic activity in large and small bowel.

Incidental CT findings: Substantial atherosclerosis of the abdominal
aorta and its branches including the proximal celiac trunk. Sigmoid
colon diverticulosis.

SKELETON: Accentuated activity associated with these severe
arthropathy of both glenohumeral joints. Accentuated activity along
some of the margins of the femoral component of the left total knee
prosthesis, likely benign. No hypermetabolic lytic lesions
characteristic of active myeloma identified. No hypermetabolic
activity in the left proximal radial diaphysis discourse spine to
the tiny lucent lesions suggested on radiography. No definite
hypermetabolic activity along the calvarial vertex.

Incidental CT findings: Substantial levoconvex thoracolumbar
scoliosis with rotary component. Considerable right greater than
left degenerative hip arthropathy. Severe glenohumeral arthropathy
is noted above.

EXTREMITIES: No significant abnormal hypermetabolic activity in this
region.

Incidental CT findings: Lipoma along the margin of the right biceps,
image 156 series 3.
IMPRESSION: 1. Prominent accentuated metabolic activity in the left lower rectum
and anorectal region. Malignancy is not excluded. Assessment for
lower rectal malignancy by digital rectal exam and/or endoscopy
recommended.
2. No findings of active hypermetabolic lytic lesion in the skeleton
to indicate active myeloma.
3. Hypodensity inferiorly in the left frontal lobe is probably from
beam hardening artifact on the CT data. An underlying lesion is
considered less likely, but if patient has unexplained neurologic
symptoms or if otherwise clinically warranted, dedicated brain
imaging by dedicated CT or MRI might be considered.
4. Extensive atherosclerosis including the abdominal aorta, coronary
arteries, and aortic branches including the celiac trunk. Aortic
Atherosclerosis ([id number]).
5. Nonspecific subpleural reticulation in both lungs, potentially
from fibrosis.
6. Severe bilateral glenohumeral arthropathy. Left total knee
prosthesis.
7. Levoconvex thoracolumbar scoliosis with rotary component.
8. Right greater than left degenerative hip arthropathy.

## 2023-08-04 ENCOUNTER — Other Ambulatory Visit (HOSPITAL_COMMUNITY): Payer: Self-pay | Admitting: Nephrology

## 2023-08-04 DIAGNOSIS — E1122 Type 2 diabetes mellitus with diabetic chronic kidney disease: Secondary | ICD-10-CM

## 2023-08-04 DIAGNOSIS — N1832 Chronic kidney disease, stage 3b: Secondary | ICD-10-CM

## 2023-08-04 DIAGNOSIS — I350 Nonrheumatic aortic (valve) stenosis: Secondary | ICD-10-CM | POA: Diagnosis not present

## 2023-08-04 DIAGNOSIS — D472 Monoclonal gammopathy: Secondary | ICD-10-CM | POA: Diagnosis not present

## 2023-08-04 DIAGNOSIS — R809 Proteinuria, unspecified: Secondary | ICD-10-CM

## 2023-08-11 ENCOUNTER — Ambulatory Visit (HOSPITAL_COMMUNITY)

## 2023-08-16 ENCOUNTER — Ambulatory Visit (HOSPITAL_COMMUNITY)
Admission: RE | Admit: 2023-08-16 | Discharge: 2023-08-16 | Disposition: A | Discharge status: Home / Self Care | Source: Ambulatory Visit | Attending: Nephrology | Admitting: Nephrology

## 2023-08-16 DIAGNOSIS — R809 Proteinuria, unspecified: Secondary | ICD-10-CM | POA: Diagnosis not present

## 2023-08-16 DIAGNOSIS — N183 Chronic kidney disease, stage 3 unspecified: Secondary | ICD-10-CM | POA: Diagnosis not present

## 2023-08-16 DIAGNOSIS — N1832 Chronic kidney disease, stage 3b: Secondary | ICD-10-CM | POA: Diagnosis not present

## 2023-08-16 DIAGNOSIS — E1122 Type 2 diabetes mellitus with diabetic chronic kidney disease: Secondary | ICD-10-CM | POA: Insufficient documentation

## 2023-08-16 DIAGNOSIS — N281 Cyst of kidney, acquired: Secondary | ICD-10-CM | POA: Diagnosis not present

## 2023-09-01 ENCOUNTER — Other Ambulatory Visit: Payer: Self-pay | Admitting: Nurse Practitioner

## 2023-09-02 DIAGNOSIS — I5032 Chronic diastolic (congestive) heart failure: Secondary | ICD-10-CM | POA: Diagnosis not present

## 2023-09-02 DIAGNOSIS — D472 Monoclonal gammopathy: Secondary | ICD-10-CM | POA: Diagnosis not present

## 2023-09-02 DIAGNOSIS — E1122 Type 2 diabetes mellitus with diabetic chronic kidney disease: Secondary | ICD-10-CM | POA: Diagnosis not present

## 2023-09-02 DIAGNOSIS — N1832 Chronic kidney disease, stage 3b: Secondary | ICD-10-CM | POA: Diagnosis not present

## 2023-09-07 ENCOUNTER — Ambulatory Visit: Attending: Nurse Practitioner | Admitting: Nurse Practitioner

## 2023-09-07 ENCOUNTER — Encounter: Payer: Self-pay | Admitting: Nurse Practitioner

## 2023-09-07 VITALS — BP 124/68 | HR 98 | Ht 72.0 in | Wt 215.0 lb

## 2023-09-07 DIAGNOSIS — N1832 Chronic kidney disease, stage 3b: Secondary | ICD-10-CM | POA: Diagnosis not present

## 2023-09-07 DIAGNOSIS — R600 Localized edema: Secondary | ICD-10-CM | POA: Diagnosis not present

## 2023-09-07 DIAGNOSIS — I350 Nonrheumatic aortic (valve) stenosis: Secondary | ICD-10-CM | POA: Diagnosis not present

## 2023-09-07 DIAGNOSIS — C884 Extranodal marginal zone b-cell lymphoma of mucosa-associated lymphoid tissue (malt-lymphoma) not having achieved remission: Secondary | ICD-10-CM

## 2023-09-07 DIAGNOSIS — I4892 Unspecified atrial flutter: Secondary | ICD-10-CM | POA: Diagnosis not present

## 2023-09-07 DIAGNOSIS — I5032 Chronic diastolic (congestive) heart failure: Secondary | ICD-10-CM | POA: Diagnosis not present

## 2023-09-07 DIAGNOSIS — I3139 Other pericardial effusion (noninflammatory): Secondary | ICD-10-CM

## 2023-09-07 DIAGNOSIS — I2510 Atherosclerotic heart disease of native coronary artery without angina pectoris: Secondary | ICD-10-CM | POA: Diagnosis not present

## 2023-09-07 DIAGNOSIS — D472 Monoclonal gammopathy: Secondary | ICD-10-CM | POA: Diagnosis not present

## 2023-09-07 DIAGNOSIS — D649 Anemia, unspecified: Secondary | ICD-10-CM

## 2023-09-07 DIAGNOSIS — I480 Paroxysmal atrial fibrillation: Secondary | ICD-10-CM | POA: Diagnosis not present

## 2023-09-07 DIAGNOSIS — I872 Venous insufficiency (chronic) (peripheral): Secondary | ICD-10-CM | POA: Diagnosis not present

## 2023-09-07 DIAGNOSIS — R7989 Other specified abnormal findings of blood chemistry: Secondary | ICD-10-CM | POA: Diagnosis not present

## 2023-09-07 DIAGNOSIS — I5031 Acute diastolic (congestive) heart failure: Secondary | ICD-10-CM

## 2023-09-07 NOTE — Patient Instructions (Addendum)
 Medication Instructions:  Your physician recommends that you continue on your current medications as directed. Please refer to the Current Medication list given to you today.  Labwork: In 1-2 weeks   Testing/Procedures: None   Follow-Up: Your physician recommends that you schedule a follow-up appointment in: 3-4 months   Any Other Special Instructions Will Be Listed Below (If Applicable).   If you need a refill on your cardiac medications before your next appointment, please call your pharmacy.

## 2023-09-07 NOTE — Progress Notes (Signed)
 Cardiology Office Note:  .   Date: 09/07/2023 ID:  Bradley Sheppard, DOB 06/02/1940, MRN [id number] PCP: Orlena Bitters, MD  [facility] Providers Cardiologist:  Teddie Favre, MD    History of Present Illness: .   Bradley Sheppard is a 83 y.o. male with a PMH of CAD, s/p DES to LAD and RCA in 2003, PAF, HFpEF, moderate aortic valve stenosis, MALT lymphoma, anemia, MGUS, and CKD stage 3b, who presents today for scheduled follow-up.   Last seen by Dr. Londa Rival on March 04, 2023.  Leg edema had improved some, was noted that he had distal venous stasis and foot swelling/lymphedema.  He was relatively hypotensive although BP trends were better since starting low-dose midodrine .  Heart rates ranged from 90s to low 100s.  Was noted to be chronically short of breath that was stable, chronically fatigued. Palliative care was discussed.  05/28/2023 - Today he presents for scheduled follow-up.  Wife shows me his heart rate/BP readings that show a heart rate ranging from 67 -134, BP soft with BP dropping to 3 low readings of 85, 87, and 98.  Wife says that she spoke about palliative care with PCP, and has not heard anything back from PCPs office.  Patient denies any chest pain, palpitations, syncope, presyncope, dizziness, orthopnea, PND,  significant weight changes, acute bleeding, or claudication.  Leg edema appears to be stable.  Breathing and fatigue appear to be stable.   09/07/2023 - Presents today for follow-up with his wife and daughter.  Patient says he is doing well and daughter and wife state he is doing better since I last saw him.  Transferring better than he was previously.  Wife confirms that they have not heard back from palliative care at this point in will be interested in home based primary care. Not as fatigued when I last saw him. Denies any chest pain, shortness of breath, palpitations, syncope, presyncope, dizziness, orthopnea, PND, significant weight changes, acute bleeding, or  claudication. Leg swelling is improved from last office visit.  Shows me his blood pressure/heart rate/blood sugar reading log from home shows overall well-controlled readings.  BP is doing well per wife's report.  ROS: Negative.  See HPI. Studies Reviewed: Aaron Aas    EKG: EKG is not ordered today.    Echo 09/2022: 1. Left ventricular ejection fraction, by estimation, is 55 to 60%. The  left ventricle has normal function. The left ventricle has no regional  wall motion abnormalities. There is severe left ventricular hypertrophy.  Left ventricular diastolic parameters   are indeterminate.   2. RV not well visualized. Grossly appears normal in size and function. .  Right ventricular systolic function was not well visualized. The right  ventricular size is not well visualized. Tricuspid regurgitation signal is  inadequate for assessing PA  pressure.   3. A small pericardial effusion is present. The pericardial effusion is  circumferential.   4. The mitral valve is normal in structure. No evidence of mitral valve  regurgitation. No evidence of mitral stenosis.   5. LVOT and AV Dopplers are technically limited, likely underestimated  values based on angle of acquisition. Consider limited study to reevaluate  aortic valve, appears to be at least some degree of stenosis. . The aortic  valve is tricuspid. There is  moderate calcification of the aortic valve. There is moderate thickening  of the aortic valve. Aortic valve regurgitation is not visualized.   6. The inferior vena cava is dilated in size with <  50% respiratory  variability, suggesting right atrial pressure of 15 mmHg.  Risk Assessment/Calculations:    CHA2DS2-VASc Score = 4   This indicates a 4.8% annual risk of stroke. The patient's score is based upon: CHF History: 1 HTN History: 0 Diabetes History: 0 Stroke History: 0 Vascular Disease History: 1 Age Score: 2 Gender Score: 0  Physical Exam:   VS:  BP 124/68   Pulse 98    Ht 6' (1.829 m)   Wt 215 lb (97.5 kg)   SpO2 98%   BMI 29.16 kg/m    Wt Readings from Last 3 Encounters:  09/07/23 215 lb (97.5 kg)  05/28/23 215 lb (97.5 kg)  10/06/22 198 lb (89.8 kg)    GEN: Well nourished, well developed in no acute distress, sitting in wheelchair, generalized weakness NECK: No JVD; No carotid bruits CARDIAC: S1/S2, irregularly irregular rhythm, no murmurs, no rubs, no gallops.  RESPIRATORY:  Clear to auscultation without rales, wheezing or rhonchi  EXTREMITIES:  Improved nonpitting edema to BLE (L >R) from previous visit,  No deformity. Improved signs of venous insufficiency, bilaterally. 1+ pulses to bilateral PT. 2+ radial pulses bilaterally.  ASSESSMENT AND PLAN: .    HFpEF, leg edema, venous insufficiency Stage C, NYHA class II-III symptoms.  Leg edema is improved per  wife's report. Echo 09/2022 showed EF 55-60%. Metoprolol  has been held due to lower BP's. Log shows overall well controlled readings. No DVT's seen on doppler study. Continue current medication regimen. Low sodium diet, fluid restriction <2L, and daily weights encouraged. Educated to contact our office for weight gain of 2 lbs overnight or 5 lbs in one week. Recommended leg elevation and compression stockings to improve pedal edema and for signs/symptoms of PVD along bilateral feet. GDMT limited at this time. No other medication changes at this time. Care and ED precautions discussed.    PAF/A-flutter with RVR Denies any tachycardia or palpitations. HR overall well controlled. Metoprolol  has been held d/t BP trends. No medication changes at this time. Has declined AC d/t hx of rectal bleeding. Not a candidate for DCCV. Care and ED precautions discussed.   CAD, s/p DES to LAD and RCA in 2003 Stable with no anginal symptoms. No indication for ischemic evaluation. Continue aspirin , rosuvastatin , and NTG PRN. Parameters have been previously given for metoprolol  tartrate. Heart healthy diet encouraged. LDL  elevated 06/2023. Crestor  was previously increased.  Orders have been previously given to recheck FLP in 2 to 3 months.  If not addressed by next office visit, plan to address this.  Moderate aortic valve stenosis Hx of moderate AV stenosis in past year. Most recent Echo showed AV gradient of 14 mmHg. Will continue to follow conservatively.   5. Pericardial effusion TTE 09/2022 revealed small pericardial effusion, no evidence of cardiac tamponade. Pt denies any red flag symptoms. Will continue to monitor at this time.   6. CKD stage 3b, elevated serum creatinine Most recent labs obtained showed sCr at 2.11 with eGFR at 30. Has upcoming labs scheduled with Oncology. Avoid nephrotoxic agents. Continue to follow with PCP. Will recheck BMET.   7. MGUS, anemia, [name] Patient has known diagnoses of stage IV MALT lymphoma, MGUS, and normocytic anemia. Currently following Oncology. Fatigue and weakness have improved, sitting in a wheelchair for office visit.  Advance care planning discussed.  Discussed/reviewed goals of care and came to shared medical decision that we will place referral to palliative care. Will consult [facility] for more assistance.  Dispo: Follow-up with me or APP in 3-4 months or sooner if anything changes.   Signed, Lasalle Pointer, NP

## 2023-09-10 ENCOUNTER — Telehealth: Payer: Self-pay | Admitting: Nurse Practitioner

## 2023-09-10 NOTE — Telephone Encounter (Signed)
 Spoke with Bradley Sheppard at [facility] advised her that patient is supposed to be taking Torsemide  according to [place] note on 09/07/2023.

## 2023-09-10 NOTE — Telephone Encounter (Signed)
 Pharmacy called in to clarify if pt is supposed to be on Torsemide  or Furosemide . She stated another doctor requested Furosemide . Please advise.

## 2023-09-15 DIAGNOSIS — I5032 Chronic diastolic (congestive) heart failure: Secondary | ICD-10-CM | POA: Diagnosis not present

## 2023-09-15 DIAGNOSIS — I10 Essential (primary) hypertension: Secondary | ICD-10-CM | POA: Diagnosis not present

## 2023-09-15 DIAGNOSIS — I700 Atherosclerosis of aorta: Secondary | ICD-10-CM | POA: Diagnosis not present

## 2023-09-15 DIAGNOSIS — E1165 Type 2 diabetes mellitus with hyperglycemia: Secondary | ICD-10-CM | POA: Diagnosis not present

## 2023-09-15 DIAGNOSIS — Z299 Encounter for prophylactic measures, unspecified: Secondary | ICD-10-CM | POA: Diagnosis not present

## 2023-09-15 DIAGNOSIS — F1320 Sedative, hypnotic or anxiolytic dependence, uncomplicated: Secondary | ICD-10-CM | POA: Diagnosis not present

## 2023-09-15 DIAGNOSIS — N400 Enlarged prostate without lower urinary tract symptoms: Secondary | ICD-10-CM | POA: Diagnosis not present

## 2023-09-15 DIAGNOSIS — Z0000 Encounter for general adult medical examination without abnormal findings: Secondary | ICD-10-CM | POA: Diagnosis not present

## 2023-10-04 ENCOUNTER — Ambulatory Visit: Payer: HMO | Admitting: Urology

## 2023-10-05 ENCOUNTER — Other Ambulatory Visit (HOSPITAL_COMMUNITY)
Admission: RE | Admit: 2023-10-05 | Discharge: 2023-10-05 | Disposition: A | Discharge status: Home / Self Care | Source: Ambulatory Visit | Attending: Nurse Practitioner | Admitting: Nurse Practitioner

## 2023-10-05 DIAGNOSIS — N1832 Chronic kidney disease, stage 3b: Secondary | ICD-10-CM | POA: Insufficient documentation

## 2023-10-05 LAB — BASIC METABOLIC PANEL WITH GFR
Anion gap: 7 (ref 5–15)
BUN: 74 mg/dL — ABNORMAL HIGH (ref 8–23)
CO2: 25 mmol/L (ref 22–32)
Calcium: 8.4 mg/dL — ABNORMAL LOW (ref 8.9–10.3)
Chloride: 101 mmol/L (ref 98–111)
Creatinine, Ser: 2.38 mg/dL — ABNORMAL HIGH (ref 0.61–1.24)
GFR, Estimated: 26 mL/min — ABNORMAL LOW (ref >60)
Glucose, Bld: 188 mg/dL — ABNORMAL HIGH (ref 70–99)
Potassium: 4.2 mmol/L (ref 3.5–5.1)
Sodium: 133 mmol/L — ABNORMAL LOW (ref 135–145)

## 2023-10-06 ENCOUNTER — Ambulatory Visit: Payer: Self-pay | Admitting: Nurse Practitioner

## 2023-10-06 DIAGNOSIS — R7989 Other specified abnormal findings of blood chemistry: Secondary | ICD-10-CM

## 2023-10-06 DIAGNOSIS — Z79899 Other long term (current) drug therapy: Secondary | ICD-10-CM

## 2023-10-06 DIAGNOSIS — N1832 Chronic kidney disease, stage 3b: Secondary | ICD-10-CM

## 2023-11-02 DIAGNOSIS — M7989 Other specified soft tissue disorders: Secondary | ICD-10-CM | POA: Diagnosis not present

## 2023-11-02 DIAGNOSIS — M79673 Pain in unspecified foot: Secondary | ICD-10-CM | POA: Diagnosis not present

## 2023-11-02 DIAGNOSIS — E1165 Type 2 diabetes mellitus with hyperglycemia: Secondary | ICD-10-CM | POA: Diagnosis not present

## 2023-11-04 ENCOUNTER — Other Ambulatory Visit (HOSPITAL_COMMUNITY)
Admission: RE | Admit: 2023-11-04 | Discharge: 2023-11-04 | Disposition: A | Discharge status: Home / Self Care | Source: Ambulatory Visit | Attending: Nephrology | Admitting: Nephrology

## 2023-11-04 ENCOUNTER — Other Ambulatory Visit (HOSPITAL_COMMUNITY)
Admission: RE | Admit: 2023-11-04 | Discharge: 2023-11-04 | Disposition: A | Discharge status: Home / Self Care | Source: Ambulatory Visit | Attending: Nurse Practitioner | Admitting: Nurse Practitioner

## 2023-11-04 DIAGNOSIS — I10 Essential (primary) hypertension: Secondary | ICD-10-CM | POA: Insufficient documentation

## 2023-11-04 DIAGNOSIS — Z79899 Other long term (current) drug therapy: Secondary | ICD-10-CM | POA: Diagnosis present

## 2023-11-04 DIAGNOSIS — R7989 Other specified abnormal findings of blood chemistry: Secondary | ICD-10-CM | POA: Insufficient documentation

## 2023-11-04 DIAGNOSIS — N1832 Chronic kidney disease, stage 3b: Secondary | ICD-10-CM | POA: Diagnosis present

## 2023-11-04 LAB — URINALYSIS, W/ REFLEX TO CULTURE (INFECTION SUSPECTED)
Bilirubin Urine: NEGATIVE
Glucose, UA: NEGATIVE mg/dL
Hgb urine dipstick: NEGATIVE
Ketones, ur: NEGATIVE mg/dL
Leukocytes,Ua: NEGATIVE
Nitrite: NEGATIVE
Protein, ur: NEGATIVE mg/dL
Specific Gravity, Urine: 1.014 (ref 1.005–1.030)
pH: 5 (ref 5.0–8.0)

## 2023-11-04 LAB — CBC
HCT: 37.6 % — ABNORMAL LOW (ref 39.0–52.0)
Hemoglobin: 12.8 g/dL — ABNORMAL LOW (ref 13.0–17.0)
MCH: 31.5 pg (ref 26.0–34.0)
MCHC: 34 g/dL (ref 30.0–36.0)
MCV: 92.6 fL (ref 80.0–100.0)
Platelets: 249 10*3/uL (ref 150–400)
RBC: 4.06 MIL/uL — ABNORMAL LOW (ref 4.22–5.81)
RDW: 13.5 % (ref 11.5–15.5)
WBC: 9 10*3/uL (ref 4.0–10.5)
nRBC: 0 % (ref 0.0–0.2)

## 2023-11-04 LAB — HEPATITIS B SURFACE ANTIBODY,QUALITATIVE: Hep B S Ab: NONREACTIVE

## 2023-11-04 LAB — BASIC METABOLIC PANEL WITH GFR
Anion gap: 10 (ref 5–15)
BUN: 79 mg/dL — ABNORMAL HIGH (ref 8–23)
CO2: 26 mmol/L (ref 22–32)
Calcium: 8.5 mg/dL — ABNORMAL LOW (ref 8.9–10.3)
Chloride: 97 mmol/L — ABNORMAL LOW (ref 98–111)
Creatinine, Ser: 2.71 mg/dL — ABNORMAL HIGH (ref 0.61–1.24)
GFR, Estimated: 22 mL/min — ABNORMAL LOW (ref >60)
Glucose, Bld: 231 mg/dL — ABNORMAL HIGH (ref 70–99)
Potassium: 3.8 mmol/L (ref 3.5–5.1)
Sodium: 133 mmol/L — ABNORMAL LOW (ref 135–145)

## 2023-11-04 LAB — RENAL FUNCTION PANEL
Albumin: 2.9 g/dL — ABNORMAL LOW (ref 3.5–5.0)
Anion gap: 9 (ref 5–15)
BUN: 79 mg/dL — ABNORMAL HIGH (ref 8–23)
CO2: 26 mmol/L (ref 22–32)
Calcium: 8.5 mg/dL — ABNORMAL LOW (ref 8.9–10.3)
Chloride: 98 mmol/L (ref 98–111)
Creatinine, Ser: 2.65 mg/dL — ABNORMAL HIGH (ref 0.61–1.24)
GFR, Estimated: 23 mL/min — ABNORMAL LOW (ref >60)
Glucose, Bld: 234 mg/dL — ABNORMAL HIGH (ref 70–99)
Phosphorus: 3.9 mg/dL (ref 2.5–4.6)
Potassium: 3.7 mmol/L (ref 3.5–5.1)
Sodium: 133 mmol/L — ABNORMAL LOW (ref 135–145)

## 2023-11-04 LAB — VITAMIN D 25 HYDROXY (VIT D DEFICIENCY, FRACTURES): Vit D, 25-Hydroxy: 25.71 ng/mL — ABNORMAL LOW (ref 30–100)

## 2023-11-04 LAB — HEPATITIS C ANTIBODY: HCV Ab: NONREACTIVE

## 2023-11-04 LAB — PROTEIN / CREATININE RATIO, URINE
Creatinine, Urine: 227 mg/dL
Protein Creatinine Ratio: 0.03 mg/mg{creat} (ref 0.00–0.15)
Total Protein, Urine: 7 mg/dL

## 2023-11-04 LAB — HEPATITIS B SURFACE ANTIGEN: Hepatitis B Surface Ag: NONREACTIVE

## 2023-11-05 ENCOUNTER — Ambulatory Visit: Payer: Self-pay | Admitting: Student

## 2023-11-05 LAB — PARATHYROID HORMONE, INTACT (NO CA): PTH: 72 pg/mL — ABNORMAL HIGH (ref 15–65)

## 2023-11-05 LAB — MICROALBUMIN / CREATININE URINE RATIO
Creatinine, Urine: 200.1 mg/dL
Microalb Creat Ratio: 6 mg/g{creat} (ref 0–29)
Microalb, Ur: 12.8 ug/mL — ABNORMAL HIGH

## 2023-11-05 LAB — MISC LABCORP TEST (SEND OUT): Labcorp test code: 121265

## 2023-11-05 LAB — HEPATITIS B CORE ANTIBODY, TOTAL: HEP B CORE AB: NEGATIVE

## 2023-11-05 LAB — HCV AB W REFLEX TO QUANT PCR: HCV Ab: NONREACTIVE

## 2023-11-05 LAB — HCV INTERPRETATION

## 2023-11-06 DIAGNOSIS — N189 Chronic kidney disease, unspecified: Secondary | ICD-10-CM | POA: Diagnosis not present

## 2023-11-06 DIAGNOSIS — I700 Atherosclerosis of aorta: Secondary | ICD-10-CM | POA: Diagnosis not present

## 2023-11-06 DIAGNOSIS — I5032 Chronic diastolic (congestive) heart failure: Secondary | ICD-10-CM | POA: Diagnosis not present

## 2023-11-06 DIAGNOSIS — D631 Anemia in chronic kidney disease: Secondary | ICD-10-CM | POA: Diagnosis not present

## 2023-11-06 DIAGNOSIS — E7800 Pure hypercholesterolemia, unspecified: Secondary | ICD-10-CM | POA: Diagnosis not present

## 2023-11-06 DIAGNOSIS — I340 Nonrheumatic mitral (valve) insufficiency: Secondary | ICD-10-CM | POA: Diagnosis not present

## 2023-11-06 DIAGNOSIS — Z8616 Personal history of COVID-19: Secondary | ICD-10-CM | POA: Diagnosis not present

## 2023-11-06 DIAGNOSIS — Z9181 History of falling: Secondary | ICD-10-CM | POA: Diagnosis not present

## 2023-11-06 DIAGNOSIS — E1122 Type 2 diabetes mellitus with diabetic chronic kidney disease: Secondary | ICD-10-CM | POA: Diagnosis not present

## 2023-11-06 DIAGNOSIS — Z7982 Long term (current) use of aspirin: Secondary | ICD-10-CM | POA: Diagnosis not present

## 2023-11-06 DIAGNOSIS — D509 Iron deficiency anemia, unspecified: Secondary | ICD-10-CM | POA: Diagnosis not present

## 2023-11-06 DIAGNOSIS — I2510 Atherosclerotic heart disease of native coronary artery without angina pectoris: Secondary | ICD-10-CM | POA: Diagnosis not present

## 2023-11-06 DIAGNOSIS — S31000D Unspecified open wound of lower back and pelvis without penetration into retroperitoneum, subsequent encounter: Secondary | ICD-10-CM | POA: Diagnosis not present

## 2023-11-06 DIAGNOSIS — I110 Hypertensive heart disease with heart failure: Secondary | ICD-10-CM | POA: Diagnosis not present

## 2023-11-06 DIAGNOSIS — Z8619 Personal history of other infectious and parasitic diseases: Secondary | ICD-10-CM | POA: Diagnosis not present

## 2023-11-06 DIAGNOSIS — F419 Anxiety disorder, unspecified: Secondary | ICD-10-CM | POA: Diagnosis not present

## 2023-11-08 ENCOUNTER — Telehealth: Payer: Self-pay | Admitting: Cardiology

## 2023-11-08 DIAGNOSIS — J069 Acute upper respiratory infection, unspecified: Secondary | ICD-10-CM | POA: Diagnosis not present

## 2023-11-08 DIAGNOSIS — E1165 Type 2 diabetes mellitus with hyperglycemia: Secondary | ICD-10-CM | POA: Diagnosis not present

## 2023-11-08 DIAGNOSIS — L03116 Cellulitis of left lower limb: Secondary | ICD-10-CM | POA: Diagnosis not present

## 2023-11-08 DIAGNOSIS — R059 Cough, unspecified: Secondary | ICD-10-CM | POA: Diagnosis not present

## 2023-11-08 DIAGNOSIS — Z299 Encounter for prophylactic measures, unspecified: Secondary | ICD-10-CM | POA: Diagnosis not present

## 2023-11-08 MED ORDER — TORSEMIDE 20 MG PO TABS: 20.0000 mg | ORAL_TABLET | ORAL | Status: DC

## 2023-11-08 NOTE — Telephone Encounter (Addendum)
 Spoke with wife & patient - states this has been going on x 2 weeks.  Both legs tight w/ weeping.  States that he is not really able to weigh due to not being able to stand very good.  Did see his pcp & Dr. Rosamond told him to take extra fluid pills x 2-3 days.  States that did help & the weeping stopped.  States that over the weekend he had fever & chills - did not want to go to ED as pcp suggested - had a car visit with pcp & was checked for covid (negative) - was given Levaquin x 7 days.  BP was 106/60  94.  Is scheduled to see Dr. Rachele this Wednesday.  Also, instructed him to decrease Torsemide  to 20mg  every other day. Next f/u is scheduled for 12/28/23 with Almarie Crate, NP in [place] office.

## 2023-11-08 NOTE — Telephone Encounter (Signed)
 Notified, copy to pcp. Wife states he is scheduled to see Rachele bailer Wednesday.

## 2023-11-08 NOTE — Telephone Encounter (Signed)
 Pt c/o swelling/edema: STAT if pt has developed SOB within 24 hours  If swelling, where is the swelling located?   Both legs, with weeping and tight  How much weight have you gained and in what time span?   Yes - unsure how much  Have you gained 2 pounds in a day or 5 pounds in a week?   Do you have a log of your daily weights (if so, list)?   Are you currently taking a fluid pill?  Yes - Torsemide   Are you currently SOB?  Yes when moving around  Have you traveled recently in a car or plane for an extended period of time?   No  Caller [place]) is concerned regarding patient has swelling in legs.  Caller stated patient is unable to stand long enough to be weighed.  Caller stated patient coughs a lot while laying in the bed.  Caller stated patient is unsure how he should be taking his fluid pill.  Caller stated patient's wife has to prop his legs up or they will turn purple.  Caller noted patient needs clarification on how he should be taking his Rosuvatin.  Caller noted can call her cell [phone] or leave secure message at [phone].

## 2023-11-08 NOTE — Addendum Note (Signed)
 Addended by: Fatuma Dowers G on: 11/08/2023 04:47 PM   Modules accepted: Orders

## 2023-11-08 NOTE — Telephone Encounter (Signed)
-----   Message from Laymon CHRISTELLA Qua sent at 11/05/2023  8:54 PM EDT ----- Covering for Almarie - Please let the patient know his kidney function has further worsened since 10/2023. Creatinine was at 2.38 then and now up to 2.71. Would reduce Torsemide  from 20mg  daily to  20mg  every other day. By review of the chart, he is followed by Dr. Rachele and would make sure he has close follow-up as Dr. Rachele may wish to make additional changes given his worsening renal  function.  ----- Message ----- From: Interface, Lab In [place] Sent: 11/04/2023  12:40 PM EDT To: Almarie Crate, NP

## 2023-11-09 NOTE — Telephone Encounter (Signed)
 Patient notified and verbalized understanding.

## 2023-11-10 ENCOUNTER — Inpatient Hospital Stay (HOSPITAL_COMMUNITY)
Admission: EM | Admit: 2023-11-10 | Discharge: 2023-11-19 | DRG: 603 | Disposition: A | Discharge status: Home Health | Source: Ambulatory Visit | Attending: Family Medicine | Admitting: Family Medicine

## 2023-11-10 ENCOUNTER — Other Ambulatory Visit: Payer: Self-pay

## 2023-11-10 ENCOUNTER — Emergency Department (HOSPITAL_COMMUNITY)

## 2023-11-10 ENCOUNTER — Encounter (HOSPITAL_COMMUNITY): Payer: Self-pay

## 2023-11-10 DIAGNOSIS — R0603 Acute respiratory distress: Secondary | ICD-10-CM | POA: Diagnosis present

## 2023-11-10 DIAGNOSIS — I2510 Atherosclerotic heart disease of native coronary artery without angina pectoris: Secondary | ICD-10-CM | POA: Diagnosis present

## 2023-11-10 DIAGNOSIS — N1832 Chronic kidney disease, stage 3b: Secondary | ICD-10-CM | POA: Diagnosis present

## 2023-11-10 DIAGNOSIS — N179 Acute kidney failure, unspecified: Secondary | ICD-10-CM | POA: Diagnosis present

## 2023-11-10 DIAGNOSIS — I872 Venous insufficiency (chronic) (peripheral): Secondary | ICD-10-CM | POA: Diagnosis present

## 2023-11-10 DIAGNOSIS — N189 Chronic kidney disease, unspecified: Secondary | ICD-10-CM | POA: Diagnosis not present

## 2023-11-10 DIAGNOSIS — Z888 Allergy status to other drugs, medicaments and biological substances status: Secondary | ICD-10-CM

## 2023-11-10 DIAGNOSIS — I480 Paroxysmal atrial fibrillation: Secondary | ICD-10-CM | POA: Diagnosis present

## 2023-11-10 DIAGNOSIS — Z794 Long term (current) use of insulin: Secondary | ICD-10-CM

## 2023-11-10 DIAGNOSIS — I509 Heart failure, unspecified: Secondary | ICD-10-CM | POA: Diagnosis not present

## 2023-11-10 DIAGNOSIS — I517 Cardiomegaly: Secondary | ICD-10-CM | POA: Diagnosis not present

## 2023-11-10 DIAGNOSIS — I5032 Chronic diastolic (congestive) heart failure: Secondary | ICD-10-CM | POA: Diagnosis not present

## 2023-11-10 DIAGNOSIS — F419 Anxiety disorder, unspecified: Secondary | ICD-10-CM | POA: Diagnosis present

## 2023-11-10 DIAGNOSIS — I959 Hypotension, unspecified: Secondary | ICD-10-CM | POA: Diagnosis present

## 2023-11-10 DIAGNOSIS — D472 Monoclonal gammopathy: Secondary | ICD-10-CM | POA: Diagnosis not present

## 2023-11-10 DIAGNOSIS — I10 Essential (primary) hypertension: Secondary | ICD-10-CM | POA: Diagnosis not present

## 2023-11-10 DIAGNOSIS — D638 Anemia in other chronic diseases classified elsewhere: Secondary | ICD-10-CM | POA: Diagnosis present

## 2023-11-10 DIAGNOSIS — R600 Localized edema: Secondary | ICD-10-CM | POA: Diagnosis not present

## 2023-11-10 DIAGNOSIS — Z7401 Bed confinement status: Secondary | ICD-10-CM

## 2023-11-10 DIAGNOSIS — J9601 Acute respiratory failure with hypoxia: Secondary | ICD-10-CM | POA: Diagnosis not present

## 2023-11-10 DIAGNOSIS — R5381 Other malaise: Secondary | ICD-10-CM | POA: Diagnosis not present

## 2023-11-10 DIAGNOSIS — L0390 Cellulitis, unspecified: Principal | ICD-10-CM | POA: Diagnosis present

## 2023-11-10 DIAGNOSIS — E1122 Type 2 diabetes mellitus with diabetic chronic kidney disease: Secondary | ICD-10-CM | POA: Diagnosis not present

## 2023-11-10 DIAGNOSIS — E1165 Type 2 diabetes mellitus with hyperglycemia: Secondary | ICD-10-CM | POA: Diagnosis not present

## 2023-11-10 DIAGNOSIS — L03115 Cellulitis of right lower limb: Secondary | ICD-10-CM | POA: Diagnosis not present

## 2023-11-10 DIAGNOSIS — E11649 Type 2 diabetes mellitus with hypoglycemia without coma: Secondary | ICD-10-CM | POA: Diagnosis present

## 2023-11-10 DIAGNOSIS — L03116 Cellulitis of left lower limb: Principal | ICD-10-CM | POA: Diagnosis present

## 2023-11-10 DIAGNOSIS — Z8679 Personal history of other diseases of the circulatory system: Secondary | ICD-10-CM

## 2023-11-10 DIAGNOSIS — Z886 Allergy status to analgesic agent status: Secondary | ICD-10-CM

## 2023-11-10 DIAGNOSIS — Z6831 Body mass index (BMI) 31.0-31.9, adult: Secondary | ICD-10-CM

## 2023-11-10 DIAGNOSIS — R531 Weakness: Secondary | ICD-10-CM | POA: Diagnosis not present

## 2023-11-10 DIAGNOSIS — Z09 Encounter for follow-up examination after completed treatment for conditions other than malignant neoplasm: Secondary | ICD-10-CM | POA: Diagnosis not present

## 2023-11-10 DIAGNOSIS — Z8711 Personal history of peptic ulcer disease: Secondary | ICD-10-CM

## 2023-11-10 DIAGNOSIS — E66811 Obesity, class 1: Secondary | ICD-10-CM | POA: Diagnosis present

## 2023-11-10 DIAGNOSIS — M7989 Other specified soft tissue disorders: Secondary | ICD-10-CM | POA: Diagnosis not present

## 2023-11-10 DIAGNOSIS — Z7982 Long term (current) use of aspirin: Secondary | ICD-10-CM

## 2023-11-10 DIAGNOSIS — Z87891 Personal history of nicotine dependence: Secondary | ICD-10-CM

## 2023-11-10 DIAGNOSIS — C884 Extranodal marginal zone b-cell lymphoma of mucosa-associated lymphoid tissue (malt-lymphoma) not having achieved remission: Secondary | ICD-10-CM | POA: Diagnosis present

## 2023-11-10 DIAGNOSIS — I130 Hypertensive heart and chronic kidney disease with heart failure and stage 1 through stage 4 chronic kidney disease, or unspecified chronic kidney disease: Secondary | ICD-10-CM | POA: Diagnosis present

## 2023-11-10 DIAGNOSIS — Z823 Family history of stroke: Secondary | ICD-10-CM

## 2023-11-10 DIAGNOSIS — R918 Other nonspecific abnormal finding of lung field: Secondary | ICD-10-CM | POA: Diagnosis not present

## 2023-11-10 DIAGNOSIS — I252 Old myocardial infarction: Secondary | ICD-10-CM

## 2023-11-10 DIAGNOSIS — I700 Atherosclerosis of aorta: Secondary | ICD-10-CM | POA: Diagnosis not present

## 2023-11-10 DIAGNOSIS — K219 Gastro-esophageal reflux disease without esophagitis: Secondary | ICD-10-CM | POA: Diagnosis present

## 2023-11-10 DIAGNOSIS — Z515 Encounter for palliative care: Secondary | ICD-10-CM

## 2023-11-10 DIAGNOSIS — Z7189 Other specified counseling: Secondary | ICD-10-CM | POA: Diagnosis not present

## 2023-11-10 DIAGNOSIS — Z8572 Personal history of non-Hodgkin lymphomas: Secondary | ICD-10-CM

## 2023-11-10 DIAGNOSIS — E782 Mixed hyperlipidemia: Secondary | ICD-10-CM | POA: Diagnosis present

## 2023-11-10 DIAGNOSIS — Z96652 Presence of left artificial knee joint: Secondary | ICD-10-CM | POA: Diagnosis present

## 2023-11-10 DIAGNOSIS — Z833 Family history of diabetes mellitus: Secondary | ICD-10-CM

## 2023-11-10 DIAGNOSIS — E876 Hypokalemia: Secondary | ICD-10-CM | POA: Diagnosis not present

## 2023-11-10 DIAGNOSIS — Z7984 Long term (current) use of oral hypoglycemic drugs: Secondary | ICD-10-CM

## 2023-11-10 DIAGNOSIS — Z79899 Other long term (current) drug therapy: Secondary | ICD-10-CM

## 2023-11-10 DIAGNOSIS — Z66 Do not resuscitate: Secondary | ICD-10-CM | POA: Diagnosis present

## 2023-11-10 LAB — CBC WITH DIFFERENTIAL/PLATELET
Abs Immature Granulocytes: 0.02 K/uL (ref 0.00–0.07)
Basophils Absolute: 0 K/uL (ref 0.0–0.1)
Basophils Relative: 0 %
Eosinophils Absolute: 0.1 K/uL (ref 0.0–0.5)
Eosinophils Relative: 2 %
HCT: 37.9 % — ABNORMAL LOW (ref 39.0–52.0)
Hemoglobin: 12.6 g/dL — ABNORMAL LOW (ref 13.0–17.0)
Immature Granulocytes: 0 %
Lymphocytes Relative: 24 %
Lymphs Abs: 2 K/uL (ref 0.7–4.0)
MCH: 31 pg (ref 26.0–34.0)
MCHC: 33.2 g/dL (ref 30.0–36.0)
MCV: 93.3 fL (ref 80.0–100.0)
Monocytes Absolute: 0.5 K/uL (ref 0.1–1.0)
Monocytes Relative: 6 %
Neutro Abs: 5.6 K/uL (ref 1.7–7.7)
Neutrophils Relative %: 68 %
Platelets: 270 K/uL (ref 150–400)
RBC: 4.06 MIL/uL — ABNORMAL LOW (ref 4.22–5.81)
RDW: 13.6 % (ref 11.5–15.5)
WBC: 8.3 K/uL (ref 4.0–10.5)
nRBC: 0 % (ref 0.0–0.2)

## 2023-11-10 LAB — COMPREHENSIVE METABOLIC PANEL WITH GFR
ALT: 15 U/L (ref 0–44)
AST: 20 U/L (ref 15–41)
Albumin: 2.9 g/dL — ABNORMAL LOW (ref 3.5–5.0)
Alkaline Phosphatase: 102 U/L (ref 38–126)
Anion gap: 10 (ref 5–15)
BUN: 76 mg/dL — ABNORMAL HIGH (ref 8–23)
CO2: 27 mmol/L (ref 22–32)
Calcium: 8.8 mg/dL — ABNORMAL LOW (ref 8.9–10.3)
Chloride: 100 mmol/L (ref 98–111)
Creatinine, Ser: 2.7 mg/dL — ABNORMAL HIGH (ref 0.61–1.24)
GFR, Estimated: 22 mL/min — ABNORMAL LOW (ref >60)
Glucose, Bld: 188 mg/dL — ABNORMAL HIGH (ref 70–99)
Potassium: 3.9 mmol/L (ref 3.5–5.1)
Sodium: 137 mmol/L (ref 135–145)
Total Bilirubin: 0.5 mg/dL (ref 0.0–1.2)
Total Protein: 9.2 g/dL — ABNORMAL HIGH (ref 6.5–8.1)

## 2023-11-10 LAB — URINALYSIS, W/ REFLEX TO CULTURE (INFECTION SUSPECTED)
Bacteria, UA: NONE SEEN
Bilirubin Urine: NEGATIVE
Glucose, UA: NEGATIVE mg/dL
Hgb urine dipstick: NEGATIVE
Ketones, ur: NEGATIVE mg/dL
Leukocytes,Ua: NEGATIVE
Nitrite: NEGATIVE
Protein, ur: NEGATIVE mg/dL
Specific Gravity, Urine: 1.01 (ref 1.005–1.030)
pH: 5 (ref 5.0–8.0)

## 2023-11-10 LAB — GLUCOSE, CAPILLARY
Glucose-Capillary: 205 mg/dL — ABNORMAL HIGH (ref 70–99)
Glucose-Capillary: 137 mg/dL — ABNORMAL HIGH (ref 70–99)
Glucose-Capillary: 211 mg/dL — ABNORMAL HIGH (ref 70–99)

## 2023-11-10 LAB — CREATININE, URINE, RANDOM: Creatinine, Urine: 55 mg/dL

## 2023-11-10 LAB — MRSA NEXT GEN BY PCR, NASAL: MRSA by PCR Next Gen: NOT DETECTED

## 2023-11-10 LAB — LACTIC ACID, PLASMA: Lactic Acid, Venous: 1.7 mmol/L (ref 0.5–1.9)

## 2023-11-10 LAB — PROTIME-INR
INR: 1.1 (ref 0.8–1.2)
Prothrombin Time: 14.5 s (ref 11.4–15.2)

## 2023-11-10 LAB — SODIUM, URINE, RANDOM: Sodium, Ur: 88 mmol/L

## 2023-11-10 MED ORDER — HEPARIN SODIUM (PORCINE) 5000 UNIT/ML IJ SOLN
5000.0000 [IU] | Freq: Three times a day (TID) | INTRAMUSCULAR | Status: DC
  Administered 2023-11-10 – 2023-11-11 (×2): 5000 [IU] via SUBCUTANEOUS
  Filled 2023-11-10 (×2): qty 1

## 2023-11-10 MED ORDER — ROPINIROLE HCL 0.25 MG PO TABS
0.2500 mg | ORAL_TABLET | Freq: Every day | ORAL | Status: DC
  Administered 2023-11-10 – 2023-11-18 (×9): 0.25 mg via ORAL
  Filled 2023-11-10 (×9): qty 1

## 2023-11-10 MED ORDER — OXYCODONE HCL 5 MG PO TABS
2.5000 mg | ORAL_TABLET | Freq: Four times a day (QID) | ORAL | Status: DC | PRN
  Administered 2023-11-10: 2.5 mg via ORAL
  Administered 2023-11-11 – 2023-11-18 (×7): 5 mg via ORAL
  Filled 2023-11-10 (×8): qty 1

## 2023-11-10 MED ORDER — PROCHLORPERAZINE EDISYLATE 10 MG/2ML IJ SOLN: 10.0000 mg | Freq: Four times a day (QID) | INTRAMUSCULAR | Status: DC | PRN

## 2023-11-10 MED ORDER — VANCOMYCIN HCL 1500 MG/300ML IV SOLN
1500.0000 mg | INTRAVENOUS | Status: DC
  Administered 2023-11-12: 1500 mg via INTRAVENOUS
  Filled 2023-11-10: qty 300

## 2023-11-10 MED ORDER — SODIUM CHLORIDE 0.9 % IV SOLN
2.0000 g | Freq: Once | INTRAVENOUS | Status: AC
  Administered 2023-11-10: 2 g via INTRAVENOUS
  Filled 2023-11-10: qty 20

## 2023-11-10 MED ORDER — INSULIN ASPART 100 UNIT/ML IJ SOLN
0.0000 [IU] | Freq: Every day | INTRAMUSCULAR | Status: DC
  Administered 2023-11-10 – 2023-11-11 (×2): 2 [IU] via SUBCUTANEOUS
  Administered 2023-11-12: 3 [IU] via SUBCUTANEOUS
  Administered 2023-11-13: 2 [IU] via SUBCUTANEOUS
  Administered 2023-11-14 – 2023-11-15 (×2): 3 [IU] via SUBCUTANEOUS
  Administered 2023-11-16 – 2023-11-17 (×2): 4 [IU] via SUBCUTANEOUS
  Administered 2023-11-18: 5 [IU] via SUBCUTANEOUS

## 2023-11-10 MED ORDER — TORSEMIDE 20 MG PO TABS
20.0000 mg | ORAL_TABLET | Freq: Every day | ORAL | Status: DC
  Administered 2023-11-11 – 2023-11-12 (×2): 20 mg via ORAL
  Filled 2023-11-10 (×3): qty 1

## 2023-11-10 MED ORDER — CHLORHEXIDINE GLUCONATE CLOTH 2 % EX PADS
6.0000 | MEDICATED_PAD | Freq: Every day | CUTANEOUS | Status: DC
  Administered 2023-11-11 – 2023-11-14 (×4): 6 via TOPICAL

## 2023-11-10 MED ORDER — ALBUMIN HUMAN 25 % IV SOLN
50.0000 g | Freq: Four times a day (QID) | INTRAVENOUS | Status: AC
  Administered 2023-11-10 – 2023-11-11 (×4): 50 g via INTRAVENOUS
  Filled 2023-11-10 (×4): qty 200

## 2023-11-10 MED ORDER — ACETAMINOPHEN 325 MG PO TABS
650.0000 mg | ORAL_TABLET | Freq: Four times a day (QID) | ORAL | Status: DC | PRN
  Administered 2023-11-10 – 2023-11-14 (×2): 650 mg via ORAL
  Filled 2023-11-10 (×2): qty 2

## 2023-11-10 MED ORDER — LORAZEPAM 0.5 MG PO TABS
0.5000 mg | ORAL_TABLET | Freq: Every evening | ORAL | Status: DC | PRN
  Administered 2023-11-10 – 2023-11-18 (×8): 0.5 mg via ORAL
  Filled 2023-11-10 (×8): qty 1

## 2023-11-10 MED ORDER — VANCOMYCIN HCL 2000 MG/400ML IV SOLN
2000.0000 mg | Freq: Once | INTRAVENOUS | Status: AC
  Administered 2023-11-10: 2000 mg via INTRAVENOUS
  Filled 2023-11-10: qty 400

## 2023-11-10 MED ORDER — INSULIN ASPART 100 UNIT/ML IJ SOLN
0.0000 [IU] | Freq: Three times a day (TID) | INTRAMUSCULAR | Status: DC
  Administered 2023-11-10: 1 [IU] via SUBCUTANEOUS
  Administered 2023-11-11 – 2023-11-12 (×3): 2 [IU] via SUBCUTANEOUS
  Administered 2023-11-12: 1 [IU] via SUBCUTANEOUS
  Administered 2023-11-12 – 2023-11-13 (×2): 2 [IU] via SUBCUTANEOUS
  Administered 2023-11-13: 3 [IU] via SUBCUTANEOUS
  Administered 2023-11-13 – 2023-11-14 (×2): 2 [IU] via SUBCUTANEOUS
  Administered 2023-11-14 – 2023-11-15 (×3): 3 [IU] via SUBCUTANEOUS
  Administered 2023-11-15: 5 [IU] via SUBCUTANEOUS
  Administered 2023-11-15: 3 [IU] via SUBCUTANEOUS
  Administered 2023-11-16: 5 [IU] via SUBCUTANEOUS
  Administered 2023-11-16 (×2): 3 [IU] via SUBCUTANEOUS
  Administered 2023-11-17: 5 [IU] via SUBCUTANEOUS
  Administered 2023-11-17 (×2): 3 [IU] via SUBCUTANEOUS
  Administered 2023-11-18 (×2): 2 [IU] via SUBCUTANEOUS
  Administered 2023-11-18: 3 [IU] via SUBCUTANEOUS
  Administered 2023-11-19 (×2): 5 [IU] via SUBCUTANEOUS

## 2023-11-10 MED ORDER — TAMSULOSIN HCL 0.4 MG PO CAPS
0.4000 mg | ORAL_CAPSULE | Freq: Every day | ORAL | Status: DC
  Administered 2023-11-11 – 2023-11-19 (×9): 0.4 mg via ORAL
  Filled 2023-11-10 (×9): qty 1

## 2023-11-10 MED ORDER — ROSUVASTATIN CALCIUM 10 MG PO TABS
10.0000 mg | ORAL_TABLET | Freq: Every day | ORAL | Status: DC
  Administered 2023-11-10 – 2023-11-17 (×8): 10 mg via ORAL
  Filled 2023-11-10 (×9): qty 1

## 2023-11-10 MED ORDER — SODIUM CHLORIDE 0.9 % IV SOLN
2.0000 g | INTRAVENOUS | Status: DC
  Administered 2023-11-11 – 2023-11-17 (×7): 2 g via INTRAVENOUS
  Filled 2023-11-10 (×8): qty 20

## 2023-11-10 MED ORDER — ACETAMINOPHEN 650 MG RE SUPP: 650.0000 mg | Freq: Four times a day (QID) | RECTAL | Status: DC | PRN

## 2023-11-10 MED ORDER — MIDODRINE HCL 5 MG PO TABS
5.0000 mg | ORAL_TABLET | Freq: Three times a day (TID) | ORAL | Status: DC
  Administered 2023-11-10 – 2023-11-19 (×27): 5 mg via ORAL
  Filled 2023-11-10 (×27): qty 1

## 2023-11-10 MED ORDER — METOLAZONE 5 MG PO TABS
2.5000 mg | ORAL_TABLET | ORAL | Status: DC
  Administered 2023-11-11 – 2023-11-18 (×3): 2.5 mg via ORAL
  Filled 2023-11-10 (×4): qty 1

## 2023-11-10 MED ORDER — ASPIRIN 81 MG PO TBEC
81.0000 mg | DELAYED_RELEASE_TABLET | Freq: Every day | ORAL | Status: DC
  Administered 2023-11-11 – 2023-11-19 (×9): 81 mg via ORAL
  Filled 2023-11-10 (×9): qty 1

## 2023-11-10 NOTE — ED Triage Notes (Signed)
 Patient sent by PCP due to toes being black and left leg swelling and redness.  Patient is a diabetic.  Leg has been swollen 1-2 weeks and it was weeping Patient has had fever and tachy.  Placed on levaquin but not getting better.

## 2023-11-10 NOTE — ED Notes (Signed)
 Hospitalist at bedside

## 2023-11-10 NOTE — TOC CM/SW Note (Signed)
 Transition of Care [facility]) - Inpatient Brief Assessment   Patient Details  Name: Bradley Sheppard MRN: [id number] Date of Birth: 09-Feb-1941  Transition of Care [facility]) CM/SW Contact:    Bradley Sheppard, LCSWA Phone Number: 11/10/2023, 3:40 PM   Clinical Narrative:  Transition of Care Department [facility]) has reviewed patient and no TOC needs have been identified at this time. We will continue to monitor patient advancement through interdisciplinary progression rounds. If new patient transition needs arise, please place a TOC consult.  Transition of Care Asessment: Insurance and Status: Insurance coverage has been reviewed Patient has primary care physician: Yes Home environment has been reviewed: Single Family Home with spouse Prior level of function:: Independent Prior/Current Home Services: No current home services Social Drivers of Health Review: SDOH reviewed no interventions necessary Readmission risk has been reviewed: Yes Transition of care needs: no transition of care needs at this time

## 2023-11-10 NOTE — Progress Notes (Signed)
 Pharmacy Antibiotic Note  Bradley Sheppard is a 83 y.o. male admitted on 11/10/2023 with cellulitis.  Pharmacy has been consulted for vancomycin  dosing.  Plan: Vancomycin  2000 mg IV x 1 dose. Vancomycin  1500 mg IV every 48 hours. Monitor labs, c/s, and vanco levels as indicated.  Height: 6' (182.9 cm) Weight: 97.5 kg (215 lb) IBW/kg (Calculated) : 77.6  Temp (24hrs), Avg:98 F (36.7 C), Min:98 F (36.7 C), Max:98 F (36.7 C)  Recent Labs  Lab 11/04/23 1205 11/04/23 1215 11/10/23 1238  WBC 9.0  --  8.3  CREATININE 2.65* 2.71* 2.70*  LATICACIDVEN  --   --  1.7    Estimated Creatinine Clearance: 24.2 mL/min (A) (by C-G formula based on SCr of 2.7 mg/dL (H)).    Allergies  Allergen Reactions   Nsaids Other (See Comments)    On blood thinners with h/o IDA/bleeding   Prednisone Other (See Comments)    Makes him feel faint, hyperglycemic     Antimicrobials this admission: Vanco 7/9 CTX 7/9  Microbiology results: 7/9 BCx: pending MRSA PCR: Pending  Thank you for allowing pharmacy to be a part of this patient's care.  Elspeth Sour, PharmD Clinical Pharmacist 11/10/2023 3:50 PM

## 2023-11-10 NOTE — ED Provider Notes (Signed)
 [place] EMERGENCY DEPARTMENT AT [facility] Provider Note   CSN: [id number] Arrival date & time: 11/10/23  1206     Patient presents with: Leg Swelling and diabetic foot   Bradley Sheppard is a 83 y.o. male.   Patient has a history of coronary artery disease and kidney disease.  He has been treated with antibiotics for possible cellulitis to left lower leg.  The swelling is getting worse and also he has some swelling in his right lower leg.  He was seen by his nephrologist today and they sent him to the hospital for further treatment  The history is provided by the patient and medical records. No language interpreter was used.  Leg Pain Location:  Leg Leg location:  L leg Pain details:    Quality:  Aching   Radiates to:  Does not radiate   Severity:  Moderate   Onset quality:  Sudden   Timing:  Constant   Progression:  Worsening Chronicity:  Recurrent Associated symptoms: no back pain and no fatigue        Prior to Admission medications   Medication Sig Start Date End Date Taking? Authorizing Provider  acetaminophen  (TYLENOL ) 500 MG tablet Take 1,000 mg by mouth every 6 (six) hours as needed for moderate pain.   Yes [provider]  aspirin  EC 81 MG tablet Take 1 tablet (81 mg total) by mouth daily with breakfast. Swallow whole. 01/12/22  Yes Emokpae, Courage, MD  Calcium  Polycarbophil (FIBER-LAX PO) Take 1 capsule by mouth daily.   Yes [provider]  glipiZIDE  (GLUCOTROL  XL) 10 MG 24 hr tablet Take 1 tablet (10 mg total) by mouth daily with breakfast. Patient taking differently: Take 10 mg by mouth 2 (two) times daily. 02/04/22  Yes Gonfa, Taye T, MD  insulin  aspart (NOVOLOG ) 100 UNIT/ML injection Inject 8 Units into the skin at bedtime.   Yes [provider]  LANTUS  SOLOSTAR 100 UNIT/ML Solostar Pen Inject 12 Units into the skin daily. 10/23/23  Yes [provider]  levofloxacin (LEVAQUIN) 500 MG tablet Take 500 mg by mouth  daily. 11/07/23  Yes [provider]  LORazepam  (ATIVAN ) 0.5 MG tablet Take 1 tablet (0.5 mg total) by mouth at bedtime as needed for anxiety or sleep. 09/10/22  Yes Bryn Bernardino NOVAK, MD  metolazone  (ZAROXOLYN ) 5 MG tablet Take 2.5 mg by mouth 2 (two) times a week. 05/20/22  Yes [provider]  midodrine  (PROAMATINE ) 5 MG tablet TAKE 1 TABLET BY MOUTH THREE TIMES DAILY WITH MEALS. Patient taking differently: Take 5 mg by mouth 2 (two) times daily with a meal. 09/01/23  Yes Miriam Norris, NP  nitroGLYCERIN  (NITROSTAT ) 0.4 MG SL tablet DISSOLVE 1 TABLET UNDER THE TONGUE EVERY 5 MINUTES AS NEEDED FOR CHEST PAIN. DO NOT EXCEED A TOTAL OF 3 DOSES IN 15 MINUTES. 01/01/23  Yes Debera Jayson MATSU, MD  oxyCODONE  (OXY IR/ROXICODONE ) 5 MG immediate release tablet Take 0.5-1 tablets (2.5-5 mg total) by mouth every 6 (six) hours as needed for severe pain. 09/10/22  Yes Bryn Bernardino NOVAK, MD  polyethylene glycol (MIRALAX  / GLYCOLAX ) 17 g packet Take 17 g by mouth 2 (two) times daily as needed for mild constipation. 02/04/22  Yes Gonfa, Taye T, MD  potassium chloride  (KLOR-CON  M) 10 MEQ tablet Take 20 mEq by mouth daily as needed. 11/05/23  Yes [provider]  potassium chloride  (KLOR-CON ) 10 MEQ tablet Take 20 mEq by mouth daily as needed. Take with Metalozone 07/07/22  Yes [provider]  rOPINIRole  (REQUIP ) 0.25 MG tablet Take 0.25 mg by mouth at bedtime. 09/01/22  Yes [provider]  rosuvastatin  (CRESTOR ) 10 MG tablet 1 (10 mg tablet) every Wednesday 06/24/23  Yes Miriam Norris, NP  tamsulosin  (FLOMAX ) 0.4 MG CAPS capsule Take 1 capsule (0.4 mg total) by mouth daily. 05/06/23  Yes Nieves Cough, MD  torsemide  (DEMADEX ) 20 MG tablet Take 1 tablet (20 mg total) by mouth every other day. Patient taking differently: Take 20 mg by mouth daily. 11/08/23  Yes Strader, [place] M, PA-C  vitamin B-12 (CYANOCOBALAMIN ) 500 MCG tablet Take 500 mcg by mouth daily.   Yes [provider]   vitamin C  (ASCORBIC ACID ) 500 MG tablet Take 500 mg by mouth at bedtime.   Yes [provider]  Blood Pressure Monitoring (BLOOD PRESSURE CUFF) MISC 1 each by Does not apply route as directed. Dx: hypertension & heart failure 12/28/22   Debera Jayson MATSU, MD    Allergies: Nsaids and Prednisone    Review of Systems  Constitutional:  Negative for appetite change and fatigue.  HENT:  Negative for congestion, ear discharge and sinus pressure.   Eyes:  Negative for discharge.  Respiratory:  Negative for cough.   Cardiovascular:  Negative for chest pain.  Gastrointestinal:  Negative for abdominal pain and diarrhea.  Genitourinary:  Negative for frequency and hematuria.  Musculoskeletal:  Negative for back pain.       Swelling to both legs worse on the left with redness and tenderness  Skin:  Negative for rash.  Neurological:  Negative for seizures and headaches.  Psychiatric/Behavioral:  Negative for hallucinations.     Updated Vital Signs BP (!) 109/94   Pulse (!) 135   Temp 98 F (36.7 C) (Oral)   Resp (!) 21   Ht 6' (1.829 m)   Wt 97.5 kg   SpO2 94%   BMI 29.16 kg/m   Physical Exam Vitals and nursing note reviewed.  Constitutional:      Appearance: He is well-developed.  HENT:     Head: Normocephalic.     Nose: Nose normal.  Eyes:     General: No scleral icterus.    Conjunctiva/sclera: Conjunctivae normal.  Neck:     Thyroid : No thyromegaly.  Cardiovascular:     Rate and Rhythm: Normal rate and regular rhythm.     Heart sounds: No murmur heard.    No friction rub. No gallop.  Pulmonary:     Breath sounds: No stridor. No wheezing or rales.  Chest:     Chest wall: No tenderness.  Abdominal:     General: There is no distension.     Tenderness: There is no abdominal tenderness. There is no rebound.  Musculoskeletal:     Cervical back: Neck supple.     Comments: Fall and tender left leg with redness. appears to be cellulitic  Lymphadenopathy:      Cervical: No cervical adenopathy.  Skin:    Findings: No erythema or rash.  Neurological:     Mental Status: He is alert and oriented to person, place, and time.     Motor: No abnormal muscle tone.     Coordination: Coordination normal.  Psychiatric:        Behavior: Behavior normal.     (all labs ordered are listed, but only abnormal results are displayed) Labs Reviewed  COMPREHENSIVE METABOLIC PANEL WITH GFR - Abnormal; Notable for the following components:      Result Value  Glucose, Bld 188 (*)    BUN 76 (*)    Creatinine, Ser 2.70 (*)    Calcium  8.8 (*)    Total Protein 9.2 (*)    Albumin  2.9 (*)    GFR, Estimated 22 (*)    All other components within normal limits  CBC WITH DIFFERENTIAL/PLATELET - Abnormal; Notable for the following components:   RBC 4.06 (*)    Hemoglobin 12.6 (*)    HCT 37.9 (*)    All other components within normal limits  GLUCOSE, CAPILLARY - Abnormal; Notable for the following components:   Glucose-Capillary 205 (*)    All other components within normal limits  CULTURE, BLOOD (ROUTINE X 2)  CULTURE, BLOOD (ROUTINE X 2)  LACTIC ACID, PLASMA  PROTIME-INR  URINALYSIS, W/ REFLEX TO CULTURE (INFECTION SUSPECTED)  CBG MONITORING, ED    EKG: None  Radiology: US  Venous Img Lower Unilateral Left Result Date: 11/10/2023 CLINICAL DATA:  Left lower extremity swelling EXAM: LEFT LOWER EXTREMITY VENOUS DOPPLER ULTRASOUND TECHNIQUE: Gray-scale sonography with compression, as well as color and duplex ultrasound, were performed to evaluate the deep venous system(s) from the level of the common femoral vein through the popliteal and proximal calf veins. COMPARISON:  None Available. FINDINGS: VENOUS Normal compressibility of the common femoral, superficial femoral, and popliteal veins, as well as the visualized calf veins. Visualized portions of profunda femoral vein and great saphenous vein unremarkable. No filling defects to suggest DVT on grayscale or color  Doppler imaging. Doppler waveforms show normal direction of venous flow, normal respiratory plasticity and response to augmentation. Limited views of the contralateral common femoral vein are unremarkable. OTHER Edema is present within the superficial soft tissues. Limitations: none IMPRESSION: 1. No evidence of left lower extremity DVT. Electronically Signed   By: Maude Naegeli M.D.   On: 11/10/2023 14:35   DG Chest 2 View Result Date: 11/10/2023 CLINICAL DATA:  Concern for sepsis. EXAM: CHEST - 2 VIEW COMPARISON:  09/04/2022. FINDINGS: Stable cardiomegaly. Aortic atherosclerosis. 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 No focal consolidation, pleural effusion, or pneumothorax. Diffuse osseous demineralization. 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 IMPRESSION: 1. Stable cardiomegaly.  No acute cardiopulmonary findings. 2. 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 Electronically Signed   By: Harrietta Sherry M.D.   On: 11/10/2023 12:57     Procedures   Medications Ordered in the ED  cefTRIAXone  (ROCEPHIN ) 2 g in sodium chloride  0.9 % 100 mL IVPB (0 g Intravenous Stopped 11/10/23 1427)                                    Medical Decision Making Amount and/or Complexity of Data Reviewed Labs: ordered. Radiology: ordered.  Risk Decision regarding hospitalization.   Patient with anasarca and cellulitis to left lower leg.  He will be admitted to medicine     Final diagnoses:  Cellulitis, unspecified cellulitis site    ED Discharge Orders     None          Suzette Pac, MD 11/13/23 [phone]

## 2023-11-10 NOTE — H&P (Signed)
 History and Physical    Bradley Sheppard FMW:[id number] DOB: 02/12/1941 DOA: 11/10/2023  PCP: Bradley Leta NOVAK, MD   Patient coming from: Nephrology office  Chief Complaint: Left lower extremity erythema not responding to outpatient treatment  HPI: Bradley Sheppard is a 83 y.o. 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  Patient has had some fever and tachycardia and was started on Levaquin, but not improving.  He has apparently been taking Levaquin over the last 4 days with no improvement and he was seen by nephrology office today with recommendations to be hospitalized for inpatient management.  He is also noted to have worsening creatinine levels.  He has supposedly been taking his home diuretics and other medications as prescribed.   ED Course: Vital signs with elevated heart rates and EKG showing atrial fibrillation with RVR.  Hemoglobin 12.6, creatinine 2.7.  He has been started on Rocephin  and blood cultures have been collected.  Review of Systems: Reviewed as noted above, otherwise negative.  Past Medical History:  Diagnosis Date   (HFpEF) heart failure with preserved ejection fraction (HCC)    Anxiety    Aortic stenosis    Arthritis    Asthma    Chronic pain    CKD (chronic kidney disease) stage 3, GFR 30-59 ml/min (HCC)    Coronary atherosclerosis of native coronary artery    Multvessel, DES to LAD and RCA 8/03; EF 65% by echo 11/2014   DM2 (diabetes mellitus, type 2) (HCC)    Essential hypertension    Gastric ulcer    Gastritis    Related to NSAIDs   GERD (gastroesophageal reflux disease)    Iron  deficiency anemia    Mitral valve regurgitation    Mixed hyperlipidemia    Myocardial infarction [facility])    Nephrolithiasis     Past Surgical History:  Procedure  Laterality Date   APPENDECTOMY     BIOPSY  09/25/2021   Procedure: BIOPSY;  Surgeon: Shaaron Lamar HERO, MD;  Location: [facility];  Service: Endoscopy;;   CARDIAC CATHETERIZATION  01/2002   90% obstruction in proximal LAD, 60 % obstruction in proximal circumflex and 70%  in proximal RCA. LAD & RCA stented  stents x 2   COLONOSCOPY WITH PROPOFOL  N/A 09/25/2021   Procedure: COLONOSCOPY WITH PROPOFOL ;  Surgeon: Shaaron Lamar HERO, MD;  Location: [facility];  Service: Endoscopy;  Laterality: N/A;  11:00am   L knee replacement  2009   Subsequent infectino requiring resectino arthroplasty with incision and drainage 8/10, and reimplantizathion arthroplasty, 9/20. 5 total surgeries.   PARTIAL PROCTECTOMY BY TEM N/A 12/31/2021   Procedure: TEM PARTIAL PROTECTOMY;  Surgeon: Sheldon Standing, MD;  Location: [facility];  Service: General;  Laterality: N/A;     reports that he has quit smoking. His smoking use included cigarettes. He has never been exposed to tobacco smoke. He has quit using smokeless tobacco.  His smokeless tobacco use included chew. He reports that he does not drink alcohol  and does not use drugs.  Allergies  Allergen Reactions   Nsaids Other (See Comments)    On blood thinners with h/o IDA/bleeding   Prednisone Other (See Comments)    Makes him feel faint, hyperglycemic     Family History  Problem Relation Age of Onset  Diabetes Mother    Aneurysm Mother        Brain   Stroke Father    Stroke Other    Colon cancer Neg Hx     Prior to Admission medications   Medication Sig Start Date End Date Taking? Authorizing Provider  acetaminophen  (TYLENOL ) 500 MG tablet Take 1,000 mg by mouth every 6 (six) hours as needed for moderate pain.   Yes [provider]  aspirin  EC 81 MG tablet Take 1 tablet (81 mg total) by mouth daily with breakfast. Swallow whole. 01/12/22  Yes Emokpae, Courage, MD  Calcium  Polycarbophil (FIBER-LAX PO) Take 1 capsule by mouth daily.   Yes [provider]  glipiZIDE  (GLUCOTROL  XL) 10 MG 24 hr tablet Take 1 tablet (10 mg total) by mouth daily with breakfast. Patient taking differently: Take 10 mg by mouth 2 (two) times daily. 02/04/22  Yes Gonfa, Taye T, MD  insulin  aspart (NOVOLOG ) 100 UNIT/ML injection Inject 8 Units into the skin at bedtime.   Yes [provider]  LANTUS  SOLOSTAR 100 UNIT/ML Solostar Pen Inject 12 Units into the skin daily. 10/23/23  Yes [provider]  levofloxacin (LEVAQUIN) 500 MG tablet Take 500 mg by mouth daily. 11/07/23  Yes [provider]  LORazepam  (ATIVAN ) 0.5 MG tablet Take 1 tablet (0.5 mg total) by mouth at bedtime as needed for anxiety or sleep. 09/10/22  Yes Bryn Bernardino NOVAK, MD  metolazone  (ZAROXOLYN ) 5 MG tablet Take 2.5 mg by mouth 2 (two) times a week. 05/20/22  Yes [provider]  midodrine  (PROAMATINE ) 5 MG tablet TAKE 1 TABLET BY MOUTH THREE TIMES DAILY WITH MEALS. Patient taking differently: Take 5 mg by mouth 2 (two) times daily with a meal. 09/01/23  Yes Miriam Norris, NP  nitroGLYCERIN  (NITROSTAT ) 0.4 MG SL tablet DISSOLVE 1 TABLET UNDER THE TONGUE EVERY 5 MINUTES AS NEEDED FOR CHEST PAIN. DO NOT EXCEED A TOTAL OF 3 DOSES IN 15 MINUTES. 01/01/23  Yes Debera Jayson MATSU, MD  oxyCODONE  (OXY IR/ROXICODONE ) 5 MG immediate release tablet Take 0.5-1 tablets (2.5-5 mg total) by mouth every 6 (six) hours as needed for severe pain. 09/10/22  Yes Bryn Bernardino NOVAK, MD  polyethylene glycol (MIRALAX  / GLYCOLAX ) 17 g packet Take 17 g by mouth 2 (two) times daily as needed for mild constipation. 02/04/22  Yes Gonfa, Taye T, MD  potassium chloride  (KLOR-CON  M) 10 MEQ tablet Take 20 mEq by mouth daily as needed. 11/05/23  Yes [provider]  potassium chloride  (KLOR-CON ) 10 MEQ tablet Take 20 mEq by mouth daily as needed. Take with Metalozone 07/07/22  Yes [provider]  rOPINIRole  (REQUIP ) 0.25 MG tablet Take 0.25 mg by mouth at bedtime. 09/01/22  Yes [provider]  rosuvastatin  (CRESTOR ) 10 MG tablet 1 (10 mg tablet) every Wednesday 06/24/23  Yes Miriam Norris, NP  tamsulosin  (FLOMAX ) 0.4 MG CAPS capsule Take 1 capsule (0.4 mg total) by mouth daily. 05/06/23  Yes Nieves Cough, MD  torsemide  (DEMADEX ) 20 MG tablet Take 1 tablet (20 mg total) by mouth every other day. Patient taking differently: Take 20 mg by mouth daily. 11/08/23  Yes Strader, [place] M, PA-C  vitamin B-12 (CYANOCOBALAMIN ) 500 MCG tablet Take 500 mcg by mouth daily.   Yes [provider]  vitamin C  (ASCORBIC ACID ) 500 MG tablet Take 500 mg by mouth at bedtime.   Yes [provider]  Blood Pressure Monitoring (BLOOD PRESSURE CUFF) MISC 1 each by Does not apply  route as directed. Dx: hypertension & heart failure 12/28/22   Debera Jayson MATSU, MD    Physical Exam: Vitals:   11/10/23 1217 11/10/23 1330 11/10/23 1400 11/10/23 1500  BP: 106/74 116/86 (!) 109/94 106/75  Pulse: (!) 138 (!) 121 (!) 135 (!) 127  Resp: 20 (!) 21 (!) 21 14  Temp: 98 F (36.7 C)     TempSrc: Oral     SpO2: 97% 96% 94% 95%  Weight:      Height:        Constitutional: NAD, calm, comfortable Vitals:   11/10/23 1217 11/10/23 1330 11/10/23 1400 11/10/23 1500  BP: 106/74 116/86 (!) 109/94 106/75  Pulse: (!) 138 (!) 121 (!) 135 (!) 127  Resp: 20 (!) 21 (!) 21 14  Temp: 98 F (36.7 C)     TempSrc: Oral     SpO2: 97% 96% 94% 95%  Weight:      Height:       Eyes: lids and conjunctivae normal Neck: normal, supple Respiratory: clear to auscultation bilaterally. Normal respiratory effort. No accessory muscle use.  Cardiovascular: Irregular and tachycardic Abdomen: no tenderness, no distention. Bowel sounds positive.  Musculoskeletal: 2-3+ pitting edema bilaterally, erythema to left lower extremity Skin: no rashes, lesions, ulcers.  Psychiatric: Flat affect  Labs on Admission: I have personally reviewed following labs and imaging studies  CBC: Recent Labs  Lab  11/04/23 1205 11/10/23 1238  WBC 9.0 8.3  NEUTROABS  --  5.6  HGB 12.8* 12.6*  HCT 37.6* 37.9*  MCV 92.6 93.3  PLT 249 270   Basic Metabolic Panel: Recent Labs  Lab 11/04/23 1205 11/04/23 1215 11/10/23 1238  NA 133* 133* 137  K 3.7 3.8 3.9  CL 98 97* 100  CO2 26 26 27   GLUCOSE 234* 231* 188*  BUN 79* 79* 76*  CREATININE 2.65* 2.71* 2.70*  CALCIUM  8.5* 8.5* 8.8*  PHOS 3.9  --   --    GFR: Estimated Creatinine Clearance: 24.2 mL/min (A) (by C-G formula based on SCr of 2.7 mg/dL (H)). Liver Function Tests: Recent Labs  Lab 11/04/23 1205 11/10/23 1238  AST  --  20  ALT  --  15  ALKPHOS  --  102  BILITOT  --  0.5  PROT  --  9.2*  ALBUMIN  2.9* 2.9*   No results for input(s): LIPASE, AMYLASE in the last 168 hours. No results for input(s): AMMONIA in the last 168 hours. Coagulation Profile: Recent Labs  Lab 11/10/23 1238  INR 1.1   Cardiac Enzymes: No results for input(s): CKTOTAL, CKMB, CKMBINDEX, TROPONINI in the last 168 hours. BNP (last 3 results) No results for input(s): PROBNP in the last 8760 hours. HbA1C: No results for input(s): HGBA1C in the last 72 hours. CBG: Recent Labs  Lab 11/10/23 1228  GLUCAP 205*   Lipid Profile: No results for input(s): CHOL, HDL, LDLCALC, TRIG, CHOLHDL, LDLDIRECT in the last 72 hours. Thyroid  Function Tests: No results for input(s): TSH, T4TOTAL, FREET4, T3FREE, THYROIDAB in the last 72 hours. Anemia Panel: No results for input(s): VITAMINB12, FOLATE, FERRITIN, TIBC, IRON , RETICCTPCT in the last 72 hours. Urine analysis:    Component Value Date/Time   COLORURINE YELLOW 11/04/2023 1206   APPEARANCEUR CLEAR 11/04/2023 1206   APPEARANCEUR Clear 10/19/2022 1055   LABSPEC 1.014 11/04/2023 1206   PHURINE 5.0 11/04/2023 1206   GLUCOSEU NEGATIVE 11/04/2023 1206   HGBUR NEGATIVE 11/04/2023 1206   BILIRUBINUR NEGATIVE 11/04/2023 1206   BILIRUBINUR Negative 10/19/2022  1055  KETONESUR NEGATIVE 11/04/2023 1206   PROTEINUR NEGATIVE 11/04/2023 1206   UROBILINOGEN 0.2 12/26/2008 0950   NITRITE NEGATIVE 11/04/2023 1206   LEUKOCYTESUR NEGATIVE 11/04/2023 1206    Radiological Exams on Admission: US  Venous Img Lower Unilateral Left Result Date: 11/10/2023 CLINICAL DATA:  Left lower extremity swelling EXAM: LEFT LOWER EXTREMITY VENOUS DOPPLER ULTRASOUND TECHNIQUE: Gray-scale sonography with compression, as well as color and duplex ultrasound, were performed to evaluate the deep venous system(s) from the level of the common femoral vein through the popliteal and proximal calf veins. COMPARISON:  None Available. FINDINGS: VENOUS Normal compressibility of the common femoral, superficial femoral, and popliteal veins, as well as the visualized calf veins. Visualized portions of profunda femoral vein and great saphenous vein unremarkable. No filling defects to suggest DVT on grayscale or color Doppler imaging. Doppler waveforms show normal direction of venous flow, normal respiratory plasticity and response to augmentation. Limited views of the contralateral common femoral vein are unremarkable. OTHER Edema is present within the superficial soft tissues. Limitations: none IMPRESSION: 1. No evidence of left lower extremity DVT. Electronically Signed   By: Maude Naegeli M.D.   On: 11/10/2023 14:35   DG Chest 2 View Result Date: 11/10/2023 CLINICAL DATA:  Concern for sepsis. EXAM: CHEST - 2 VIEW COMPARISON:  09/04/2022. FINDINGS: Stable cardiomegaly. Aortic atherosclerosis. 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 No focal consolidation, pleural effusion, or pneumothorax. Diffuse osseous demineralization. 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 IMPRESSION: 1. Stable cardiomegaly.  No acute cardiopulmonary findings. 2. 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 Electronically Signed   By: Harrietta Sherry M.D.   On: 11/10/2023 12:57    EKG: Independently reviewed.  A-fib 123 bpm.  Assessment/Plan Principal Problem:   Cellulitis Active Problems:   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)   Essential hypertension   MALT lymphoma   Chronic kidney disease, stage 3b (HCC)   Paroxysmal atrial fibrillation (HCC)   Physical deconditioning   DNR (do not resuscitate)   History of CAD (coronary artery disease)    Left lower extremity cellulitis in the setting of venous insufficiency - Blood cultures ordered and pending - Check MRSA PCR screen - Continue on vancomycin  and Rocephin  for now - Leg elevation and Coban wraps - Continue home oral torsemide  dosing - Plan to check ABI - Left lower extremity without DVT  AKI on CKD stage IIIb - Follows with Dr. Rachele nephrology outpatient - Appears to have some anasarca with low albumin  - Likely in the setting of some hypotension as well, plan to give some albumin  for now and monitor blood pressure readings - Elevate legs - Monitor carefully with home oral torsemide  dosing - Monitor strict I's and O's and consider nephrology evaluation by a.m.  Chronic HFpEF -No symptoms of dyspnea or orthopnea noted at this time, but volume overloaded -Chest x-ray with stable cardiomegaly - Echo on 09/2022 with EF 55-60% - Plan to continue home diuretics with use of albumin   Type 2 diabetes - SSI and carb modified diet  PAF with RVR - Not on anticoagulation due to rectal bleeding and weakness predisposing to falls - Follows with Dr. Debera outpatient - Coreg  held due to lower blood pressure readings and takes midodrine  for blood pressure support - Patient not a candidate for DCCV  CAD - History of DES to LAD and RCA in 2003 - Continue aspirin , and rosuvastatin , no Coreg   due to hypotension  MALT lymphoma - Follows with Dr.  Rogers - Outpatient referral to palliative care previously discussed   DVT prophylaxis: Heparin  Code Status: DNR Family Communication: Wife and daughter at bedside Disposition Plan: Admit for treatment of cellulitis Consults called: Palliative care Admission status: Observation, stepdown  Severity of Illness: The appropriate patient status for this patient is OBSERVATION. Observation status is judged to be reasonable and necessary in order to provide the required intensity of service to ensure the patient's safety. The patient's presenting symptoms, physical exam findings, and initial radiographic and laboratory data in the context of their medical condition is felt to place them at decreased risk for further clinical deterioration. Furthermore, it is anticipated that the patient will be medically stable for discharge from the hospital within 2 midnights of admission.    Chaquita Basques D Maree DO [facility]  If 7PM-7AM, please contact night-coverage [email or web address]  11/10/2023, 3:49 PM

## 2023-11-10 NOTE — Plan of Care (Signed)

## 2023-11-11 ENCOUNTER — Observation Stay (HOSPITAL_COMMUNITY)

## 2023-11-11 ENCOUNTER — Encounter (HOSPITAL_COMMUNITY): Payer: Self-pay | Admitting: Internal Medicine

## 2023-11-11 DIAGNOSIS — E1122 Type 2 diabetes mellitus with diabetic chronic kidney disease: Secondary | ICD-10-CM | POA: Diagnosis not present

## 2023-11-11 DIAGNOSIS — N1832 Chronic kidney disease, stage 3b: Secondary | ICD-10-CM | POA: Diagnosis not present

## 2023-11-11 DIAGNOSIS — E876 Hypokalemia: Secondary | ICD-10-CM | POA: Diagnosis not present

## 2023-11-11 DIAGNOSIS — I2510 Atherosclerotic heart disease of native coronary artery without angina pectoris: Secondary | ICD-10-CM | POA: Diagnosis not present

## 2023-11-11 DIAGNOSIS — I509 Heart failure, unspecified: Secondary | ICD-10-CM | POA: Diagnosis not present

## 2023-11-11 DIAGNOSIS — I480 Paroxysmal atrial fibrillation: Secondary | ICD-10-CM | POA: Diagnosis not present

## 2023-11-11 DIAGNOSIS — Z794 Long term (current) use of insulin: Secondary | ICD-10-CM | POA: Diagnosis not present

## 2023-11-11 DIAGNOSIS — Z515 Encounter for palliative care: Secondary | ICD-10-CM

## 2023-11-11 DIAGNOSIS — I959 Hypotension, unspecified: Secondary | ICD-10-CM | POA: Diagnosis not present

## 2023-11-11 DIAGNOSIS — I252 Old myocardial infarction: Secondary | ICD-10-CM | POA: Diagnosis not present

## 2023-11-11 DIAGNOSIS — L0390 Cellulitis, unspecified: Secondary | ICD-10-CM | POA: Diagnosis present

## 2023-11-11 DIAGNOSIS — Z87891 Personal history of nicotine dependence: Secondary | ICD-10-CM | POA: Diagnosis not present

## 2023-11-11 DIAGNOSIS — R918 Other nonspecific abnormal finding of lung field: Secondary | ICD-10-CM | POA: Diagnosis not present

## 2023-11-11 DIAGNOSIS — E119 Type 2 diabetes mellitus without complications: Secondary | ICD-10-CM | POA: Diagnosis not present

## 2023-11-11 DIAGNOSIS — Z6831 Body mass index (BMI) 31.0-31.9, adult: Secondary | ICD-10-CM | POA: Diagnosis not present

## 2023-11-11 DIAGNOSIS — R531 Weakness: Secondary | ICD-10-CM | POA: Diagnosis not present

## 2023-11-11 DIAGNOSIS — I700 Atherosclerosis of aorta: Secondary | ICD-10-CM | POA: Diagnosis not present

## 2023-11-11 DIAGNOSIS — Z833 Family history of diabetes mellitus: Secondary | ICD-10-CM | POA: Diagnosis not present

## 2023-11-11 DIAGNOSIS — I10 Essential (primary) hypertension: Secondary | ICD-10-CM | POA: Diagnosis not present

## 2023-11-11 DIAGNOSIS — I517 Cardiomegaly: Secondary | ICD-10-CM | POA: Diagnosis not present

## 2023-11-11 DIAGNOSIS — N179 Acute kidney failure, unspecified: Secondary | ICD-10-CM | POA: Diagnosis not present

## 2023-11-11 DIAGNOSIS — Z7189 Other specified counseling: Secondary | ICD-10-CM

## 2023-11-11 DIAGNOSIS — R0603 Acute respiratory distress: Secondary | ICD-10-CM | POA: Diagnosis not present

## 2023-11-11 DIAGNOSIS — J9601 Acute respiratory failure with hypoxia: Secondary | ICD-10-CM | POA: Diagnosis not present

## 2023-11-11 DIAGNOSIS — E11649 Type 2 diabetes mellitus with hypoglycemia without coma: Secondary | ICD-10-CM | POA: Diagnosis not present

## 2023-11-11 DIAGNOSIS — L03116 Cellulitis of left lower limb: Secondary | ICD-10-CM | POA: Diagnosis not present

## 2023-11-11 DIAGNOSIS — I5032 Chronic diastolic (congestive) heart failure: Secondary | ICD-10-CM | POA: Diagnosis not present

## 2023-11-11 DIAGNOSIS — E66811 Obesity, class 1: Secondary | ICD-10-CM | POA: Diagnosis not present

## 2023-11-11 DIAGNOSIS — Z7984 Long term (current) use of oral hypoglycemic drugs: Secondary | ICD-10-CM | POA: Diagnosis not present

## 2023-11-11 DIAGNOSIS — E782 Mixed hyperlipidemia: Secondary | ICD-10-CM | POA: Diagnosis not present

## 2023-11-11 DIAGNOSIS — R600 Localized edema: Secondary | ICD-10-CM | POA: Diagnosis not present

## 2023-11-11 DIAGNOSIS — R5381 Other malaise: Secondary | ICD-10-CM | POA: Diagnosis not present

## 2023-11-11 DIAGNOSIS — E785 Hyperlipidemia, unspecified: Secondary | ICD-10-CM | POA: Diagnosis not present

## 2023-11-11 DIAGNOSIS — S31000A Unspecified open wound of lower back and pelvis without penetration into retroperitoneum, initial encounter: Secondary | ICD-10-CM | POA: Diagnosis not present

## 2023-11-11 DIAGNOSIS — D638 Anemia in other chronic diseases classified elsewhere: Secondary | ICD-10-CM | POA: Diagnosis not present

## 2023-11-11 DIAGNOSIS — I130 Hypertensive heart and chronic kidney disease with heart failure and stage 1 through stage 4 chronic kidney disease, or unspecified chronic kidney disease: Secondary | ICD-10-CM | POA: Diagnosis not present

## 2023-11-11 DIAGNOSIS — M7989 Other specified soft tissue disorders: Secondary | ICD-10-CM | POA: Diagnosis not present

## 2023-11-11 DIAGNOSIS — Z66 Do not resuscitate: Secondary | ICD-10-CM | POA: Diagnosis not present

## 2023-11-11 DIAGNOSIS — Z7982 Long term (current) use of aspirin: Secondary | ICD-10-CM | POA: Diagnosis not present

## 2023-11-11 LAB — GLUCOSE, CAPILLARY
Glucose-Capillary: 69 mg/dL — ABNORMAL LOW (ref 70–99)
Glucose-Capillary: 104 mg/dL — ABNORMAL HIGH (ref 70–99)
Glucose-Capillary: 165 mg/dL — ABNORMAL HIGH (ref 70–99)
Glucose-Capillary: 176 mg/dL — ABNORMAL HIGH (ref 70–99)
Glucose-Capillary: 241 mg/dL — ABNORMAL HIGH (ref 70–99)

## 2023-11-11 LAB — CBC
HCT: 31.1 % — ABNORMAL LOW (ref 39.0–52.0)
Hemoglobin: 9.9 g/dL — ABNORMAL LOW (ref 13.0–17.0)
MCH: 29.7 pg (ref 26.0–34.0)
MCHC: 31.8 g/dL (ref 30.0–36.0)
MCV: 93.4 fL (ref 80.0–100.0)
Platelets: 203 K/uL (ref 150–400)
RBC: 3.33 MIL/uL — ABNORMAL LOW (ref 4.22–5.81)
RDW: 13.3 % (ref 11.5–15.5)
WBC: 7 K/uL (ref 4.0–10.5)
nRBC: 0 % (ref 0.0–0.2)

## 2023-11-11 LAB — COMPREHENSIVE METABOLIC PANEL WITH GFR
ALT: 10 U/L (ref 0–44)
AST: 16 U/L (ref 15–41)
Albumin: 3.5 g/dL (ref 3.5–5.0)
Alkaline Phosphatase: 68 U/L (ref 38–126)
Anion gap: 10 (ref 5–15)
BUN: 74 mg/dL — ABNORMAL HIGH (ref 8–23)
CO2: 26 mmol/L (ref 22–32)
Calcium: 8.6 mg/dL — ABNORMAL LOW (ref 8.9–10.3)
Chloride: 103 mmol/L (ref 98–111)
Creatinine, Ser: 2.53 mg/dL — ABNORMAL HIGH (ref 0.61–1.24)
GFR, Estimated: 24 mL/min — ABNORMAL LOW (ref >60)
Glucose, Bld: 68 mg/dL — ABNORMAL LOW (ref 70–99)
Potassium: 3.2 mmol/L — ABNORMAL LOW (ref 3.5–5.1)
Sodium: 139 mmol/L (ref 135–145)
Total Bilirubin: 0.7 mg/dL (ref 0.0–1.2)
Total Protein: 7.6 g/dL (ref 6.5–8.1)

## 2023-11-11 LAB — IRON AND TIBC
Iron: 39 ug/dL — ABNORMAL LOW (ref 45–182)
Saturation Ratios: 27 % (ref 17.9–39.5)
TIBC: 147 ug/dL — ABNORMAL LOW (ref 250–450)
UIBC: 108 ug/dL

## 2023-11-11 LAB — HEMOGLOBIN A1C
Hgb A1c MFr Bld: 8.4 % — ABNORMAL HIGH (ref 4.8–5.6)
Mean Plasma Glucose: 194 mg/dL

## 2023-11-11 LAB — RETICULOCYTES
Immature Retic Fract: 11.4 % (ref 2.3–15.9)
RBC.: 3.31 MIL/uL — ABNORMAL LOW (ref 4.22–5.81)
Retic Count, Absolute: 36.7 K/uL (ref 19.0–186.0)
Retic Ct Pct: 1.1 % (ref 0.4–3.1)

## 2023-11-11 LAB — HEMOGLOBIN AND HEMATOCRIT, BLOOD
HCT: 31 % — ABNORMAL LOW (ref 39.0–52.0)
Hemoglobin: 10 g/dL — ABNORMAL LOW (ref 13.0–17.0)

## 2023-11-11 LAB — FERRITIN: Ferritin: 134 ng/mL (ref 24–336)

## 2023-11-11 LAB — MAGNESIUM: Magnesium: 2 mg/dL (ref 1.7–2.4)

## 2023-11-11 LAB — VITAMIN B12: Vitamin B-12: 532 pg/mL (ref 180–914)

## 2023-11-11 LAB — FOLATE: Folate: 14.4 ng/mL (ref >5.9)

## 2023-11-11 MED ORDER — MELATONIN 3 MG PO TABS
6.0000 mg | ORAL_TABLET | Freq: Once | ORAL | Status: AC
  Administered 2023-11-11: 6 mg via ORAL
  Filled 2023-11-11: qty 2

## 2023-11-11 MED ORDER — POTASSIUM CHLORIDE CRYS ER 20 MEQ PO TBCR
40.0000 meq | EXTENDED_RELEASE_TABLET | Freq: Two times a day (BID) | ORAL | Status: AC
  Administered 2023-11-11 (×2): 40 meq via ORAL
  Filled 2023-11-11 (×2): qty 2

## 2023-11-11 MED ORDER — DILTIAZEM HCL 25 MG/5ML IV SOLN
5.0000 mg | Freq: Once | INTRAVENOUS | Status: AC
  Administered 2023-11-11: 5 mg via INTRAVENOUS
  Filled 2023-11-11: qty 5

## 2023-11-11 MED ORDER — POLYETHYLENE GLYCOL 3350 17 G PO PACK
17.0000 g | PACK | Freq: Every day | ORAL | Status: DC
  Administered 2023-11-11 – 2023-11-19 (×8): 17 g via ORAL
  Filled 2023-11-11 (×8): qty 1

## 2023-11-11 MED ORDER — DICLOFENAC SODIUM 1 % EX GEL
2.0000 g | Freq: Four times a day (QID) | CUTANEOUS | Status: DC
  Administered 2023-11-11 – 2023-11-19 (×29): 2 g via TOPICAL
  Filled 2023-11-11 (×4): qty 100

## 2023-11-11 MED ORDER — ORAL CARE MOUTH RINSE
15.0000 mL | OROMUCOSAL | Status: DC | PRN
Start: 2023-11-11 — End: 2023-11-19

## 2023-11-11 NOTE — Consult Note (Signed)
 Consultation Note Date: 11/11/2023   Patient Name: Bradley Sheppard  DOB: [birth year]  MRN: [id number]  Age / Sex: 83 y.o., male  PCP: Rosamond Leta NOVAK, MD Referring Physician: Maree Adron BIRCH, DO  Reason for Consultation: Establishing goals of care  HPI/Patient Profile: 83 y.o. male  with past medical history of  type 2 diabetes, hypertension, HFpEF, CKD stage IIIa, MALT lymphoma, and PAF not on anticoagulation admitted on 11/10/2023 with cellulitis of the left lower extremity, AKI on CKD 3 and chronic heart failure.   Clinical Assessment and Goals of Care: I have reviewed medical records including EPIC notes, labs and imaging, received report from RN, assessed the patient.  Mr. Richelle is lying quietly in bed.  He appears chronically ill and frail, elderly.  He greets me, making and mostly keeping eye contact.  He is alert and oriented, able to make his needs known.  His sister-in-law, Nathanel, is present at bedside and he agrees to have conversation with her present.  We meet at the bedside to discuss diagnosis prognosis, GOC, EOL wishes, disposition and options.  I introduced Palliative Medicine as specialized medical care for people living with serious illness. It focuses on providing relief from the symptoms and stress of a serious illness. The goal is to improve quality of life for both the patient and the family.  We discussed a brief life review of the patient.  Mr. Padget states that he and his wife have been married for 63 years.  He has 2 children Marty and Darice.  He states that he has had poor mobility and just transfers to toilet for the last several years.  We then focused on their current illness.   We talked about his cellulitis and the treatment plan.  We talked about time for outcomes.  We talked about his mobility and physical therapy evaluation, possible need for short-term rehab.  Mr. Meany tells me  that he has been to [facility] skilled nursing facility for rehab in the past and would not want to go back.  The natural disease trajectory and expectations at EOL were discussed.  I attempted to elicit values and goals of care important to the patient.  Mr. Bonelli tells me that he would return home if at all possible  Advanced directives, concepts specific to code status, artifical feeding and hydration, and rehospitalization were considered and discussed.  DNR verified.  Hospice and Palliative Care services outpatient were explained and offered.  We talked about the benefits of outpatient palliative services and hospice services.  Mr. Horrigan tells me that he had home health services set up through [facility] this past week therapy is coming to his home and nursing.  We talked about the benefits of at home treat the treatable hospice care for further help and support.  His sister-in-law, Nathanel, states that her husband had at home hospice care for approximately 2 months.  They also talked about a brother who went to residential hospice and died.  I encouraged Mr. Borre to consider the  benefits of at home treat the treatable hospice care  Discussed the importance of continued conversation with family and the medical providers regarding overall plan of care and treatment options, ensuring decisions are within the context of the patient's values and GOCs.  Questions and concerns were addressed.  The family was encouraged to call with questions or concerns.  PMT will continue to support holistically.  Conference with attending, bedside nursing staff, transition of care team related to patient condition, needs, goals of care, disposition.    HCPOA  NEXT OF KIN -Mr. Schinke states that his wife, Meade, is his surrogate decision-maker with the help of his children Marty and Darice.    SUMMARY OF RECOMMENDATIONS   Continue to treat the treatable but no CPR or intubation Home with home health/[facility],  already in place Encouraged to consider outpatient palliative/hospice care    Code Status/Advance Care Planning: DNR  Symptom Management:  Per hospitalist, no additional needs at this time.  Palliative Prophylaxis:  Frequent Pain Assessment and Palliative Wound Care  Additional Recommendations (Limitations, Scope, Preferences): Continue to treat but no CPR or intubation  Psycho-social/Spiritual:  Desire for further Chaplaincy support:no Additional Recommendations: Caregiving  Support/Resources and Education on Hospice  Prognosis:  < 12 months, or less would not be surprising based on advanced age, decreased functional status, poor mobility.  Discharge Planning: Home with home health/[facility] if at all possible      Primary Diagnoses: Present on Admission:  Cellulitis  Chronic kidney disease, stage 3b (HCC)  DNR (do not resuscitate)  Essential hypertension  MALT lymphoma  Paroxysmal atrial fibrillation (HCC)  Physical deconditioning  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)   I have reviewed the medical record, interviewed the patient and family, and examined the patient. The following aspects are pertinent.  Past Medical History:  Diagnosis Date   (HFpEF) heart failure with preserved ejection fraction (HCC)    Anxiety    Aortic stenosis    Arthritis    Asthma    Chronic pain    CKD (chronic kidney disease) stage 3, GFR 30-59 ml/min (HCC)    Coronary atherosclerosis of native coronary artery    Multvessel, DES to LAD and RCA 8/03; EF 65% by echo 11/2014   DM2 (diabetes mellitus, type 2) (HCC)    Essential hypertension    Gastric ulcer    Gastritis    Related to NSAIDs   GERD (gastroesophageal reflux disease)    Iron  deficiency anemia    Mitral valve regurgitation    Mixed hyperlipidemia    Myocardial infarction (HCC)    Nephrolithiasis    Social History   Socioeconomic History   Marital status: Married    Spouse name: Not on file   Number of  children: Not on file   Years of education: Not on file   Highest education level: Not on file  Occupational History   Not on file  Tobacco Use   Smoking status: Former    Types: Cigarettes    Passive exposure: Never   Smokeless tobacco: Former    Types: Engineer, drilling   Vaping status: Never Used  Substance and Sexual Activity   Alcohol  use: No   Drug use: No   Sexual activity: Not Currently  Other Topics Concern   Not on file  Social History Narrative   Full time- Designer, fashion/clothing         Social Drivers of Health   Financial Resource Strain: Low Risk  (03/27/2019)  Received from [facility]   Overall Financial Resource Strain (CARDIA)    Difficulty of Paying Living Expenses: Not hard at all  Food Insecurity: No Food Insecurity (11/10/2023)   Hunger Vital Sign    Worried About Running Out of Food in the Last Year: Never true    Ran Out of Food in the Last Year: Never true  Transportation Needs: No Transportation Needs (11/10/2023)   PRAPARE - Administrator, Civil Service (Medical): No    Lack of Transportation (Non-Medical): No  Physical Activity: Inactive (03/27/2019)   Received from [facility]   Exercise Vital Sign    On average, how many days per week do you engage in moderate to strenuous exercise (like a brisk walk)?: 0 days    On average, how many minutes do you engage in exercise at this level?: 0 min  Stress: No Stress Concern Present (03/27/2019)   Received from [facility] - Occupational Stress Questionnaire    Feeling of Stress : Not at all  Social Connections: Socially Isolated (03/27/2019)   Received from [facility]   Social Connection and Isolation Panel    In a typical week, how many times do you talk on the phone with family, friends, or neighbors?: Twice a week    How often do you get together with friends or relatives?: Never    How often do you attend church or religious services?:  Never    Do you belong to any clubs or organizations such as church groups, unions, fraternal or athletic groups, or school groups?: No    How often do you attend meetings of the clubs or organizations you belong to?: Never    Are you married, widowed, divorced, separated, never married, or living with a partner?: Married   Family History  Problem Relation Age of Onset   Diabetes Mother    Aneurysm Mother        Brain   Stroke Father    Stroke Other    Colon cancer Neg Hx    Scheduled Meds:  aspirin  EC  81 mg Oral Q breakfast   Chlorhexidine  Gluconate Cloth  6 each Topical Q0600   diclofenac  Sodium  2 g Topical QID   insulin  aspart  0-5 Units Subcutaneous QHS   insulin  aspart  0-9 Units Subcutaneous TID WC   metolazone   2.5 mg Oral Once per day on Monday Thursday   midodrine   5 mg Oral TID WC   potassium chloride   40 mEq Oral BID   rOPINIRole   0.25 mg Oral QHS   rosuvastatin   10 mg Oral Daily   tamsulosin   0.4 mg Oral Daily   torsemide   20 mg Oral Daily   Continuous Infusions:  albumin  human 50 g (11/11/23 0515)   cefTRIAXone  (ROCEPHIN )  IV 2 g (11/11/23 0924)   [START ON 11/12/2023] vancomycin      PRN Meds:.acetaminophen  **OR** acetaminophen , LORazepam , oxyCODONE , prochlorperazine  Medications Prior to Admission:  Prior to Admission medications   Medication Sig Start Date End Date Taking? Authorizing Provider  acetaminophen  (TYLENOL ) 500 MG tablet Take 1,000 mg by mouth every 6 (six) hours as needed for moderate pain.   Yes [provider]  aspirin  EC 81 MG tablet Take 1 tablet (81 mg total) by mouth daily with breakfast. Swallow whole. 01/12/22  Yes Emokpae, Courage, MD  Calcium  Polycarbophil (FIBER-LAX PO) Take 1 capsule by mouth daily.   Yes [provider]  glipiZIDE  (GLUCOTROL  XL) 10 MG 24 hr tablet Take 1 tablet (10 mg total) by mouth daily with breakfast. Patient taking differently: Take 10 mg by mouth 2 (two) times daily. 02/04/22  Yes Gonfa, Taye T, MD   insulin  aspart (NOVOLOG ) 100 UNIT/ML injection Inject 8 Units into the skin at bedtime.   Yes [provider]  LANTUS  SOLOSTAR 100 UNIT/ML Solostar Pen Inject 12 Units into the skin daily. 10/23/23  Yes [provider]  levofloxacin (LEVAQUIN) 500 MG tablet Take 500 mg by mouth daily. 11/07/23  Yes [provider]  LORazepam  (ATIVAN ) 0.5 MG tablet Take 1 tablet (0.5 mg total) by mouth at bedtime as needed for anxiety or sleep. 09/10/22  Yes Bryn Bernardino NOVAK, MD  metolazone  (ZAROXOLYN ) 5 MG tablet Take 2.5 mg by mouth 2 (two) times a week. 05/20/22  Yes [provider]  midodrine  (PROAMATINE ) 5 MG tablet TAKE 1 TABLET BY MOUTH THREE TIMES DAILY WITH MEALS. Patient taking differently: Take 5 mg by mouth 2 (two) times daily with a meal. 09/01/23  Yes Miriam Norris, NP  nitroGLYCERIN  (NITROSTAT ) 0.4 MG SL tablet DISSOLVE 1 TABLET UNDER THE TONGUE EVERY 5 MINUTES AS NEEDED FOR CHEST PAIN. DO NOT EXCEED A TOTAL OF 3 DOSES IN 15 MINUTES. 01/01/23  Yes Debera Jayson MATSU, MD  oxyCODONE  (OXY IR/ROXICODONE ) 5 MG immediate release tablet Take 0.5-1 tablets (2.5-5 mg total) by mouth every 6 (six) hours as needed for severe pain. 09/10/22  Yes Bryn Bernardino NOVAK, MD  polyethylene glycol (MIRALAX  / GLYCOLAX ) 17 g packet Take 17 g by mouth 2 (two) times daily as needed for mild constipation. 02/04/22  Yes Gonfa, Taye T, MD  potassium chloride  (KLOR-CON  M) 10 MEQ tablet Take 20 mEq by mouth daily as needed. 11/05/23  Yes [provider]  potassium chloride  (KLOR-CON ) 10 MEQ tablet Take 20 mEq by mouth daily as needed. Take with Metalozone 07/07/22  Yes [provider]  rOPINIRole  (REQUIP ) 0.25 MG tablet Take 0.25 mg by mouth at bedtime. 09/01/22  Yes [provider]  rosuvastatin  (CRESTOR ) 10 MG tablet 1 (10 mg tablet) every Wednesday 06/24/23  Yes Miriam Norris, NP  tamsulosin  (FLOMAX ) 0.4 MG CAPS capsule Take 1 capsule (0.4 mg total) by mouth daily. 05/06/23  Yes Nieves Cough, MD  torsemide  (DEMADEX ) 20 MG tablet Take 1 tablet (20 mg total) by mouth every other day. Patient taking differently: Take 20 mg by mouth daily. 11/08/23  Yes Strader, [place] M, PA-C  vitamin B-12 (CYANOCOBALAMIN ) 500 MCG tablet Take 500 mcg by mouth daily.   Yes [provider]  vitamin C  (ASCORBIC ACID ) 500 MG tablet Take 500 mg by mouth at bedtime.   Yes [provider]  Blood Pressure Monitoring (BLOOD PRESSURE CUFF) MISC 1 each by Does not apply route as directed. Dx: hypertension & heart failure 12/28/22   Debera Jayson MATSU, MD   Allergies  Allergen Reactions   Nsaids Other (See Comments)    On blood thinners with h/o IDA/bleeding   Prednisone Other (See Comments)    Makes him feel faint, hyperglycemic    Review of Systems  Unable to perform ROS: Age    Physical Exam Vitals and nursing note reviewed.  Constitutional:      General: He is not in acute distress.    Appearance: He is ill-appearing.  Cardiovascular:     Rate and Rhythm: Normal rate.  Skin:    General: Skin is warm and dry.  Comments: LLE red swollen  Neurological:     Mental Status: He is alert and oriented to person, place, and time.  Psychiatric:        Mood and Affect: Mood normal.        Behavior: Behavior normal.     Vital Signs: BP 111/80   Pulse 99   Temp 97.6 F (36.4 C) (Axillary)   Resp 18   Ht 6' (1.829 m)   Wt 106 kg   SpO2 95%   BMI 31.69 kg/m  Pain Scale: 0-10 POSS *See Group Information*: 1-Acceptable,Awake and alert Pain Score: 0-No pain   SpO2: SpO2: 95 % O2 Device:SpO2: 95 % O2 Flow Rate: .O2 Flow Rate (L/min): 2 L/min  IO: Intake/output summary:  Intake/Output Summary (Last 24 hours) at 11/11/2023 0929 Last data filed at 11/11/2023 9357 Gross per 24 hour  Intake 100 ml  Output 1800 ml  Net -1700 ml    LBM: Last BM Date : 11/09/23 (per patient) Baseline Weight: Weight: 97.5 kg Most recent weight: Weight: 106 kg     Palliative  Assessment/Data:     Time In: 0830  Time Out: 0915 Time Total: 75 minutes  Greater than 50%  of this time was spent counseling and coordinating care related to the above assessment and plan.  Signed by: Lorenza DELENA Birkenhead, NP   Please contact Palliative Medicine Team phone at [phone] for questions and concerns.  For individual provider: See Tracey

## 2023-11-11 NOTE — Plan of Care (Signed)
   Problem: Nutrition: Goal: Adequate nutrition will be maintained Outcome: Progressing   Problem: Pain Managment: Goal: General experience of comfort will improve and/or be controlled Outcome: Progressing   Problem: Safety: Goal: Ability to remain free from injury will improve Outcome: Progressing

## 2023-11-11 NOTE — Progress Notes (Signed)
 PROGRESS NOTE    Bradley Sheppard  FMW:[id number] DOB: Nov 25, 1940 DOA: 11/10/2023 PCP: Rosamond Leta NOVAK, MD   Brief Narrative:    Bradley Sheppard is a 83 y.o. 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  Patient has had some fever and tachycardia and was started on Levaquin, but not improving.  He has apparently been taking Levaquin over the last 4 days with no improvement and he was seen by nephrology office today with recommendations to be hospitalized for inpatient management.  Patient was admitted for management of left lower extremity cellulitis and is also noted to have AKI on CKD stage IIIb.  AKI slowly improving, but now noted to have worsening hemoglobin levels and stool occult pending.  Noted to have prior history of GI bleed.  Anticipate discharge once cellulitis further improves and creatinine level stabilized along with hemoglobin levels.  Assessment & Plan:   Principal Problem:   Cellulitis Active Problems:   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)   Essential hypertension   MALT lymphoma   Chronic kidney disease, stage 3b (HCC)   Paroxysmal atrial fibrillation (HCC)   Physical deconditioning   DNR (do not resuscitate)   History of CAD (coronary artery disease)  Assessment and Plan:   Left lower extremity cellulitis in the setting of venous insufficiency - Blood cultures ordered and pending - MRSA negative, discontinue vancomycin  -Blood cultures with no growth thus far - Continue on vancomycin  and Rocephin  for now - Leg elevation and TED hoses - Continue home oral torsemide  dosing - ABI unremarkable - Left lower extremity without DVT   AKI on CKD stage IIIb-improving - Follows with Dr. Rachele nephrology outpatient - Appears to have some  anasarca with low albumin  - Likely in the setting of some hypotension as well, plan to give some albumin  for now and monitor blood pressure readings - Elevate legs - Monitor carefully with home oral torsemide  dosing - Monitor strict I's and O's and consider nephrology evaluation by a.m.  Worsening anemia -Evaluate with stool occult -Heparin  to SCDs -Check anemia panel -Recheck hemoglobin and hematocrit -Consider transfusion as needed for hemoglobin less than 7   Chronic HFpEF -No symptoms of dyspnea or orthopnea noted at this time, but volume overloaded -Chest x-ray with stable cardiomegaly - Echo on 09/2022 with EF 55-60% - Plan to continue home diuretics with use of albumin   Hypokalemia -Replete and reevaluate in a.m.   Type 2 diabetes - SSI and carb modified diet   PAF with RVR - Not on anticoagulation due to rectal bleeding and weakness predisposing to falls - Follows with Dr. 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  Consultants:  None  Procedures:  None  Antimicrobials:  Anti-infectives (From admission, onward)    Start     Dose/Rate Route Frequency Ordered Stop   11/12/23 1600  vancomycin  (VANCOREADY) IVPB 1500 mg/300 mL        1,500 mg 150 mL/hr over 120 Minutes Intravenous Every 48 hours 11/10/23 1551     11/11/23 1000  cefTRIAXone  (ROCEPHIN )  2 g in sodium chloride  0.9 % 100 mL IVPB        2 g 200 mL/hr over 30 Minutes Intravenous Every 24 hours 11/10/23 1535     11/10/23 1600  vancomycin  (VANCOREADY) IVPB 2000 mg/400 mL        2,000 mg 200 mL/hr over 120 Minutes Intravenous  Once 11/10/23 1548 11/10/23 1848   11/10/23 1330  cefTRIAXone  (ROCEPHIN ) 2 g in sodium chloride  0.9 % 100 mL IVPB        2 g 200 mL/hr over 30 Minutes Intravenous  Once 11/10/23 1323 11/10/23 1427      Subjective: Patient seen and evaluated today with no new acute complaints or concerns. No acute concerns or events noted overnight.  Erythema appears improved.  Objective: Vitals:   11/11/23 1258 11/11/23 1300 11/11/23 1302 11/11/23 1303  BP:      Pulse: 96 93 100   Resp: 18 14 17    Temp:      TempSrc:      SpO2: 92% 91% 93% 96%  Weight:      Height:        Intake/Output Summary (Last 24 hours) at 11/11/2023 1427 Last data filed at 11/11/2023 1156 Gross per 24 hour  Intake --  Output 2700 ml  Net -2700 ml   Filed Weights   11/10/23 1215 11/10/23 1600  Weight: 97.5 kg 106 kg    Examination:  General exam: Appears calm and comfortable  Respiratory system: Clear to auscultation. Respiratory effort normal. Cardiovascular system: S1 & S2 heard, irregular and mildly tachycardic Gastrointestinal system: Abdomen is soft Central nervous system: Alert and awake Extremities: No edema Skin:   Psychiatry: Flat affect.    Data Reviewed: I have personally reviewed following labs and imaging studies  CBC: Recent Labs  Lab 11/10/23 1238 11/11/23 0424 11/11/23 0919  WBC 8.3 7.0  --   NEUTROABS 5.6  --   --   HGB 12.6* 9.9* 10.0*  HCT 37.9* 31.1* 31.0*  MCV 93.3 93.4  --   PLT 270 203  --    Basic Metabolic Panel: Recent Labs  Lab 11/10/23 1238 11/11/23 0424  NA 137 139  K 3.9 3.2*  CL 100 103  CO2 27 26  GLUCOSE 188* 68*  BUN 76* 74*  CREATININE 2.70* 2.53*  CALCIUM  8.8* 8.6*  MG  --  2.0   GFR: Estimated Creatinine  Clearance: 26.9 mL/min (A) (by C-G formula based on SCr of 2.53 mg/dL (H)). Liver Function Tests: Recent Labs  Lab 11/10/23 1238 11/11/23 0424  AST 20 16  ALT 15 10  ALKPHOS 102 68  BILITOT 0.5 0.7  PROT 9.2* 7.6  ALBUMIN  2.9* 3.5   No results for input(s): LIPASE, AMYLASE in the last 168 hours. No results for input(s): AMMONIA in the last 168 hours. Coagulation Profile: Recent Labs  Lab 11/10/23 1238  INR 1.1   Cardiac Enzymes: No results for input(s): CKTOTAL, CKMB, CKMBINDEX, TROPONINI in the last 168 hours. BNP (last 3 results) No results for input(s): PROBNP in the last 8760 hours. HbA1C: No results for input(s):  HGBA1C in the last 72 hours. CBG: Recent Labs  Lab 11/10/23 1626 11/10/23 2045 11/11/23 0727 11/11/23 0810 11/11/23 1142  GLUCAP 137* 211* 69* 104* 165*   Lipid Profile: No results for input(s): CHOL, HDL, LDLCALC, TRIG, CHOLHDL, LDLDIRECT in the last 72 hours. Thyroid  Function Tests: No results for input(s): TSH, T4TOTAL, FREET4, T3FREE, THYROIDAB in the last 72 hours. Anemia Panel: Recent Labs    11/11/23 0424  VITAMINB12 532  FOLATE 14.4  FERRITIN 134  TIBC 147*  IRON  39*  RETICCTPCT 1.1   Sepsis Labs: Recent Labs  Lab 11/10/23 1238  LATICACIDVEN 1.7    Recent Results (from the past 240 hours)  Culture, blood (Routine x 2)     Status: None (Preliminary result)   Collection Time: 11/10/23 12:38 PM   Specimen: BLOOD LEFT ARM  Result Value Ref Range Status   Specimen Description BLOOD LEFT ARM  Final   Special Requests   Final    BOTTLES DRAWN AEROBIC AND ANAEROBIC Blood Culture adequate volume   Culture   Final    NO GROWTH < 24 HOURS Performed at [facility], [address], [place], [place] [address]    Report Status PENDING  Incomplete  Culture, blood (Routine x 2)     Status: None (Preliminary result)   Collection Time: 11/10/23 12:38 PM   Specimen: BLOOD RIGHT HAND  Result Value Ref  Range Status   Specimen Description BLOOD RIGHT HAND  Final   Special Requests   Final    BOTTLES DRAWN AEROBIC AND ANAEROBIC Blood Culture adequate volume   Culture   Final    NO GROWTH < 24 HOURS Performed at [facility], [address], [place], [place] [address]    Report Status PENDING  Incomplete  MRSA Next Gen by PCR, Nasal     Status: None   Collection Time: 11/10/23  4:00 PM   Specimen: Nasal Mucosa; Nasal Swab  Result Value Ref Range Status   MRSA by PCR Next Gen NOT DETECTED NOT DETECTED Final    Comment: (NOTE) The GeneXpert MRSA Assay (FDA approved for NASAL specimens only), is one component of a comprehensive MRSA colonization surveillance program. It is not intended to diagnose MRSA infection nor to guide or monitor treatment for MRSA infections. Test performance is not FDA approved in patients less than 10 years old. Performed at [facility], [address] [place], [place] [address]          Radiology Studies: US  ARTERIAL ABI (SCREENING LOWER EXTREMITY) Result Date: 11/11/2023 CLINICAL DATA:  BILATERAL lower extremity swelling. Hypertension. Hyperlipidemia. Diabetes. EXAM: NONINVASIVE PHYSIOLOGIC VASCULAR STUDY OF BILATERAL LOWER EXTREMITIES TECHNIQUE: Evaluation of both lower extremities were performed at rest, including calculation of ankle-brachial indices with single level pressure measurements and doppler recording. COMPARISON:  None available. FINDINGS: Right ABI:  1.31 Left ABI:  1.26 Right Lower Extremity: Posterior tibial and dorsalis pedis artery waveforms are monophasic. Left Lower Extremity: Posterior tibial and dorsalis pedis artery waveforms are monophasic. 1.0-1.4 Normal IMPRESSION: Ankle brachial indices are within normal limits bilaterally. Monophasic waveforms at the ankle are likely artifact given to swelling. If there is continued clinical suspicion for peripheral arterial disease, further evaluation with CT angiography of the abdominal aorta with  lower extremity runoff should be performed. Electronically Signed   By: Aliene Lloyd M.D.   On: 11/11/2023 11:07   US  Venous Img Lower Unilateral Left Result Date: 11/10/2023 CLINICAL DATA:  Left lower extremity swelling EXAM: LEFT LOWER EXTREMITY VENOUS  DOPPLER ULTRASOUND TECHNIQUE: Gray-scale sonography with compression, as well as color and duplex ultrasound, were performed to evaluate the deep venous system(s) from the level of the common femoral vein through the popliteal and proximal calf veins. COMPARISON:  None Available. FINDINGS: VENOUS Normal compressibility of the common femoral, superficial femoral, and popliteal veins, as well as the visualized calf veins. Visualized portions of profunda femoral vein and great saphenous vein unremarkable. No filling defects to suggest DVT on grayscale or color Doppler imaging. Doppler waveforms show normal direction of venous flow, normal respiratory plasticity and response to augmentation. Limited views of the contralateral common femoral vein are unremarkable. OTHER Edema is present within the superficial soft tissues. Limitations: none IMPRESSION: 1. No evidence of left lower extremity DVT. Electronically Signed   By: Maude Naegeli M.D.   On: 11/10/2023 14:35   DG Chest 2 View Result Date: 11/10/2023 CLINICAL DATA:  Concern for sepsis. EXAM: CHEST - 2 VIEW COMPARISON:  09/04/2022. FINDINGS: Stable cardiomegaly. Aortic atherosclerosis. 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 No focal consolidation, pleural effusion, or pneumothorax. Diffuse osseous demineralization. 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 IMPRESSION: 1. Stable cardiomegaly.  No acute cardiopulmonary findings. 2. 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 Electronically Signed   By: Harrietta Sherry M.D.   On:  11/10/2023 12:57        Scheduled Meds:  aspirin  EC  81 mg Oral Q breakfast   Chlorhexidine  Gluconate Cloth  6 each Topical Q0600   diclofenac  Sodium  2 g Topical QID   insulin  aspart  0-5 Units Subcutaneous QHS   insulin  aspart  0-9 Units Subcutaneous TID WC   metolazone   2.5 mg Oral Once per day on Monday Thursday   midodrine   5 mg Oral TID WC   polyethylene glycol  17 g Oral Daily   potassium chloride   40 mEq Oral BID   rOPINIRole   0.25 mg Oral QHS   rosuvastatin   10 mg Oral Daily   tamsulosin   0.4 mg Oral Daily   torsemide   20 mg Oral Daily   Continuous Infusions:  cefTRIAXone  (ROCEPHIN )  IV 2 g (11/11/23 0924)   [START ON 11/12/2023] vancomycin        LOS: 0 days    Time spent: 55 minutes    Jameriah Trotti D Maree, DO [facility]  If 7PM-7AM, please contact night-coverage [email or web address] 11/11/2023, 2:27 PM

## 2023-11-11 NOTE — TOC Progression Note (Signed)
 Transition of Care [facility]) - Progression Note    Patient Details  Name: Bradley Sheppard MRN: [id number] Date of Birth: 08-Sep-1940  [facility] CM/SW Contact  Sharlyne Stabs, RN Phone Number: 11/11/2023, 2:03 PM  Clinical Narrative:   Patient lives at home with his wife. Palliative consult completed. Patient wishes to return home. [facility] home health services set up last week. MD aware to place resumption orders.     Expected Discharge Plan: Home w Home Health Services Barriers to Discharge: Continued Medical Work up  Expected Discharge Plan and Services     Post Acute Care Choice: Home Health Living arrangements for the past 2 months: Single Family Home                       Social Determinants of Health (SDOH) Interventions SDOH Screenings   Food Insecurity: No Food Insecurity (11/10/2023)  Housing: Low Risk  (11/10/2023)  Transportation Needs: No Transportation Needs (11/10/2023)  Utilities: Not At Risk (11/10/2023)  Financial Resource Strain: Low Risk  (03/27/2019)   Received from [facility]  Physical Activity: Inactive (03/27/2019)   Received from [facility]  Social Connections: Moderately Isolated (11/10/2023)  Stress: No Stress Concern Present (03/27/2019)   Received from [facility]  Tobacco Use: Medium Risk (11/11/2023)  Health Literacy: Medium Risk (08/12/2020)   Received from [facility]    Readmission Risk Interventions    09/06/2022   10:32 AM 09/05/2022    5:41 PM 02/04/2022    9:13 AM  Readmission Risk Prevention Plan  Transportation Screening Complete Complete Complete  Medication Review (RN Care Manager) Complete Complete Complete  PCP or Specialist appointment within 3-5 days of discharge Complete Complete Complete  HRI or Home Care Consult Complete Complete Complete  SW Recovery Care/Counseling Consult  Complete Complete  Palliative Care Screening   Not Applicable  Skilled Nursing Facility   Complete

## 2023-11-11 NOTE — Care Management Obs Status (Signed)
 MEDICARE OBSERVATION STATUS NOTIFICATION   Patient Details  Name: Bradley Sheppard MRN: [id number] Date of Birth: 01/26/1941   Medicare Observation Status Notification Given:  Yes    Sharlyne Stabs, RN 11/11/2023, 12:05 PM

## 2023-11-11 NOTE — Plan of Care (Signed)

## 2023-11-11 NOTE — Progress Notes (Signed)
 Pt c/o LLE wrap being too tight and request to have unwrapped. Coban wrap was take off and rest wrap loosen up, Patient stated it was more comfortable. Extremity elevated on Pillow.

## 2023-11-11 NOTE — Progress Notes (Signed)
 Hypoglycemic Event  CBG: 69  Treatment: 4 oz juice/soda  Symptoms: None  Follow-up CBG: Time: 0810 CBG Result: 104  Possible Reasons for Event: Change in activity  Comments/MD notified:Dr Maree Delon LITTIE Myrna

## 2023-11-12 DIAGNOSIS — J9601 Acute respiratory failure with hypoxia: Secondary | ICD-10-CM

## 2023-11-12 DIAGNOSIS — N189 Chronic kidney disease, unspecified: Secondary | ICD-10-CM

## 2023-11-12 DIAGNOSIS — N179 Acute kidney failure, unspecified: Secondary | ICD-10-CM

## 2023-11-12 DIAGNOSIS — I509 Heart failure, unspecified: Secondary | ICD-10-CM | POA: Diagnosis not present

## 2023-11-12 DIAGNOSIS — L03116 Cellulitis of left lower limb: Secondary | ICD-10-CM | POA: Diagnosis not present

## 2023-11-12 LAB — BASIC METABOLIC PANEL WITH GFR
Anion gap: 13 (ref 5–15)
BUN: 73 mg/dL — ABNORMAL HIGH (ref 8–23)
CO2: 25 mmol/L (ref 22–32)
Calcium: 9.2 mg/dL (ref 8.9–10.3)
Chloride: 99 mmol/L (ref 98–111)
Creatinine, Ser: 2.57 mg/dL — ABNORMAL HIGH (ref 0.61–1.24)
GFR, Estimated: 24 mL/min — ABNORMAL LOW (ref >60)
Glucose, Bld: 116 mg/dL — ABNORMAL HIGH (ref 70–99)
Potassium: 4 mmol/L (ref 3.5–5.1)
Sodium: 137 mmol/L (ref 135–145)

## 2023-11-12 LAB — IRON AND TIBC
Iron: 19 ug/dL — ABNORMAL LOW (ref 45–182)
Saturation Ratios: 14 % — ABNORMAL LOW (ref 17.9–39.5)
TIBC: 139 ug/dL — ABNORMAL LOW (ref 250–450)
UIBC: 120 ug/dL

## 2023-11-12 LAB — GLUCOSE, CAPILLARY
Glucose-Capillary: 125 mg/dL — ABNORMAL HIGH (ref 70–99)
Glucose-Capillary: 189 mg/dL — ABNORMAL HIGH (ref 70–99)
Glucose-Capillary: 176 mg/dL — ABNORMAL HIGH (ref 70–99)
Glucose-Capillary: 288 mg/dL — ABNORMAL HIGH (ref 70–99)

## 2023-11-12 LAB — CREATININE, SERUM
Creatinine, Ser: 2.55 mg/dL — ABNORMAL HIGH (ref 0.61–1.24)
GFR, Estimated: 24 mL/min — ABNORMAL LOW (ref >60)

## 2023-11-12 MED ORDER — SENNOSIDES-DOCUSATE SODIUM 8.6-50 MG PO TABS
1.0000 | ORAL_TABLET | Freq: Two times a day (BID) | ORAL | Status: DC
  Administered 2023-11-12 – 2023-11-13 (×3): 1 via ORAL
  Filled 2023-11-12 (×3): qty 1

## 2023-11-12 MED ORDER — CLINDAMYCIN HCL 300 MG PO CAPS: 300.0000 mg | ORAL_CAPSULE | Freq: Three times a day (TID) | ORAL | 0 refills | Status: DC

## 2023-11-12 MED ORDER — MAGNESIUM CITRATE PO SOLN
1.0000 | Freq: Once | ORAL | Status: AC
  Administered 2023-11-12: 1 via ORAL
  Filled 2023-11-12: qty 296

## 2023-11-12 MED ORDER — LACTINEX PO CHEW
1.0000 | CHEWABLE_TABLET | Freq: Three times a day (TID) | ORAL | 0 refills | Status: AC
Start: 2023-11-12 — End: 2023-11-22

## 2023-11-12 NOTE — Progress Notes (Signed)
 PROGRESS NOTE    Bradley Sheppard  FMW:[id number]  DOB: 08-13-1940  DOA: 11/10/2023  PCP: Rosamond Leta NOVAK, MD   Brief Narrative:    Bradley Sheppard is a 83 y.o. 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  Patient has had some fever and tachycardia and was started on Levaquin, but not improving.  He has apparently been taking Levaquin over the last 4 days with no improvement and he was seen by nephrology office today with recommendations to be hospitalized for inpatient management.  Patient was admitted for management of left lower extremity cellulitis and is also noted to have AKI on CKD stage IIIb.  AKI slowly improving, but now noted to have worsening hemoglobin levels and stool occult pending.  Noted to have prior history of GI bleed.  Anticipate discharge once cellulitis further improves and creatinine level stabilized along with hemoglobin levels.  Assessment & Plan:   Principal Problem:   Cellulitis Active Problems:   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)   Essential hypertension   MALT lymphoma   Chronic kidney disease, stage 3b (HCC)   Paroxysmal atrial fibrillation (HCC)   Physical deconditioning   DNR (do not resuscitate)   History of CAD (coronary artery disease)  Assessment and Plan:   Acute respiratory failure - Multifactorial, mild volume overload CHF exacerbation - Requiring 2 L of oxygen, to maintain O2 sat greater than 92% - Monitoring closely - Likely will need supplemental oxygen for home use   Left lower extremity cellulitis in the setting of venous insufficiency - Blood cultures ordered and pending - MRSA negative, discontinue vancomycin  -Blood cultures with no growth thus far - Continue on vancomycin  and Rocephin  for now - Leg  elevation and TED hoses - Continue home oral torsemide  dosing - ABI unremarkable - Left lower extremity without DVT   AKI on CKD stage IIIb-improving - Follows with Dr. Rachele nephrology outpatient - Appears to have some anasarca with low albumin  - Likely in the setting of some hypotension as well, plan to give some albumin  for now and monitor blood pressure readings - Elevate legs - Monitor carefully with home oral torsemide  dosing - Monitor strict I's and O's and consider nephrology evaluation by a.m.  Worsening anemia -Evaluate with stool occult -Heparin  to SCDs -Check anemia panel -Recheck hemoglobin and hematocrit -Consider transfusion as needed for hemoglobin less than 7   Chronic HFpEF -No symptoms of dyspnea or orthopnea noted at this time, but volume overloaded -Chest x-ray with stable cardiomegaly - Echo on 09/2022 with EF 55-60% - Plan to continue home diuretics with use of albumin   Hypokalemia -Replete and reevaluate in a.m.   Type 2 diabetes - SSI and carb modified diet   PAF with RVR - Not on anticoagulation due to rectal bleeding and weakness predisposing to falls - Follows with Dr. 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  The patient and family to make decision regarding disposition when stable to either be discharged home with home health  and palliative versus SNF  Total family meeting 30 minutes    DVT prophylaxis: Heparin  to SCDs Code Status: DNR Family Communication: None at bedside Disposition Plan:  Status is: Observation The patient will require care spanning > 2 midnights and should be moved to inpatient because: Need for IV medications.   Consultants:  None  Procedures:  None  Antimicrobials:  Anti-infectives (From admission, onward)    Start     Dose/Rate Route Frequency Ordered Stop   11/12/23 1600  vancomycin  (VANCOREADY) IVPB 1500 mg/300 mL        1,500 mg 150 mL/hr over 120 Minutes Intravenous Every 48 hours 11/10/23 1551     11/12/23 0000  clindamycin  (CLEOCIN ) 300 MG capsule        300 mg Oral 3 times daily 11/12/23 1134 11/16/23 2359   11/11/23 1000  cefTRIAXone  (ROCEPHIN ) 2 g in sodium chloride  0.9 % 100 mL IVPB        2 g 200 mL/hr over 30 Minutes Intravenous Every 24 hours 11/10/23 1535     11/10/23 1600  vancomycin  (VANCOREADY) IVPB 2000 mg/400 mL        2,000 mg 200 mL/hr over 120 Minutes Intravenous  Once 11/10/23 1548 11/10/23 1848   11/10/23 1330  cefTRIAXone  (ROCEPHIN ) 2 g in sodium chloride  0.9 % 100 mL IVPB        2 g 200 mL/hr over 30 Minutes Intravenous  Once 11/10/23 1323 11/10/23 1427      Subjective: Patient seen and evaluated today with no new acute complaints or concerns. No acute concerns or events noted overnight.  Erythema appears improved.  Objective: Vitals:   11/12/23 1100 11/12/23 1123 11/12/23 1300 11/12/23 1400  BP: 107/61  128/74 (!) 114/53  Pulse: 87  (!) 118 89  Resp: 15  18 14   Temp:  98.1 F (36.7 C)    TempSrc:  Axillary    SpO2: 98%  95% 96%  Weight:      Height:        Intake/Output Summary (Last 24 hours) at 11/12/2023 1501 Last data filed at 11/12/2023 1323 Gross per 24 hour  Intake 584.34 ml  Output 800 ml  Net -215.66 ml   Filed Weights   11/10/23 1215 11/10/23 1600  Weight: 97.5 kg 106 kg    Examination:    General:  AAO x 3,   cooperative, no distress;   HEENT:  Normocephalic, PERRL, otherwise with in Normal limits   Neuro:  CNII-XII intact. , normal motor and sensation, reflexes intact   Lungs:   Clear to auscultation BL, Respirations unlabored,  No wheezes / crackles  Cardio:    S1/S2, RRR, No murmure, No Rubs or Gallops   Abdomen:  Soft, non-tender, bowel sounds active all four quadrants, no guarding or peritoneal signs.  Muscular  skeletal:  Limited exam -global generalized weaknesses - in bed, able to move all 4 extremities,   2+ pulses,  symmetric, No pitting edema  Skin:  Dry, warm to touch,   Wounds: Please see nursing documentation       Psychiatry: Flat affect.    Data Reviewed: I have personally reviewed following labs and imaging studies  CBC: Recent Labs  Lab 11/10/23 1238 11/11/23 0424 11/11/23 0919  WBC 8.3 7.0  --   NEUTROABS 5.6  --   --   HGB 12.6* 9.9* 10.0*  HCT 37.9* 31.1* 31.0*  MCV 93.3 93.4  --   PLT 270 203  --    Basic Metabolic Panel: Recent Labs  Lab 11/10/23 1238 11/11/23 0424 11/12/23 0418  NA 137 139 137  K 3.9 3.2* 4.0  CL 100 103 99  CO2 27 26 25   GLUCOSE 188* 68* 116*  BUN 76* 74* 73*  CREATININE 2.70* 2.53* 2.57*  2.55*  CALCIUM  8.8* 8.6* 9.2  MG  --  2.0  --    GFR: Estimated Creatinine Clearance: 26.7 mL/min (A) (by C-G formula based on SCr of 2.55 mg/dL (H)). Liver Function Tests: Recent Labs  Lab 11/10/23 1238 11/11/23 0424  AST 20 16  ALT 15 10  ALKPHOS 102 68  BILITOT 0.5 0.7  PROT 9.2* 7.6  ALBUMIN  2.9* 3.5   No results for input(s): LIPASE, AMYLASE in the last 168 hours. No results for input(s): AMMONIA in the last 168 hours. Coagulation Profile: Recent Labs  Lab 11/10/23 1238  INR 1.1    HbA1C: Recent Labs    11/10/23 1238  HGBA1C 8.4*   CBG: Recent Labs  Lab 11/11/23 1142 11/11/23 1629 11/11/23 2125 11/12/23 0730 11/12/23 1125  GLUCAP 165* 176* 241* 125* 189*    Anemia Panel: Recent Labs     11/11/23 0424 11/12/23 0900  VITAMINB12 532  --   FOLATE 14.4  --   FERRITIN 134  --   TIBC 147* 139*  IRON  39* 19*  RETICCTPCT 1.1  --    Sepsis Labs: Recent Labs  Lab 11/10/23 1238  LATICACIDVEN 1.7    Recent Results (from the past 240 hours)  Culture, blood (Routine x 2)     Status: None (Preliminary result)   Collection Time: 11/10/23 12:38 PM   Specimen: BLOOD LEFT ARM  Result Value Ref Range Status   Specimen Description BLOOD LEFT ARM  Final   Special Requests   Final    BOTTLES DRAWN AEROBIC AND ANAEROBIC Blood Culture adequate volume   Culture   Final    NO GROWTH 2 DAYS Performed at [facility], [address] [place], [place] [address]    Report Status PENDING  Incomplete  Culture, blood (Routine x 2)     Status: None (Preliminary result)   Collection Time: 11/10/23 12:38 PM   Specimen: BLOOD RIGHT HAND  Result Value Ref Range Status   Specimen Description BLOOD RIGHT HAND  Final   Special Requests   Final    BOTTLES DRAWN AEROBIC AND ANAEROBIC Blood Culture adequate volume   Culture   Final    NO GROWTH 2 DAYS Performed at [facility], [address], [place], [place] [address]    Report Status PENDING  Incomplete  MRSA Next Gen by PCR, Nasal     Status: None   Collection Time: 11/10/23  4:00 PM   Specimen: Nasal Mucosa; Nasal Swab  Result Value Ref Range Status   MRSA by PCR Next Gen NOT DETECTED NOT DETECTED Final    Comment: (NOTE) The GeneXpert MRSA Assay (FDA approved for NASAL specimens only), is one component of a comprehensive MRSA colonization surveillance program. It is not intended to diagnose MRSA infection nor to guide or monitor treatment for MRSA infections. Test performance is not FDA approved in patients less than 4 years old.  Performed at [facility], [address] [place], [place] [address]          Radiology Studies: US  ARTERIAL ABI (SCREENING LOWER EXTREMITY) Result Date: 11/11/2023 CLINICAL DATA:  BILATERAL lower  extremity swelling. Hypertension. Hyperlipidemia. Diabetes. EXAM: NONINVASIVE PHYSIOLOGIC VASCULAR STUDY OF BILATERAL LOWER EXTREMITIES TECHNIQUE: Evaluation of both lower extremities were performed at rest, including calculation of ankle-brachial indices with single level pressure measurements and doppler recording. COMPARISON:  None available. FINDINGS: Right ABI:  1.31 Left ABI:  1.26 Right Lower Extremity: Posterior tibial and dorsalis pedis artery waveforms are monophasic. Left Lower Extremity: Posterior tibial and dorsalis pedis artery waveforms are monophasic. 1.0-1.4 Normal IMPRESSION: Ankle brachial indices are within normal limits bilaterally. Monophasic waveforms at the ankle are likely artifact given to swelling. If there is continued clinical suspicion for peripheral arterial disease, further evaluation with CT angiography of the abdominal aorta with lower extremity runoff should be performed. Electronically Signed   By: Aliene Lloyd M.D.   On: 11/11/2023 11:07        Scheduled Meds:  aspirin  EC  81 mg Oral Q breakfast   Chlorhexidine  Gluconate Cloth  6 each Topical Q0600   diclofenac  Sodium  2 g Topical QID   insulin  aspart  0-5 Units Subcutaneous QHS   insulin  aspart  0-9 Units Subcutaneous TID WC   metolazone   2.5 mg Oral Once per day on Monday Thursday   midodrine   5 mg Oral TID WC   polyethylene glycol  17 g Oral Daily   rOPINIRole   0.25 mg Oral QHS   rosuvastatin   10 mg Oral Daily   senna-docusate  1 tablet Oral BID   tamsulosin   0.4 mg Oral Daily   torsemide   20 mg Oral Daily   Continuous Infusions:  cefTRIAXone  (ROCEPHIN )  IV 2 g (11/12/23 0937)   vancomycin        LOS: 1 day    Time spent: 55 minutes    SIGNED: Adriana DELENA Grams, MD, FHM. FAAFP [facility],  Pager (please use [email or web address] to page/text)  Please use Epic Secure Chat for non-urgent communication (7AM-7PM) If 7PM-7AM, please contact night-coverage [email or web address],  11/12/2023, 3:02 PM

## 2023-11-12 NOTE — Progress Notes (Signed)
 SATURATION QUALIFICATIONS: (This note is used to comply with regulatory documentation for home oxygen)  Patient Saturations on Room Air at Rest = 83%  Patient Saturations on 2L while at rest = 95%   Please briefly explain why patient needs home oxygen: Patient desats on room air

## 2023-11-12 NOTE — Plan of Care (Signed)

## 2023-11-12 NOTE — TOC CM/SW Note (Addendum)
 Hospital Bed  Patient requires frequent re-positioning of the body in ways that cannot be achieved with an ordinary bed or wedge pillow, to eliminate pain, reduce pressure, and the head of the bed to be elevated more than 30 degrees most of the time due to R53.81 Physical deconditioning and I50.33 Acute on chronic diastolic CHF.   Wheelchair  Patient suffers from R53.81 Physical deconditioning and  which impairs their ability to perform daily activities like ambulating in the home. A walker will not resolve issue with performing activities of daily living. A wheelchair will allow patient to safely perform daily activities. Patient can safely propel the wheelchair in the home or has a caregiver who can provide assistance.   3N1  The beneficiary is confined to one level of the home environment and there is no toilet on that level.

## 2023-11-12 NOTE — Discharge Summary (Addendum)
 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  Patient has had some fever and tachycardia and was started on Levaquin, but not improving.  He has apparently been taking Levaquin over the last 4 days with no improvement and he was seen by nephrology office today with recommendations to be hospitalized for inpatient management.  Patient was admitted for management of left lower extremity cellulitis and is also noted to have AKI on CKD stage IIIb.  AKI slowly improving, but now noted to have worsening hemoglobin levels and stool occult pending.  Noted to have prior history of GI bleed.  Anticipate discharge once cellulitis further improves and creatinine level stabilized along with hemoglobin levels.     Left lower extremity cellulitis in the setting of venous insufficiency - Blood cultures ordered -no growth to date - MRSA negative, discontinue vancomycin  -  Continue on vancomycin  and Rocephin  >>> switching to p.o. clindamycin  - Leg elevation and TED hoses - Holding torsemide  dosing-due to elevated creatinine - ABI unremarkable - Left lower extremity without DVT   AKI on CKD stage IIIb-improving - Follows with Dr. Rachele nephrology outpatient - Appears to have some anasarca with low albumin  - Likely in the setting of some hypotension as well, plan to give some albumin  for now and monitor blood pressure readings - Elevate legs - Monitor carefully with home oral torsemide  dosing    Anemia of chronic disease -Mild drop in hemoglobin but stable    Latest Ref Rng & Units 11/11/2023    9:19 AM 11/11/2023    4:24 AM 11/10/2023   12:38 PM  CBC  WBC 4.0 - 10.5 K/uL  7.0  8.3   Hemoglobin 13.0 - 17.0 g/dL 89.9  9.9  87.3   Hematocrit 39.0 - 52.0 % 31.0  31.1  37.9   Platelets 150 - 400 K/uL  203  270   - No signs of bleeding -    Chronic HFpEF -No symptoms of dyspnea or orthopnea noted at this time, but volume overloaded -Chest x-ray with stable cardiomegaly - Echo on 09/2022 with EF 55-60% - Plan to continue home diuretics with use of albumin    Hypokalemia -Replete and reevaluate in a.m.   Type 2 diabetes - SSI and carb modified diet   PAF with RVR - Not on anticoagulation due to rectal bleeding and weakness predisposing to falls - Follows with Dr. Debera outpatient - Coreg  held due to lower blood pressure readings and  takes midodrine  for blood pressure support - Patient not a candidate for DCCV   CAD - History of DES to LAD and RCA in 2003 - Continue aspirin , and rosuvastatin , no Coreg  due to hypotension   MALT lymphoma - Follows with Dr. 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     DVT prophylaxis: Heparin  to SCDs Code Status: DNR  Disposition: Home Diet recommendation:  Discharge Diet Orders (From admission, onward)     Start     Ordered   11/12/23  0000  Diet - low sodium heart healthy        11/12/23 1134           Cardiac diet DISCHARGE MEDICATION: Allergies as of 11/12/2023       Reactions   Nsaids Other (See Comments)   On blood thinners with h/o IDA/bleeding   Prednisone Other (See Comments)   Makes him feel faint, hyperglycemic         Medication List     PAUSE taking these medications    torsemide  20 MG tablet Wait to take this until: November 15, 2023 Commonly known as: DEMADEX  Take 1 tablet (20 mg total) by mouth every other day. What changed: when to take this       STOP taking these medications    levofloxacin 500 MG tablet Commonly known as: LEVAQUIN   potassium chloride  10 MEQ tablet Commonly known as: KLOR-CON  M       TAKE these medications    acetaminophen  500 MG tablet Commonly known as: TYLENOL  Take 1,000 mg by mouth every 6 (six) hours as needed for moderate pain.   ascorbic acid  500 MG tablet Commonly known as: VITAMIN C  Take 500 mg by mouth at bedtime.   aspirin  EC 81 MG tablet Take 1 tablet (81 mg total) by mouth daily with breakfast. Swallow whole.   Blood Pressure Cuff Misc 1 each by Does not apply route as directed. Dx: hypertension & heart failure   clindamycin  300 MG capsule Commonly known as: CLEOCIN  Take 1 capsule (300 mg total) by mouth 3 (three) times daily for 4 days.   cyanocobalamin  500 MCG tablet Commonly known as: VITAMIN B12 Take 500 mcg by mouth daily.   FIBER-LAX PO Take 1 capsule by mouth daily.   glipiZIDE  10 MG 24 hr tablet Commonly known as: GLUCOTROL  XL Take 1 tablet (10 mg total) by mouth daily with breakfast. What changed: when to take this   insulin  aspart 100 UNIT/ML injection Commonly known as: novoLOG  Inject 8 Units into the skin at bedtime.   lactobacillus acidophilus & bulgar chewable tablet Chew 1 tablet by mouth 3 (three) times daily with meals for 10 days.   Lantus  SoloStar 100 UNIT/ML Solostar Pen Generic drug: insulin   glargine Inject 12 Units into the skin daily.   LORazepam  0.5 MG tablet Commonly known as: ATIVAN  Take 1 tablet (0.5 mg total) by mouth at bedtime as needed for anxiety or sleep.   metolazone  5 MG tablet Commonly known as: ZAROXOLYN  Take 2.5 mg by mouth 2 (two) times a week.   midodrine  5 MG tablet Commonly known as: PROAMATINE  TAKE 1 TABLET BY MOUTH THREE TIMES DAILY WITH MEALS. What changed: when to take this   nitroGLYCERIN  0.4 MG SL tablet Commonly known as: NITROSTAT  DISSOLVE 1 TABLET UNDER THE TONGUE EVERY 5 MINUTES AS NEEDED FOR CHEST PAIN. DO NOT EXCEED A TOTAL OF 3 DOSES IN 15 MINUTES.  oxyCODONE  5 MG immediate release tablet Commonly known as: Oxy IR/ROXICODONE  Take 0.5-1 tablets (2.5-5 mg total) by mouth every 6 (six) hours as needed for severe pain.   polyethylene glycol 17 g packet Commonly known as: MIRALAX  / GLYCOLAX  Take 17 g by mouth 2 (two) times daily as needed for mild constipation.   potassium chloride  10 MEQ tablet Commonly known as: KLOR-CON  Take 20 mEq by mouth daily as needed. Take with Metalozone   rOPINIRole  0.25 MG tablet Commonly known as: REQUIP  Take 0.25 mg by mouth at bedtime.   rosuvastatin  10 MG tablet Commonly known as: CRESTOR  1 (10 mg tablet) every Wednesday   tamsulosin  0.4 MG Caps capsule Commonly known as: Flomax  Take 1 capsule (0.4 mg total) by mouth daily.               Durable Medical Equipment  (From admission, onward)           Start     Ordered   11/12/23 1119  For home use only DME oxygen  Once       Question Answer Comment  Length of Need 6 Months   Mode or (Route) Nasal cannula   Liters per Minute 2   Frequency Continuous (stationary and portable oxygen unit needed)   Oxygen conserving device Yes   Oxygen delivery system Gas      11/12/23 1118            Follow-up Information     [facility], [facility] Follow up.   Specialty: Home Health Services Why: Home health will call to schedule  your next home visit. Contact information: [address] [place] [address] [phone]                Discharge Exam: [name]   11/10/23 1215 11/10/23 1600  Weight: 97.5 kg 106 kg        General:  AAO x 3,  cooperative, no distress;   HEENT:  Normocephalic, PERRL, otherwise with in Normal limits   Neuro:  CNII-XII intact. , normal motor and sensation, reflexes intact   Lungs:   Clear to auscultation BL, Respirations unlabored,  No wheezes / crackles  Cardio:    S1/S2, RRR, No murmure, No Rubs or Gallops   Abdomen:  Soft, non-tender, bowel sounds active all four quadrants, no guarding or peritoneal signs.  Muscular  skeletal:  Limited exam -global generalized weaknesses - in bed, able to move all 4 extremities,   2+ pulses,  symmetric, edema, erythema has improved  Skin:  Dry, warm to touch, lower extremity right erythema edema -please see picture  Wounds: Please see nursing documentation    :        Condition at discharge: fair  The results of significant diagnostics from this hospitalization (including imaging, microbiology, ancillary and laboratory) are listed below for reference.   Imaging Studies: US  ARTERIAL ABI (SCREENING LOWER EXTREMITY) Result Date: 11/11/2023 CLINICAL DATA:  BILATERAL lower extremity swelling. Hypertension. Hyperlipidemia. Diabetes. EXAM: NONINVASIVE PHYSIOLOGIC VASCULAR STUDY OF BILATERAL LOWER EXTREMITIES TECHNIQUE: Evaluation of both lower extremities were performed at rest, including calculation of ankle-brachial indices with single level pressure measurements and doppler recording. COMPARISON:  None available. FINDINGS: Right ABI:  1.31 Left ABI:  1.26 Right Lower Extremity: Posterior tibial and dorsalis pedis artery waveforms are monophasic. Left Lower Extremity: Posterior tibial and dorsalis pedis artery waveforms are monophasic. 1.0-1.4 Normal IMPRESSION: Ankle brachial indices are within normal limits  bilaterally. Monophasic waveforms at the ankle  are likely artifact given to swelling. If there is continued clinical suspicion for peripheral arterial disease, further evaluation with CT angiography of the abdominal aorta with lower extremity runoff should be performed. Electronically Signed   By: Aliene Lloyd M.D.   On: 11/11/2023 11:07   US  Venous Img Lower Unilateral Left Result Date: 11/10/2023 CLINICAL DATA:  Left lower extremity swelling EXAM: LEFT LOWER EXTREMITY VENOUS DOPPLER ULTRASOUND TECHNIQUE: Gray-scale sonography with compression, as well as color and duplex ultrasound, were performed to evaluate the deep venous system(s) from the level of the common femoral vein through the popliteal and proximal calf veins. COMPARISON:  None Available. FINDINGS: VENOUS Normal compressibility of the common femoral, superficial femoral, and popliteal veins, as well as the visualized calf veins. Visualized portions of profunda femoral vein and great saphenous vein unremarkable. No filling defects to suggest DVT on grayscale or color Doppler imaging. Doppler waveforms show normal direction of venous flow, normal respiratory plasticity and response to augmentation. Limited views of the contralateral common femoral vein are unremarkable. OTHER Edema is present within the superficial soft tissues. Limitations: none IMPRESSION: 1. No evidence of left lower extremity DVT. Electronically Signed   By: Maude Naegeli M.D.   On: 11/10/2023 14:35   DG Chest 2 View Result Date: 11/10/2023 CLINICAL DATA:  Concern for sepsis. EXAM: CHEST - 2 VIEW COMPARISON:  09/04/2022. FINDINGS: Stable cardiomegaly. Aortic atherosclerosis. 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 No focal consolidation, pleural effusion, or pneumothorax. Diffuse osseous demineralization. 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 IMPRESSION:  1. Stable cardiomegaly.  No acute cardiopulmonary findings. 2. 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 Electronically Signed   By: Harrietta Sherry M.D.   On: 11/10/2023 12:57    Microbiology: Results for orders placed or performed during the hospital encounter of 11/10/23  Culture, blood (Routine x 2)     Status: None (Preliminary result)   Collection Time: 11/10/23 12:38 PM   Specimen: BLOOD LEFT ARM  Result Value Ref Range Status   Specimen Description BLOOD LEFT ARM  Final   Special Requests   Final    BOTTLES DRAWN AEROBIC AND ANAEROBIC Blood Culture adequate volume   Culture   Final    NO GROWTH 2 DAYS Performed at [facility], [address], [place], [place] [address]    Report Status PENDING  Incomplete  Culture, blood (Routine x 2)     Status: None (Preliminary result)   Collection Time: 11/10/23 12:38 PM   Specimen: BLOOD RIGHT HAND  Result Value Ref Range Status   Specimen Description BLOOD RIGHT HAND  Final   Special Requests   Final    BOTTLES DRAWN AEROBIC AND ANAEROBIC Blood Culture adequate volume   Culture   Final    NO GROWTH 2 DAYS Performed at [facility], [address], [place], [place] [address]    Report Status PENDING  Incomplete  MRSA Next Gen by PCR, Nasal     Status: None   Collection Time: 11/10/23  4:00 PM   Specimen: Nasal Mucosa; Nasal Swab  Result Value Ref Range Status   MRSA by PCR Next Gen NOT DETECTED NOT DETECTED Final    Comment: (NOTE) The GeneXpert MRSA Assay (FDA approved for NASAL specimens only), is one component of a comprehensive MRSA colonization surveillance program. It is not intended to diagnose MRSA infection nor to guide or monitor treatment for MRSA infections.  Test performance is not FDA approved in patients less than 66 years old. Performed at [facility], [address], [place], [place] [address]     Labs: CBC: Recent Labs  Lab 11/10/23 1238  11/11/23 0424 11/11/23 0919  WBC 8.3 7.0  --   NEUTROABS 5.6  --   --   HGB 12.6* 9.9* 10.0*  HCT 37.9* 31.1* 31.0*  MCV 93.3 93.4  --   PLT 270 203  --    Basic Metabolic Panel: Recent Labs  Lab 11/10/23 1238 11/11/23 0424 11/12/23 0418  NA 137 139 137  K 3.9 3.2* 4.0  CL 100 103 99  CO2 27 26 25   GLUCOSE 188* 68* 116*  BUN 76* 74* 73*  CREATININE 2.70* 2.53* 2.57*  2.55*  CALCIUM  8.8* 8.6* 9.2  MG  --  2.0  --    Liver Function Tests: Recent Labs  Lab 11/10/23 1238 11/11/23 0424  AST 20 16  ALT 15 10  ALKPHOS 102 68  BILITOT 0.5 0.7  PROT 9.2* 7.6  ALBUMIN  2.9* 3.5   CBG: Recent Labs  Lab 11/11/23 0810 11/11/23 1142 11/11/23 1629 11/11/23 2125 11/12/23 0730  GLUCAP 104* 165* 176* 241* 125*    Discharge time spent: greater than 40 minutes.  Signed: Adriana DELENA Grams, MD [facility] 11/12/2023

## 2023-11-12 NOTE — TOC Initial Note (Addendum)
 Transition of Care [facility]) - Initial/Assessment Note    Patient Details  Name: Bradley Sheppard MRN: [id number] Date of Birth: 1940-08-31  Transition of Care [facility] CM/SW Contact:    Noreen KATHEE Pinal, LCSWA Phone Number: 11/12/2023, 12:23 PM  Clinical Narrative:                  CSW spoke with patient and family at bedside and assessed patient. Patient lives in the home with spouse and their daughter comes over to help as needed. Spouse shared that she assist with most of patient needs. Spouse stated that they needed a electric hospital bed, WC, and a 3N1. Family agreeable to [employer] providing DME . Zach with [employer] shared that pt received a Hospital bed and 3N1 in 2023 so pt would not qualify for another bed or 3N1. Spouse then stated that they do have a bed but it is only partial electric and the 3N1 is wobbly. Spouse agreeable to WC and oxygen being ordered. CSW advised spouse and daughter that they would need to order a electric bed and 3N1 and pay privately since insurance will not cover again until 2028. Spouse expressed understanding. TOC to follow.     Expected Discharge Plan: Home w Home Health Services Barriers to Discharge: Continued Medical Work up   Patient Goals and CMS Choice Patient states their goals for this hospitalization and ongoing recovery are:: return back home CMS Medicare.gov Compare Post Acute Care list provided to:: Patient Represenative (must comment) (Spouse - Meade and Daughter - Darice) Choice offered to / list presented to : Spouse, Adult Children      Expected Discharge Plan and Services In-house Referral: Clinical Social Work   Post Acute Care Choice: Horticulturist, commercial, Home Health Living arrangements for the past 2 months: Single Family Home Expected Discharge Date: 11/12/23               DME Arranged: Wheelchair manual and oxygen  DME Agency: [employer] Date DME Agency Contacted: 11/12/23 Time DME Agency Contacted: 1221 Representative spoke  with at DME Agency: Darlyn HH Arranged: PT, OT, Nurse's Aide HH Agency: [facility] Date [facility] Agency Contacted: 11/12/23 Time HH Agency Contacted: 1221 Representative spoke with at [facility] Agency: Darleene  Prior Living Arrangements/Services Living arrangements for the past 2 months: Single Family Home Lives with:: Spouse Patient language and need for interpreter reviewed:: Yes Do you feel safe going back to the place where you live?: Yes      Need for Family Participation in Patient Care: Yes (Comment) Care giver support system in place?: Yes (comment) Current home services: DME, Home PT, Homehealth aide, Home OT Criminal Activity/Legal Involvement Pertinent to Current Situation/Hospitalization: No - Comment as needed  Activities of Daily Living   ADL Screening (condition at time of admission) Independently performs ADLs?: No Does the patient have a NEW difficulty with bathing/dressing/toileting/self-feeding that is expected to last >3 days?: No Does the patient have a NEW difficulty with getting in/out of bed, walking, or climbing stairs that is expected to last >3 days?: No Does the patient have a NEW difficulty with communication that is expected to last >3 days?: No Is the patient deaf or have difficulty hearing?: No Does the patient have difficulty seeing, even when wearing glasses/contacts?: No Does the patient have difficulty concentrating, remembering, or making decisions?: No  Permission Sought/Granted      Share Information with NAME: Meade and Darice     Permission granted to share info w  Relationship: Spouse and daughter     Emotional Assessment Appearance:: Appears stated age Attitude/Demeanor/Rapport: Engaged Affect (typically observed): Accepting, Appropriate Orientation: : Oriented to Self, Oriented to Place, Oriented to Situation Alcohol  / Substance Use: Not Applicable Psych Involvement: No (comment)  Admission diagnosis:  Cellulitis [L03.90] Cellulitis,  unspecified cellulitis site [L03.90] Patient Active Problem List   Diagnosis Date Noted   Cellulitis 11/10/2023   Elevated brain natriuretic peptide (BNP) level 09/05/2022   CAP (community acquired pneumonia) 09/05/2022   Prolonged QT interval 09/05/2022   Hypoalbuminemia due to protein-calorie malnutrition (HCC) 09/05/2022   History of CAD (coronary artery disease) 09/05/2022   DNR (do not resuscitate) 02/03/2022   Goals of care, counseling/discussion 01/31/2022   Catheter-associated urinary tract infection (HCC) 01/30/2022   Physical deconditioning 01/30/2022   Hyponatremia 01/28/2022   Sepsis due to gram-negative UTI (HCC) 01/28/2022   Paroxysmal atrial fibrillation (HCC) 01/28/2022   ILD (interstitial lung disease) (HCC) 01/20/2022   Acute exacerbation of  diastolic CHF (congestive heart failure) (HCC) 01/08/2022   Decubitus skin ulcer 01/08/2022   Urinary retention 01/02/2022   Mass in rectum 12/31/2021   Pressure injury of skin 11/29/2021   Chronic kidney disease, stage 3b (HCC) 11/26/2021   Acute on chronic diastolic CHF (congestive heart failure) (HCC)    SOB (shortness of breath) 11/23/2021   Acute heart failure with preserved ejection fraction (HFpEF) (HCC) 11/23/2021    Class: Acute   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) 11/23/2021   Rectal mass 11/23/2021    Class: Chronic   Bilateral pleural effusion 11/23/2021    Class: Acute   Fecal incontinence alternating with constipation 11/11/2021   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   Iron  deficiency anemia 07/28/2021   Peptic ulcer associated with Helicobacter pylori infection 03/27/2019   Acute on Chronic Anemia superimposed on chronic disease 03/27/2019   Mixed hyperlipidemia 04/03/2009   Essential hypertension 04/03/2009   CORONARY ATHEROSCLEROSIS NATIVE CORONARY ARTERY 04/03/2009   Moderate-Severe Aortic stenosis 02/18/2009   PCP:  Rosamond Leta NOVAK, MD [facility] [place] [facility] [address] [place] [address] Phone: [phone] Fax: [phone]     Social Drivers of Health (SDOH) Social History: SDOH Screenings   Food Insecurity: No Food Insecurity (11/10/2023)  Housing: Low Risk  (11/10/2023)  Transportation Needs: No Transportation Needs (11/10/2023)  Utilities: Not At Risk (11/10/2023)  Financial Resource Strain: Low Risk  (03/27/2019)   Received from [facility]  Physical Activity: Inactive (03/27/2019)   Received from [facility]  Social Connections: Moderately Isolated (11/10/2023)  Stress: No Stress Concern Present (03/27/2019)   Received from [facility]  Tobacco Use: Medium Risk (11/11/2023)  Health Literacy: Medium Risk (08/12/2020)   [facility] from [facility]   SDOH Interventions:     Readmission Risk Interventions    11/12/2023   12:15 PM 09/06/2022   10:32 AM 09/05/2022    5:41 PM  Readmission Risk Prevention Plan  Transportation Screening Complete Complete Complete  HRI or Home Care Consult Complete    Social Work Consult for Recovery Care Planning/Counseling Complete    Palliative Care Screening Not Applicable    Medication Review Oceanographer) Complete Complete Complete  PCP or Specialist appointment within 3-5 days of discharge  Complete Complete  HRI or Home Care Consult  Complete Complete  SW Recovery Care/Counseling Consult   Complete

## 2023-11-12 NOTE — Hospital Course (Signed)
 Bradley Sheppard is a 83 y.o. 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  Patient has had some fever and tachycardia and was started on Levaquin, but not improving.  He has apparently been taking Levaquin over the last 4 days with no improvement and he was seen by nephrology office today with recommendations to be hospitalized for inpatient management.  Patient was admitted for management of left lower extremity cellulitis and is also noted to have AKI on CKD stage IIIb.  AKI slowly improving, but now noted to have worsening hemoglobin levels and stool occult pending.  Noted to have prior history of GI bleed.  Anticipate discharge once cellulitis further improves and creatinine level stabilized along with hemoglobin levels.        Left lower extremity cellulitis in the setting of venous insufficiency - Blood cultures ordered -no growth to date - MRSA negative, discontinue vancomycin  - Continue on vancomycin  and Rocephin  >>> switching to p.o. clindamycin  - Leg elevation and TED hoses - Holding torsemide  dosing-due to elevated creatinine - ABI unremarkable - Left lower extremity without DVT   AKI on CKD stage IIIb-improving - Follows with Dr. Rachele nephrology outpatient - Appears to have some anasarca with low albumin  - Likely in the setting of some hypotension as well, plan to give some albumin  for now and monitor blood pressure readings - Elevate legs - Monitor carefully with home oral torsemide  dosing     Anemia of chronic disease -Mild drop in hemoglobin but stable    Latest Ref Rng & Units 11/11/2023    9:19 AM 11/11/2023    4:24 AM 11/10/2023   12:38 PM  CBC  WBC 4.0 - 10.5 K/uL  7.0  8.3   Hemoglobin 13.0 - 17.0 g/dL 89.9  9.9  87.3   Hematocrit 39.0 - 52.0 % 31.0   31.1  37.9   Platelets 150 - 400 K/uL  203  270     - No signs of bleeding    Chronic HFpEF -No symptoms of dyspnea or orthopnea noted at this time, but volume overloaded -Chest x-ray with stable cardiomegaly - Echo on 09/2022 with EF 55-60% - Plan to continue home diuretics with use of albumin    Hypokalemia -Replete and reevaluate in a.m.   Type 2 diabetes - SSI and carb modified diet   PAF with RVR - Not on anticoagulation due to rectal bleeding and weakness predisposing to falls - Follows with Dr. 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

## 2023-11-13 DIAGNOSIS — L03116 Cellulitis of left lower limb: Secondary | ICD-10-CM | POA: Diagnosis not present

## 2023-11-13 DIAGNOSIS — R5381 Other malaise: Secondary | ICD-10-CM | POA: Diagnosis not present

## 2023-11-13 DIAGNOSIS — J9601 Acute respiratory failure with hypoxia: Secondary | ICD-10-CM | POA: Diagnosis not present

## 2023-11-13 LAB — CBC
HCT: 33.2 % — ABNORMAL LOW (ref 39.0–52.0)
Hemoglobin: 11 g/dL — ABNORMAL LOW (ref 13.0–17.0)
MCH: 30.9 pg (ref 26.0–34.0)
MCHC: 33.1 g/dL (ref 30.0–36.0)
MCV: 93.3 fL (ref 80.0–100.0)
Platelets: 207 K/uL (ref 150–400)
RBC: 3.56 MIL/uL — ABNORMAL LOW (ref 4.22–5.81)
RDW: 13.5 % (ref 11.5–15.5)
WBC: 8.4 K/uL (ref 4.0–10.5)
nRBC: 0 % (ref 0.0–0.2)

## 2023-11-13 LAB — BASIC METABOLIC PANEL WITH GFR
Anion gap: 12 (ref 5–15)
BUN: 78 mg/dL — ABNORMAL HIGH (ref 8–23)
CO2: 26 mmol/L (ref 22–32)
Calcium: 8.8 mg/dL — ABNORMAL LOW (ref 8.9–10.3)
Chloride: 99 mmol/L (ref 98–111)
Creatinine, Ser: 2.56 mg/dL — ABNORMAL HIGH (ref 0.61–1.24)
GFR, Estimated: 24 mL/min — ABNORMAL LOW (ref >60)
Glucose, Bld: 142 mg/dL — ABNORMAL HIGH (ref 70–99)
Potassium: 3.8 mmol/L (ref 3.5–5.1)
Sodium: 137 mmol/L (ref 135–145)

## 2023-11-13 LAB — GLUCOSE, CAPILLARY
Glucose-Capillary: 154 mg/dL — ABNORMAL HIGH (ref 70–99)
Glucose-Capillary: 185 mg/dL — ABNORMAL HIGH (ref 70–99)
Glucose-Capillary: 204 mg/dL — ABNORMAL HIGH (ref 70–99)
Glucose-Capillary: 205 mg/dL — ABNORMAL HIGH (ref 70–99)

## 2023-11-13 LAB — BRAIN NATRIURETIC PEPTIDE: B Natriuretic Peptide: 575 pg/mL — ABNORMAL HIGH (ref 0.0–100.0)

## 2023-11-13 LAB — PREALBUMIN: Prealbumin: 12 mg/dL — ABNORMAL LOW (ref 18–38)

## 2023-11-13 LAB — OCCULT BLOOD X 1 CARD TO LAB, STOOL: Fecal Occult Bld: NEGATIVE

## 2023-11-13 MED ORDER — TORSEMIDE 20 MG PO TABS
40.0000 mg | ORAL_TABLET | Freq: Every day | ORAL | Status: DC
  Administered 2023-11-13 – 2023-11-14 (×2): 40 mg via ORAL
  Filled 2023-11-13 (×2): qty 2

## 2023-11-13 MED ORDER — MAGNESIUM CITRATE PO SOLN
1.0000 | Freq: Once | ORAL | Status: AC
  Administered 2023-11-13: 1 via ORAL
  Filled 2023-11-13: qty 296

## 2023-11-13 NOTE — Progress Notes (Signed)
 PROGRESS NOTE    Bradley Sheppard  FMW:[id number]  DOB: April 07, 1941  DOA: 11/10/2023  PCP: Rosamond Leta NOVAK, MD   Brief Narrative:    Bradley Sheppard is a 83 y.o. 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  Patient has had some fever and tachycardia and was started on Levaquin, but not improving.  He has apparently been taking Levaquin over the last 4 days with no improvement and he was seen by nephrology office today with recommendations to be hospitalized for inpatient management.  Patient was admitted for management of left lower extremity cellulitis and is also noted to have AKI on CKD stage IIIb.  AKI slowly improving, but now noted to have worsening hemoglobin levels and stool occult pending.  Noted to have prior history of GI bleed.  Anticipate discharge once cellulitis further improves and creatinine level stabilized along with hemoglobin levels.   Subjective: The patient was seen and examined this morning, awake alert oriented x 3 No major issues overnight - Off O2 patient desatted addressed - Currently still requiring 2 L of oxygen to maintain O2 sat of 97%  Still complaining of severe generalized weaknesses, unable to ambulate independently, is assist with all ADLs - Insisting to be discharged home   Assessment & Plan:   Principal Problem:   Cellulitis Active Problems:   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)   Essential hypertension   MALT lymphoma   Chronic kidney disease, stage 3b (HCC)   Paroxysmal atrial fibrillation (HCC)   Physical deconditioning   DNR (do not resuscitate)   History of CAD (coronary artery disease)  Assessment and Plan:   Acute respiratory failure - Multifactorial, mild volume overload CHF exacerbation --Desatted to as  low as 83 off oxygen, currently on 2 L of oxygen, satting 97%   Left lower extremity cellulitis in the setting of venous insufficiency - Blood cultures -no growth to date - MRSA negative, discontinue vancomycin  - Continue Rocephin  IV - Leg elevation and TED hoses - Continue home oral torsemide  dosing - ABI unremarkable - Left lower extremity without DVT   AKI on CKD stage IIIb-stabilizing Lab Results  Component Value Date   CREATININE 2.56 (H) 11/13/2023   CREATININE 2.55 (H) 11/12/2023   CREATININE 2.57 (H) 11/12/2023    - Follows with Dr. Rachele nephrology outpatient - Appears to have some anasarca with low albumin  - Likely in the setting of some hypotension as well, plan to give some S/p Albumin  and monitor blood pressure readings - Elevate legs - Monitor carefully with home oral torsemide  dosing - Monitor strict I's and O's and consider nephrology evaluation by a.m.  Intake/Output Summary (Last 24 hours) at 11/13/2023 1047 Last data filed at 11/13/2023 9072 Gross per 24 hour  Intake 880 ml  Output 2150 ml  Net -1270 ml    Anemia of chronic disease-with extensive history of MALT lymphoma -No BM yet, pending Hemoccult - Changed heparin  to SCDs -Check anemia panel - Hemoglobin/hematocrit stable   Chronic HFpEF -No symptoms of dyspnea or orthopnea noted at this time, but volume overloaded -Chest x-ray with stable cardiomegaly - Echo on 09/2022 with EF 55-60% - Plan to continue home Diuretics with use of albumin   Hypokalemia - Monitoring and repleted   Type 2 diabetes -  SSI and carb modified diet   PAF with RVR - Not on anticoagulation due to rectal bleeding and weakness predisposing to falls - Follows with Dr. Debera outpatient - Coreg  held due to lower blood pressure readings and takes midodrine  for blood pressure support - Patient not a candidate for DCCV   CAD - History of DES to LAD and RCA in 2003 - Continue aspirin , and rosuvastatin , no Coreg  due to  hypotension   MALT lymphoma - Follows with Dr. 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   Consultants:  None   Procedures:  None  Antimicrobials:  Anti-infectives (From admission, onward)    Start     Dose/Rate Route Frequency Ordered Stop   11/12/23 1600  vancomycin  (VANCOREADY) IVPB 1500 mg/300 mL  Status:  Discontinued        1,500 mg 150 mL/hr over 120 Minutes Intravenous Every 48 hours 11/10/23 1551 11/13/23 1042   11/12/23 0000  clindamycin  (CLEOCIN ) 300 MG capsule        300 mg Oral 3 times daily 11/12/23 1134 11/16/23 2359   11/11/23 1000  cefTRIAXone  (ROCEPHIN ) 2 g in sodium chloride  0.9 % 100 mL IVPB        2 g 200 mL/hr over 30 Minutes Intravenous Every 24 hours 11/10/23 1535     11/10/23 1600  vancomycin  (VANCOREADY) IVPB 2000 mg/400 mL        2,000 mg 200 mL/hr over 120 Minutes Intravenous  Once 11/10/23 1548 11/10/23 1848   11/10/23 1330  cefTRIAXone  (ROCEPHIN ) 2 g in sodium chloride  0.9 % 100 mL IVPB        2 g 200 mL/hr over 30 Minutes Intravenous  Once 11/10/23 1323 11/10/23 1427        Objective: Vitals:   11/13/23 0738 11/13/23 0758 11/13/23 0800 11/13/23 0900  BP:  (!) 113/57 138/79 129/77  Pulse:  93 93 86  Resp:  16 20 16   Temp: 98.1 F (36.7 C)     TempSrc: Oral     SpO2:  96% 97%   Weight:      Height:        Intake/Output Summary (Last 24 hours) at  11/13/2023 1043 Last data filed at 11/13/2023 9072 Gross per 24 hour  Intake 880 ml  Output 2150 ml  Net -1270 ml   Filed Weights   11/10/23 1215 11/10/23 1600  Weight: 97.5 kg 106 kg    Examination:   General:  AAO x 3,  cooperative, no distress;   HEENT:  Normocephalic, PERRL, otherwise with in Normal limits   Neuro:  CNII-XII intact. , normal motor and sensation, reflexes intact   Lungs:   Clear to auscultation BL, Respirations unlabored,  No wheezes / crackles  Cardio:    S1/S2, RRR, No murmure, No Rubs or Gallops   Abdomen:  Soft, non-tender, bowel sounds active all four quadrants, no guarding or peritoneal signs.  Muscular  skeletal:  Limited exam -Sever global generalized weaknesses -Bedbound - in bed, able to move all 4 extremities,   2+ pulses,  symmetric, +2  pitting edema  Skin:  Dry, warm to touch, negative for any Rashes,  Wounds: Please see nursing documentation Significant improvement in lower extremity noted           Psychiatry: Flat affect.    Data Reviewed: I have personally reviewed following labs and imaging studies  CBC: Recent Labs  Lab 11/10/23 1238 11/11/23 0424 11/11/23 0919 11/13/23 0436  WBC 8.3 7.0  --  8.4  NEUTROABS 5.6  --   --   --   HGB 12.6* 9.9* 10.0* 11.0*  HCT 37.9* 31.1* 31.0* 33.2*  MCV 93.3 93.4  --  93.3  PLT 270 203  --  207   Basic Metabolic Panel: Recent Labs  Lab 11/10/23 1238 11/11/23 0424 11/12/23 0418 11/13/23 0436  NA 137 139 137 137  K 3.9 3.2* 4.0 3.8  CL 100 103 99 99  CO2 27 26 25 26   GLUCOSE 188* 68* 116* 142*  BUN 76* 74* 73* 78*  CREATININE 2.70* 2.53* 2.57*  2.55* 2.56*  CALCIUM  8.8* 8.6* 9.2 8.8*  MG  --  2.0  --   --    GFR: Estimated Creatinine Clearance: 26.6 mL/min (A) (by C-G formula based on SCr of 2.56 mg/dL (H)). Liver Function Tests: Recent Labs  Lab 11/10/23 1238 11/11/23 0424  AST 20 16  ALT 15 10  ALKPHOS 102 68  BILITOT 0.5 0.7  PROT 9.2* 7.6  ALBUMIN  2.9* 3.5    No results for input(s): LIPASE, AMYLASE in the last 168 hours. No results for input(s): AMMONIA in the last 168 hours. Coagulation Profile: Recent Labs  Lab 11/10/23 1238  INR 1.1    HbA1C: Recent Labs    11/10/23 1238  HGBA1C 8.4*   CBG: Recent Labs  Lab 11/12/23 0730 11/12/23 1125 11/12/23 1613 11/12/23 2049 11/13/23 0739  GLUCAP 125* 189* 176* 288* 154*    Anemia Panel: Recent Labs    11/11/23 0424 11/12/23 0900  VITAMINB12 532  --   FOLATE 14.4  --   FERRITIN 134  --   TIBC 147* 139*  IRON  39* 19*  RETICCTPCT 1.1  --    Sepsis Labs: Recent Labs  Lab 11/10/23 1238  LATICACIDVEN 1.7    Recent Results (from the past 240 hours)  Culture, blood (Routine x 2)     Status: None (Preliminary result)   Collection Time: 11/10/23 12:38 PM   Specimen: BLOOD LEFT ARM  Result Value Ref Range Status   Specimen Description BLOOD LEFT ARM  Final   Special Requests   Final    BOTTLES DRAWN AEROBIC AND ANAEROBIC Blood Culture adequate volume   Culture   Final    NO GROWTH 3 DAYS Performed at [facility], [address] [place], [place] [address]    Report Status PENDING  Incomplete  Culture, blood (Routine x 2)     Status: None (Preliminary result)   Collection Time: 11/10/23 12:38 PM   Specimen: BLOOD RIGHT HAND  Result Value Ref Range Status   Specimen Description BLOOD RIGHT HAND  Final   Special Requests   Final    BOTTLES DRAWN AEROBIC AND ANAEROBIC Blood Culture adequate volume   Culture   Final    NO GROWTH 3 DAYS Performed at [facility], [address], [place], [place] [address]    Report Status PENDING  Incomplete  MRSA Next Gen by PCR, Nasal     Status: None   Collection Time: 11/10/23  4:00 PM   Specimen: Nasal Mucosa; Nasal Swab  Result Value Ref Range Status   MRSA by PCR Next Gen NOT DETECTED NOT DETECTED Final    Comment: (NOTE) The GeneXpert MRSA Assay (FDA approved for NASAL specimens only), is one component of a  comprehensive MRSA colonization surveillance program. It is not intended to diagnose MRSA infection nor to guide or monitor treatment for MRSA infections. Test performance is not FDA approved in patients less than 87 years old. Performed at [facility], [address] [place], [place] [address]          Radiology Studies: US  ARTERIAL ABI (SCREENING LOWER EXTREMITY) Result Date: 11/11/2023 CLINICAL DATA:  BILATERAL lower extremity swelling. Hypertension. Hyperlipidemia. Diabetes. EXAM: NONINVASIVE PHYSIOLOGIC VASCULAR STUDY OF BILATERAL LOWER EXTREMITIES TECHNIQUE: Evaluation of both lower extremities were performed at rest, including calculation of ankle-brachial indices with single level pressure measurements and doppler recording. COMPARISON:  None available. FINDINGS: Right ABI:  1.31 Left ABI:  1.26 Right Lower Extremity: Posterior tibial and dorsalis pedis artery waveforms are monophasic. Left Lower Extremity: Posterior tibial and dorsalis pedis artery waveforms are monophasic. 1.0-1.4 Normal IMPRESSION: Ankle brachial indices are within normal limits bilaterally. Monophasic waveforms at the ankle are likely artifact given to swelling. If there is continued clinical suspicion for peripheral arterial disease, further evaluation with CT angiography of the abdominal aorta with lower extremity runoff should be performed. Electronically Signed   By: Aliene Lloyd M.D.   On: 11/11/2023 11:07        Scheduled Meds:  aspirin  EC  81 mg Oral Q breakfast   Chlorhexidine  Gluconate Cloth  6 each Topical Q0600   diclofenac  Sodium  2 g Topical QID   insulin  aspart  0-5 Units Subcutaneous QHS   insulin  aspart  0-9 Units Subcutaneous TID WC   magnesium  citrate  1 Bottle Oral Once   metolazone   2.5 mg Oral Once per day on Monday Thursday   midodrine   5 mg Oral TID WC   polyethylene glycol  17 g Oral Daily   rOPINIRole   0.25 mg Oral QHS   rosuvastatin   10 mg Oral Daily   senna-docusate  1 tablet  Oral BID   tamsulosin   0.4 mg Oral Daily   torsemide   40 mg Oral Daily   Continuous Infusions:  cefTRIAXone  (ROCEPHIN )  IV 2 g (11/13/23 0900)     LOS: 2 days    Time spent: 55 minutes    SIGNED: Adriana DELENA Grams, MD, FHM. FAAFP [facility],  Pager (please use [email or web address] to page/text)  Please use Epic Secure Chat for non-urgent communication (7AM-7PM) If 7PM-7AM, please contact night-coverage [email or web address],  11/13/2023, 10:43 AM

## 2023-11-13 NOTE — Plan of Care (Signed)
  Problem: Acute Rehab PT Goals(only PT should resolve) Goal: Pt Will Go Supine/Side To Sit Outcome: Progressing Flowsheets (Taken 11/13/2023 1132) Pt will go Supine/Side to Sit: with minimal assist Goal: Patient Will Transfer Sit To/From Stand Outcome: Progressing Flowsheets (Taken 11/13/2023 1132) Patient will transfer sit to/from stand: with minimal assist Goal: Pt Will Transfer Bed To Chair/Chair To Bed Outcome: Progressing Flowsheets (Taken 11/13/2023 1132) Pt will Transfer Bed to Chair/Chair to Bed: with min assist Goal: Pt Will Ambulate Outcome: Progressing Flowsheets (Taken 11/13/2023 1132) Pt will Ambulate:  10 feet  with minimal assist  with rolling walker

## 2023-11-13 NOTE — Evaluation (Signed)
 Physical Therapy Evaluation Patient Details Name: Bradley Sheppard MRN: [id number] DOB: 20-Jun-1940 Today's Date: 11/13/2023  History of Present Illness  Bradley Sheppard is a 83 y.o. 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  Patient has had some fever and tachycardia and was started on Levaquin, but not improving.  He has apparently been taking Levaquin over the last 4 days with no improvement and he was seen by nephrology office today with recommendations to be hospitalized for inpatient management.  He is also noted to have worsening creatinine levels.  He has supposedly been taking his home diuretics and other medications as prescribed.    Clinical Impression  Patient lying in bed on therapist arrival learning from nursing how to use incentive spirometer.  He is agreeable to therapist assessment.  Patient needs moderate assist for supine to sit with HOB elevated. He demonstrates decreased flexion of the left knee so sits on the edge of the bed with fair to good balance when right foot is on the floor.  Patient perform sit to stand to RW with mod assist.  The first stand he is afraid of falling and we sit back down but he is able to stand with improved confidence and posturing the 2nd stand.  Patient is able to take a couple of steps over to the chair with mod assist and needs max cues to safely sit.  patient left in chair with chair alarm set with call button in reach and nursing notified of mobility status. Patient will benefit from continued skilled therapy services during the remainder of his hospital stay and at the next recommended venue of care to address deficits and promote return to optimal function.             If plan is discharge home, recommend the following: A lot of  help with walking and/or transfers;A lot of help with bathing/dressing/bathroom;Help with stairs or ramp for entrance   Can travel by private vehicle   No    Equipment Recommendations    Recommendations for Other Services       Functional Status Assessment Patient has had a recent decline in their functional status and demonstrates the ability to make significant improvements in function in a reasonable and predictable amount of time.     Precautions / Restrictions Precautions Precautions: Fall Recall of Precautions/Restrictions: Intact Restrictions Weight Bearing Restrictions Per Provider Order: No      Mobility  Bed Mobility Overal bed mobility: Needs Assistance Bed Mobility: Supine to Sit     Supine to sit: HOB elevated, Used rails, Mod assist       Patient Response: Cooperative  Transfers Overall transfer level: Needs assistance Equipment used: Rolling walker (2 wheels) Transfers: Sit to/from Stand, Bed to chair/wheelchair/BSC Sit to Stand: Mod assist   Step pivot transfers: Mod assist            Ambulation/Gait Ambulation/Gait assistance: Mod assist Gait Distance (Feet): 2 Feet Assistive device: Rolling walker (2 wheels) Gait Pattern/deviations: Trunk flexed, Antalgic       General Gait Details: limited flexion of the right knee  Stairs            Wheelchair Mobility     Tilt Bed Tilt Bed Patient Response: Cooperative  Modified Rankin (Stroke Patients Only)  Balance Overall balance assessment: Needs assistance Sitting-balance support: Bilateral upper extremity supported, Feet supported Sitting balance-Leahy Scale: Fair Sitting balance - Comments: fair to good sitting balance on the edge of the bed with one hand on hospital railing   Standing balance support: Bilateral upper extremity supported, During functional activity, Reliant on assistive device for balance Standing balance-Leahy Scale: Fair Standing balance comment:  poor to fair standing balance with RW; tends to forward flex at the trunk; initially afraid to fall                             Pertinent Vitals/Pain Pain Assessment Pain Assessment: No/denies pain    Home Living Family/patient expects to be discharged to:: Private residence Living Arrangements: Spouse/significant other Available Help at Discharge: Family;Available 24 hours/day Type of Home: House Home Access: Ramped entrance       Home Layout: One level Home Equipment: Agricultural consultant (2 wheels);BSC/3in1;Hospital bed;Wheelchair - manual Additional Comments: per patient needs new WC    Prior Function Prior Level of Function : Needs assist       Physical Assist : Mobility (physical);ADLs (physical) Mobility (physical): Bed mobility;Transfers;Gait ADLs (physical): Dressing;Toileting;Bathing Mobility Comments: assist for supine to sit; sit to stand to RW and transfer to the [facility]; uses WC for mobility in home ADLs Comments: needs assist for bathing, toileting and dressing; able to feed himself     Extremity/Trunk Assessment   Upper Extremity Assessment Upper Extremity Assessment: Generalized weakness    Lower Extremity Assessment Lower Extremity Assessment: Generalized weakness       Communication   Communication Communication: No apparent difficulties (slight HOH)    Cognition Arousal: Alert Behavior During Therapy: WFL for tasks assessed/performed   PT - Cognitive impairments: No apparent impairments                         Following commands: Intact       Cueing       General Comments      Exercises     Assessment/Plan    PT Assessment Patient needs continued PT services  PT Problem List Decreased strength;Decreased activity tolerance;Decreased balance;Decreased mobility       PT Treatment Interventions Functional mobility training;Therapeutic activities;Therapeutic exercise;Balance training;Patient/family education    PT  Goals (Current goals can be found in the Care Plan section)  Acute Rehab PT Goals Patient Stated Goal: to return home; for daughter to return home after rehab PT Goal Formulation: With patient/family Time For Goal Achievement: 11/27/23 Potential to Achieve Goals: Good    Frequency Min 3X/week     Co-evaluation               AM-PAC PT 6 Clicks Mobility  Outcome Measure Help needed turning from your back to your side while in a flat bed without using bedrails?: A Lot Help needed moving from lying on your back to sitting on the side of a flat bed without using bedrails?: A Lot Help needed moving to and from a bed to a chair (including a wheelchair)?: A Lot Help needed standing up from a chair using your arms (e.g., wheelchair or bedside chair)?: A Lot Help needed to walk in hospital room?: A Lot Help needed climbing 3-5 steps with a railing? : Total 6 Click Score: 11    End of Session Equipment Utilized During Treatment: Gait belt Activity Tolerance: Patient limited by fatigue Patient left: in chair;with call  bell/phone within reach;with chair alarm set Nurse Communication: Mobility status PT Visit Diagnosis: Unsteadiness on feet (R26.81);Other abnormalities of gait and mobility (R26.89);Muscle weakness (generalized) (M62.81)    Time: 0930-1009 PT Time Calculation (min) (ACUTE ONLY): 39 min   Charges:   PT Evaluation $PT Eval Moderate Complexity: 1 Mod   PT General Charges $$ ACUTE PT VISIT: 1 Visit         11:31 AM, 11/13/23 Japleen Tornow Small Doran Nestle MPT [facility] [place] [phone] Ph:[phone]

## 2023-11-14 DIAGNOSIS — L03116 Cellulitis of left lower limb: Secondary | ICD-10-CM | POA: Diagnosis not present

## 2023-11-14 LAB — CBC
HCT: 35.4 % — ABNORMAL LOW (ref 39.0–52.0)
Hemoglobin: 11.6 g/dL — ABNORMAL LOW (ref 13.0–17.0)
MCH: 30.6 pg (ref 26.0–34.0)
MCHC: 32.8 g/dL (ref 30.0–36.0)
MCV: 93.4 fL (ref 80.0–100.0)
Platelets: 221 K/uL (ref 150–400)
RBC: 3.79 MIL/uL — ABNORMAL LOW (ref 4.22–5.81)
RDW: 13.3 % (ref 11.5–15.5)
WBC: 8.6 K/uL (ref 4.0–10.5)
nRBC: 0 % (ref 0.0–0.2)

## 2023-11-14 LAB — GLUCOSE, CAPILLARY
Glucose-Capillary: 198 mg/dL — ABNORMAL HIGH (ref 70–99)
Glucose-Capillary: 225 mg/dL — ABNORMAL HIGH (ref 70–99)
Glucose-Capillary: 238 mg/dL — ABNORMAL HIGH (ref 70–99)
Glucose-Capillary: 284 mg/dL — ABNORMAL HIGH (ref 70–99)

## 2023-11-14 LAB — BASIC METABOLIC PANEL WITH GFR
Anion gap: 11 (ref 5–15)
BUN: 76 mg/dL — ABNORMAL HIGH (ref 8–23)
CO2: 26 mmol/L (ref 22–32)
Calcium: 8.8 mg/dL — ABNORMAL LOW (ref 8.9–10.3)
Chloride: 97 mmol/L — ABNORMAL LOW (ref 98–111)
Creatinine, Ser: 2.25 mg/dL — ABNORMAL HIGH (ref 0.61–1.24)
GFR, Estimated: 28 mL/min — ABNORMAL LOW (ref >60)
Glucose, Bld: 177 mg/dL — ABNORMAL HIGH (ref 70–99)
Potassium: 3.3 mmol/L — ABNORMAL LOW (ref 3.5–5.1)
Sodium: 134 mmol/L — ABNORMAL LOW (ref 135–145)

## 2023-11-14 MED ORDER — SENNOSIDES-DOCUSATE SODIUM 8.6-50 MG PO TABS
1.0000 | ORAL_TABLET | Freq: Every day | ORAL | Status: DC
  Administered 2023-11-15 – 2023-11-18 (×4): 1 via ORAL
  Filled 2023-11-14 (×5): qty 1

## 2023-11-14 MED ORDER — POTASSIUM CHLORIDE CRYS ER 20 MEQ PO TBCR
40.0000 meq | EXTENDED_RELEASE_TABLET | Freq: Once | ORAL | Status: AC
  Administered 2023-11-14: 40 meq via ORAL
  Filled 2023-11-14: qty 2

## 2023-11-14 NOTE — Progress Notes (Signed)
 PROGRESS NOTE    Bradley Sheppard  FMW:[id number]  DOB: [birth year]  DOA: 11/10/2023  PCP: Rosamond Leta NOVAK, MD   Brief Narrative:    Bradley Sheppard is a 83 y.o. 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  Patient has had some fever and tachycardia and was started on Levaquin, but not improving.  He has apparently been taking Levaquin over the last 4 days with no improvement and he was seen by nephrology office today with recommendations to be hospitalized for inpatient management.  Patient was admitted for management of left lower extremity cellulitis and is also noted to have AKI on CKD stage IIIb.  AKI slowly improving, but now noted to have worsening hemoglobin levels and stool occult pending.  Noted to have prior history of GI bleed.  Anticipate discharge once cellulitis further improves and creatinine level stabilized along with hemoglobin levels.   Subjective:  The patient was seen and examined this morning, stable no acute distress Per nursing staff severely debilitated, needs 2 person assist with all activities, Needs assist with all ADLs No issues overnight, satting 94% on 2 L of oxygen Tachypneic with RR of 41, tachypneic pulse of 103 blood pressure 118/43   Assessment & Plan:   Principal Problem:   Cellulitis Active Problems:   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)   Essential hypertension   MALT lymphoma   Chronic kidney disease, stage 3b (HCC)   Paroxysmal atrial fibrillation (HCC)   Physical deconditioning   DNR (do not resuscitate)   History of CAD (coronary artery disease)  Assessment and Plan:   Acute respiratory failure - Multifactorial, mild volume overload CHF exacerbation -Improving --Desatted to as low as 83 off oxygen, currently  on 2 L of oxygen, satting 94% - Continuing nebs and p.o. torsemide    Left lower extremity cellulitis in the setting of venous insufficiency - Blood cultures -no growth to date - MRSA negative, discontinue vancomycin  - Continue Rocephin  IV - Leg elevation and TED hoses - Continue home oral torsemide  dosing - ABI unremarkable - Left lower extremity without DVT   AKI on CKD stage IIIb-stabilizing Lab Results  Component Value Date   CREATININE 2.25 (H) 11/14/2023   CREATININE 2.56 (H) 11/13/2023   CREATININE 2.55 (H) 11/12/2023   CREATININE 2.57 (H) 11/12/2023    - Follows with Dr. Rachele nephrology outpatient - Appears to have some anasarca with low albumin  - Likely in the setting of some hypotension as well, plan to give some S/p Albumin  and monitor blood pressure readings - Elevate legs - Monitor carefully with home oral torsemide  dosing - Monitor strict I's and O's and consider nephrology evaluation by a.m.  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 Debera outpatient - Coreg  held due to lower blood pressure readings and takes midodrine  for blood pressure support - Patient not a candidate for DCCV   CAD - History of DES to LAD and RCA in 2003 - Continue  aspirin , and rosuvastatin , no Coreg  due to hypotension   MALT lymphoma - Follows with Dr. 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   Consultants:  PT/OT/TOC/palliative care team  Procedures:  None  Antimicrobials:  Anti-infectives (From admission, onward)    Start     Dose/Rate Route Frequency Ordered Stop   11/12/23 1600  vancomycin  (VANCOREADY) IVPB 1500 mg/300 mL  Status:  Discontinued        1,500 mg 150 mL/hr over 120 Minutes Intravenous Every 48 hours 11/10/23 1551 11/13/23 1042   11/12/23 0000  clindamycin  (CLEOCIN ) 300 MG capsule        300 mg Oral 3 times daily 11/12/23 1134 11/16/23 2359   11/11/23 1000  cefTRIAXone  (ROCEPHIN ) 2 g in sodium chloride  0.9 % 100 mL IVPB        2 g 200 mL/hr over 30 Minutes Intravenous Every 24 hours 11/10/23 1535     11/10/23 1600  vancomycin  (VANCOREADY) IVPB 2000 mg/400 mL        2,000 mg 200 mL/hr over 120 Minutes Intravenous  Once 11/10/23 1548 11/10/23 1848   11/10/23 1330  cefTRIAXone  (ROCEPHIN ) 2 g in sodium chloride  0.9 % 100 mL IVPB        2 g 200 mL/hr over 30 Minutes Intravenous  Once 11/10/23 1323 11/10/23 1427        Objective: Vitals:   11/14/23 0525 11/14/23 0600 11/14/23 0700 11/14/23 0800  BP:  96/67 (!) 137/90 (!) 118/43  Pulse:  100 69 (!) 103  Resp:  15 (!) 26 (!) 21  Temp: 98.1 F (36.7 C)     TempSrc: Axillary      SpO2:  96% 98% 94%  Weight: 103.6 kg     Height:        Intake/Output Summary (Last 24 hours) at 11/14/2023 1107 Last data filed at 11/14/2023 0931 Gross per 24 hour  Intake 360 ml  Output 1550 ml  Net -1190 ml   Filed Weights   11/10/23 1215 11/10/23 1600 11/14/23 0525  Weight: 97.5 kg 106 kg 103.6 kg        General:  AAO x 3,  cooperative, no distress;   HEENT:  Normocephalic, PERRL, otherwise with in Normal limits   Neuro:  CNII-XII intact. , normal motor and sensation, reflexes intact   Lungs:   Clear to auscultation BL, Respirations unlabored,  No wheezes / crackles  Cardio:    S1/S2, RRR, No murmure, No Rubs or Gallops   Abdomen:  Soft, non-tender, bowel sounds active all four quadrants, no guarding or peritoneal signs.  Muscular  skeletal:  Bedbound can only be moved with two-person assist Limited exam -global generalized weaknesses - in bed, able to move all 4 extremities,   2+ pulses,  symmetric, No pitting edema  Skin:  Dry, warm to touch, negative for any Rashes,  Wounds: Please see nursing documentation        on Significant improvement in lower extremity noted           Psychiatry: Flat affect.    Data Reviewed: I have personally reviewed following labs and imaging studies  CBC: Recent Labs  Lab 11/10/23 1238 11/11/23 0424 11/11/23 0919 11/13/23 0436 11/14/23 0432  WBC 8.3 7.0  --  8.4 8.6  NEUTROABS 5.6  --   --   --   --   HGB 12.6* 9.9* 10.0* 11.0* 11.6*  HCT 37.9* 31.1* 31.0* 33.2* 35.4*  MCV 93.3 93.4  --  93.3 93.4  PLT 270 203  --  207 221   Basic Metabolic Panel: Recent Labs  Lab 11/10/23 1238 11/11/23 0424 11/12/23 0418 11/13/23 0436 11/14/23 0432  NA 137 139 137 137 134*  K 3.9 3.2* 4.0 3.8 3.3*  CL 100 103 99 99 97*  CO2 27 26 25 26 26   GLUCOSE 188* 68* 116* 142* 177*  BUN 76* 74* 73* 78* 76*  CREATININE 2.70* 2.53* 2.57*  2.55* 2.56* 2.25*  CALCIUM  8.8* 8.6* 9.2 8.8* 8.8*  MG  --  2.0  --   --   --     GFR: Estimated Creatinine Clearance: 29.9 mL/min (A) (by C-G formula based on SCr of 2.25 mg/dL (H)). Liver Function Tests: Recent Labs  Lab 11/10/23 1238 11/11/23 0424  AST 20 16  ALT 15 10  ALKPHOS 102 68  BILITOT 0.5 0.7  PROT 9.2* 7.6  ALBUMIN  2.9* 3.5   No results for input(s): LIPASE, AMYLASE in the last 168 hours. No results for input(s): AMMONIA in the last 168 hours. Coagulation Profile: Recent Labs  Lab 11/10/23 1238  INR 1.1    HbA1C: No results for input(s): HGBA1C in the last 72 hours.  CBG: Recent Labs  Lab 11/13/23 0739 11/13/23 1144 11/13/23 1641 11/13/23 2057 11/14/23 0805  GLUCAP 154* 185* 204* 205* 198*    Anemia Panel: Recent Labs    11/12/23 0900  TIBC 139*  IRON  19*   Sepsis Labs: Recent Labs  Lab 11/10/23 1238  LATICACIDVEN 1.7    Recent Results (from the past 240 hours)  Culture, blood (Routine x 2)     Status: None (Preliminary result)   Collection Time: 11/10/23 12:38 PM   Specimen: BLOOD LEFT ARM  Result Value Ref Range Status   Specimen Description BLOOD LEFT ARM  Final   Special Requests   Final    BOTTLES DRAWN AEROBIC AND ANAEROBIC Blood Culture adequate volume   Culture   Final    NO GROWTH 4 DAYS Performed at [facility], [address], [place], [place] [address]    Report Status PENDING  Incomplete  Culture, blood (Routine x 2)     Status: None (Preliminary result)   Collection Time: 11/10/23 12:38 PM   Specimen: BLOOD RIGHT HAND  Result Value Ref Range Status   Specimen Description BLOOD RIGHT HAND  Final   Special Requests   Final    BOTTLES DRAWN AEROBIC AND ANAEROBIC Blood Culture adequate volume   Culture   Final  NO GROWTH 4 DAYS Performed at [facility], [address] [place], [place] [address]    Report Status PENDING  Incomplete  MRSA Next Gen by PCR, Nasal     Status: None   Collection Time: 11/10/23  4:00 PM   Specimen: Nasal Mucosa; Nasal Swab  Result Value Ref Range Status    MRSA by PCR Next Gen NOT DETECTED NOT DETECTED Final    Comment: (NOTE) The GeneXpert MRSA Assay (FDA approved for NASAL specimens only), is one component of a comprehensive MRSA colonization surveillance program. It is not intended to diagnose MRSA infection nor to guide or monitor treatment for MRSA infections. Test performance is not FDA approved in patients less than 41 years old. Performed at [facility], [address], [place], [place] [address]         Scheduled Meds:  aspirin  EC  81 mg Oral Q breakfast   Chlorhexidine  Gluconate Cloth  6 each Topical Q0600   diclofenac  Sodium  2 g Topical QID   insulin  aspart  0-5 Units Subcutaneous QHS   insulin  aspart  0-9 Units Subcutaneous TID WC   metolazone   2.5 mg Oral Once per day on Monday Thursday   midodrine   5 mg Oral TID WC   polyethylene glycol  17 g Oral Daily   rOPINIRole   0.25 mg Oral QHS   rosuvastatin   10 mg Oral Daily   senna-docusate  1 tablet Oral QHS   tamsulosin   0.4 mg Oral Daily   torsemide   40 mg Oral Daily   Continuous Infusions:  cefTRIAXone  (ROCEPHIN )  IV 2 g (11/14/23 1054)     LOS: 3 days    Time spent: 55 minutes    SIGNED: Adriana DELENA Grams, MD, FHM. FAAFP [facility],  Pager (please use [email or web address] to page/text)  Please use Epic Secure Chat for non-urgent communication (7AM-7PM) If 7PM-7AM, please contact night-coverage [email or web address],  11/14/2023, 11:07 AM

## 2023-11-15 DIAGNOSIS — R5381 Other malaise: Secondary | ICD-10-CM | POA: Diagnosis not present

## 2023-11-15 DIAGNOSIS — N179 Acute kidney failure, unspecified: Secondary | ICD-10-CM | POA: Diagnosis not present

## 2023-11-15 DIAGNOSIS — L03116 Cellulitis of left lower limb: Secondary | ICD-10-CM | POA: Diagnosis not present

## 2023-11-15 LAB — CULTURE, BLOOD (ROUTINE X 2)
Culture: NO GROWTH
Culture: NO GROWTH
Special Requests: ADEQUATE
Special Requests: ADEQUATE

## 2023-11-15 LAB — GLUCOSE, CAPILLARY
Glucose-Capillary: 202 mg/dL — ABNORMAL HIGH (ref 70–99)
Glucose-Capillary: 258 mg/dL — ABNORMAL HIGH (ref 70–99)
Glucose-Capillary: 222 mg/dL — ABNORMAL HIGH (ref 70–99)
Glucose-Capillary: 263 mg/dL — ABNORMAL HIGH (ref 70–99)

## 2023-11-15 LAB — CBC
HCT: 33.8 % — ABNORMAL LOW (ref 39.0–52.0)
Hemoglobin: 11.1 g/dL — ABNORMAL LOW (ref 13.0–17.0)
MCH: 30.2 pg (ref 26.0–34.0)
MCHC: 32.8 g/dL (ref 30.0–36.0)
MCV: 92.1 fL (ref 80.0–100.0)
Platelets: 230 K/uL (ref 150–400)
RBC: 3.67 MIL/uL — ABNORMAL LOW (ref 4.22–5.81)
RDW: 13.3 % (ref 11.5–15.5)
WBC: 8.7 K/uL (ref 4.0–10.5)
nRBC: 0 % (ref 0.0–0.2)

## 2023-11-15 LAB — BASIC METABOLIC PANEL WITH GFR
Anion gap: 11 (ref 5–15)
BUN: 85 mg/dL — ABNORMAL HIGH (ref 8–23)
CO2: 28 mmol/L (ref 22–32)
Calcium: 8.5 mg/dL — ABNORMAL LOW (ref 8.9–10.3)
Chloride: 97 mmol/L — ABNORMAL LOW (ref 98–111)
Creatinine, Ser: 2.71 mg/dL — ABNORMAL HIGH (ref 0.61–1.24)
GFR, Estimated: 22 mL/min — ABNORMAL LOW (ref >60)
Glucose, Bld: 194 mg/dL — ABNORMAL HIGH (ref 70–99)
Potassium: 3.6 mmol/L (ref 3.5–5.1)
Sodium: 136 mmol/L (ref 135–145)

## 2023-11-15 MED ORDER — TORSEMIDE 20 MG PO TABS
20.0000 mg | ORAL_TABLET | Freq: Two times a day (BID) | ORAL | Status: DC
  Administered 2023-11-15: 20 mg via ORAL
  Filled 2023-11-15: qty 1

## 2023-11-15 MED ORDER — ALBUMIN HUMAN 25 % IV SOLN
25.0000 g | Freq: Once | INTRAVENOUS | Status: AC
  Administered 2023-11-15: 25 g via INTRAVENOUS
  Filled 2023-11-15: qty 100

## 2023-11-15 MED ORDER — FERROUS SULFATE 325 (65 FE) MG PO TABS
325.0000 mg | ORAL_TABLET | Freq: Two times a day (BID) | ORAL | Status: DC
  Administered 2023-11-15 – 2023-11-19 (×9): 325 mg via ORAL
  Filled 2023-11-15 (×9): qty 1

## 2023-11-15 MED ORDER — TORSEMIDE 20 MG PO TABS
20.0000 mg | ORAL_TABLET | Freq: Two times a day (BID) | ORAL | Status: DC
  Administered 2023-11-16 – 2023-11-17 (×3): 20 mg via ORAL
  Filled 2023-11-15 (×3): qty 1

## 2023-11-15 NOTE — Progress Notes (Signed)
 Palliative:  Face to face discussion with transition of care team related to patient condition, disposition.   Plan:  Continue to treat the treatable, anticipate short term rehab if wife unable to manage at home.   No charge  Lorenza Birkenhead, NP Palliative Medicine Team  Team phone [phone]

## 2023-11-15 NOTE — Progress Notes (Signed)
 PROGRESS NOTE    Bradley Sheppard  FMW:[id number]  DOB: 12-08-1940  DOA: 11/10/2023  PCP: Rosamond Leta NOVAK, MD   Brief Narrative:    Bradley Sheppard is a 83 y.o. 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  Patient has had some fever and tachycardia and was started on Levaquin, but not improving.  He has apparently been taking Levaquin over the last 4 days with no improvement and he was seen by nephrology office today with recommendations to be hospitalized for inpatient management.  Patient was admitted for management of left lower extremity cellulitis and is also noted to have AKI on CKD stage IIIb.  AKI slowly improving, but now noted to have worsening hemoglobin levels and stool occult pending.  Noted to have prior history of GI bleed.  Anticipate discharge once cellulitis further improves and creatinine level stabilized along with hemoglobin levels.   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 torsemide -holding today's dose due to elevated creatinine   Left lower extremity cellulitis in  the setting of venous insufficiency - Blood cultures -no growth to date - MRSA negative, discontinue vancomycin  - Continue Rocephin  IV - Leg elevation and TED hoses - Continue home oral torsemide  dosing - ABI unremarkable - Left lower extremity without DVT   AKI on CKD stage IIIb-stabilizing Lab Results  Component Value Date   CREATININE 2.71 (H) 11/15/2023   CREATININE 2.25 (H) 11/14/2023   CREATININE 2.56 (H) 11/13/2023    - Follows with Dr. Rachele nephrology outpatient - Appears to have some anasarca with low albumin  - Likely in the setting of some hypotension as well, plan to give some S/p Albumin  and monitor blood pressure readings - Elevate legs - Monitor carefully with home oral torsemide  dosing - Monitor strict I's and O's and consider nephrology evaluation by a.m.  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 Debera outpatient -  Coreg  held due to lower blood pressure readings and takes midodrine  for blood pressure support - Patient not a candidate for DCCV   CAD - History of DES to LAD and RCA in 2003 - Continue aspirin , and rosuvastatin , no Coreg  due to hypotension   MALT lymphoma - Follows with Dr. 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   Consultants:  PT/OT/TOC/palliative care team  Procedures:  None  Antimicrobials:  Anti-infectives (From admission, onward)    Start     Dose/Rate Route Frequency Ordered Stop   11/12/23 1600  vancomycin  (VANCOREADY) IVPB 1500 mg/300 mL  Status:  Discontinued        1,500 mg 150 mL/hr over 120 Minutes Intravenous Every 48 hours 11/10/23 1551 11/13/23 1042   11/12/23 0000  clindamycin  (CLEOCIN ) 300 MG capsule        300 mg Oral 3 times daily 11/12/23 1134 11/16/23 2359   11/11/23 1000  cefTRIAXone  (ROCEPHIN ) 2 g in sodium chloride  0.9 % 100 mL IVPB        2 g 200 mL/hr over 30 Minutes Intravenous Every 24 hours 11/10/23 1535     11/10/23 1600  vancomycin  (VANCOREADY) IVPB 2000 mg/400 mL        2,000 mg 200 mL/hr over 120 Minutes Intravenous  Once 11/10/23 1548 11/10/23 1848   11/10/23 1330  cefTRIAXone  (ROCEPHIN ) 2 g in sodium chloride  0.9 % 100 mL IVPB        2 g 200 mL/hr over 30 Minutes Intravenous  Once 11/10/23 1323 11/10/23 1427        Objective: Vitals:   11/14/23 1719 11/14/23 2054 11/15/23 0104 11/15/23 0454  BP: 117/87 135/70 111/72 102/63  Pulse: (!) 109 (!) 110 94 99  Resp:  18 16 18   Temp: 97.6 F (36.4 C) 97.8 F (36.6 C) 97.9 F (36.6 C) 97.7 F (36.5 C)  TempSrc: Oral Oral Oral Oral  SpO2: 97% 99% 94% 97%  Weight:      Height:        Intake/Output Summary (Last  24 hours) at 11/15/2023 1225 Last data filed at 11/15/2023 0455 Gross per 24 hour  Intake 240 ml  Output 1750 ml  Net -1510 ml   Filed Weights   11/10/23 1215 11/10/23 1600 11/14/23 0525  Weight: 97.5 kg 106 kg 103.6 kg        General:  AAO x 3,  cooperative, no distress;   HEENT:  Normocephalic, PERRL, otherwise with in Normal limits   Neuro:  CNII-XII intact. , normal motor and sensation, reflexes intact   Lungs:   Clear to auscultation BL, Respirations unlabored,  No wheezes / crackles  Cardio:    S1/S2, RRR, No murmure, No Rubs or Gallops   Abdomen:  Soft, non-tender, bowel sounds active all four quadrants, no guarding or peritoneal signs.  Muscular  skeletal:  Limited exam -severe global generalized weaknesses Bedbound/wheelchair-bound - in bed, able  to move all 4 extremities,   2+ pulses,  symmetric, +1 pitting edema  Skin:  Dry, warm to touch, negative for any Rashes,  Wounds: Please see nursing documentation         Significant improvement in lower extremity noted           Psychiatry: Flat affect.    Data Reviewed: I have personally reviewed following labs and imaging studies  CBC: Recent Labs  Lab 11/10/23 1238 11/11/23 0424 11/11/23 0919 11/13/23 0436 11/14/23 0432 11/15/23 0413  WBC 8.3 7.0  --  8.4 8.6 8.7  NEUTROABS 5.6  --   --   --   --   --   HGB 12.6* 9.9* 10.0* 11.0* 11.6* 11.1*  HCT 37.9* 31.1* 31.0* 33.2* 35.4* 33.8*  MCV 93.3 93.4  --  93.3 93.4 92.1  PLT 270 203  --  207 221 230   Basic Metabolic Panel: Recent Labs  Lab 11/11/23 0424 11/12/23 0418 11/13/23 0436 11/14/23 0432 11/15/23 0413  NA 139 137 137 134* 136  K 3.2* 4.0 3.8 3.3* 3.6  CL 103 99 99 97* 97*  CO2 26 25 26 26 28   GLUCOSE 68* 116* 142* 177* 194*  BUN 74* 73* 78* 76* 85*  CREATININE 2.53* 2.57*  2.55* 2.56* 2.25* 2.71*  CALCIUM  8.6* 9.2 8.8* 8.8* 8.5*  MG 2.0  --   --   --   --    GFR: Estimated Creatinine Clearance: 24.8 mL/min (A) (by C-G  formula based on SCr of 2.71 mg/dL (H)). Liver Function Tests: Recent Labs  Lab 11/10/23 1238 11/11/23 0424  AST 20 16  ALT 15 10  ALKPHOS 102 68  BILITOT 0.5 0.7  PROT 9.2* 7.6  ALBUMIN  2.9* 3.5   No results for input(s): LIPASE, AMYLASE in the last 168 hours. No results for input(s): AMMONIA in the last 168 hours. Coagulation Profile: Recent Labs  Lab 11/10/23 1238  INR 1.1    HbA1C: No results for input(s): HGBA1C in the last 72 hours.  CBG: Recent Labs  Lab 11/14/23 1145 11/14/23 1627 11/14/23 2126 11/15/23 0745 11/15/23 1112  GLUCAP 225* 238* 284* 202* 258*    Anemia Panel: No results for input(s): VITAMINB12, FOLATE, FERRITIN, TIBC, IRON , RETICCTPCT in the last 72 hours.  Sepsis Labs: Recent Labs  Lab 11/10/23 1238  LATICACIDVEN 1.7    Recent Results (from the past 240 hours)  Culture, blood (Routine x 2)     Status: None   Collection Time: 11/10/23 12:38 PM   Specimen: BLOOD LEFT ARM  Result Value Ref Range Status   Specimen Description BLOOD LEFT ARM  Final   Special Requests   Final    BOTTLES DRAWN AEROBIC AND ANAEROBIC Blood Culture adequate volume   Culture   Final    NO GROWTH 5 DAYS Performed at [facility], [address] [place], [place] [address]    Report Status 11/15/2023 FINAL  Final  Culture, blood (Routine x 2)     Status: None   Collection Time: 11/10/23 12:38 PM   Specimen: BLOOD RIGHT HAND  Result Value Ref Range Status   Specimen Description BLOOD RIGHT HAND  Final   Special Requests   Final    BOTTLES DRAWN AEROBIC AND ANAEROBIC Blood Culture adequate volume   Culture   Final    NO GROWTH 5 DAYS Performed at [facility], [address], [place], [place] [address]    Report Status 11/15/2023 FINAL  Final  MRSA  Next Gen by PCR, Nasal     Status: None   Collection Time: 11/10/23  4:00 PM   Specimen: Nasal Mucosa; Nasal Swab  Result Value Ref Range Status   MRSA by PCR Next Gen NOT DETECTED NOT  DETECTED Final    Comment: (NOTE) The GeneXpert MRSA Assay (FDA approved for NASAL specimens only), is one component of a comprehensive MRSA colonization surveillance program. It is not intended to diagnose MRSA infection nor to guide or monitor treatment for MRSA infections. Test performance is not FDA approved in patients less than 64 years old. Performed at [facility], [address], [place], [place] [address]         Scheduled Meds:  aspirin  EC  81 mg Oral Q breakfast   diclofenac  Sodium  2 g Topical QID   ferrous sulfate   325 mg Oral BID WC   insulin  aspart  0-5 Units Subcutaneous QHS   insulin  aspart  0-9 Units Subcutaneous TID WC   metolazone   2.5 mg Oral Once per day on Monday Thursday   midodrine   5 mg Oral TID WC   polyethylene glycol  17 g Oral Daily   rOPINIRole   0.25 mg Oral QHS   rosuvastatin   10 mg Oral Daily   senna-docusate  1 tablet Oral QHS   tamsulosin   0.4 mg Oral Daily   torsemide   20 mg Oral BID   Continuous Infusions:  cefTRIAXone  (ROCEPHIN )  IV 2 g (11/15/23 0903)     LOS: 4 days    Time spent: 55 minutes  SIGNED: Adriana DELENA Grams, MD, FHM. FAAFP [facility],  Pager (please use [email or web address] to page/text)  Please use Epic Secure Chat for non-urgent communication (7AM-7PM) If 7PM-7AM, please contact night-coverage [email or web address],  11/15/2023, 12:25 PM

## 2023-11-15 NOTE — Progress Notes (Signed)
 Mobility Specialist Progress Note:    11/15/23 1440  Mobility  Activity Stood at bedside  Level of Assistance Moderate assist, patient does 50-74%  Assistive Device Front wheel walker  Distance Ambulated (ft) 2 ft  Range of Motion/Exercises Active;All extremities  Activity Response Tolerated well  Mobility visit 1 Mobility  Mobility Specialist Start Time (ACUTE ONLY) 1404  Mobility Specialist Stop Time (ACUTE ONLY) 1440  Mobility Specialist Time Calculation (min) (ACUTE ONLY) 36 min   Pt received in bed, family in room. Agreeable to mobility. Required ModA to stand with RW. Pt only able to stand for 30 seconds- 1 minute d/t fatigue and weakness. Pt unable to ambulate, only able to perform 2-3 side steps at one time. Returned pt supine, alarm on. All needs met.   Sherrilee Ditty Mobility Specialist Please contact via Special educational needs teacher or  Rehab office at [phone]

## 2023-11-15 NOTE — [place] FL2 (Signed)
 [place]  MEDICAID FL2 LEVEL OF CARE FORM     IDENTIFICATION  Patient Name: Bradley Sheppard Birthdate: [birth year] Sex: male Admission Date (Current Location): 11/10/2023  [facility] and [place] Number:  [employer] and Address:  [facility],  [address] [place] [address]      Provider Number: [id number]  Attending Physician Name and Address:  Willette Adriana LABOR, MD  Relative Name and Phone Number:       Current Level of Care: Hospital Recommended Level of Care: Skilled Nursing Facility Prior Approval Number:    Date Approved/Denied:   PASRR Number: [id number]  Discharge Plan: SNF    Current Diagnoses: Patient Active Problem List   Diagnosis Date Noted   Cellulitis 11/10/2023   Elevated brain natriuretic peptide (BNP) level 09/05/2022   CAP (community acquired pneumonia) 09/05/2022   Prolonged QT interval 09/05/2022   Hypoalbuminemia due to protein-calorie malnutrition (HCC) 09/05/2022   History of CAD (coronary artery disease) 09/05/2022   DNR (do not resuscitate) 02/03/2022   Goals of care, counseling/discussion 01/31/2022   Catheter-associated urinary tract infection (HCC) 01/30/2022   Physical deconditioning 01/30/2022   Hyponatremia 01/28/2022   Sepsis due to gram-negative UTI (HCC) 01/28/2022   Paroxysmal atrial fibrillation (HCC) 01/28/2022   ILD (interstitial lung disease) (HCC) 01/20/2022   Acute exacerbation of  diastolic CHF (congestive heart failure) (HCC) 01/08/2022   Decubitus skin ulcer 01/08/2022   Urinary retention 01/02/2022   Mass in rectum 12/31/2021   Pressure injury of skin 11/29/2021   Chronic kidney disease, stage 3b (HCC) 11/26/2021   Acute on chronic diastolic CHF (congestive heart failure) (HCC)    SOB (shortness of breath) 11/23/2021   Acute heart failure with preserved ejection fraction (HFpEF) (HCC) 11/23/2021   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) 11/23/2021   Rectal mass 11/23/2021    Bilateral pleural effusion 11/23/2021   Fecal incontinence alternating with constipation 11/11/2021   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   Iron  deficiency anemia 07/28/2021   Peptic ulcer associated with Helicobacter pylori infection 03/27/2019   Acute on Chronic Anemia superimposed on chronic disease 03/27/2019   Mixed hyperlipidemia 04/03/2009   Essential hypertension 04/03/2009   CORONARY ATHEROSCLEROSIS NATIVE CORONARY ARTERY 04/03/2009   Moderate-Severe Aortic stenosis 02/18/2009    Orientation RESPIRATION BLADDER Height & Weight     Self, Time, Situation, Place  Normal External catheter Weight: 228 lb 6.3 oz (103.6 kg) Height:  6' (182.9 cm)  BEHAVIORAL SYMPTOMS/MOOD NEUROLOGICAL BOWEL NUTRITION STATUS      Incontinent Diet (See d/c summary)  AMBULATORY STATUS COMMUNICATION OF NEEDS Skin   Extensive Assist Verbally Skin abrasions, Other (Comment) (blister to lower back)                       Personal Care Assistance Level of Assistance  Bathing, Feeding, Dressing Bathing Assistance: Maximum assistance Feeding assistance: Maximum assistance Dressing Assistance: Maximum assistance     Functional Limitations Info  Sight, Hearing, Speech Sight Info: Impaired Hearing Info: Impaired Speech Info: Adequate    SPECIAL CARE FACTORS FREQUENCY  PT (By licensed PT)     PT Frequency: 5x weekly              Contractures      Additional Factors Info  Code Status, Allergies, Psychotropic Code Status Info: DNR- Limited Allergies Info: Nsaids, Prednisone Psychotropic Info: Ativan          Current Medications (11/15/2023):  This is the current hospital active medication list  Current Facility-Administered Medications  Medication Dose Route Frequency Provider Last Rate Last Admin   acetaminophen  (TYLENOL ) tablet 650 mg  650 mg Oral Q6H PRN Willette Jest A, MD   650 mg at 11/14/23 2217   Or   acetaminophen  (TYLENOL ) suppository 650 mg  650 mg Rectal Q6H PRN Shahmehdi, Jest  A, MD       aspirin  EC tablet 81 mg  81 mg Oral Q breakfast Shahmehdi, Seyed A, MD   81 mg at 11/15/23 9178   cefTRIAXone  (ROCEPHIN ) 2 g in sodium chloride  0.9 % 100 mL IVPB  2 g Intravenous Q24H Shahmehdi, Seyed A, MD 200 mL/hr at 11/15/23 0903 2 g at 11/15/23 9096   diclofenac  Sodium (VOLTAREN ) 1 % topical gel 2 g  2 g Topical QID Shahmehdi, Seyed A, MD   2 g at 11/15/23 1417   ferrous sulfate  tablet 325 mg  325 mg Oral BID WC Shahmehdi, Seyed A, MD   325 mg at 11/15/23 9178   insulin  aspart (novoLOG ) injection 0-5 Units  0-5 Units Subcutaneous QHS Shahmehdi, Seyed A, MD   3 Units at 11/14/23 2214   insulin  aspart (novoLOG ) injection 0-9 Units  0-9 Units Subcutaneous TID WC Shahmehdi, Seyed A, MD   5 Units at 11/15/23 1139   LORazepam  (ATIVAN ) tablet 0.5 mg  0.5 mg Oral QHS PRN Willette Jest A, MD   0.5 mg at 11/14/23 2216   metolazone  (ZAROXOLYN ) tablet 2.5 mg  2.5 mg Oral Once per day on Monday Thursday Shahmehdi, Seyed A, MD   2.5 mg at 11/15/23 9178   midodrine  (PROAMATINE ) tablet 5 mg  5 mg Oral TID WC Shahmehdi, Seyed A, MD   5 mg at 11/15/23 1139   Oral care mouth rinse  15 mL Mouth Rinse PRN Shahmehdi, Seyed A, MD       oxyCODONE  (Oxy IR/ROXICODONE ) immediate release tablet 2.5-5 mg  2.5-5 mg Oral Q6H PRN Shahmehdi, Seyed A, MD   5 mg at 11/14/23 2220   polyethylene glycol (MIRALAX  / GLYCOLAX ) packet 17 g  17 g Oral Daily Shahmehdi, Seyed A, MD   17 g at 11/15/23 9177   prochlorperazine  (COMPAZINE ) injection 10 mg  10 mg Intravenous Q6H PRN Shahmehdi, Seyed A, MD       rOPINIRole  (REQUIP ) tablet 0.25 mg  0.25 mg Oral QHS Shahmehdi, Seyed A, MD   0.25 mg at 11/14/23 2215   rosuvastatin  (CRESTOR ) tablet 10 mg  10 mg Oral Daily Shahmehdi, Seyed A, MD   10 mg at 11/15/23 9178   senna-docusate (Senokot-S) tablet 1 tablet  1 tablet Oral QHS Shahmehdi, Seyed A, MD       tamsulosin  (FLOMAX ) capsule 0.4 mg  0.4 mg Oral Daily Shahmehdi, Seyed A, MD   0.4 mg at 11/15/23 0822   [START ON  11/16/2023] torsemide  (DEMADEX ) tablet 20 mg  20 mg Oral BID Shahmehdi, Seyed A, MD         Discharge Medications: Please see discharge summary for a list of discharge medications.  Relevant Imaging Results:  Relevant Lab Results:   Additional Information SSN: [id number]  Mcarthur Saddie Kim, LCSW

## 2023-11-15 NOTE — TOC Progression Note (Signed)
 Transition of Care [facility]) - Progression Note    Patient Details  Name: Bradley Sheppard MRN: [id number] Date of Birth: [birth year]  [facility] CM/SW Contact  Mcarthur Saddie Kim, [place] Phone Number: 11/15/2023, 3:26 PM  Clinical Narrative: After extensive conversation throughout day, pt and family have agreed to SNF. They request [facility] or [place]. Reviewed Medicare.gov ratings. Referral sent and auth started. TOC will follow up in AM. RN and MD updated.       Expected Discharge Plan: Home w Home Health Services Barriers to Discharge: No SNF bed, Insurance Authorization  Expected Discharge Plan and Services In-house Referral: Clinical Social Work   Post Acute Care Choice: Durable Medical Equipment, Home Health Living arrangements for the past 2 months: Single Family Home Expected Discharge Date: 11/12/23               DME Arranged: Wheelchair manual DME Agency: [employer] Date DME Agency Contacted: 11/12/23 Time DME Agency Contacted: 1221 Representative spoke with at DME Agency: Darlyn HH Arranged: PT, OT, Nurse's Aide HH Agency: [facility] Date [facility] Agency Contacted: 11/12/23 Time HH Agency Contacted: 1221 Representative spoke with at [facility] Agency: [facility]le[facility]   Social Determinants of Health (SDOH) Interventions SDOH Screenings   Food Insecurity: No Food Insecurity (11/10/2023)  Housing: Low Risk  (11/10/2023)  Transportation Needs: No Transportation Needs (11/10/2023)  Utilities: Not At Risk (11/10/2023)  Financial Resource Strain: Low Risk  (03/27/2019)   Received from [facility]  Physical Activity: Inactive (03/27/2019)   [facility]  Social Connections: Moderately Isolated (11/10/2023)  Stress: No Stress Concern Present (03/27/2019)   Received from [facility]  Tobacco Use: Medium Risk (11/11/2023)  Health Literacy: Medium Risk (08/12/2020)   Received from [facility]    Readmission Risk Interventions    11/12/2023    12:15 PM 09/06/2022   10:32 AM 09/05/2022    5:41 PM  Readmission Risk Prevention Plan  Transportation Screening Complete Complete Complete  HRI or Home Care Consult Complete    Social Work Consult for Recovery Care Planning/Counseling Complete    Palliative Care Screening Not Applicable    Medication Review Oceanographer) Complete Complete Complete  PCP or Specialist appointment within 3-5 days of discharge  Complete Complete  HRI or Home Care Consult  Complete Complete  SW Recovery Care/Counseling Consult   Complete

## 2023-11-16 ENCOUNTER — Telehealth: Payer: Self-pay

## 2023-11-16 DIAGNOSIS — R5381 Other malaise: Secondary | ICD-10-CM | POA: Diagnosis not present

## 2023-11-16 DIAGNOSIS — L03116 Cellulitis of left lower limb: Secondary | ICD-10-CM | POA: Diagnosis not present

## 2023-11-16 LAB — CBC
HCT: 34.7 % — ABNORMAL LOW (ref 39.0–52.0)
Hemoglobin: 11.2 g/dL — ABNORMAL LOW (ref 13.0–17.0)
MCH: 29.7 pg (ref 26.0–34.0)
MCHC: 32.3 g/dL (ref 30.0–36.0)
MCV: 92 fL (ref 80.0–100.0)
Platelets: 230 K/uL (ref 150–400)
RBC: 3.77 MIL/uL — ABNORMAL LOW (ref 4.22–5.81)
RDW: 13.2 % (ref 11.5–15.5)
WBC: 8.9 K/uL (ref 4.0–10.5)
nRBC: 0 % (ref 0.0–0.2)

## 2023-11-16 LAB — BASIC METABOLIC PANEL WITH GFR
Anion gap: 17 — ABNORMAL HIGH (ref 5–15)
BUN: 85 mg/dL — ABNORMAL HIGH (ref 8–23)
CO2: 28 mmol/L (ref 22–32)
Calcium: 9 mg/dL (ref 8.9–10.3)
Chloride: 93 mmol/L — ABNORMAL LOW (ref 98–111)
Creatinine, Ser: 2.42 mg/dL — ABNORMAL HIGH (ref 0.61–1.24)
GFR, Estimated: 26 mL/min — ABNORMAL LOW (ref >60)
Glucose, Bld: 205 mg/dL — ABNORMAL HIGH (ref 70–99)
Potassium: 3.5 mmol/L (ref 3.5–5.1)
Sodium: 138 mmol/L (ref 135–145)

## 2023-11-16 LAB — GLUCOSE, CAPILLARY
Glucose-Capillary: 211 mg/dL — ABNORMAL HIGH (ref 70–99)
Glucose-Capillary: 274 mg/dL — ABNORMAL HIGH (ref 70–99)
Glucose-Capillary: 236 mg/dL — ABNORMAL HIGH (ref 70–99)
Glucose-Capillary: 326 mg/dL — ABNORMAL HIGH (ref 70–99)

## 2023-11-16 MED ORDER — FERROUS SULFATE 325 (65 FE) MG PO TABS: 325.0000 mg | ORAL_TABLET | Freq: Two times a day (BID) | ORAL | 3 refills | Status: AC

## 2023-11-16 MED ORDER — SENNOSIDES-DOCUSATE SODIUM 8.6-50 MG PO TABS: 1.0000 | ORAL_TABLET | Freq: Every day | ORAL | 0 refills | Status: AC

## 2023-11-16 MED ORDER — TORSEMIDE 20 MG PO TABS: 20.0000 mg | ORAL_TABLET | Freq: Every day | ORAL | 0 refills | Status: DC

## 2023-11-16 NOTE — Plan of Care (Signed)

## 2023-11-16 NOTE — Plan of Care (Signed)

## 2023-11-16 NOTE — Telephone Encounter (Signed)
 Called pt and wife to confirm if pt was still taking flomax  pt wife stated that pt is currently in hospital then going to a rehab facility pt stated he is till taking flomax  but is not sure if he will need a refill soon pt wife stated they will c/b and let us  know pt wife given personal extension to leave a message

## 2023-11-16 NOTE — Telephone Encounter (Signed)
-----   Message from [facility] Kourtney B sent at 11/15/2023  1:21 PM EDT ----- Please see Dr. FORBES refill request- disregard if complete

## 2023-11-16 NOTE — Discharge Summary (Addendum)
 Physician Discharge Summary   Patient: Bradley Sheppard MRN: [id number] DOB: 1941-02-25  Admit date:     11/10/2023  Discharge date: 11/16/23  Discharge Physician: Adriana DELENA Grams   PCP: Rosamond Leta NOVAK, MD   Medically stable for discharge-pending insurance approval to SNF   Recommendations at discharge:   Follow-up with PCP in 1-2 weeks BMP in 3-5 days, results to PCP, monitoring kidney function-May need referral to nephrologist Follow-up with cardiologist Complete course of current antibiotics (was changed to clindamycin )  Discharge Diagnoses: Principal Problem:   Cellulitis Active Problems:   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  Patient has had some fever and tachycardia and was started on Levaquin, but not improving.  He has apparently been taking Levaquin over the last 4 days with no improvement and he was seen by nephrology office today with recommendations to be hospitalized for inpatient management.  Patient was admitted for management of left lower extremity cellulitis and is also noted to have AKI on CKD stage IIIb.  AKI slowly improving, but now noted to have worsening hemoglobin levels and stool occult pending.  Noted to have prior history of GI bleed.  Anticipate discharge once cellulitis further improves and creatinine level stabilized along with hemoglobin levels.     Left lower extremity cellulitis in the setting of venous insufficiency - Blood cultures  ordered -no growth to date - MRSA negative, discontinue vancomycin  - Continue on vancomycin  discontinued, Rocephin  >>> finish 7 days of IV antibiotic treatment - Leg elevation and TED hoses - Holding torsemide  dosing-due to elevated creatinine - ABI unremarkable - Left lower extremity without DVT   AKI on CKD stage IIIb-improving - Follows with Dr. Rachele nephrology outpatient - Appears to have some anasarca with low albumin  - Likely in the setting of some hypotension as well, plan to give some albumin  for now and monitor blood pressure readings - Elevate legs - Monitor carefully with home oral torsemide  dosing    Anemia of chronic disease -Mild drop in hemoglobin but stable    Latest Ref Rng & Units 11/16/2023    4:49 AM 11/15/2023    4:13 AM 11/14/2023    4:32 AM  CBC  WBC 4.0 - 10.5 K/uL 8.9  8.7  8.6   Hemoglobin 13.0 - 17.0 g/dL 88.7  88.8  88.3   Hematocrit 39.0 - 52.0 % 34.7  33.8  35.4   Platelets 150 - 400 K/uL 230  230  221      Chronic HFpEF -No symptoms of dyspnea or orthopnea noted at this time, but volume overloaded -Chest x-ray with stable cardiomegaly - Echo on 09/2022 with EF 55-60% - Plan to continue home diuretics with use of albumin    Hypokalemia -Replete and reevaluate in a.m.   Type 2 diabetes - SSI and carb modified diet   PAF with RVR - Not on anticoagulation due to rectal bleeding and weakness predisposing to falls - Follows with Dr. Debera outpatient -  Coreg  held due to lower blood pressure readings and takes midodrine  for blood pressure support - Patient not a candidate for DCCV   CAD - History of DES to LAD and RCA in 2003 - Continue aspirin , and rosuvastatin , no Coreg  due to hypotension   MALT lymphoma - Follows with Dr. 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  Patient is cleared to be discharged to SNF, due to multiple, recent progressive decline recommending close follow-up with palliative care  Code Status: DNR  Disposition: Home Diet recommendation:  Discharge Diet Orders (From admission, onward)     Start     Ordered   11/12/23 0000  Diet - low sodium heart healthy        11/12/23 1134           Cardiac diet DISCHARGE MEDICATION: Allergies as of 11/16/2023       Reactions   Nsaids Other (See Comments)   On blood thinners with h/o IDA/bleeding   Prednisone Other (See Comments)   Makes him feel faint, hyperglycemic         Medication List     STOP taking these medications    levofloxacin 500 MG tablet Commonly known as: LEVAQUIN   potassium chloride  10 MEQ tablet Commonly known as: KLOR-CON  M       TAKE these medications    acetaminophen  500 MG tablet Commonly known as: TYLENOL  Take 1,000 mg by mouth every 6 (six) hours as needed for moderate pain.   ascorbic acid  500 MG tablet Commonly known as: VITAMIN C  Take 500 mg by mouth at bedtime.   aspirin  EC 81 MG tablet Take 1 tablet (81 mg total) by mouth daily with breakfast. Swallow whole.   Blood Pressure Cuff Misc 1 each by Does not apply route as directed. Dx: hypertension & heart failure   cyanocobalamin  500 MCG tablet Commonly known as: VITAMIN B12 Take 500 mcg by mouth daily.   ferrous sulfate  325 (65 FE) MG tablet Take 1 tablet (325 mg total) by mouth 2 (two) times daily with a meal.   FIBER-LAX PO Take 1 capsule by mouth daily.   glipiZIDE  10 MG 24 hr tablet Commonly known as: GLUCOTROL  XL Take 1 tablet (10 mg total) by mouth daily with breakfast. What changed: when to take this   insulin  aspart 100 UNIT/ML injection Commonly known as: novoLOG  Inject 8 Units into the skin at bedtime.   lactobacillus acidophilus & bulgar chewable tablet Chew 1 tablet by mouth 3 (three) times daily with meals for 10 days.    Lantus  SoloStar 100 UNIT/ML Solostar Pen Generic drug: insulin  glargine Inject 12 Units into the skin daily.   LORazepam  0.5 MG tablet Commonly known as: ATIVAN  Take 1 tablet (0.5 mg total) by mouth at bedtime as needed for anxiety or sleep.   metolazone  5 MG tablet Commonly known as: ZAROXOLYN  Take 2.5 mg by mouth 2 (two) times a week.   midodrine  5 MG tablet Commonly known as: PROAMATINE  TAKE 1 TABLET BY MOUTH THREE TIMES DAILY WITH MEALS. What changed: when to take this   nitroGLYCERIN  0.4 MG SL tablet Commonly known as: NITROSTAT  DISSOLVE 1 TABLET UNDER THE TONGUE EVERY 5 MINUTES AS NEEDED FOR CHEST PAIN. DO NOT EXCEED A TOTAL OF 3 DOSES IN 15 MINUTES.   oxyCODONE  5  MG immediate release tablet Commonly known as: Oxy IR/ROXICODONE  Take 0.5-1 tablets (2.5-5 mg total) by mouth every 6 (six) hours as needed for severe pain.   polyethylene glycol 17 g packet Commonly known as: MIRALAX  / GLYCOLAX  Take 17 g by mouth 2 (two) times daily as needed for mild constipation.   potassium chloride  10 MEQ tablet Commonly known as: KLOR-CON  Take 20 mEq by mouth daily as needed. Take with Metalozone   rOPINIRole  0.25 MG tablet Commonly known as: REQUIP  Take 0.25 mg by mouth at bedtime.   rosuvastatin  10 MG tablet Commonly known as: CRESTOR  1 (10 mg tablet) every Wednesday   senna-docusate 8.6-50 MG tablet Commonly known as: Senokot-S Take 1 tablet by mouth at bedtime.   tamsulosin  0.4 MG Caps capsule Commonly known as: Flomax  Take 1 capsule (0.4 mg total) by mouth daily.   torsemide  20 MG tablet Commonly known as: DEMADEX  Take 1 tablet (20 mg total) by mouth daily.               Durable Medical Equipment  (From admission, onward)           Start     Ordered   11/12/23 1119  For home use only DME oxygen  Once       Question Answer Comment  Length of Need 6 Months   Mode or (Route) Nasal cannula   Liters per Minute 2   Frequency Continuous (stationary and  portable oxygen unit needed)   Oxygen conserving device Yes   Oxygen delivery system Gas      11/12/23 1118            Contact information for follow-up providers     Care, [facility] Follow up.   Specialty: Home Health Services Why: Home health will call to schedule your next home visit. Contact information: [address] [place] [address] [phone]              Contact information for after-discharge care     Destination     [facility] .   Service: Skilled Nursing Contact information: [address] [place]  [address] [phone]                    Discharge Exam: Filed Weights   11/10/23 1215 11/10/23 1600 11/14/23 0525  Weight: 97.5 kg 106 kg 103.6 kg    General:  AAO x 3,  cooperative, no distress;   HEENT:  Normocephalic, PERRL, otherwise with in Normal limits   Neuro:  CNII-XII intact. , normal motor and sensation, reflexes intact   Lungs:   Clear to auscultation BL, Respirations unlabored,  No wheezes / crackles  Cardio:    S1/S2, RRR, No murmure, No Rubs or Gallops   Abdomen:  Soft, non-tender, bowel sounds active all four quadrants, no guarding or peritoneal signs.  Muscular  skeletal:  Limited exam -global generalized weaknesses - in bed, able to move all 4 extremities,   2+ pulses,  symmetric,lower extremity right erythema edema -please see picture  Skin:  Dry, warm to touch, negative for any Rashes,  Wounds: Please see nursing documentation           Condition at discharge: fair  The results of significant diagnostics from this hospitalization (including imaging, microbiology, ancillary and laboratory) are listed below for reference.   Imaging Studies: US  ARTERIAL ABI (SCREENING LOWER EXTREMITY) Result Date: 11/11/2023 CLINICAL DATA:  BILATERAL lower extremity swelling. Hypertension. Hyperlipidemia.  Diabetes. EXAM: NONINVASIVE PHYSIOLOGIC VASCULAR STUDY OF  BILATERAL LOWER EXTREMITIES TECHNIQUE: Evaluation of both lower extremities were performed at rest, including calculation of ankle-brachial indices with single level pressure measurements and doppler recording. COMPARISON:  None available. FINDINGS: Right ABI:  1.31 Left ABI:  1.26 Right Lower Extremity: Posterior tibial and dorsalis pedis artery waveforms are monophasic. Left Lower Extremity: Posterior tibial and dorsalis pedis artery waveforms are monophasic. 1.0-1.4 Normal IMPRESSION: Ankle brachial indices are within normal limits bilaterally. Monophasic waveforms at the ankle are likely artifact given to swelling. If there is continued clinical suspicion for peripheral arterial disease, further evaluation with CT angiography of the abdominal aorta with lower extremity runoff should be performed. Electronically Signed   By: Aliene Lloyd M.D.   On: 11/11/2023 11:07   US  Venous Img Lower Unilateral Left Result Date: 11/10/2023 CLINICAL DATA:  Left lower extremity swelling EXAM: LEFT LOWER EXTREMITY VENOUS DOPPLER ULTRASOUND TECHNIQUE: Gray-scale sonography with compression, as well as color and duplex ultrasound, were performed to evaluate the deep venous system(s) from the level of the common femoral vein through the popliteal and proximal calf veins. COMPARISON:  None Available. FINDINGS: VENOUS Normal compressibility of the common femoral, superficial femoral, and popliteal veins, as well as the visualized calf veins. Visualized portions of profunda femoral vein and great saphenous vein unremarkable. No filling defects to suggest DVT on grayscale or color Doppler imaging. Doppler waveforms show normal direction of venous flow, normal respiratory plasticity and response to augmentation. Limited views of the contralateral common femoral vein are unremarkable. OTHER Edema is present within the superficial soft tissues. Limitations: none IMPRESSION: 1. No evidence of left lower extremity DVT. Electronically  Signed   By: Maude Naegeli M.D.   On: 11/10/2023 14:35   DG Chest 2 View Result Date: 11/10/2023 CLINICAL DATA:  Concern for sepsis. EXAM: CHEST - 2 VIEW COMPARISON:  09/04/2022. FINDINGS: Stable cardiomegaly. Aortic atherosclerosis. 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 No focal consolidation, pleural effusion, or pneumothorax. Diffuse osseous demineralization. 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 IMPRESSION: 1. Stable cardiomegaly.  No acute cardiopulmonary findings. 2. 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 Electronically Signed   By: Harrietta Sherry M.D.   On: 11/10/2023 12:57    Microbiology: Results for orders placed or performed during the hospital encounter of 11/10/23  Culture, blood (Routine x 2)     Status: None   Collection Time: 11/10/23 12:38 PM   Specimen: BLOOD LEFT ARM  Result Value Ref Range Status   Specimen Description BLOOD LEFT ARM  Final   Special Requests   Final    BOTTLES DRAWN AEROBIC AND ANAEROBIC Blood Culture adequate volume   Culture   Final    NO GROWTH 5 DAYS Performed at [facility], [address], [place], [place] [address]    Report Status 11/15/2023 FINAL  Final  Culture, blood (Routine x 2)     Status: None   Collection Time: 11/10/23 12:38 PM   Specimen: BLOOD RIGHT HAND  Result Value Ref Range Status   Specimen Description BLOOD RIGHT HAND  Final   Special Requests   Final    BOTTLES DRAWN AEROBIC AND ANAEROBIC Blood Culture adequate volume   Culture   Final    NO GROWTH 5 DAYS Performed at [facility], [address], [place], [place] [address]    Report Status 11/15/2023 FINAL  Final  MRSA Next  Gen by PCR, Nasal     Status: None   Collection Time: 11/10/23  4:00 PM   Specimen: Nasal Mucosa; Nasal Swab  Result Value Ref Range Status   MRSA by PCR  Next Gen NOT DETECTED NOT DETECTED Final    Comment: (NOTE) The GeneXpert MRSA Assay (FDA approved for NASAL specimens only), is one component of a comprehensive MRSA colonization surveillance program. It is not intended to diagnose MRSA infection nor to guide or monitor treatment for MRSA infections. Test performance is not FDA approved in patients less than 40 years old. Performed at [facility] [address], [place], [place] [address]     Labs: CBC: Recent Labs  Lab 11/10/23 1238 11/11/23 0424 11/11/23 0919 11/13/23 0436 11/14/23 0432 11/15/23 0413 11/16/23 0449  WBC 8.3 7.0  --  8.4 8.6 8.7 8.9  NEUTROABS 5.6  --   --   --   --   --   --   HGB 12.6* 9.9* 10.0* 11.0* 11.6* 11.1* 11.2*  HCT 37.9* 31.1* 31.0* 33.2* 35.4* 33.8* 34.7*  MCV 93.3 93.4  --  93.3 93.4 92.1 92.0  PLT 270 203  --  207 221 230 230   Basic Metabolic Panel: Recent Labs  Lab 11/11/23 0424 11/12/23 0418 11/13/23 0436 11/14/23 0432 11/15/23 0413 11/16/23 0449  NA 139 137 137 134* 136 138  K 3.2* 4.0 3.8 3.3* 3.6 3.5  CL 103 99 99 97* 97* 93*  CO2 26 25 26 26 28 28   GLUCOSE 68* 116* 142* 177* 194* 205*  BUN 74* 73* 78* 76* 85* 85*  CREATININE 2.53* 2.57*  2.55* 2.56* 2.25* 2.71* 2.42*  CALCIUM  8.6* 9.2 8.8* 8.8* 8.5* 9.0  MG 2.0  --   --   --   --   --    Liver Function Tests: Recent Labs  Lab 11/10/23 1238 11/11/23 0424  AST 20 16  ALT 15 10  ALKPHOS 102 68  BILITOT 0.5 0.7  PROT 9.2* 7.6  ALBUMIN  2.9* 3.5   CBG: Recent Labs  Lab 11/15/23 1112 11/15/23 1631 11/15/23 1946 11/16/23 0718 11/16/23 1114  GLUCAP 258* 222* 263* 211* 274*    Discharge time spent: greater than 40 minutes.  Signed: Adriana DELENA Grams, MD [facility] 11/16/2023

## 2023-11-16 NOTE — TOC Progression Note (Addendum)
 Transition of Care [facility]) - Progression Note    Patient Details  Name: Bradley Sheppard MRN: [id number] Date of Birth: [birth year]  [facility]) CM/SW Contact  [place] Saddie Kim, [place] Phone Number: 11/16/2023, 9:20 AM  Clinical Narrative: Bed offer accepted at [facility]. Facility and insurance notified. Auth pending.     Update 3:30- Insurance has offered peer to peer. MD notified. They request updated PT evaluation. PT notified.   Expected Discharge Plan: Home w Home Health Services Barriers to Discharge: No SNF bed, Insurance Authorization  Expected Discharge Plan and Services In-house Referral: Clinical Social Work   Post Acute Care Choice: Durable Medical Equipment, Home Health Living arrangements for the past 2 months: Single Family Home Expected Discharge Date: 11/16/23               DME Arranged: Wheelchair manual DME Agency: [employer] Date DME Agency Contacted: 11/12/23 Time DME Agency Contacted: 1221 Representative spoke with at DME Agency: Darlyn HH Arranged: PT, OT, Nurse's Aide HH Agency: [facility] Date [facility] Agency Contacted: 11/12/23 Time HH Agency Contacted: 1221 Representative spoke with at [facility] Agency: Darleene   Social Determinants of Health (SDOH) Interventions SDOH Screenings   Food Insecurity: No Food Insecurity (11/10/2023)  Housing: Low Risk  (11/10/2023)  Transportation Needs: No Transportation Needs (11/10/2023)  Utilities: Not At Risk (11/10/2023)  Financial Resource Strain: Low Risk  (03/27/2019)   Received from [facility]  Physical Activity: Inactive (03/27/2019)   Received from [facility]  Social Connections: Moderately Isolated (11/10/2023)  Stress: No Stress Concern Present (03/27/2019)   Received from [facility]  Tobacco Use: Medium Risk (11/11/2023)  Health Literacy: Medium Risk (08/12/2020)   Received from [facility]    Readmission Risk Interventions    11/12/2023   12:15 PM 09/06/2022   10:32  AM 09/05/2022    5:41 PM  Readmission Risk Prevention Plan  Transportation Screening Complete Complete Complete  HRI or Home Care Consult Complete    Social Work Consult for Recovery Care Planning/Counseling Complete    Palliative Care Screening Not Applicable    Medication Review Oceanographer) Complete Complete Complete  PCP or Specialist appointment within 3-5 days of discharge  Complete Complete  HRI or Home Care Consult  Complete Complete  SW Recovery Care/Counseling Consult   Complete

## 2023-11-17 DIAGNOSIS — R5381 Other malaise: Secondary | ICD-10-CM | POA: Diagnosis not present

## 2023-11-17 DIAGNOSIS — L03116 Cellulitis of left lower limb: Secondary | ICD-10-CM | POA: Diagnosis not present

## 2023-11-17 LAB — GLUCOSE, CAPILLARY
Glucose-Capillary: 222 mg/dL — ABNORMAL HIGH (ref 70–99)
Glucose-Capillary: 274 mg/dL — ABNORMAL HIGH (ref 70–99)
Glucose-Capillary: 237 mg/dL — ABNORMAL HIGH (ref 70–99)
Glucose-Capillary: 306 mg/dL — ABNORMAL HIGH (ref 70–99)

## 2023-11-17 LAB — BASIC METABOLIC PANEL WITH GFR
Anion gap: 10 (ref 5–15)
BUN: 93 mg/dL — ABNORMAL HIGH (ref 8–23)
CO2: 30 mmol/L (ref 22–32)
Calcium: 8.7 mg/dL — ABNORMAL LOW (ref 8.9–10.3)
Chloride: 95 mmol/L — ABNORMAL LOW (ref 98–111)
Creatinine, Ser: 2.38 mg/dL — ABNORMAL HIGH (ref 0.61–1.24)
GFR, Estimated: 26 mL/min — ABNORMAL LOW (ref >60)
Glucose, Bld: 201 mg/dL — ABNORMAL HIGH (ref 70–99)
Potassium: 3.2 mmol/L — ABNORMAL LOW (ref 3.5–5.1)
Sodium: 135 mmol/L (ref 135–145)

## 2023-11-17 LAB — CBC
HCT: 34.6 % — ABNORMAL LOW (ref 39.0–52.0)
Hemoglobin: 11.8 g/dL — ABNORMAL LOW (ref 13.0–17.0)
MCH: 30.9 pg (ref 26.0–34.0)
MCHC: 34.1 g/dL (ref 30.0–36.0)
MCV: 90.6 fL (ref 80.0–100.0)
Platelets: 251 K/uL (ref 150–400)
RBC: 3.82 MIL/uL — ABNORMAL LOW (ref 4.22–5.81)
RDW: 13.2 % (ref 11.5–15.5)
WBC: 9.7 K/uL (ref 4.0–10.5)
nRBC: 0 % (ref 0.0–0.2)

## 2023-11-17 MED ORDER — TORSEMIDE 20 MG PO TABS
40.0000 mg | ORAL_TABLET | Freq: Every day | ORAL | Status: DC
  Administered 2023-11-18: 40 mg via ORAL
  Filled 2023-11-17: qty 2

## 2023-11-17 MED ORDER — POTASSIUM CHLORIDE CRYS ER 20 MEQ PO TBCR
40.0000 meq | EXTENDED_RELEASE_TABLET | Freq: Once | ORAL | Status: AC
  Administered 2023-11-17: 40 meq via ORAL
  Filled 2023-11-17: qty 2

## 2023-11-17 NOTE — TOC Progression Note (Signed)
 Transition of Care [facility]) - Progression Note    Patient Details  Name: Bradley Sheppard MRN: [id number] Date of Birth: [birth year]  Transition of Care [facility]) CM/SW Contact  [place] Saddie Kim, [place] Phone Number: 11/17/2023, 11:00 AM  Clinical Narrative:  Jori with [employer] notified that PT note from today is in chart for review. Awaiting decision following peer-to-peer completed yesterday and updated PT assessment. Pt's daughter updated.      Expected Discharge Plan: Home w Home Health Services Barriers to Discharge: No SNF bed, Insurance Authorization  Expected Discharge Plan and Services In-house Referral: Clinical Social Work   Post Acute Care Choice: Durable Medical Equipment, Home Health Living arrangements for the past 2 months: Single Family Home Expected Discharge Date: 11/16/23               DME Arranged: Wheelchair manual DME Agency: [employer] Date DME Agency Contacted: 11/12/23 Time DME Agency Contacted: 1221 Representative spoke with at DME Agency: Darlyn HH Arranged: PT, OT, Nurse's Aide HH Agency: [facility] Date [facility] Agency Contacted: 11/12/23 Time HH Agency Contacted: 1221 Representative spoke with at [facility] Agency: Darleene   Social Determinants of Health (SDOH) Interventions SDOH Screenings   Food Insecurity: No Food Insecurity (11/10/2023)  Housing: Low Risk  (11/10/2023)  Transportation Needs: No Transportation Needs (11/10/2023)  Utilities: Not At Risk (11/10/2023)  Financial Resource Strain: Low Risk  (03/27/2019)   Received from [facility]  Physical Activity: Inactive (03/27/2019)   Received from [facility]  Social Connections: Moderately Isolated (11/10/2023)  Stress: No Stress Concern Present (03/27/2019)   Received from [facility]  Tobacco Use: High Risk (11/16/2023)   Received from [facility]  Health Literacy: Medium Risk (08/12/2020)   Received from [facility]    Readmission Risk Interventions     11/12/2023   12:15 PM 09/06/2022   10:32 AM 09/05/2022    5:41 PM  Readmission Risk Prevention Plan  Transportation Screening Complete Complete Complete  HRI or Home Care Consult Complete    Social Work Consult for Recovery Care Planning/Counseling Complete    Palliative Care Screening Not Applicable    Medication Review Oceanographer) Complete Complete Complete  PCP or Specialist appointment within 3-5 days of discharge  Complete Complete  HRI or Home Care Consult  Complete Complete  SW Recovery Care/Counseling Consult   Complete

## 2023-11-17 NOTE — Discharge Summary (Deleted)
 Physician Discharge Summary   Patient: Bradley Sheppard MRN: [id number] DOB: 07-Sep-1940  Admit date:     11/10/2023  Discharge date: 11/17/23  Discharge Physician: Adriana DELENA Grams   PCP: Rosamond Leta NOVAK, MD   Medically stable for discharge-pending insurance approval to SNF   Recommendations at discharge:   Follow-up with PCP in 1-2 weeks BMP in 3-5 days, results to PCP, monitoring kidney function-May need referral to nephrologist Follow-up with cardiologist Complete course of current antibiotics (was changed to clindamycin )  Discharge Diagnoses: Principal Problem:   Cellulitis Active Problems:   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  Patient has had some fever and tachycardia and was started on Levaquin, but not improving.  He has apparently been taking Levaquin over the last 4 days with no improvement and he was seen by nephrology office today with recommendations to be hospitalized for inpatient management.  Patient was admitted for management of left lower extremity cellulitis and is also noted to have AKI on CKD stage IIIb.  AKI slowly improving, but now noted to have worsening hemoglobin levels and stool occult pending.  Noted to have prior history of GI bleed.  Anticipate discharge once cellulitis further improves and creatinine level stabilized along with hemoglobin levels.     Left lower extremity cellulitis in the setting of venous insufficiency - Blood cultures  ordered -no growth to date - MRSA negative, discontinue vancomycin  - Continue on vancomycin  discontinued, Rocephin  >>> finish 7 days of IV antibiotic treatment - Leg elevation and TED hoses - Holding torsemide  dosing-due to elevated creatinine - ABI unremarkable - Left lower extremity without DVT   AKI on CKD stage IIIb-improving - Follows with Dr. Rachele nephrology outpatient - Appears to have some anasarca with low albumin  - Likely in the setting of some hypotension as well, plan to give some albumin  for now and monitor blood pressure readings - Elevate legs - Monitor carefully with home oral torsemide  dosing    Anemia of chronic disease -Mild drop in hemoglobin but stable    Latest Ref Rng & Units 11/17/2023    4:45 AM 11/16/2023    4:49 AM 11/15/2023    4:13 AM  CBC  WBC 4.0 - 10.5 K/uL 9.7  8.9  8.7   Hemoglobin 13.0 - 17.0 g/dL 88.1  88.7  88.8   Hematocrit 39.0 - 52.0 % 34.6  34.7  33.8   Platelets 150 - 400 K/uL 251  230  230      Chronic HFpEF -No symptoms of dyspnea or orthopnea noted at this time, but volume overloaded -Chest x-ray with stable cardiomegaly - Echo on 09/2022 with EF 55-60% - Plan to continue home diuretics with use of albumin    Hypokalemia -Replete and reevaluate in a.m.   Type 2 diabetes - SSI and carb modified diet   PAF with RVR - Not on anticoagulation due to rectal bleeding and weakness predisposing to falls - Follows with Dr. Debera outpatient -  Coreg  held due to lower blood pressure readings and takes midodrine  for blood pressure support - Patient not a candidate for DCCV   CAD - History of DES to LAD and RCA in 2003 - Continue aspirin , and rosuvastatin , no Coreg  due to hypotension   MALT lymphoma - Follows with Dr. 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  Patient is cleared to be discharged to SNF, due to multiple, recent progressive decline recommending close follow-up with palliative care  Code Status: DNR  Disposition: Home Diet recommendation:  Discharge Diet Orders (From admission, onward)     Start     Ordered   11/12/23 0000  Diet - low sodium heart healthy        11/12/23 1134           Cardiac diet DISCHARGE MEDICATION: Allergies as of 11/17/2023       Reactions   Nsaids Other (See Comments)   On blood thinners with h/o IDA/bleeding   Prednisone Other (See Comments)   Makes him feel faint, hyperglycemic         Medication List     STOP taking these medications    levofloxacin 500 MG tablet Commonly known as: LEVAQUIN   potassium chloride  10 MEQ tablet Commonly known as: KLOR-CON  M       TAKE these medications    acetaminophen  500 MG tablet Commonly known as: TYLENOL  Take 1,000 mg by mouth every 6 (six) hours as needed for moderate pain.   ascorbic acid  500 MG tablet Commonly known as: VITAMIN C  Take 500 mg by mouth at bedtime.   aspirin  EC 81 MG tablet Take 1 tablet (81 mg total) by mouth daily with breakfast. Swallow whole.   Blood Pressure Cuff Misc 1 each by Does not apply route as directed. Dx: hypertension & heart failure   cyanocobalamin  500 MCG tablet Commonly known as: VITAMIN B12 Take 500 mcg by mouth daily.   ferrous sulfate  325 (65 FE) MG tablet Take 1 tablet (325 mg total) by mouth 2 (two) times daily with a meal.   FIBER-LAX PO Take 1 capsule by mouth daily.   glipiZIDE  10 MG 24 hr tablet Commonly known as: GLUCOTROL  XL Take 1 tablet (10 mg total) by mouth daily with breakfast. What changed: when to take this   insulin  aspart 100 UNIT/ML injection Commonly known as: novoLOG  Inject 8 Units into the skin at bedtime.   lactobacillus acidophilus & bulgar chewable tablet Chew 1 tablet by mouth 3 (three) times daily with meals for 10 days.    Lantus  SoloStar 100 UNIT/ML Solostar Pen Generic drug: insulin  glargine Inject 12 Units into the skin daily.   LORazepam  0.5 MG tablet Commonly known as: ATIVAN  Take 1 tablet (0.5 mg total) by mouth at bedtime as needed for anxiety or sleep.   metolazone  5 MG tablet Commonly known as: ZAROXOLYN  Take 2.5 mg by mouth 2 (two) times a week.   midodrine  5 MG tablet Commonly known as: PROAMATINE  TAKE 1 TABLET BY MOUTH THREE TIMES DAILY WITH MEALS. What changed: when to take this   nitroGLYCERIN  0.4 MG SL tablet Commonly known as: NITROSTAT  DISSOLVE 1 TABLET UNDER THE TONGUE EVERY 5 MINUTES AS NEEDED FOR CHEST PAIN. DO NOT EXCEED A TOTAL OF 3 DOSES IN 15 MINUTES.   oxyCODONE  5  MG immediate release tablet Commonly known as: Oxy IR/ROXICODONE  Take 0.5-1 tablets (2.5-5 mg total) by mouth every 6 (six) hours as needed for severe pain.   polyethylene glycol 17 g packet Commonly known as: MIRALAX  / GLYCOLAX  Take 17 g by mouth 2 (two) times daily as needed for mild constipation.   potassium chloride  10 MEQ tablet Commonly known as: KLOR-CON  Take 20 mEq by mouth daily as needed. Take with [employer]ozone   rOPINIRole  0.25 MG tablet Commonly known as: REQUIP  Take 0.25 mg by mouth at bedtime.   rosuvastatin  10 MG tablet Commonly known as: CRESTOR  1 (10 mg tablet) every Wednesday   senna-docusate 8.6-50 MG tablet Commonly known as: Senokot-S Take 1 tablet by mouth at bedtime.   tamsulosin  0.4 MG Caps capsule Commonly known as: Flomax  Take 1 capsule (0.4 mg total) by mouth daily.   torsemide  20 MG tablet Commonly known as: DEMADEX  Take 1 tablet (20 mg total) by mouth daily.               Durable Medical Equipment  (From admission, onward)           Start     Ordered   11/12/23 1119  For home use only DME oxygen  Once       Question Answer Comment  Length of Need 6 Months   Mode or (Route) Nasal cannula   Liters per Minute 2   Frequency Continuous (stationary and  portable oxygen unit needed)   Oxygen conserving device Yes   Oxygen delivery system Gas      11/12/23 1118            Contact information for follow-up providers     Care, [facility] Follow up.   Specialty: Home Health Services Why: Home health will call to schedule your next home visit. Contact information: [address] [place] [address] [phone]              Contact information for after-discharge care     Destination     [facility] .   Service: Skilled Nursing Contact information: [address] [facility] [place]  [address] [phone]                    Discharge Exam: [name]   11/10/23 1215 11/10/23 1600 11/14/23 0525  Weight: 97.5 kg 106 kg 103.6 kg    General:  AAO x 3,  cooperative, no distress;   HEENT:  Normocephalic, PERRL, otherwise with in Normal limits   Neuro:  CNII-XII intact. , normal motor and sensation, reflexes intact   Lungs:   Clear to auscultation BL, Respirations unlabored,  No wheezes / crackles  Cardio:    S1/S2, RRR, No murmure, No Rubs or Gallops   Abdomen:  Soft, non-tender, bowel sounds active all four quadrants, no guarding or peritoneal signs.  Muscular  skeletal:  Limited exam -global generalized weaknesses - in bed, able to move all 4 extremities,   2+ pulses,  symmetric,lower extremity right erythema edema -please see picture  Skin:  Dry, warm to touch, negative for any Rashes,  Wounds: Please see nursing documentation           Condition at discharge: fair  The results of significant diagnostics from this hospitalization (including imaging, microbiology, ancillary and laboratory) are listed below for reference.   Imaging Studies: US  ARTERIAL ABI (SCREENING LOWER EXTREMITY) Result Date: 11/11/2023 CLINICAL DATA:  BILATERAL lower extremity swelling. Hypertension. Hyperlipidemia.  Diabetes. EXAM: NONINVASIVE PHYSIOLOGIC VASCULAR STUDY OF  BILATERAL LOWER EXTREMITIES TECHNIQUE: Evaluation of both lower extremities were performed at rest, including calculation of ankle-brachial indices with single level pressure measurements and doppler recording. COMPARISON:  None available. FINDINGS: Right ABI:  1.31 Left ABI:  1.26 Right Lower Extremity: Posterior tibial and dorsalis pedis artery waveforms are monophasic. Left Lower Extremity: Posterior tibial and dorsalis pedis artery waveforms are monophasic. 1.0-1.4 Normal IMPRESSION: Ankle brachial indices are within normal limits bilaterally. Monophasic waveforms at the ankle are likely artifact given to swelling. If there is continued clinical suspicion for peripheral arterial disease, further evaluation with CT angiography of the abdominal aorta with lower extremity runoff should be performed. Electronically Signed   By: Aliene Lloyd M.D.   On: 11/11/2023 11:07   US  Venous Img Lower Unilateral Left Result Date: 11/10/2023 CLINICAL DATA:  Left lower extremity swelling EXAM: LEFT LOWER EXTREMITY VENOUS DOPPLER ULTRASOUND TECHNIQUE: Gray-scale sonography with compression, as well as color and duplex ultrasound, were performed to evaluate the deep venous system(s) from the level of the common femoral vein through the popliteal and proximal calf veins. COMPARISON:  None Available. FINDINGS: VENOUS Normal compressibility of the common femoral, superficial femoral, and popliteal veins, as well as the visualized calf veins. Visualized portions of profunda femoral vein and great saphenous vein unremarkable. No filling defects to suggest DVT on grayscale or color Doppler imaging. Doppler waveforms show normal direction of venous flow, normal respiratory plasticity and response to augmentation. Limited views of the contralateral common femoral vein are unremarkable. OTHER Edema is present within the superficial soft tissues. Limitations: none IMPRESSION: 1. No evidence of left lower extremity DVT. Electronically  Signed   By: Maude Naegeli M.D.   On: 11/10/2023 14:35   DG Chest 2 View Result Date: 11/10/2023 CLINICAL DATA:  Concern for sepsis. EXAM: CHEST - 2 VIEW COMPARISON:  09/04/2022. FINDINGS: Stable cardiomegaly. Aortic atherosclerosis. 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 No focal consolidation, pleural effusion, or pneumothorax. Diffuse osseous demineralization. 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 IMPRESSION: 1. Stable cardiomegaly.  No acute cardiopulmonary findings. 2. 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 Electronically Signed   By: Harrietta Sherry M.D.   On: 11/10/2023 12:57    Microbiology: Results for orders placed or performed during the hospital encounter of 11/10/23  Culture, blood (Routine x 2)     Status: None   Collection Time: 11/10/23 12:38 PM   Specimen: BLOOD LEFT ARM  Result Value Ref Range Status   Specimen Description BLOOD LEFT ARM  Final   Special Requests   Final    BOTTLES DRAWN AEROBIC AND ANAEROBIC Blood Culture adequate volume   Culture   Final    NO GROWTH 5 DAYS Performed at [facility], [address] [place], [place] [address]    Report Status 11/15/2023 FINAL  Final  Culture, blood (Routine x 2)     Status: None   Collection Time: 11/10/23 12:38 PM   Specimen: BLOOD RIGHT HAND  Result Value Ref Range Status   Specimen Description BLOOD RIGHT HAND  Final   Special Requests   Final    BOTTLES DRAWN AEROBIC AND ANAEROBIC Blood Culture adequate volume   Culture   Final    NO GROWTH 5 DAYS Performed at [facility], [address], [place], [place] [address]    Report Status 11/15/2023 FINAL  Final  MRSA Next  Gen by PCR, Nasal     Status: None   Collection Time: 11/10/23  4:00 PM   Specimen: Nasal Mucosa; Nasal Swab  Result Value Ref Range Status   MRSA by PCR  Next Gen NOT DETECTED NOT DETECTED Final    Comment: (NOTE) The GeneXpert MRSA Assay (FDA approved for NASAL specimens only), is one component of a comprehensive MRSA colonization surveillance program. It is not intended to diagnose MRSA infection nor to guide or monitor treatment for MRSA infections. Test performance is not FDA approved in patients less than 67 years old. Performed at [facility], [address] [place], [place] [address]     Labs: CBC: Recent Labs  Lab 11/10/23 1238 11/11/23 0424 11/13/23 0436 11/14/23 0432 11/15/23 0413 11/16/23 0449 11/17/23 0445  WBC 8.3   < > 8.4 8.6 8.7 8.9 9.7  NEUTROABS 5.6  --   --   --   --   --   --   HGB 12.6*   < > 11.0* 11.6* 11.1* 11.2* 11.8*  HCT 37.9*   < > 33.2* 35.4* 33.8* 34.7* 34.6*  MCV 93.3   < > 93.3 93.4 92.1 92.0 90.6  PLT 270   < > 207 221 230 230 251   < > = values in this interval not displayed.   Basic Metabolic Panel: Recent Labs  Lab 11/11/23 0424 11/12/23 0418 11/13/23 0436 11/14/23 0432 11/15/23 0413 11/16/23 0449 11/17/23 0445  NA 139   < > 137 134* 136 138 135  K 3.2*   < > 3.8 3.3* 3.6 3.5 3.2*  CL 103   < > 99 97* 97* 93* 95*  CO2 26   < > 26 26 28 28 30   GLUCOSE 68*   < > 142* 177* 194* 205* 201*  BUN 74*   < > 78* 76* 85* 85* 93*  CREATININE 2.53*   < > 2.56* 2.25* 2.71* 2.42* 2.38*  CALCIUM  8.6*   < > 8.8* 8.8* 8.5* 9.0 8.7*  MG 2.0  --   --   --   --   --   --    < > = values in this interval not displayed.   Liver Function Tests: Recent Labs  Lab 11/10/23 1238 11/11/23 0424  AST 20 16  ALT 15 10  ALKPHOS 102 68  BILITOT 0.5 0.7  PROT 9.2* 7.6  ALBUMIN  2.9* 3.5   CBG: Recent Labs  Lab 11/16/23 0718 11/16/23 1114 11/16/23 1709 11/16/23 1942 11/17/23 0727  GLUCAP 211* 274* 236* 326* 222*    Discharge time spent: greater than 40 minutes.  Signed: Adriana DELENA Grams, MD [facility] 11/17/2023

## 2023-11-17 NOTE — Plan of Care (Signed)
  Problem: Clinical Measurements: Goal: Ability to maintain clinical measurements within normal limits will improve Outcome: Adequate for Discharge Goal: Will remain free from infection Outcome: Adequate for Discharge Goal: Diagnostic test results will improve Outcome: Adequate for Discharge Goal: Cardiovascular complication will be avoided Outcome: Adequate for Discharge   Problem: Nutrition: Goal: Adequate nutrition will be maintained Outcome: Adequate for Discharge   Problem: Coping: Goal: Level of anxiety will decrease Outcome: Adequate for Discharge   Problem: Elimination: Goal: Will not experience complications related to urinary retention Outcome: Adequate for Discharge   Problem: Safety: Goal: Ability to remain free from injury will improve Outcome: Adequate for Discharge   Problem: Skin Integrity: Goal: Risk for impaired skin integrity will decrease Outcome: Adequate for Discharge   Problem: Fluid Volume: Goal: Ability to maintain a balanced intake and output will improve Outcome: Adequate for Discharge

## 2023-11-17 NOTE — Progress Notes (Addendum)
 Physical Therapy Treatment Patient Details Name: Bradley Sheppard MRN: [id number] DOB: Aug 08, 1940 Today's Date: 11/17/2023   History of Present Illness Bradley Sheppard is a 83 y.o. 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  Patient has had some fever and tachycardia and was started on Levaquin, but not improving.  He has apparently been taking Levaquin over the last 4 days with no improvement and he was seen by nephrology office today with recommendations to be hospitalized for inpatient management.  He is also noted to have worsening creatinine levels.  He has supposedly been taking his home diuretics and other medications as prescribed.    PT Comments  REASSESSMENT: Patient demonstrates slow labored movement for sitting up at bedside requiring use of bed rail and HOB raised due to weakness, once seated has baseline limited left knee flexion and ankle dorsiflexion due to quad and heel cord tightness, fair/good return for completing BLE exercises requiring verbal cueing/demonstration, very unsteady on feet and limited to a few steps at bedside before having to sit due to c/o fatigue and BLE weakness. Patient tolerated sitting up in chair after therapy. Patient will benefit from continued skilled physical therapy in hospital and recommended venue below to increase strength, balance, endurance for safe ADLs and gait.       If plan is discharge home, recommend the following: A lot of help with bathing/dressing/bathroom;A lot of help with walking and/or transfers;Help with stairs or ramp for entrance;Assistance with cooking/housework;Assist for transportation   Can travel by private vehicle     No  Equipment Recommendations  None recommended by PT    Recommendations for Other Services        Precautions / Restrictions Precautions Precautions: Fall Recall of Precautions/Restrictions: Intact Restrictions Weight Bearing Restrictions Per Provider Order: No     Mobility  Bed Mobility Overal bed mobility: Needs Assistance Bed Mobility: Supine to Sit     Supine to sit: Mod assist, HOB elevated, Used rails     General bed mobility comments: increased time, labored movement due to weakness with HOB partially raised    Transfers Overall transfer level: Needs assistance Equipment used: Rolling walker (2 wheels) Transfers: Sit to/from Stand, Bed to chair/wheelchair/BSC Sit to Stand: Mod assist   Step pivot transfers: Mod assist       General transfer comment: unsteady on feet with buckling of knees due to weakness    Ambulation/Gait Ambulation/Gait assistance: Mod assist, Max assist Gait Distance (Feet): 8 Feet Assistive device: Rolling walker (2 wheels) Gait Pattern/deviations: Decreased step length - right, Decreased step length - left, Decreased stride length, Trunk flexed Gait velocity: slow     General Gait Details: limited to a few side steps and a couple of steps forward/backwards due to fatigue and buckling of knees due to weakness   Stairs             Wheelchair Mobility     Tilt Bed    Modified Rankin (Stroke Patients Only)       Balance Overall balance assessment: Needs assistance Sitting-balance support: Feet supported, No upper extremity supported Sitting balance-Leahy Scale: Fair Sitting balance - Comments: fair/good seated at EOB   Standing balance support: Reliant on assistive device for balance, During functional activity, Bilateral upper extremity supported Standing balance-Leahy Scale:  Poor Standing balance comment: using RW                            Communication Communication Communication: No apparent difficulties  Cognition Arousal: Alert Behavior During Therapy: WFL for tasks  assessed/performed   PT - Cognitive impairments: No apparent impairments                         Following commands: Intact      Cueing    Exercises General Exercises - Lower Extremity Ankle Circles/Pumps: Seated, AROM, Strengthening, Both, 10 reps Long Arc Quad: Seated, AROM, Strengthening, Both, 10 reps Hip Flexion/Marching: Seated, AROM, Strengthening, Both, 10 reps    General Comments        Pertinent Vitals/Pain Pain Assessment Pain Assessment: Faces Faces Pain Scale: Hurts a little bit Pain Location: left knee with end  range flexion Pain Descriptors / Indicators: Discomfort, Grimacing Pain Intervention(s): Limited activity within patient's tolerance, Monitored during session, Repositioned    Home Living                          Prior Function            PT Goals (current goals can now be found in the care plan section) Acute Rehab PT Goals Patient Stated Goal: return home after rehab PT Goal Formulation: With patient Time For Goal Achievement: 11/27/23 Potential to Achieve Goals: Good Progress towards PT goals: Progressing toward goals    Frequency    Min 3X/week      PT Plan      Co-evaluation              AM-PAC PT 6 Clicks Mobility   Outcome Measure  Help needed turning from your back to your side while in a flat bed without using bedrails?: A Little Help needed moving from lying on your back to sitting on the side of a flat bed without using bedrails?: A Lot Help needed moving to and from a bed to a chair (including a wheelchair)?: A Lot Help needed standing up from a chair using your arms (e.g., wheelchair or bedside chair)?: A Lot Help needed to walk in hospital room?: A Lot Help needed climbing 3-5 steps with a railing? : Total 6 Click Score: 12    End of Session   Activity Tolerance: Patient tolerated treatment well Patient left: in chair;with call bell/phone within reach Nurse Communication: Mobility  status PT Visit Diagnosis: Unsteadiness on feet (R26.81);Other abnormalities of gait and mobility (R26.89);Muscle weakness (generalized) (M62.81)     Time: 0910-0930 PT Time Calculation (min) (ACUTE ONLY): 20 min  Charges:    $Therapeutic Exercise: 8-22 mins $Therapeutic Activity: 8-22 mins PT General Charges $$ ACUTE PT VISIT: 1 Visit                     10:56 AM, 11/17/23 Lynwood Music, MPT Physical Therapist with [facility] 336 [phone] office [phone] mobile phone

## 2023-11-17 NOTE — Plan of Care (Signed)
   Problem: Education: Goal: Knowledge of General Education information will improve Description: Including pain rating scale, medication(s)/side effects and non-pharmacologic comfort measures Outcome: Progressing   Problem: Clinical Measurements: Goal: Ability to maintain clinical measurements within normal limits will improve Outcome: Progressing Goal: Diagnostic test results will improve Outcome: Progressing

## 2023-11-17 NOTE — Progress Notes (Signed)
 Progress note   Patient: Bradley Sheppard MRN: [id number] DOB: 1940-09-22  Admit date:     11/10/2023  Discharge date: 11/17/23  Discharge Physician: Adriana DELENA Grams   PCP: Rosamond Leta NOVAK, MD   Medically stable for discharge-pending insurance approval to SNF  Patient was seen and examined, stable for discharge  At this point no approval from insurance for SNF discharge Following up with peer-to-peer review, reevaluation by PT  Possible discharge to SNF today or home with home health.    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  Patient has had some fever and tachycardia and was started on Levaquin, but not improving.  He has apparently been taking Levaquin over the last 4 days with no improvement and he was seen by nephrology office today with recommendations to be hospitalized for inpatient management.  Patient was admitted for management of left lower extremity cellulitis and is also noted to have AKI on CKD stage IIIb.  AKI slowly improving, but now noted to have worsening hemoglobin levels and stool occult pending.  Noted to have prior history of GI bleed.  Anticipate discharge once cellulitis further improves and creatinine level stabilized along with hemoglobin levels.     Left lower extremity cellulitis in the setting of venous insufficiency - Blood cultures ordered -no growth to date - MRSA  negative, discontinue vancomycin  - Continue on vancomycin  discontinued, Rocephin  >>> finish 7 days of IV antibiotic treatment - Leg elevation and TED hoses - Holding torsemide  dosing-due to elevated creatinine - ABI unremarkable - Left lower extremity without DVT   AKI on CKD stage IIIb-improving - Follows with Dr. Rachele nephrology outpatient - Appears to have some anasarca with low albumin  - Likely in the setting of some hypotension as well, plan to give some albumin  for now and monitor blood pressure readings - Elevate legs - Monitor carefully with home oral torsemide  dosing    Anemia of chronic disease -Mild drop in hemoglobin but stable    Latest Ref Rng & Units 11/17/2023    4:45 AM 11/16/2023    4:49 AM 11/15/2023    4:13 AM  CBC  WBC 4.0 - 10.5 K/uL 9.7  8.9  8.7   Hemoglobin 13.0 - 17.0 g/dL 88.1  88.7  88.8   Hematocrit 39.0 - 52.0 % 34.6  34.7  33.8   Platelets 150 - 400 K/uL 251  230  230      Chronic HFpEF -No symptoms of dyspnea or orthopnea noted at this time, but volume overloaded -Chest x-ray with stable cardiomegaly - Echo on 09/2022 with EF 55-60% - Plan to continue home diuretics with use of albumin    Hypokalemia -Replete and reevaluate in a.m.   Type 2 diabetes - SSI and carb modified diet   PAF with RVR - Not on anticoagulation due to rectal bleeding and weakness predisposing to falls - Follows with Dr. Debera  outpatient - Coreg  held due to lower blood pressure readings and takes midodrine  for blood pressure support - Patient not a candidate for DCCV   CAD - History of DES to LAD and RCA in 2003 - Continue aspirin , and rosuvastatin , no Coreg  due to hypotension   MALT lymphoma - Follows with Dr. 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  Patient is  cleared to be discharged to SNF, due to multiple, recent progressive decline recommending close follow-up with palliative care  Code Status: DNR  Disposition: Home Diet recommendation:  Discharge Diet Orders (From admission, onward)     Start     Ordered   11/12/23 0000  Diet - low sodium heart healthy        11/12/23 1134           Cardiac diet DISCHARGE MEDICATION: Allergies as of 11/17/2023       Reactions   Nsaids Other (See Comments)   On blood thinners with h/o IDA/bleeding   Prednisone Other (See Comments)   Makes him feel faint, hyperglycemic         Medication List     STOP taking these medications    levofloxacin 500 MG tablet Commonly known as: LEVAQUIN   potassium chloride  10 MEQ tablet Commonly known as: KLOR-CON  M       TAKE these medications    acetaminophen  500 MG tablet Commonly known as: TYLENOL  Take 1,000 mg by mouth every 6 (six) hours as needed for moderate pain.   ascorbic acid  500 MG tablet Commonly known as: VITAMIN C  Take 500 mg by mouth at bedtime.   aspirin  EC 81 MG tablet Take 1 tablet (81 mg total) by mouth daily with breakfast. Swallow whole.   Blood Pressure Cuff Misc 1 each by Does not apply route as directed. Dx: hypertension & heart failure   cyanocobalamin  500 MCG tablet Commonly known as: VITAMIN B12 Take 500 mcg by mouth daily.   ferrous sulfate  325 (65 FE) MG tablet Take 1 tablet (325 mg total) by mouth 2 (two) times daily with a meal.   FIBER-LAX PO Take 1 capsule by mouth daily.   glipiZIDE  10 MG 24 hr tablet Commonly known as: GLUCOTROL  XL Take 1 tablet (10 mg total) by mouth daily with breakfast. What changed: when to take this   insulin  aspart 100 UNIT/ML injection Commonly known as: novoLOG  Inject 8 Units into the skin at bedtime.   lactobacillus acidophilus & bulgar chewable tablet Chew 1 tablet by mouth 3 (three) times daily with meals for 10 days.   Lantus  SoloStar 100 UNIT/ML Solostar  Pen Generic drug: insulin  glargine Inject 12 Units into the skin daily.   LORazepam  0.5 MG tablet Commonly known as: ATIVAN  Take 1 tablet (0.5 mg total) by mouth at bedtime as needed for anxiety or sleep.   metolazone  5 MG tablet Commonly known as: ZAROXOLYN  Take 2.5 mg by mouth 2 (two) times a week.   midodrine  5 MG tablet Commonly known as: PROAMATINE  TAKE 1 TABLET BY MOUTH THREE TIMES DAILY WITH MEALS. What changed: when to take this   nitroGLYCERIN  0.4 MG SL tablet Commonly known as: NITROSTAT  DISSOLVE 1 TABLET UNDER THE TONGUE EVERY 5 MINUTES AS NEEDED FOR CHEST PAIN. DO NOT EXCEED A TOTAL OF 3 DOSES IN 15 MINUTES.  oxyCODONE  5 MG immediate release tablet Commonly known as: Oxy IR/ROXICODONE  Take 0.5-1 tablets (2.5-5 mg total) by mouth every 6 (six) hours as needed for severe pain.   polyethylene glycol 17 g packet Commonly known as: MIRALAX  / GLYCOLAX  Take 17 g by mouth 2 (two) times daily as needed for mild constipation.   potassium chloride  10 MEQ tablet Commonly known as: KLOR-CON  Take 20 mEq by mouth daily as needed. Take with Metalozone   rOPINIRole  0.25 MG tablet Commonly known as: REQUIP  Take 0.25 mg by mouth at bedtime.   rosuvastatin  10 MG tablet Commonly known as: CRESTOR  1 (10 mg tablet) every Wednesday   senna-docusate 8.6-50 MG tablet Commonly known as: Senokot-S Take 1 tablet by mouth at bedtime.   tamsulosin  0.4 MG Caps capsule Commonly known as: Flomax  Take 1 capsule (0.4 mg total) by mouth daily.   torsemide  20 MG tablet Commonly known as: DEMADEX  Take 1 tablet (20 mg total) by mouth daily.               Durable Medical Equipment  (From admission, onward)           Start     Ordered   11/12/23 1119  For home use only DME oxygen  Once       Question Answer Comment  Length of Need 6 Months   Mode or (Route) Nasal cannula   Liters per Minute 2   Frequency Continuous (stationary and portable oxygen unit needed)   Oxygen  conserving device Yes   Oxygen delivery system Gas      11/12/23 1118            Contact information for follow-up providers     [facility], [facility] Follow up.   Specialty: Home Health Services Why: Home health will call to schedule your next home visit. Contact information: [address] [place] [address] [phone]              Contact information for after-discharge care     Destination     [facility] .   Service: Skilled Nursing Contact information: [address] [place]  [address] [phone]                    Discharge Exam: [name]   11/10/23 1215 11/10/23 1600 11/14/23 0525  Weight: 97.5 kg 106 kg 103.6 kg         General:  AAO x 3,  cooperative, no distress;   HEENT:  Normocephalic, PERRL, otherwise with in Normal limits   Neuro:  CNII-XII intact. , normal motor and sensation, reflexes intact   Lungs:   Clear to auscultation BL, Respirations unlabored,  No wheezes / crackles  Cardio:    S1/S2, RRR, No murmure, No Rubs or Gallops   Abdomen:  Soft, non-tender, bowel sounds active all four quadrants, no guarding or peritoneal signs.  Muscular  skeletal:  Limited exam -sever global generalized weaknesses Can ambulate with 2 person assist - in bed, able to move all 4 extremities,   2+ pulses,  symmetric, No pitting edema  Skin:  Dry, warm to touch, negative for any Rashes,  Wounds: Please see nursing documentation                Condition at discharge: fair  The results of significant diagnostics from this hospitalization (including imaging, microbiology, ancillary and laboratory) are listed below for reference.   Imaging Studies: US  ARTERIAL ABI (  SCREENING LOWER EXTREMITY) Result Date: 11/11/2023 CLINICAL DATA:  BILATERAL lower extremity swelling. Hypertension. Hyperlipidemia. Diabetes. EXAM: NONINVASIVE PHYSIOLOGIC VASCULAR STUDY OF BILATERAL LOWER EXTREMITIES  TECHNIQUE: Evaluation of both lower extremities were performed at rest, including calculation of ankle-brachial indices with single level pressure measurements and doppler recording. COMPARISON:  None available. FINDINGS: Right ABI:  1.31 Left ABI:  1.26 Right Lower Extremity: Posterior tibial and dorsalis pedis artery waveforms are monophasic. Left Lower Extremity: Posterior tibial and dorsalis pedis artery waveforms are monophasic. 1.0-1.4 Normal IMPRESSION: Ankle brachial indices are within normal limits bilaterally. Monophasic waveforms at the ankle are likely artifact given to swelling. If there is continued clinical suspicion for peripheral arterial disease, further evaluation with CT angiography of the abdominal aorta with lower extremity runoff should be performed. Electronically Signed   By: Aliene Lloyd M.D.   On: 11/11/2023 11:07   US  Venous Img Lower Unilateral Left Result Date: 11/10/2023 CLINICAL DATA:  Left lower extremity swelling EXAM: LEFT LOWER EXTREMITY VENOUS DOPPLER ULTRASOUND TECHNIQUE: Gray-scale sonography with compression, as well as color and duplex ultrasound, were performed to evaluate the deep venous system(s) from the level of the common femoral vein through the popliteal and proximal calf veins. COMPARISON:  None Available. FINDINGS: VENOUS Normal compressibility of the common femoral, superficial femoral, and popliteal veins, as well as the visualized calf veins. Visualized portions of profunda femoral vein and great saphenous vein unremarkable. No filling defects to suggest DVT on grayscale or color Doppler imaging. Doppler waveforms show normal direction of venous flow, normal respiratory plasticity and response to augmentation. Limited views of the contralateral common femoral vein are unremarkable. OTHER Edema is present within the superficial soft tissues. Limitations: none IMPRESSION: 1. No evidence of left lower extremity DVT. Electronically Signed   By: Maude Naegeli M.D.    On: 11/10/2023 14:35   DG Chest 2 View Result Date: 11/10/2023 CLINICAL DATA:  Concern for sepsis. EXAM: CHEST - 2 VIEW COMPARISON:  09/04/2022. FINDINGS: Stable cardiomegaly. Aortic atherosclerosis. 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 No focal consolidation, pleural effusion, or pneumothorax. Diffuse osseous demineralization. 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 IMPRESSION: 1. Stable cardiomegaly.  No acute cardiopulmonary findings. 2. 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 Electronically Signed   By: Harrietta Sherry M.D.   On: 11/10/2023 12:57    Microbiology: Results for orders placed or performed during the hospital encounter of 11/10/23  Culture, blood (Routine x 2)     Status: None   Collection Time: 11/10/23 12:38 PM   Specimen: BLOOD LEFT ARM  Result Value Ref Range Status   Specimen Description BLOOD LEFT ARM  Final   Special Requests   Final    BOTTLES DRAWN AEROBIC AND ANAEROBIC Blood Culture adequate volume   Culture   Final    NO GROWTH 5 DAYS Performed at [facility], [address] [place], [place] [address]    Report Status 11/15/2023 FINAL  Final  Culture, blood (Routine x 2)     Status: None   Collection Time: 11/10/23 12:38 PM   Specimen: BLOOD RIGHT HAND  Result Value Ref Range Status   Specimen Description BLOOD RIGHT HAND  Final   Special Requests   Final    BOTTLES DRAWN AEROBIC AND ANAEROBIC Blood Culture adequate volume   Culture   Final    NO GROWTH 5 DAYS Performed at [facility], [address],  [place], [place] [address]    Report Status 11/15/2023 FINAL  Final  MRSA Next Gen by PCR, Nasal     Status: None   Collection Time: 11/10/23  4:00 PM   Specimen: Nasal Mucosa; Nasal Swab  Result Value Ref Range Status   MRSA by PCR Next Gen NOT DETECTED NOT  DETECTED Final    Comment: (NOTE) The GeneXpert MRSA Assay (FDA approved for NASAL specimens only), is one component of a comprehensive MRSA colonization surveillance program. It is not intended to diagnose MRSA infection nor to guide or monitor treatment for MRSA infections. Test performance is not FDA approved in patients less than 83 years old. Performed at [facility], [address] [place], [place] [address]     Labs: CBC: Recent Labs  Lab 11/10/23 1238 11/11/23 0424 11/13/23 0436 11/14/23 0432 11/15/23 0413 11/16/23 0449 11/17/23 0445  WBC 8.3   < > 8.4 8.6 8.7 8.9 9.7  NEUTROABS 5.6  --   --   --   --   --   --   HGB 12.6*   < > 11.0* 11.6* 11.1* 11.2* 11.8*  HCT 37.9*   < > 33.2* 35.4* 33.8* 34.7* 34.6*  MCV 93.3   < > 93.3 93.4 92.1 92.0 90.6  PLT 270   < > 207 221 230 230 251   < > = values in this interval not displayed.   Basic Metabolic Panel: Recent Labs  Lab 11/11/23 0424 11/12/23 0418 11/13/23 0436 11/14/23 0432 11/15/23 0413 11/16/23 0449 11/17/23 0445  NA 139   < > 137 134* 136 138 135  K 3.2*   < > 3.8 3.3* 3.6 3.5 3.2*  CL 103   < > 99 97* 97* 93* 95*  CO2 26   < > 26 26 28 28 30   GLUCOSE 68*   < > 142* 177* 194* 205* 201*  BUN 74*   < > 78* 76* 85* 85* 93*  CREATININE 2.53*   < > 2.56* 2.25* 2.71* 2.42* 2.38*  CALCIUM  8.6*   < > 8.8* 8.8* 8.5* 9.0 8.7*  MG 2.0  --   --   --   --   --   --    < > = values in this interval not displayed.   Liver Function Tests: Recent Labs  Lab 11/10/23 1238 11/11/23 0424  AST 20 16  ALT 15 10  ALKPHOS 102 68  BILITOT 0.5 0.7  PROT 9.2* 7.6  ALBUMIN  2.9* 3.5   CBG: Recent Labs  Lab 11/16/23 0718 11/16/23 1114 11/16/23 1709 11/16/23 1942 11/17/23 0727  GLUCAP 211* 274* 236* 326* 222*    Discharge time spent: greater than 40 minutes.  Signed: Adriana DELENA Grams, MD [facility] 11/17/2023

## 2023-11-17 NOTE — TOC Progression Note (Signed)
 Transition of Care [facility]) - Progression Note    Patient Details  Name: Bradley Sheppard MRN: [id number] Date of Birth: 19-Apr-1941  Transition of Care [facility]) CM/SW Contact  [place] Saddie Kim, [place] Phone Number: 11/17/2023, 3:33 PM  Clinical Narrative:  Per Jori with [facility], SNF shara has been denied. Pt's wife and daughter notified and plan to appeal. Will deliver denial letter to room when received with information to appeal. MD updated.      Expected Discharge Plan: Home w Home Health Services Barriers to Discharge: Insurance Authorization  Expected Discharge Plan and Services In-house Referral: Clinical Social Work   Post Acute Care Choice: Durable Medical Equipment, Home Health Living arrangements for the past 2 months: Single Family Home Expected Discharge Date: 11/16/23               DME Arranged: Wheelchair manual DME Agency: [employer] Date DME Agency Contacted: 11/12/23 Time DME Agency Contacted: 1221 Representative spoke with at DME Agency: Darlyn HH Arranged: PT, OT, Nurse's Aide HH Agency: [facility] Date [facility] Agency Contacted: 11/12/23 Time HH Agency Contacted: 1221 Representative spoke with at [facility] Agency: Darleene   Social Determinants of Health (SDOH) Interventions SDOH Screenings   Food Insecurity: No Food Insecurity (11/10/2023)  Housing: Low Risk  (11/10/2023)  Transportation Needs: No Transportation Needs (11/10/2023)  Utilities: Not At Risk (11/10/2023)  Financial Resource Strain: Low Risk  (03/27/2019)   Received from [facility]  Physical Activity: Inactive (03/27/2019)   Received from [facility]  Social Connections: Moderately Isolated (11/10/2023)  Stress: No Stress Concern Present (03/27/2019)   Received from [facility]  Tobacco Use: High Risk (11/16/2023)   Received from [facility]  Health Literacy: Medium Risk (08/12/2020)   Received from [facility]    Readmission Risk Interventions    11/12/2023   12:15 PM  09/06/2022   10:32 AM 09/05/2022    5:41 PM  Readmission Risk Prevention Plan  Transportation Screening Complete Complete Complete  HRI or Home Care Consult Complete    Social Work Consult for Recovery Care Planning/Counseling Complete    Palliative Care Screening Not Applicable    Medication Review Oceanographer) Complete Complete Complete  PCP or Specialist appointment within 3-5 days of discharge  Complete Complete  HRI or Home Care Consult  Complete Complete  SW Recovery Care/Counseling Consult   Complete

## 2023-11-18 DIAGNOSIS — L03116 Cellulitis of left lower limb: Secondary | ICD-10-CM | POA: Diagnosis not present

## 2023-11-18 LAB — GLUCOSE, CAPILLARY
Glucose-Capillary: 196 mg/dL — ABNORMAL HIGH (ref 70–99)
Glucose-Capillary: 221 mg/dL — ABNORMAL HIGH (ref 70–99)
Glucose-Capillary: 199 mg/dL — ABNORMAL HIGH (ref 70–99)
Glucose-Capillary: 387 mg/dL — ABNORMAL HIGH (ref 70–99)
Glucose-Capillary: 400 mg/dL — ABNORMAL HIGH (ref 70–99)

## 2023-11-18 MED ORDER — ROSUVASTATIN CALCIUM 10 MG PO TABS: 10.0000 mg | ORAL_TABLET | Freq: Every day | ORAL | Status: DC

## 2023-11-18 MED ORDER — TORSEMIDE 20 MG PO TABS
20.0000 mg | ORAL_TABLET | Freq: Every day | ORAL | Status: DC
  Administered 2023-11-19: 20 mg via ORAL
  Filled 2023-11-18: qty 1

## 2023-11-18 MED ORDER — INSULIN GLARGINE-YFGN 100 UNIT/ML ~~LOC~~ SOLN
10.0000 [IU] | SUBCUTANEOUS | Status: DC
  Administered 2023-11-18: 10 [IU] via SUBCUTANEOUS
  Filled 2023-11-18 (×3): qty 0.1

## 2023-11-18 NOTE — Progress Notes (Signed)
 Progress note   Patient: Bradley Sheppard MRN: [id number] DOB: 08-21-1940  Admit date:     11/10/2023  Discharge date: 11/18/23  Discharge Physician: Adriana DELENA Grams   PCP: Rosamond Leta NOVAK, MD   The patient was seen and examined this morning, stable complaining of mild lower extremity pain  Still complaining significant weakness - CBGs elevated 237, 306, 196 - Will titrate SSI  Possible discharge in the next 48-72 hours to home with home health- Insurance denied SNF. The patient and family has appealed!  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  Patient has had some fever and tachycardia and was started on Levaquin, but not improving.  He has apparently been taking Levaquin over the last 4 days with no improvement and he was seen by nephrology office today with recommendations to be hospitalized for inpatient management.  Patient was admitted for management of left lower extremity cellulitis and is also noted to have AKI on CKD stage IIIb.  AKI slowly improving, but now noted to have worsening hemoglobin levels and stool occult pending.  Noted to have prior history of GI bleed.  Anticipate discharge once cellulitis further improves and creatinine level stabilized along with hemoglobin levels.     Left lower extremity cellulitis in the setting of venous insufficiency - Blood cultures ordered -no growth to  date - MRSA negative, discontinue vancomycin  - Continue on vancomycin  discontinued, Rocephin  >>> finish 7 days of IV antibiotic treatment - Leg elevation and TED hoses - Holding torsemide  dosing-due to elevated creatinine - ABI unremarkable - Left lower extremity without DVT   AKI on CKD stage IIIb-improving - Follows with Dr. Rachele nephrology outpatient - POA: some anasarca with low albumin  - S/p albumin  replacement with IV Lasix -switch to p.o. torsemide  - Elevate legs     Anemia of chronic disease -with iron  deficiency -Mild drop in hemoglobin but stable - H&H stable, continue iron  supplements   Chronic HFpEF -No symptoms of dyspnea or orthopnea noted at this time, but volume overloaded -Chest x-ray with stable cardiomegaly - Echo on 09/2022 with EF 55-60% - Plan to continue home diuretics with use of albumin    Hypokalemia -resolved    Type 2 diabetes - SSI and carb modified diet   PAF with RVR - Not on anticoagulation due to rectal bleeding and weakness predisposing to falls - Follows with Dr. 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  Patient is cleared to be discharged to SNF, unfortunately denied by insurance - The patient family appealing  Progressive decline recommending close follow-up with palliative care  Code Status: DNR  Disposition: Home Diet recommendation:   Discharge Diet Orders (From admission, onward)     Start     Ordered   11/12/23 0000  Diet - low sodium heart healthy        11/12/23 1134           Cardiac diet DISCHARGE MEDICATION:    Contact information for follow-up providers     Care, [facility] Follow up.   Specialty: Home Health Services Why: Home health will call to schedule your next home visit. Contact information: [address] [place] [address] [phone]              Contact information for after-discharge care     Destination     [facility] .   Service: Skilled Nursing Contact information: [address] [place]  [address] [phone]                    Discharge Exam: [name]   11/10/23 1215 11/10/23 1600 11/14/23 0525  Weight: 97.5 kg 106 kg 103.6 kg           General:  AAO x 3,  cooperative, no distress;   HEENT:  Normocephalic, PERRL, otherwise with in Normal limits   Neuro:  CNII-XII intact. , normal motor and sensation, reflexes intact   Lungs:   Clear to auscultation BL, Respirations unlabored,  No wheezes / crackles  Cardio:    S1/S2, RRR, No murmure, No Rubs or Gallops   Abdomen:  Soft, non-tender, bowel sounds active all four quadrants, no guarding or peritoneal signs.  Muscular  skeletal:  Limited exam -global generalized weaknesses - in bed, able to move all 4 extremities,   2+ pulses,  symmetric, No pitting edema  Skin:  Dry, warm to touch, negative for any Rashes,  Wounds: Please see nursing documentation                   Condition at discharge: fair  The results of significant diagnostics from this hospitalization (including imaging, microbiology, ancillary and laboratory) are listed below for reference.   Imaging Studies: US  ARTERIAL ABI (SCREENING LOWER EXTREMITY) Result Date: 11/11/2023 CLINICAL DATA:  BILATERAL lower extremity swelling. Hypertension. Hyperlipidemia.  Diabetes. EXAM: NONINVASIVE PHYSIOLOGIC VASCULAR STUDY OF BILATERAL LOWER EXTREMITIES TECHNIQUE: Evaluation of both lower extremities were performed at rest, including calculation of ankle-brachial indices with single level pressure measurements and doppler recording. COMPARISON:  None available. FINDINGS: Right ABI:  1.31 Left ABI:  1.26 Right Lower Extremity: Posterior tibial and dorsalis pedis artery waveforms are monophasic. Left Lower Extremity: Posterior tibial and dorsalis pedis artery waveforms are monophasic. 1.0-1.4 Normal IMPRESSION: Ankle brachial indices are within normal limits bilaterally. Monophasic waveforms at the ankle are likely artifact given to swelling. If there is continued clinical suspicion for peripheral arterial disease, further evaluation with CT angiography of the abdominal aorta with lower extremity runoff should be performed. Electronically Signed   By: Aliene Lloyd M.D.   On: 11/11/2023 11:07   US  Venous Img Lower Unilateral Left Result Date: 11/10/2023 CLINICAL DATA:  Left lower extremity swelling EXAM: LEFT LOWER EXTREMITY VENOUS DOPPLER ULTRASOUND TECHNIQUE:  Gray-scale sonography with compression, as well as color and duplex ultrasound, were performed to evaluate the deep venous system(s) from the level of the common femoral vein through the popliteal and proximal calf veins. COMPARISON:  None Available. FINDINGS: VENOUS Normal compressibility of the common femoral, superficial femoral, and popliteal veins, as well as the visualized calf veins. Visualized portions of profunda femoral vein and great saphenous vein unremarkable. No filling defects to suggest DVT on grayscale or color Doppler imaging. Doppler waveforms show normal direction of venous flow, normal respiratory plasticity and response to augmentation. Limited views of the contralateral common femoral vein are unremarkable. OTHER Edema is present within the superficial soft tissues. Limitations: none IMPRESSION: 1. No  evidence of left lower extremity DVT. Electronically Signed   By: Maude Naegeli M.D.   On: 11/10/2023 14:35   DG Chest 2 View Result Date: 11/10/2023 CLINICAL DATA:  Concern for sepsis. EXAM: CHEST - 2 VIEW COMPARISON:  09/04/2022. FINDINGS: Stable cardiomegaly. Aortic atherosclerosis. 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 No focal consolidation, pleural effusion, or pneumothorax. Diffuse osseous demineralization. 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 IMPRESSION: 1. Stable cardiomegaly.  No acute cardiopulmonary findings. 2. 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 Electronically Signed   By: Harrietta Sherry M.D.   On: 11/10/2023 12:57    Microbiology: Results for orders placed or performed during the hospital encounter of 11/10/23  Culture, blood (Routine x 2)     Status: None   Collection Time: 11/10/23 12:38 PM   Specimen: BLOOD LEFT ARM  Result Value Ref Range Status   Specimen Description BLOOD LEFT ARM  Final   Special Requests   Final    BOTTLES DRAWN AEROBIC AND ANAEROBIC Blood Culture adequate volume   Culture   Final    NO GROWTH 5 DAYS Performed at [facility], [address], [place], [place] [address]    Report Status 11/15/2023 FINAL  Final  Culture, blood (Routine x 2)     Status: None   Collection Time: 11/10/23 12:38 PM   Specimen: BLOOD RIGHT HAND  Result Value Ref Range Status   Specimen Description BLOOD RIGHT HAND  Final   Special Requests   Final    BOTTLES DRAWN AEROBIC AND ANAEROBIC Blood Culture adequate volume   Culture   Final    NO GROWTH 5 DAYS Performed at [facility], [address], [place], [place] [address]    Report Status 11/15/2023 FINAL  Final  MRSA Next Gen by PCR, Nasal     Status: None   Collection Time: 11/10/23  4:00 PM   Specimen: Nasal Mucosa; Nasal  Swab  Result Value Ref Range Status   MRSA by PCR Next Gen NOT DETECTED NOT DETECTED Final    Comment: (NOTE) The GeneXpert MRSA Assay (FDA approved for NASAL specimens only), is one component of a comprehensive MRSA colonization surveillance program. It is not intended to diagnose MRSA infection nor to guide or monitor treatment for MRSA infections. Test performance is not FDA approved in patients less than 7 years old. Performed at [facility], [address] [place], [place] [address]     Labs: CBC: Recent Labs  Lab 11/13/23 0436 11/14/23 0432 11/15/23 0413 11/16/23 0449 11/17/23 0445  WBC 8.4 8.6 8.7 8.9 9.7  HGB 11.0* 11.6* 11.1* 11.2* 11.8*  HCT 33.2* 35.4* 33.8* 34.7* 34.6*  MCV 93.3 93.4 92.1 92.0 90.6  PLT 207 221 230 230 251   Basic Metabolic Panel: Recent Labs  Lab 11/13/23 0436 11/14/23 0432 11/15/23 0413 11/16/23 0449 11/17/23 0445  NA 137 134* 136 138 135  K 3.8 3.3* 3.6 3.5 3.2*  CL 99 97* 97* 93* 95*  CO2 26 26 28 28 30   GLUCOSE 142* 177* 194* 205* 201*  BUN 78* 76* 85* 85* 93*  CREATININE 2.56* 2.25* 2.71* 2.42* 2.38*  CALCIUM  8.8* 8.8* 8.5* 9.0 8.7*   Liver Function Tests: No results for input(s): AST, ALT, ALKPHOS, BILITOT, PROT, ALBUMIN  in the last 168 hours.  CBG: Recent Labs  Lab 11/17/23 0727 11/17/23 1107 11/17/23 1735 11/17/23 2010 11/18/23 0738  GLUCAP 222* 274* 237* 306* 196*    Discharge time spent: greater than 40 minutes.  Signed: Adriana DELENA Grams, MD [facility] 11/18/2023

## 2023-11-18 NOTE — TOC Progression Note (Signed)
 Transition of Care [facility]) - Progression Note    Patient Details  Name: Bradley Sheppard MRN: [id number] Date of Birth: [birth year]  Transition [facility]) CM/SW Contact  Noreen KATHEE [place], [place] Phone Number: 11/18/2023, 12:45 PM  Clinical Narrative:     CSW reached out to to Health team [employer] to check in on patient appeal. Tammy shared that they normally get notified after the family is updated so CSW would need to reach out to family on decision, but on her end a decision has not been determined yet. TOC to follow.  Expected Discharge Plan: Home w Home Health Services Barriers to Discharge: Other (must enter comment) Investment banker, corporate - No decision made)  Expected Discharge Plan and Services In-house Referral: Clinical Social Work   Post Acute Care Choice: Horticulturist, commercial, Home Health Living arrangements for the past 2 months: Single Family Home Expected Discharge Date: 11/16/23               DME Arranged: Wheelchair manual DME Agency: [employer] Date DME Agency Contacted: 11/12/23 Time DME Agency Contacted: 1221 Representative spoke with at DME Agency: Darlyn HH Arranged: PT, OT, Nurse's Aide HH Agency: [facility] Date [facility] Agency Contacted: 11/12/23 Time HH Agency Contacted: 1221 Representative spoke with at [facility] Agency: Darleene   Social Determinants of Health (SDOH) Interventions SDOH Screenings   Food Insecurity: No Food Insecurity (11/10/2023)  Housing: Low Risk  (11/10/2023)  Transportation Needs: No Transportation Needs (11/10/2023)  Utilities: Not At Risk (11/10/2023)  Financial Resource Strain: Low Risk  (03/27/2019)   Received from [facility]  Physical Activity: Inactive (03/27/2019)   Received from [facility]  Social Connections: Moderately Isolated (11/10/2023)  Stress: No Stress Concern Present (03/27/2019)   Received from [facility]  Tobacco Use: High Risk (11/16/2023)   Received from [facility]  Health Literacy: Medium  Risk (08/12/2020)   [facility]    Readmission Risk Interventions    11/18/2023   12:44 PM 11/12/2023   12:15 PM 09/06/2022   10:32 AM  Readmission Risk Prevention Plan  Transportation Screening Complete Complete Complete  HRI or Home Care Consult Complete Complete   Social Work Consult for Recovery Care Planning/Counseling Complete Complete   Palliative Care Screening Not Applicable Not Applicable   Medication Review Oceanographer) Complete Complete Complete  PCP or Specialist appointment within 3-5 days of discharge   Complete  HRI or Home Care Consult   Complete

## 2023-11-18 NOTE — Plan of Care (Signed)
   Problem: Education: Goal: Knowledge of General Education information will improve Description: Including pain rating scale, medication(s)/side effects and non-pharmacologic comfort measures Outcome: Progressing   Problem: Clinical Measurements: Goal: Ability to maintain clinical measurements within normal limits will improve Outcome: Progressing Goal: Diagnostic test results will improve Outcome: Progressing

## 2023-11-18 NOTE — Plan of Care (Signed)
  Problem: Education: Goal: Knowledge of General Education information will improve Description: Including pain rating scale, medication(s)/side effects and non-pharmacologic comfort measures Outcome: Progressing   Problem: Health Behavior/Discharge Planning: Goal: Ability to manage health-related needs will improve Outcome: Progressing   Problem: Clinical Measurements: Goal: Ability to maintain clinical measurements within normal limits will improve Outcome: Progressing Goal: Will remain free from infection Outcome: Progressing Goal: Diagnostic test results will improve Outcome: Progressing Goal: Respiratory complications will improve Outcome: Progressing   Problem: Activity: Goal: Risk for activity intolerance will decrease Outcome: Progressing   Problem: Skin Integrity: Goal: Risk for impaired skin integrity will decrease Outcome: Progressing   Problem: Fluid Volume: Goal: Ability to maintain a balanced intake and output will improve Outcome: Progressing   Problem: Nutrition: Goal: Adequate nutrition will be maintained Outcome: Adequate for Discharge   Problem: Coping: Goal: Level of anxiety will decrease Outcome: Adequate for Discharge   Problem: Elimination: Goal: Will not experience complications related to urinary retention Outcome: Adequate for Discharge   Problem: Pain Managment: Goal: General experience of comfort will improve and/or be controlled Outcome: Adequate for Discharge   Problem: Safety: Goal: Ability to remain free from injury will improve Outcome: Adequate for Discharge

## 2023-11-19 DIAGNOSIS — R531 Weakness: Secondary | ICD-10-CM | POA: Diagnosis not present

## 2023-11-19 DIAGNOSIS — L03116 Cellulitis of left lower limb: Secondary | ICD-10-CM | POA: Diagnosis not present

## 2023-11-19 LAB — BASIC METABOLIC PANEL WITH GFR
Anion gap: 13 (ref 5–15)
BUN: 101 mg/dL — ABNORMAL HIGH (ref 8–23)
CO2: 29 mmol/L (ref 22–32)
Calcium: 8.6 mg/dL — ABNORMAL LOW (ref 8.9–10.3)
Chloride: 90 mmol/L — ABNORMAL LOW (ref 98–111)
Creatinine, Ser: 2.91 mg/dL — ABNORMAL HIGH (ref 0.61–1.24)
GFR, Estimated: 20 mL/min — ABNORMAL LOW (ref >60)
Glucose, Bld: 263 mg/dL — ABNORMAL HIGH (ref 70–99)
Potassium: 3.7 mmol/L (ref 3.5–5.1)
Sodium: 132 mmol/L — ABNORMAL LOW (ref 135–145)

## 2023-11-19 LAB — GLUCOSE, CAPILLARY
Glucose-Capillary: 264 mg/dL — ABNORMAL HIGH (ref 70–99)
Glucose-Capillary: 284 mg/dL — ABNORMAL HIGH (ref 70–99)
Glucose-Capillary: 268 mg/dL — ABNORMAL HIGH (ref 70–99)

## 2023-11-19 LAB — BRAIN NATRIURETIC PEPTIDE: B Natriuretic Peptide: 215 pg/mL — ABNORMAL HIGH (ref 0.0–100.0)

## 2023-11-19 MED ORDER — TORSEMIDE 20 MG PO TABS: 20.0000 mg | ORAL_TABLET | Freq: Every day | ORAL | Status: DC

## 2023-11-19 MED ORDER — INSULIN GLARGINE-YFGN 100 UNIT/ML ~~LOC~~ SOLN
12.0000 [IU] | SUBCUTANEOUS | Status: DC
  Administered 2023-11-19: 12 [IU] via SUBCUTANEOUS
  Filled 2023-11-19 (×2): qty 0.12

## 2023-11-19 NOTE — TOC Progression Note (Signed)
 Transition of Care [facility]) - Progression Note    Patient Details  Name: Bradley Sheppard MRN: [id number] Date of Birth: August 19, 1940  Transition [facility] Care [facility]) CM/SW Contact  Noreen KATHEE [place], [place] Phone Number: 11/19/2023, 11:07 AM  Clinical Narrative:     CSW spoke with spouse who shared that the appeal decision came back and they agreed with the denial for SNF. Spouse shared that the insurance, helped assist with second appeal through [employer] . Spouse asked CSW what they should do , and CSW reiterated that patient is medically stable and has HH already pending to resume services once he is home but cannot tell them what to do. Spouse was receiving a call from Health team . TOC to follow.  Daughter call CSW ans stated that the second appeal has been made and that the insurance will cover patient through Sunday. CSW did inform family that they would need to follow up with us  about decision since insurance will call them back abut second appeal. Also , shared that while decision is pending , they would need to be prepared for next steps of DC , which is going home with [facility] and getting wheelchair delivered to home as planned prior. Family understood and had no additional questions. TOC to follow   Expected Discharge Plan: Home w Home Health Services Barriers to Discharge: Other (must enter comment) (Family did a second appeal - pending decision - Appeal was done through [facility])  Expected Discharge Plan and Services In-house Referral: Clinical Social Work   Post Acute Care Choice: Horticulturist, commercial, Home Health Living arrangements for the past 2 months: Single Family Home Expected Discharge Date: 11/16/23               DME Arranged: Wheelchair manual DME Agency: [employer] Date DME Agency Contacted: 11/12/23 Time DME Agency Contacted: 1221 Representative spoke with at DME Agency: Darlyn HH Arranged: PT, OT, Nurse's Aide HH Agency: [facility] Date [facility] Agency Contacted:  11/12/23 Time HH Agency Contacted: 1221 Representative spoke with at [facility] Agency: Darleene   Social Determinants of Health (SDOH) Interventions SDOH Screenings   Food Insecurity: No Food Insecurity (11/10/2023)  Housing: Low Risk  (11/10/2023)  Transportation Needs: No Transportation Needs (11/10/2023)  Utilities: Not At Risk (11/10/2023)  Financial Resource Strain: Low Risk  (03/27/2019)   Received from [facility]  Physical Activity: Inactive (03/27/2019)   Received from [facility]  Social Connections: Moderately Isolated (11/10/2023)  Stress: No Stress Concern Present (03/27/2019)   Received from [facility]  Tobacco Use: High Risk (11/16/2023)   Received from [facility]  Health Literacy: Medium Risk (08/12/2020)   Received from [facility]    Readmission Risk Interventions    11/19/2023   11:05 AM 11/18/2023   12:44 PM 11/12/2023   12:15 PM  Readmission Risk Prevention Plan  Transportation Screening Complete Complete Complete  HRI or Home Care Consult Complete Complete Complete  Social Work Consult for Recovery Care Planning/Counseling Complete Complete Complete  Palliative Care Screening Not Applicable Not Applicable Not Applicable  Medication Review Oceanographer) Complete Complete Complete

## 2023-11-19 NOTE — Discharge Summary (Signed)
 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  Patient has had some fever and tachycardia and was started on Levaquin, but not improving.  He has apparently been taking Levaquin over the last 4 days with no improvement and he was seen by nephrology office today with recommendations to be hospitalized for inpatient management.  Patient was admitted for management of left lower extremity cellulitis and is also noted to have AKI on CKD stage IIIb.  AKI slowly improving, but now noted to have worsening hemoglobin levels and stool occult pending.  Noted to have prior history of GI bleed.  Anticipate discharge once cellulitis further improves and creatinine level stabilized along with hemoglobin levels.     Left lower extremity cellulitis in the setting of venous insufficiency - Blood cultures ordered -no growth to date - MRSA negative, discontinue vancomycin  - Continue on vancomycin  discontinued, Rocephin  >>> finish 7  days of IV antibiotic treatment - Leg elevation and TED hoses - Holding torsemide  dosing-due to elevated creatinine - ABI unremarkable - Left lower extremity without DVT   AKI on CKD stage IIIb-improving - Follows with Dr. Rachele nephrology outpatient - POA: some anasarca with low albumin  - S/p albumin  replacement with IV Lasix -switch to p.o. torsemide  - Elevate legs     Anemia of chronic disease -with iron  deficiency -Mild drop in hemoglobin but stable - H&H stable, continue iron  supplements   Chronic HFpEF -No symptoms of dyspnea or orthopnea noted at this time, but volume overloaded -Chest x-ray with stable cardiomegaly - Echo on 09/2022 with EF 55-60% - Plan to continue home diuretics with use of albumin    Hypokalemia -resolved  Type 2 diabetes - SSI and carb modified diet   PAF with RVR - Not on anticoagulation due to rectal bleeding and weakness predisposing to falls - Follows with Dr. 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  Patient is cleared to be discharged to SNF, unfortunately denied by insurance - The patient family appealing  Progressive decline recommending close follow-up with palliative care  Code Status: DNR  Disposition: Home Diet recommendation:  Discharge Diet Orders (From admission, onward)     Start     Ordered   11/12/23 0000  Diet - low sodium heart  healthy        11/12/23 1134           Cardiac diet DISCHARGE MEDICATION:    Contact information for follow-up providers     Care, [facility] Follow up.   Specialty: Home Health Services Why: Home health will call to schedule your next home visit. Contact information: [address] [place] [address] [phone]              Contact information for after-discharge care     Destination     [facility] .   Service: Skilled Nursing Contact information: [address] [facility] [place]  [address] [phone]                    Discharge Exam: Bradley Sheppard   11/10/23 1215 11/10/23 1600 11/14/23 0525  Weight: 97.5 kg 106 kg 103.6 kg         General:  AAO x 3,  cooperative, no distress;   HEENT:  Normocephalic, PERRL, otherwise with in Normal limits   Neuro:  CNII-XII intact. , normal motor and sensation, reflexes intact   Lungs:   Clear to auscultation BL, Respirations unlabored,  No wheezes / crackles  Cardio:    S1/S2, RRR, No murmure, No Rubs or Gallops   Abdomen:  Soft, non-tender, bowel sounds active all four quadrants, no guarding or peritoneal signs.  Muscular  skeletal:  Limited exam -Sever global generalized weaknesses - in bed, able to move all 4 extremities,   2+ pulses,  symmetric, No pitting edema  Skin:  Dry, warm to touch, negative for any Rashes,  Wounds: Please see nursing documentation   Resolved edema edema as seen in this picture        Condition at discharge: fair  The results of significant diagnostics from this hospitalization (including imaging, microbiology, ancillary and laboratory) are listed below for reference.   Imaging Studies: US  ARTERIAL ABI (SCREENING LOWER EXTREMITY) Result Date: 11/11/2023 CLINICAL DATA:  BILATERAL lower extremity swelling. Hypertension. Hyperlipidemia. Diabetes. EXAM: NONINVASIVE PHYSIOLOGIC VASCULAR STUDY OF BILATERAL LOWER EXTREMITIES  TECHNIQUE: Evaluation of both lower extremities were performed at rest, including calculation of ankle-brachial indices with single level pressure measurements and doppler recording. COMPARISON:  None available. FINDINGS: Right ABI:  1.31 Left ABI:  1.26 Right Lower Extremity: Posterior tibial and dorsalis pedis artery waveforms are monophasic. Left Lower Extremity: Posterior tibial and dorsalis pedis artery waveforms are monophasic. 1.0-1.4 Normal IMPRESSION: Ankle brachial indices are within normal limits bilaterally. Monophasic waveforms at the ankle are likely artifact given to swelling. If there is continued clinical suspicion for peripheral arterial disease, further evaluation with CT angiography of the abdominal aorta with lower extremity runoff should be performed. Electronically Signed   By: Aliene Lloyd M.D.   On: 11/11/2023 11:07   US  Venous Img Lower Unilateral Left Result Date: 11/10/2023 CLINICAL DATA:  Left lower extremity swelling EXAM: LEFT LOWER EXTREMITY VENOUS DOPPLER ULTRASOUND TECHNIQUE: Gray-scale sonography with compression, as well as color and duplex ultrasound, were performed to evaluate the deep venous system(s) from the level of  the common femoral vein through the popliteal and proximal calf veins. COMPARISON:  None Available. FINDINGS: VENOUS Normal compressibility of the common femoral, superficial femoral, and popliteal veins, as well as the visualized calf veins. Visualized portions of profunda femoral vein and great saphenous vein unremarkable. No filling defects to suggest DVT on grayscale or color Doppler imaging. Doppler waveforms show normal direction of venous flow, normal respiratory plasticity and response to augmentation. Limited views of the contralateral common femoral vein are unremarkable. OTHER Edema is present within the superficial soft tissues. Limitations: none IMPRESSION: 1. No evidence of left lower extremity DVT. Electronically Signed   By: Maude Naegeli M.D.    On: 11/10/2023 14:35   DG Chest 2 View Result Date: 11/10/2023 CLINICAL DATA:  Concern for sepsis. EXAM: CHEST - 2 VIEW COMPARISON:  09/04/2022. FINDINGS: Stable cardiomegaly. Aortic atherosclerosis. 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 No focal consolidation, pleural effusion, or pneumothorax. Diffuse osseous demineralization. 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 IMPRESSION: 1. Stable cardiomegaly.  No acute cardiopulmonary findings. 2. 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 Electronically Signed   By: Harrietta Sherry M.D.   On: 11/10/2023 12:57    Microbiology: Results for orders placed or performed during the hospital encounter of 11/10/23  Culture, blood (Routine x 2)     Status: None   Collection Time: 11/10/23 12:38 PM   Specimen: BLOOD LEFT ARM  Result Value Ref Range Status   Specimen Description BLOOD LEFT ARM  Final   Special Requests   Final    BOTTLES DRAWN AEROBIC AND ANAEROBIC Blood Culture adequate volume   Culture   Final    NO GROWTH 5 DAYS Performed at [facility], [address] [place], [place] [address]    Report Status 11/15/2023 FINAL  Final  Culture, blood (Routine x 2)     Status: None   Collection Time: 11/10/23 12:38 PM   Specimen: BLOOD RIGHT HAND  Result Value Ref Range Status   Specimen Description BLOOD RIGHT HAND  Final   Special Requests   Final    BOTTLES DRAWN AEROBIC AND ANAEROBIC Blood Culture adequate volume   Culture   Final    NO GROWTH 5 DAYS Performed at [facility], [address] [place], [place] [address]    Report Status 11/15/2023 FINAL  Final  MRSA Next Gen by PCR, Nasal     Status: None   Collection Time: 11/10/23  4:00 PM   Specimen: Nasal Mucosa; Nasal Swab  Result Value Ref Range Status   MRSA by PCR Next Gen NOT DETECTED NOT  DETECTED Final    Comment: (NOTE) The GeneXpert MRSA Assay (FDA approved for NASAL specimens only), is one component of a comprehensive MRSA colonization surveillance program. It is not intended to diagnose MRSA infection nor to guide or monitor treatment for MRSA infections. Test performance is not FDA approved in patients less than 49 years old. Performed at [facility], [address], [place], [place] [address]     Labs: CBC: Recent Labs  Lab 11/13/23 0436 11/14/23 0432 11/15/23 0413 11/16/23 0449 11/17/23 0445  WBC 8.4 8.6 8.7 8.9 9.7  HGB 11.0* 11.6* 11.1* 11.2* 11.8*  HCT 33.2* 35.4* 33.8* 34.7* 34.6*  MCV 93.3 93.4 92.1 92.0 90.6  PLT 207 221 230 230 251   Basic Metabolic Panel: Recent Labs  Lab 11/14/23 0432 11/15/23 0413 11/16/23 0449 11/17/23 0445  11/19/23 0821  NA 134* 136 138 135 132*  K 3.3* 3.6 3.5 3.2* 3.7  CL 97* 97* 93* 95* 90*  CO2 26 28 28 30 29   GLUCOSE 177* 194* 205* 201* 263*  BUN 76* 85* 85* 93* 101*  CREATININE 2.25* 2.71* 2.42* 2.38* 2.91*  CALCIUM  8.8* 8.5* 9.0 8.7* 8.6*   Liver Function Tests: No results for input(s): AST, ALT, ALKPHOS, BILITOT, PROT, ALBUMIN  in the last 168 hours.  CBG: Recent Labs  Lab 11/18/23 1953 11/18/23 2037 11/19/23 0535 11/19/23 0717 11/19/23 1145  GLUCAP 387* 400* 264* 284* 268*    Discharge time spent: greater than 40 minutes.  Signed: Adriana DELENA Grams, MD [facility] 11/19/2023

## 2023-11-19 NOTE — Progress Notes (Signed)
 Progress note   Patient: Bradley Sheppard MRN: [id number] DOB: [birth year]  Admit date:     11/10/2023  Discharge date: 11/19/23  Discharge Physician: Adriana DELENA Grams   PCP: Rosamond Leta NOVAK, MD   The patient was seen and examined this morning, remained stable no acute distress Still complaining of generalized global weakness, needing to assist with all ADLs and ambulation    Possible discharge in the next 24-48 hours to home with home health- Insurance denied SNF. The patient and family has appealed!    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  Patient has had some fever and tachycardia and was started on Levaquin, but not improving.  He has apparently been taking Levaquin over the last 4 days with no improvement and he was seen by nephrology office today with recommendations to be hospitalized for inpatient management.  Patient was admitted for management of left lower extremity cellulitis and is also noted to have AKI on CKD stage IIIb.  AKI slowly improving, but now noted to have worsening hemoglobin levels and stool occult pending.  Noted to have prior history of GI bleed.  Anticipate discharge once cellulitis further improves and creatinine level stabilized along with hemoglobin levels.     Left lower extremity cellulitis in the setting of venous insufficiency - Blood cultures ordered -no growth to  date - MRSA negative, discontinue vancomycin  - Continue on vancomycin  discontinued, Rocephin  >>> finish 7 days of IV antibiotic treatment - Leg elevation and TED hoses - Holding torsemide  dosing-due to elevated creatinine - ABI unremarkable - Left lower extremity without DVT   AKI on CKD stage IIIb-improving - Follows with Dr. Rachele nephrology outpatient - POA: some anasarca with low albumin  - S/p albumin  replacement with IV Lasix -switch to p.o. torsemide  - Elevate legs     Anemia of chronic disease -with iron  deficiency -Mild drop in hemoglobin but stable - H&H stable, continue iron  supplements   Chronic HFpEF -No symptoms of dyspnea or orthopnea noted at this time, but volume overloaded -Chest x-ray with stable cardiomegaly - Echo on 09/2022 with EF 55-60% - Plan to continue home diuretics with use of albumin    Hypokalemia -resolved  Type 2 diabetes - SSI and carb modified diet   PAF with RVR - Not on anticoagulation due to rectal bleeding and weakness predisposing to falls - Follows with Dr. 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  Patient is cleared to be discharged to SNF, unfortunately denied by insurance - The patient family appealing  Progressive decline recommending close follow-up with palliative care  Code Status: DNR  Disposition: Home Diet recommendation:   Discharge Diet Orders (From admission, onward)     Start     Ordered   11/12/23 0000  Diet - low sodium heart healthy        11/12/23 1134           Cardiac diet DISCHARGE MEDICATION:    Contact information for follow-up providers     Care, [facility] Follow up.   Specialty: Home Health Services Why: Home health will call to schedule your next home visit. Contact information: [address] [place] [address] [phone]              Contact information for after-discharge care     Destination     [facility] .   Service: Skilled Nursing Contact information: [address] [facility] [place]  [address] [phone]                    Discharge Exam: Filed Weights   11/10/23 1215 11/10/23 1600 11/14/23 0525  Weight: 97.5 kg 106 kg 103.6 kg         General:  AAO x 3,  cooperative, no distress;   HEENT:  Normocephalic, PERRL, otherwise with in Normal limits   Neuro:  CNII-XII intact. , normal motor and sensation, reflexes intact   Lungs:   Clear to auscultation BL, Respirations unlabored,  No wheezes / crackles  Cardio:    S1/S2, RRR, No murmure, No Rubs or Gallops   Abdomen:  Soft, non-tender, bowel sounds active all four quadrants, no guarding or peritoneal signs.  Muscular  skeletal:  Limited exam -Sever global generalized weaknesses - in bed, able to move all 4 extremities,   2+ pulses,  symmetric, No pitting edema  Skin:  Dry, warm to touch, negative for any Rashes,  Wounds: Please see nursing documentation              Resolved edema edema as seen in this picture        Condition at discharge: fair  The results of significant diagnostics from this hospitalization (including imaging, microbiology, ancillary and laboratory) are listed below for reference.   Imaging Studies: US  ARTERIAL ABI (SCREENING LOWER EXTREMITY) Result Date: 11/11/2023 CLINICAL DATA:  BILATERAL lower  extremity swelling. Hypertension. Hyperlipidemia. Diabetes. EXAM: NONINVASIVE PHYSIOLOGIC VASCULAR STUDY OF BILATERAL LOWER EXTREMITIES TECHNIQUE: Evaluation of both lower extremities were performed at rest, including calculation of ankle-brachial indices with single level pressure measurements and doppler recording. COMPARISON:  None available. FINDINGS: Right ABI:  1.31 Left ABI:  1.26 Right Lower Extremity: Posterior tibial and dorsalis pedis artery waveforms are monophasic. Left Lower Extremity: Posterior tibial and dorsalis pedis artery waveforms are monophasic. 1.0-1.4 Normal IMPRESSION: Ankle brachial indices are within normal limits bilaterally. Monophasic waveforms at the ankle are likely artifact given to swelling. If there is continued clinical suspicion for peripheral arterial disease, further evaluation with CT angiography of the abdominal aorta with lower extremity runoff should be performed. Electronically Signed   By: Aliene Lloyd M.D.   On: 11/11/2023 11:07   US  Venous Img Lower Unilateral Left Result Date: 11/10/2023 CLINICAL DATA:  Left lower extremity swelling  EXAM: LEFT LOWER EXTREMITY VENOUS DOPPLER ULTRASOUND TECHNIQUE: Gray-scale sonography with compression, as well as color and duplex ultrasound, were performed to evaluate the deep venous system(s) from the level of the common femoral vein through the popliteal and proximal calf veins. COMPARISON:  None Available. FINDINGS: VENOUS Normal compressibility of the common femoral, superficial femoral, and popliteal veins, as well as the visualized calf veins. Visualized portions of profunda femoral vein and great saphenous vein unremarkable. No filling defects to suggest DVT on grayscale or color Doppler imaging. Doppler waveforms show normal direction of venous flow, normal respiratory plasticity and response to augmentation. Limited views of the contralateral common femoral vein are unremarkable. OTHER Edema is present within the superficial  soft tissues. Limitations: none IMPRESSION: 1. No evidence of left lower extremity DVT. Electronically Signed   By: Maude Naegeli M.D.   On: 11/10/2023 14:35   DG Chest 2 View Result Date: 11/10/2023 CLINICAL DATA:  Concern for sepsis. EXAM: CHEST - 2 VIEW COMPARISON:  09/04/2022. FINDINGS: Stable cardiomegaly. Aortic atherosclerosis. 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 No focal consolidation, pleural effusion, or pneumothorax. Diffuse osseous demineralization. 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 IMPRESSION: 1. Stable cardiomegaly.  No acute cardiopulmonary findings. 2. 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 Electronically Signed   By: Harrietta Sherry M.D.   On: 11/10/2023 12:57    Microbiology: Results for orders placed or performed during the hospital encounter of 11/10/23  Culture, blood (Routine x 2)     Status: None   Collection Time: 11/10/23 12:38 PM   Specimen: BLOOD LEFT ARM  Result Value Ref Range Status   Specimen Description BLOOD LEFT ARM  Final   Special Requests   Final    BOTTLES DRAWN AEROBIC AND ANAEROBIC Blood Culture adequate volume   Culture   Final    NO GROWTH 5 DAYS Performed at [facility], [address] [place], [place] [address]    Report Status 11/15/2023 FINAL  Final  Culture, blood (Routine x 2)     Status: None   Collection Time: 11/10/23 12:38 PM   Specimen: BLOOD RIGHT HAND  Result Value Ref Range Status   Specimen Description BLOOD RIGHT HAND  Final   Special Requests   Final    BOTTLES DRAWN AEROBIC AND ANAEROBIC Blood Culture adequate volume   Culture   Final    NO GROWTH 5 DAYS Performed at [facility], [address], [place], [place] [address]    Report Status 11/15/2023 FINAL  Final  MRSA Next Gen by PCR, Nasal     Status: None   Collection Time:  11/10/23  4:00 PM   Specimen: Nasal Mucosa; Nasal Swab  Result Value Ref Range Status   MRSA by PCR Next Gen NOT DETECTED NOT DETECTED Final    Comment: (NOTE) The GeneXpert MRSA Assay (FDA approved for NASAL specimens only), is one component of a comprehensive MRSA colonization surveillance program. It is not intended to diagnose MRSA infection nor to guide or monitor treatment for MRSA infections. Test performance is not FDA approved in patients less than 77 years old. Performed at [facility], [address], [place], [place] [address]     Labs: CBC: Recent Labs  Lab 11/13/23 0436 11/14/23 0432 11/15/23 0413 11/16/23 0449 11/17/23 0445  WBC 8.4 8.6 8.7 8.9 9.7  HGB 11.0* 11.6* 11.1* 11.2* 11.8*  HCT 33.2* 35.4* 33.8* 34.7* 34.6*  MCV 93.3 93.4 92.1 92.0 90.6  PLT 207 221 230 230 251   Basic Metabolic Panel: Recent Labs  Lab 11/14/23 0432 11/15/23 0413 11/16/23 0449 11/17/23 0445 11/19/23 0821  NA 134* 136 138 135 132*  K 3.3* 3.6 3.5 3.2* 3.7  CL 97* 97* 93* 95* 90*  CO2 26 28 28 30 29   GLUCOSE 177* 194* 205* 201* 263*  BUN 76* 85* 85* 93* 101*  CREATININE 2.25* 2.71* 2.42* 2.38* 2.91*  CALCIUM  8.8* 8.5* 9.0 8.7* 8.6*   Liver Function Tests: No results for input(s): AST, ALT, ALKPHOS, BILITOT, PROT, ALBUMIN  in the last 168 hours.  CBG: Recent Labs  Lab 11/18/23 1608 11/18/23 1953 11/18/23 2037 11/19/23 0535 11/19/23 0717  GLUCAP 199* 387* 400* 264* 284*    Discharge time spent: greater than 40 minutes.  Signed: Adriana DELENA Grams, MD [facility] 11/19/2023

## 2023-11-19 NOTE — TOC Transition Note (Signed)
 Transition of Care [facility]) - Discharge Note   Patient Details  Name: Bradley Sheppard MRN: [id number] Date of Birth: 07/09/1940  Transition of Care [facility]) CM/SW Contact:  Noreen KATHEE Pinal, LCSWA Phone Number: 11/19/2023, 1:04 PM   Clinical Narrative:     Family decided to take patient home instead of waiting on the second appeal. Daughter shared that they do not need the oxygen anymore, only the WC. CSW reached out to [employer] and spoke with Ms. Dolanda regarding WC and  Cory with [facility] about resuming HH. TOC signing off.    Final next level of care: Home w Home Health Services Barriers to Discharge: Other (must enter comment) (Family decided to take patient home)   Patient Goals and CMS Choice Patient states their goals for this hospitalization and ongoing recovery are:: DC home CMS Medicare.gov Compare Post Acute Care list provided to:: Patient Represenative (must comment) (Spouse Meade) Choice offered to / list presented to : Spouse      Discharge Placement                Patient to be transferred to facility by: [facility] Name of family member notified: Meade and karen Patient and family notified of of transfer: 11/19/23  Discharge Plan and Services Additional resources added to the After Visit Summary for   In-house Referral: Clinical Social Work   Post Acute Care Choice: Horticulturist, commercial, Home Health          DME Arranged: Wheelchair manual DME Agency: [facility] Date DME Agency Contacted: 11/19/23 Time DME Agency Contacted: 1304 Representative spoke with at DME Agency: Dolanda HH Arranged: PT, OT, Nurse's Aide HH Agency: [facility] Date [facility] Agency Contacted: 11/19/23 Time HH Agency Contacted: 1304 Representative spoke with at [facility] Agency: Darleene  Social Drivers of Health (SDOH) Interventions SDOH Screenings   Food Insecurity: No Food Insecurity (11/10/2023)  Housing: Low Risk  (11/10/2023)  Transportation Needs: No Transportation Needs (11/10/2023)   Utilities: Not At Risk (11/10/2023)  Financial Resource Strain: Low Risk  (03/27/2019)   Received from [facility]  Physical Activity: Inactive (03/27/2019)   Received from [facility]  Social Connections: Moderately Isolated (11/10/2023)  Stress: No Stress Concern Present (03/27/2019)   Received from [facility]  Tobacco Use: High Risk (11/16/2023)   Received from [facility]  Health Literacy: Medium Risk (08/12/2020)   Received from [facility]     Readmission Risk Interventions    11/19/2023   12:59 PM 11/19/2023   11:05 AM 11/18/2023   12:44 PM  Readmission Risk Prevention Plan  Transportation Screening Complete Complete Complete  HRI or Home Care Consult Complete Complete Complete  Social Work Consult for Recovery Care Planning/Counseling Complete Complete Complete  Palliative Care Screening Not Applicable Not Applicable Not Applicable  Medication Review Oceanographer) Complete Complete Complete

## 2023-11-19 NOTE — Plan of Care (Signed)
   Problem: Activity: Goal: Risk for activity intolerance will decrease Outcome: Progressing   Problem: Coping: Goal: Level of anxiety will decrease Outcome: Progressing

## 2023-11-19 NOTE — Progress Notes (Signed)
 Mobility Specialist Progress Note:    11/19/23 0905  Mobility  Activity Transferred from bed to chair  Level of Assistance Maximum assist, patient does 25-49% (+2)  Assistive Device Front wheel walker  Distance Ambulated (ft) 3 ft  Range of Motion/Exercises Active;All extremities  Activity Response Tolerated well  Mobility Referral Yes  Mobility visit 1 Mobility  Mobility Specialist Start Time (ACUTE ONLY) [id number]  Mobility Specialist Stop Time (ACUTE ONLY) 0925  Mobility Specialist Time Calculation (min) (ACUTE ONLY) 20 min   Pt received in bed, agreeable to mobility. Required MaxA +2 to stand and transfer. Tolerated well, c/o left leg pain. Alarm on, all needs met.  Clinton Wahlberg Mobility Specialist Please contact via Special educational needs teacher or  Rehab office at [phone]

## 2023-11-20 DIAGNOSIS — J849 Interstitial pulmonary disease, unspecified: Secondary | ICD-10-CM | POA: Diagnosis not present

## 2023-11-20 DIAGNOSIS — R5381 Other malaise: Secondary | ICD-10-CM | POA: Diagnosis not present

## 2023-11-20 DIAGNOSIS — I509 Heart failure, unspecified: Secondary | ICD-10-CM | POA: Diagnosis not present

## 2023-11-23 DIAGNOSIS — E1122 Type 2 diabetes mellitus with diabetic chronic kidney disease: Secondary | ICD-10-CM | POA: Diagnosis not present

## 2023-11-23 DIAGNOSIS — I5032 Chronic diastolic (congestive) heart failure: Secondary | ICD-10-CM | POA: Diagnosis not present

## 2023-11-25 ENCOUNTER — Other Ambulatory Visit: Payer: Self-pay | Admitting: Nurse Practitioner

## 2023-11-29 DIAGNOSIS — N179 Acute kidney failure, unspecified: Secondary | ICD-10-CM | POA: Diagnosis not present

## 2023-11-29 DIAGNOSIS — E1122 Type 2 diabetes mellitus with diabetic chronic kidney disease: Secondary | ICD-10-CM | POA: Diagnosis not present

## 2023-11-29 DIAGNOSIS — I130 Hypertensive heart and chronic kidney disease with heart failure and stage 1 through stage 4 chronic kidney disease, or unspecified chronic kidney disease: Secondary | ICD-10-CM | POA: Diagnosis not present

## 2023-11-29 DIAGNOSIS — I5032 Chronic diastolic (congestive) heart failure: Secondary | ICD-10-CM | POA: Diagnosis not present

## 2023-12-07 DIAGNOSIS — N179 Acute kidney failure, unspecified: Secondary | ICD-10-CM | POA: Diagnosis not present

## 2023-12-14 ENCOUNTER — Inpatient Hospital Stay: Payer: HMO | Attending: Oncology

## 2023-12-14 DIAGNOSIS — R768 Other specified abnormal immunological findings in serum: Secondary | ICD-10-CM | POA: Insufficient documentation

## 2023-12-14 DIAGNOSIS — Z7984 Long term (current) use of oral hypoglycemic drugs: Secondary | ICD-10-CM | POA: Insufficient documentation

## 2023-12-14 DIAGNOSIS — C884 Extranodal marginal zone b-cell lymphoma of mucosa-associated lymphoid tissue (malt-lymphoma) not having achieved remission: Secondary | ICD-10-CM | POA: Insufficient documentation

## 2023-12-14 DIAGNOSIS — Z79899 Other long term (current) drug therapy: Secondary | ICD-10-CM | POA: Insufficient documentation

## 2023-12-14 DIAGNOSIS — D649 Anemia, unspecified: Secondary | ICD-10-CM | POA: Insufficient documentation

## 2023-12-14 DIAGNOSIS — Z7982 Long term (current) use of aspirin: Secondary | ICD-10-CM | POA: Insufficient documentation

## 2023-12-20 DIAGNOSIS — N1832 Chronic kidney disease, stage 3b: Secondary | ICD-10-CM | POA: Diagnosis not present

## 2023-12-20 DIAGNOSIS — E1122 Type 2 diabetes mellitus with diabetic chronic kidney disease: Secondary | ICD-10-CM | POA: Diagnosis not present

## 2023-12-20 DIAGNOSIS — I5032 Chronic diastolic (congestive) heart failure: Secondary | ICD-10-CM | POA: Diagnosis not present

## 2023-12-20 DIAGNOSIS — N179 Acute kidney failure, unspecified: Secondary | ICD-10-CM | POA: Diagnosis not present

## 2023-12-21 ENCOUNTER — Ambulatory Visit: Payer: HMO | Admitting: Hematology

## 2023-12-21 ENCOUNTER — Inpatient Hospital Stay: Admitting: Oncology

## 2023-12-21 DIAGNOSIS — I509 Heart failure, unspecified: Secondary | ICD-10-CM | POA: Diagnosis not present

## 2023-12-21 DIAGNOSIS — J849 Interstitial pulmonary disease, unspecified: Secondary | ICD-10-CM | POA: Diagnosis not present

## 2023-12-21 DIAGNOSIS — R5381 Other malaise: Secondary | ICD-10-CM | POA: Diagnosis not present

## 2023-12-22 ENCOUNTER — Inpatient Hospital Stay

## 2023-12-22 DIAGNOSIS — Z7984 Long term (current) use of oral hypoglycemic drugs: Secondary | ICD-10-CM | POA: Diagnosis not present

## 2023-12-22 DIAGNOSIS — D508 Other iron deficiency anemias: Secondary | ICD-10-CM

## 2023-12-22 DIAGNOSIS — C884 Extranodal marginal zone b-cell lymphoma of mucosa-associated lymphoid tissue (malt-lymphoma) not having achieved remission: Secondary | ICD-10-CM | POA: Diagnosis not present

## 2023-12-22 DIAGNOSIS — D472 Monoclonal gammopathy: Secondary | ICD-10-CM

## 2023-12-22 DIAGNOSIS — R768 Other specified abnormal immunological findings in serum: Secondary | ICD-10-CM | POA: Diagnosis not present

## 2023-12-22 DIAGNOSIS — D649 Anemia, unspecified: Secondary | ICD-10-CM | POA: Diagnosis not present

## 2023-12-22 DIAGNOSIS — Z7982 Long term (current) use of aspirin: Secondary | ICD-10-CM | POA: Diagnosis not present

## 2023-12-22 DIAGNOSIS — Z79899 Other long term (current) drug therapy: Secondary | ICD-10-CM | POA: Diagnosis not present

## 2023-12-22 LAB — IRON AND TIBC
Iron: 45 ug/dL (ref 45–182)
Saturation Ratios: 21 % (ref 17.9–39.5)
TIBC: 219 ug/dL — ABNORMAL LOW (ref 250–450)
UIBC: 174 ug/dL

## 2023-12-22 LAB — CBC WITH DIFFERENTIAL/PLATELET
Abs Immature Granulocytes: 0.02 K/uL (ref 0.00–0.07)
Basophils Absolute: 0.1 K/uL (ref 0.0–0.1)
Basophils Relative: 1 %
Eosinophils Absolute: 0.2 K/uL (ref 0.0–0.5)
Eosinophils Relative: 3 %
HCT: 38.5 % — ABNORMAL LOW (ref 39.0–52.0)
Hemoglobin: 12.6 g/dL — ABNORMAL LOW (ref 13.0–17.0)
Immature Granulocytes: 0 %
Lymphocytes Relative: 37 %
Lymphs Abs: 3.1 K/uL (ref 0.7–4.0)
MCH: 30.4 pg (ref 26.0–34.0)
MCHC: 32.7 g/dL (ref 30.0–36.0)
MCV: 92.8 fL (ref 80.0–100.0)
Monocytes Absolute: 0.5 K/uL (ref 0.1–1.0)
Monocytes Relative: 7 %
Neutro Abs: 4.5 K/uL (ref 1.7–7.7)
Neutrophils Relative %: 52 %
Platelets: 203 K/uL (ref 150–400)
RBC: 4.15 MIL/uL — ABNORMAL LOW (ref 4.22–5.81)
RDW: 13.8 % (ref 11.5–15.5)
WBC: 8.3 K/uL (ref 4.0–10.5)
nRBC: 0 % (ref 0.0–0.2)

## 2023-12-22 LAB — COMPREHENSIVE METABOLIC PANEL WITH GFR
ALT: 20 U/L (ref 0–44)
AST: 28 U/L (ref 15–41)
Albumin: 3 g/dL — ABNORMAL LOW (ref 3.5–5.0)
Alkaline Phosphatase: 97 U/L (ref 38–126)
Anion gap: 12 (ref 5–15)
BUN: 91 mg/dL — ABNORMAL HIGH (ref 8–23)
CO2: 26 mmol/L (ref 22–32)
Calcium: 8.6 mg/dL — ABNORMAL LOW (ref 8.9–10.3)
Chloride: 97 mmol/L — ABNORMAL LOW (ref 98–111)
Creatinine, Ser: 2.45 mg/dL — ABNORMAL HIGH (ref 0.61–1.24)
GFR, Estimated: 25 mL/min — ABNORMAL LOW (ref >60)
Glucose, Bld: 225 mg/dL — ABNORMAL HIGH (ref 70–99)
Potassium: 3.8 mmol/L (ref 3.5–5.1)
Sodium: 135 mmol/L (ref 135–145)
Total Bilirubin: 0.7 mg/dL (ref 0.0–1.2)
Total Protein: 9 g/dL — ABNORMAL HIGH (ref 6.5–8.1)

## 2023-12-22 LAB — FERRITIN: Ferritin: 180 ng/mL (ref 24–336)

## 2023-12-22 LAB — LACTATE DEHYDROGENASE: LDH: 145 U/L (ref 98–192)

## 2023-12-22 LAB — MAGNESIUM: Magnesium: 2.4 mg/dL (ref 1.7–2.4)

## 2023-12-23 LAB — KAPPA/LAMBDA LIGHT CHAINS
Kappa free light chain: 493.6 mg/L — ABNORMAL HIGH (ref 3.3–19.4)
Kappa, lambda light chain ratio: 24.8 — ABNORMAL HIGH (ref 0.26–1.65)
Lambda free light chains: 19.9 mg/L (ref 5.7–26.3)

## 2023-12-24 LAB — PROTEIN ELECTROPHORESIS, SERUM
A/G Ratio: 0.6 — ABNORMAL LOW (ref 0.7–1.7)
Albumin ELP: 3.4 g/dL (ref 2.9–4.4)
Alpha-1-Globulin: 0.2 g/dL (ref 0.0–0.4)
Alpha-2-Globulin: 0.8 g/dL (ref 0.4–1.0)
Beta Globulin: 0.8 g/dL (ref 0.7–1.3)
Gamma Globulin: 3.9 g/dL — ABNORMAL HIGH (ref 0.4–1.8)
Globulin, Total: 5.7 g/dL — ABNORMAL HIGH (ref 2.2–3.9)
M-Spike, %: 3.6 g/dL — ABNORMAL HIGH
Total Protein ELP: 9.1 g/dL — ABNORMAL HIGH (ref 6.0–8.5)

## 2023-12-28 ENCOUNTER — Ambulatory Visit: Attending: Nurse Practitioner | Admitting: Nurse Practitioner

## 2023-12-28 ENCOUNTER — Encounter: Payer: Self-pay | Admitting: Nurse Practitioner

## 2023-12-28 VITALS — BP 112/70 | HR 74 | Ht 72.0 in | Wt 220.0 lb

## 2023-12-28 DIAGNOSIS — I4892 Unspecified atrial flutter: Secondary | ICD-10-CM | POA: Diagnosis not present

## 2023-12-28 DIAGNOSIS — Z8679 Personal history of other diseases of the circulatory system: Secondary | ICD-10-CM | POA: Diagnosis not present

## 2023-12-28 DIAGNOSIS — I3139 Other pericardial effusion (noninflammatory): Secondary | ICD-10-CM

## 2023-12-28 DIAGNOSIS — I2510 Atherosclerotic heart disease of native coronary artery without angina pectoris: Secondary | ICD-10-CM

## 2023-12-28 DIAGNOSIS — R600 Localized edema: Secondary | ICD-10-CM

## 2023-12-28 DIAGNOSIS — D649 Anemia, unspecified: Secondary | ICD-10-CM

## 2023-12-28 DIAGNOSIS — R000 Tachycardia, unspecified: Secondary | ICD-10-CM

## 2023-12-28 DIAGNOSIS — I5032 Chronic diastolic (congestive) heart failure: Secondary | ICD-10-CM | POA: Diagnosis not present

## 2023-12-28 DIAGNOSIS — I480 Paroxysmal atrial fibrillation: Secondary | ICD-10-CM

## 2023-12-28 DIAGNOSIS — I350 Nonrheumatic aortic (valve) stenosis: Secondary | ICD-10-CM | POA: Diagnosis not present

## 2023-12-28 DIAGNOSIS — C884 Extranodal marginal zone b-cell lymphoma of mucosa-associated lymphoid tissue (malt-lymphoma) not having achieved remission: Secondary | ICD-10-CM

## 2023-12-28 DIAGNOSIS — I872 Venous insufficiency (chronic) (peripheral): Secondary | ICD-10-CM | POA: Diagnosis not present

## 2023-12-28 DIAGNOSIS — D472 Monoclonal gammopathy: Secondary | ICD-10-CM

## 2023-12-28 DIAGNOSIS — N1832 Chronic kidney disease, stage 3b: Secondary | ICD-10-CM | POA: Diagnosis not present

## 2023-12-28 MED ORDER — MIDODRINE HCL 5 MG PO TABS: ORAL_TABLET | ORAL | 2 refills | Status: DC

## 2023-12-28 NOTE — Progress Notes (Signed)
 Patient Care Team: Bradley Leta NOVAK, MD as PCP - General (Internal Medicine) Bradley Jayson MATSU, MD as PCP - Cardiology (Cardiology) Bradley Lamar HERO, MD as Consulting Physician (Gastroenterology) Bradley Standing, MD as Consulting Physician (General Surgery) Bradley Belvie CROME, MD as Consulting Physician (Urology)  Clinic Day:  12/29/2023  Referring physician: Rosamond Leta NOVAK, MD   CHIEF COMPLAINT:  CC: [name]   Bradley Sheppard Mascot 83 y.o. male was transferred to my care after his prior physician has left.   ASSESSMENT & PLAN:   Assessment & Plan: Bradley Sheppard  is a 83 y.o. 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  MYD 88 negative Patient currently has a mild anemia but no other symptoms No B symptoms  - Continue to monitor at this time - Can consider single agent rituximab if patient is of better functional status, has progression of disease with cytopenias or B symptoms  Return to clinic in 3 months with labs MGUS (monoclonal gammopathy of unknown significance) Patient has M spike that has been increasing recently.  In the past with subsequent up from 1.4-3.6 He also has worsening kappa light chains with increasing ratio  - Discussed risk versus benefits with the patient.  Discussed that he will need bone marrow biopsy for diagnosis and further treatment. - Even though MALT lymphoma can present with immunoglobulin elevation, it is more common with IgM and it appears like patient has IgG. - Patient and family are not very motivated to do bone marrow biopsy at this time.  They would like to repeat labs in 3 months and discuss at that time.  Return to clinic in 3 months with labs Other iron  deficiency anemia Iron  panel within normal limits today    The patient understands the plans discussed today and is in agreement with them.  He knows to contact  our office if he develops concerns prior to his next appointment.  60 minutes of total time was spent for this patient encounter, including preparation, face-to-face counseling with the patient and coordination of care, physical exam, and documentation of the encounter.   Bradley Sheppard,acting as a Neurosurgeon for Bradley Dry, MD.,have documented all relevant documentation on the behalf of Bradley Dry, MD,as directed by  Bradley Dry, MD while in the presence of Bradley Dry, MD.  I, Bradley Dry MD, have reviewed the above documentation for accuracy and completeness, and I agree with the above.     Bradley Dry, MD  [name] [place] - A DEPT OF [facility] [address] [place]  [place] [address] Dept: [phone] Dept Fax: [phone]   Orders Placed This Encounter  Procedures   CBC with Differential    Sheppard Status:   Future    Expected Date:   03/27/2024    Expiration Date:   06/25/2024   Comprehensive metabolic panel    Sheppard Status:   Future    Expected Date:   03/27/2024    Expiration Date:   06/25/2024   Lactate dehydrogenase    Sheppard Status:   Future    Expected Date:   03/27/2024    Expiration Date:   06/25/2024   Kappa/lambda light chains    Sheppard Status:   Future    Expected Date:   03/27/2024    Expiration Date:   06/25/2024   Protein electrophoresis,  serum    Sheppard Status:   Future    Expected Date:   03/27/2024    Expiration Date:   06/25/2024   Iron  and TIBC ([facility] ONLY)    Sheppard Status:   Future    Expected Date:   03/27/2024    Expiration Date:   06/25/2024   Ferritin    Sheppard Status:   Future    Expected Date:   03/27/2024    Expiration Date:   06/25/2024   Vitamin B12    Sheppard Status:   Future    Expected Date:   03/27/2024    Expiration Date:   06/25/2024   Folate    Sheppard Status:   Future    Expected Date:   03/27/2024     Expiration Date:   06/25/2024   Multiple Myeloma Panel (SPEP&IFE w/QIG)    Sheppard Status:   Future    Expected Date:   03/27/2024    Expiration Date:   12/28/2024     ONCOLOGY HISTORY:   I have reviewed his chart and materials related to his cancer extensively and collaborated history with the patient. 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 Creatinine 1.56. -06/12/2021: CBC with HGB at 9.1, MCV 94, normal WBC and PLT. Creatinine 1.4. -07/11/2021: SPEP: M spike: 1.4, IgG: 2224, IgM: 44, IgA: 74.  Free kappa light chain: 313.4, free lambda light chain: 17.8, ratio: 17.61 -09/02/2021: Bone Marrow Biopsy.  -Pathology: Cellular marrow involved by non-Hodgkin B-cell lymphoma with  plasmacytic differentiation. The differential diagnosis MALT lymphoma (favored) and lymphoplasmacytic  lymphoma.   -MYD88: Negative  -Cytogenetics: 46, XY  -Myeloma FISH panel: negative -Low-grade lymphoma FISH: Negative for BCL6 rearrangement, MALT1 rearrangement, t(11;14) and t(14;18). Normal MM FISH panel - 12/22/2023: SPEP: M spike: 3.6, free kappa light chain: 493.6, free lambda light chain: 19.9, ratio: 24.8, LDH: Normal  Current Treatment: Close monitoring  INTERVAL HISTORY:   Bradley Sheppard is here today for follow up. Patient is accompanied by his daughter and wife today.   He was recently hospitalized due to cellulitis in his leg, which worsened despite initial treatment. He received home health care following his hospital discharge to aid in his recovery. His daughter notes that he became weak during his hospital stay due to prolonged bed rest but is currently regaining strength with the help of a therapist.  Patient reports no other complaints today.  He is overall more restricted to the bed or recliner but is trying to gain strength.  Denies fevers chills, abdominal  pain, loss of appetite, night sweats.   I have reviewed the past medical history, past surgical history, social history and family history with the patient and they are unchanged from previous note.  ALLERGIES:  is allergic to nsaids, atorvastatin, cefuroxime axetil, and prednisone.  MEDICATIONS:  Current Outpatient Medications  Medication Sig Dispense Refill   acetaminophen  (TYLENOL ) 500 MG tablet Take 1,000 mg by mouth every 6 (six) hours as needed for moderate pain.     aspirin  EC 81 MG tablet Take 1 tablet (81 mg total) by mouth daily with breakfast. Swallow whole. 30 tablet 12   Blood Pressure Monitoring (BLOOD PRESSURE CUFF) MISC 1 each by Does not apply route as directed. Dx: hypertension & heart failure 1 each 0   Calcium  Polycarbophil (FIBER-LAX PO) Take 1 capsule by mouth daily.  ferrous sulfate  325 (65 FE) MG tablet Take 1 tablet (325 mg total) by mouth 2 (two) times daily with a meal. 30 tablet 3   FT SENNA-S 8.6-50 MG tablet Take 1 tablet by mouth at bedtime.     glipiZIDE  (GLUCOTROL  XL) 10 MG 24 hr tablet Take 1 tablet (10 mg total) by mouth daily with breakfast. (Patient taking differently: Take 10 mg by mouth 2 (two) times daily.)     insulin  aspart (NOVOLOG ) 100 UNIT/ML injection Inject 8 Units into the skin at bedtime.     LANTUS  SOLOSTAR 100 UNIT/ML Solostar Pen Inject 12 Units into the skin daily.     LORazepam  (ATIVAN ) 0.5 MG tablet Take 1 tablet (0.5 mg total) by mouth at bedtime as needed for anxiety or sleep. 3 tablet 0   metolazone  (ZAROXOLYN ) 5 MG tablet Take 2.5 mg by mouth 2 (two) times a week.     midodrine  (PROAMATINE ) 5 MG tablet Take Midodrine  10 mg in AM and 5 mg in afternoon and 5 mg in the evening. 120 tablet 2   nitroGLYCERIN  (NITROSTAT ) 0.4 MG SL tablet DISSOLVE 1 TABLET UNDER THE TONGUE EVERY 5 MINUTES AS NEEDED FOR CHEST PAIN. DO NOT EXCEED A TOTAL OF 3 DOSES IN 15 MINUTES. 25 tablet 3   oxyCODONE  (OXY IR/ROXICODONE ) 5 MG immediate release tablet Take  0.5-1 tablets (2.5-5 mg total) by mouth every 6 (six) hours as needed for severe pain. 5 tablet 0   polyethylene glycol (MIRALAX  / GLYCOLAX ) 17 g packet Take 17 g by mouth 2 (two) times daily as needed for mild constipation. 14 each 0   potassium chloride  (KLOR-CON ) 10 MEQ tablet Take 20 mEq by mouth daily as needed. Take with Metalozone     rOPINIRole  (REQUIP ) 0.25 MG tablet Take 0.25 mg by mouth at bedtime.     rosuvastatin  (CRESTOR ) 10 MG tablet 1 (10 mg tablet) every Wednesday 90 tablet 1   tamsulosin  (FLOMAX ) 0.4 MG CAPS capsule Take 1 capsule (0.4 mg total) by mouth daily. 90 capsule 1   torsemide  (DEMADEX ) 20 MG tablet Take 1 tablet (20 mg total) by mouth daily. 30 tablet 0   vitamin B-12 (CYANOCOBALAMIN ) 500 MCG tablet Take 500 mcg by mouth daily.     vitamin C  (ASCORBIC ACID ) 500 MG tablet Take 500 mg by mouth at bedtime.     No current facility-administered medications for this visit.    REVIEW OF SYSTEMS:   Constitutional: Denies fevers, chills or abnormal weight loss Eyes: Denies blurriness of vision Ears, nose, mouth, throat, and face: Denies mucositis or sore throat Respiratory: Denies cough, dyspnea or wheezes Cardiovascular: Denies palpitation, chest discomfort or lower extremity swelling Gastrointestinal:  Denies nausea, heartburn or change in bowel habits Skin: Denies abnormal skin rashes Lymphatics: Denies new lymphadenopathy or easy bruising Neurological:Denies numbness, tingling or new weaknesses Behavioral/Psych: Mood is stable, no new changes  All other systems were reviewed with the patient and are negative.   VITALS:  Blood pressure 101/64, pulse 87, temperature 97.9 F (36.6 C), temperature source Oral, resp. rate 18, SpO2 99%.  Wt Readings from Last 3 Encounters:  12/28/23 220 lb (99.8 kg)  11/14/23 228 lb 6.3 oz (103.6 kg)  09/07/23 215 lb (97.5 kg)    There is no height or weight on file to calculate BMI.  Performance status (ECOG): 3 - Symptomatic,  >50% confined to bed  PHYSICAL EXAM:   GENERAL:Frail man, alert, no distress and comfortable SKIN: skin color, texture, turgor are normal,  no rashes or significant lesions EYES: normal, Conjunctiva are pink and non-injected, sclera clear LYMPH:  no palpable lymphadenopathy in the cervical, axillary or inguinal LUNGS: clear to auscultation and percussion with normal breathing effort HEART: regular rate & rhythm and no murmurs and no lower extremity edema ABDOMEN:abdomen soft, non-tender and normal bowel sounds Musculoskeletal:no cyanosis of digits and no clubbing  NEURO: alert & oriented x 3 with fluent speech, no focal motor/sensory deficits  LABORATORY DATA:  I have reviewed the data as listed   Lab Results  Component Value Date   WBC 8.3 12/22/2023   NEUTROABS 4.5 12/22/2023   HGB 12.6 (L) 12/22/2023   HCT 38.5 (L) 12/22/2023   MCV 92.8 12/22/2023   PLT 203 12/22/2023      Chemistry      Component Value Date/Time   NA 135 12/22/2023 0940   NA 138 10/30/2022 1406   K 3.8 12/22/2023 0940   CL 97 (L) 12/22/2023 0940   CO2 26 12/22/2023 0940   BUN 91 (H) 12/22/2023 0940   BUN 67 (H) 10/30/2022 1406   CREATININE 2.45 (H) 12/22/2023 0940      Component Value Date/Time   CALCIUM  8.6 (L) 12/22/2023 0940   ALKPHOS 97 12/22/2023 0940   AST 28 12/22/2023 0940   ALT 20 12/22/2023 0940   BILITOT 0.7 12/22/2023 0940   BILITOT 0.4 12/09/2021 0948       Latest Reference Range & Units 12/22/23 09:41  Iron  45 - 182 ug/dL 45  UIBC ug/dL 825  TIBC 749 - 549 ug/dL 780 (L)  Saturation Ratios 17.9 - 39.5 % 21  Ferritin 24 - 336 ng/mL 180  (L): Data is abnormally low  Latest Reference Range & Units 12/22/23 09:40  Total Protein ELP 6.0 - 8.5 g/dL 9.1 (H)  Albumin  ELP 2.9 - 4.4 g/dL 3.4  Globulin, Total 2.2 - 3.9 g/dL 5.7 (H) (C)  A/G Ratio 0.7 - 1.7  0.6 (L) (C)  Alpha-1-Globulin 0.0 - 0.4 g/dL 0.2  Joeyj-7-Honalopw 0.4 - 1.0 g/dL 0.8  Beta Globulin 0.7 - 1.3 g/dL 0.8  Gamma  Globulin 0.4 - 1.8 g/dL 3.9 (H)  M-SPIKE, % Not Observed g/dL 3.6 (H)  SPE Interp.  Comment  Comment  Comment  (H): Data is abnormally high (L): Data is abnormally low (C): Corrected   Latest Reference Range & Units 12/22/23 09:40  Kappa free light chain 3.3 - 19.4 mg/L 493.6 (H)  Lambda free light chains 5.7 - 26.3 mg/L 19.9  Kappa, lambda light chain ratio 0.26 - 1.65  24.80 (H)  (H): Data is abnormally high  RADIOGRAPHIC STUDIES: I have personally reviewed the radiological images as listed and agreed with the findings in the report.  None relevant to review

## 2023-12-28 NOTE — Patient Instructions (Addendum)
 Medication Instructions:  Your physician has recommended you make the following change in your medication:   please increase midodrine  to 10 mg in AM and 5 mg in afternoon and 5 mg in the evening.  Labwork: None   Testing/Procedures: None   Follow-Up: Your physician recommends that you schedule a follow-up appointment in:  1 week nurse visit for BP and EKG (will review BP/HR Readings)  6-8 weeks   Any Other Special Instructions Will Be Listed Below (If Applicable).  If you need a refill on your cardiac medications before your next appointment, please call your pharmacy.

## 2023-12-28 NOTE — Progress Notes (Unsigned)
 Cardiology Office Note:  .   Date: 12/28/2023 ID:  Bradley Sheppard, DOB [birth year], MRN [id number] PCP: Rosamond Leta NOVAK, MD  [facility] Providers Cardiologist:  Jayson Sierras, MD    History of Present Illness: .   Bradley Sheppard is a 83 y.o. male with a PMH of CAD, s/p DES to LAD and RCA in 2003, PAF, HFpEF, moderate aortic valve stenosis, MALT lymphoma, anemia, MGUS, and CKD stage 3b, who presents today for scheduled follow-up.   Last seen by Dr. Sierras on March 04, 2023.  Leg edema had improved some, was noted that he had distal venous stasis and foot swelling/lymphedema.  He was relatively hypotensive although BP trends were better since starting low-dose midodrine .  Heart rates ranged from 90s to low 100s.  Was noted to be chronically short of breath that was stable, chronically fatigued. Palliative care was discussed.  05/28/2023 - Today he presents for scheduled follow-up.  Wife shows me his heart rate/BP readings that show a heart rate ranging from 67 -134, BP soft with BP dropping to 3 low readings of 85, 87, and 98.  Wife says that she spoke about palliative care with PCP, and has not heard anything back from PCPs office.  Patient denies any chest pain, palpitations, syncope, presyncope, dizziness, orthopnea, PND,  significant weight changes, acute bleeding, or claudication.  Leg edema appears to be stable.  Breathing and fatigue appear to be stable.   09/07/2023 - Presents today for follow-up with his wife and daughter.  Patient says he is doing well and daughter and wife state he is doing better since I last saw him.  Transferring better than he was previously.  Wife confirms that they have not heard back from palliative care at this point in will be interested in home based primary care. Not as fatigued when I last saw him. Denies any chest pain, shortness of breath, palpitations, syncope, presyncope, dizziness, orthopnea, PND, significant weight changes, acute bleeding, or  claudication. Leg swelling is improved from last office visit.  Shows me his blood pressure/heart rate/blood sugar reading log from home shows overall well-controlled readings.  BP is doing well per wife's report.  Hosopitalized July 2025 d/t cellultiis. Was on levaquin at the time with no improvement in symptoms. Was noted to have AKI on CKD stage 3b and mild drops in Hgb levels, but stable. No DVT noted, ABI unremarkable. Received IV lasix  and albumin  replacement. Was d/c to SNF for rehab.   12/28/2023 - Today he presents for follow-up. He is doing well. Now working with home health. BP is overall well controlled, no recent hypotensive episodes. Denies any chest pain, shortness of breath, syncope, presyncope, dizziness, orthopnea, PND, acute bleeding, or claudication. Leg swelling is stable. Does notice palpitations at times.   ROS: Negative.  See HPI. Studies Reviewed: SABRA    EKG:  EKG Interpretation Date/Time:  Tuesday December 28 2023 13:43:13 EDT Ventricular Rate:  118 PR Interval:    QRS Duration:  134 QT Interval:  408 QTC Calculation: 571 R Axis:   148  Text Interpretation: Critical Test Result: Long QTc Atrial fibrillation with rapid ventricular response Right axis deviation Non-specific intra-ventricular conduction block Cannot rule out Anteroseptal infarct , age undetermined T wave abnormality, consider inferior ischemia When compared with ECG of 10-Nov-2023 12:45, PREVIOUS ECG IS PRESENT Confirmed by Miriam Norris [id number]) on 12/31/2023 2:38:26 PM   Discussed and confirmed with DOD today (Dr. Mallipeddi) to be in A-flutter with RVR, unable to  measure Qtc.   Lower extremity venous Doppler left 11/2023: No evidence of left lower extremity DVT.  Echo 09/2022: 1. Left ventricular ejection fraction, by estimation, is 55 to 60%. The  left ventricle has normal function. The left ventricle has no regional  wall motion abnormalities. There is severe left ventricular hypertrophy.  Left  ventricular diastolic parameters   are indeterminate.   2. RV not well visualized. Grossly appears normal in size and function. .  Right ventricular systolic function was not well visualized. The right  ventricular size is not well visualized. Tricuspid regurgitation signal is  inadequate for assessing PA  pressure.   3. A small pericardial effusion is present. The pericardial effusion is  circumferential.   4. The mitral valve is normal in structure. No evidence of mitral valve  regurgitation. No evidence of mitral stenosis.   5. LVOT and AV Dopplers are technically limited, likely underestimated  values based on angle of acquisition. Consider limited study to reevaluate  aortic valve, appears to be at least some degree of stenosis. . The aortic  valve is tricuspid. There is  moderate calcification of the aortic valve. There is moderate thickening  of the aortic valve. Aortic valve regurgitation is not visualized.   6. The inferior vena cava is dilated in size with <50% respiratory  variability, suggesting right atrial pressure of 15 mmHg.  Risk Assessment/Calculations:    CHA2DS2-VASc Score =     This indicates a  % annual risk of stroke. The patient's score is based upon:    Physical Exam:   VS:  BP 112/70   Pulse 74   Ht 6' (1.829 m)   Wt 220 lb (99.8 kg)   SpO2 98%   BMI 29.84 kg/m    Wt Readings from Last 3 Encounters:  12/28/23 220 lb (99.8 kg)  11/14/23 228 lb 6.3 oz (103.6 kg)  09/07/23 215 lb (97.5 kg)    GEN: Well nourished, well developed in no acute distress, sitting in wheelchair NECK: No JVD; No carotid bruits CARDIAC: S1/S2, irregularly irregular rhythm, fast rate, no murmurs, no rubs, no gallops.  RESPIRATORY:  Clear to auscultation without rales, wheezing or rhonchi  EXTREMITIES:  Nonpitting edema to BLE (L >R) from previous visit,  No deformity. 1+ pulses to bilateral PT. 2+ radial pulses bilaterally.  ASSESSMENT AND PLAN: .    HFpEF, leg edema,  venous insufficiency Stage C, NYHA class II-III symptoms.  Leg edema is stable per  wife's report. Echo 09/2022 showed EF 55-60%. Metoprolol  has been held due to lower BP's. Log shows overall well controlled readings. No DVT's seen on doppler study. Continue current medication regimen. Low sodium diet, fluid restriction <2L, and daily weights encouraged. Educated to contact our office for weight gain of 2 lbs overnight or 5 lbs in one week. Recommended leg elevation and compression stockings to improve pedal edema and for signs/symptoms of PVD along bilateral feet. GDMT limited at this time - will increase midodrine  to be able to get him rate controlled - see below. No other medication changes at this time. Care and ED precautions discussed.    PAF/A-flutter with RVR, tachycardia, hx of hypotension Does admit to some palpitations at times.  EKG today reviewed with DOD (Dr. Stacia) in office and patient found to be in a flutter with RVR.  Needs better heart rate control and will increase midodrine  in order to add back metoprolol  to his medication regimen. Will increase midodrine  to 10 mg in AM, 5  mg in afternoon and 5 mg in the evening. No other medication changes at this time. Has declined AC d/t hx of rectal bleeding. Not a candidate for DCCV. Care and ED precautions discussed. Will bring him back in 1 week for a nurse visit to recheck his BP and EKG and evaluate his BP and HR readings.   CAD, s/p DES to LAD and RCA in 2003 Stable with no anginal symptoms. No indication for ischemic evaluation. Continue aspirin , rosuvastatin , and NTG PRN. Heart healthy diet encouraged. LDL elevated 06/2023. Crestor  was previously increased.  Orders have been previously given to recheck FLP in 2 to 3 months.  If not addressed by next office visit, plan to address this.  Moderate aortic valve stenosis Hx of moderate AV stenosis in past year. Most recent Echo showed AV gradient of 14 mmHg. Will continue to follow  conservatively.   5. Pericardial effusion TTE 09/2022 revealed small pericardial effusion, no evidence of cardiac tamponade. Pt denies any red flag symptoms. Will continue to monitor at this time.   6. CKD stage 3b Most recent labs obtained showed sCr at 1.84  with eGFR at 35. Has upcoming labs scheduled with Oncology. Avoid nephrotoxic agents. Continue to follow with PCP.   7. MGUS, anemia, [name] Patient has known diagnoses of stage IV MALT lymphoma, MGUS, and normocytic anemia. Currently following Oncology. Fatigue and weakness have improved, sitting in a wheelchair for office visit, more talkative that previously..  Advance care planning discussed.  Discussed/reviewed goals of care and came to shared medical decision that we will place referral to palliative care. Will consult [facility] for more assistance.   I spent a total duration of 45 minutes reviewing prior notes, reviewing outside records including  labs, EKG today, face-to-face counseling of medical condition, pathophysiology, evaluation, management, and documenting the findings in the note.   Dispo: Follow-up with me or APP in 6-8 weeks or sooner if anything changes.   Signed, Almarie Crate, NP

## 2023-12-29 ENCOUNTER — Inpatient Hospital Stay: Admitting: Oncology

## 2023-12-29 VITALS — BP 101/64 | HR 87 | Temp 97.9°F | Resp 18

## 2023-12-29 DIAGNOSIS — D508 Other iron deficiency anemias: Secondary | ICD-10-CM

## 2023-12-29 DIAGNOSIS — D472 Monoclonal gammopathy: Secondary | ICD-10-CM | POA: Diagnosis not present

## 2023-12-29 DIAGNOSIS — C884 Extranodal marginal zone b-cell lymphoma of mucosa-associated lymphoid tissue (malt-lymphoma) not having achieved remission: Secondary | ICD-10-CM | POA: Diagnosis not present

## 2023-12-29 NOTE — Assessment & Plan Note (Addendum)
 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  MYD 88 negative Patient currently has a mild anemia but no other symptoms No B symptoms  - Continue to monitor at this time - Can consider single agent rituximab if patient is of better functional status, has progression of disease with cytopenias or B symptoms  Return to clinic in 3 months with labs

## 2023-12-29 NOTE — Assessment & Plan Note (Signed)
 Patient has M spike that has been increasing recently.  In the past with subsequent up from 1.4-3.6 He also has worsening kappa light chains with increasing ratio  - Discussed risk versus benefits with the patient.  Discussed that he will need bone marrow biopsy for diagnosis and further treatment. - Even though MALT lymphoma can present with immunoglobulin elevation, it is more common with IgM and it appears like patient has IgG. - Patient and family are not very motivated to do bone marrow biopsy at this time.  They would like to repeat labs in 3 months and discuss at that time.  Return to clinic in 3 months with labs

## 2023-12-29 NOTE — Patient Instructions (Addendum)

## 2023-12-29 NOTE — Assessment & Plan Note (Signed)
 Iron  panel within normal limits today

## 2024-01-05 DIAGNOSIS — E119 Type 2 diabetes mellitus without complications: Secondary | ICD-10-CM | POA: Diagnosis not present

## 2024-01-05 DIAGNOSIS — I10 Essential (primary) hypertension: Secondary | ICD-10-CM | POA: Diagnosis not present

## 2024-01-05 DIAGNOSIS — N184 Chronic kidney disease, stage 4 (severe): Secondary | ICD-10-CM | POA: Diagnosis not present

## 2024-01-05 DIAGNOSIS — C884 Extranodal marginal zone b-cell lymphoma of mucosa-associated lymphoid tissue (malt-lymphoma) not having achieved remission: Secondary | ICD-10-CM | POA: Diagnosis not present

## 2024-01-05 DIAGNOSIS — R52 Pain, unspecified: Secondary | ICD-10-CM | POA: Diagnosis not present

## 2024-01-05 DIAGNOSIS — I480 Paroxysmal atrial fibrillation: Secondary | ICD-10-CM | POA: Diagnosis not present

## 2024-01-05 DIAGNOSIS — Z299 Encounter for prophylactic measures, unspecified: Secondary | ICD-10-CM | POA: Diagnosis not present

## 2024-01-06 ENCOUNTER — Ambulatory Visit: Attending: Internal Medicine

## 2024-01-06 VITALS — BP 108/70 | HR 121

## 2024-01-06 DIAGNOSIS — I10 Essential (primary) hypertension: Secondary | ICD-10-CM | POA: Diagnosis not present

## 2024-01-06 MED ORDER — MIDODRINE HCL 10 MG PO TABS: 10.0000 mg | ORAL_TABLET | Freq: Three times a day (TID) | ORAL | 3 refills | Status: DC

## 2024-01-06 MED ORDER — METOPROLOL TARTRATE 25 MG PO TABS: 12.5000 mg | ORAL_TABLET | Freq: Three times a day (TID) | ORAL | 3 refills | Status: AC | PRN

## 2024-01-06 NOTE — Patient Instructions (Signed)
 Medication Instructions:  Your physician has recommended you make the following change in your medication:  Please Increase Midodrine  to 10 Mg three times daily  Please start Metoprolol  Tartrate 12.5 Mg three times a day as needed for Palpitations, Heart rate greater than 120 and systolic Blood pressure greater than 120   Labwork: none  Testing/Procedures: None   Follow-Up: Your physician recommends that you schedule a follow-up appointment in: As scheduled   Any Other Special Instructions Will Be Listed Below (If Applicable).  If you need a refill on your cardiac medications before your next appointment, please call your pharmacy.

## 2024-01-06 NOTE — Progress Notes (Signed)
 Patient here for nurse visit per peck  Patient presents well just as he states EKG and vitals preforms  Showed EKG and vitals to provider and she suggested the following.  Patient informed and verbalized understanding of plan. New rx's sent to pharmacy will have front office call and schedule 1 week nurse visit.

## 2024-01-07 ENCOUNTER — Telehealth: Payer: Self-pay | Admitting: Nurse Practitioner

## 2024-01-07 MED ORDER — MIDODRINE HCL 10 MG PO TABS: 10.0000 mg | ORAL_TABLET | Freq: Three times a day (TID) | ORAL | 3 refills | Status: DC

## 2024-01-07 NOTE — Telephone Encounter (Signed)
 Informed pharmacy patient needs to take midodrine  10 Mg TID

## 2024-01-07 NOTE — Telephone Encounter (Signed)
 Pt c/o medication issue:  1. Name of Medication: midodrine  (PROAMATINE ) 10 MG tablet   2. How are you currently taking this medication (dosage and times per day)? N/A  3. Are you having a reaction (difficulty breathing--STAT)? No   4. What is your medication issue? Pharmacy calling to get clarification on medication instructions.

## 2024-01-12 DIAGNOSIS — E1165 Type 2 diabetes mellitus with hyperglycemia: Secondary | ICD-10-CM | POA: Diagnosis not present

## 2024-01-13 ENCOUNTER — Ambulatory Visit

## 2024-01-13 ENCOUNTER — Ambulatory Visit: Attending: Cardiology

## 2024-01-13 ENCOUNTER — Ambulatory Visit: Payer: Self-pay | Admitting: Nurse Practitioner

## 2024-01-13 ENCOUNTER — Encounter: Payer: Self-pay | Admitting: Nurse Practitioner

## 2024-01-13 VITALS — BP 118/62 | HR 73 | Wt 229.0 lb

## 2024-01-13 DIAGNOSIS — I4892 Unspecified atrial flutter: Secondary | ICD-10-CM

## 2024-01-13 NOTE — Addendum Note (Signed)
 Addended by: JOHNNYE LITTIE HERO on: 01/13/2024 02:22 PM   Modules accepted: Level of Service

## 2024-01-13 NOTE — Progress Notes (Signed)
 Patient here for EKG and vitals. Medications current and up to date. Reported no symptoms. Scanned BP log into chart for Bradley Sheppard to see. Advised patient once EKG has been reviewed by provider will reach out with her recommendations. Patient verbalized understanding

## 2024-01-17 ENCOUNTER — Telehealth: Payer: Self-pay

## 2024-01-18 ENCOUNTER — Telehealth: Payer: Self-pay | Admitting: Nurse Practitioner

## 2024-01-18 MED ORDER — TORSEMIDE 20 MG PO TABS: 20.0000 mg | ORAL_TABLET | Freq: Every day | ORAL | 3 refills | Status: AC

## 2024-01-18 MED ORDER — TORSEMIDE 20 MG PO TABS: 20.0000 mg | ORAL_TABLET | Freq: Every day | ORAL | 1 refills | Status: DC

## 2024-01-18 NOTE — Telephone Encounter (Signed)
 Pt's medication was sent to pt's pharmacy as requested. Confirmation received.

## 2024-01-18 NOTE — Telephone Encounter (Signed)
 Metoprolol  Take 0.5 tablets (12.5 mg total) by mouth 3 (three) times daily as needed (for Palpitations, Heart rate greater than 120 and systolic blood pressure greater than 120).   Patient states since it was last changed he has not met parameters to have to take it.

## 2024-01-18 NOTE — Telephone Encounter (Signed)
 Spoke with patient daughter regarding this matter and she states they are not comfortable at this time with changing lopressor  again. They are worried if patient Increases his BP will continue to drop low as it has did in the past.  Patient daughter feels this would be best communicated in the office with the provider.   I spoke with peck regarding this and she states it is fine to wait to make any changes until patient is seen in the office.  Patient daughter informed and verbalized understanding of plan.

## 2024-01-18 NOTE — Telephone Encounter (Signed)
 Will forward to [place].

## 2024-01-18 NOTE — Telephone Encounter (Signed)
 Patient is calling for results to an EKG he had last week.

## 2024-01-18 NOTE — Telephone Encounter (Signed)
*  STAT* If patient is at the pharmacy, call can be transferred to refill team.   1. Which medications need to be refilled? (please list name of each medication and dose if known) torsemide  (DEMADEX ) 20 MG tablet    2. Would you like to learn more about the convenience, safety, & potential cost savings by using the [facility]?   3. Are you open to using the [facility] (Type [facility].  ).   4. Which pharmacy/location (including street and city if local pharmacy) is medication to be sent to? [facility] - [place], [place] - [address]    5. Do they need a 30 day or 90 day supply? 30 day Patient states he only has 1 pill.

## 2024-01-21 DIAGNOSIS — J849 Interstitial pulmonary disease, unspecified: Secondary | ICD-10-CM | POA: Diagnosis not present

## 2024-01-21 DIAGNOSIS — I509 Heart failure, unspecified: Secondary | ICD-10-CM | POA: Diagnosis not present

## 2024-01-21 DIAGNOSIS — R5381 Other malaise: Secondary | ICD-10-CM | POA: Diagnosis not present

## 2024-02-07 ENCOUNTER — Ambulatory Visit: Admitting: Nurse Practitioner

## 2024-02-20 DIAGNOSIS — J849 Interstitial pulmonary disease, unspecified: Secondary | ICD-10-CM | POA: Diagnosis not present

## 2024-02-20 DIAGNOSIS — R5381 Other malaise: Secondary | ICD-10-CM | POA: Diagnosis not present

## 2024-02-22 ENCOUNTER — Ambulatory Visit: Admitting: Nurse Practitioner

## 2024-02-28 ENCOUNTER — Encounter: Payer: Self-pay | Admitting: Cardiology

## 2024-02-28 ENCOUNTER — Ambulatory Visit: Attending: Cardiology | Admitting: Cardiology

## 2024-02-28 VITALS — BP 110/78 | HR 112 | Ht 72.0 in | Wt 223.6 lb

## 2024-02-28 DIAGNOSIS — I480 Paroxysmal atrial fibrillation: Secondary | ICD-10-CM

## 2024-02-28 DIAGNOSIS — I5032 Chronic diastolic (congestive) heart failure: Secondary | ICD-10-CM

## 2024-02-28 DIAGNOSIS — Z79899 Other long term (current) drug therapy: Secondary | ICD-10-CM | POA: Diagnosis not present

## 2024-02-28 DIAGNOSIS — I2510 Atherosclerotic heart disease of native coronary artery without angina pectoris: Secondary | ICD-10-CM | POA: Diagnosis not present

## 2024-02-28 DIAGNOSIS — I350 Nonrheumatic aortic (valve) stenosis: Secondary | ICD-10-CM

## 2024-02-28 DIAGNOSIS — N184 Chronic kidney disease, stage 4 (severe): Secondary | ICD-10-CM | POA: Diagnosis not present

## 2024-02-28 MED ORDER — MIDODRINE HCL 10 MG PO TABS: 10.0000 mg | ORAL_TABLET | Freq: Two times a day (BID) | ORAL | Status: AC

## 2024-02-28 NOTE — Addendum Note (Signed)
 Addended by: Tamey Wanek M on: 02/28/2024 04:51 PM   Modules accepted: Orders

## 2024-02-28 NOTE — Progress Notes (Signed)
 Cardiology Office Note  Date: 02/28/2024   ID: Bradley Sheppard, DOB April 23, 1941, MRN [id number]  History of Present Illness: Bradley Sheppard is an 83 y.o. male last seen in August by Ms. Miriam NP, I reviewed her note.  He is here today with his wife for a follow-up visit.  He is in a wheelchair today, still has difficulty ambulating at home, although his wife indicates that he does seem somewhat stronger following home health nursing.  Reports fluctuating leg edema, no definite increase.  His weight is down about 6 pounds from September.  I went over his medications.  He is not using any as needed Lopressor , does not feel palpitations except briefly at times.  Remains on Demadex  20 mg daily with potassium supplement and also metolazone  2.5 mg twice weekly.  He is taking ProAmatine  10 mg twice daily to support blood pressure.  I reviewed his most recent lab work, creatinine 2.45 with GFR 25 and August.  Potassium normal at that time.  I reviewed his ECG today which shows atrial fibrillation at 110 bpm with left bundle branch block.  Physical Exam: VS:  BP 110/78 (BP Location: Left Arm, Patient Position: Sitting, Cuff Size: Large)   Pulse (!) 112   Ht 6' (1.829 m)   Wt 223 lb 9.6 oz (101.4 kg)   SpO2 97%   BMI 30.33 kg/m , BMI Body mass index is 30.33 kg/m.  Wt Readings from Last 3 Encounters:  02/28/24 223 lb 9.6 oz (101.4 kg)  01/13/24 229 lb (103.9 kg)  12/28/23 220 lb (99.8 kg)    General: Patient appears comfortable at rest.  In wheelchair. HEENT: Conjunctiva and lids normal. Neck: Supple, no elevated JVP or carotid bruits. Lungs: Decreased breath sounds, nonlabored breathing at rest. Cardiac: Irregularly irregular, 2/6 systolic murmur. Extremities: 2+ lower leg edema/lymphedema.  ECG:  An ECG dated 01/13/2024 was personally reviewed today and demonstrated:  Atypical atrial flutter with variable AV block, left bundle branch block.  Labwork: 11/19/2023: B Natriuretic  Peptide 215.0 12/22/2023: ALT 20; AST 28; BUN 91; Creatinine, Ser 2.45; Hemoglobin 12.6; Magnesium  2.4; Platelets 203; Potassium 3.8; Sodium 135     Component Value Date/Time   CHOL 183 06/17/2023 1320   TRIG 148 06/17/2023 1320   HDL 33 (L) 06/17/2023 1320   CHOLHDL 5.5 06/17/2023 1320   VLDL 30 06/17/2023 1320   LDLCALC 120 (H) 06/17/2023 1320   Other Studies Reviewed Today:  No interval cardiac testing for review today.  Assessment and Plan:  1.  CAD status post DES to the LAD and RCA in 2003.  No angina at current level of activity.  Continue aspirin  81 mg daily and as needed nitroglycerin .  He is also on Crestor  10 mg once a week.   2.  HFpEF, LVEF 55 to 60% with severe LVH by echocardiogram in May 2024.  Currently on Demadex  20 mg daily with potassium supplement and Zaroxolyn  2.5 mg twice weekly.  Renal function at borderline for initiating Kerendia.  Not a great candidate for SGLT2 inhibitors given immobility.   3.  Paroxysmal to persistent atrial fibrillation and atypical atrial flutter with CHA2DS2-VASc score of 5.  Not anticoagulated with history of recurrent rectal bleeding.  He is in atrial fibrillation today, heart rate 100-110 at rest.  Has not been able to tolerate standing AV nodal blockers given relative hypotension.  Not a candidate for digoxin.   4.  Degenerative calcific aortic stenosis, overall moderate range by testing over  the last year.  Most recent echocardiogram in May 2024 demonstrated mean AV gradient of approximately 14 mmHg and dimensionless index 0.32.  Likely paradoxical normal flow/low gradient.  Following conservatively at this point.   5.  CKD stage IV, last creatinine 2.45 with GFR 25.  Repeat BMET for next visit..   6.  Relative hypotension.  Currently on midodrine  10 mg twice daily.   Disposition:  Follow up 3 months.  Signed, Jayson JUDITHANN Sierras, M.D., F.A.C.C. [facility]

## 2024-02-28 NOTE — Addendum Note (Signed)
 Addended by: Adraine Biffle M on: 02/28/2024 03:08 PM   Modules accepted: Orders

## 2024-02-28 NOTE — Patient Instructions (Addendum)
 Medication Instructions:  Your physician recommends that you continue on your current medications as directed. Please refer to the Current Medication list given to you today.  Labwork: BMET in February 2026 before your next visit. Non-fasting [facility] ([address] [place]) or [facility]  Testing/Procedures: none  Follow-Up: Your physician recommends that you schedule a follow-up appointment in: 3-4 months  Any Other Special Instructions Will Be Listed Below (If Applicable).  If you need a refill on your cardiac medications before your next appointment, please call your pharmacy.

## 2024-02-28 NOTE — Addendum Note (Signed)
 Addended by: Trana Ressler M on: 02/28/2024 04:52 PM   Modules accepted: Orders

## 2024-03-08 ENCOUNTER — Telehealth: Payer: Self-pay | Admitting: Cardiology

## 2024-03-08 MED ORDER — NITROGLYCERIN 0.4 MG SL SUBL: 0.4000 mg | SUBLINGUAL_TABLET | SUBLINGUAL | 11 refills | Status: AC | PRN

## 2024-03-08 NOTE — Telephone Encounter (Signed)
*  STAT* If patient is at the pharmacy, call can be transferred to refill team.   1. Which medications need to be refilled? (please list name of each medication and dose if known)   nitroGLYCERIN  (NITROSTAT ) 0.4 MG SL tablet     2. Would you like to learn more about the convenience, safety, & potential cost savings by using the [facility]? No      3. Are you open to using the [facility] (Type [facility]. ). No    4. Which pharmacy/location (including street and city if local pharmacy) is medication to be sent to? [facility] Address: [address] [place], [place], [place] [address] Phone: [phone]   5. Do they need a 30 day or 90 day supply? 1 bottle

## 2024-03-08 NOTE — Telephone Encounter (Signed)
 Pt's medication was sent to pt's pharmacy as requested. Confirmation received.

## 2024-03-22 ENCOUNTER — Other Ambulatory Visit (HOSPITAL_COMMUNITY)
Admission: RE | Admit: 2024-03-22 | Discharge: 2024-03-22 | Disposition: A | Discharge status: Home / Self Care | Source: Ambulatory Visit | Attending: Cardiology | Admitting: Cardiology

## 2024-03-22 ENCOUNTER — Encounter: Payer: Self-pay | Admitting: Oncology

## 2024-03-22 ENCOUNTER — Other Ambulatory Visit (HOSPITAL_COMMUNITY)
Admission: RE | Admit: 2024-03-22 | Discharge: 2024-03-22 | Disposition: A | Discharge status: Home / Self Care | Source: Ambulatory Visit | Attending: Nephrology | Admitting: Nephrology

## 2024-03-22 ENCOUNTER — Ambulatory Visit: Payer: Self-pay | Admitting: Cardiology

## 2024-03-22 DIAGNOSIS — I480 Paroxysmal atrial fibrillation: Secondary | ICD-10-CM | POA: Diagnosis not present

## 2024-03-22 DIAGNOSIS — J849 Interstitial pulmonary disease, unspecified: Secondary | ICD-10-CM | POA: Diagnosis not present

## 2024-03-22 DIAGNOSIS — I2510 Atherosclerotic heart disease of native coronary artery without angina pectoris: Secondary | ICD-10-CM | POA: Insufficient documentation

## 2024-03-22 DIAGNOSIS — Z79899 Other long term (current) drug therapy: Secondary | ICD-10-CM | POA: Diagnosis not present

## 2024-03-22 DIAGNOSIS — R5381 Other malaise: Secondary | ICD-10-CM | POA: Diagnosis not present

## 2024-03-22 DIAGNOSIS — N184 Chronic kidney disease, stage 4 (severe): Secondary | ICD-10-CM | POA: Diagnosis not present

## 2024-03-22 DIAGNOSIS — I350 Nonrheumatic aortic (valve) stenosis: Secondary | ICD-10-CM | POA: Diagnosis not present

## 2024-03-22 DIAGNOSIS — I509 Heart failure, unspecified: Secondary | ICD-10-CM | POA: Diagnosis not present

## 2024-03-22 DIAGNOSIS — N189 Chronic kidney disease, unspecified: Secondary | ICD-10-CM | POA: Diagnosis present

## 2024-03-22 DIAGNOSIS — I5032 Chronic diastolic (congestive) heart failure: Secondary | ICD-10-CM | POA: Diagnosis not present

## 2024-03-22 LAB — RENAL FUNCTION PANEL
Albumin: 3.4 g/dL — ABNORMAL LOW (ref 3.5–5.0)
Anion gap: 10 (ref 5–15)
BUN: 63 mg/dL — ABNORMAL HIGH (ref 8–23)
CO2: 28 mmol/L (ref 22–32)
Calcium: 8.6 mg/dL — ABNORMAL LOW (ref 8.9–10.3)
Chloride: 101 mmol/L (ref 98–111)
Creatinine, Ser: 1.92 mg/dL — ABNORMAL HIGH (ref 0.61–1.24)
GFR, Estimated: 34 mL/min — ABNORMAL LOW (ref >60)
Glucose, Bld: 197 mg/dL — ABNORMAL HIGH (ref 70–99)
Phosphorus: 3.9 mg/dL (ref 2.5–4.6)
Potassium: 4.1 mmol/L (ref 3.5–5.1)
Sodium: 139 mmol/L (ref 135–145)

## 2024-03-22 LAB — PROTEIN / CREATININE RATIO, URINE
Creatinine, Urine: 98 mg/dL
Protein Creatinine Ratio: 0.08 mg/mg{creat} (ref 0.00–0.15)
Total Protein, Urine: 8 mg/dL

## 2024-03-22 LAB — BASIC METABOLIC PANEL WITH GFR
Anion gap: 9 (ref 5–15)
BUN: 64 mg/dL — ABNORMAL HIGH (ref 8–23)
CO2: 28 mmol/L (ref 22–32)
Calcium: 8.7 mg/dL — ABNORMAL LOW (ref 8.9–10.3)
Chloride: 101 mmol/L (ref 98–111)
Creatinine, Ser: 1.91 mg/dL — ABNORMAL HIGH (ref 0.61–1.24)
GFR, Estimated: 34 mL/min — ABNORMAL LOW (ref >60)
Glucose, Bld: 195 mg/dL — ABNORMAL HIGH (ref 70–99)
Potassium: 3.9 mmol/L (ref 3.5–5.1)
Sodium: 139 mmol/L (ref 135–145)

## 2024-03-22 LAB — CBC
HCT: 37 % — ABNORMAL LOW (ref 39.0–52.0)
Hemoglobin: 12.1 g/dL — ABNORMAL LOW (ref 13.0–17.0)
MCH: 30 pg (ref 26.0–34.0)
MCHC: 32.7 g/dL (ref 30.0–36.0)
MCV: 91.6 fL (ref 80.0–100.0)
Platelets: 277 K/uL (ref 150–400)
RBC: 4.04 MIL/uL — ABNORMAL LOW (ref 4.22–5.81)
RDW: 14.6 % (ref 11.5–15.5)
WBC: 8.9 K/uL (ref 4.0–10.5)
nRBC: 0 % (ref 0.0–0.2)

## 2024-03-23 LAB — PARATHYROID HORMONE, INTACT (NO CA): PTH: 149 pg/mL — ABNORMAL HIGH (ref 15–65)

## 2024-03-29 DIAGNOSIS — D472 Monoclonal gammopathy: Secondary | ICD-10-CM | POA: Diagnosis not present

## 2024-03-29 DIAGNOSIS — E1122 Type 2 diabetes mellitus with diabetic chronic kidney disease: Secondary | ICD-10-CM | POA: Diagnosis not present

## 2024-03-29 DIAGNOSIS — I5032 Chronic diastolic (congestive) heart failure: Secondary | ICD-10-CM | POA: Diagnosis not present

## 2024-03-29 DIAGNOSIS — N1832 Chronic kidney disease, stage 3b: Secondary | ICD-10-CM | POA: Diagnosis not present

## 2024-04-03 ENCOUNTER — Inpatient Hospital Stay: Attending: Oncology

## 2024-04-03 DIAGNOSIS — D649 Anemia, unspecified: Secondary | ICD-10-CM | POA: Diagnosis not present

## 2024-04-03 DIAGNOSIS — Z7984 Long term (current) use of oral hypoglycemic drugs: Secondary | ICD-10-CM | POA: Insufficient documentation

## 2024-04-03 DIAGNOSIS — Z7982 Long term (current) use of aspirin: Secondary | ICD-10-CM | POA: Diagnosis not present

## 2024-04-03 DIAGNOSIS — C884 Extranodal marginal zone b-cell lymphoma of mucosa-associated lymphoid tissue (malt-lymphoma) not having achieved remission: Secondary | ICD-10-CM | POA: Insufficient documentation

## 2024-04-03 DIAGNOSIS — D508 Other iron deficiency anemias: Secondary | ICD-10-CM

## 2024-04-03 DIAGNOSIS — D472 Monoclonal gammopathy: Secondary | ICD-10-CM

## 2024-04-03 DIAGNOSIS — R7689 Other specified abnormal immunological findings in serum: Secondary | ICD-10-CM | POA: Diagnosis not present

## 2024-04-03 DIAGNOSIS — Z79899 Other long term (current) drug therapy: Secondary | ICD-10-CM | POA: Insufficient documentation

## 2024-04-03 LAB — COMPREHENSIVE METABOLIC PANEL WITH GFR
ALT: 16 U/L (ref 0–44)
AST: 27 U/L (ref 15–41)
Albumin: 3.7 g/dL (ref 3.5–5.0)
Alkaline Phosphatase: 122 U/L (ref 38–126)
Anion gap: 12 (ref 5–15)
BUN: 66 mg/dL — ABNORMAL HIGH (ref 8–23)
CO2: 24 mmol/L (ref 22–32)
Calcium: 9 mg/dL (ref 8.9–10.3)
Chloride: 100 mmol/L (ref 98–111)
Creatinine, Ser: 1.9 mg/dL — ABNORMAL HIGH (ref 0.61–1.24)
GFR, Estimated: 34 mL/min — ABNORMAL LOW (ref >60)
Glucose, Bld: 216 mg/dL — ABNORMAL HIGH (ref 70–99)
Potassium: 4 mmol/L (ref 3.5–5.1)
Sodium: 136 mmol/L (ref 135–145)
Total Bilirubin: 0.9 mg/dL (ref 0.0–1.2)
Total Protein: 9.7 g/dL — ABNORMAL HIGH (ref 6.5–8.1)

## 2024-04-03 LAB — CBC WITH DIFFERENTIAL/PLATELET
Abs Immature Granulocytes: 0.03 K/uL (ref 0.00–0.07)
Basophils Absolute: 0.1 K/uL (ref 0.0–0.1)
Basophils Relative: 1 %
Eosinophils Absolute: 0.2 K/uL (ref 0.0–0.5)
Eosinophils Relative: 2 %
HCT: 38.8 % — ABNORMAL LOW (ref 39.0–52.0)
Hemoglobin: 12.7 g/dL — ABNORMAL LOW (ref 13.0–17.0)
Immature Granulocytes: 0 %
Lymphocytes Relative: 33 %
Lymphs Abs: 2.9 K/uL (ref 0.7–4.0)
MCH: 29.8 pg (ref 26.0–34.0)
MCHC: 32.7 g/dL (ref 30.0–36.0)
MCV: 91.1 fL (ref 80.0–100.0)
Monocytes Absolute: 0.5 K/uL (ref 0.1–1.0)
Monocytes Relative: 5 %
Neutro Abs: 5.2 K/uL (ref 1.7–7.7)
Neutrophils Relative %: 59 %
Platelets: 196 K/uL (ref 150–400)
RBC: 4.26 MIL/uL (ref 4.22–5.81)
RDW: 14.9 % (ref 11.5–15.5)
WBC: 8.9 K/uL (ref 4.0–10.5)
nRBC: 0 % (ref 0.0–0.2)

## 2024-04-03 LAB — IRON AND TIBC
Iron: 44 ug/dL — ABNORMAL LOW (ref 45–182)
Saturation Ratios: 20 % (ref 17.9–39.5)
TIBC: 218 ug/dL — ABNORMAL LOW (ref 250–450)
UIBC: 174 ug/dL

## 2024-04-03 LAB — VITAMIN B12: Vitamin B-12: 1140 pg/mL — ABNORMAL HIGH (ref 180–914)

## 2024-04-03 LAB — LACTATE DEHYDROGENASE: LDH: 203 U/L (ref 105–235)

## 2024-04-03 LAB — FOLATE: Folate: 10.1 ng/mL (ref >5.9)

## 2024-04-03 LAB — FERRITIN: Ferritin: 316 ng/mL (ref 24–336)

## 2024-04-04 LAB — KAPPA/LAMBDA LIGHT CHAINS
Kappa free light chain: 634.4 mg/L — ABNORMAL HIGH (ref 3.3–19.4)
Kappa, lambda light chain ratio: 31.72 — ABNORMAL HIGH (ref 0.26–1.65)
Lambda free light chains: 20 mg/L (ref 5.7–26.3)

## 2024-04-05 LAB — MULTIPLE MYELOMA PANEL, SERUM
Albumin SerPl Elph-Mcnc: 3.3 g/dL (ref 2.9–4.4)
Albumin/Glob SerPl: 0.6 — ABNORMAL LOW (ref 0.7–1.7)
Alpha 1: 0.2 g/dL (ref 0.0–0.4)
Alpha2 Glob SerPl Elph-Mcnc: 0.9 g/dL (ref 0.4–1.0)
B-Globulin SerPl Elph-Mcnc: 0.9 g/dL (ref 0.7–1.3)
Gamma Glob SerPl Elph-Mcnc: 4.2 g/dL — ABNORMAL HIGH (ref 0.4–1.8)
Globulin, Total: 6.2 g/dL — ABNORMAL HIGH (ref 2.2–3.9)
IgA: 74 mg/dL (ref 61–437)
IgG (Immunoglobin G), Serum: 5303 mg/dL — ABNORMAL HIGH (ref 603–1613)
IgM (Immunoglobulin M), Srm: 41 mg/dL (ref 15–143)
M Protein SerPl Elph-Mcnc: 3.8 g/dL — ABNORMAL HIGH
Total Protein ELP: 9.5 g/dL — ABNORMAL HIGH (ref 6.0–8.5)

## 2024-04-10 ENCOUNTER — Inpatient Hospital Stay: Admitting: Oncology

## 2024-04-11 ENCOUNTER — Inpatient Hospital Stay: Admitting: Hematology

## 2024-05-01 ENCOUNTER — Encounter: Payer: Self-pay | Admitting: *Deleted

## 2024-05-12 ENCOUNTER — Inpatient Hospital Stay: Attending: Oncology | Admitting: Oncology

## 2024-05-12 DIAGNOSIS — C884 Extranodal marginal zone b-cell lymphoma of mucosa-associated lymphoid tissue (malt-lymphoma) not having achieved remission: Secondary | ICD-10-CM | POA: Diagnosis not present

## 2024-05-12 DIAGNOSIS — D472 Monoclonal gammopathy: Secondary | ICD-10-CM

## 2024-05-12 NOTE — Progress Notes (Signed)
 " [facility] at [facility]  Hematology Follow-up Virtual Visit via Telephone Note  Bradley Leta NOVAK, MD  I connected withNAME@ on 05/12/2024 at  3:00 PM EST by telephone and verified that I am speaking with the correct person using two identifiers.  Location: Patient: Patient's residence Provider: Office at [facility]  REASON FOR FOLLOW-UP: MALT lymphoma  ASSESSMENT & PLAN:  Patient is a 84 y.o. 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  MYD 88 negative Patient currently has a mild anemia but no other symptoms No B symptoms   - Continue to monitor at this time - Can consider single agent rituximab if patient is of better functional status, has progression of disease with cytopenias or B symptoms   Return to clinic in 2 months with labs  MGUS (monoclonal gammopathy of unknown significance) Patient has M spike that has been increasing recently.  In the past with subsequent up from 1.4-3.8 He also has worsening kappa light chains with increasing ratio   - Discussed risk versus benefits with the patient.  Discussed that he will need bone marrow biopsy for diagnosis and further treatment. - Even though MALT lymphoma can present with immunoglobulin elevation, it is more common with IgM and it appears like patient has IgG. - Patient and family are not very motivated to do bone marrow biopsy at this time.  They would like to repeat labs in 2 months and discuss at that time. -We had an extensive discussion about goals of care and preferences, patient would like to wait at this time and do understand that disease may progress during this time.  Return to clinic in 2 months with labs  Other iron  deficiency anemia Iron  panel within normal limits today    Orders Placed This Encounter  Procedures   Ferritin    Standing Status:   Future    Expected Date:   07/10/2024    Expiration  Date:   10/08/2024   Folate    Standing Status:   Future    Expected Date:   07/10/2024    Expiration Date:   10/08/2024   Vitamin B12    Standing Status:   Future    Expected Date:   07/10/2024    Expiration Date:   10/08/2024   CBC with Differential/Platelet    Standing Status:   Future    Expected Date:   07/10/2024    Expiration Date:   10/08/2024   Comprehensive metabolic panel with GFR    Standing Status:   Future    Expected Date:   07/10/2024    Expiration Date:   10/08/2024   Multiple Myeloma Panel (SPEP&IFE w/QIG)    Standing Status:   Future    Expected Date:   07/10/2024    Expiration Date:   10/08/2024   Kappa/lambda light chains    Standing Status:   Future    Expected Date:   07/10/2024    Expiration Date:   10/08/2024   IgG, IgA, IgM    Standing Status:   Future    Expected Date:   07/10/2024    Expiration Date:   10/08/2024   Iron  and TIBC    Standing Status:   Future    Expected Date:   07/10/2024    Expiration Date:   10/08/2024   Lactate dehydrogenase    Standing Status:   Future  Expected Date:   07/10/2024    Expiration Date:   10/08/2024    I discussed the assessment and treatment plan with the patient. The patient was provided an opportunity to ask questions and all were answered. The patient agreed with the plan and demonstrated an understanding of the instructions.   The patient was advised to call back or seek an in-person evaluation if the symptoms worsen or if the condition fails to improve as anticipated.  I provided 30 minutes of non-face-to-face time during this encounter.   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 Creatinine 1.56. -06/12/2021: CBC with HGB at 9.1, MCV 94, normal WBC and PLT. Creatinine 1.4. -07/11/2021: SPEP: M spike: 1.4, IgG: 2224, IgM: 44, IgA: 74.  Free kappa light chain: 313.4, free  lambda light chain: 17.8, ratio: 17.61 -09/02/2021: Bone Marrow Biopsy.             -Pathology: Cellular marrow involved by non-Hodgkin B-cell lymphoma with  plasmacytic differentiation. The differential diagnosis MALT lymphoma (favored) and lymphoplasmacytic  lymphoma.              -MYD88: Negative             -Cytogenetics: 46, XY             -Myeloma FISH panel: negative -Low-grade lymphoma FISH: Negative for BCL6 rearrangement, MALT1 rearrangement, t(11;14) and t(14;18). Normal MM FISH panel - 12/22/2023: SPEP: M spike: 3.6, free kappa light chain: 493.6, free lambda light chain: 19.9, ratio: 24.8, LDH: Normal   Current Treatment: Close monitoring  INTERVAL HISTORY:  I discussed the limitations, risks, security and privacy concerns of performing an evaluation and management service by telephone and the availability of in person appointments. I also discussed with the patient that there may be a patient responsible charge related to this service. The patient expressed understanding and agreed to proceed.   Bradley Sheppard 84 y.o. male following for MALT lymphoma and IgG MGUS.  Patient reported that he has bilateral leg pains today and that he uses some supplements and oxycodone  for the same.  He also elevates his legs.  He is accompanied by his wife and daughter over the phone today.  We discussed the lab results in detail and the need for bone marrow biopsy.  Patient reported that he does not want to do the biopsy at this time.  Risk versus benefits explained in detail including disease progression but considering patient's comorbidities and functional status, patient will not qualify for chemotherapy treatment at this time but will need a reevaluation at her visit next time.  Patient reported no other complaints.  Expressed understanding of the above discussion.   I have reviewed the past medical history, past surgical history, social history and family history with the patient    ALLERGIES:  is allergic to nsaids, atorvastatin, cefuroxime axetil, and prednisone.  MEDICATIONS:  Current Outpatient Medications  Medication Sig Dispense Refill   calcitRIOL (ROCALTROL) 0.25 MCG capsule Take 0.25 mcg by mouth 3 (three) times a week.     COMFORT EZ PEN NEEDLES 31G X 5 MM MISC daily.     Lancets (ONETOUCH DELICA PLUS LANCET33G) MISC Apply topically 2 (two) times daily.     ONETOUCH VERIO test strip 2 (two) times daily.     acetaminophen  (TYLENOL ) 500 MG tablet Take 1,000 mg by mouth every  6 (six) hours as needed for moderate pain.     aspirin  EC 81 MG tablet Take 1 tablet (81 mg total) by mouth daily with breakfast. Swallow whole. 30 tablet 12   Blood Pressure Monitoring (BLOOD PRESSURE CUFF) MISC 1 each by Does not apply route as directed. Dx: hypertension & heart failure 1 each 0   Calcium  Polycarbophil (FIBER-LAX PO) Take 1 capsule by mouth daily.     ferrous sulfate  325 (65 FE) MG tablet Take 1 tablet (325 mg total) by mouth 2 (two) times daily with a meal. 30 tablet 3   FT SENNA-S 8.6-50 MG tablet Take 1 tablet by mouth at bedtime.     glipiZIDE  (GLUCOTROL  XL) 10 MG 24 hr tablet Take 1 tablet (10 mg total) by mouth daily with breakfast. (Patient taking differently: Take 10 mg by mouth 2 (two) times daily.)     insulin  aspart (NOVOLOG ) 100 UNIT/ML injection Inject 8 Units into the skin at bedtime.     LANTUS  SOLOSTAR 100 UNIT/ML Solostar Pen Inject 12 Units into the skin daily.     LORazepam  (ATIVAN ) 0.5 MG tablet Take 1 tablet (0.5 mg total) by mouth at bedtime as needed for anxiety or sleep. 3 tablet 0   metolazone  (ZAROXOLYN ) 5 MG tablet Take 2.5 mg by mouth 2 (two) times a week.     metoprolol  tartrate (LOPRESSOR ) 25 MG tablet Take 0.5 tablets (12.5 mg total) by mouth 3 (three) times daily as needed (for Palpitations, Heart rate greater than 120 and systolic blood pressure greater than 120). 180 tablet 3   midodrine  (PROAMATINE ) 10 MG tablet Take 1 tablet (10 mg  total) by mouth 2 (two) times daily.     nitroGLYCERIN  (NITROSTAT ) 0.4 MG SL tablet Place 1 tablet (0.4 mg total) under the tongue every 5 (five) minutes as needed for chest pain. 25 tablet 11   oxyCODONE  (OXY IR/ROXICODONE ) 5 MG immediate release tablet Take 0.5-1 tablets (2.5-5 mg total) by mouth every 6 (six) hours as needed for severe pain. 5 tablet 0   polyethylene glycol (MIRALAX  / GLYCOLAX ) 17 g packet Take 17 g by mouth 2 (two) times daily as needed for mild constipation. 14 each 0   potassium chloride  (KLOR-CON ) 10 MEQ tablet Take 20 mEq by mouth daily as needed. Take with Metalozone     rOPINIRole  (REQUIP ) 0.25 MG tablet Take 0.25 mg by mouth at bedtime.     rosuvastatin  (CRESTOR ) 10 MG tablet 1 (10 mg tablet) every Wednesday 90 tablet 1   tamsulosin  (FLOMAX ) 0.4 MG CAPS capsule Take 1 capsule (0.4 mg total) by mouth daily. 90 capsule 1   torsemide  (DEMADEX ) 20 MG tablet Take 1 tablet (20 mg total) by mouth daily. 90 tablet 3   vitamin B-12 (CYANOCOBALAMIN ) 500 MCG tablet Take 500 mcg by mouth daily.     vitamin C  (ASCORBIC ACID ) 500 MG tablet Take 500 mg by mouth at bedtime.     No current facility-administered medications for this visit.     PHYSICAL EXAMINATION: Deferred for virtual visit  LABORATORY DATA:  I have reviewed the data as listed  Lab Results  Component Value Date   WBC 8.9 04/03/2024   NEUTROABS 5.2 04/03/2024   HGB 12.7 (L) 04/03/2024   HCT 38.8 (L) 04/03/2024   MCV 91.1 04/03/2024   PLT 196 04/03/2024      Component Value Date/Time   NA 136 04/03/2024 1425   NA 138 10/30/2022 1406   K 4.0 04/03/2024 1425  CL 100 04/03/2024 1425   CO2 24 04/03/2024 1425   GLUCOSE 216 (H) 04/03/2024 1425   BUN 66 (H) 04/03/2024 1425   BUN 67 (H) 10/30/2022 1406   CREATININE 1.90 (H) 04/03/2024 1425   CALCIUM  9.0 04/03/2024 1425   PROT 9.7 (H) 04/03/2024 1425   PROT 7.5 12/09/2021 0948   ALBUMIN  3.7 04/03/2024 1425   ALBUMIN  3.7 12/09/2021 0948   AST 27  04/03/2024 1425   ALT 16 04/03/2024 1425   ALKPHOS 122 04/03/2024 1425   BILITOT 0.9 04/03/2024 1425   BILITOT 0.4 12/09/2021 0948   GFRNONAA 34 (L) 04/03/2024 1425   GFRAA  01/06/2009 0500    >60        The eGFR has been calculated using the MDRD equation. This calculation has not been validated in all clinical situations. eGFR's persistently <60 mL/min signify possible Chronic Kidney Disease.    Latest Reference Range & Units 04/03/24 14:26  Iron  45 - 182 ug/dL 44 (L)  UIBC ug/dL 825  TIBC 749 - 549 ug/dL 781 (L)  Saturation Ratios 17.9 - 39.5 % 20  Ferritin 24 - 336 ng/mL 316  Folate >5.9 ng/mL 10.1  Vitamin B12 180 - 914 pg/mL 1,140 (H)  (L): Data is abnormally low (H): Data is abnormally high   Latest Reference Range & Units 04/03/24 14:26  Total Protein ELP 6.0 - 8.5 g/dL 9.5 (H) (C)  Albumin  SerPl Elph-Mcnc 2.9 - 4.4 g/dL 3.3 (C)  Albumin /Glob SerPl 0.7 - 1.7  0.6 (L) (C)  Alpha2 Glob SerPl Elph-Mcnc 0.4 - 1.0 g/dL 0.9 (C)  Alpha 1 0.0 - 0.4 g/dL 0.2 (C)  Gamma Glob SerPl Elph-Mcnc 0.4 - 1.8 g/dL 4.2 (H) (C)  M Protein SerPl Elph-Mcnc Not Observed g/dL 3.8 (H) (C)  IFE 1  Comment ! (C)  Globulin, Total 2.2 - 3.9 g/dL 6.2 (H) (C)  B-Globulin SerPl Elph-Mcnc 0.7 - 1.3 g/dL 0.9 (C)  IgG (Immunoglobin G), Serum 603 - 1,613 mg/dL 4,696 (H)  IgM (Immunoglobulin M), Srm 15 - 143 mg/dL 41  IgA 61 - 562 mg/dL 74  (H): Data is abnormally high (L): Data is abnormally low !: Data is abnormal (C): Corrected   Latest Reference Range & Units 04/03/24 14:25  Kappa free light chain 3.3 - 19.4 mg/L 634.4 (H)  Lambda free light chains 5.7 - 26.3 mg/L 20.0  Kappa, lambda light chain ratio 0.26 - 1.65  31.72 (H)  (H): Data is abnormally high  RADIOGRAPHIC STUDIES: I have personally reviewed the radiological images as listed and agreed with the findings in the report. No results found.    "

## 2024-05-15 ENCOUNTER — Inpatient Hospital Stay: Admitting: Physician Assistant

## 2024-06-12 ENCOUNTER — Ambulatory Visit: Admitting: Cardiology

## 2024-07-03 ENCOUNTER — Inpatient Hospital Stay: Attending: Oncology

## 2024-07-10 ENCOUNTER — Inpatient Hospital Stay: Admitting: Oncology
